# Patient Record
Sex: Female | Born: 1945
Health system: Southern US, Community
[De-identification: ages and names within clinical notes are randomized; demographics above are authoritative.]

## PROBLEM LIST (undated history)

## (undated) DIAGNOSIS — K449 Diaphragmatic hernia without obstruction or gangrene: Secondary | ICD-10-CM

## (undated) DIAGNOSIS — M7710 Lateral epicondylitis, unspecified elbow: Secondary | ICD-10-CM

## (undated) DIAGNOSIS — D1803 Hemangioma of intra-abdominal structures: Secondary | ICD-10-CM

## (undated) DIAGNOSIS — Z5189 Encounter for other specified aftercare: Secondary | ICD-10-CM

## (undated) DIAGNOSIS — E785 Hyperlipidemia, unspecified: Secondary | ICD-10-CM

## (undated) DIAGNOSIS — T7840XA Allergy, unspecified, initial encounter: Secondary | ICD-10-CM

## (undated) DIAGNOSIS — S0992XA Unspecified injury of nose, initial encounter: Secondary | ICD-10-CM

## (undated) DIAGNOSIS — I209 Angina pectoris, unspecified: Secondary | ICD-10-CM

## (undated) DIAGNOSIS — Z86010 Personal history of colonic polyps: Secondary | ICD-10-CM

## (undated) DIAGNOSIS — H269 Unspecified cataract: Secondary | ICD-10-CM

## (undated) DIAGNOSIS — K5792 Diverticulitis of intestine, part unspecified, without perforation or abscess without bleeding: Secondary | ICD-10-CM

## (undated) DIAGNOSIS — K219 Gastro-esophageal reflux disease without esophagitis: Secondary | ICD-10-CM

## (undated) DIAGNOSIS — G4733 Obstructive sleep apnea (adult) (pediatric): Secondary | ICD-10-CM

## (undated) DIAGNOSIS — G4730 Sleep apnea, unspecified: Secondary | ICD-10-CM

## (undated) DIAGNOSIS — B2790 Infectious mononucleosis, unspecified without complication: Secondary | ICD-10-CM

## (undated) DIAGNOSIS — Z87898 Personal history of other specified conditions: Secondary | ICD-10-CM

## (undated) DIAGNOSIS — K589 Irritable bowel syndrome without diarrhea: Secondary | ICD-10-CM

## (undated) DIAGNOSIS — I2510 Atherosclerotic heart disease of native coronary artery without angina pectoris: Secondary | ICD-10-CM

## (undated) DIAGNOSIS — Z9889 Other specified postprocedural states: Secondary | ICD-10-CM

## (undated) DIAGNOSIS — W19XXXA Unspecified fall, initial encounter: Secondary | ICD-10-CM

## (undated) DIAGNOSIS — L719 Rosacea, unspecified: Secondary | ICD-10-CM

## (undated) DIAGNOSIS — J309 Allergic rhinitis, unspecified: Secondary | ICD-10-CM

## (undated) DIAGNOSIS — E061 Subacute thyroiditis: Secondary | ICD-10-CM

## (undated) DIAGNOSIS — K8020 Calculus of gallbladder without cholecystitis without obstruction: Secondary | ICD-10-CM

## (undated) DIAGNOSIS — D649 Anemia, unspecified: Secondary | ICD-10-CM

## (undated) DIAGNOSIS — T4145XA Adverse effect of unspecified anesthetic, initial encounter: Secondary | ICD-10-CM

## (undated) DIAGNOSIS — Z8679 Personal history of other diseases of the circulatory system: Secondary | ICD-10-CM

## (undated) DIAGNOSIS — D72819 Decreased white blood cell count, unspecified: Secondary | ICD-10-CM

## (undated) HISTORY — DX: Subacute thyroiditis: E06.1

## (undated) HISTORY — DX: Anemia, unspecified: D64.9

## (undated) HISTORY — DX: Allergic rhinitis, unspecified: J30.9

## (undated) HISTORY — PX: OTHER SURGICAL HISTORY: SHX169

## (undated) HISTORY — DX: Unspecified cataract: H26.9

## (undated) HISTORY — DX: Rosacea, unspecified: L71.9

## (undated) HISTORY — DX: Irritable bowel syndrome, unspecified: K58.9

## (undated) HISTORY — DX: Personal history of other specified conditions: Z87.898

## (undated) HISTORY — DX: Unspecified injury of nose, initial encounter: S09.92XA

## (undated) HISTORY — PX: TONSILLECTOMY: SHX5217

## (undated) HISTORY — PX: DILATION AND CURETTAGE OF UTERUS: SHX78

## (undated) HISTORY — DX: Diaphragmatic hernia without obstruction or gangrene: K44.9

## (undated) HISTORY — DX: Allergy, unspecified, initial encounter: T78.40XA

## (undated) HISTORY — DX: Encounter for other specified aftercare: Z51.89

## (undated) HISTORY — PX: NASAL SEPTOPLASTY W/ TURBINOPLASTY: SHX2070

## (undated) HISTORY — DX: Sleep apnea, unspecified: G47.30

## (undated) HISTORY — PX: REPLACEMENT TOTAL KNEE: SUR1224

## (undated) HISTORY — PX: DENTAL SURGERY: SHX609

## (undated) HISTORY — DX: Other specified postprocedural states: Z98.890

## (undated) HISTORY — DX: Personal history of colonic polyps: Z86.010

## (undated) HISTORY — DX: Diverticulitis of intestine, part unspecified, without perforation or abscess without bleeding: K57.92

## (undated) HISTORY — PX: KNEE ARTHROSCOPY: SUR90

## (undated) HISTORY — DX: Hemangioma of intra-abdominal structures: D18.03

## (undated) HISTORY — DX: Infectious mononucleosis, unspecified without complication: B27.90

## (undated) HISTORY — DX: Personal history of other diseases of the circulatory system: Z86.79

## (undated) HISTORY — PX: ABDOMINAL HYSTERECTOMY: SHX81

## (undated) HISTORY — DX: Gastro-esophageal reflux disease without esophagitis: K21.9

## (undated) HISTORY — DX: Decreased white blood cell count, unspecified: D72.819

## (undated) HISTORY — DX: Hyperlipidemia, unspecified: E78.5

## (undated) HISTORY — DX: Calculus of gallbladder without cholecystitis without obstruction: K80.20

## (undated) HISTORY — DX: Obstructive sleep apnea (adult) (pediatric): G47.33

## (undated) HISTORY — DX: Lateral epicondylitis, unspecified elbow: M77.10

---
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
## 1991-12-06 HISTORY — PX: ABDOMINAL HYSTERECTOMY: SHX81

## 1998-09-12 ENCOUNTER — Ambulatory Visit: Admission: RE | Admit: 1998-09-12 | Discharge: 1998-09-12 | Payer: Self-pay | Admitting: Internal Medicine

## 1999-12-08 ENCOUNTER — Ambulatory Visit: Admission: RE | Admit: 1999-12-08 | Discharge: 1999-12-08 | Payer: Self-pay | Admitting: Otolaryngology

## 2000-03-05 DIAGNOSIS — K449 Diaphragmatic hernia without obstruction or gangrene: Secondary | ICD-10-CM

## 2000-03-05 HISTORY — DX: Diaphragmatic hernia without obstruction or gangrene: K44.9

## 2000-03-22 ENCOUNTER — Encounter (INDEPENDENT_AMBULATORY_CARE_PROVIDER_SITE_OTHER): Payer: Self-pay | Admitting: Specialist

## 2000-03-22 ENCOUNTER — Ambulatory Visit (HOSPITAL_COMMUNITY): Admission: RE | Admit: 2000-03-22 | Discharge: 2000-03-22 | Payer: Self-pay | Admitting: Gastroenterology

## 2000-05-05 HISTORY — PX: NASAL SEPTOPLASTY W/ TURBINOPLASTY: SHX2070

## 2001-02-16 ENCOUNTER — Encounter: Payer: Self-pay | Admitting: Vascular Surgery

## 2001-02-16 ENCOUNTER — Ambulatory Visit (HOSPITAL_COMMUNITY): Admission: RE | Admit: 2001-02-16 | Discharge: 2001-02-17 | Payer: Self-pay | Admitting: Vascular Surgery

## 2001-04-23 ENCOUNTER — Other Ambulatory Visit: Admission: RE | Admit: 2001-04-23 | Discharge: 2001-04-23 | Payer: Self-pay | Admitting: Obstetrics and Gynecology

## 2001-12-05 DIAGNOSIS — Z87898 Personal history of other specified conditions: Secondary | ICD-10-CM

## 2001-12-05 HISTORY — DX: Personal history of other specified conditions: Z87.898

## 2002-04-05 ENCOUNTER — Encounter: Payer: Self-pay | Admitting: Emergency Medicine

## 2002-04-05 ENCOUNTER — Observation Stay (HOSPITAL_COMMUNITY): Admission: EM | Admit: 2002-04-05 | Discharge: 2002-04-06 | Payer: Self-pay | Admitting: Emergency Medicine

## 2002-10-28 ENCOUNTER — Ambulatory Visit (HOSPITAL_COMMUNITY): Admission: RE | Admit: 2002-10-28 | Discharge: 2002-10-28 | Payer: Self-pay | Admitting: *Deleted

## 2003-09-08 ENCOUNTER — Encounter: Payer: Self-pay | Admitting: Otolaryngology

## 2003-09-08 ENCOUNTER — Ambulatory Visit (HOSPITAL_COMMUNITY): Admission: RE | Admit: 2003-09-08 | Discharge: 2003-09-08 | Payer: Self-pay | Admitting: Otolaryngology

## 2004-02-28 ENCOUNTER — Emergency Department (HOSPITAL_COMMUNITY): Admission: AD | Admit: 2004-02-28 | Discharge: 2004-02-28 | Payer: Self-pay | Admitting: Family Medicine

## 2004-10-18 ENCOUNTER — Ambulatory Visit: Payer: Self-pay | Admitting: Internal Medicine

## 2004-12-30 ENCOUNTER — Ambulatory Visit: Payer: Self-pay | Admitting: Internal Medicine

## 2005-03-08 ENCOUNTER — Ambulatory Visit: Payer: Self-pay | Admitting: Internal Medicine

## 2005-05-25 ENCOUNTER — Ambulatory Visit (HOSPITAL_COMMUNITY): Admission: RE | Admit: 2005-05-25 | Discharge: 2005-05-25 | Payer: Self-pay | Admitting: Orthopedic Surgery

## 2005-09-27 ENCOUNTER — Ambulatory Visit: Payer: Self-pay | Admitting: Internal Medicine

## 2005-10-10 ENCOUNTER — Ambulatory Visit: Payer: Self-pay | Admitting: Internal Medicine

## 2005-12-23 ENCOUNTER — Ambulatory Visit: Payer: Self-pay | Admitting: Internal Medicine

## 2005-12-30 ENCOUNTER — Encounter: Admission: RE | Admit: 2005-12-30 | Discharge: 2005-12-30 | Payer: Self-pay | Admitting: Internal Medicine

## 2006-01-17 ENCOUNTER — Ambulatory Visit: Payer: Self-pay | Admitting: Internal Medicine

## 2006-02-17 ENCOUNTER — Encounter: Payer: Self-pay | Admitting: Internal Medicine

## 2006-10-09 ENCOUNTER — Ambulatory Visit: Payer: Self-pay | Admitting: Internal Medicine

## 2006-11-08 ENCOUNTER — Ambulatory Visit: Payer: Self-pay | Admitting: Internal Medicine

## 2006-11-08 LAB — CONVERTED CEMR LAB
BUN: 15 mg/dL (ref 6–23)
Basophils Absolute: 0 10*3/uL (ref 0.0–0.1)
Basophils Relative: 1 % (ref 0.0–1.0)
CO2: 29 meq/L (ref 19–32)
Calcium: 9.1 mg/dL (ref 8.4–10.5)
Chloride: 105 meq/L (ref 96–112)
Creatinine, Ser: 0.8 mg/dL (ref 0.4–1.2)
Eosinophil percent: 3.6 % (ref 0.0–5.0)
Folate: 19.2 ng/mL
GFR calc non Af Amer: 78 mL/min
Glomerular Filtration Rate, Af Am: 94 mL/min/{1.73_m2}
Glucose, Bld: 88 mg/dL (ref 70–99)
HCT: 37.5 % (ref 36.0–46.0)
Hemoglobin: 13 g/dL (ref 12.0–15.0)
Lymphocytes Relative: 40.9 % (ref 12.0–46.0)
MCHC: 34.5 g/dL (ref 30.0–36.0)
MCV: 95.4 fL (ref 78.0–100.0)
Monocytes Absolute: 0.3 10*3/uL (ref 0.2–0.7)
Monocytes Relative: 9 % (ref 3.0–11.0)
Neutro Abs: 1.3 10*3/uL — ABNORMAL LOW (ref 1.4–7.7)
Neutrophils Relative %: 45.5 % (ref 43.0–77.0)
Platelets: 233 10*3/uL (ref 150–400)
Potassium: 4.6 meq/L (ref 3.5–5.1)
RBC: 3.93 M/uL (ref 3.87–5.11)
RDW: 13.2 % (ref 11.5–14.6)
Sodium: 140 meq/L (ref 135–145)
TSH: 2.04 microintl units/mL (ref 0.35–5.50)
Vitamin B-12: 523 pg/mL (ref 211–911)
WBC: 2.9 10*3/uL — ABNORMAL LOW (ref 4.5–10.5)

## 2006-11-15 ENCOUNTER — Ambulatory Visit: Payer: Self-pay | Admitting: Internal Medicine

## 2006-12-05 DIAGNOSIS — Z9889 Other specified postprocedural states: Secondary | ICD-10-CM

## 2006-12-05 HISTORY — PX: BACK SURGERY: SHX140

## 2006-12-05 HISTORY — DX: Other specified postprocedural states: Z98.890

## 2006-12-28 ENCOUNTER — Encounter: Payer: Self-pay | Admitting: Internal Medicine

## 2006-12-28 ENCOUNTER — Ambulatory Visit (HOSPITAL_BASED_OUTPATIENT_CLINIC_OR_DEPARTMENT_OTHER): Admission: RE | Admit: 2006-12-28 | Discharge: 2006-12-28 | Payer: Self-pay | Admitting: Otolaryngology

## 2006-12-31 ENCOUNTER — Ambulatory Visit: Payer: Self-pay | Admitting: Internal Medicine

## 2006-12-31 HISTORY — PX: OTHER SURGICAL HISTORY: SHX169

## 2007-01-02 ENCOUNTER — Encounter: Payer: Self-pay | Admitting: Internal Medicine

## 2007-01-11 ENCOUNTER — Ambulatory Visit: Payer: Self-pay | Admitting: Internal Medicine

## 2007-01-11 LAB — CONVERTED CEMR LAB
Basophils Absolute: 0 10*3/uL (ref 0.0–0.1)
Basophils Relative: 0.8 % (ref 0.0–1.0)
Eosinophils Absolute: 0.1 10*3/uL (ref 0.0–0.6)
Eosinophils Relative: 2.5 % (ref 0.0–5.0)
HCT: 37.5 % (ref 36.0–46.0)
Hemoglobin: 13.2 g/dL (ref 12.0–15.0)
Lymphocytes Relative: 42.4 % (ref 12.0–46.0)
MCHC: 35.4 g/dL (ref 30.0–36.0)
MCV: 92.5 fL (ref 78.0–100.0)
Monocytes Absolute: 0.3 10*3/uL (ref 0.2–0.7)
Monocytes Relative: 9 % (ref 3.0–11.0)
Neutro Abs: 1.4 10*3/uL (ref 1.4–7.7)
Neutrophils Relative %: 45.3 % (ref 43.0–77.0)
Platelets: 217 10*3/uL (ref 150–400)
RBC: 4.05 M/uL (ref 3.87–5.11)
RDW: 13 % (ref 11.5–14.6)
WBC: 3.1 10*3/uL — ABNORMAL LOW (ref 4.5–10.5)

## 2007-02-06 ENCOUNTER — Ambulatory Visit: Payer: Self-pay | Admitting: Internal Medicine

## 2007-04-07 HISTORY — PX: CARDIAC CATHETERIZATION: SHX172

## 2007-08-17 DIAGNOSIS — J309 Allergic rhinitis, unspecified: Secondary | ICD-10-CM | POA: Insufficient documentation

## 2007-08-17 DIAGNOSIS — K219 Gastro-esophageal reflux disease without esophagitis: Secondary | ICD-10-CM | POA: Insufficient documentation

## 2007-08-17 DIAGNOSIS — M19042 Primary osteoarthritis, left hand: Secondary | ICD-10-CM | POA: Insufficient documentation

## 2007-08-17 DIAGNOSIS — M19041 Primary osteoarthritis, right hand: Secondary | ICD-10-CM | POA: Insufficient documentation

## 2007-08-17 DIAGNOSIS — E785 Hyperlipidemia, unspecified: Secondary | ICD-10-CM | POA: Insufficient documentation

## 2007-09-04 ENCOUNTER — Ambulatory Visit: Payer: Self-pay | Admitting: Internal Medicine

## 2007-09-04 DIAGNOSIS — M7710 Lateral epicondylitis, unspecified elbow: Secondary | ICD-10-CM | POA: Insufficient documentation

## 2007-09-05 HISTORY — PX: OTHER SURGICAL HISTORY: SHX169

## 2007-09-21 ENCOUNTER — Telehealth (INDEPENDENT_AMBULATORY_CARE_PROVIDER_SITE_OTHER): Payer: Self-pay | Admitting: *Deleted

## 2007-09-24 ENCOUNTER — Ambulatory Visit: Payer: Self-pay | Admitting: Internal Medicine

## 2007-10-05 ENCOUNTER — Telehealth: Payer: Self-pay | Admitting: Internal Medicine

## 2007-10-08 ENCOUNTER — Telehealth: Payer: Self-pay | Admitting: Internal Medicine

## 2007-10-08 ENCOUNTER — Encounter: Admission: RE | Admit: 2007-10-08 | Discharge: 2007-10-08 | Payer: Self-pay | Admitting: Internal Medicine

## 2007-10-15 ENCOUNTER — Encounter: Payer: Self-pay | Admitting: Internal Medicine

## 2007-10-17 ENCOUNTER — Telehealth: Payer: Self-pay | Admitting: Internal Medicine

## 2007-11-09 HISTORY — PX: TRANSTHORACIC ECHOCARDIOGRAM: SHX275

## 2007-11-09 HISTORY — PX: CARDIOVASCULAR STRESS TEST: SHX262

## 2007-11-16 ENCOUNTER — Encounter: Admission: RE | Admit: 2007-11-16 | Discharge: 2007-11-16 | Payer: Self-pay | Admitting: Cardiovascular Disease

## 2007-11-26 ENCOUNTER — Encounter: Payer: Self-pay | Admitting: Internal Medicine

## 2007-12-03 ENCOUNTER — Ambulatory Visit (HOSPITAL_COMMUNITY): Admission: RE | Admit: 2007-12-03 | Discharge: 2007-12-03 | Payer: Self-pay | Admitting: Cardiovascular Disease

## 2007-12-03 HISTORY — PX: CARDIAC CATHETERIZATION: SHX172

## 2007-12-20 ENCOUNTER — Telehealth: Payer: Self-pay | Admitting: Internal Medicine

## 2008-01-08 ENCOUNTER — Ambulatory Visit: Payer: Self-pay | Admitting: Internal Medicine

## 2008-01-08 ENCOUNTER — Encounter: Admission: RE | Admit: 2008-01-08 | Discharge: 2008-01-08 | Payer: Self-pay | Admitting: Internal Medicine

## 2008-01-08 DIAGNOSIS — R109 Unspecified abdominal pain: Secondary | ICD-10-CM | POA: Insufficient documentation

## 2008-01-08 DIAGNOSIS — R1032 Left lower quadrant pain: Secondary | ICD-10-CM | POA: Insufficient documentation

## 2008-01-08 DIAGNOSIS — Z8679 Personal history of other diseases of the circulatory system: Secondary | ICD-10-CM | POA: Insufficient documentation

## 2008-01-08 DIAGNOSIS — D72819 Decreased white blood cell count, unspecified: Secondary | ICD-10-CM | POA: Insufficient documentation

## 2008-01-08 LAB — CONVERTED CEMR LAB
BUN: 10 mg/dL (ref 6–23)
Basophils Absolute: 0.1 10*3/uL (ref 0.0–0.1)
Basophils Relative: 0.6 % (ref 0.0–1.0)
Blood in Urine, dipstick: NEGATIVE
CO2: 29 meq/L (ref 19–32)
Calcium: 9.3 mg/dL (ref 8.4–10.5)
Chloride: 105 meq/L (ref 96–112)
Creatinine, Ser: 0.7 mg/dL (ref 0.4–1.2)
Eosinophils Absolute: 0 10*3/uL (ref 0.0–0.6)
Eosinophils Relative: 0.5 % (ref 0.0–5.0)
GFR calc Af Amer: 109 mL/min
GFR calc non Af Amer: 90 mL/min
Glucose, Bld: 115 mg/dL — ABNORMAL HIGH (ref 70–99)
Glucose, Urine, Semiquant: NEGATIVE
HCT: 34.1 % — ABNORMAL LOW (ref 36.0–46.0)
Hemoglobin: 11.3 g/dL — ABNORMAL LOW (ref 12.0–15.0)
Lymphocytes Relative: 9.5 % — ABNORMAL LOW (ref 12.0–46.0)
MCHC: 33.1 g/dL (ref 30.0–36.0)
MCV: 88.4 fL (ref 78.0–100.0)
Monocytes Absolute: 0.7 10*3/uL (ref 0.2–0.7)
Monocytes Relative: 8.4 % (ref 3.0–11.0)
Neutro Abs: 7 10*3/uL (ref 1.4–7.7)
Neutrophils Relative %: 81 % — ABNORMAL HIGH (ref 43.0–77.0)
Nitrite: NEGATIVE
Platelets: 312 10*3/uL (ref 150–400)
Potassium: 4.4 meq/L (ref 3.5–5.1)
RBC: 3.86 M/uL — ABNORMAL LOW (ref 3.87–5.11)
RDW: 13.9 % (ref 11.5–14.6)
Sodium: 140 meq/L (ref 135–145)
Specific Gravity, Urine: 1.015
Urobilinogen, UA: 0.2
WBC Urine, dipstick: NEGATIVE
WBC: 8.6 10*3/uL (ref 4.5–10.5)
pH: 8.5

## 2008-01-09 ENCOUNTER — Telehealth: Payer: Self-pay | Admitting: Internal Medicine

## 2008-01-10 ENCOUNTER — Telehealth: Payer: Self-pay | Admitting: Internal Medicine

## 2008-01-14 ENCOUNTER — Encounter: Payer: Self-pay | Admitting: Internal Medicine

## 2008-02-06 ENCOUNTER — Inpatient Hospital Stay (HOSPITAL_COMMUNITY): Admission: RE | Admit: 2008-02-06 | Discharge: 2008-02-08 | Payer: Self-pay | Admitting: Neurosurgery

## 2008-02-06 HISTORY — PX: BACK SURGERY: SHX140

## 2008-02-12 ENCOUNTER — Encounter: Payer: Self-pay | Admitting: Internal Medicine

## 2008-02-13 ENCOUNTER — Telehealth: Payer: Self-pay | Admitting: Internal Medicine

## 2008-02-15 ENCOUNTER — Telehealth: Payer: Self-pay | Admitting: Internal Medicine

## 2008-02-26 ENCOUNTER — Encounter: Payer: Self-pay | Admitting: Internal Medicine

## 2008-03-12 ENCOUNTER — Encounter: Payer: Self-pay | Admitting: Internal Medicine

## 2008-03-20 ENCOUNTER — Other Ambulatory Visit: Admission: RE | Admit: 2008-03-20 | Discharge: 2008-03-20 | Payer: Self-pay | Admitting: Obstetrics & Gynecology

## 2008-04-01 ENCOUNTER — Encounter: Admission: RE | Admit: 2008-04-01 | Discharge: 2008-04-01 | Payer: Self-pay | Admitting: Gastroenterology

## 2008-04-09 ENCOUNTER — Encounter: Payer: Self-pay | Admitting: Internal Medicine

## 2008-04-10 ENCOUNTER — Telehealth: Payer: Self-pay | Admitting: Internal Medicine

## 2008-04-23 ENCOUNTER — Encounter: Payer: Self-pay | Admitting: Internal Medicine

## 2008-04-24 ENCOUNTER — Encounter: Payer: Self-pay | Admitting: Internal Medicine

## 2008-04-25 ENCOUNTER — Encounter: Payer: Self-pay | Admitting: Internal Medicine

## 2008-05-01 ENCOUNTER — Ambulatory Visit: Payer: Self-pay | Admitting: Internal Medicine

## 2008-05-01 DIAGNOSIS — M79609 Pain in unspecified limb: Secondary | ICD-10-CM | POA: Insufficient documentation

## 2008-05-26 ENCOUNTER — Telehealth: Payer: Self-pay | Admitting: Internal Medicine

## 2008-06-05 ENCOUNTER — Encounter: Payer: Self-pay | Admitting: Internal Medicine

## 2008-06-05 DIAGNOSIS — Z860101 Personal history of adenomatous and serrated colon polyps: Secondary | ICD-10-CM

## 2008-06-05 DIAGNOSIS — Z86010 Personal history of colonic polyps: Secondary | ICD-10-CM

## 2008-06-05 HISTORY — DX: Personal history of adenomatous and serrated colon polyps: Z86.0101

## 2008-06-05 HISTORY — DX: Personal history of colonic polyps: Z86.010

## 2008-06-09 ENCOUNTER — Encounter: Payer: Self-pay | Admitting: Internal Medicine

## 2008-06-09 ENCOUNTER — Encounter: Admission: RE | Admit: 2008-06-09 | Discharge: 2008-06-09 | Payer: Self-pay | Admitting: Gastroenterology

## 2008-06-09 DIAGNOSIS — D1803 Hemangioma of intra-abdominal structures: Secondary | ICD-10-CM

## 2008-06-09 DIAGNOSIS — K8020 Calculus of gallbladder without cholecystitis without obstruction: Secondary | ICD-10-CM

## 2008-06-09 HISTORY — DX: Hemangioma of intra-abdominal structures: D18.03

## 2008-06-09 HISTORY — DX: Calculus of gallbladder without cholecystitis without obstruction: K80.20

## 2008-06-16 ENCOUNTER — Encounter: Payer: Self-pay | Admitting: Internal Medicine

## 2008-06-20 ENCOUNTER — Encounter: Payer: Self-pay | Admitting: Internal Medicine

## 2008-08-15 ENCOUNTER — Telehealth: Payer: Self-pay | Admitting: Internal Medicine

## 2008-08-18 ENCOUNTER — Encounter: Payer: Self-pay | Admitting: *Deleted

## 2008-11-11 ENCOUNTER — Ambulatory Visit: Payer: Self-pay | Admitting: Internal Medicine

## 2008-11-19 ENCOUNTER — Ambulatory Visit: Payer: Self-pay | Admitting: Internal Medicine

## 2008-11-19 LAB — CONVERTED CEMR LAB
Bilirubin Urine: NEGATIVE
Blood in Urine, dipstick: NEGATIVE
Glucose, Urine, Semiquant: NEGATIVE
Ketones, urine, test strip: NEGATIVE
Nitrite: NEGATIVE
Specific Gravity, Urine: 1.02
Urobilinogen, UA: 0.2
Vit D, 1,25-Dihydroxy: 37 (ref 30–89)
WBC Urine, dipstick: NEGATIVE
pH: 7.5

## 2008-11-24 ENCOUNTER — Encounter: Admission: RE | Admit: 2008-11-24 | Discharge: 2008-11-24 | Payer: Self-pay | Admitting: Internal Medicine

## 2008-11-26 ENCOUNTER — Ambulatory Visit: Payer: Self-pay | Admitting: Internal Medicine

## 2008-11-26 LAB — CONVERTED CEMR LAB
ALT: 26 units/L (ref 0–35)
AST: 36 units/L (ref 0–37)
Albumin: 3.9 g/dL (ref 3.5–5.2)
Alkaline Phosphatase: 86 units/L (ref 39–117)
BUN: 21 mg/dL (ref 6–23)
Basophils Absolute: 0 10*3/uL (ref 0.0–0.1)
Basophils Relative: 0.8 % (ref 0.0–3.0)
Bilirubin, Direct: 0.1 mg/dL (ref 0.0–0.3)
CO2: 30 meq/L (ref 19–32)
CRP, High Sensitivity: <1 — ABNORMAL LOW (ref 0.00–5.00)
Calcium: 8.8 mg/dL (ref 8.4–10.5)
Chloride: 105 meq/L (ref 96–112)
Cholesterol: 166 mg/dL (ref 0–200)
Creatinine, Ser: 0.8 mg/dL (ref 0.4–1.2)
Eosinophils Absolute: 0.1 10*3/uL (ref 0.0–0.7)
Eosinophils Relative: 2.6 % (ref 0.0–5.0)
GFR calc Af Amer: 93 mL/min
GFR calc non Af Amer: 77 mL/min
Glucose, Bld: 114 mg/dL — ABNORMAL HIGH (ref 70–99)
HCT: 31.1 % — ABNORMAL LOW (ref 36.0–46.0)
HDL: 85.3 mg/dL (ref >39.0)
Hemoglobin: 10.4 g/dL — ABNORMAL LOW (ref 12.0–15.0)
LDL Cholesterol: 69 mg/dL (ref 0–99)
Lymphocytes Relative: 24 % (ref 12.0–46.0)
MCHC: 33.6 g/dL (ref 30.0–36.0)
MCV: 85.8 fL (ref 78.0–100.0)
Monocytes Absolute: 0.4 10*3/uL (ref 0.1–1.0)
Monocytes Relative: 10.3 % (ref 3.0–12.0)
Neutro Abs: 2.2 10*3/uL (ref 1.4–7.7)
Neutrophils Relative %: 62.3 % (ref 43.0–77.0)
Platelets: 216 10*3/uL (ref 150–400)
Potassium: 3.9 meq/L (ref 3.5–5.1)
RBC: 3.62 M/uL — ABNORMAL LOW (ref 3.87–5.11)
RDW: 15.6 % — ABNORMAL HIGH (ref 11.5–14.6)
Sodium: 141 meq/L (ref 135–145)
TSH: 1.18 microintl units/mL (ref 0.35–5.50)
Total Bilirubin: 0.6 mg/dL (ref 0.3–1.2)
Total CHOL/HDL Ratio: 1.9
Total Protein: 6.3 g/dL (ref 6.0–8.3)
Triglycerides: 59 mg/dL (ref 0–149)
VLDL: 12 mg/dL (ref 0–40)
WBC: 3.6 10*3/uL — ABNORMAL LOW (ref 4.5–10.5)

## 2008-12-02 ENCOUNTER — Encounter: Payer: Self-pay | Admitting: Internal Medicine

## 2008-12-15 ENCOUNTER — Ambulatory Visit: Payer: Self-pay | Admitting: Internal Medicine

## 2008-12-15 DIAGNOSIS — D649 Anemia, unspecified: Secondary | ICD-10-CM | POA: Insufficient documentation

## 2009-01-13 ENCOUNTER — Encounter: Payer: Self-pay | Admitting: Internal Medicine

## 2009-01-15 ENCOUNTER — Encounter: Payer: Self-pay | Admitting: Internal Medicine

## 2009-03-16 ENCOUNTER — Encounter: Payer: Self-pay | Admitting: Internal Medicine

## 2009-03-26 ENCOUNTER — Ambulatory Visit: Payer: Self-pay | Admitting: Internal Medicine

## 2009-03-30 LAB — CONVERTED CEMR LAB
Basophils Absolute: 0 10*3/uL (ref 0.0–0.1)
Basophils Relative: 1.2 % (ref 0.0–3.0)
Eosinophils Absolute: 0.1 10*3/uL (ref 0.0–0.7)
Eosinophils Relative: 3.1 % (ref 0.0–5.0)
Ferritin: 18.2 ng/mL (ref 10.0–291.0)
HCT: 38.6 % (ref 36.0–46.0)
Hemoglobin: 13.2 g/dL (ref 12.0–15.0)
Lymphocytes Relative: 36.6 % (ref 12.0–46.0)
Lymphs Abs: 1.2 10*3/uL (ref 0.7–4.0)
MCHC: 34.3 g/dL (ref 30.0–36.0)
MCV: 94.8 fL (ref 78.0–100.0)
Monocytes Absolute: 0.3 10*3/uL (ref 0.1–1.0)
Monocytes Relative: 8.6 % (ref 3.0–12.0)
Neutro Abs: 1.7 10*3/uL (ref 1.4–7.7)
Neutrophils Relative %: 50.5 % (ref 43.0–77.0)
Platelets: 209 10*3/uL (ref 150.0–400.0)
RBC: 4.07 M/uL (ref 3.87–5.11)
RDW: 15.1 % — ABNORMAL HIGH (ref 11.5–14.6)
WBC: 3.3 10*3/uL — ABNORMAL LOW (ref 4.5–10.5)

## 2009-06-10 ENCOUNTER — Encounter: Payer: Self-pay | Admitting: Internal Medicine

## 2009-06-19 ENCOUNTER — Encounter: Admission: RE | Admit: 2009-06-19 | Discharge: 2009-06-19 | Payer: Self-pay | Admitting: Neurosurgery

## 2009-06-24 ENCOUNTER — Encounter: Payer: Self-pay | Admitting: Internal Medicine

## 2009-09-01 ENCOUNTER — Encounter: Payer: Self-pay | Admitting: Internal Medicine

## 2009-09-09 ENCOUNTER — Ambulatory Visit: Payer: Self-pay | Admitting: Internal Medicine

## 2009-09-09 DIAGNOSIS — G4733 Obstructive sleep apnea (adult) (pediatric): Secondary | ICD-10-CM | POA: Insufficient documentation

## 2009-10-12 ENCOUNTER — Telehealth: Payer: Self-pay | Admitting: Internal Medicine

## 2009-10-15 ENCOUNTER — Ambulatory Visit: Payer: Self-pay | Admitting: Internal Medicine

## 2009-10-15 LAB — CONVERTED CEMR LAB
ALT: 30 units/L (ref 0–35)
AST: 36 units/L (ref 0–37)
Albumin: 4.3 g/dL (ref 3.5–5.2)
Alkaline Phosphatase: 82 units/L (ref 39–117)
BUN: 19 mg/dL (ref 6–23)
Basophils Absolute: 0 10*3/uL (ref 0.0–0.1)
Basophils Relative: 1.3 % (ref 0.0–3.0)
Bilirubin, Direct: 0.1 mg/dL (ref 0.0–0.3)
CO2: 32 meq/L (ref 19–32)
Calcium: 9.4 mg/dL (ref 8.4–10.5)
Chloride: 105 meq/L (ref 96–112)
Cholesterol: 143 mg/dL (ref 0–200)
Creatinine, Ser: 0.8 mg/dL (ref 0.4–1.2)
Eosinophils Absolute: 0.1 10*3/uL (ref 0.0–0.7)
Eosinophils Relative: 5.1 % — ABNORMAL HIGH (ref 0.0–5.0)
Ferritin: 31.9 ng/mL (ref 10.0–291.0)
GFR calc non Af Amer: 76.58 mL/min (ref >60)
Glucose, Bld: 116 mg/dL — ABNORMAL HIGH (ref 70–99)
HCT: 37.9 % (ref 36.0–46.0)
HDL: 63.4 mg/dL (ref >39.00)
Hemoglobin: 13.2 g/dL (ref 12.0–15.0)
Iron: 76 ug/dL (ref 42–145)
LDL Cholesterol: 69 mg/dL (ref 0–99)
Lymphocytes Relative: 34.5 % (ref 12.0–46.0)
Lymphs Abs: 1 10*3/uL (ref 0.7–4.0)
MCHC: 34.8 g/dL (ref 30.0–36.0)
MCV: 99.3 fL (ref 78.0–100.0)
Monocytes Absolute: 0.2 10*3/uL (ref 0.1–1.0)
Monocytes Relative: 7.4 % (ref 3.0–12.0)
Neutro Abs: 1.6 10*3/uL (ref 1.4–7.7)
Neutrophils Relative %: 51.7 % (ref 43.0–77.0)
Platelets: 199 10*3/uL (ref 150.0–400.0)
Potassium: 4.5 meq/L (ref 3.5–5.1)
RBC: 3.82 M/uL — ABNORMAL LOW (ref 3.87–5.11)
RDW: 13 % (ref 11.5–14.6)
Saturation Ratios: 21 % (ref 20.0–50.0)
Sodium: 145 meq/L (ref 135–145)
TSH: 1.02 microintl units/mL (ref 0.35–5.50)
Total Bilirubin: 0.9 mg/dL (ref 0.3–1.2)
Total CHOL/HDL Ratio: 2
Total Protein: 6.7 g/dL (ref 6.0–8.3)
Transferrin: 258.5 mg/dL (ref 212.0–360.0)
Triglycerides: 55 mg/dL (ref 0.0–149.0)
VLDL: 11 mg/dL (ref 0.0–40.0)
WBC: 2.9 10*3/uL — ABNORMAL LOW (ref 4.5–10.5)

## 2009-10-21 ENCOUNTER — Encounter: Admission: RE | Admit: 2009-10-21 | Discharge: 2009-10-21 | Payer: Self-pay | Admitting: Internal Medicine

## 2009-10-26 ENCOUNTER — Ambulatory Visit: Payer: Self-pay | Admitting: Internal Medicine

## 2009-11-16 ENCOUNTER — Encounter (INDEPENDENT_AMBULATORY_CARE_PROVIDER_SITE_OTHER): Payer: Self-pay | Admitting: *Deleted

## 2009-12-05 LAB — HM COLONOSCOPY

## 2009-12-25 ENCOUNTER — Telehealth: Payer: Self-pay | Admitting: Internal Medicine

## 2010-01-19 ENCOUNTER — Encounter: Payer: Self-pay | Admitting: Internal Medicine

## 2010-01-19 LAB — HM MAMMOGRAPHY: HM Mammogram: NORMAL

## 2010-01-25 ENCOUNTER — Encounter: Payer: Self-pay | Admitting: Internal Medicine

## 2010-02-03 ENCOUNTER — Encounter: Payer: Self-pay | Admitting: Internal Medicine

## 2010-02-07 ENCOUNTER — Encounter: Payer: Self-pay | Admitting: Internal Medicine

## 2010-03-23 ENCOUNTER — Ambulatory Visit: Payer: Self-pay | Admitting: Internal Medicine

## 2010-04-06 ENCOUNTER — Encounter: Payer: Self-pay | Admitting: Internal Medicine

## 2010-04-09 ENCOUNTER — Encounter: Payer: Self-pay | Admitting: Internal Medicine

## 2010-04-15 ENCOUNTER — Ambulatory Visit: Payer: Self-pay | Admitting: Internal Medicine

## 2010-04-15 LAB — CONVERTED CEMR LAB
Basophils Absolute: 0 10*3/uL (ref 0.0–0.1)
Basophils Relative: 0.2 % (ref 0.0–3.0)
Eosinophils Absolute: 0 10*3/uL (ref 0.0–0.7)
Eosinophils Relative: 0.1 % (ref 0.0–5.0)
HCT: 37 % (ref 36.0–46.0)
Hemoglobin: 13.1 g/dL (ref 12.0–15.0)
Hgb A1c MFr Bld: 5.4 % (ref 4.6–6.5)
Lymphocytes Relative: 13.7 % (ref 12.0–46.0)
Lymphs Abs: 0.9 10*3/uL (ref 0.7–4.0)
MCHC: 35.4 g/dL (ref 30.0–36.0)
MCV: 96.6 fL (ref 78.0–100.0)
Monocytes Absolute: 0.4 10*3/uL (ref 0.1–1.0)
Monocytes Relative: 6.1 % (ref 3.0–12.0)
Neutro Abs: 5 10*3/uL (ref 1.4–7.7)
Neutrophils Relative %: 79.9 % — ABNORMAL HIGH (ref 43.0–77.0)
Platelets: 241 10*3/uL (ref 150.0–400.0)
RBC: 3.84 M/uL — ABNORMAL LOW (ref 3.87–5.11)
RDW: 14 % (ref 11.5–14.6)
WBC: 6.3 10*3/uL (ref 4.5–10.5)

## 2010-04-16 ENCOUNTER — Encounter: Payer: Self-pay | Admitting: Internal Medicine

## 2010-04-26 ENCOUNTER — Ambulatory Visit: Payer: Self-pay | Admitting: Internal Medicine

## 2010-04-26 DIAGNOSIS — L719 Rosacea, unspecified: Secondary | ICD-10-CM | POA: Insufficient documentation

## 2010-05-05 ENCOUNTER — Telehealth: Payer: Self-pay | Admitting: *Deleted

## 2010-05-25 ENCOUNTER — Encounter: Payer: Self-pay | Admitting: Internal Medicine

## 2010-05-31 ENCOUNTER — Telehealth: Payer: Self-pay | Admitting: Internal Medicine

## 2010-07-21 ENCOUNTER — Encounter: Payer: Self-pay | Admitting: Internal Medicine

## 2010-08-25 ENCOUNTER — Ambulatory Visit (HOSPITAL_BASED_OUTPATIENT_CLINIC_OR_DEPARTMENT_OTHER): Admission: RE | Admit: 2010-08-25 | Discharge: 2010-08-25 | Payer: Self-pay | Admitting: Orthopedic Surgery

## 2010-09-13 ENCOUNTER — Encounter: Payer: Self-pay | Admitting: Internal Medicine

## 2010-10-05 ENCOUNTER — Ambulatory Visit: Payer: Self-pay | Admitting: Internal Medicine

## 2010-10-05 LAB — CONVERTED CEMR LAB
ALT: 30 units/L (ref 0–35)
AST: 33 units/L (ref 0–37)
Albumin: 4 g/dL (ref 3.5–5.2)
Alkaline Phosphatase: 74 units/L (ref 39–117)
BUN: 25 mg/dL — ABNORMAL HIGH (ref 6–23)
Basophils Absolute: 0 10*3/uL (ref 0.0–0.1)
Basophils Relative: 0.9 % (ref 0.0–3.0)
Bilirubin Urine: NEGATIVE
Bilirubin, Direct: 0.1 mg/dL (ref 0.0–0.3)
Blood in Urine, dipstick: NEGATIVE
CO2: 31 meq/L (ref 19–32)
Calcium: 9.2 mg/dL (ref 8.4–10.5)
Chloride: 104 meq/L (ref 96–112)
Cholesterol: 161 mg/dL (ref 0–200)
Creatinine, Ser: 0.7 mg/dL (ref 0.4–1.2)
Eosinophils Absolute: 0.1 10*3/uL (ref 0.0–0.7)
Eosinophils Relative: 3 % (ref 0.0–5.0)
GFR calc non Af Amer: 89.07 mL/min (ref >60)
Glucose, Bld: 90 mg/dL (ref 70–99)
Glucose, Urine, Semiquant: NEGATIVE
HCT: 36.8 % (ref 36.0–46.0)
HDL: 76.4 mg/dL (ref >39.00)
Hemoglobin: 12.6 g/dL (ref 12.0–15.0)
Ketones, urine, test strip: NEGATIVE
LDL Cholesterol: 74 mg/dL (ref 0–99)
Lymphocytes Relative: 44.6 % (ref 12.0–46.0)
Lymphs Abs: 1.4 10*3/uL (ref 0.7–4.0)
MCHC: 34.2 g/dL (ref 30.0–36.0)
MCV: 97.5 fL (ref 78.0–100.0)
Monocytes Absolute: 0.3 10*3/uL (ref 0.1–1.0)
Monocytes Relative: 9.4 % (ref 3.0–12.0)
Neutro Abs: 1.3 10*3/uL — ABNORMAL LOW (ref 1.4–7.7)
Neutrophils Relative %: 42.1 % — ABNORMAL LOW (ref 43.0–77.0)
Nitrite: NEGATIVE
Platelets: 211 10*3/uL (ref 150.0–400.0)
Potassium: 4.8 meq/L (ref 3.5–5.1)
Protein, U semiquant: NEGATIVE
RBC: 3.78 M/uL — ABNORMAL LOW (ref 3.87–5.11)
RDW: 14.1 % (ref 11.5–14.6)
Sodium: 141 meq/L (ref 135–145)
Specific Gravity, Urine: 1.02
TSH: 1.24 microintl units/mL (ref 0.35–5.50)
Total Bilirubin: 0.6 mg/dL (ref 0.3–1.2)
Total CHOL/HDL Ratio: 2
Total Protein: 6.3 g/dL (ref 6.0–8.3)
Triglycerides: 55 mg/dL (ref 0.0–149.0)
Urobilinogen, UA: 0.2
VLDL: 11 mg/dL (ref 0.0–40.0)
WBC Urine, dipstick: NEGATIVE
WBC: 3.1 10*3/uL — ABNORMAL LOW (ref 4.5–10.5)
pH: 7.5

## 2010-10-12 ENCOUNTER — Ambulatory Visit: Payer: Self-pay | Admitting: Internal Medicine

## 2010-12-05 DIAGNOSIS — T4145XA Adverse effect of unspecified anesthetic, initial encounter: Secondary | ICD-10-CM

## 2010-12-05 DIAGNOSIS — T8859XA Other complications of anesthesia, initial encounter: Secondary | ICD-10-CM

## 2010-12-05 HISTORY — PX: REPLACEMENT TOTAL KNEE: SUR1224

## 2010-12-05 HISTORY — DX: Other complications of anesthesia, initial encounter: T88.59XA

## 2010-12-05 HISTORY — DX: Adverse effect of unspecified anesthetic, initial encounter: T41.45XA

## 2010-12-31 ENCOUNTER — Telehealth: Payer: Self-pay | Admitting: *Deleted

## 2011-01-02 LAB — CONVERTED CEMR LAB
Albumin ELP: 63.2 % (ref 55.8–66.1)
Alpha-1-Globulin: 4.1 % (ref 2.9–4.9)
Alpha-2-Globulin: 10.3 % (ref 7.1–11.8)
Basophils Absolute: 0 10*3/uL (ref 0.0–0.1)
Basophils Relative: 1 % (ref 0.0–3.0)
Beta Globulin: 7.3 % — ABNORMAL HIGH (ref 4.7–7.2)
Eosinophils Absolute: 0.1 10*3/uL (ref 0.0–0.7)
Eosinophils Relative: 3 % (ref 0.0–5.0)
Ferritin: 7.6 ng/mL — ABNORMAL LOW (ref 10.0–291.0)
Gamma Globulin: 10.5 % — ABNORMAL LOW (ref 11.1–18.8)
HCT: 30.8 % — ABNORMAL LOW (ref 36.0–46.0)
Hemoglobin: 10.3 g/dL — ABNORMAL LOW (ref 12.0–15.0)
Hgb A1c MFr Bld: 6 % (ref 4.6–6.0)
Lymphocytes Relative: 37.3 % (ref 12.0–46.0)
MCHC: 33.3 g/dL (ref 30.0–36.0)
MCV: 86.1 fL (ref 78.0–100.0)
Monocytes Absolute: 0.3 10*3/uL (ref 0.1–1.0)
Monocytes Relative: 10 % (ref 3.0–12.0)
Neutro Abs: 1.7 10*3/uL (ref 1.4–7.7)
Neutrophils Relative %: 48.7 % (ref 43.0–77.0)
Platelets: 251 10*3/uL (ref 150–400)
RBC: 3.58 M/uL — ABNORMAL LOW (ref 3.87–5.11)
RDW: 15.5 % — ABNORMAL HIGH (ref 11.5–14.6)
Total Protein, Serum Electrophoresis: 6.6 g/dL (ref 6.0–8.3)
Vitamin B-12: 1228 pg/mL — ABNORMAL HIGH (ref 211–911)
WBC: 3.3 10*3/uL — ABNORMAL LOW (ref 4.5–10.5)

## 2011-01-04 NOTE — Consult Note (Signed)
Summary: Dr Kinnie Scales colonoscopy /polypectomy   Dr Kinnie Scales note   Imported By: Kassie Mends 06/18/2008 10:25:51  _____________________________________________________________________  External Attachment:    Type:   Image     Comment:   Dr Kinnie Scales note

## 2011-01-04 NOTE — Progress Notes (Signed)
Summary: Doing much better  Phone Note Call from Patient Call back at Home Phone [phone]   Caller: Patient Summary of Call: Pt called to say Thank You and that she is feeling much better. Initial call taken by: Romualdo Bolk, CMA,  January 10, 2008 11:12 AM

## 2011-01-04 NOTE — Progress Notes (Signed)
Summary: Pt needs Valium for MRI  ---- Converted from flag ---- ---- 10/08/2007 9:32 AM, Benancio Deeds [place] wrote: Patient scheduled for MRI tonight. [facility] [phone] ------------------------------  Phone Note Call from Patient Call back at Home Phone [phone]   Caller: patient and pharmacy Summary of Call: Pt wants valium called in to take before MRI tonight. Pharmacy faxed over a request for hydrocodone 7.5/500. Initial call taken by: Romualdo Bolk, CMA,  October 08, 2007 2:37 PM  Follow-up for Phone Call        Per md ok to refill Hydrococne-apap 7.5/500 x 2 and do ativan 1 mg #6 take 1 tablet pre-procedure. Pt aware that we are going to call these in. Follow-up by: Romualdo Bolk, CMA,  October 08, 2007 2:38 PM    New/Updated Medications: ATIVAN 1 MG  TABS (LORAZEPAM) take 1 tablet pre procedure   Prescriptions: ATIVAN 1 MG  TABS (LORAZEPAM) take 1 tablet pre procedure  #6 x 0   Entered by:   Romualdo Bolk, CMA   Authorized by:   Madelin Headings MD   Signed by:   Romualdo Bolk, CMA on 10/08/2007   Method used:   Telephoned to ...       [facility] [address] [phone]*       [address]       [place], [place]  [address]       Ph: [phone] or [phone]       Fax: [phone]   RxID:   [id number] LORTAB 7.5 7.5-500 MG  TABS (HYDROCODONE-ACETAMINOPHEN) 1 by mouth qid as needed  #20 x 1   Entered by:   Romualdo Bolk, CMA   Authorized by:   Madelin Headings MD   Signed by:   Romualdo Bolk, CMA on 10/08/2007   Method used:   Telephoned to ...       [facility] [address] [phone]*       [address]       [place], [place]  [address]       Ph: [phone] or [phone]       Fax: [phone]   RxID:   [id number]

## 2011-01-04 NOTE — Assessment & Plan Note (Signed)
Summary: severe abdominal pain/ssc   Vital Signs:  Patient Profile:   65 Years Old Female Temp:     98.0 degrees F oral Pulse rate:   66 / minute BP sitting:   130 / 70  (left arm) Cuff size:   regular  Vitals Entered By: Romualdo Bolk, CMA (January 08, 2008 3:20 PM)                 Chief Complaint:  LLQ pain.  History of Present Illness: Terri Rodriguez is herefor an acute visit with husbannd  for LLQ pain that started 26 hours ago. Pt felt ok after lunch 01/07/08 but then it started. Pt hasn't had much to eat since then. Pt hasn't taken pain meds in 12 hours. Pt had last BM yesterday afternoon. Pain scale 6 unless she takes a deep breathe 9 or 10. Pain is very sharp at times. Pt had hard chills last night. Pt isn't having a fever,nausea, vomiting or diarrhea. Pt has no appetite. Pt took a tramdol 7am.  Does not think this pain is related to her back predicament.   She has been under NS care and to have surgery for her LS spine disease and radiculopathy and has been on narcotics but not today.   Always has some constipation but went yesterday and no change in pain.  today felt like if she could pass gas it would help but it didn't.   No nause vomiting uti.sx .    No hx BO or diverticultits but is up to date on  colonoscopy and had adenomatous polyp then.    Hx GERD no change    Current Allergies (reviewed today): No known allergies   Past Medical History:    Allergic rhinitis    GERD    Hyperlipidemia    Hypertension    Osteoarthritis, mild    degenerative joint disease    Raynauds        had Cardiac cath in Dec for abn ekg? and normal arteries Dr Tresa Endo  Past Surgical History:    Reviewed history from 08/17/2007 and no changes required:       G4P1  preeclampsia  1982       C-section, D & C  1985, 1989       nasal septoplasty       poly   nonadenomatous       Tonsillectomy  1952       Hysterectomy  1993       Oophorectomy, salpingectomy bilat  1993   Family  History:    cv disease, cancer , dm , breast cancer.   Social History:    Reviewed history from 09/04/2007 and no changes required:       Occupation:physician       Married       Never Smoked    Review of Systems  The patient denies fever, chest pain, syncope, melena, hematochezia, severe indigestion/heartburn, hematuria, and incontinence.         see hpi no vision change   hurts to take deep breath but no cough  or inc gerd.  Rest of ros [place] .   Physical Exam  General:     well-developed, well-nourished, and well-hydrated.in mild to moderate distress   Head:     Normocephalic and atraumatic without obvious abnormalities. No apparent alopecia or balding. Eyes:     no  icteric Ears:     no external deformities.   Nose:  no external deformity and no nasal discharge.   Neck:     No deformities, masses, or tenderness noted.no thyromegaly.   Lungs:     Normal respiratory effort, chest expands symmetrically. Lungs are clear to auscultation, no crackles or wheezes. Heart:     Normal rate and regular rhythm. S1 and S2 normal without gallop,, click, rub or other extra sounds.no JVD and no lifts.   Abdomen:     no masses, no hepatomegaly, no splenomegaly, and bowel sounds hyperactive.  tender llq without mass no G or rebound  pain is localized  no hernia seen.   Msk:     no joint swelling, no joint warmth, and no redness over joints.   Pulses:     ue le iontact  Extremities:     no edema no clubbing or cyanosis Neurologic:     grossly intactalert & oriented X3.  non focal Skin:     turgor normal, color normal, no suspicious lesions, and no ecchymoses.   Cervical Nodes:     no anterior cervical adenopathy and no posterior cervical adenopathy.   Inguinal Nodes:     no R inguinal adenopathy and no L inguinal adenopathy.   Psych:     normally interactive and good eye contact.      Impression & Recommendations:  Problem # 1:  ABDOMINAL PAIN, LEFT LOWER QUADRANT  (ICD-789.04) Zoella's cell [phone] Steve's cell [phone] Prob diverticulitis or other causes of acute abdomen  r/o abscess atypical appendicitis  acute onset and persistent   pt has had abdominal pelvic surgeries but no hx bo or diverticulitis.  I suppose could be related to meds nad constipation bul seems less likely.  Ct scan of abd pelvis today.  call report.  to me  Problem # 2:  BACK PAIN, LUMBAR, WITH RADICULOPATHY 10/08 LFT (ICD-724.4) surgery pending  Her updated medication list for this problem includes:    Aspirin 81 Mg Tbec (Aspirin) ..... Once daily    Celebrex 200 Mg Caps (Celecoxib) .Marland Kitchen... 1 by mouth two times a day    Tramadol Hcl 50 Mg Tabs (Tramadol hcl) .Marland Kitchen... 1 by mouth q 4-6 hours a  day as needed  for pain    Tylenol Extra Strength 500 Mg Tabs (Acetaminophen)    Lortab 7.5 7.5-500 Mg Tabs (Hydrocodone-acetaminophen) .Marland Kitchen... 1 by mouth qid as needed   Problem # 3:  HYPERTENSION (ICD-401.9) Assessment: Comment Only  Her updated medication list for this problem includes:    Altace 10 Mg Tabs (Ramipril) .Marland Kitchen... Take 1 tablet by mouth once a day    Caduet 5-20 Mg Tabs (Amlodipine-atorvastatin) .Marland Kitchen... Take 1 tablet by mouth once a day    Coreg 3.125 Mg Tabs (Carvedilol) .Marland Kitchen... 1 by mouth two times a day   Problem # 4:  GERD (ICD-530.81) Assessment: Comment Only  Her updated medication list for this problem includes:    Nexium 40 Mg Cpdr (Esomeprazole magnesium)    Zegerid 20-1100 Mg Caps (Omeprazole-sodium bicarbonate) .Marland Kitchen... Take 1 capsule by mouth two times a day    Gaviscon Acid Brkthrgh Formula 500 Mg Chew (Calcium carbonate antacid) .Marland Kitchen... As needed   Problem # 5:  LEUKOPENIA, CHRONIC (ICD-288.50) Assessment: Comment Only  Problem # 6:  RAYNAUD'S SYNDROME, HX OF (ICD-V12.59) Assessment: Comment Only  Complete Medication List: 1)  Cymbalta 60 Mg Cpep (Duloxetine hcl) 2)  Nexium 40 Mg Cpdr (Esomeprazole magnesium) 3)  Valtrex 1 Gm Tabs (Valacyclovir hcl) 4)  Altace  10 Mg Tabs (Ramipril) .Marland KitchenMarland KitchenMarland Kitchen  Take 1 tablet by mouth once a day 5)  Caduet 5-20 Mg Tabs (Amlodipine-atorvastatin) .... Take 1 tablet by mouth once a day 6)  Aspirin 81 Mg Tbec (Aspirin) .... Once daily 7)  Zegerid 20-1100 Mg Caps (Omeprazole-sodium bicarbonate) .... Take 1 capsule by mouth two times a day 8)  Gaviscon Acid Brkthrgh Formula 500 Mg Chew (Calcium carbonate antacid) .... As needed 9)  Vivelle-dot 0.0375 Mg/24hr Pttw (Estradiol) .... Change every 4 days 10)  Multivitamins Tabs (Multiple vitamin) 11)  Caltrate 600+d 600-400 Mg-unit Tabs (Calcium carbonate-vitamin d) 12)  Folic Acid 400 Mcg Tabs (Folic acid) 13)  Rhinocort Aqua 32 Mcg/act Susp (Budesonide (nasal)) .... As needed 14)  Claritin 10 Mg Tabs (Loratadine) .... As needed 15)  Metrogel-vaginal 0.75 % Gel (Metronidazole) .... Two times a day 16)  Finacea 15 % Gel (Azelaic acid) .... Two times a day 17)  Nitroquick 0.4 Mg Subl (Nitroglycerin) .... As needed 18)  Celebrex 200 Mg Caps (Celecoxib) .Marland Kitchen.. 1 by mouth two times a day 19)  Tramadol Hcl 50 Mg Tabs (Tramadol hcl) .Marland Kitchen.. 1 by mouth q 4-6 hours a  day as needed  for pain 20)  Tylenol Extra Strength 500 Mg Tabs (Acetaminophen) 21)  Lortab 7.5 7.5-500 Mg Tabs (Hydrocodone-acetaminophen) .Marland Kitchen.. 1 by mouth qid as needed 22)  Ativan 1 Mg Tabs (Lorazepam) .... Take 1 tablet pre procedure 23)  Coreg 3.125 Mg Tabs (Carvedilol) .Marland Kitchen.. 1 by mouth two times a day 24)  Vivelle-dot 0.05 Mg/24hr Pttw (Estradiol) .... Alt with .0375mg  25)  Metronidazole 500 Mg Tabs (Metronidazole) .Marland Kitchen.. 1 by mouth three times a day 26)  Cipro 500 Mg Tabs (Ciprofloxacin hcl) .Marland Kitchen.. 1 by mouth two times a day  Other Orders: UA Dipstick w/o Micro ([address]) Venipuncture ([address]) TLB-BMP (Basic Metabolic Panel-BMET) (80048-METABOL) TLB-CBC Platelet - w/Differential (85025-CBCD)   Patient Instructions: 1)  ct abd pelvis today   stat with stat labs.   2)  Then plan follow up  Discuss with pt  ct shows inflammatory  process left colon prob diverticulitis but no tics seen, no abscess or bo, Gallstones and 2x3 cm lesionon dome of liver that needs MRi to further delineate.    Will call in antibiotics and call on Feb 6 about progress . will need to follow up with Gi eventually.  Pt aware . ..................................................................Marland KitchenMadelin Headings MD  January 08, 2008 7:14 PM    Prescriptions: CIPRO 500 MG  TABS (CIPROFLOXACIN HCL) 1 by mouth two times a day  #20 x 1   Entered and Authorized by:   Madelin Headings MD   Signed by:   Madelin Headings MD on 01/08/2008   Method used:   Electronically sent to ...       [facility] [address] [phone]*       [address]       [place], [place]  [address]       Ph: [phone] or [phone]       Fax: [phone]   RxID:   [id number] METRONIDAZOLE 500 MG  TABS (METRONIDAZOLE) 1 by mouth three times a day  #30 x 1   Entered and Authorized by:   Madelin Headings MD   Signed by:   Madelin Headings MD on 01/08/2008   Method used:   Electronically sent to ...       [facility] [address] [phone]*  [address]       [place], [place]  [address]       Ph: [phone] or [phone]       Fax: [phone]   RxID:   [id number]  ] Laboratory Results   Urine Tests    Routine Urinalysis   Color: yellow Appearance: Clear Glucose: negative   (Normal Range: Negative) Bilirubin: 1+   (Normal Range: Negative) Ketone: 2+   (Normal Range: Negative) Spec. Gravity: 1.015   (Normal Range: 1.003-1.035) Blood: negative   (Normal Range: Negative) pH: 8.5   (Normal Range: 5.0-8.0) Protein: 1+   (Normal Range: Negative) Urobilinogen: 0.2   (Normal Range: 0-1) Nitrite: negative   (Normal Range: Negative) Leukocyte Esterace: negative   (Normal Range: Negative)    Comments: ...................................................................Milica Zimonjic  January 08, 2008 5:33 PM

## 2011-01-04 NOTE — Letter (Signed)
Summary: [facility]  [facility]   Imported By: Maryln Gottron 09/28/2010 10:06:49  _____________________________________________________________________  External Attachment:    Type:   Image     Comment:   External Document

## 2011-01-04 NOTE — Consult Note (Signed)
Summary: Dr Kinnie Scales note  Dr Kinnie Scales note   Imported By: Kassie Mends 05/07/2008 16:07:46  _____________________________________________________________________  External Attachment:    Type:   Image     Comment:   Dr Kinnie Scales note

## 2011-01-04 NOTE — Progress Notes (Signed)
Summary: rx for zegrid otc  Phone Note Call from Patient Call back at Home Phone [phone]   Caller: Patient Summary of Call: Pt has been taking zegreid otc 2 once daily. Pt needs a rx to have on file so she can use her flex spending. Can we do this? Initial call taken by: Romualdo Bolk, CMA Duncan Dull),  December 25, 2009 2:07 PM  Follow-up for Phone Call        Pt aware and wants rx mailed to her. She would like a 90 days written and taken to the pharmacy. Rx mailed to pt. Follow-up by: Romualdo Bolk, CMA (AAMA),  December 25, 2009 3:45 PM    New/Updated Medications: ZEGERID OTC 20-1100 MG CAPS (OMEPRAZOLE-SODIUM BICARBONATE) 2 by mouth once daily Prescriptions: ZEGERID OTC 20-1100 MG CAPS (OMEPRAZOLE-SODIUM BICARBONATE) 2 by mouth once daily  #180 x 3   Entered by:   Romualdo Bolk, CMA (AAMA)   Authorized by:   Madelin Headings MD   Signed by:   Romualdo Bolk, CMA (AAMA) on 12/25/2009   Method used:   Print then Mail to Patient   RxID:   [id number]

## 2011-01-04 NOTE — Consult Note (Addendum)
Summary: Dr Newell Coral note  Dr Newell Coral note   Imported By: Kassie Mends 03/11/2008 08:41:47  _____________________________________________________________________  External Attachments:     1. Type:   Image          Comment:   External Document    2. Type:   Image          Comment:   Dr Newell Coral note

## 2011-01-04 NOTE — Assessment & Plan Note (Signed)
Summary: SLEEP PROBLEM/ MBW   Primary Provider/Referring Provider:  Dr Fabian Sharp  CC:  Sleep consult-Dr.Panosh.  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 She had a hx of loud snoring and witnessed apnea. A fist NPSG in 2001 showed RDI 11/hr. She was retested in 2008 with severe OSA, AHI 87/hr. CPAP was titrated to 12 with good control, but she was unable to tolerate CPAP. She did adjust well to long term Autotitration PAP, subsequently altered to a peak pressure of 12. She had used that very successfully every night until 1 month ago, when the machine failed and would no longer regulate pressure. She was not happy with  Am Home Patient. She asks help getting replacement machine and changing DME companies. Nasal pillows mask. Bedtime 12MN-1AM, sleep latencyu 2-45 minutes, waking once before up 830- 930 AM.  Had gained 40lbs while inactive with back pain and has lost 20 lbs since lumbar disc suirgery.  Current Medications (verified): 1)  Cymbalta 60 Mg Cpep (Duloxetine Hcl) .Marland Kitchen.. 1 By Mouth Once Daily 2)  Valtrex 1 Gm Tabs (Valacyclovir Hcl) .... 2 Two Times A Day 3)  Altace 10 Mg  Tabs (Ramipril) .... Take 1 Tablet By Mouth Once A Day 4)  Caduet 5-20 Mg  Tabs (Amlodipine-Atorvastatin) .... Take 1 Tablet By Mouth Once A Day 5)  Aspirin 81 Mg  Tbec (Aspirin) .... Once Daily 6)  Zegerid Otc 20-1100 Mg Caps (Omeprazole-Sodium Bicarbonate) .... 2 By Mouth Once Daily 7)  Gaviscon Acid Brkthrgh Formula 500 Mg  Chew (Calcium Carbonate Antacid) .... As Needed 8)  Vivelle-Dot 0.0375 Mg/24hr  Pttw (Estradiol) .... Change Every 4 Days 9)  Multivitamins   Tabs (Multiple Vitamin) 10)  Caltrate 600+d 600-400 Mg-Unit  Tabs (Calcium Carbonate-Vitamin D) 11)  Rhinocort Aqua 32 Mcg/act  Susp (Budesonide (Nasal)) .... 2 Sprays in Each Nostril Daily 12)  Claritin 10 Mg  Tabs (Loratadine) .... As  Needed 13)  Metrogel 1 % Gel (Metronidazole) 14)  Finacea 15 %  Gel (Azelaic Acid) .... Two Times A Day 15)  Nitroquick 0.4 Mg  Subl (Nitroglycerin) .... As Needed 16)  Celebrex 200 Mg  Caps (Celecoxib) .Marland Kitchen.. 1 By Mouth Two Times A Day 17)  Tramadol Hcl 50 Mg  Tabs (Tramadol Hcl) .Marland Kitchen.. 1 By Mouth Q 4-6 Hours A  Day As Needed  For Pain 18)  Tylenol Extra Strength 500 Mg  Tabs (Acetaminophen) 19)  Ativan 1 Mg  Tabs (Lorazepam) .... Take 1 Tablet Pre Procedure 20)  Coreg 3.125 Mg  Tabs (Carvedilol) .Marland Kitchen.. 1 By Mouth Two Times A Day 21)  Vivelle-Dot 0.05 Mg/24hr  Pttw (Estradiol) .... Alt With .0375mg  22)  Iron 325 (65 Fe) Mg Tabs (Ferrous Sulfate) 23)  Lipitor 20 Mg Tabs (Atorvastatin Calcium) .... Take 1 By Mouth Once Daily 24)  Norvasc 5 Mg Tabs (Amlodipine Besylate) .... Take 1 By Mouth Once Daily 25)  Vitamin D3 2000 Unit Caps (Cholecalciferol) .... Take 1 By Mouth Once Daily  Allergies (verified): No Known Drug Allergies  Past History:  Past Surgical History: Last updated: 05/01/2008 G4P1  preeclampsia  1982 C-section, D & C  1985, 1989 nasal septoplasty poly   nonadenomatous Tonsillectomy  1952 Hysterectomy  1993 Oophorectomy, salpingectomy bilat  1993 lumbar surgery fixation    with disc replacement   Dr Newell Coral   Family History: Last updated: 03/23/2010 cv disease, cancer , dm , breast cancer.  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 and normal arteries Dr Estrella Deeds  Family History: cv disease, cancer , dm , breast cancer.   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  The patient denies shortness of breath with activity, shortness of breath at rest, productive cough, non-productive cough, coughing up blood, chest pain, irregular heartbeats, indigestion, loss of appetite, abdominal pain, difficulty swallowing, sore throat, tooth/dental problems, headaches, nasal congestion/difficulty breathing through nose, sneezing, itching, ear ache, anxiety, depression, hand/feet swelling, joint stiffness or pain, rash, change in color of mucus, and fever.    Vital Signs:  Patient profile:   65 year old female Menstrual status:  hysterectomy Height:      67 inches Weight:      226.25 pounds BMI:     35.56 O2 Sat:      95 % on Room air Pulse rate:   100 / minute BP sitting:   124 / 76  (right arm) Cuff size:   large  Vitals Entered By: Reynaldo Minium CMA (March 23, 2010 4:00 PM)  O2 Flow:  Room air  Physical Exam  Additional Exam:  General: A/Ox3; pleasant and cooperative, NAD, overweight SKIN: no rash, lesions NODES: no lymphadenopathy HEENT: Wallace/AT, EOM- WNL, Conjuctivae- clear, PERRLA, TM-WNL, Nose- clear, Throat- dry oral mucosa, Mallampati  III NECK: Supple w/ fair ROM, JVD- none, normal carotid impulses w/o bruits Thyroid- normal to palpation CHEST: Clear to P&A HEART: RRR, no m/g/r heard ABDOMEN: Soft and nl;  ZHY:QMVH, nl pulses, no edema  NEURO: Grossly intact to  observation      Impression & Recommendations:  Problem # 1:  SLEEP APNEA, OBSTRUCTIVE (ICD-327.23)  Severe OSA. She had done very well with autotitration machine, reset to max at 12 cwp.  We discussed options. She needs a new machine and will also help her set up with a new DME company.  Medications Added to Medication List This Visit: 1)  Lipitor 20 Mg Tabs (Atorvastatin calcium) .... Take 1 by mouth once daily 2)  Norvasc 5 Mg Tabs (Amlodipine besylate) .... Take 1 by mouth once daily 3)  Vitamin D3 2000 Unit Caps (Cholecalciferol) .... Take 1 by mouth once daily  Other Orders: Est. Patient Level III ([address]) DME Referral (DME)  Patient Instructions: 1)  Please schedule a follow-up appointment in 1 year. 2)  See [facility] to get CPAP restarted

## 2011-01-04 NOTE — Progress Notes (Signed)
Summary: Wants MRI  Phone Note Call from Patient Call back at Home Phone [phone]   Caller: Patient Summary of Call: Pt still having Disc Pain, took prednisone and nacrotics. She done better on the prednisone. Pt she still has hip pain and numbness. She wants MRI. Left leg weaker than right. She is back on ultram, celebrex and tylenol. Initial call taken by: Romualdo Bolk, CMA,  October 05, 2007 11:33 AM  Follow-up for Phone Call        per md ok to schedule mri of ls spine dx Back pain w/ radation sx left leg. Pt aware that we are going to go ahead w/ mri. Follow-up by: Romualdo Bolk, CMA,  October 05, 2007 4:36 PM

## 2011-01-04 NOTE — Progress Notes (Signed)
Summary: foot not any better  Phone Note Call from Patient Call back at Home Phone [phone]   Caller: Patient Summary of Call: Pt called saying that her foot isn't any better and wants to know if she should go to Dr. Lequita Halt? Initial call taken by: Romualdo Bolk, CMA,  May 26, 2008 10:26 AM  Follow-up for Phone Call        I told her that would be fine and that I would fax over a copy of the x-ray report. X-ray report faxed electronically. Follow-up by: Romualdo Bolk, CMA,  May 26, 2008 10:26 AM

## 2011-01-04 NOTE — Assessment & Plan Note (Signed)
Summary: FUP ON MEDS-FLU SHOT//CCM   Vital Signs:  Patient profile:   65 year old female Menstrual status:  hysterectomy Height:      67 inches Weight:      240 pounds BMI:     37.73 Pulse rate:   80 / minute BP sitting:   110 / 62  (left arm) Cuff size:   large  Vitals Entered By: Romualdo Bolk, CMA (AAMA) (September 09, 2009 11:00 AM) CC: Follow-up visit on meds, Hypertension Management LMP - Character: hyst     Menstrual Status hysterectomy   History of Present Illness: Terri Rodriguez comesin today for   for follow up of multiple medical problems .  Since last visit  here  there have been no major changes in health status  .  Did see Dr Newell Coral after an episode of back pain .    Rx with PT   .  Car ride hurts to left leg.  Shoulder pain  and Oa change .  HT:  very good  doesnt really have ht and on meds for raynauds LIPID:  on meds  Yeast  intertrigo .   tried   topical otc.  Lotrimin.  Allergy  nares   rhinocort   2 others  had se.  with flonase,  nasacort  .   uses every day  .  2 days off  gets sneezing and coughing. and sinusitis.   Osa : controlled.  Liver  dec 2009   .imaging needs follow up . ? if atypical hemagioma   CV  sees Dr Bishop Limbo  no change  Hard to lose weight with medial problems  OA hands problematic and knees at times.    Hypertension History:      She denies headache, chest pain, palpitations, dyspnea with exertion, orthopnea, PND, peripheral edema, visual symptoms, neurologic problems, syncope, and side effects from treatment.  She notes no problems with any antihypertensive medication side effects.        Positive major cardiovascular risk factors include female age 98 years old or older, hyperlipidemia, and hypertension.  Negative major cardiovascular risk factors include non-tobacco-user status.      Preventive Screening-Counseling & Management  Alcohol-Tobacco     Alcohol drinks/day: 2     Alcohol type: wine     Smoking Status: never   Caffeine-Diet-Exercise     Caffeine use/day: 2     Does Patient Exercise: yes  Current Medications (verified): 1)  Cymbalta 60 Mg Cpep (Duloxetine Hcl) .Marland Kitchen.. 1 By Mouth Once Daily 2)  Valtrex 1 Gm Tabs (Valacyclovir Hcl) 3)  Altace 10 Mg  Tabs (Ramipril) .... Take 1 Tablet By Mouth Once A Day 4)  Caduet 5-20 Mg  Tabs (Amlodipine-Atorvastatin) .... Take 1 Tablet By Mouth Once A Day 5)  Aspirin 81 Mg  Tbec (Aspirin) .... Once Daily 6)  Zegerid 40-1100 Mg Caps (Omeprazole-Sodium Bicarbonate) 7)  Gaviscon Acid Brkthrgh Formula 500 Mg  Chew (Calcium Carbonate Antacid) .... As Needed 8)  Vivelle-Dot 0.0375 Mg/24hr  Pttw (Estradiol) .... Change Every 4 Days 9)  Multivitamins   Tabs (Multiple Vitamin) 10)  Caltrate 600+d 600-400 Mg-Unit  Tabs (Calcium Carbonate-Vitamin D) 11)  Rhinocort Aqua 32 Mcg/act  Susp (Budesonide (Nasal)) .... 2 Sprays in Each Nostril Daily 12)  Claritin 10 Mg  Tabs (Loratadine) .... As Needed 13)  Metrogel 1 % Gel (Metronidazole) 14)  Finacea 15 %  Gel (Azelaic Acid) .... Two Times A Day 15)  Nitroquick 0.4  Mg  Subl (Nitroglycerin) .... As Needed 16)  Celebrex 200 Mg  Caps (Celecoxib) .Marland Kitchen.. 1 By Mouth Two Times A Day 17)  Tramadol Hcl 50 Mg  Tabs (Tramadol Hcl) .Marland Kitchen.. 1 By Mouth Q 4-6 Hours A  Day As Needed  For Pain 18)  Tylenol Extra Strength 500 Mg  Tabs (Acetaminophen) 19)  Ativan 1 Mg  Tabs (Lorazepam) .... Take 1 Tablet Pre Procedure 20)  Coreg 3.125 Mg  Tabs (Carvedilol) .Marland Kitchen.. 1 By Mouth Two Times A Day 21)  Vivelle-Dot 0.05 Mg/24hr  Pttw (Estradiol) .... Alt With .0375mg   Allergies (verified): No Known Drug Allergies  Past History:  Past medical, surgical, family and social histories (including risk factors) reviewed, and no changes noted (except as noted below).  Past Medical History: Reviewed history from 12/15/2008 and no changes required. Allergic rhinitis GERD Hyperlipidemia Hypertension Osteoarthritis, mild degenerative joint disease Raynauds   Cholelithiaisis  had Cardiac cath in Dec for abn ekg? and normal arteries Dr Tresa Endo   CONSULTANTS  Lorelee Cover  Past Surgical History: Reviewed history from 05/01/2008 and no changes required. G4P1  preeclampsia  1982 C-section, D & C  1985, 1989 nasal septoplasty poly   nonadenomatous Tonsillectomy  1952 Hysterectomy  1993 Oophorectomy, salpingectomy bilat  1993 lumbar surgery fixation    with disc replacement   Dr Newell Coral   Past History:  Care Management: Gynecology: Hyacinth Meeker Dermatology: Emily Filbert Cardiology: Tresa Endo Gastroenterology: Kinnie Scales Orthopedics: Despina Hick Neurosurgery: Jule Ser  Family History: Reviewed history from 12/15/2008 and no changes required. cv disease, cancer , dm , breast cancer.    Social History: Reviewed history from 12/15/2008 and no changes required. Occupation:physician Married  Never Smoked Alcohol use-yes social hh of 2  no  petsCaffeine use/day:  2 Does Patient Exercise:  yes  Review of Systems  The patient denies anorexia, fever, weight loss, vision loss, decreased hearing, chest pain, syncope, dyspnea on exertion, peripheral edema, prolonged cough, hemoptysis, melena, hematochezia, severe indigestion/heartburn, hematuria, muscle weakness, difficulty walking, depression, unusual weight change, abnormal bleeding, enlarged lymph nodes, angioedema, and breast masses.    Physical Exam  General:  Well-developed,well-nourished,in no acute distress; alert,appropriate and cooperative throughout examination Head:  normocephalic and atraumatic.   Eyes:  vision grossly intact.   Ears:  R ear normal and L ear normal.   Neck:  No deformities, masses, or tenderness noted. Lungs:  Normal respiratory effort, chest expands symmetrically. Lungs are clear to auscultation, no crackles or wheezes.no dullness.   Heart:  Normal rate and regular rhythm. S1 and S2 normal without gallop, murmur, click, rub or other extra sounds.no lifts.   Abdomen:  Bowel  sounds positive,abdomen soft and non-tender without masses, organomegaly or   noted. Msk:  OA changes hands no joint warmth and no redness over joints.   Pulses:  pulses intact without delay   Extremities:  trace left pedal edema and trace right pedal edema.  no arophy  Neurologic:  alert & oriented X3, strength normal in all extremities, and gait normal.   Skin:  turgor normal, color normal, no petechiae, and no ulcerations.   Cervical Nodes:  No lymphadenopathy noted Psych:  Oriented X3, good eye contact, not anxious appearing, and not depressed appearing.     Impression & Recommendations:  Problem # 1:  ALLERGIC RHINITIS (ICD-477.9) stabae on currrent regimen wiht se of other steroid ns  will re rx . Dr Christella Hartigan no longer in the area practicing Her updated medication list for this problem includes:    Rhinocort  Aqua 32 Mcg/act Susp (Budesonide (nasal)) .Marland Kitchen... 2 sprays in each nostril daily    Claritin 10 Mg Tabs (Loratadine) .Marland Kitchen... As needed  Problem # 2:  ? of LIVER HEMANGIOMA (ICD-228.04) Unclear lesion  needs follow up no signs . Orders: Radiology Referral (Radiology)  Problem # 3:  UNSPECIFIED ANEMIA (ICD-285.9) Assessment: Improved  The following medications were removed from the medication list:    Folic Acid 400 Mcg Tabs (Folic acid)  Problem # 4:  OSTEOARTHRITIS (ICD-715.90)  The following medications were removed from the medication list:    Lortab 7.5 7.5-500 Mg Tabs (Hydrocodone-acetaminophen) .Marland Kitchen... 1 by mouth qid as needed Her updated medication list for this problem includes:    Aspirin 81 Mg Tbec (Aspirin) ..... Once daily    Celebrex 200 Mg Caps (Celecoxib) .Marland Kitchen... 1 by mouth two times a day    Tramadol Hcl 50 Mg Tabs (Tramadol hcl) .Marland Kitchen... 1 by mouth q 4-6 hours a  day as needed  for pain    Tylenol Extra Strength 500 Mg Tabs (Acetaminophen)  Problem # 5:  GERD (ICD-530.81)  The following medications were removed from the medication list:    Nexium 40 Mg Cpdr  (Esomeprazole magnesium) Her updated medication list for this problem includes:    Zegerid 40-1100 Mg Caps (Omeprazole-sodium bicarbonate)    Gaviscon Acid Brkthrgh Formula 500 Mg Chew (Calcium carbonate antacid) .Marland Kitchen... As needed  Problem # 6:  SLEEP APNEA, OBSTRUCTIVE (ICD-327.23) under rx   cpap  Problem # 7:  mood  stable   Problem # 8:  HYPERLIPIDEMIA (ICD-272.4)  Her updated medication list for this problem includes:    Caduet 5-20 Mg Tabs (Amlodipine-atorvastatin) .Marland Kitchen... Take 1 tablet by mouth once a day  Complete Medication List: 1)  Cymbalta 60 Mg Cpep (Duloxetine hcl) .Marland Kitchen.. 1 by mouth once daily 2)  Valtrex 1 Gm Tabs (Valacyclovir hcl) 3)  Altace 10 Mg Tabs (Ramipril) .... Take 1 tablet by mouth once a day 4)  Caduet 5-20 Mg Tabs (Amlodipine-atorvastatin) .... Take 1 tablet by mouth once a day 5)  Aspirin 81 Mg Tbec (Aspirin) .... Once daily 6)  Zegerid 40-1100 Mg Caps (Omeprazole-sodium bicarbonate) 7)  Gaviscon Acid Brkthrgh Formula 500 Mg Chew (Calcium carbonate antacid) .... As needed 8)  Vivelle-dot 0.0375 Mg/24hr Pttw (Estradiol) .... Change every 4 days 9)  Multivitamins Tabs (Multiple vitamin) 10)  Caltrate 600+d 600-400 Mg-unit Tabs (Calcium carbonate-vitamin d) 11)  Rhinocort Aqua 32 Mcg/act Susp (Budesonide (nasal)) .... 2 sprays in each nostril daily 12)  Claritin 10 Mg Tabs (Loratadine) .... As needed 13)  Metrogel 1 % Gel (Metronidazole) 14)  Finacea 15 % Gel (Azelaic acid) .... Two times a day 15)  Nitroquick 0.4 Mg Subl (Nitroglycerin) .... As needed 16)  Celebrex 200 Mg Caps (Celecoxib) .Marland Kitchen.. 1 by mouth two times a day 17)  Tramadol Hcl 50 Mg Tabs (Tramadol hcl) .Marland Kitchen.. 1 by mouth q 4-6 hours a  day as needed  for pain 18)  Tylenol Extra Strength 500 Mg Tabs (Acetaminophen) 19)  Ativan 1 Mg Tabs (Lorazepam) .... Take 1 tablet pre procedure 20)  Coreg 3.125 Mg Tabs (Carvedilol) .Marland Kitchen.. 1 by mouth two times a day 21)  Vivelle-dot 0.05 Mg/24hr Pttw (Estradiol) ....  Alt with .0375mg   Other Orders: Admin 1st Vaccine ([address]) Flu Vaccine 55yrs + ([address])  Hypertension Assessment/Plan:      The patient's hypertensive risk group is category B: At least one risk factor (excluding diabetes) with no target organ damage.  Her calculated 10 year risk of coronary heart disease is 3 %.  Today's blood pressure is 110/62.  Her blood pressure goal is < 140/90.  Patient Instructions: 1)  Will set up   repeat MRI for you liver lesion.  November with labs  before  2)  BMP and lfts and lipids , TSH,  CBCdiff   iron panel , and ferritin.  3)  then plan follow up  Prescriptions: RHINOCORT AQUA 32 MCG/ACT  SUSP (BUDESONIDE (NASAL)) 2 sprays in each nostril daily  #1 x 12   Entered and Authorized by:   Madelin Headings MD   Signed by:   Madelin Headings MD on 09/09/2009   Method used:   Electronically to        [id number] (retail)       [address] [place], [place]  [address]       Ph: [phone]       Fax: [phone]   RxID:   [id number]          Flu Vaccine Consent Questions     Do you have a history of severe allergic reactions to this vaccine? no    Any prior history of allergic reactions to egg and/or gelatin? no    Do you have a sensitivity to the preservative Thimersol? no    Do you have a past history of Guillan-Barre Syndrome? no    Do you currently have an acute febrile illness? no    Have you ever had a severe reaction to latex? no    Vaccine information given and explained to patient? yes    Are you currently pregnant? no    Lot Number:[id number]   Exp Date:06/03/2010   Site Given  Left Deltoid IM Romualdo Bolk, CMA (AAMA)  September 09, 2009 11:10 AM

## 2011-01-04 NOTE — Letter (Signed)
Summary: sleep center  sleep center   Imported By: Kassie Mends 09/10/2007 10:50:17  _____________________________________________________________________  External Attachment:    Type:   Image     Comment:   sleep center

## 2011-01-04 NOTE — Medication Information (Signed)
Summary: [facility] Drug Request-Rhinocort Aqua  [facility] Drug Request-Rhinocort Aqua   Imported By: Maryln Gottron 02/11/2010 13:33:14  _____________________________________________________________________  External Attachment:    Type:   Image     Comment:   External Document

## 2011-01-04 NOTE — Progress Notes (Signed)
Summary: HIP PAIN   Phone Note Call from Patient Call back at Home Phone [phone]   Caller: Patient Call For: [facility] Summary of Call: SHE HAS BAD PAIN IN HER HIP DR [facility] HAS NOT OPENINGS IN HER SCHEDULE SAID TO SEND THESE TO TRIAGE  Initial call taken by: Roselle Locus,  September 21, 2007 12:01 PM  Follow-up for Phone Call        cell [phone]  Scheduled OV for Monday 10/20 @ 1:30 pm.  Pt has been using Celebrex which has been helping some.  Recommended trying ice and/or moist heat 15 min two times a day. Follow-up by: Sid Falcon LPN,  September 21, 2007 3:20 PM

## 2011-01-04 NOTE — Assessment & Plan Note (Signed)
Summary: 6 MONTHS ROV/SSC   Vital Signs:  Patient profile:   65 year old female Menstrual status:  hysterectomy Height:      67 inches Weight:      215 pounds BMI:     33.80 Temp:     89.4 degrees F oral BP sitting:   102 / 70  (right arm)  Vitals Entered By: Kathrynn Speed CMA (Apr 26, 2010 10:23 AM) CC: ROV / lab   History of Present Illness: Terri Rodriguez comesin comes in today  for follow up  of multiple medical problems  Losing weight    with weight  watchers . KNees  meniscal tear. Stable   Back   much better now . Meds :  vasodilator helps  vasospasm. Doesnt have HT ' no more cp . Joints : Once a day  celebrex   tramadol only ocassional Sleep apnesa still uncre rx  Liver  spot on MRI prob hemangioma  following GERD   stable to less   Preventive Screening-Counseling & Management  Alcohol-Tobacco     Alcohol drinks/day: 2     Alcohol type: wine     Smoking Status: never  Caffeine-Diet-Exercise     Caffeine use/day: 2     Does Patient Exercise: yes  Current Medications (verified): 1)  Cymbalta 60 Mg Cpep (Duloxetine Hcl) .Marland Kitchen.. 1 By Mouth Once Daily 2)  Valtrex 1 Gm Tabs (Valacyclovir Hcl) .... As Needed 3)  Altace 10 Mg  Tabs (Ramipril) .... Take 1 Tablet By Mouth Once A Day 4)  Aspirin 81 Mg  Tbec (Aspirin) .... Once Daily 5)  Zegerid Otc 20-1100 Mg Caps (Omeprazole-Sodium Bicarbonate) .... 2 By Mouth Once Daily 6)  Gaviscon Acid Brkthrgh Formula 500 Mg  Chew (Calcium Carbonate Antacid) .... As Needed 7)  Vivelle-Dot 0.0375 Mg/24hr  Pttw (Estradiol) .... Change Every 4 Days 8)  Multivitamins   Tabs (Multiple Vitamin) 9)  Caltrate 600+d 600-400 Mg-Unit  Tabs (Calcium Carbonate-Vitamin D) .... Two Times A Day 10)  Rhinocort Aqua 32 Mcg/act  Susp (Budesonide (Nasal)) .... As Needed 11)  Claritin 10 Mg  Tabs (Loratadine) .... As Needed 12)  Metrogel 1 % Gel (Metronidazole) .... To Face Once Daily 13)  Finacea 15 %  Gel (Azelaic Acid) .... To Face 14)  Nitroquick 0.4  Mg  Subl (Nitroglycerin) .... As Needed 15)  Celebrex 200 Mg  Caps (Celecoxib) .... Once Daily 16)  Tramadol Hcl 50 Mg  Tabs (Tramadol Hcl) .Marland Kitchen.. 1 By Mouth Q 4-6 Hours A  Day As Needed  For Pain 17)  Tylenol Extra Strength 500 Mg  Tabs (Acetaminophen) .... As Needed 18)  Ativan 1 Mg  Tabs (Lorazepam) .... Take 1 Tablet Pre Procedure 19)  Coreg 3.125 Mg  Tabs (Carvedilol) .Marland Kitchen.. 1 By Mouth Two Times A Day 20)  Vivelle-Dot 0.05 Mg/24hr  Pttw (Estradiol) .... Alt With .0375mg  21)  Iron 325 (65 Fe) Mg Tabs (Ferrous Sulfate) 22)  Lipitor 20 Mg Tabs (Atorvastatin Calcium) .... Take 1 By Mouth Once Daily 23)  Norvasc 5 Mg Tabs (Amlodipine Besylate) .... Take 1 By Mouth Once Daily 24)  Vitamin D3 2000 Unit Caps (Cholecalciferol) .... Take 1 By Mouth Once Daily  Allergies (verified): No Known Drug Allergies  Past History:  Past medical, surgical, family and social histories (including risk factors) reviewed, and no changes noted (except as noted below).  Past Medical History: Reviewed history from 03/23/2010 and no changes required. Allergic rhinitis GERD Hyperlipidemia Hypertension  may not have  ht is on meds for raynauds and CA spasm   hx of preeclampsia  Osteoarthritis, mild degenerative joint disease Raynauds  Cholelithiaisis Abnormal  MRI liver lesion probably hemangioma Obstructive Sleep Apnea- 12/28/06- NPSG AHI 87/hr  had Cardiac cath in Dec for abn ekg? and normal arteries Dr Tresa Endo   CONSULTANTS  Lorelee Cover  Past Surgical History: Reviewed history from 05/01/2008 and no changes required. G4P1  preeclampsia  1982 C-section, D & C  1985, 1989 nasal septoplasty poly   nonadenomatous Tonsillectomy  1952 Hysterectomy  1993 Oophorectomy, salpingectomy bilat  1993 lumbar surgery fixation    with disc replacement   Dr Newell Coral   Family History: Reviewed history from 03/23/2010 and no changes required. cv disease, cancer , dm , breast cancer.   Father- DVT/ PE  OSA-whole  family  Social History: Reviewed history from 03/23/2010 and no changes required. 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    Physical Exam  General:  alert, well-developed, well-nourished, and well-hydrated.   Eyes:  vision grossly intact, pupils equal, and pupils round.   Nose:  no external deformity and no nasal discharge.   Neck:  No deformities, masses, or tenderness noted. Lungs:  Normal respiratory effort, chest expands symmetrically. Lungs are clear to auscultation, no crackles or wheezes. Heart:  Normal rate and regular rhythm. S1 and S2 normal without gallop, murmur, click, rub or other extra sounds.no lifts.   Abdomen:  Bowel sounds positive,abdomen soft and non-tender without masses, organomegaly or  noted. Msk:  no joint warmth and no redness over joints.   Pulses:  pulses intact without delay    nl cap refill  Neurologic:  alert & oriented X3, strength normal in all extremities, and gait normal.   nl cognition Skin:  turgor normal, color normal, no ecchymoses, and no petechiae.   Cervical Nodes:  No lymphadenopathy noted Psych:  Oriented X3, good eye contact, not anxious appearing, and not depressed appearing.     Impression & Recommendations:  Problem # 1:  UNSPECIFIED ANEMIA (ICD-285.9)  iron related  Her updated medication list for this problem includes:    Iron 325 (65 Fe) Mg Tabs (Ferrous sulfate)  Hgb: 13.1 (04/15/2010)   Hct: 37.0 (04/15/2010)   Platelets: 241.0 (04/15/2010) RBC: 3.84 (04/15/2010)   RDW: 14.0 (04/15/2010)   WBC: 6.3 (04/15/2010) MCV: 96.6 (04/15/2010)   MCHC: 35.4  (04/15/2010) Ferritin: 31.9 (10/15/2009) Iron: 76 (10/15/2009)   % Sat: 21.0 (10/15/2009) B12: 1228 (11/26/2008)   Folate: 19.2 (11/08/2006)   TSH: 1.02 (10/15/2009)  Problem # 2:  RAYNAUD'S SYNDROME, HX OF (ICD-V12.59) Assessment: Comment Only  Problem # 3:  OSTEOARTHRITIS (ICD-715.90)  Her updated medication list for this problem includes:    Aspirin 81 Mg Tbec (Aspirin) ..... Once daily    Celebrex 200 Mg Caps (Celecoxib) ..... Once daily    Tramadol Hcl 50 Mg Tabs (Tramadol hcl) .Marland Kitchen... 1 by mouth q 4-6 hours a  day as needed  for pain    Tylenol Extra Strength 500 Mg Tabs (Acetaminophen) .Marland Kitchen... As needed  Problem # 4:  LEUKOPENIA, CHRONIC (ICD-288.50)  Problem # 5:  OTHER SPECIFIED DISORDERS OF LIVER ABN MRI (ICD-573.8) stable   Problem # 6:  SLEEP APNEA, OBSTRUCTIVE (ICD-327.23) Assessment: Comment Only  Problem # 7:  elevated BMI improved  lifestyle intervention   Problem # 8:  ROSACEA (ICD-695.3) Assessment: Comment Only  Complete Medication List: 1)  Cymbalta 60 Mg Cpep (Duloxetine hcl) .Marland Kitchen.. 1 by mouth once daily 2)  Valtrex 1 Gm Tabs (Valacyclovir hcl) .... As needed 3)  Altace 10 Mg Tabs (Ramipril) .... Take 1 tablet by mouth once a day 4)  Aspirin 81 Mg Tbec (Aspirin) .... Once daily 5)  Zegerid Otc 20-1100 Mg Caps (Omeprazole-sodium bicarbonate) .... 2 by mouth once daily 6)  Gaviscon Acid Brkthrgh Formula 500 Mg Chew (Calcium carbonate antacid) .... As needed 7)  Vivelle-dot 0.0375 Mg/24hr Pttw (Estradiol) .... Change every 4 days 8)  Multivitamins Tabs (Multiple vitamin) 9)  Caltrate 600+d 600-400 Mg-unit Tabs (Calcium carbonate-vitamin d) .... Two times a day 10)  Rhinocort Aqua 32 Mcg/act Susp (Budesonide (nasal)) .... As needed 11)  Claritin 10 Mg Tabs (Loratadine) .... As needed 12)  Metrogel 1 % Gel (Metronidazole) .... To face once daily 13)  Finacea 15 % Gel (Azelaic acid) .... To face 14)  Nitroquick 0.4 Mg Subl (Nitroglycerin) .... As needed 15)   Celebrex 200 Mg Caps (Celecoxib) .... Once daily 16)  Tramadol Hcl 50 Mg Tabs (Tramadol hcl) .Marland Kitchen.. 1 by mouth q 4-6 hours a  day as needed  for pain 17)  Tylenol Extra Strength 500 Mg Tabs (Acetaminophen) .... As needed 18)  Ativan 1 Mg Tabs (Lorazepam) .... Take 1 tablet pre procedure 19)  Coreg 3.125 Mg Tabs (Carvedilol) .Marland Kitchen.. 1 by mouth two times a day 20)  Vivelle-dot 0.05 Mg/24hr Pttw (Estradiol) .... Alt with .0375mg  21)  Iron 325 (65 Fe) Mg Tabs (Ferrous sulfate) 22)  Lipitor 20 Mg Tabs (Atorvastatin calcium) .... Take 1 by mouth once daily 23)  Norvasc 5 Mg Tabs (Amlodipine besylate) .... Take 1 by mouth once daily 24)  Vitamin D3 2000 Unit Caps (Cholecalciferol) .... Take 1 by mouth once daily  Other Orders: Pneumococcal Vaccine ([address]) Admin 1st Vaccine ([address])  Patient Instructions: 1)  continue healthy weight loss. 2)  due for full set of labs in the fall( 6 months )  ok to take the iron ocass every other day .    3)  Schedule for CPX in 6 months   with labs   . Prescriptions: VALTREX 1 GM TABS (VALACYCLOVIR HCL) as needed  #30 x 6   Entered and Authorized by:   Madelin Headings MD   Signed by:   Madelin Headings MD on 04/26/2010   Method used:   Electronically to        [id number] (retail)       [address] [place], [place]  [address]       Ph: [phone]       Fax: [phone]   RxID:   [id number]    Immunizations Administered:  Pneumonia Vaccine:    Vaccine Type: Pneumovax    Site: left deltoid    Mfr: Merck    Dose: 0.5 ml    Route: IM    Given by: Romualdo Bolk, CMA (AAMA)    Exp. Date: 07/10/2011    Lot #: [id number]

## 2011-01-04 NOTE — Medication Information (Signed)
Summary: Request for Prior Authorization-Rhinocort Aqua  Request for Prior Authorization-Rhinocort Aqua   Imported By: Maryln Gottron 02/08/2010 14:26:10  _____________________________________________________________________  External Attachment:    Type:   Image     Comment:   External Document

## 2011-01-04 NOTE — Consult Note (Signed)
Summary: Dr Newell Coral note  Dr Newell Coral note   Imported By: Kassie Mends 03/11/2008 08:42:55  _____________________________________________________________________  External Attachment:    Type:   Image     Comment:   Dr Newell Coral note

## 2011-01-04 NOTE — Letter (Signed)
Summary: [facility]  [facility]   Imported By: Maryln Gottron 04/21/2010 14:43:56  _____________________________________________________________________  External Attachment:    Type:   Image     Comment:   External Document

## 2011-01-04 NOTE — Letter (Signed)
Summary: [facility]  [facility]   Imported By: Maryln Gottron 01/21/2009 13:17:42  _____________________________________________________________________  External Attachment:    Type:   Image     Comment:   External Document

## 2011-01-04 NOTE — Assessment & Plan Note (Signed)
Summary: follow up/cjr   Vital Signs:  Patient profile:   65 year old female Menstrual status:  hysterectomy Weight:      233 pounds Pulse rate:   72 / minute BP sitting:   100 / 70  (left arm) Cuff size:   large  Vitals Entered By: Romualdo Bolk, CMA (AAMA) (October 26, 2009 2:22 PM) CC: Follow-up visit on labs, Hypertension Management   History of Present Illness: Terri Rodriguez comes  in for follow up of abnormal MRI and med management . She is not on meds for HT but  " could have had it"  but on meds for her raynauds and other medical conditions.  Since last visit  here  there have been no major changes in health status  .  Abnormal liver MRI: stable and sees to be from a hemangioma   no signs .  Mood ; stable and doing well. OSA under treatment  Hyperlipidemia and  raynauds.    Hyperglycemia  and  weight  : doing weight watcher to help .    Hypertension History:      She complains of headache, but denies chest pain, palpitations, dyspnea with exertion, orthopnea, PND, peripheral edema, visual symptoms, neurologic problems, syncope, and side effects from treatment.  She notes no problems with any antihypertensive medication side effects.  Ha's due to stress.        Positive major cardiovascular risk factors include female age 64 years old or older, hyperlipidemia, and hypertension.  Negative major cardiovascular risk factors include non-tobacco-user status.     Preventive Screening-Counseling & Management  Alcohol-Tobacco     Alcohol drinks/day: 2     Alcohol type: wine     Smoking Status: never  Caffeine-Diet-Exercise     Caffeine use/day: 2     Does Patient Exercise: yes  Current Medications (verified): 1)  Cymbalta 60 Mg Cpep (Duloxetine Hcl) .Marland Kitchen.. 1 By Mouth Once Daily 2)  Valtrex 1 Gm Tabs (Valacyclovir Hcl) .... 2 Two Times A Day 3)  Altace 10 Mg  Tabs (Ramipril) .... Take 1 Tablet By Mouth Once A Day 4)  Caduet 5-20 Mg  Tabs (Amlodipine-Atorvastatin) ....  Take 1 Tablet By Mouth Once A Day 5)  Aspirin 81 Mg  Tbec (Aspirin) .... Once Daily 6)  Zegerid 40-1100 Mg Caps (Omeprazole-Sodium Bicarbonate) 7)  Gaviscon Acid Brkthrgh Formula 500 Mg  Chew (Calcium Carbonate Antacid) .... As Needed 8)  Vivelle-Dot 0.0375 Mg/24hr  Pttw (Estradiol) .... Change Every 4 Days 9)  Multivitamins   Tabs (Multiple Vitamin) 10)  Caltrate 600+d 600-400 Mg-Unit  Tabs (Calcium Carbonate-Vitamin D) 11)  Rhinocort Aqua 32 Mcg/act  Susp (Budesonide (Nasal)) .... 2 Sprays in Each Nostril Daily 12)  Claritin 10 Mg  Tabs (Loratadine) .... As Needed 13)  Metrogel 1 % Gel (Metronidazole) 14)  Finacea 15 %  Gel (Azelaic Acid) .... Two Times A Day 15)  Nitroquick 0.4 Mg  Subl (Nitroglycerin) .... As Needed 16)  Celebrex 200 Mg  Caps (Celecoxib) .Marland Kitchen.. 1 By Mouth Two Times A Day 17)  Tramadol Hcl 50 Mg  Tabs (Tramadol Hcl) .Marland Kitchen.. 1 By Mouth Q 4-6 Hours A  Day As Needed  For Pain 18)  Tylenol Extra Strength 500 Mg  Tabs (Acetaminophen) 19)  Ativan 1 Mg  Tabs (Lorazepam) .... Take 1 Tablet Pre Procedure 20)  Coreg 3.125 Mg  Tabs (Carvedilol) .Marland Kitchen.. 1 By Mouth Two Times A Day 21)  Vivelle-Dot 0.05 Mg/24hr  Pttw (Estradiol) .... Alt  With .0375mg  22)  Iron 325 (65 Fe) Mg Tabs (Ferrous Sulfate)  Allergies (verified): No Known Drug Allergies  Past History:  Past medical, surgical, family and social histories (including risk factors) reviewed, and no changes noted (except as noted below).  Past Medical History: Allergic rhinitis GERD Hyperlipidemia Hypertension  may not have ht is on meds for raynauds and CA spasm   hx of preeclampsia  Osteoarthritis, mild degenerative joint disease Raynauds  Cholelithiaisis Abnormal  MRI liver lesion probably hemangioma  had Cardiac cath in Dec for abn ekg? and normal arteries Dr Tresa Endo   CONSULTANTS  Lorelee Cover  Past Surgical History: Reviewed history from 05/01/2008 and no changes required. G4P1  preeclampsia  1982 C-section, D & C   1985, 1989 nasal septoplasty poly   nonadenomatous Tonsillectomy  1952 Hysterectomy  1993 Oophorectomy, salpingectomy bilat  1993 lumbar surgery fixation    with disc replacement   Dr Newell Coral   Family History: Reviewed history from 12/15/2008 and no changes required. cv disease, cancer , dm , breast cancer.    Social History: Reviewed history from 09/09/2009 and no changes required. Occupation:physician Married  Never Smoked Alcohol use-yes social hh of 2  no  pets  Review of Systems  The patient denies anorexia, fever, vision loss, decreased hearing, chest pain, syncope, dyspnea on exertion, peripheral edema, prolonged cough, abdominal pain, melena, transient blindness, difficulty walking, unusual weight change, abnormal bleeding, enlarged lymph nodes, and angioedema.    Physical Exam  General:  Well-developed,well-nourished,in no acute distress; alert,appropriate and cooperative throughout examination Head:  normocephalic and atraumatic.   Neck:  No deformities, masses, or tenderness noted. Lungs:  Normal respiratory effort, chest expands symmetrically. Lungs are clear to auscultation, no crackles or wheezes. Heart:  Normal rate and regular rhythm. S1 and S2 normal without gallop, murmur, click, rub or other extra sounds.no lifts.   Abdomen:  Bowel sounds positive,abdomen soft and non-tender without masses, organomegaly or  noted. Msk:  no joint swelling and no joint warmth.   no atrophy  Pulses:  pulses intact without delay    Extremities:  trace left pedal edema and trace right pedal edema.  no arophy  Neurologic:  alert & oriented X3, strength normal in all extremities, and gait normal.   Skin:  turgor normal, color normal, no ecchymoses, no petechiae, and no purpura.   Cervical Nodes:  No lymphadenopathy noted Psych:  Oriented X3, good eye contact, not anxious appearing, and not depressed appearing.     Impression & Recommendations:  Problem # 1:  OTHER SPECIFIED  DISORDERS OF LIVER ABN MRI (ICD-573.8) Assessment Unchanged prob  hemangioma  wil not image further for now.   Problem # 2:  LEUKOPENIA, CHRONIC (ICD-288.50) 2.9 to follow   Problem # 3:  SLEEP APNEA, OBSTRUCTIVE (ICD-327.23) Assessment: Unchanged  Problem # 4:  RAYNAUD'S SYNDROME, HX OF (ICD-V12.59) Assessment: Unchanged  Problem # 5:  OSTEOARTHRITIS (ICD-715.90)  Her updated medication list for this problem includes:    Aspirin 81 Mg Tbec (Aspirin) ..... Once daily    Celebrex 200 Mg Caps (Celecoxib) .Marland Kitchen... 1 by mouth two times a day    Tramadol Hcl 50 Mg Tabs (Tramadol hcl) .Marland Kitchen... 1 by mouth q 4-6 hours a  day as needed  for pain    Tylenol Extra Strength 500 Mg Tabs (Acetaminophen)  Problem # 6:  HYPERLIPIDEMIA (ICD-272.4)  Her updated medication list for this problem includes:    Caduet 5-20 Mg Tabs (Amlodipine-atorvastatin) .Marland Kitchen... Take 1 tablet  by mouth once a day  Problem # 7:  elevated  BMI   agree with    weight watchers  Problem # 8:  UNSPECIFIED ANEMIA (ICD-285.9) Assessment: Improved  Her updated medication list for this problem includes:    Iron 325 (65 Fe) Mg Tabs (Ferrous sulfate)  Hgb: 13.2 (10/15/2009)   Hct: 37.9 (10/15/2009)   Platelets: 199.0 (10/15/2009) RBC: 3.82 (10/15/2009)   RDW: 13.0 (10/15/2009)   WBC: 2.9 (10/15/2009) MCV: 99.3 (10/15/2009)   MCHC: 34.8 (10/15/2009) Ferritin: 31.9 (10/15/2009) Iron: 76 (10/15/2009)   % Sat: 21.0 (10/15/2009) B12: 1228 (11/26/2008)   Folate: 19.2 (11/08/2006)   TSH: 1.02 (10/15/2009)  Complete Medication List: 1)  Cymbalta 60 Mg Cpep (Duloxetine hcl) .Marland Kitchen.. 1 by mouth once daily 2)  Valtrex 1 Gm Tabs (Valacyclovir hcl) .... 2 two times a day 3)  Altace 10 Mg Tabs (Ramipril) .... Take 1 tablet by mouth once a day 4)  Caduet 5-20 Mg Tabs (Amlodipine-atorvastatin) .... Take 1 tablet by mouth once a day 5)  Aspirin 81 Mg Tbec (Aspirin) .... Once daily 6)  Zegerid 40-1100 Mg Caps (Omeprazole-sodium bicarbonate) 7)   Gaviscon Acid Brkthrgh Formula 500 Mg Chew (Calcium carbonate antacid) .... As needed 8)  Vivelle-dot 0.0375 Mg/24hr Pttw (Estradiol) .... Change every 4 days 9)  Multivitamins Tabs (Multiple vitamin) 10)  Caltrate 600+d 600-400 Mg-unit Tabs (Calcium carbonate-vitamin d) 11)  Rhinocort Aqua 32 Mcg/act Susp (Budesonide (nasal)) .... 2 sprays in each nostril daily 12)  Claritin 10 Mg Tabs (Loratadine) .... As needed 13)  Metrogel 1 % Gel (Metronidazole) 14)  Finacea 15 % Gel (Azelaic acid) .... Two times a day 15)  Nitroquick 0.4 Mg Subl (Nitroglycerin) .... As needed 16)  Celebrex 200 Mg Caps (Celecoxib) .Marland Kitchen.. 1 by mouth two times a day 17)  Tramadol Hcl 50 Mg Tabs (Tramadol hcl) .Marland Kitchen.. 1 by mouth q 4-6 hours a  day as needed  for pain 18)  Tylenol Extra Strength 500 Mg Tabs (Acetaminophen) 19)  Ativan 1 Mg Tabs (Lorazepam) .... Take 1 tablet pre procedure 20)  Coreg 3.125 Mg Tabs (Carvedilol) .Marland Kitchen.. 1 by mouth two times a day 21)  Vivelle-dot 0.05 Mg/24hr Pttw (Estradiol) .... Alt with .0375mg  22)  Iron 325 (65 Fe) Mg Tabs (Ferrous sulfate)  Hypertension Assessment/Plan:      The patient's hypertensive risk group is category B: At least one risk factor (excluding diabetes) with no target organ damage.  Her calculated 10 year risk of coronary heart disease is 3 %.  Today's blood pressure is 100/70.  Her blood pressure goal is < 140/90.  Patient Instructions: 1)  continue iron for another 3 months . 2)  Continue weight watchers. 3)  Repeat CBCdiff.    288  and do hg a1c at that time   dx hyperglycemia

## 2011-01-04 NOTE — Letter (Signed)
Summary: [facility]  [facility]   Imported By: Maryln Gottron 09/15/2009 13:27:32  _____________________________________________________________________  External Attachment:    Type:   Image     Comment:   External Document

## 2011-01-04 NOTE — Progress Notes (Signed)
Summary: refill   Phone Note From Pharmacy   [facility]  [address] [phone]* Reason for Call: Needs renewal Details for Reason: Hydrocodone Initial call taken by: Romualdo Bolk, CMA,  December 20, 2007 1:58 PM  Follow-up for Phone Call        Per md- ok #30 with 1 additional refill. Sent back via fax. Follow-up by: Romualdo Bolk, CMA,  December 20, 2007 1:58 PM      Prescriptions: LORTAB 7.5 7.5-500 MG  TABS (HYDROCODONE-ACETAMINOPHEN) 1 by mouth qid as needed  #30 x 1   Entered by:   Romualdo Bolk, CMA   Authorized by:   Madelin Headings MD   Signed by:   Romualdo Bolk, CMA on 12/20/2007   Method used:   Telephoned to ...       [facility] [phone]*       [address]       [place], [place]  [address]       Ph: [phone] or [phone]       Fax: [phone]   RxID:   [id number]

## 2011-01-04 NOTE — Assessment & Plan Note (Signed)
Summary: cpx/njr   Vital Signs:  Patient profile:   65 year old female Menstrual status:  hysterectomy Height:      66.5 inches Weight:      216 pounds BMI:     34.47 Pulse rate:   78 / minute BP sitting:   110 / 60  (left arm) Cuff size:   large  Vitals Entered By: Romualdo Bolk, CMA (AAMA) (October 12, 2010 10:11 AM) CC: CPX   History of Present Illness: Terri Rodriguez comes in today   for preventive visit . She has multiple medical conditions that require meds but is doing well.  Since last visit  here  there have been no major changes in health status .  Back:  Pain down leg   left paresthesia and saw Dr Gae Dry and considering MRI  and surgery.  Rx celebrex   two times a day for a short time  and getting better.  Sept  had Knee meniscal tear    and knee surgery . Tinnitus  :some worse.    both left worse.  Vision : no change has specialist.   incipient cataracts.  No cv signs and no raynauds  flare recently .   Anemia :   iron  defiency      on i iron  every other day . OSA : no change Mood and  pain stable. HRT: want to remain onthe patch . no se .   helps  maintain her singing voice amontg other  things.  ON low dose   patch.  GERD: no change On altace and coreg   denies HT  but non obstructive   cd .  vasospastic  and followed by Dr Tresa Endo.   Preventive Care Screening  Mammogram:    Date:  01/19/2010    Results:  normal   Last Tetanus Booster:    Date:  12/05/2001    Results:  Historical    Preventive Screening-Counseling & Management  Alcohol-Tobacco     Alcohol drinks/day: 2     Alcohol type: wine     Smoking Status: never  Caffeine-Diet-Exercise     Caffeine use/day: 2     Does Patient Exercise: yes  Hep-HIV-STD-Contraception     Dental Visit-last 6 months yes     Sun Exposure-Excessive: no  Safety-Violence-Falls     Seat Belt Use: yes     Smoke Detectors: yes  Current Medications (verified): 1)  Cymbalta 60 Mg Cpep (Duloxetine Hcl) .Marland Kitchen..  1 By Mouth Once Daily 2)  Valtrex 1 Gm Tabs (Valacyclovir Hcl) .... As Needed 3)  Altace 10 Mg  Tabs (Ramipril) .... Take 1 Tablet By Mouth Once A Day 4)  Aspirin 81 Mg  Tbec (Aspirin) .... Once Daily 5)  Zegerid Otc 20-1100 Mg Caps (Omeprazole-Sodium Bicarbonate) .... 2 By Mouth Once Daily 6)  Gaviscon Acid Brkthrgh Formula 500 Mg  Chew (Calcium Carbonate Antacid) .... As Needed 7)  Vivelle-Dot 0.0375 Mg/24hr  Pttw (Estradiol) .... Change Every 4 Days 8)  Multivitamins   Tabs (Multiple Vitamin) 9)  Caltrate 600+d 600-400 Mg-Unit  Tabs (Calcium Carbonate-Vitamin D) .... One Time A Day 10)  Rhinocort Aqua 32 Mcg/act  Susp (Budesonide (Nasal)) .... As Needed 11)  Claritin 10 Mg  Tabs (Loratadine) .... As Needed 12)  Metrogel 1 % Gel (Metronidazole) .... To Face Once Daily 13)  Finacea 15 %  Gel (Azelaic Acid) .... To Face 14)  Nitroquick 0.4 Mg  Subl (Nitroglycerin) .... As Needed  15)  Celebrex 200 Mg  Caps (Celecoxib) .... Once Daily 16)  Tramadol Hcl 50 Mg  Tabs (Tramadol Hcl) .Marland Kitchen.. 1 By Mouth Q 4-6 Hours A  Day As Needed  For Pain 17)  Tylenol Extra Strength 500 Mg  Tabs (Acetaminophen) .... As Needed 18)  Ativan 1 Mg  Tabs (Lorazepam) .... Take 1 Tablet Pre Procedure 19)  Coreg 3.125 Mg  Tabs (Carvedilol) .Marland Kitchen.. 1 By Mouth Two Times A Day 20)  Vivelle-Dot 0.05 Mg/24hr  Pttw (Estradiol) .... Alt With .0375mg  21)  Iron 325 (65 Fe) Mg Tabs (Ferrous Sulfate) 22)  Lipitor 20 Mg Tabs (Atorvastatin Calcium) .... Take 1 By Mouth Once Daily 23)  Norvasc 5 Mg Tabs (Amlodipine Besylate) .... Take 1 By Mouth Once Daily 24)  Vitamin D3 2000 Unit Caps (Cholecalciferol) .... Take 1 By Mouth Once Daily 25)  Cpap 11 Advanced  Allergies (verified): No Known Drug Allergies  Past History:  Past medical, surgical, family and social histories (including risk factors) reviewed, and no changes noted (except as noted below).  Past Medical History: Allergic rhinitis GERD Hyperlipidemia raynauds and CA  spasm   hx of preeclampsia  Osteoarthritis, mild degenerative joint disease back and hands and knees  Raynauds  Cholelithiaisis Abnormal  MRI liver lesion probably hemangioma Obstructive Sleep Apnea- 12/28/06- NPSG AHI 87/hr had Cardiac cath in Dec for abn ekg? and normal arteries Dr Tresa Endo   [employer]SULTANTS  Lorelee Cover  Past Surgical History: G4P1  preeclampsia  1982 C-section, D & C  1985, 1989 nasal septoplasty poly   nonadenomatous Tonsillectomy  1952 Hysterectomy  1993 Oophorectomy, salpingectomy bilat  1993 lumbar surgery fixation    with disc replacement   Dr Newell Coral  10/08 left  Past History:  Care Management: Gynecology: Hyacinth Meeker Dermatology: Emily Filbert Cardiology: Tresa Endo Gastroenterology: Kinnie Scales Orthopedics: [facility] Neurosurgery: Jule Ser  Family History: Reviewed history from 03/23/2010 and no changes required. cv disease, cancer , dm , breast cancer.   Father- DVT/ PE  OSA-whole family  Social History: Reviewed history from 03/23/2010 and no changes required. 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         no serious skin disease.  roascea.     Physical Exam  General:  Well-developed,well-nourished,in no acute distress; alert,appropriate and cooperative throughout examination Head:  normocephalic and atraumatic.   Eyes:  vision grossly intact, pupils equal, and pupils round.    Ears:  R ear normal and L ear normal.  no external deformities.   Nose:  no external deformity and no nasal discharge.   Mouth:  pharynx pink and moist.   Neck:  No deformities, masses, or tenderness noted. Breasts:  No mass, nodules, thickening, tenderness, bulging, retraction, inflamation, nipple discharge or skin changes noted.   Lungs:  Normal respiratory effort, chest expands symmetrically. Lungs are clear to auscultation, no crackles or wheezes. Heart:  Normal rate and regular rhythm. S1 and S2 normal without gallop, murmur, click, rub or other extra sounds.no lifts.   Abdomen:  Bowel sounds positive,abdomen soft  and non-tender without masses, organomegaly or hernias noted. Msk:  no joint swelling and no joint warmth.   OA chnage Pulses:  pulses intact without delay    nl cap refill  Extremities:  no clubbing cyanosis   trc pretibail edema  Neurologic:  alert & oriented X3, strength normal in all extremities, and gait normal.   non focal  Skin:  turgor normal, color normal, and no ecchymoses.   angiomas no suspecious lesions Cervical Nodes:  No lymphadenopathy noted Axillary Nodes:  No palpable lymphadenopathy Inguinal Nodes:  No significant adenopathy Psych:  Normal eye contact, appropriate affect. Cognition appears normal.    labs reviewed  Impression & Recommendations:  Problem # 1:  HEALTH MAINTENANCE EXAM, ADULT (ICD-V70.0) counseled .   utd on immuniz and HC parameters   Problem # 2:  UNSPECIFIED ANEMIA (ICD-285.9) better  Her updated medication list for this problem includes:    Iron 325 (65 Fe) Mg Tabs (Ferrous sulfate)  Hgb: 12.6 (10/05/2010)   Hct: 36.8 (10/05/2010)   Platelets: 211.0 (10/05/2010) RBC: 3.78 (10/05/2010)   RDW: 14.1 (10/05/2010)   WBC: 3.1 (10/05/2010) MCV: 97.5 (10/05/2010)   MCHC: 34.2 (10/05/2010) Ferritin: 31.9 (10/15/2009) Iron: 76 (10/15/2009)   % Sat: 21.0 (10/15/2009) B12: 1228 (11/26/2008)   Folate: 19.2 (11/08/2006)   TSH: 1.24  (10/05/2010)  Problem # 3:  RAYNAUD'S SYNDROME, HX OF (ICD-V12.59) on norvasc for this   no progression  Problem # 4:  LEUKOPENIA, CHRONIC (ICD-288.50) ongogin without symptoms  of infections   Problem # 5:  OSTEOARTHRITIS (ICD-715.90) meds as needed.  Her updated medication list for this problem includes:    Aspirin 81 Mg Tbec (Aspirin) ..... Once daily    Celebrex 200 Mg Caps (Celecoxib) .Marland Kitchen..Marland Kitchen Two times a day for one month then 1 by mouth once daily    Tramadol Hcl 50 Mg Tabs (Tramadol hcl) .Marland Kitchen... 1 by mouth q 4-6 hours a  day as needed  for pain    Tylenol Extra Strength 500 Mg Tabs (Acetaminophen) .Marland Kitchen... As needed  Problem # 6:  GERD (ICD-530.81)  Her updated medication list for this problem includes:    Zegerid Otc 20-1100 Mg Caps (Omeprazole-sodium bicarbonate) .Marland Kitchen... 2 by mouth once daily    Gaviscon Acid Brkthrgh Formula 500 Mg Chew (Calcium carbonate antacid) .Marland Kitchen... As needed  Problem # 7:  SLEEP APNEA, OBSTRUCTIVE (ICD-327.23)  Problem # 8:  OTHER SPECIFIED DISORDERS OF LIVER ABN MRI (ICD-573.8) no change and cw hemangioma  on last  imaging   Complete Medication List: 1)  Cymbalta 60 Mg Cpep (Duloxetine hcl) .Marland Kitchen.. 1 by mouth once daily 2)  Valtrex 1 Gm Tabs (Valacyclovir hcl) .... As needed 3)  Altace 10 Mg Tabs (Ramipril) .... Take 1 tablet by mouth once a day 4)  Aspirin 81 Mg Tbec (Aspirin) .... Once daily 5)  Zegerid Otc 20-1100 Mg Caps (Omeprazole-sodium bicarbonate) .... 2 by mouth once daily 6)  Gaviscon Acid Brkthrgh Formula 500 Mg Chew (Calcium carbonate antacid) .... As needed 7)  Vivelle-dot 0.0375 Mg/24hr Pttw (Estradiol) .... Change every 4 days 8)  Multivitamins Tabs (Multiple vitamin) 9)  Caltrate 600+d 600-400 Mg-unit Tabs (Calcium carbonate-vitamin d) .... One time a day 10)  Rhinocort Aqua 32 Mcg/act Susp (Budesonide (nasal)) .... As needed 11)  Claritin 10 Mg Tabs (Loratadine) .... As needed 12)  Metrogel 1 % Gel (Metronidazole) .... To face once  daily 13)  Finacea 15 % Gel (Azelaic acid) .... To face 14)  Nitroquick 0.4 Mg  Subl (Nitroglycerin) .... As needed 15)  Celebrex 200 Mg Caps (Celecoxib) .... Two times a day for one month then 1 by mouth once daily 16)  Tramadol Hcl 50 Mg Tabs (Tramadol hcl) .Marland Kitchen.. 1 by mouth q 4-6 hours a  day as needed  for pain 17)  Tylenol Extra Strength 500 Mg Tabs (Acetaminophen) .... As needed 18)  Ativan 1 Mg Tabs (Lorazepam) .... Take 1 tablet pre procedure 19)  Coreg 3.125 Mg Tabs (Carvedilol) .Marland Kitchen.. 1 by mouth two times a day 20)  Vivelle-dot 0.05 Mg/24hr Pttw (Estradiol) .... Alt with .0375mg  21)  Iron 325 (65 Fe) Mg Tabs (Ferrous sulfate) 22)  Lipitor 20 Mg Tabs (Atorvastatin calcium) .... Take 1 by mouth once daily 23)  Norvasc 5 Mg Tabs (Amlodipine besylate) .... Take 1 by mouth once daily 24)  Vitamin D3 2000 Unit Caps (Cholecalciferol) .... Take 1 by mouth once daily 25)  Cpap 11 Advanced   Other Orders: Admin 1st Vaccine (16109) Flu Vaccine 34yrs + (60454)  Patient Instructions: 1)  call when due for colonscopy . 2)  can stop the iron for now.  3)  and recheck  ibc ferritin and cbc diff in  4-6 months .  dx  leukopenia .  Prescriptions: CELEBREX 200 MG  CAPS (CELECOXIB) two times a day for one month then 1 by mouth once daily  #120 x 1   Entered and Authorized by:   Madelin Headings MD   Signed by:   Madelin Headings MD on 10/12/2010   Method used:   Faxed to ...       [facility] (mail-order)             , [place]         Ph: [phone]       Fax: [phone]   RxID:   [id number]    Orders Added: 1)  Admin 1st Vaccine [90471] 2)  Flu Vaccine 68yrs + [44010] 3)  Est. Patient 65& > [27253] 4)  Est. Patient Level II [66440]             Flu Vaccine Consent Questions     Do you have a history of severe allergic reactions to this vaccine? no    Any prior history of allergic reactions to egg and/or gelatin? no    Do you have a sensitivity to the preservative Thimersol? no     Do you have a past history of Guillan-Barre Syndrome? no    Do you currently have an acute febrile illness? no    Have you ever had a severe reaction to latex? no    Vaccine information given and explained to patient? yes    Are you currently pregnant? no    Lot Number:[id number]   Exp Date:06/04/2011   Site Given  Left Deltoid IM  .lbflu Romualdo Bolk, CMA (AAMA)  October 12, 2010 10:17 AM

## 2011-01-04 NOTE — Progress Notes (Signed)
Summary: note for flight  Phone Note Call from Patient Call back at Home Phone [phone]   Caller: Patient Summary of Call: Pt needs a note on letterhead saying that need has a medical need for a CPAP machine. Pt is going to be flying to [place] next week. Initial call taken by: Romualdo Bolk, CMA,  August 15, 2008 4:55 PM  Follow-up for Phone Call        Pt's husband aware that note is ready to pick up. Follow-up by: Romualdo Bolk, CMA,  August 18, 2008 4:50 PM

## 2011-01-04 NOTE — Progress Notes (Signed)
Summary: CPAP auto download- change to 11 cwp  Phone Note Other Incoming   Summary of Call: CPAP titration download : 11 cwp AHI 3.8. good compliance and control. Initial call taken by: Waymon Budge MD,  May 31, 2010 8:08 PM    New/Updated Medications: * CPAP 11 ADVANCED

## 2011-01-04 NOTE — Consult Note (Signed)
Summary: [facility]   Imported By: Maryln Gottron 06/25/2008 13:51:18  _____________________________________________________________________  External Attachment:    Type:   Image     Comment:   External Document

## 2011-01-04 NOTE — Progress Notes (Signed)
Summary: [facility]ills to [facility]  Phone Note From Pharmacy   Caller: [facility] Reason for Call: Needs renewal Details for Reason: Celebrex, cymbalta and valacyclovir Summary of Call: I spoke to pt and she states that it is okay to send [facility]. Initial call taken by: Romualdo Bolk, CMA (AAMA),  May 05, 2010 5:28 PM  Follow-up for Phone Call        please do this enough for 6 months  Follow-up by: Madelin Headings MD,  May 05, 2010 9:59 PM  Additional Follow-up for Phone Call Additional follow up Details #1::        Rx sent to pharmacy. Additional Follow-up by: Romualdo Bolk, CMA (AAMA),  May 06, 2010 9:46 AM    Prescriptions: CELEBREX 200 MG  CAPS (CELECOXIB) once daily  #90 x 1   Entered by:   Romualdo Bolk, CMA (AAMA)   Authorized by:   Madelin Headings MD   Signed by:   Romualdo Bolk, CMA (AAMA) on 05/06/2010   Method used:   Electronically to        [facility] (mail-order)             ,          Ph: [phone]       Fax: [phone]   RxID:   [id number] VALTREX 1 GM TABS (VALACYCLOVIR HCL) as needed  #90 x 1   Entered by:   Romualdo Bolk, CMA (AAMA)   Authorized by:   Madelin Headings MD   Signed by:   Romualdo Bolk, CMA (AAMA) on 05/06/2010   Method used:   Electronically to        [facility]* (mail-order)             ,          Ph: [phone]       Fax: [phone]   RxID:   [id number] CYMBALTA 60 MG CPEP (DULOXETINE HCL) 1 by mouth once daily  #90 x 1   Entered by:   Romualdo Bolk, CMA (AAMA)   Authorized by:   Madelin Headings MD   Signed by:   Romualdo Bolk, CMA (AAMA) on 05/06/2010   Method used:   Electronically to        [facility]* (mail-order)             ,          Ph: [phone]       Fax: [phone]   RxID:   [id number]

## 2011-01-04 NOTE — Assessment & Plan Note (Signed)
Summary: TALK ABOUT MRI/MHF   Vital Signs:  Patient Profile:   65 Years Old Female Weight:      237 pounds Pulse rate:   88 / minute BP sitting:   128 / 79                 History of Present Illness: Terri Rodriguez  comes in today for follow up multiple med problems .Marland Kitchen  Has lesion on liver on mri > atypical hemangioma No change July 2009. Pt brings report for review.    due for last repeat 4-6 months  . no signs but end of year better financially.   [facility] spine   :  s/p surgery   improving  and iIn PT for back.   decreased lbp since surgery.    still takes celebrex q d as needed   no noted side effect   GI : had colonoscopy and hd small polyp this time adenomatous?    on 3-5 year recall.   Thinking needs to change GI  to [facility] . Recurrent HL   asks about cost of valtrex   CV : had CC last year  and ok.  No new signs and on meds for BP and lipids  Mood: stable  on meds.   WEight: aware of increase and need to decrease .      Current Allergies: No known allergies   Past Medical History:    Allergic rhinitis    GERD    Hyperlipidemia    Hypertension    Osteoarthritis, mild    degenerative joint disease    Raynauds    Cholelithiaisis        had Cardiac cath in Dec for abn ekg? and normal arteries Dr Tresa Endo      CONSULTANTS     Lorelee Cover          Physical Exam  General:     Well-developed,well-nourished,in no acute distress; alert,appropriate and cooperative throughout examination Head:     normocephalic and atraumatic.   Neck:     No deformities, masses, or tenderness noted. Lungs:     Normal respiratory effort, chest expands symmetrically. Lungs are clear to auscultation, no crackles or wheezes.no dullness.   Heart:     Normal rate and regular rhythm. S1 and S2 normal without gallop, murmur, click, rub or other extra sounds. Abdomen:     Bowel sounds positive,abdomen soft and non-tender without masses, organomegaly or hernias noted. Msk:  well healed scar on back Extremities:     trace left pedal edema and trace right pedal edema.  pulses le eintact   Neurologic:     grossly non focal Skin:     turgor normal, color normal, and no ecchymoses.   Cervical Nodes:     No lymphadenopathy noted Psych:     Oriented X3, normally interactive, and good eye contact.      Impression & Recommendations:  Problem # 1:  ? of LIVER HEMANGIOMA (ICD-228.04) see last MRI report.  Orders: Radiology Referral (Radiology)   Problem # 2:  OTHER SPECIFIED DISORDERS OF LIVER ABN MRI (ICD-573.8) Assessment: Comment Only  Problem # 3:  OSTEOARTHRITIS (ICD-715.90) needs follow up labs Her updated medication list for this problem includes:    Aspirin 81 Mg Tbec (Aspirin) ..... Once daily    Celebrex 200 Mg Caps (Celecoxib) .Marland Kitchen... 1 by mouth two times a day    Tramadol Hcl 50 Mg Tabs (Tramadol hcl) .Marland KitchenMarland KitchenMarland KitchenMarland Kitchen 1  by mouth q 4-6 hours a  day as needed  for pain    Tylenol Extra Strength 500 Mg Tabs (Acetaminophen)    Lortab 7.5 7.5-500 Mg Tabs (Hydrocodone-acetaminophen) .Marland Kitchen... 1 by mouth qid as needed   Problem # 4:  HYPERTENSION (ICD-401.9)  Her updated medication list for this problem includes:    Altace 10 Mg Tabs (Ramipril) .Marland Kitchen... Take 1 tablet by mouth once a day    Caduet 5-20 Mg Tabs (Amlodipine-atorvastatin) .Marland Kitchen... Take 1 tablet by mouth once a day    Coreg 3.125 Mg Tabs (Carvedilol) .Marland Kitchen... 1 by mouth two times a day   Problem # 5:  HYPERLIPIDEMIA (ICD-272.4)  Her updated medication list for this problem includes:    Caduet 5-20 Mg Tabs (Amlodipine-atorvastatin) .Marland Kitchen... Take 1 tablet by mouth once a day   Problem # 6:  RAYNAUD'S SYNDROME, HX OF (ICD-V12.59) Assessment: Comment Only  Problem # 7:  LEUKOPENIA, CHRONIC (ICD-288.50) Assessment: Comment Only  Complete Medication List: 1)  Cymbalta 60 Mg Cpep (Duloxetine hcl) .Marland Kitchen.. 1 by mouth once daily 2)  Nexium 40 Mg Cpdr (Esomeprazole magnesium) 3)  Valtrex 1 Gm Tabs (Valacyclovir  hcl) 4)  Altace 10 Mg Tabs (Ramipril) .... Take 1 tablet by mouth once a day 5)  Caduet 5-20 Mg Tabs (Amlodipine-atorvastatin) .... Take 1 tablet by mouth once a day 6)  Aspirin 81 Mg Tbec (Aspirin) .... Once daily 7)  Zegerid 20-1100 Mg Caps (Omeprazole-sodium bicarbonate) .... Take 1 capsule by mouth once daily 8)  Gaviscon Acid Brkthrgh Formula 500 Mg Chew (Calcium carbonate antacid) .... As needed 9)  Vivelle-dot 0.0375 Mg/24hr Pttw (Estradiol) .... Change every 4 days 10)  Multivitamins Tabs (Multiple vitamin) 11)  Caltrate 600+d 600-400 Mg-unit Tabs (Calcium carbonate-vitamin d) 12)  Folic Acid 400 Mcg Tabs (Folic acid) 13)  Rhinocort Aqua 32 Mcg/act Susp (Budesonide (nasal)) .... As needed 14)  Claritin 10 Mg Tabs (Loratadine) .... As needed 15)  Metrogel-vaginal 0.75 % Gel (Metronidazole) .... Two times a day 16)  Finacea 15 % Gel (Azelaic acid) .... Two times a day 17)  Nitroquick 0.4 Mg Subl (Nitroglycerin) .... As needed 18)  Celebrex 200 Mg Caps (Celecoxib) .Marland Kitchen.. 1 by mouth two times a day 19)  Tramadol Hcl 50 Mg Tabs (Tramadol hcl) .Marland Kitchen.. 1 by mouth q 4-6 hours a  day as needed  for pain 20)  Tylenol Extra Strength 500 Mg Tabs (Acetaminophen) 21)  Lortab 7.5 7.5-500 Mg Tabs (Hydrocodone-acetaminophen) .Marland Kitchen.. 1 by mouth qid as needed 22)  Ativan 1 Mg Tabs (Lorazepam) .... Take 1 tablet pre procedure 23)  Coreg 3.125 Mg Tabs (Carvedilol) .Marland Kitchen.. 1 by mouth two times a day 24)  Vivelle-dot 0.05 Mg/24hr Pttw (Estradiol) .... Alt with .0375mg    Patient Instructions: 1)  plan yearly labs 2)  LIpids lfts, tsh, BMp,  CBC diff .  HSCRP  ..  vitamin d level  3)  272.4   401.9    288.9 4)  Will call for mri appt.  then follow up as needed.  will plan follow up depending on results. ]

## 2011-01-04 NOTE — Consult Note (Signed)
Summary: [facility]  [facility]   Imported By: Esmeralda Links D'jimraou 05/15/2008 15:20:42  _____________________________________________________________________  External Attachment:    Type:   Image     Comment:   External Document

## 2011-01-04 NOTE — Letter (Signed)
Summary: Dr Newell Coral note  Dr Newell Coral note   Imported By: Kassie Mends 12/18/2007 09:18:04  _____________________________________________________________________  External Attachment:    Type:   Image     Comment:   Dr Newell Coral note

## 2011-01-04 NOTE — Consult Note (Signed)
Summary: Dr Newell Coral note  Dr Newell Coral note   Imported By: Kassie Mends 07/03/2008 16:09:28  _____________________________________________________________________  External Attachment:    Type:   Image     Comment:   Dr Newell Coral note

## 2011-01-04 NOTE — Medication Information (Signed)
Summary: Coverage Approval for Rhinocort  Coverage Approval for Rhinocort   Imported By: Maryln Gottron 02/12/2010 10:27:13  _____________________________________________________________________  External Attachment:    Type:   Image     Comment:   External Document

## 2011-01-04 NOTE — Letter (Signed)
Summary: [facility]  [facility]   Imported By: Maryln Gottron 04/01/2009 13:59:33  _____________________________________________________________________  External Attachment:    Type:   Image     Comment:   External Document

## 2011-01-04 NOTE — Progress Notes (Signed)
Summary: FYI- Status on Post Op  Phone Note Call from Patient Call back at Home Phone [phone]   Caller: Patient Summary of Call: Pt called saying she survived 5 1/2 hour back surgery. She is doing fine and was in the hospital for 2 days. She is 1 week post op and has 7 weeks to go. Initial call taken by: Romualdo Bolk, CMA,  February 13, 2008 12:53 PM

## 2011-01-04 NOTE — Assessment & Plan Note (Signed)
Summary: painful elbow/njr   Vital Signs:  Patient Profile:   65 Years Old Female Weight:      214 pounds Pulse rate:   66 / minute BP sitting:   110 / 48  (left arm) Cuff size:   large  Vitals Entered By: Romualdo Bolk, CMA (September 04, 2007 2:53 PM)                 Chief Complaint:  Rt elbow pain and ?tennis elbow x 2 weeks.  History of Present Illness: Terri Rodriguez  comes in today because of  2 week persistent right lateral elbow tenderness that is now affecting her ability to griop and lift objects.  No specific injury but she is right handed and this feels like overuse to her.  She takes celebrex as needed her OA. Not resolving .  No systemic symptom . No new neuro symptom . Needs other options for pain relief.  Mood stable on Cymbalta but tends to bruise easy on asa plus med.    Current Allergies (reviewed today): No known allergies   Past Medical History:    Reviewed history from 08/17/2007 and no changes required:       Allergic rhinitis       GERD       Hyperlipidemia       Hypertension       Osteoarthritis, mild       degenerative joint disease       Raynauds   Social History:    Occupation:physician    Married    Never Smoked   Risk Factors:  Tobacco use:  never   Review of Systems       [place] as per hpi   Physical Exam  General:     alert, well-developed, well-nourished, and well-hydrated.   Msk:     tender at  right lateral epicondylenormal ROM, no joint swelling, no joint warmth, and no crepitation.  More pain with active rom with rotational movement   Pulses:     normal rue Extremities:     no swelling or weakness Neurologic:     grossly normal  Skin:     small ecchymosis on l forearm     Impression & Recommendations:  Problem # 1:  EPICONDYLITIS, LATERAL (ICD-726.32) reviewed options  will use her celebrex ice and relative rest, try stabilization strap.  follow up if not improving and can add ultram if needed although  not an antiinflammatory.  Advised against injection at present.   To disc with cardiologist about decreasing dose of ASA because of bleeeding rsik with serotonin meds  Complete Medication List: 1)  Cymbalta 60 Mg Cpep (Duloxetine hcl) 2)  Nexium 40 Mg Cpdr (Esomeprazole magnesium) 3)  Valtrex 1 Gm Tabs (Valacyclovir hcl) 4)  Altace 10 Mg Tabs (Ramipril) .... Take 1 tablet by mouth once a day 5)  Caduet 5-20 Mg Tabs (Amlodipine-atorvastatin) .... Take 1 tablet by mouth once a day 6)  Aspirin 81 Mg Tbec (Aspirin) .... Once daily 7)  Zegerid 20-1100 Mg Caps (Omeprazole-sodium bicarbonate) .... Take 1 capsule by mouth two times a day 8)  Gaviscon Acid Brkthrgh Formula 500 Mg Chew (Calcium carbonate antacid) .... As needed 9)  Vivelle-dot 0.0375 Mg/24hr Pttw (Estradiol) .... Change every 4 days 10)  Multivitamins Tabs (Multiple vitamin) 11)  Caltrate 600+d 600-400 Mg-unit Tabs (Calcium carbonate-vitamin d) 12)  Folic Acid 400 Mcg Tabs (Folic acid) 13)  Rhinocort Aqua 32 Mcg/act Susp (Budesonide (nasal)) .... As  needed 14)  Claritin 10 Mg Tabs (Loratadine) .... As needed 15)  Metrogel-vaginal 0.75 % Gel (Metronidazole) .... Two times a day 16)  Finacea 15 % Gel (Azelaic acid) .... Two times a day 17)  Nitroquick 0.4 Mg Subl (Nitroglycerin) .... As needed 18)  Celebrex 200 Mg Caps (Celecoxib) .Marland Kitchen.. 1 by mouth two times a day 19)  Tramadol Hcl 50 Mg Tabs (Tramadol hcl) .Marland Kitchen.. 1 by mouth q 4-6 hours a  day as needed  for pain     Prescriptions: TRAMADOL HCL 50 MG  TABS (TRAMADOL HCL) 1 by mouth q 4-6 hours a  day as needed  for pain  #60 x 0   Entered and Authorized by:   Madelin Headings MD   Signed by:   Madelin Headings MD on 09/04/2007   Method used:   Print then Give to Patient   RxID:   [id number]  ]

## 2011-01-04 NOTE — Letter (Signed)
Summary: [facility]  [facility]   Imported By: Maryln Gottron 06/25/2009 10:58:49  _____________________________________________________________________  External Attachment:    Type:   Image     Comment:   External Document

## 2011-01-04 NOTE — Letter (Signed)
Summary: [facility]  [facility]   Imported By: Maryln Gottron 07/02/2009 09:09:23  _____________________________________________________________________  External Attachment:    Type:   Image     Comment:   External Document

## 2011-01-06 NOTE — Progress Notes (Signed)
Summary: Pt req med for rash on back of lft hand-triage please  Phone Note Call from Patient Call back at Home Phone [phone]   Caller: Patient Summary of Call: Pt called and said that she has a rash on back on lft hand, that looks like ring worm. Pt says she has been treating with otc med and rash almost went away, but then med stopped working. Rash has returned and itches and red. Pt is req a med called in to [facility] on [address]. Initial call taken by: Lucy Antigua,  December 31, 2010 11:22 AM  Follow-up for Phone Call        Micheal Likens has she tried and is she asking for a tnea medication or steroid  Could it be granuloma annulare?  Follow-up by: Madelin Headings MD,  December 31, 2010 4:47 PM  Additional Follow-up for Phone Call Additional follow up Details #1::        Spoke to pt she has tried 3 different medications: athlete's foot stuff, almost went away with lotrium ultra. Pt doesn't think it is granuloma annulare. Can do lamistil, monistat or nyzoral Additional Follow-up by: Romualdo Bolk, CMA Duncan Dull),  December 31, 2010 4:56 PM    Additional Follow-up for Phone Call Additional follow up Details #2::    Nizoral cream( ketoconazole)  15 gram [facility].  refill x 1  to apply 1-2 x per day.  if not getting better than can check. Follow-up by: Madelin Headings MD,  December 31, 2010 5:00 PM  Additional Follow-up for Phone Call Additional follow up Details #3:: Details for Additional Follow-up Action Taken: Rx sent to pharmacy Additional Follow-up by: Romualdo Bolk, CMA (AAMA),  December 31, 2010 5:04 PM  New/Updated Medications: KETOCONAZOLE 2 % CREA (KETOCONAZOLE) apply 1-2 times a day Prescriptions: KETOCONAZOLE 2 % CREA (KETOCONAZOLE) apply 1-2 times a day  #15 x 1   Entered by:   Romualdo Bolk, CMA (AAMA)   Authorized by:   Madelin Headings MD   Signed by:   Romualdo Bolk, CMA (AAMA) on 12/31/2010   Method used:   Electronically to        [id number]* (retail)       [address] [place], [place]  [address]       Ph: [phone]       Fax: [phone]   RxID:   [id number]

## 2011-01-07 NOTE — Consult Note (Signed)
Summary: Dr Newell Coral note  Dr Newell Coral note   Imported By: Kassie Mends 01/22/2008 09:16:16  _____________________________________________________________________  External Attachment:    Type:   Image     Comment:   Dr Newell Coral note

## 2011-01-12 ENCOUNTER — Encounter: Payer: Self-pay | Admitting: Internal Medicine

## 2011-01-13 ENCOUNTER — Encounter: Payer: Self-pay | Admitting: Internal Medicine

## 2011-01-13 ENCOUNTER — Ambulatory Visit (INDEPENDENT_AMBULATORY_CARE_PROVIDER_SITE_OTHER): Payer: Medicare Other | Admitting: Internal Medicine

## 2011-01-13 VITALS — BP 100/60 | HR 78 | Wt 221.0 lb

## 2011-01-13 DIAGNOSIS — B354 Tinea corporis: Secondary | ICD-10-CM

## 2011-01-13 NOTE — Progress Notes (Signed)
  Subjective:    Patient ID: Terri Rodriguez, female    DOB: [birth year], 65 y.o.   MRN: [id number] Onset weeks ago see phone  note and multiple  topical antifungus meds ? If exposed to  Sis in laws cat  . No new meds  Very itchy . At one point had gone away with one of the creams but then came back despite using it. It has not spread and she has not used a steroid cream on this. No history of  exposures she does have a dermatologist who hasn't seen this at this pointHPI    Review of Systems    neg for fever bleeding new meds   No change  Objective:   Physical Exam      Well appeaing in Nad   Annular 1.5 cm  lesion with scaling and some central clearing on dorsum on left hand  No blisters  Head/scalp: brown to pink bumps no scaling or hair loss      Surface culture taken of rash . Extr  Some arthritis changes  No redness or warmth.  No atrophy. Assessment & Plan:  Annular   Rash   Partly resond to  One med  ? Dx  Ad topical steroid in case eczematous but could be resistant  dermatophyte   .

## 2011-01-13 NOTE — Patient Instructions (Addendum)
Add topical steroid  And lotrimin ultra      To the rash   Will let you know about results . If no better in 2 weeks call.

## 2011-01-17 ENCOUNTER — Other Ambulatory Visit: Payer: Self-pay | Admitting: Internal Medicine

## 2011-02-11 LAB — FUNGUS CULTURE W SMEAR: Smear Result: NONE SEEN

## 2011-02-15 ENCOUNTER — Encounter: Payer: Self-pay | Admitting: *Deleted

## 2011-02-17 LAB — POCT I-STAT 4, (NA,K, GLUC, HGB,HCT)
Glucose, Bld: 102 mg/dL — ABNORMAL HIGH (ref 70–99)
HCT: 36 % (ref 36.0–46.0)
Hemoglobin: 12.2 g/dL (ref 12.0–15.0)
Potassium: 3.9 mEq/L (ref 3.5–5.1)
Sodium: 139 mEq/L (ref 135–145)

## 2011-03-17 ENCOUNTER — Ambulatory Visit (INDEPENDENT_AMBULATORY_CARE_PROVIDER_SITE_OTHER): Payer: Medicare Other | Admitting: Internal Medicine

## 2011-03-17 ENCOUNTER — Encounter: Payer: Self-pay | Admitting: Internal Medicine

## 2011-03-17 DIAGNOSIS — J40 Bronchitis, not specified as acute or chronic: Secondary | ICD-10-CM

## 2011-03-17 MED ORDER — DOXYCYCLINE HYCLATE 100 MG PO TABS: 100.0000 mg | ORAL_TABLET | Freq: Two times a day (BID) | ORAL | Status: AC

## 2011-03-17 MED ORDER — BENZONATATE 100 MG PO CAPS: 100.0000 mg | ORAL_CAPSULE | Freq: Three times a day (TID) | ORAL | Status: AC | PRN

## 2011-03-20 ENCOUNTER — Encounter: Payer: Self-pay | Admitting: Internal Medicine

## 2011-03-20 DIAGNOSIS — J40 Bronchitis, not specified as acute or chronic: Secondary | ICD-10-CM | POA: Insufficient documentation

## 2011-03-20 NOTE — Progress Notes (Signed)
  Subjective:    Patient ID: Terri Rodriguez, female    DOB: 1946-03-18, 65 y.o.   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 Notes 8 day h/o of cough productive for green/yellow sputum without hemoptysis. Has associated laryngitis and nasal congestion without f/c, dyspnea or wheezing. +sick exposure. No alleviating or exacerbating factors.  No other complaints.  Reviewed pmh, medications and allergies.    Review of Systems see hpi     Objective:   Physical Exam  Vitals reviewed. Constitutional: She appears well-developed and well-nourished. No distress.  HENT:  Head: Normocephalic and atraumatic.  Right Ear: Tympanic membrane, external ear and ear canal normal.  Left Ear: Tympanic membrane, external ear and ear canal normal.  Nose: Nose normal.  Mouth/Throat: Oropharynx is clear and moist. No oropharyngeal exudate.  Eyes: Conjunctivae are normal. No scleral icterus.  Neck: Neck supple.  Cardiovascular: Normal rate, regular rhythm and normal heart sounds.   Pulmonary/Chest: Effort normal and breath sounds normal. No respiratory distress. She has no wheezes. She has no rales.  Lymphadenopathy:    She has no cervical adenopathy.  Neurological: She is alert.  Skin: Skin is warm and dry. She is not diaphoretic.          Assessment & Plan:

## 2011-03-20 NOTE — Assessment & Plan Note (Signed)
Begin doxycycline x10days. Tessalon perles prn cough. Followup if no improvement or worsening.

## 2011-03-21 ENCOUNTER — Encounter: Payer: Self-pay | Admitting: Internal Medicine

## 2011-03-21 ENCOUNTER — Other Ambulatory Visit: Payer: Self-pay | Admitting: Internal Medicine

## 2011-03-22 ENCOUNTER — Ambulatory Visit (INDEPENDENT_AMBULATORY_CARE_PROVIDER_SITE_OTHER): Payer: Medicare Other | Admitting: Internal Medicine

## 2011-03-22 ENCOUNTER — Encounter: Payer: Self-pay | Admitting: Internal Medicine

## 2011-03-22 VITALS — BP 108/72 | HR 73 | Ht 68.75 in | Wt 225.0 lb

## 2011-03-22 DIAGNOSIS — G4733 Obstructive sleep apnea (adult) (pediatric): Secondary | ICD-10-CM

## 2011-03-22 NOTE — Patient Instructions (Addendum)
You are doing great - please let me know if you have problems.  We will continue 12 cwp.

## 2011-03-22 NOTE — Assessment & Plan Note (Addendum)
She is doing very well and remains fully compliant with CPAP which is effective. We will continue with AutoPAP max of range 12 cwp. Weight loss would help a lot as advised.

## 2011-03-22 NOTE — Telephone Encounter (Signed)
Per Dr. Panosh- ok x 2  

## 2011-03-22 NOTE — Progress Notes (Signed)
  Subjective:    Patient ID: Terri Rodriguez, female    DOB: 1946-10-17, 65 y.o.   MRN: [id number]  HPI 89 yoF followed for obstructive sleep apnea on CPAP 11 cwp, complicated by allergic rhinitis and GERD. Last here March 23, 2010. She recently got over a sustained tracheobronchits and remains hoarse. After last here we were able to help her change DME to [facility] and get her a new machine. She also wears a bite guard. She is able to wear her mask all night every night and life is clearly better with it.    Review of Systems See HPI Constitutional:   No weight loss, night sweats,  Fevers, chills, fatigue, lassitude. HEENT:   No headaches,  Difficulty swallowing,  Tooth/dental problems,  Sore throat,     Hoarse per HPI               No sneezing, itching, ear ache, nasal congestion, post nasal drip,   CV:  No chest pain,  Orthopnea, PND, swelling in lower extremities, anasarca, dizziness, palpitations  GI  No heartburn, indigestion, abdominal pain, nausea, vomiting, diarrhea, change in bowel habits, loss of appetite  Resp: No shortness of breath with exertion or at rest.  No excess mucus, no productive cough,  No non-productive cough,  No coughing up of blood.  No change in color of mucus.  No wheezing.  No chest wall deformity  Skin: no rash or lesions.  GU: no dysuria, change in color of urine, no urgency or frequency.  No flank pain.  MS:  No joint pain or swelling.  No decreased range of motion.  No back pain.  Psych:  No change in mood or affect. No depression or anxiety.  No memory loss.       Objective:   Physical Exam General- Alert, Oriented, Affect-appropriate, Distress- none acute  Skin- rash-none, lesions- none, excoriation- none  Lymphadenopathy- none  Head- atraumatic  Eyes- Gross vision intact, PERRLA, conjunctivae clear secretions  Ears- Hearing Normal  canals, Tm L , R ,  Nose- Clear,  No- Septal dev, mucus, polyps, erosion, perforation   Throat- Mallampati  III , full tongue, mucosa clear , drainage- none, tonsils- atrophic  Neck- flexible , trachea midline, no stridor , thyroid nl, carotid no bruit  Chest - symmetrical excursion , unlabored     Heart/CV- RRR , no murmur , no gallop  , no rub, nl s1 s2                     - JVD- none , edema- none, stasis changes- none, varices- none     Lung- clear to P&A, wheeze- none, cough- none , dullness-none, rub- none     Chest wall-  Abd- tender-no, distended-no, bowel sounds-present, HSM- no  Br/ Gen/ Rectal- Not done, not indicated  Extrem- cyanosis- none, clubbing, none, atrophy- none, strength- nl  Neuro- grossly intact to observation         Assessment & Plan:

## 2011-03-27 ENCOUNTER — Encounter: Payer: Self-pay | Admitting: Internal Medicine

## 2011-04-11 ENCOUNTER — Other Ambulatory Visit (INDEPENDENT_AMBULATORY_CARE_PROVIDER_SITE_OTHER): Payer: Medicare Other | Admitting: Internal Medicine

## 2011-04-11 DIAGNOSIS — Z862 Personal history of diseases of the blood and blood-forming organs and certain disorders involving the immune mechanism: Secondary | ICD-10-CM

## 2011-04-11 DIAGNOSIS — D72819 Decreased white blood cell count, unspecified: Secondary | ICD-10-CM

## 2011-04-11 LAB — CBC WITH DIFFERENTIAL/PLATELET
Basophils Absolute: 0 K/uL (ref 0.0–0.1)
Basophils Relative: 0.6 % (ref 0.0–3.0)
Eosinophils Absolute: 0.2 K/uL (ref 0.0–0.7)
Eosinophils Relative: 5.1 % — ABNORMAL HIGH (ref 0.0–5.0)
HCT: 37.6 % (ref 36.0–46.0)
Hemoglobin: 13.1 g/dL (ref 12.0–15.0)
Lymphocytes Relative: 38.5 % (ref 12.0–46.0)
Lymphs Abs: 1.2 K/uL (ref 0.7–4.0)
MCHC: 34.8 g/dL (ref 30.0–36.0)
MCV: 98.5 fl (ref 78.0–100.0)
Monocytes Absolute: 0.3 K/uL (ref 0.1–1.0)
Monocytes Relative: 8.9 % (ref 3.0–12.0)
Neutro Abs: 1.5 K/uL (ref 1.4–7.7)
Neutrophils Relative %: 46.9 % (ref 43.0–77.0)
Platelets: 218 K/uL (ref 150.0–400.0)
RBC: 3.82 Mil/uL — ABNORMAL LOW (ref 3.87–5.11)
RDW: 14.3 % (ref 11.5–14.6)
WBC: 3.2 K/uL — ABNORMAL LOW (ref 4.5–10.5)

## 2011-04-11 LAB — IBC PANEL
Iron: 91 ug/dL (ref 42–145)
Saturation Ratios: 21.7 % (ref 20.0–50.0)
Transferrin: 299.1 mg/dL (ref 212.0–360.0)

## 2011-04-11 LAB — FERRITIN: Ferritin: 23.4 ng/mL (ref 10.0–291.0)

## 2011-04-13 ENCOUNTER — Other Ambulatory Visit: Payer: Self-pay

## 2011-04-19 NOTE — Op Note (Signed)
NAME:  Terri Rodriguez, Terri Rodriguez NO.:  [id number]   MEDICAL RECORD NO.:  [id number]          PATIENT TYPE:  INP   LOCATION:  3001                         FACILITY:  [facility]   PHYSICIAN:  Hewitt Shorts, M.D.DATE OF BIRTH:  [birth year]   DATE OF PROCEDURE:  02/06/2008  DATE OF DISCHARGE:                               OPERATIVE REPORT   PREOPERATIVE DIAGNOSES:  1. Static degenerative grade 1 spondylolisthesis at L4-L5.  2. Left L4 lumbar disk herniation.  3. Multifactorial multilevel lumbar stenosis at L3-L4 and L4-L5.  4. Lumbar spondylolysis.  5. Lumbar degenerative disk disease.  6. Lumbar radiculopathy.   POSTOPERATIVE DIAGNOSES:  1. Static degenerative grade 1 spondylolisthesis at L4-L5.  2. Left L4 lumbar disk herniation.  3. Multifactorial multilevel lumbar stenosis at L3-L4 and L4-L5.  4. Lumbar spondylolysis.  5. Lumbar degenerative disk disease.  6. Lumbar radiculopathy.   PROCEDURES:  L3 to L5 decompressive lumbar laminectomy, bilateral L3-L4  and L4-L5 lumbar facetectomy and foraminotomy with decompression of the  exiting L3, L4, and L5 nerve roots bilaterally; left L4 microdiskectomy  with microdissection; and bilateral L3-L4 and L4-L5 posterior lumbar  interbody fusion with AVS PEEK interbody implants and mosaic synthetic  bone graft with bone marrow aspirate and bilateral L3 to L5  posterolateral arthrodesis with radius posterior instrumentation, mosaic  with bone marrow aspirate, and locally harvested, morcellized autograft.   SURGEON:  Hewitt Shorts, M.D.   ASSISTANT:  Clydene Fake, M.D.   ANESTHESIA:  General endotracheal.   INDICATION:  The patient is a 65 year old woman who presented with low  back and radicular pain.  She was found to have extensive degenerative  changes of the lumbar spine.  There was a left L4 disk herniation behind  body of L4 that was suspected to be the source of her acute syndrome,  but she had significant  degenerative changes at the L3-L4 and L4-L5  levels and a decision was made to proceed with decompression and  arthrodesis.   DESCRIPTION OF PROCEDURE:  The patient was brought to the operating room  and placed under general endotracheal anesthesia.  The patient was  turned to prone position.  Lumbar region was prepped with Betadine soap  and solution and draped in a sterile fashion.  The midline was  infiltrated with local anesthetic with epinephrine, and then a midline  incision was made, carried down to the subcutaneous tissue.  Bipolar  cautery and electrocautery were used to maintain hemostasis.  Dissection  was carried down to the lumbar fascia which was incised bilaterally and  the paraspinal muscles were dissected from the spinous process and  lamina in a subperiosteal fashion.  Self-retaining retractor was placed  and a number of x-rays were taken to localize the L3-L4 and L4-L5  interlaminar space.  Once these were confirmed, dissection was carried  out laterally over the hypertrophic facet complexes exposing the L3, L4,  and L5 transverse processes bilaterally.  Later in the case, these  transverse processes were decorticated to promote arthrodesis.   With magnification, we proceeded with the decompression performing an  inferior L3, a complete L4, and a superior L5 lumbar laminectomy using  double-action rongeurs, the X-Max drill, and Kerrison punches.  Portions  of bone of good quality were cleaned to soft tissue, morcellized to  later be used as autograft material for the posterolateral arthrodesis.   We then proceeded with lateral decompression of the exiting nerve roots  performing bilateral facetectomies and foraminotomies at L3-L4 and L4-  L5, and we were able to decompress the exiting L3, L4, and L5 nerve  roots bilaterally.   We then carefully retracted the thecal sac and left L4 nerve root  exposing the disk herniation behind the body of L4 directly ventral to   the nerve root sleeve, and we were able to mobilize these fragments from  the surrounding epidural tissues.   We then proceeded with posterior lumbar interbody fusion bilaterally at  L3-L4 and L4-L5.  There was broad-based spondylotic disk protrusion at  each level.  The annulus was incised.  We entered into the disk space.  The disk material was degenerated, and the degenerated disk material was  removed.  We then removed posterior spondylitic overgrowth from the  posterior aspects of the L3 and L4 and L5 vertebrae, and then we used  various sizers to begin to open the disk space at each level.  We then  began to continue the diskectomy at each level using a variety of Micro  curettes and pituitary rongeurs.  We then used paddle curettes to remove  the cartilaginous endplate surfaces down to a good bony surface and then  measured the heights of the intervertebral disk spaces at each level  deciding upon using 7-mm implants bilaterally at each level.  These were  with 4 degrees of lordosis and 8 mm of width.   We then probed the left L5 pedicle.  Bone marrow aspirate was obtained  and injected over a 15 mL strip of mosaic synthetic bone graft material  and then each of the PEEK interbody implants was packed with the mosaic  with bone marrow aspirate.  Then, the implants were first placed on the  right side at each level carefully retracting the thecal sac and nerve  root, they were countersunk.  We then went to the left side, packed  additional mosaic with bone marrow aspirate in the midline and then  placed a second implant on the left side carefully retracting the thecal  sac and nerve root again.  Once both implants were in place, we  proceeded with posterolateral arthrodesis.   The C-arm fluoroscope was draped and brought to the field to provide  guidance in placing the posterior instrumentation.  Pedicle entry sites  were identified bilaterally at L3, L4, and L5.  Each of the  pedicles was  probed, examined with a ball probe.  Good bony surfaces were noted.  We  then tapped each of the pedicles with a 5.25-mm tap, again examined it  with a ball probe, good threading was noted, no cutouts were found and  then we placed 5.75 x 45 mm screws bilaterally at L3, L4, and L5.  Lateral view showed the screws in good position as did AP view.  We then  selected 60-mm prelordosed rods that were placed within the screw heads  and secured with locking caps which were subsequently tightened against  counter-torque.  We then exposed the transverse process of L3, L4, and  L5 that had previously been decorticated, packing mosaic with bone  marrow aspirate over the transverse  process and intertransverse space  and then packing the morcellized locally harvested autograft over the  mosaic with bone marrow aspirate.  All this was gently packed with  finger packing, and then we inspected the spinal canal to ensure that  none of the bone graftmaterial had fallen within the canal.  I  reexamined the thecal sac and nerve root, they were well decompressed.  We then placed a crosslink between the L4 and L5 screws.  We used a  medium size crosslink.  It was secured to the rods and then tightened in  the center.  The wound was then closed.  It had been irrigated numerous  times in the procedure with saline solution and subsequently with  bacitracin solution, and we approximated the paraspinal muscles with  interrupted undyed #1 Vicryl suture.  The deep fascia was closed with  interrupted undyed #1 Vicryl suture.  The Scarpa fascia was closed with  interrupted undyed #1 Vicryl sutures.  The subcutaneous subcuticular was  closed with interrupted inverted #2-0 undyed Vicryl sutures.  The skin  was reapproximated with surgical staples.  The wound was dressed with  Adaptic and sterile gauze.  Procedure was tolerated well.  The estimated  blood loss was 450 mL .  We were able to return 145 mL of  Cell Saver  blood to the patient.  Sponge and needle counts were correct.  Following  the surgery, the patient was turned back to the supine position and  reversed from the anesthetic, extubated, and transferred to the recovery  room for further care where she was noted to be moving all 4 extremities  to command.      Hewitt Shorts, M.D.  Electronically Signed     RWN/MEDQ  D:  02/06/2008  T:  02/07/2008  Job:  [id number]

## 2011-04-19 NOTE — H&P (Signed)
NAME:  Terri Rodriguez, QUE NO.:  [id number]   MEDICAL RECORD NO.:  [id number]          PATIENT TYPE:  INP   LOCATION:  3001                         FACILITY:  [facility]   PHYSICIAN:  Hewitt Shorts, M.D.DATE OF BIRTH:  Feb 09, 1946   DATE OF ADMISSION:  02/06/2008  DATE OF DISCHARGE:                              HISTORY & PHYSICAL   HISTORY OF PRESENT ILLNESS:  The patient is a 65 year old right-handed  white female whom I have evaluated for both her neck and her lower back.  She has been having increasing difficulties with the low  back since  September 2008 with pain in the low back extending down through the left  lower extremity, into the left buttock, thigh, and leg.  The patient was  treated with Prednisone Dosepak and subsequently with variety of  medications including hydrocodone, Celebrex, and tramadol.   The patient was evaluated with x-rays and MRI scan.  X-rays revealed  multilevel degenerative disease with spondylosis, particularly at the L4-  5 level with disk space collapse on the left side contributing to a  degenerative scoliosis.  Lateral view shows a grade 1 degenerative  spondylolisthesis at L4-5, it appears static through flexion and  extension.   MRI reveals significant degenerative changes at the L3-4 to L4-5 levels  with degenerative disk disease, circumferential disk bulging, and  multifactorial multilevel lumbar stenoses.  There is facet arthropathy  at L4-5, worse at L3-4 and again at L4-5 is noted a grade 1 degenerative  spondylolisthesis.  Also noted though is a herniated disk fragment on  the left side behind the body of L4 which is felt to be the cause of her  acute radiculopathy that developed in September.   The patient is admitted now for decompression and stabilization at the  L3-4 and L4-5 levels.   PAST MEDICAL HISTORY:  Notable for:  1. History of gastroesophageal reflux disease.  2. Hiatal hernia.  3. Obstructive sleep  apnea.  She does not describe any history of hypertension, myocardial  infarction, cancer, stroke, diabetes, or lung disease.   PAST SURGICAL HISTORY:  Previous surgeries include:  1. Tonsillectomy and adenoidectomy in 1952.  2. Cesarean section in 1982.  3. TAH-BSO in 1993.  4. Septoplasty in 2001.  5. Right knee arthroscopy in 2006.  6. Cardiac catheterization by Dr. Nicki Guadalajara in May 2003 and again      in January 2009.   ALLERGIES:  She has no allergies to medications but codeine can cause  nausea and vomiting, and hydrocodone can cause some nausea.   CURRENT MEDICATIONS:  Altace, Caduet, aspirin, nitroglycerin p.r.n.,  Zegerid, Gaviscon p.r.n., Vivelle-Dot, Centrum Silver,  Caltrate with  vitamin D,  folic acid, Rhinocort nasal spray, Claritin, MetroGel,  Cymbalta, Celebrex, Tramadol, hydrocodone, and Tylenol.   FAMILY HISTORY:  Parents passed on, mother at age 19, father at age 54.  Her mother had diabetes, rheumatoid arthritis, myocardial infarction,  congestive heart failure, ITP, and cauda equina syndrome.  Father had  atherosclerotic cardiovascular disease and multi-infarct dementia.  There is also family history of hypercholesterolemia and obesity.  SOCIAL HISTORY:  The patient is a retired Development worker, community.  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  She does not smoke.  She  drinks alcoholic beverages socially.  She typically has a glass or two  of wine each day.  She denies any history of substance abuse.  She is  married.   REVIEW OF SYSTEMS:  Notable for those described in history of present  illness and past medical history, but review of systems is otherwise  unremarkable.   PHYSICAL EXAMINATION:  The patient is a well-developed, well-nourished  white female in no acute distress.  Her temperature is 98.3, pulse 67, blood pressure 122/63, respiratory  rate 18, height 5 feet 7, weight 99 kilograms.  LUNGS:  Clear to auscultation.  She has  symmetrical respiratory  excursion.  HEART:  Regular rate and rhythm.  Normal S1, S2.  There is no murmur.  EXTREMITIES:  Examination shows no clubbing, cyanosis, or edema.  MUSCULOSKELETAL:  Examination shows she is able to flex 90 degrees.  She  has some pain with extension.  Extension is limited  to about 10  degrees.  Mild tenderness to palpation in left paralumbar region,  minimal in the lumbar spinous process or right paralumbar region.  Straight leg raising is negative bilaterally.  NEUROLOGIC:  Examination shows 5/5 strength in lower  extremitiesincluding the iliopsoas, quadriceps, dorsiflexor, extensor  hallucis longus, and plantar flexor.  Sensation is diminished to  pinprick in the left foot and intact pinprick in the right foot.  Reflexes are minimal in the biceps, brachialis, triceps,  absent in the  quadriceps, gastrocnemius symmetrical.  Bilateral toes are downgoing  bilaterally.  She has normal gait and stance.   IMPRESSION:  The patient with left lumbar radiculopathy and low back  pain.  She has grade 1 static degenerative spondylolisthesis at L4-5,  significant degenerative disk disease and spondylolisthesis at L3-4 and  L4-5 with circumferential disk bulging, facet arthropathy, and  hypertrophy with resultant multilevel multifactorial lumbar stenosis at  L3-4 and L4-5 and also a left L4 disk herniation that is extended  rostrally behind the body of L4.   PLAN:  The patient  will be admitted for decompression and  stabilization, specifically bilateral L3-4 and L4-5 lumbar  decompression, a left L4 diskectomy and bilateral L3-4 and L4-5  interbody fusion with interbody implants and bone graft, and bilateral  L3 to L5 posterolateral arthrodesis with posterior instrumentation and  bone graft.   We discussed the nature of her condition, alternatives for treatment,  the nature of the surgery, plans for postoperative immobilization in a  lumbar brace, and risks including  risks of infection, bleeding, possible  need for transfusion, risk of nerve dysfunction, pain, weakness,  numbness or paresthesias, the risk of dural tear, CSF leakage, possible  need for further surgery, risk of failure of arthrodesis with possible  need for further surgery, and anesthetic risks of myocardial infarction,  stroke, pneumonia and death.  We explained that we would use a Cell  Saver during surgery.  Understanding all of this, she wished to go ahead  with surgery and is admitted for such.      Hewitt Shorts, M.D.  Electronically Signed    RWN/MEDQ  D:  02/06/2008  T:  02/07/2008  Job:  [id number]

## 2011-04-19 NOTE — Cardiovascular Report (Signed)
NAME:  TREASURE, [facility] NO.:  [id number]   MEDICAL RECORD NO.:  [id number]          PATIENT TYPE:  OIB   LOCATION:  2899                         FACILITY:  [facility]   PHYSICIAN:  Nicki Guadalajara, M.D.     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  In May 2003 she developed an episode of squeezing  substernal chest pain and cardiac catheterization revealed tubular  narrowing of the proximal LAD of 30-50%.  She has a history of  hypertension, hyperlipidemia and has been on aggressive medical therapy  in an attempt to optimize endothelial function.  She has subsequently  developed severe degenerative disk disease involving L3-L4 and L4-L5  disks with broken portions of the disk leading to significant  paresthesias to her left leg.  She has a history of obstructive sleep  apnea and utilizing CPAP at 11 cm water pressure.  She underwent  preoperative clearance for her back surgery with a Persantine Myoview  scan on November 09, 2007.  This scan was different from her prior study  and suggested a medium-sized mild-to-moderate region of possible mild  ischemia, which was not present on her 2006 scan.  Consequently,  definitive diagnostic cardiac catheterization was recommended  preoperatively prior to her planned back surgery.   PROCEDURE:  After premedication with Versed 2 mg intravenously, the  patient was prepped and draped in the usual fashion.  Her right femoral  artery was punctured anteriorly and a 5-French sheath was inserted  without difficulty.  Diagnostic cardiac catheterization was done  utilizing 5-French Judkins for left and right coronary catheters.  A 5-  French pigtail catheter was also used for biplane  __________ as well as  distal aortography.  Hemostasis was obtained by direct manual pressure.  The patient tolerated the procedure well.   HEMODYNAMIC DATA:  Central aortic pressure was 108/59.  Left ventricular  pressure was 108/4.   ANGIOGRAPHIC DATA:  Left main coronary artery was a short normal vessel,  which bifurcated into an LAD and left circumflex system.   The LAD gave rise to a proximal septal perforating artery and proximal  diagonal vessel.  The previously noted proximal portion of the LAD,  which had tubular narrowing from the catheterization of 2003 now  appeared angiographically normal.  The LAD was a moderate-sized vessel.  There was a normal LAD beyond the diagonal vessel and the distal LAD was  small caliber and wrapped around the LV apex.   The circumflex vessel gave rise to one major marginal vessel but was  angiographically normal.   The right coronary artery was a dominant angiographically normal vessel  that gave rise to a PDA, PLA and several inferior LV branches.   Biplane __________ revealed normal LV contractility.  There was no  evidence for any wall motion abnormalities.  There was suggestion of  mild angiographic mitral  valve prolapse.  There was not any evidence for  mitral regurgitation.   Distal aortography revealed a normal aortoiliac system.  Specifically,  there was no evidence for renal artery stenosis or aneurysmal  dilatation.   IMPRESSION:  1. Normal LV function.  2. Mild angiographic mitral valve prolapse.  3. Normal coronary arteries.   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  Cardiac catheterization in 2003  did demonstrate a tubular narrowing most likely representing coronary  vasospasm.  Her present catheterization does not show any fixed  obstructive coronary artery disease and  essentially reveals normal  coronary arteries.  This does raise the possibility of some transient  spasm in the past contributing to some of her symptomatology.  Based on  the above findings, she will continue with medical therapy, and she will  be given operative clearance for a planned lumbar back surgery to done  by Dr. Jule Ser.           ______________________________  Nicki Guadalajara, M.D.     TK/MEDQ  D:  12/03/2007  T:  12/03/2007  Job:  [id number]   cc:   Isabella Bowens, PA  Hewitt Shorts, M.D.

## 2011-04-21 ENCOUNTER — Encounter: Payer: Self-pay | Admitting: *Deleted

## 2011-04-22 NOTE — Op Note (Signed)
NAME:  Terri Rodriguez, Terri Rodriguez                 ACCOUNT NO.:  [id number]   MEDICAL RECORD NO.:  [id number]          PATIENT TYPE:  AMB   LOCATION:  DAY                          FACILITY:  [facility]   PHYSICIAN:  Ollen Gross, M.D.    DATE OF BIRTH:  1946/03/18   DATE OF PROCEDURE:  05/25/2005  DATE OF DISCHARGE:                                 OPERATIVE REPORT   PREOPERATIVE DIAGNOSIS:  Right knee lateral meniscal tear and chondral  defect.   POSTOPERATIVE DIAGNOSIS:  Right knee lateral meniscal tear and chondral  defect.   PROCEDURE:  Right knee arthroscopy with meniscal debridement and  chondroplasty.   SURGEON:  Ollen Gross, M.D.   ANESTHESIA:  General.   ESTIMATED BLOOD LOSS:  Minimal.   DRAINS:  None.   COMPLICATIONS:  None, stable to recovery.   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  She has significant lateral-sided pain and  mechanical symptoms.  MRI shows a meniscal tear.  She presents now for  arthroscopy and debridement.   PROCEDURE IN DETAIL:  After successful administration of general anesthetic  a tourniquet is placed high on her right thigh and right lower extremity  prepped and draped in the usual sterile fashion.  Standard superomedial and  inferolateral incisions are made, in-flow cannula passed superomedial and  camera passed inferolateral.  Arthroscopic visualization proceeds.  Undersurface of the patella shows some grade 1 chondromalacia, but no focal  chondral defects.  Similarly in the trochlea, there is mild chondromalacia,  but no defects.  The medial and lateral gutters are visualized; there is no  significant synovitis or loose bodies.  Flexion in valgus force is applied  to the knee and the medial compartment is entered.  A spinal needle is used  to localize the inferomedial portal, a small incision made and dilator  placed.  The medial side shows very low-grade chondromalacia, but no  evidence of any meniscal tear and no focal chondral defects.  The  intercondylar notch is visualized; the ACL appears normal.  There is a lot  of synovitis anterior to the ligaments.  The lateral compartment is entered  and she has a significant tear throughout the body and posterior horn of the  lateral meniscus.  She also has a grade 3 chondromalacia of the lateral  tibial plateau and an area of about 1 x 2 cm of grade 4 chondromalacia of  the lateral femoral condyle with unstable cartilage at the edges.  The  meniscus is debrided back to a stable base with baskets and 4.2-mm shaver.  We performed a chondroplasty on the surface of the lateral femoral condyle,  removing the unstable cartilage from the edges of the defect and then  debriding the center of the defect back to a bony base.  It is about 1.5 x 2  cm altogether.  The rest of the lateral compartment  looked fine.  The  Arthrocare TurboVac is then placed to seal  off the edges of the meniscus and  the rough edge of the cartilage.  We also debrided the synovium in the  suprapatellar area as well as anterior, adjacent to the fat pad.  The inflow  is decreased and minor bleeding points are identified and ablated with the  ArthroCare.  The arthroscopic equipment is then removed from the inferior  portals, which are closed with  interrupted 4-0 nylon.  Twenty milliliters of 0.25% Marcaine with  epinephrine are injected through an inflow cannula and then that is removed  and that portal closed with nylon.  A bulky sterile dressing is applied and  she is awakened and transported to Recovery in stable condition.       FA/MEDQ  D:  05/25/2005  T:  05/26/2005  Job:  [id number]

## 2011-04-22 NOTE — Procedures (Signed)
NAME:  Terri Rodriguez, Terri Rodriguez NO.:  [id number]   MEDICAL RECORD NO.:  [id number]          PATIENT TYPE:  OUT   LOCATION:  SLEEP CENTER                 FACILITY:  [facility]   PHYSICIAN:  Clinton D. Maple Hudson, MD, FCCP, FACPDATE OF BIRTH:  07/15/45   DATE OF STUDY:                            NOCTURNAL POLYSOMNOGRAM   INDICATION FOR STUDY:  Hypersomnia with sleep apnea.   EPWORTH SLEEPINESS SCORE:  5/24. BMI 19.9, weight 127 pounds.   MEDICATIONS:  Home medications are listed and reviewed. There is a past  history of diagnosed sleep apnea with poor toleration of CPAP.   SLEEP ARCHITECTURE:  Short total sleep time 265 minutes with sleep  efficiency 62%. Stage I was 2%, stage 2 95%, stages 3 and 4 3%. REM  absent. Sleep latency 40 minutes. Awake after sleep onset 87 minutes,  arousal index 9.5. Gaviscon was taken at 10 p.m. and again at 2 a.m.   RESPIRATORY DATA:  Split study protocol. Apnea/hypopnea index (AHI, RDI)  87 per hour indicating severe obstructive sleep apnea/hypopnea syndrome  before CPAP.  There were 186 obstructive apnea's and 20 hypopnea's before CPAP. Events  were not positional although somewhat more frequent while supine as  expected. She slept almost exclusively supine. CPAP was titrated to 12  CWP, AHI 0 per hour. A comfort lite Respironics mask was used with small  nasal pillows and heated humidifier. The patient reports claustrophobia  but apparently tolerated this mask.   OXYGEN DATA:  Moderate to loud snoring with oxygen desaturation to a  nadir of 82% before CPAP. After CPAP control saturation held 95% on room  air.   CARDIAC DATA:  Sinus rhythm with occasional PVC.   MOVEMENT-PARASOMNIA:  Occasional limb jerk, insignificant. The patient  slept on a wedge.   IMPRESSIONS-RECOMMENDATIONS:  1. Severe obstructive sleep apnea/hypopnea syndrome, AHI 87 per hour,      sleeping mostly supine elevated by a wedge. Most events were      recorded while in  this position. Moderate to loud snoring with      oxygen desaturation to a nadir of 82%.  2. Successful CPAP titration to 12 CWP, AHI 0 per hour. She accepted a      comfort lite Respironics mask with small nasal pillows and heated      humidifier but indicated discomfort with previous attempted CPAP      mask      designs because of claustrophobia.  3. Note that she took Gaviscon at 10 p.m. and 1:59 a.m.      Clinton D. Maple Hudson, MD, [facility], FACP  Diplomate, Biomedical engineer of Sleep Medicine  Electronically Signed     CDY/MEDQ  D:  12/31/2006 10:55:12  T:  12/31/2006 19:19:25  Job:  [id number]

## 2011-04-22 NOTE — Assessment & Plan Note (Signed)
[facility]                                 ON-CALL NOTE   Terri Rodriguez, Terri Rodriguez                          MRN:          [id number]  DATE:01/09/2008                            DOB:          10/19/1946    [name] from [facility] called from [phone] at 6:54 p.m. on  January 08, 2008, stating she was calling about this patient's CT scan,  but Dr. Fabian Sharp had already called her back by the time I called her.     Lelon Perla, DO  Electronically Signed    Shawnie Dapper  DD: 01/08/2008  DT: 01/09/2008  Job #: [id number]   cc:   Neta Mends. Fabian Sharp, MD

## 2011-08-05 ENCOUNTER — Telehealth: Payer: Self-pay | Admitting: *Deleted

## 2011-08-05 MED ORDER — CELECOXIB 200 MG PO CAPS: 200.0000 mg | ORAL_CAPSULE | Freq: Every day | ORAL | Status: DC

## 2011-08-05 NOTE — Telephone Encounter (Signed)
Pt is in [facility] and forgot her Celebrex. Pt needs this sent to the pharmacy. Left message on machine that rx was sent to pharmacy.

## 2011-08-10 ENCOUNTER — Telehealth: Payer: Self-pay | Admitting: *Deleted

## 2011-08-10 NOTE — Telephone Encounter (Signed)
Spoke to pt and nothing has change as far as health wise.  She is going to try to get her own blood for the procedure since she tends to run on the low side with her hemoglobin but she will talk to Dr. Lequita Halt about this.  Surgery is scheduled for 10/19/11 for a Bilateral TKA-Medical and lateral w/wo patella resurfacing

## 2011-08-10 NOTE — Telephone Encounter (Signed)
We can fill out form  Without  Visit .

## 2011-08-29 LAB — BASIC METABOLIC PANEL
BUN: 12
CO2: 30
Calcium: 9.3
Chloride: 104
Creatinine, Ser: 0.72
GFR calc Af Amer: >60
GFR calc non Af Amer: >60
Glucose, Bld: 97
Potassium: 4.4
Sodium: 143

## 2011-08-29 LAB — CROSSMATCH
ABO/RH(D): O NEG
Antibody Screen: NEGATIVE

## 2011-08-29 LAB — CBC
HCT: 33.3 — ABNORMAL LOW
Hemoglobin: 11.3 — ABNORMAL LOW
MCHC: 34
MCV: 87.6
Platelets: 253
RBC: 3.8 — ABNORMAL LOW
RDW: 14.4
WBC: 3.4 — ABNORMAL LOW

## 2011-08-29 LAB — TYPE AND SCREEN
ABO/RH(D): O NEG
Antibody Screen: NEGATIVE

## 2011-08-29 LAB — ABO/RH: ABO/RH(D): O NEG

## 2011-09-05 ENCOUNTER — Ambulatory Visit (INDEPENDENT_AMBULATORY_CARE_PROVIDER_SITE_OTHER): Payer: Medicare Other

## 2011-09-05 DIAGNOSIS — Z23 Encounter for immunization: Secondary | ICD-10-CM

## 2011-09-19 ENCOUNTER — Other Ambulatory Visit: Payer: Self-pay | Admitting: Orthopedic Surgery

## 2011-09-19 ENCOUNTER — Other Ambulatory Visit (HOSPITAL_COMMUNITY): Payer: Self-pay | Admitting: Orthopedic Surgery

## 2011-09-19 ENCOUNTER — Encounter (HOSPITAL_COMMUNITY): Payer: Medicare Other

## 2011-09-19 ENCOUNTER — Ambulatory Visit (HOSPITAL_COMMUNITY)
Admission: RE | Admit: 2011-09-19 | Discharge: 2011-09-19 | Disposition: A | Discharge status: Home / Self Care | Payer: Medicare Other | Source: Ambulatory Visit | Attending: Orthopedic Surgery | Admitting: Orthopedic Surgery

## 2011-09-19 DIAGNOSIS — Z01811 Encounter for preprocedural respiratory examination: Secondary | ICD-10-CM | POA: Insufficient documentation

## 2011-09-19 DIAGNOSIS — Z01812 Encounter for preprocedural laboratory examination: Secondary | ICD-10-CM | POA: Insufficient documentation

## 2011-09-19 DIAGNOSIS — Z01818 Encounter for other preprocedural examination: Secondary | ICD-10-CM

## 2011-09-19 DIAGNOSIS — M1710 Unilateral primary osteoarthritis, unspecified knee: Secondary | ICD-10-CM | POA: Insufficient documentation

## 2011-09-19 LAB — APTT: aPTT: 31 seconds (ref 24–37)

## 2011-09-19 LAB — CBC
HCT: 38.1 % (ref 36.0–46.0)
Hemoglobin: 12.9 g/dL (ref 12.0–15.0)
MCH: 32.3 pg (ref 26.0–34.0)
MCHC: 33.9 g/dL (ref 30.0–36.0)
MCV: 95.3 fL (ref 78.0–100.0)
Platelets: 216 10*3/uL (ref 150–400)
RBC: 4 MIL/uL (ref 3.87–5.11)
RDW: 13.9 % (ref 11.5–15.5)
WBC: 3.6 10*3/uL — ABNORMAL LOW (ref 4.0–10.5)

## 2011-09-19 LAB — URINALYSIS, ROUTINE W REFLEX MICROSCOPIC
Bilirubin Urine: NEGATIVE
Glucose, UA: NEGATIVE mg/dL
Hgb urine dipstick: NEGATIVE
Ketones, ur: NEGATIVE mg/dL
Leukocytes, UA: NEGATIVE
Nitrite: NEGATIVE
Protein, ur: NEGATIVE mg/dL
Specific Gravity, Urine: 1.008 (ref 1.005–1.030)
Urobilinogen, UA: 0.2 mg/dL (ref 0.0–1.0)
pH: 7 (ref 5.0–8.0)

## 2011-09-19 LAB — ABO/RH: ABO/RH(D): O NEG

## 2011-09-19 LAB — TYPE AND SCREEN
ABO/RH(D): O NEG
Antibody Screen: NEGATIVE

## 2011-09-19 LAB — COMPREHENSIVE METABOLIC PANEL
ALT: 33 U/L (ref 0–35)
AST: 40 U/L — ABNORMAL HIGH (ref 0–37)
Albumin: 4.1 g/dL (ref 3.5–5.2)
Alkaline Phosphatase: 97 U/L (ref 39–117)
BUN: 20 mg/dL (ref 6–23)
CO2: 29 mEq/L (ref 19–32)
Calcium: 9.6 mg/dL (ref 8.4–10.5)
Chloride: 103 mEq/L (ref 96–112)
Creatinine, Ser: 0.63 mg/dL (ref 0.50–1.10)
GFR calc Af Amer: >90 mL/min (ref >90)
GFR calc non Af Amer: >90 mL/min (ref >90)
Glucose, Bld: 99 mg/dL (ref 70–99)
Potassium: 4.5 mEq/L (ref 3.5–5.1)
Sodium: 139 mEq/L (ref 135–145)
Total Bilirubin: 0.4 mg/dL (ref 0.3–1.2)
Total Protein: 6.9 g/dL (ref 6.0–8.3)

## 2011-09-19 LAB — PROTIME-INR
INR: 1 (ref 0.00–1.49)
Prothrombin Time: 13.4 seconds (ref 11.6–15.2)

## 2011-09-19 LAB — SURGICAL PCR SCREEN
MRSA, PCR: NEGATIVE
Staphylococcus aureus: POSITIVE — AB

## 2011-09-21 ENCOUNTER — Inpatient Hospital Stay (HOSPITAL_COMMUNITY)
Admission: RE | Admit: 2011-09-21 | Discharge: 2011-09-27 | DRG: 462 | Disposition: A | Discharge status: Rehabilitation Facility | Payer: Medicare Other | Source: Ambulatory Visit | Attending: Orthopedic Surgery | Admitting: Orthopedic Surgery

## 2011-09-21 DIAGNOSIS — K219 Gastro-esophageal reflux disease without esophagitis: Secondary | ICD-10-CM | POA: Diagnosis present

## 2011-09-21 DIAGNOSIS — R000 Tachycardia, unspecified: Secondary | ICD-10-CM | POA: Diagnosis not present

## 2011-09-21 DIAGNOSIS — F29 Unspecified psychosis not due to a substance or known physiological condition: Secondary | ICD-10-CM | POA: Diagnosis not present

## 2011-09-21 DIAGNOSIS — Z79899 Other long term (current) drug therapy: Secondary | ICD-10-CM

## 2011-09-21 DIAGNOSIS — D62 Acute posthemorrhagic anemia: Secondary | ICD-10-CM | POA: Diagnosis not present

## 2011-09-21 DIAGNOSIS — T4275XA Adverse effect of unspecified antiepileptic and sedative-hypnotic drugs, initial encounter: Secondary | ICD-10-CM | POA: Diagnosis not present

## 2011-09-21 DIAGNOSIS — H918X9 Other specified hearing loss, unspecified ear: Secondary | ICD-10-CM | POA: Diagnosis present

## 2011-09-21 DIAGNOSIS — K589 Irritable bowel syndrome without diarrhea: Secondary | ICD-10-CM | POA: Diagnosis present

## 2011-09-21 DIAGNOSIS — M1710 Unilateral primary osteoarthritis, unspecified knee: Principal | ICD-10-CM | POA: Diagnosis present

## 2011-09-21 DIAGNOSIS — L719 Rosacea, unspecified: Secondary | ICD-10-CM | POA: Diagnosis present

## 2011-09-21 DIAGNOSIS — Z981 Arthrodesis status: Secondary | ICD-10-CM

## 2011-09-21 DIAGNOSIS — N390 Urinary tract infection, site not specified: Secondary | ICD-10-CM | POA: Diagnosis not present

## 2011-09-21 DIAGNOSIS — G609 Hereditary and idiopathic neuropathy, unspecified: Secondary | ICD-10-CM | POA: Diagnosis present

## 2011-09-21 DIAGNOSIS — Z79890 Hormone replacement therapy (postmenopausal): Secondary | ICD-10-CM

## 2011-09-21 DIAGNOSIS — Z7982 Long term (current) use of aspirin: Secondary | ICD-10-CM

## 2011-09-21 DIAGNOSIS — Z01812 Encounter for preprocedural laboratory examination: Secondary | ICD-10-CM

## 2011-09-21 DIAGNOSIS — G4730 Sleep apnea, unspecified: Secondary | ICD-10-CM | POA: Diagnosis present

## 2011-09-21 DIAGNOSIS — M47812 Spondylosis without myelopathy or radiculopathy, cervical region: Secondary | ICD-10-CM | POA: Diagnosis present

## 2011-09-21 DIAGNOSIS — J309 Allergic rhinitis, unspecified: Secondary | ICD-10-CM | POA: Diagnosis present

## 2011-09-21 DIAGNOSIS — I7300 Raynaud's syndrome without gangrene: Secondary | ICD-10-CM | POA: Diagnosis present

## 2011-09-21 DIAGNOSIS — H9319 Tinnitus, unspecified ear: Secondary | ICD-10-CM | POA: Diagnosis present

## 2011-09-21 DIAGNOSIS — Z860100 Personal history of colon polyps, unspecified: Secondary | ICD-10-CM

## 2011-09-21 HISTORY — PX: JOINT REPLACEMENT: SHX530

## 2011-09-22 LAB — CBC
HCT: 28.9 % — ABNORMAL LOW (ref 36.0–46.0)
Hemoglobin: 9.9 g/dL — ABNORMAL LOW (ref 12.0–15.0)
MCH: 33 pg (ref 26.0–34.0)
MCHC: 34.3 g/dL (ref 30.0–36.0)
MCV: 96.3 fL (ref 78.0–100.0)
Platelets: 203 10*3/uL (ref 150–400)
RBC: 3 MIL/uL — ABNORMAL LOW (ref 3.87–5.11)
RDW: 14.4 % (ref 11.5–15.5)
WBC: 9.1 10*3/uL (ref 4.0–10.5)

## 2011-09-22 LAB — BASIC METABOLIC PANEL
BUN: 21 mg/dL (ref 6–23)
CO2: 26 mEq/L (ref 19–32)
Calcium: 8.2 mg/dL — ABNORMAL LOW (ref 8.4–10.5)
Chloride: 103 mEq/L (ref 96–112)
Creatinine, Ser: 0.74 mg/dL (ref 0.50–1.10)
GFR calc Af Amer: >90 mL/min (ref >90)
GFR calc non Af Amer: 87 mL/min — ABNORMAL LOW (ref >90)
Glucose, Bld: 197 mg/dL — ABNORMAL HIGH (ref 70–99)
Potassium: 4.2 mEq/L (ref 3.5–5.1)
Sodium: 137 mEq/L (ref 135–145)

## 2011-09-23 LAB — BASIC METABOLIC PANEL
BUN: 18 mg/dL (ref 6–23)
CO2: 30 mEq/L (ref 19–32)
Calcium: 8.1 mg/dL — ABNORMAL LOW (ref 8.4–10.5)
Chloride: 101 mEq/L (ref 96–112)
Creatinine, Ser: 0.65 mg/dL (ref 0.50–1.10)
GFR calc Af Amer: >90 mL/min (ref >90)
GFR calc non Af Amer: >90 mL/min (ref >90)
Glucose, Bld: 149 mg/dL — ABNORMAL HIGH (ref 70–99)
Potassium: 3.9 mEq/L (ref 3.5–5.1)
Sodium: 136 mEq/L (ref 135–145)

## 2011-09-23 LAB — CBC
HCT: 22.6 % — ABNORMAL LOW (ref 36.0–46.0)
Hemoglobin: 7.7 g/dL — ABNORMAL LOW (ref 12.0–15.0)
MCH: 33.2 pg (ref 26.0–34.0)
MCHC: 34.5 g/dL (ref 30.0–36.0)
MCV: 96.2 fL (ref 78.0–100.0)
Platelets: 158 10*3/uL (ref 150–400)
RBC: 2.35 MIL/uL — ABNORMAL LOW (ref 3.87–5.11)
RDW: 14.5 % (ref 11.5–15.5)
WBC: 6 10*3/uL (ref 4.0–10.5)

## 2011-09-24 LAB — BASIC METABOLIC PANEL
BUN: 12 mg/dL (ref 6–23)
CO2: 32 mEq/L (ref 19–32)
Calcium: 8.5 mg/dL (ref 8.4–10.5)
Chloride: 98 mEq/L (ref 96–112)
Creatinine, Ser: 0.58 mg/dL (ref 0.50–1.10)
GFR calc Af Amer: >90 mL/min (ref >90)
GFR calc non Af Amer: >90 mL/min (ref >90)
Glucose, Bld: 130 mg/dL — ABNORMAL HIGH (ref 70–99)
Potassium: 3.6 mEq/L (ref 3.5–5.1)
Sodium: 135 mEq/L (ref 135–145)

## 2011-09-24 LAB — CROSSMATCH
ABO/RH(D): O NEG
Antibody Screen: NEGATIVE
Unit division: 0
Unit division: 0

## 2011-09-24 LAB — CBC
HCT: 26.5 % — ABNORMAL LOW (ref 36.0–46.0)
Hemoglobin: 9 g/dL — ABNORMAL LOW (ref 12.0–15.0)
MCH: 31.8 pg (ref 26.0–34.0)
MCHC: 34 g/dL (ref 30.0–36.0)
MCV: 93.6 fL (ref 78.0–100.0)
Platelets: 155 10*3/uL (ref 150–400)
RBC: 2.83 MIL/uL — ABNORMAL LOW (ref 3.87–5.11)
RDW: 16.2 % — ABNORMAL HIGH (ref 11.5–15.5)
WBC: 5.1 10*3/uL (ref 4.0–10.5)

## 2011-09-25 LAB — CBC
HCT: 24.4 % — ABNORMAL LOW (ref 36.0–46.0)
Hemoglobin: 8.4 g/dL — ABNORMAL LOW (ref 12.0–15.0)
MCH: 31.9 pg (ref 26.0–34.0)
MCHC: 34.4 g/dL (ref 30.0–36.0)
MCV: 92.8 fL (ref 78.0–100.0)
Platelets: 168 10*3/uL (ref 150–400)
RBC: 2.63 MIL/uL — ABNORMAL LOW (ref 3.87–5.11)
RDW: 15.3 % (ref 11.5–15.5)
WBC: 5.3 10*3/uL (ref 4.0–10.5)

## 2011-09-25 LAB — URINALYSIS, ROUTINE W REFLEX MICROSCOPIC
Bilirubin Urine: NEGATIVE
Glucose, UA: NEGATIVE mg/dL
Ketones, ur: NEGATIVE mg/dL
Nitrite: POSITIVE — AB
Protein, ur: 30 mg/dL — AB
Specific Gravity, Urine: 1.021 (ref 1.005–1.030)
Urobilinogen, UA: 1 mg/dL (ref 0.0–1.0)
pH: 6.5 (ref 5.0–8.0)

## 2011-09-25 LAB — URINE MICROSCOPIC-ADD ON

## 2011-09-26 DIAGNOSIS — Z96659 Presence of unspecified artificial knee joint: Secondary | ICD-10-CM

## 2011-09-26 DIAGNOSIS — M1710 Unilateral primary osteoarthritis, unspecified knee: Secondary | ICD-10-CM

## 2011-09-26 DIAGNOSIS — C33 Malignant neoplasm of trachea: Secondary | ICD-10-CM

## 2011-09-26 LAB — CBC
HCT: 23.5 % — ABNORMAL LOW (ref 36.0–46.0)
Hemoglobin: 8 g/dL — ABNORMAL LOW (ref 12.0–15.0)
MCH: 31.7 pg (ref 26.0–34.0)
MCHC: 34 g/dL (ref 30.0–36.0)
MCV: 93.3 fL (ref 78.0–100.0)
Platelets: 172 10*3/uL (ref 150–400)
RBC: 2.52 MIL/uL — ABNORMAL LOW (ref 3.87–5.11)
RDW: 14.8 % (ref 11.5–15.5)
WBC: 4.3 10*3/uL (ref 4.0–10.5)

## 2011-09-27 ENCOUNTER — Inpatient Hospital Stay (HOSPITAL_COMMUNITY)
Admission: AD | Admit: 2011-09-27 | Discharge: 2011-10-04 | DRG: 945 | Disposition: A | Discharge status: Home / Self Care | Payer: Medicare Other | Source: Ambulatory Visit | Attending: Physical Medicine & Rehabilitation | Admitting: Physical Medicine & Rehabilitation

## 2011-09-27 DIAGNOSIS — L2989 Other pruritus: Secondary | ICD-10-CM

## 2011-09-27 DIAGNOSIS — I10 Essential (primary) hypertension: Secondary | ICD-10-CM

## 2011-09-27 DIAGNOSIS — I7300 Raynaud's syndrome without gangrene: Secondary | ICD-10-CM

## 2011-09-27 DIAGNOSIS — G4733 Obstructive sleep apnea (adult) (pediatric): Secondary | ICD-10-CM

## 2011-09-27 DIAGNOSIS — E785 Hyperlipidemia, unspecified: Secondary | ICD-10-CM

## 2011-09-27 DIAGNOSIS — H9190 Unspecified hearing loss, unspecified ear: Secondary | ICD-10-CM

## 2011-09-27 DIAGNOSIS — Z833 Family history of diabetes mellitus: Secondary | ICD-10-CM

## 2011-09-27 DIAGNOSIS — Z860100 Personal history of colon polyps, unspecified: Secondary | ICD-10-CM

## 2011-09-27 DIAGNOSIS — D709 Neutropenia, unspecified: Secondary | ICD-10-CM

## 2011-09-27 DIAGNOSIS — L719 Rosacea, unspecified: Secondary | ICD-10-CM

## 2011-09-27 DIAGNOSIS — B009 Herpesviral infection, unspecified: Secondary | ICD-10-CM

## 2011-09-27 DIAGNOSIS — Z96659 Presence of unspecified artificial knee joint: Secondary | ICD-10-CM

## 2011-09-27 DIAGNOSIS — L298 Other pruritus: Secondary | ICD-10-CM

## 2011-09-27 DIAGNOSIS — Z5189 Encounter for other specified aftercare: Principal | ICD-10-CM

## 2011-09-27 DIAGNOSIS — R21 Rash and other nonspecific skin eruption: Secondary | ICD-10-CM

## 2011-09-27 DIAGNOSIS — K589 Irritable bowel syndrome without diarrhea: Secondary | ICD-10-CM

## 2011-09-27 DIAGNOSIS — N390 Urinary tract infection, site not specified: Secondary | ICD-10-CM

## 2011-09-27 DIAGNOSIS — Z79899 Other long term (current) drug therapy: Secondary | ICD-10-CM

## 2011-09-27 DIAGNOSIS — M1710 Unilateral primary osteoarthritis, unspecified knee: Secondary | ICD-10-CM

## 2011-09-27 DIAGNOSIS — K5900 Constipation, unspecified: Secondary | ICD-10-CM

## 2011-09-27 DIAGNOSIS — E669 Obesity, unspecified: Secondary | ICD-10-CM

## 2011-09-27 DIAGNOSIS — H9319 Tinnitus, unspecified ear: Secondary | ICD-10-CM

## 2011-09-27 DIAGNOSIS — D62 Acute posthemorrhagic anemia: Secondary | ICD-10-CM

## 2011-09-28 DIAGNOSIS — Z96659 Presence of unspecified artificial knee joint: Secondary | ICD-10-CM

## 2011-09-28 DIAGNOSIS — M1710 Unilateral primary osteoarthritis, unspecified knee: Secondary | ICD-10-CM

## 2011-09-28 LAB — DIFFERENTIAL
Basophils Absolute: 0 10*3/uL (ref 0.0–0.1)
Basophils Relative: 0 % (ref 0–1)
Eosinophils Absolute: 0.2 10*3/uL (ref 0.0–0.7)
Eosinophils Relative: 4 % (ref 0–5)
Lymphocytes Relative: 18 % (ref 12–46)
Lymphs Abs: 1.1 10*3/uL (ref 0.7–4.0)
Monocytes Absolute: 0.8 10*3/uL (ref 0.1–1.0)
Monocytes Relative: 14 % — ABNORMAL HIGH (ref 3–12)
Neutro Abs: 3.9 10*3/uL (ref 1.7–7.7)
Neutrophils Relative %: 64 % (ref 43–77)

## 2011-09-28 LAB — CBC
HCT: 26 % — ABNORMAL LOW (ref 36.0–46.0)
Hemoglobin: 8.7 g/dL — ABNORMAL LOW (ref 12.0–15.0)
MCH: 31.5 pg (ref 26.0–34.0)
MCHC: 33.5 g/dL (ref 30.0–36.0)
MCV: 94.2 fL (ref 78.0–100.0)
Platelets: 239 10*3/uL (ref 150–400)
RBC: 2.76 MIL/uL — ABNORMAL LOW (ref 3.87–5.11)
RDW: 14.8 % (ref 11.5–15.5)
WBC: 6.1 10*3/uL (ref 4.0–10.5)

## 2011-09-28 LAB — COMPREHENSIVE METABOLIC PANEL
ALT: 29 U/L (ref 0–35)
AST: 36 U/L (ref 0–37)
Albumin: 2.8 g/dL — ABNORMAL LOW (ref 3.5–5.2)
Alkaline Phosphatase: 86 U/L (ref 39–117)
BUN: 11 mg/dL (ref 6–23)
CO2: 30 mEq/L (ref 19–32)
Calcium: 8.9 mg/dL (ref 8.4–10.5)
Chloride: 101 mEq/L (ref 96–112)
Creatinine, Ser: 0.58 mg/dL (ref 0.50–1.10)
GFR calc Af Amer: >90 mL/min (ref >90)
GFR calc non Af Amer: >90 mL/min (ref >90)
Glucose, Bld: 120 mg/dL — ABNORMAL HIGH (ref 70–99)
Potassium: 3.9 mEq/L (ref 3.5–5.1)
Sodium: 137 mEq/L (ref 135–145)
Total Bilirubin: 0.7 mg/dL (ref 0.3–1.2)
Total Protein: 5.8 g/dL — ABNORMAL LOW (ref 6.0–8.3)

## 2011-09-28 NOTE — H&P (Addendum)
NAME:  Terri Rodriguez, Terri Rodriguez NO.:  [id number]  MEDICAL RECORD NO.:  [id number]  LOCATION:                               FACILITY:  [facility]  PHYSICIAN:  Ollen Gross, M.D.    DATE OF BIRTH:  May 26, 1946  DATE OF ADMISSION:  09/21/2011 DATE OF DISCHARGE:                             HISTORY & PHYSICAL   CHIEF COMPLAINT:  Bilateral knee pain.  HISTORY OF ILLNESS:  Patient is a 65 year old female who has been seen by Dr. Lequita Halt for ongoing bilateral knee pain.  Her knee has been progressively worse for quite some time now.  She has been seen and treated by Dr. Lequita Halt.  X-rays at this point show advanced end-stage arthritis, bone-on-bone in both knees.  The right knee appears to be a little more pronounced than the left, but both knees have bone-on-bone changes.  She has had a point now where she would like to have the surgery done.  She has requested and would like to have both knees done at the same time.  Pros and cons of doing one versus both have been discussed with her.  Risks and benefits have been discussed.  She elected to proceed with bilateral knee replacement arthroplasties.  She has been seen preoperatively by Dr. Fabian Sharp and felt to be stable for surgery, although it is noted that she does have sleep apnea, and she has also been seen preoperatively by Dr. Nicholaus Bloom and felt to be stable for upcoming surgery.  There are no contraindications for the upcoming procedure such as  ongoing infection or progressive neurological disease.  ALLERGIES:  No known drug allergies.  INTOLERANCES:  CODEINE causes nausea and vomiting.  CURRENT MEDICATIONS: 1. Altace 10 mg daily. 2. Norvasc 5 mg daily. 3. Lipitor 20 mg daily. 4. Coreg 3.125 mg twice a day. 5. Aspirin 81 mg daily. 6. Nitro-Tab 0.4 mg sublingual p.r.n. 7. Zegerid 40/1100 mg, 1 cap daily. 8. Gaviscon tabs 1 to 2 p.r.n. 9. Citrucel papulosis 2-4 tablets every day. 10.Vivelle-Dot, estradiol transdermal  system, changed every 4 days. 11.Centrum Silver vitamin daily. 12.Caltrate plus D daily. 13.Vitamin D3 2000 international units daily. 14.Rhinocort nasal spray 1 to 2 sprays q.h.s. p.r.n. 15.Claritin 10 mg daily p.r.n. 16.MetroGel topical gel 1% to face b.i.d. 17.Finacea topical gel 15% to face b.i.d. 18.Cymbalta 60 mg for nerve pain daily. 19.Celebrex 200 mg daily p.r.n. pain. 20.Tramadol 50 mg 1 to 2 tabs every 4 to 6 hours as needed for pain. 21.Tylenol 500 mg 2 tabs every 6 hours needed for pain.  PAST MEDICAL HISTORY: 1. Impaired hearing. 2. Tinnitus with high-frequency hearing loss. 3. Sleep apnea, uses CPAP. 4. Gastroesophageal reflux disease. 5. Raynaud's phenomenon. 6. Irritable bowel syndrome. 7. Acne rosacea. 8. Allergic rhinitis. 9. History of pigmented purpuric dermatosis. 10.History of subacute thyroiditis in February 2001, resolved. 11.History of adenomatous colonic polyps. 12.Cervical arthritis. 13.Stress fracture 3rd metatarsal left foot, May 2009.  PAST SURGICAL HISTORY: 1. Tonsils and adenoids in 1952. 2. Cesarean section in 1982. 3. D and E in 1988 and again in 1990. 4. Transabdominal hysterectomy with BSO in 1993. 5. Nasal septoplasty and turbinate reduction in June 2001. 6. Dental implants  in December 2003. 7. Right knee arthroscopy in June 2006. 8. L3-4, L4-5 laminectomy, diskectomy, and posterior lateral interbody     fusion, March 2009. 9. Left knee arthroscopy in September 2011.  SOCIAL HISTORY:  Married.  Retired Field seismologist. Nonsmoker.  Two to three glasses of wine with dinner daily.  She has 1 adult daughter.  FAMILY HISTORY:  Father deceased at age 83 with multiple DVTs and PEs. Mother deceased at age 41 with rheumatoid arthritis, diabetes, MI, congestive heart failure, gout, ITP, gastrointestinal bleeds, degenerative joint disease, and cauda equina syndrome.  She has 2 brothers, one 26 years old with obstructive sleep  apnea, another 66 year old with obstructive sleep apnea.  REVIEW OF SYSTEMS:  GENERAL:  No fevers, chills, or night sweats. NEURO:  She does have the ringing in her ears, tinnitus.  She has some high-frequency hearing loss.  No seizures, syncope, or paralysis. RESPIRATORY:  No shortness of breath, productive cough, or hemoptysis. CARDIOVASCULAR:  No chest pain or orthopnea.  GI:  No nausea, vomiting, diarrhea, or constipation.  GU:  No dysuria, hematuria, or discharge. MUSCULOSKELETAL:  Knee pain.  PHYSICAL EXAMINATION:  VITAL SIGNS:  Pulse around 76, respirations 12, blood pressure 128/68. GENERAL:  65 year old white female, well-nourished, well-developed, in no acute distress.  She is alert, oriented, cooperative, pleasant, excellent historian. HEENT:  Normocephalic, atraumatic.  Pupils are round and reactive.  EOMs intact.  NECK:  Supple.  No carotid bruits are appreciated. CHEST:  Clear. HEART:  Regular rate and rhythm with a very faint early systolic ejection murmur.  ABDOMEN:  Soft and round.  Bowel sounds present. Rectal/breasts/genitalia:  Not done, not part of present illness. EXTREMITIES:  Right knee, range of motion 5 to 120, marked crepitus, slight valgus.  No instability.  Left knee, no effusion, slight valgus, range of motion 10 to 120.  Moderate crepitus.  IMPRESSION:  Osteoarthritis, bilateral knees.  PLAN:  Patient will be admitted to [facility] to undergo bilateral total knee replacement arthroplasties.  Surgery will be performed by Ollen Gross.  She does want to look into a skilled rehab following her hospital stay.     Alexzandrew L. Julien Girt, P.A.C.   ______________________________ Ollen Gross, M.D.    ALP/MEDQ  D:  09/20/2011  T:  09/21/2011  Job:  [id number]  cc:   Nicki Guadalajara, M.D. Fax: [phone]  Neta Mends. Fabian Sharp, MD [address] [place], [place] [address]  Electronically Signed by Patrica Duel P.A.C. on 09/24/2011  09:20:46 AM Electronically Signed by Ollen Gross M.D. on 09/28/2011 11:30:25 AM Electronically Signed by Ollen Gross M.D. on 09/28/2011 11:33:43 AM

## 2011-09-28 NOTE — Op Note (Addendum)
NAME:  DONNIE, GEDEON NO.:  [id number]  MEDICAL RECORD NO.:  [id number]  LOCATION:  1232                         FACILITY:  [facility]  PHYSICIAN:  Ollen Gross, M.D.    DATE OF BIRTH:  04-14-1946  DATE OF PROCEDURE:  09/21/2011 DATE OF DISCHARGE:                              OPERATIVE REPORT   PREOPERATIVE DIAGNOSIS:  Osteoarthritis, bilateral knees.  POSTOPERATIVE DIAGNOSIS:  Osteoarthritis, bilateral knees.  PROCEDURE:  Bilateral total knee arthroplasty.  SURGEON:  Ollen Gross, M.D.  ASSISTANT:  Alexzandrew L. Perkins, P.A.C.  ANESTHESIA:  General.  ESTIMATED BLOOD LOSS:  Minimal.  DRAINS:  Autovac x1, each side.  TOURNIQUET TIME:  Right 35 minutes at 300 mmHg.  Left 37 minutes at 300 mmHg.  COMPLICATIONS:  None.  CONDITION:  Stable to Recovery.  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  She has had extensive nonoperative management including prior arthroscopies as well as injections of cortisone and viscosupplements without much benefit. She has had significant functional limitations and developed instability in the knees.  She presents now for a bilateral total knee arthroplasty.  PROCEDURE IN DETAIL:  After successful administration of general anesthetic, a tourniquet was placed high on both thighs and both lower extremities were prepped and draped in usual sterile fashion.  The right one was more symptomatic, than first.  The right lower extremity was wrapped in Esmarch, knee flexed, tourniquet inflated to 300 mmHg. Midline incision was made with a 10 blade through subcutaneous tissue to the level of the extensor mechanism.  Fresh blade was used make a medial parapatellar arthrotomy.  Soft tissue on the proximal and medial tibia was subperiosteally elevated to the joint line with the knife and  into the semimembranosus bursa with a Cobb elevator.  Soft tissue laterally was elevated with attention being paid to avoiding the patellar tendon on tibial tubercle.  The patella was everted, knee flexed to 90 degrees, and ACL and PCL removed.  Drill was used to create a starting hole in the distal femur and the canal was thoroughly irrigated.  The 5-degree right valgus alignment guide was placed and distal femoral cutting block pinned to remove a 11 mm off the distal femur.  Distal femoral resection was made with an oscillating saw.  The tibia was subluxed forward and the menisci removed.  Extramedullary tibial alignment guide was placed __________ proximally at the medial aspect of the tibial tubercle and distally along the second metatarsal axis and tibial crest.  The block was pinned to remove 2 mm off the more deficient lateral side.  Tibial resection was made with an oscillating saw.  Size 3 was the most appropriate tibial component and the proximal tibia was prepared to modular drill and keel punch for the size 3.  Femoral sizing guide was placed, size 3 was most appropriate on the femur.  Rotation was marked off the epicondylar axis and confirmed by creating rectangular flexion gap at 90 degrees.  The block was pinned in that rotation and the anterior, posterior, and chamfer cuts were made. Intercondylar block  was placed and that cut was made.  Trial size 3 posterior stabilized femur was placed.  A 10 mm posterior stabilized rotating platform insert trial was placed with the 10 full extensions achieved with excellent varus-valgus and anterior-posterior balance throughout full range of motion.  The patella was everted and thickness measured to be 22 mm.  Freehand resection was taken to 12 mm, 35 template was placed, lug holes were drilled, trial patella was placed and it tracks normally.  Osteophytes removed off the posterior femur with the trial in place.  All trials were  removed and the cut bone surfaces prepared with pulsatile lavage.  Cement was mixed and once ready for implantation, the size 3 mobile bearing tibial tray, size 3 posterior stabilized femur, and 35 patella were cemented into place and the patella was held with a clamp.  A trial 10 mm insert was placed, knee held in full extension, all extruded cement removed.  Cement fully hardened, then the permanent 10 mm posterior stabilized rotating platform insert was placed into the tibial tray.  The wound was copiously irrigated with saline solution and the arthrotomy closed over the Autovac drain with interrupted #1 PDS.  Flexion against gravity was 135 degrees and patella tracks normally.  Tourniquet was released for initial tourniquet time of 35 minutes.  Subcu was closed with interrupted 2-0 Vicryl and subcuticular running 4-0 Monocryl.  Moist sponge was placed on the incision and the Esmarch loosely wrapped around the knee.  We then addressed the left knee.  Left lower extremity was wrapped in Esmarch, knee flexed, tourniquet inflated to 300 mmHg.  Midline incision was made with a 10 blade through subcutaneous tissue to the extensor mechanism.  Fresh blade was used to make a medial parapatellar arthrotomy.  Soft tissue releases were performed and the patella everted.  Knee was flexed to 90 degrees, and ACL and PCL were removed. Drill was used to create a starting hole in the distal femur and the canal was thoroughly irrigated.  The 5-degree left valgus alignment guide was placed.  The distal femoral cutting block pinned to remove a 11 mm off the distal femur.  Resection was made with an oscillating saw.  The tibia subluxed forward and menisci were removed.  Extramedullary tibial alignment guide was placed referencing proximally to the medial aspect of tibial tubercle and distally along the second metatarsal axis and tibial crest.  The block was pinned to remove 2 mm off the more deficient  medial side.  Tibial resection was made with an oscillating saw.  Size 3 was the most appropriate tibial component and the proximal tibia was prepared to modular drill and keel punch for the size 3.  Femoral sizing guide was placed, size 3 was most appropriate on the femur.  Rotation was marked at the epicondylar axis and confirmed by creating rectangular flexion gap at 90 degrees.  The block was pinned in that rotation and the anterior, posterior, and chamfer cuts were made. Intercondylar block was placed and that cut was made.  Trial size 3 posterior stabilized femur was placed.  A 10 mm posterior stabilized rotating platform insert trial was placed.  With the 10 full extensions achieved with excellent varus-valgus and anterior-posterior balance throughout full range of motion.  The patella was everted, thickness measured to be 22 mm.  Freehand resection was taken to 12 mm, 35 template was placed, lug holes were drilled, trial patella was placed and it tracks normally.  Osteophytes removed off the posterior femur  with the trial in place.  All trials were removed and the cut bone surfaces were prepared with pulsatile lavage.  Cement was mixed and once ready for implantation, __________ size 3, mobile bearing tibial tray, size 3 posterior stabilized femur, and 35 patella were cemented into place and patella was held with a clamp.  Trial 10 mm inserts placed, knee held in full extension, all extruded cement removed.  Cement fully hardened, then the permanent 10 mm posterior stabilized rotating platform insert was placed in the tibial tray.  The wound was copiously irrigated with saline solution the arthrotomy closed over an Autovac drain with interrupted #1 PDS.  Flexion against gravity was about 140 degrees.  Tourniquet was then released with total time of 37 minutes. Subcu was closed with interrupted 2-0 Vicryl and subcuticular running 4- 0 Monocryl.  Incisions were cleaned and dried,  and Steri-Strips and bulky sterile dressing were applied.  On the right, we then placed a catheter for the Marcaine pain pump and the pump was initiated.  We then placed a bulky sterile dressing.  She was then placed into knee immobilizers, awakened, and transported to recovery in stable condition.  Please note that the findings were on the right knee, bone-on-bone changes in the lateral compartment and patellofemoral compartments.  On the left knee, bone-on-bone changes in the medial and patellofemoral compartments.  Also note that, it was a medical necessity for a surgical assistant to participate in this operation in order to do in a safe and expeditious manner.  Surgical assistant was necessary for retraction of ligaments in vital neurovascular structures as well as for proper placement of the leg in order to allow for anatomic positioning of the prosthesis.     Ollen Gross, M.D.     FA/MEDQ  D:  09/21/2011  T:  09/21/2011  Job:  [id number]  Electronically Signed by Ollen Gross M.D. on 09/28/2011 11:30:30 AM Electronically Signed by Ollen Gross M.D. on 09/28/2011 11:33:37 AM

## 2011-09-28 NOTE — Discharge Summary (Addendum)
NAME:  Terri Rodriguez, DEMEYER NO.:  [id number]  MEDICAL RECORD NO.:  [id number]  LOCATION:  1620                         FACILITY:  [facility]  PHYSICIAN:  Ollen Gross, M.D.    DATE OF BIRTH:  1946-02-13  DATE OF ADMISSION:  09/21/2011 DATE OF DISCHARGE:  09/26/2011                        DISCHARGE SUMMARY - REFERRING   ADMITTING DIAGNOSES: 1. Osteoarthritis, bilateral knees. 2. Impaired hearing. 3. Tinnitus with high-frequency hearing loss. 4. Sleep apnea, uses continuous positive airway pressure. 5. Gastroesophageal reflux disease. 6. Raynaud's phenomenon. 7. Irritable bowel syndrome. 8. Acne rosacea. 9. Allergic rhinitis. 10.History of pigmented purpuric dermatosis. 11.History of subacute thyroiditis in February 2001, resolved. 12.History of adenomatous colonic polyps. 13.Cervical arthritis. 14.Stress fracture, third metatarsal, left foot in May 2009.  DISCHARGE DIAGNOSES: 1. Osteoarthritis, bilateral knees, status post bilateral total knee     replacement arthroplasty. 2. Postoperative acute blood loss anemia. 3. Status post transfusion without sequelae. 4. Postoperative urinary tract infection. 5. Postop oversedation, resolved. 6. Impaired hearing. 7. Tinnitus with high-frequency hearing loss. 8. Sleep apnea, uses continuous positive airway pressure. 9. Gastroesophageal reflux disease. 10.Raynaud's phenomenon. 11.Irritable bowel syndrome. 12.Acne rosacea. 13.Allergic rhinitis. 14.History of pigmented purpuric dermatosis. 15.History of subacute thyroiditis in February 2001, resolved. 16.History of adenomatous colonic polyps. 17.Cervical arthritis. 18.Stress fracture, third metatarsal, left foot in May 2009.  PROCEDURE:  September 21, 2011, bilateral total knee arthroplasty; surgeon, Dr. Lequita Halt; assistant, Avel Peace PAC, minimal blood loss; tourniquet times, right knee 35 minutes, left knee 37 minutes.  CONSULTS:  None.  BRIEF HISTORY:  The  patient is a 65 year old female, well-known to Dr. Ollen Gross with end-stage arthritis of both knees, right is more advanced radiographically and symptomatically more so than the left. She has had extensive nonoperative management including prior arthroscopies as well as injections of cortisone and viscous supplementation without benefit.  Significant functional limitations and now presents for bilateral total knee replacement arthroplasties.  LABORATORY DATA:  CBC on admission:  Hemoglobin 12.9, hematocrit 38.1, white cell count 3.6, platelets 216.  PT/INR preop 13.4 and 1 with a PTT of 31.  Chem-panel on admission:  Minimally elevated AST of 40. Remaining chem-panel within normal limits.  Preop UA was negative. Blood group type O negative.  Nasal swabs were positive for Staph aureus, but negative for MRSA.  Serial CBCs were followed.  Hemoglobin dropped down to 9.9 on day 1, then down to 7.7 at which point she was given 2 units of blood, back up to 9, drifted back down.  Last H and H was 8.0 and 23.5. Serial BMETs were followed for 3 days.  Electrolytes remained within normal limits.  Glucose did go up from 99 to 197, back down to 130.  Had a followup UA on October 21, which showed small blood, positive nitrite, large leukocyte esterase, many epithelials, numerous white cells, and many bacteria.  X-rays:  Two-view, chest taken on September 19, 2011 preop; no evidence of acute cardiopulmonary disease.  HOSPITAL COURSE:  The patient admitted to [facility], taken to OR, underwent above-stated procedure without complication.  The patient tolerated the procedure well, later transferred to recovery room at which point she was very slow to  wake up following the anesthesia.  She had to be placed on her CPAP and had some oversedation from her medications.  She was later transferred to the step-down unit just for monitoring and she was supplemented by CPAP and oxygen.  She  actually did pretty well and the next morning on day 1, she looked great, she was doing well.  Despite not having the epidural placed, oversedation had resolved, she was back on her home medications.  She was already able to do straight leg raises on day 1 from both.  Her postop sedation had resolved.  She looked good on morning of day 1, so she did have the epidural.  We began the Xarelto for DVT prophylaxis.  We had PCA for IV pain medication, also supplemented by p.o. medications, encouraged her to start with the p.o. pills.  She was transferred up to the floor.  She wanted to look into a skilled rehab, so we got social worker involved right away.  She actually got up and walked about 5 feet on day 1 and slowly progressed.  By day 2, she was doing well, she was able to get a little bit of sleep, she was still using the PCA, so we left it for another day.  We changed both dressings and both incisions looked good. She did have a pain pump placed in the right leg and that was removed on day 2.  The right leg actually had a little bit better pain control versus the left leg, which we did not have a pain pump in and the hemoglobin drifted down to 7.7 at which point we recommended blood.  Her postop sedation had resolved.  She had a little bit of tachycardia on first day or two, which was combination, I think from pain issues and also from her acute blood loss, so we followed that.  She did get 2 units of blood, her pulse was about 115-120.  On day 2, that improved and it was back down to 103.  By day 3 after blood, she was back to a hemoglobin of 9.  By day 3, we discontinued the PCA later that day.  She wanted to look into rehab.  We are going to keep her through the weekend on Saturday and Sunday.  She continued to receive therapy and we had increased her oxycodone initially for the severe pain, but we decreased it over the weekends on Sunday, and she was still doing well with the pain  control.  She was up walking about 50-60 feet by then.  She was seen back in rounds on Monday, postop day 5, doing well.  We are waiting on final bed offers and decided the patient to be transferred over at that time.  DISCHARGE PLAN:  The patient be transferred over to skilled nursing facility of choice.  DISCHARGE DIAGNOSES:  Please see above.  DISCHARGE MEDICATIONS:  Current medications at time of transfer include; 1. Xarelto 10 mg p.o. daily for 3 additional weeks and discontinue the     Xarelto. 2. Colace 100 mg p.o. b.i.d. 3. Altace 10 mg p.o. daily. 4. Cymbalta 60 mg p.o. q.h.s. 5. Claritin 10 mg p.o. daily. 6. Coreg 3.125 mg p.o. b.i.d. 7. Norvasc 5 mg p.o. every evening. 8. Lipitor 20 mg p.o. at bedtime. 9. Metronidazole gel topical daily to face. 10.Nu-Iron 150 mg p.o. b.i.d. for 3 weeks and discontinue the Nu-Iron. 11.Finacea gel, apply to face daily; patient may use her home supply. 12.Vaniqa cream,  apply to face daily; patient may use her home supply. 13.Cipro 500 mg p.o. b.i.d. for 3 days, then discontinue the Cipro. 14.Zegerid 1100 mg/40 every morning; do not substitute, please use     home supply if needed. 15.Tylenol 325 one or two every 4-6 hours as needed for mild pain,     temperature, or headache. 16.Laxative of choice. 17.Enema of choice. 18.Robaxin 500 mg p.o. q.6-8 hours p.r.n. spasm. 19.Ultram 50 mg one or two tabs every 6 hours as needed for mild to     moderate pain. 20.OxyIR 5 mg one to two tabs every 4-6 hours as needed for moderate     to severe pain. 21.Sublingual nitro 0.4 mg every 5 minutes p.r.n. chest pain.  DIET:  Heart-healthy diet.  ACTIVITY:  She is weightbearing as tolerated, total knee protocol, and she has bilateral total knees.  Please allow her to use CPM 2-hour sessions, do not exceed 2 hours on each leg, alternate legs, do not use at the same time.  Continue with her total knee protocol with gait training, ambulation, ADLs,  range of motion, strengthening exercises per PT and OT.  FOLLOW UP:  She needs to follow up with Dr. Lequita Halt in the office next week, either on October 30 or November 1.  Please contact the office at [phone] to help arrange appointment for followup care of this patient.  DISPOSITION:  Awaiting final bed offers.  CONDITION UPON DISCHARGE:  Improving at time of dictation.     Alexzandrew L. Julien Girt, P.A.C.   ______________________________ Ollen Gross, M.D.    ALP/MEDQ  D:  09/26/2011  T:  09/26/2011  Job:  [id number]  cc:   Neta Mends. Fabian Sharp, MD [address] [place], [place] [address]  Skilled Nursing Facility  Nicki Guadalajara, M.D. Fax: [phone]  Electronically Signed by Patrica Duel P.A.C. on 09/26/2011 04:24:41 PM Electronically Signed by Ollen Gross M.D. on 09/28/2011 11:30:33 AM Electronically Signed by Ollen Gross M.D. on 09/28/2011 11:34:06 AM

## 2011-09-29 LAB — URINE CULTURE: Culture  Setup Time: 201210240857

## 2011-09-30 DIAGNOSIS — M1710 Unilateral primary osteoarthritis, unspecified knee: Secondary | ICD-10-CM

## 2011-09-30 DIAGNOSIS — Z96659 Presence of unspecified artificial knee joint: Secondary | ICD-10-CM

## 2011-09-30 NOTE — Discharge Summary (Signed)
  NAME:  KIMONI, PICKERILL NO.:  [id number]  MEDICAL RECORD NO.:  [id number]  LOCATION:  1620                         FACILITY:  [facility]  PHYSICIAN:  Ollen Gross, M.D.    DATE OF BIRTH:  1946-04-10  DATE OF ADMISSION:  09/21/2011 DATE OF DISCHARGE:  09/27/2011                        DISCHARGE SUMMARY - REFERRING   ADDENDUM:  ADMITTING AND DISCHARGE AND PROCEDURE:  Please see previously dictated summary.  BRIEF HISTORY AND LABS:  Please see previously dictated summary.  HOSPITAL COURSE:  Patient was originally set up to go to a skilled facility versus rehab on September 26, 2011, however, we are waiting on insurance approval and it did not come through. Therefore, she remained in the hospital another day due to insurance reasons.  We are anticipating approval today on September 27, 2011 and once it is approved, she will possibly go to rehab, possibly skilled nursing facility, waiting on insurance approval and final bed offer.  DISCHARGE MEDICATIONS AND PLAN:  No change, please see previously dictated summary.  CONDITION UPON DISCHARGE:  Improving.     Alexzandrew L. Julien Girt, P.A.C.   ______________________________ Ollen Gross, M.D.    ALP/MEDQ  D:  09/27/2011  T:  09/27/2011  Job:  [id number]  Electronically Signed by Patrica Duel P.A.C. on 09/29/2011 10:50:50 AM Electronically Signed by Ollen Gross M.D. on 09/30/2011 02:54:45 PM

## 2011-10-01 DIAGNOSIS — M1710 Unilateral primary osteoarthritis, unspecified knee: Secondary | ICD-10-CM

## 2011-10-01 DIAGNOSIS — Z96659 Presence of unspecified artificial knee joint: Secondary | ICD-10-CM

## 2011-10-03 DIAGNOSIS — M1710 Unilateral primary osteoarthritis, unspecified knee: Secondary | ICD-10-CM

## 2011-10-03 DIAGNOSIS — Z96659 Presence of unspecified artificial knee joint: Secondary | ICD-10-CM

## 2011-10-04 DIAGNOSIS — M1710 Unilateral primary osteoarthritis, unspecified knee: Secondary | ICD-10-CM

## 2011-10-04 DIAGNOSIS — Z96659 Presence of unspecified artificial knee joint: Secondary | ICD-10-CM

## 2011-10-04 NOTE — H&P (Signed)
NAME:  Terri Rodriguez, Terri Rodriguez NO.:  [id number]  MEDICAL RECORD NO.:  [id number]  LOCATION:  4038                         FACILITY:  [facility]  PHYSICIAN:  Erick Colace, M.D.DATE OF BIRTH:  1946/02/17  DATE OF ADMISSION:  09/27/2011 DATE OF DISCHARGE:                             HISTORY & PHYSICAL   REASON FOR ADMISSION:  Bilateral total knee replacement, postoperative day #6.  A 65 year old female with prior history of end-stage OA as well as Raynaud phenomenon.  She had bilateral knee DJD, which failed conservative caring including Viscosupplementation, corticosteroid injections, and therapy.  She then elected to undergo bilateral TKR on September 21, 2011.  Postoperatively, she was placed on a Xarelto for DVT prophylaxis, did receive 2 units of packed red blood cells per ABLA with a hemoglobin dropped to 7.7.  Also has had issues with sedation and confusion secondary to her PCA as well as a UTI.  She was treated with Cipro for 3 days for her UTI and her PCA was discontinued.  REVIEW OF SYSTEMS:  Mild constipation, occasional pain under musculoskeletal, otherwise negative.  She also has rash on her back and itching.  PAST MEDICAL HISTORY: 1. Hypertension. 2. Tinnitus with hearing loss. 3. IBS. 4. Raynaud. 5. Acne rosacea. 6. Obstructive sleep apnea. 7. History of subacute thyroiditis.  PAST SURGICAL HISTORY:  TAH-BSO, left third metatarsal fracture, nasal septoplasty, a colonic polyp removal, decompressive laminectomy in 2009.  FAMILY HISTORY:  Diabetes.  SOCIAL HISTORY:  Married.  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  Tobacco negative.  EtOH; 2 to 3 glasses of wine per day.  FUNCTIONAL HISTORY:  Independent in driving prior to admission.  HOME MEDICATIONS: 1. Vivelle 0.375 mg daily. 2. Claritin 10 mg daily. 3. Altace 10 mg p.o. daily. 4. Gaviscon one p.o. daily p.r.n. 5. MetroGel one application daily. 6. Cymbalta 60 mg p.o.  daily. 7. Coreg 3.125 mg p.o. b.i.d. 8. Norvasc 5 mg p.o. daily. 9. Lipitor 20 mg p.o. at bedtime. 10.Nitroglycerin p.r.n. 11.Ultram 50 mg q.4 h. p.r.n. 12.Aspirin 81 mg p.o. daily. 13.Zegerid 110/40 mg a day.  Last hemoglobin 8.0 on September 26, 2011, white count 4.3, platelets 172,000.  PHYSICAL EXAMINATION:  VITAL SIGNS:  Blood pressure 115/68, pulse 90, respiratory rate 16, temperature 98.9. GENERAL:  Obese female in no acute distress. HEENT:  Eyes; anicteric and noninjected.  External ENT negative. NECK:  Supple without adenopathy. SKIN:  Shows a papular rash of her back area, none over the extremities or abdomen. LUNGS:  Respiratory effort is good.  Lungs are clear. HEART:  Regular rate and rhythm.  No rubs, murmurs or extra sounds. ABDOMEN:  Positive bowel sounds.  Soft, nontender to palpation. NEUROLOGIC:  Motor strength is 5/5 bilateral upper extremities.  Lower extremity is not tested.  Right lower extremity is in the CPM machine. Left lower extremity roughly has 3- at the hip flexor, but has 4+ in the ankle dorsiflexor and plantar flexor.  Sensory exam is normal.  Cranial nerves II through XII are intact.  Mood, memory, affect, orientation and judgment are intact.  POST ADMISSION PHYSICIAN EVALUATION: 1. Functional deficits secondary to end-stage DJD, bilateral knees,  status post TKR, postoperative day #6. 2. The patient was admitted to receive collaborative interdisciplinary     care between the physiatrist, rehab nursing staff, and therapy     team. 3. The patient's level of medical complexity and substantial therapy     needs in context of that medical necessity cannot be provide at a     lesser intensity of care. 4. The patient has experienced substantial functional loss from her     baseline.  Upon functional assessment at the time of preadmission     screening, the patient was at a min-assist bed mobility, +2 total     for ambulation short distances with  walker, min-to-mod-assist with     transfers, total-assist lower body dressing.  Upon functional     assessment today, the patient has min-assist bed mobility, mod-to-     min-assist transfers, min-assist 40 feet with the walker, +2 total     lower body dressing, set up upper body dressing.  Judging by the     patient's diagnosis, physical exam, and functional history, the     patient has the potential for functional progress, which will     result in measurable gains while in the inpatient rehab.  These     gains will be of substantial and practical use upon discharge to     home in facilitating mobility, self-care, and independence.     Interim change in medical status since preadmission screening are     detailed in history of present illness. 5. Physiatrist will provide 24-hour management of medical needs as     well as oversight of the therapy plan/treatment and provide     guidance appropriate regarding interactions of the two. 6. A 24-hour rehab nursing will assess in the management of skin,     bowel, bladder, and help to integrate therapy, concepts,     techniques, and education. 7. PT will assess and treat for pre-gait training, gait training,     endurance, safety.  Equipment goals are for modified independent     level with all mobility. 8. OT will assess and treat for ADLs, equipment, safety, endurance,     goals are for modified independent level with upper body and lower     body ADLs. 9. Case management and social work will assess and treat for     psychosocial issues and discharge planning. 10.Team conference will be held weekly to assess the patient's     progress and goals and to determine barriers to discharge. 11.The patient has demonstrated sufficient medical stability and     exercise capacity to tolerate at least 3 of hours therapy per day     at least 5 days per week. 12.Estimated length of stay is 7 days.  Prognosis for further     functional improvement is  good.  MEDICAL PROBLEM LIST AND PLAN: 1. Deep venous thrombosis prophylaxis.  Xarelto daily. 2. Acute blood loss anemia.  Continue iron b.i.d.  Monitor hemoglobin     and hematocrit, recheck CBC in the morning. 3. Neutropenia.  Monitor counts, monitor for fever. 4. Urinary tract infection.  Cipro for 3 days.  Recheck UA C and S 48     hours after antibiotics completed.  Continue Coreg, Altace and     Norvasc. 5. Dyslipidemia.  Continue Zocor. 6. Pain, use p.r.n. oxycodone, Robaxin for spasm. 7. Rash over back likely contact, treat with 1% hydrocortisone cream.     Should improve with increased  mobilization, decrease time in bed.  Motivation and mood appeared to be adequate for full participation in inpatient rehabilitation program.  Discussed Rehab Medicine Service with the patient and husband, answered questions.     Erick Colace, M.D.     AEK/MEDQ  D:  09/27/2011  T:  09/28/2011  Job:  [id number]  cc:   Neta Mends. Fabian Sharp, MD Nicki Guadalajara, M.D. Ollen Gross, M.D.  Electronically Signed by Claudette Laws M.D. on 10/04/2011 12:34:07 PM

## 2011-10-19 ENCOUNTER — Telehealth: Payer: Self-pay | Admitting: *Deleted

## 2011-10-19 MED ORDER — CELECOXIB 200 MG PO CAPS: 200.0000 mg | ORAL_CAPSULE | Freq: Every day | ORAL | Status: DC

## 2011-10-19 NOTE — Telephone Encounter (Signed)
Refill on celebrex

## 2011-11-03 ENCOUNTER — Telehealth: Payer: Self-pay | Admitting: *Deleted

## 2012-01-04 ENCOUNTER — Telehealth: Payer: Self-pay | Admitting: Internal Medicine

## 2012-01-04 NOTE — Telephone Encounter (Signed)
Pt has changed from [facility] to [facility] and is needing new scripts for celecoxib (CELEBREX) 200 MG capsule,  CYMBALTA 60 MG capsule,  traMADol (ULTRAM) 50 MG tablet , 90 day supply to [facility] order pharmacy

## 2012-01-04 NOTE — Telephone Encounter (Signed)
Call pt and see if we are rx'ing the tramadol and how often is she taking it.

## 2012-01-05 MED ORDER — DULOXETINE HCL 60 MG PO CPEP: 60.0000 mg | ORAL_CAPSULE | Freq: Every day | ORAL | Status: DC

## 2012-01-05 MED ORDER — CELECOXIB 200 MG PO CAPS: 200.0000 mg | ORAL_CAPSULE | Freq: Every day | ORAL | Status: DC

## 2012-01-05 NOTE — Telephone Encounter (Signed)
Addended by: Romualdo Bolk on: 01/05/2012 11:42 AM   Modules accepted: Orders

## 2012-01-05 NOTE — Telephone Encounter (Signed)
rx sent to pharmacy

## 2012-01-05 NOTE — Telephone Encounter (Signed)
Spoke to pt and she states that not to worry about the tramadol. She is going to get ortho to do it and she doesn't need it now anyway. She will call back to make a follow up appt. She does need the celebrex and cymbalta.

## 2012-01-05 NOTE — Telephone Encounter (Signed)
Ok to refill the celebrex  and cymbalta for 90 days

## 2012-02-24 NOTE — Telephone Encounter (Signed)
Opened in error

## 2012-03-20 ENCOUNTER — Encounter: Payer: Self-pay | Admitting: Internal Medicine

## 2012-03-20 ENCOUNTER — Ambulatory Visit (INDEPENDENT_AMBULATORY_CARE_PROVIDER_SITE_OTHER): Payer: Medicare Other | Admitting: Internal Medicine

## 2012-03-20 VITALS — BP 112/72 | HR 86 | Ht 66.5 in | Wt 226.8 lb

## 2012-03-20 DIAGNOSIS — G4733 Obstructive sleep apnea (adult) (pediatric): Secondary | ICD-10-CM

## 2012-03-20 NOTE — Progress Notes (Signed)
    Patient ID: Terri Rodriguez, female    DOB: 04/26/1946, 66 y.o.   MRN: [id number]  HPI 51 yoF followed for obstructive sleep apnea on CPAP 11 cwp, complicated by allergic rhinitis and GERD. Last here March 23, 2010. She recently got over a sustained tracheobronchits and remains hoarse. After last here we were able to help her change DME to [facility] and get her a new machine. She also wears a bite guard. She is able to wear her mask all night every night and life is clearly better with it.   03/20/12- 65 yoF( MD- Internist) followed for obstructive sleep apnea on CPAP 11 cwp, complicated by allergic rhinitis and GERD. Continues CPAP all night every night auto set to a maximum pressure of 12/[facility]. She likes this and says she is very happy. Using nasal pillows and a bite guard. Sleeps well and wakes fully rest.  ROS-see HPI Constitutional:   No-   weight loss, night sweats, fevers, chills, fatigue, lassitude. HEENT:   No-  headaches, difficulty swallowing, tooth/dental problems, sore throat,     No- some sneezing, itching, ear ache, nasal congestion, post nasal drip,  CV:  No-   chest pain, orthopnea, PND, swelling in lower extremities, anasarca, dizziness, palpitations Resp: No-   shortness of breath with exertion or at rest.              No-   productive cough,  No non-productive cough,  No- coughing up of blood.              No-   change in color of mucus.  No- wheezing.   Skin: No-   rash or lesions. GI:  No-   heartburn, indigestion, abdominal pain, nausea, vomiting,  GU:n. MS:  No-   joint pain or swelling.   Neuro-     nothing unusual Psych:  No- change in mood or affect. No depression or anxiety.  No memory loss.    OBJ- Physical Exam General- Alert, Oriented, Affect-appropriate, Distress- none acute Skin- rash-none, lesions- none, excoriation- none Lymphadenopathy- none Head- atraumatic            Eyes- Gross vision intact, PERRLA, conjunctivae and secretions clear   Ears- Hearing, canals-normal            Nose- Clear, no-Septal dev, mucus, polyps, erosion, perforation             Throat- Mallampati II-III , mucosa clear , drainage- none, tonsils- atrophic Neck- flexible , trachea midline, no stridor , thyroid nl, carotid no bruit Chest - symmetrical excursion , unlabored           Heart/CV- RRR , no murmur , no gallop  , no rub, nl s1 s2                           - JVD- none , edema- none, stasis changes- none, varices- none           Lung- clear to P&A, wheeze- none, cough- none , dullness-none, rub- none           Chest wall-  Abd-  Br/ Gen/ Rectal- Not done, not indicated Extrem- cyanosis- none, clubbing, none, atrophy- none, strength- nl Neuro- grossly intact to observation

## 2012-03-20 NOTE — Patient Instructions (Signed)
Please call as needed  We will continue CPAP at [facility] with a max of 12.   Consider non-sedating Allegra/ fexofenadine as a slightly stronger alternative to Claritin

## 2012-03-21 ENCOUNTER — Other Ambulatory Visit: Payer: Self-pay | Admitting: Internal Medicine

## 2012-03-21 NOTE — Telephone Encounter (Signed)
Call Dr Ladona Ridgel   And tell her we will refill for 6 months worth but she is due for an office  visit  as her last check was feb of 2012 .    Congratulations on new granddaughter!Marland Kitchen

## 2012-03-21 NOTE — Telephone Encounter (Signed)
Pt needs cymbalta 60 mg once a day #90 with 3 refills also celebrex 200mg  #90 with 3 refills  Call [facility] to [facility] [phone]

## 2012-03-22 MED ORDER — CELECOXIB 200 MG PO CAPS: 200.0000 mg | ORAL_CAPSULE | Freq: Every day | ORAL | Status: DC

## 2012-03-22 MED ORDER — DULOXETINE HCL 60 MG PO CPEP: 60.0000 mg | ORAL_CAPSULE | Freq: Every day | ORAL | Status: DC

## 2012-03-25 ENCOUNTER — Encounter: Payer: Self-pay | Admitting: Internal Medicine

## 2012-03-25 NOTE — Assessment & Plan Note (Signed)
Good compliance and control. We will continue with an [employer] machine

## 2012-06-12 ENCOUNTER — Telehealth: Payer: Self-pay | Admitting: Internal Medicine

## 2012-06-12 NOTE — Telephone Encounter (Signed)
Caller: Terri Rodriguez/Patient; PCP: Madelin Headings.; CB#: [phone];  Call regarding Nose Injury; Larey Seat 06/09/12 and injured nose.  Pain where bone and cartilige join;  rated at 3 if nothing touches nose, increases to 6-7 with pressure.  No bleeding or broken skin.  Home care for the interim, parameters for callback.  Appt. scheduled for 1045 on 06/14/12; caller unable to schedule earlier due to conflict with appointment and jury duty.

## 2012-06-12 NOTE — Telephone Encounter (Signed)
Pt has appt with you.  Do you want x-ray before she comes?

## 2012-06-13 ENCOUNTER — Ambulatory Visit (INDEPENDENT_AMBULATORY_CARE_PROVIDER_SITE_OTHER)
Admission: RE | Admit: 2012-06-13 | Discharge: 2012-06-13 | Disposition: A | Discharge status: Home / Self Care | Payer: Medicare Other | Source: Ambulatory Visit | Attending: Internal Medicine | Admitting: Internal Medicine

## 2012-06-13 ENCOUNTER — Other Ambulatory Visit: Payer: Self-pay | Admitting: Family Medicine

## 2012-06-13 DIAGNOSIS — S0992XA Unspecified injury of nose, initial encounter: Secondary | ICD-10-CM

## 2012-06-13 DIAGNOSIS — S199XXA Unspecified injury of neck, initial encounter: Secondary | ICD-10-CM

## 2012-06-13 DIAGNOSIS — S0993XA Unspecified injury of face, initial encounter: Secondary | ICD-10-CM

## 2012-06-13 NOTE — Telephone Encounter (Signed)
Pt notified and will go to [facility].  Order has been placed in system.

## 2012-06-13 NOTE — Telephone Encounter (Signed)
If ok with patient have her get x ray of nasal bones before appt tomorrow. Please put in order Dx nose injury r/o fracture.

## 2012-06-14 ENCOUNTER — Encounter: Payer: Self-pay | Admitting: Internal Medicine

## 2012-06-14 ENCOUNTER — Ambulatory Visit (INDEPENDENT_AMBULATORY_CARE_PROVIDER_SITE_OTHER): Payer: Medicare Other | Admitting: Internal Medicine

## 2012-06-14 VITALS — BP 130/70 | HR 91 | Temp 98.4°F | Wt 233.0 lb

## 2012-06-14 DIAGNOSIS — S0993XA Unspecified injury of face, initial encounter: Secondary | ICD-10-CM

## 2012-06-14 DIAGNOSIS — S0992XA Unspecified injury of nose, initial encounter: Secondary | ICD-10-CM | POA: Insufficient documentation

## 2012-06-14 DIAGNOSIS — D126 Benign neoplasm of colon, unspecified: Secondary | ICD-10-CM

## 2012-06-14 DIAGNOSIS — G4733 Obstructive sleep apnea (adult) (pediatric): Secondary | ICD-10-CM

## 2012-06-14 DIAGNOSIS — Z9181 History of falling: Secondary | ICD-10-CM

## 2012-06-14 DIAGNOSIS — S199XXA Unspecified injury of neck, initial encounter: Secondary | ICD-10-CM

## 2012-06-14 NOTE — Progress Notes (Signed)
Subjective:    Patient ID: Terri Rodriguez, female    DOB: Sep 22, 1946, 66 y.o.   MRN: [id number]  HPI Patient comes in today for SDA for  new problem evaluation. See CAN note. Was in garden patio ; tripped over on even stone adn fell on knees and face in ground . No loc had nose bleed on left . No ha change in vision  . Had bruising and pain on nasal bridge . First day dec patency but better now. Uses cpap apparatus and able to get flow through the nose.   Had glasses on and flexed but didn't break.  Due for colon soon ? Has adenomatous polyps  . Asks about  Referral into [place] system. Has seen Dr Kinnie Scales in the past . Hx of nasal surgery  Dr Gerilyn Pilgrim in the past.  Review of Systems No constitutional  Symptoms  Bleeding increase headache change in vision hearing gi gu issues  Past history family history social history reviewed in the electronic medical record. Outpatient Encounter Prescriptions as of 06/14/2012  Medication Sig Dispense Refill  . acetaminophen (TYLENOL) 500 MG tablet Take 500 mg by mouth every 6 (six) hours as needed.        Marland Kitchen aluminum hydroxide-magnesium carbonate (GAVISCON) 31.7-137 MG/5ML suspension Take by mouth every 6 (six) hours as needed.        Marland Kitchen amLODipine (NORVASC) 5 MG tablet Take 5 mg by mouth daily.        Marland Kitchen aspirin 81 MG tablet Take 81 mg by mouth daily.        Marland Kitchen atorvastatin (LIPITOR) 20 MG tablet Take 20 mg by mouth daily.        . Azelaic Acid (FINACEA) 15 % cream Apply topically 2 (two) times daily. After skin is thoroughly washed and patted dry, gently but thoroughly massage a thin film of azelaic acid cream into the affected area twice daily, in the morning and evening.       . Calcium Carbonate-Vit D-Min (CALTRATE 600+D PLUS) 600-400 MG-UNIT per tablet Chew 1 tablet by mouth daily.        . carvedilol (COREG) 3.125 MG tablet Take 3.125 mg by mouth 2 (two) times daily with meals.        . celecoxib (CELEBREX) 200 MG capsule Take 1 capsule (200 mg total) by  mouth daily.  90 capsule  1  . Cholecalciferol (VITAMIN D) 2000 UNITS CAPS Take by mouth.        . DULoxetine (CYMBALTA) 60 MG capsule Take 1 capsule (60 mg total) by mouth daily.  90 capsule  1  . estradiol (VIVELLE-DOT) 0.0375 MG/24HR Place 1 patch onto the skin twice a week. Change every 4 days alt with 0.05mg /24 hr       . estradiol (VIVELLE-DOT) 0.05 MG/24HR Place 1 patch onto the skin once a week. Alt with 0.375mg        . loratadine (CLARITIN) 10 MG tablet Take 10 mg by mouth daily.        Marland Kitchen LORazepam (ATIVAN) 1 MG tablet Take 1 mg by mouth. Pre procedure       . MULTIPLE VITAMIN PO Take by mouth.        . nitroGLYCERIN (NITROSTAT) 0.4 MG SL tablet Place 0.4 mg under the tongue every 5 (five) minutes as needed.        . NON FORMULARY C-Pap 11 Advance       . omeprazole-sodium bicarbonate (ZEGERID) 40-1100 MG per capsule Take 1 capsule  by mouth every morning before breakfast.        . ramipril (ALTACE) 10 MG tablet Take 10 mg by mouth daily.        . traMADol (ULTRAM) 50 MG tablet Take 50 mg by mouth every 6 (six) hours as needed.        . valACYclovir (VALTREX) 1000 MG tablet Take 1,000 mg by mouth 2 (two) times daily.              Objective:   Physical Exam BP 130/70  Pulse 91  Temp 98.4 F (36.9 C) (Oral)  Wt 233 lb (105.688 kg)  SpO2 97% HEENT : [place]  Eyes clear and eoms nl  Sinus nontender  Bruise fading bridge of nose straight and no crepitus   nares patent no deformity and no blood  Neck: Supple without adenopathy or masses or bruits Neuro grossly non focal  Skin few bruises on arms.  X ray neg for fracture    Assessment & Plan:  Hx of trip fall  Pt showed  pict of area in patio   No concern about other cause of trip  Nasal injury no fr on x ray  Although can have one but no displacement and no obstruction . Options discussed continue and if having difficulties with patency nares then call for consult . OSA Hx of adenomatous polyps  Call if wishes Gi consult within  system.

## 2012-06-14 NOTE — Patient Instructions (Signed)
Monitor  And call inf  persistent or progressive concerns.  Call if wants to arrange  [facility] gi referral

## 2012-09-19 ENCOUNTER — Ambulatory Visit (INDEPENDENT_AMBULATORY_CARE_PROVIDER_SITE_OTHER): Payer: Medicare Other | Admitting: Internal Medicine

## 2012-09-19 ENCOUNTER — Encounter: Payer: Self-pay | Admitting: Internal Medicine

## 2012-09-19 VITALS — BP 110/62 | Temp 98.5°F | Wt 234.0 lb

## 2012-09-19 DIAGNOSIS — Z5181 Encounter for therapeutic drug level monitoring: Secondary | ICD-10-CM

## 2012-09-19 DIAGNOSIS — Z23 Encounter for immunization: Secondary | ICD-10-CM

## 2012-09-19 DIAGNOSIS — Z8679 Personal history of other diseases of the circulatory system: Secondary | ICD-10-CM

## 2012-09-19 DIAGNOSIS — Z299 Encounter for prophylactic measures, unspecified: Secondary | ICD-10-CM

## 2012-09-19 DIAGNOSIS — Z79890 Hormone replacement therapy (postmenopausal): Secondary | ICD-10-CM

## 2012-09-19 DIAGNOSIS — Z79899 Other long term (current) drug therapy: Secondary | ICD-10-CM

## 2012-09-19 DIAGNOSIS — M1990 Unspecified osteoarthritis, unspecified site: Secondary | ICD-10-CM

## 2012-09-19 DIAGNOSIS — Z860100 Personal history of colon polyps, unspecified: Secondary | ICD-10-CM

## 2012-09-19 DIAGNOSIS — J309 Allergic rhinitis, unspecified: Secondary | ICD-10-CM

## 2012-09-19 DIAGNOSIS — Z860101 Personal history of adenomatous and serrated colon polyps: Secondary | ICD-10-CM

## 2012-09-19 DIAGNOSIS — K219 Gastro-esophageal reflux disease without esophagitis: Secondary | ICD-10-CM

## 2012-09-19 DIAGNOSIS — E785 Hyperlipidemia, unspecified: Secondary | ICD-10-CM

## 2012-09-19 DIAGNOSIS — Z9889 Other specified postprocedural states: Secondary | ICD-10-CM

## 2012-09-19 DIAGNOSIS — G4733 Obstructive sleep apnea (adult) (pediatric): Secondary | ICD-10-CM

## 2012-09-19 LAB — LIPID PANEL
Cholesterol: 160 mg/dL (ref 0–200)
HDL: 80.3 mg/dL (ref >39.00)
LDL Cholesterol: 62 mg/dL (ref 0–99)
Total CHOL/HDL Ratio: 2
Triglycerides: 89 mg/dL (ref 0.0–149.0)
VLDL: 17.8 mg/dL (ref 0.0–40.0)

## 2012-09-19 LAB — CBC WITH DIFFERENTIAL/PLATELET
Basophils Absolute: 0 10*3/uL (ref 0.0–0.1)
Basophils Relative: 0.7 % (ref 0.0–3.0)
Eosinophils Absolute: 0.1 10*3/uL (ref 0.0–0.7)
Eosinophils Relative: 3.2 % (ref 0.0–5.0)
HCT: 37.9 % (ref 36.0–46.0)
Hemoglobin: 12.7 g/dL (ref 12.0–15.0)
Lymphocytes Relative: 34.1 % (ref 12.0–46.0)
Lymphs Abs: 1.2 10*3/uL (ref 0.7–4.0)
MCHC: 33.5 g/dL (ref 30.0–36.0)
MCV: 96.6 fl (ref 78.0–100.0)
Monocytes Absolute: 0.3 10*3/uL (ref 0.1–1.0)
Monocytes Relative: 8.5 % (ref 3.0–12.0)
Neutro Abs: 1.9 10*3/uL (ref 1.4–7.7)
Neutrophils Relative %: 53.5 % (ref 43.0–77.0)
Platelets: 214 10*3/uL (ref 150.0–400.0)
RBC: 3.92 Mil/uL (ref 3.87–5.11)
RDW: 14.3 % (ref 11.5–14.6)
WBC: 3.6 10*3/uL — ABNORMAL LOW (ref 4.5–10.5)

## 2012-09-19 LAB — BASIC METABOLIC PANEL
BUN: 24 mg/dL — ABNORMAL HIGH (ref 6–23)
CO2: 27 mEq/L (ref 19–32)
Calcium: 9.1 mg/dL (ref 8.4–10.5)
Chloride: 105 mEq/L (ref 96–112)
Creatinine, Ser: 0.8 mg/dL (ref 0.4–1.2)
GFR: 81.77 mL/min (ref >60.00)
Glucose, Bld: 99 mg/dL (ref 70–99)
Potassium: 4.5 mEq/L (ref 3.5–5.1)
Sodium: 139 mEq/L (ref 135–145)

## 2012-09-19 LAB — HEPATIC FUNCTION PANEL
ALT: 31 U/L (ref 0–35)
AST: 42 U/L — ABNORMAL HIGH (ref 0–37)
Albumin: 4 g/dL (ref 3.5–5.2)
Alkaline Phosphatase: 80 U/L (ref 39–117)
Bilirubin, Direct: 0.1 mg/dL (ref 0.0–0.3)
Total Bilirubin: 0.4 mg/dL (ref 0.3–1.2)
Total Protein: 6.9 g/dL (ref 6.0–8.3)

## 2012-09-19 LAB — HEMOGLOBIN A1C: Hgb A1c MFr Bld: 5.4 % (ref 4.6–6.5)

## 2012-09-19 LAB — TSH: TSH: 1.71 u[IU]/mL (ref 0.35–5.50)

## 2012-09-19 MED ORDER — ZOSTER VACCINE LIVE 19400 UNT/0.65ML ~~LOC~~ SOLR: 0.6500 mL | Freq: Once | SUBCUTANEOUS | Status: DC

## 2012-09-19 NOTE — Patient Instructions (Signed)
Say on cymbalta for pain management. Look  into water exercise other for pain management.  Will get gi referral. Plan wellness visit in 6 months Flu and zostavax.

## 2012-09-20 ENCOUNTER — Encounter: Payer: Self-pay | Admitting: Internal Medicine

## 2012-09-20 DIAGNOSIS — Z86010 Personal history of colonic polyps: Secondary | ICD-10-CM | POA: Insufficient documentation

## 2012-09-20 DIAGNOSIS — Z860101 Personal history of adenomatous and serrated colon polyps: Secondary | ICD-10-CM | POA: Insufficient documentation

## 2012-09-20 DIAGNOSIS — Z79890 Hormone replacement therapy (postmenopausal): Secondary | ICD-10-CM | POA: Insufficient documentation

## 2012-09-20 NOTE — Progress Notes (Signed)
Subjective:    Patient ID: Terri Rodriguez, female    DOB: 30-Apr-1946, 66 y.o.   MRN: [id number]  HPI Patient comes in today for follow up of  multiple medical problems.  No new medical dx .  back surgery still some pain has to sleep upright  Area pain in si areas. She has chornic pain  And manages on celebrex qd only ocass bid .  cymbalta has helped a great deal .   Husband thinks she may be somewhat depressed but pt reports  Increase and  Change in responsibilities are and issue  She care takes infant grand daughter and her daughter and son in low are temporarily living in her [facility].   No hopelessness  . More frustration.  Still see s Dr Tresa Endo cardiologist for cv risk management( had neg cath in past) . MS: knees stil grind after replacement but better than pre surgery . Back problematic  DJD oa  Sx  Cause pain and discomfort  Taking celebrex usually once a day only rarely bid . Needs ongoing refill . GYNE HRT per Dr Hyacinth Meeker low dose at present 1/2 of a .05 patch Allergic sx  Now on allegra 180  OSA  Sleeps mostly up in chair for machine and trying to get comfortable with her joints  Review of Systems Neg fever new cp sob sleep s up with osa  And pain vision change  Knees crunch  And make noise  Not as problematic as back.  No gi changes  Needs a gi doc in [employer]. Hx of adenomatous polyps. As per hpi  Past history family history social history reviewed in the electronic medical record.  Outpatient Encounter Prescriptions as of 09/19/2012  Medication Sig Dispense Refill  . acetaminophen (TYLENOL) 500 MG tablet Take 500 mg by mouth every 6 (six) hours as needed.        Marland Kitchen aluminum hydroxide-magnesium carbonate (GAVISCON) 31.7-137 MG/5ML suspension Take by mouth every 6 (six) hours as needed.        Marland Kitchen amLODipine (NORVASC) 5 MG tablet Take 5 mg by mouth daily.        Marland Kitchen aspirin 81 MG tablet Take 81 mg by mouth daily.        Marland Kitchen atorvastatin (LIPITOR) 20 MG tablet Take 20 mg by mouth daily.         . Azelaic Acid (FINACEA) 15 % cream Apply topically 2 (two) times daily. After skin is thoroughly washed and patted dry, gently but thoroughly massage a thin film of azelaic acid cream into the affected area twice daily, in the morning and evening.       . carvedilol (COREG) 3.125 MG tablet Take 3.125 mg by mouth 2 (two) times daily with meals.        . celecoxib (CELEBREX) 200 MG capsule Take 1 capsule (200 mg total) by mouth daily.  90 capsule  1  . Cholecalciferol (VITAMIN D) 2000 UNITS CAPS Take by mouth.        . DULoxetine (CYMBALTA) 60 MG capsule Take 1 capsule (60 mg total) by mouth daily.  90 capsule  1  . estradiol (CLIMARA - DOSED IN MG/24 HR) 0.025 mg/24hr Place 1 patch onto the skin once a week.      . estradiol (VIVELLE-DOT) 0.05 MG/24HR Place 1 patch onto the skin once a week. Alt with 0.375mg        . loratadine (CLARITIN) 10 MG tablet Take 10 mg by mouth daily.        Marland Kitchen  LORazepam (ATIVAN) 1 MG tablet Take 1 mg by mouth. Pre procedure       . metroNIDAZOLE (METROGEL) 0.75 % gel       . MULTIPLE VITAMIN PO Take by mouth.        . nitroGLYCERIN (NITROSTAT) 0.4 MG SL tablet Place 0.4 mg under the tongue every 5 (five) minutes as needed.        . NON FORMULARY C-Pap 11 Advance       . omeprazole-sodium bicarbonate (ZEGERID) 40-1100 MG per capsule Take 1 capsule by mouth every morning before breakfast.        . ramipril (ALTACE) 10 MG tablet Take 10 mg by mouth daily.        . traMADol (ULTRAM) 50 MG tablet Take 50 mg by mouth every 6 (six) hours as needed.        . valACYclovir (VALTREX) 1000 MG tablet Take 1,000 mg by mouth 2 (two) times daily.        Marland Kitchen DISCONTD: estradiol (VIVELLE-DOT) 0.0375 MG/24HR Place 1 patch onto the skin twice a week. Change every 4 days alt with 0.05mg /24 hr       . DISCONTD: Calcium Carbonate-Vit D-Min (CALTRATE 600+D PLUS) 600-400 MG-UNIT per tablet Chew 1 tablet by mouth daily.         Facility-Administered Encounter Medications as of 09/19/2012    Medication Dose Route Frequency Provider Last Rate Last Dose  . zoster vaccine live (PF) (ZOSTAVAX) injection 19,400 Units  0.65 mL Subcutaneous Once Madelin Headings, MD           Objective:   Physical Exam BP 110/62  Temp 98.5 F (36.9 C) (Oral)  Wt 234 lb (106.142 kg) WDWN in nad Oriented x 3 and no noted deficits in memory, attention, and speech. HEENT: Normocephalic ;atraumatic , Eyes;  PERRL, EOMs  Full, lids and conjunctiva clear,,Ears: no deformities, canals nl, TM landmarks normal, Nose: no deformity or discharge  Mouth : OP clear without lesion or edema . Chest:  Clear to A without wheezes rales or rhonchi CV:  S1-S2 no gallops or murmurs peripheral perfusion is normal Abdomen:  Sof,t normal bowel sounds without hepatosplenomegaly, no guarding rebound or masses no CVA tenderness No clubbing cyanosis trc  Edema pulses intact  Skin few chronic changes legs  No acute finding  Knees sig crepitus with squatting gait stable  Tender LS si areas      Assessment & Plan:   DJD  cymbalta helping along with secondary sx . Consider 90 mg at some point but may not help that much more  Consider relook rheum if progressing. She does have raynauds and  Low wbc  Chronic. Consider other causes periodically.  Med monitoring celebrex etc labs today GI hx of polyps adenomatous  Uncertain when next surveillance needed .  Other  gerd  Would like her to see Dr Louis Matte   On meds no change CV risk  Neg cad   On last cath No sx  ? If has had spasm  No dx otherwise  Monitoring per Cards . On lipid meds.  OSA  On cpap Allergic rhin HCM zostavax flu today

## 2012-09-20 NOTE — Assessment & Plan Note (Signed)
Monitor labs with celebrex

## 2012-09-22 ENCOUNTER — Encounter: Payer: Self-pay | Admitting: Internal Medicine

## 2012-09-22 DIAGNOSIS — Z5181 Encounter for therapeutic drug level monitoring: Secondary | ICD-10-CM | POA: Insufficient documentation

## 2012-09-22 DIAGNOSIS — Z9889 Other specified postprocedural states: Secondary | ICD-10-CM | POA: Insufficient documentation

## 2012-09-26 ENCOUNTER — Encounter: Payer: Self-pay | Admitting: Internal Medicine

## 2012-10-15 ENCOUNTER — Telehealth: Payer: Self-pay | Admitting: Internal Medicine

## 2012-10-15 NOTE — Telephone Encounter (Signed)
Pt needs new script for Valtrex. Thanks. [facility] [place]

## 2012-10-15 NOTE — Telephone Encounter (Signed)
Can't tell that you have ever filled this medication for her.  Please advise.  Thanks!!!

## 2012-10-15 NOTE — Telephone Encounter (Signed)
Ok  Its in the old EHR   Valtrex 1000 mg take 2 po bid prn  Disp 30 refill x 3

## 2012-10-17 MED ORDER — VALACYCLOVIR HCL 1 G PO TABS: 1000.0000 mg | ORAL_TABLET | Freq: Two times a day (BID) | ORAL | Status: DC

## 2012-10-17 NOTE — Telephone Encounter (Signed)
[facility].

## 2012-10-18 ENCOUNTER — Encounter: Payer: Self-pay | Admitting: Internal Medicine

## 2012-11-09 ENCOUNTER — Encounter: Payer: Self-pay | Admitting: *Deleted

## 2012-11-16 ENCOUNTER — Other Ambulatory Visit: Payer: Self-pay | Admitting: Family Medicine

## 2012-11-16 MED ORDER — VALACYCLOVIR HCL 1 G PO TABS: 1000.0000 mg | ORAL_TABLET | Freq: Two times a day (BID) | ORAL | Status: DC

## 2012-11-21 ENCOUNTER — Encounter: Payer: Self-pay | Admitting: Internal Medicine

## 2012-12-11 ENCOUNTER — Encounter: Payer: Self-pay | Admitting: Internal Medicine

## 2012-12-11 ENCOUNTER — Ambulatory Visit (INDEPENDENT_AMBULATORY_CARE_PROVIDER_SITE_OTHER): Payer: Medicare Other | Admitting: Internal Medicine

## 2012-12-11 VITALS — BP 126/68 | HR 72 | Ht 66.5 in | Wt 238.4 lb

## 2012-12-11 DIAGNOSIS — K625 Hemorrhage of anus and rectum: Secondary | ICD-10-CM

## 2012-12-11 DIAGNOSIS — K648 Other hemorrhoids: Secondary | ICD-10-CM

## 2012-12-11 DIAGNOSIS — R932 Abnormal findings on diagnostic imaging of liver and biliary tract: Secondary | ICD-10-CM

## 2012-12-11 DIAGNOSIS — K219 Gastro-esophageal reflux disease without esophagitis: Secondary | ICD-10-CM

## 2012-12-11 MED ORDER — HYDROCORTISONE ACE-PRAMOXINE 2.5-1 % RE CREA: TOPICAL_CREAM | RECTAL | Status: DC

## 2012-12-11 NOTE — Patient Instructions (Addendum)
We have sent the following medications to your pharmacy for you to pick up at your convenience: Analpram  You will be due for a recall colonoscopy in 06/2013. We will send you a reminder in the mail when it gets closer to that time.  CC: Dr Berniece Andreas

## 2012-12-11 NOTE — Progress Notes (Signed)
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 She has gastroesophageal reflux which has been under good control with OTC Zegrid 20 mg and omeprazole 20 mg daily. She also had symptomatic hemorrhoids which were previously evaluated by Dr.Medoff. Her last colonoscopy in July 2009 showed internal hemorrhoids as well as an adenomatous polyp. She has a positive family history of colon cancer in 2 of her mother's sisters. She has chronic constipation. She has had limited exercise because of bilateral total knee replacements and arthritis in her neck. She has been on Celebrex 200 mg daily. She has always been overweight. She is also on psychotropic medications. She was diagnosed with irritable bowel syndrome at age 65 att [facility] when she underwent series of tests including an endoscopy, barium studies and a rigid sigmoidoscopy. As a child. she was given castor oil by her mother for chronic constipation.   Past Medical History  Diagnosis Date  . HYPERLIPIDEMIA   . UNSPECIFIED ANEMIA   . LEUKOPENIA, CHRONIC   . SLEEP APNEA, OBSTRUCTIVE   . ALLERGIC RHINITIS   . GERD   . Rosacea   . EPICONDYLITIS, LATERAL   . RAYNAUD'S SYNDROME, HX OF   . Hx of adenomatous polyp of colon 06/05/08  . Hx of cardiac catheterization 2008     clean coronarys DrKelly   . Hx of chest pain     neg cath remot hx of narrowwing lad in 2003 nl 2008  . Nasal injury 06/14/2012  . Diverticulitis   . Hepatic hemangioma 06/09/08  . Mononucleosis 1963  . Subacute thyroiditis   . IBS (irritable bowel syndrome)   . Hiatal hernia 03/2000  . Cholelithiasis 06/09/08   Past Surgical History  Procedure Date  . Abdominal hysterectomy   . Tonsillectomy   . Cesarean section 1985, 1989  . Dilation and curettage of uterus   . Nasal septoplasty w/ turbinoplasty   . Lumbar surgery fixation with disc replacement 10/08    Left  . Knee  arthroscopy     bilateral  . Replacement total knee     bilateral    reports that she has never smoked. She has never used smokeless tobacco. She reports that she drinks about 9 ounces of alcohol per week. She reports that she does not use illicit drugs. family history includes Atrial fibrillation in her father; Colon cancer in her maternal aunt; Deep vein thrombosis in her father; Dementia in her father; Diabetes in her mother; Heart failure in her mother; Inflammatory bowel disease in an unspecified family member; Leukemia in an unspecified family member; Obesity in her brothers; Osteoarthritis in her father; Pancreatic cancer in an unspecified family member; Pulmonary embolism in her father; Rheum arthritis in her mother; Sleep apnea in her brothers, father, and mother; Thrombocytopenia in her mother; Ulcers in her mother; and Uterine cancer in an unspecified family member. No Known Allergies      Review of Systems:Denies dysphagia odynophagia chest pain or abdominal pain  The remainder of the 10 point ROS is negative except as outlined in H&P   Physical Exam: General appearance  Well developed, in no distress.Overweight  Eyes- non icteric. HEENT nontraumatic, normocephalic. Mouth no lesions, tongue papillated, no cheilosis. Neck supple without adenopathy, thyroid not enlarged, no carotid bruits, no JVD. Lungs Clear to auscultation bilaterally. Cor normal S1, normal S2, regular rhythm, no murmur,  quiet precordium. Abdomen: Obese, soft nontender with normoactive  bowel sounds. Liver edge at costal margin. Rectal:And anoscopic exam reveals normal perianal area with small skin tag externally. Normal rectal sphincter tone. Small first degree internal hemorrhoids without prolapse. Rectal ampulla with Hemoccult negative stool.  Extremities no pedal edema. Status post bilateral knee replacements.  Skin no lesions. Neurological alert and oriented x 3. Psychological normal mood and  affect.  Assessment and Plan:  Problem #1 Gastroesophageal reflux disease under good control with proton pump inhibitors which she uses over-the-counter. She will continue antireflux measures and will join weight watcher's for weight loss.  Problem #2 History of hemangioma of the liver. Her last MRI was in 2009. She will be due for a repeat MRI this year. She would like to have it at the same time as her MRI of the lower spine which will be ordered by Dr.Nudelman in the near future.  Problem #3 Family history of colorectal cancer in 2 maternal aunts. She has a personal history of an adenomatous polyp. She will be due for a repeat colonoscopy in July 2014. She will receive a reminder letter in the mail from our office.  Problem #4 Symptomatic internal/external hemorrhoids. I will prescribe Analpram cream 2.5% to use on an as necessary basis.     12/11/2012 Lina Sar

## 2013-01-02 ENCOUNTER — Other Ambulatory Visit: Payer: Self-pay | Admitting: Family Medicine

## 2013-01-02 MED ORDER — VALACYCLOVIR HCL 1 G PO TABS: 1000.0000 mg | ORAL_TABLET | Freq: Two times a day (BID) | ORAL | Status: DC

## 2013-02-04 ENCOUNTER — Other Ambulatory Visit: Payer: Self-pay | Admitting: Neurosurgery

## 2013-02-04 DIAGNOSIS — M545 Low back pain: Secondary | ICD-10-CM

## 2013-02-04 DIAGNOSIS — M5137 Other intervertebral disc degeneration, lumbosacral region: Secondary | ICD-10-CM

## 2013-02-04 DIAGNOSIS — M47817 Spondylosis without myelopathy or radiculopathy, lumbosacral region: Secondary | ICD-10-CM

## 2013-02-12 ENCOUNTER — Ambulatory Visit
Admission: RE | Admit: 2013-02-12 | Discharge: 2013-02-12 | Disposition: A | Discharge status: Home / Self Care | Payer: Medicare Other | Source: Ambulatory Visit | Attending: Neurosurgery | Admitting: Neurosurgery

## 2013-02-12 DIAGNOSIS — M47817 Spondylosis without myelopathy or radiculopathy, lumbosacral region: Secondary | ICD-10-CM

## 2013-02-12 DIAGNOSIS — M545 Low back pain: Secondary | ICD-10-CM

## 2013-02-12 DIAGNOSIS — M5137 Other intervertebral disc degeneration, lumbosacral region: Secondary | ICD-10-CM

## 2013-02-12 MED ORDER — GADOBENATE DIMEGLUMINE 529 MG/ML IV SOLN
20.0000 mL | Freq: Once | INTRAVENOUS | Status: AC | PRN
  Administered 2013-02-12: 20 mL via INTRAVENOUS

## 2013-05-15 ENCOUNTER — Encounter: Payer: Self-pay | Admitting: Internal Medicine

## 2013-05-15 ENCOUNTER — Ambulatory Visit (INDEPENDENT_AMBULATORY_CARE_PROVIDER_SITE_OTHER): Payer: Medicare Other | Admitting: Internal Medicine

## 2013-05-15 VITALS — BP 106/68 | HR 81 | Temp 98.6°F | Wt 238.0 lb

## 2013-05-15 DIAGNOSIS — M1990 Unspecified osteoarthritis, unspecified site: Secondary | ICD-10-CM

## 2013-05-15 DIAGNOSIS — M25559 Pain in unspecified hip: Secondary | ICD-10-CM

## 2013-05-15 DIAGNOSIS — M25552 Pain in left hip: Secondary | ICD-10-CM

## 2013-05-15 NOTE — Progress Notes (Signed)
Chief Complaint  Patient presents with  . Left hip pain    Started 6 weeks ago.  Has taken Tylenol and Tramadol.  Not much relief with either.  Pain varies throughout the day.  At times it is 10 on the pain scale of 1-10.    HPI: Onset about 6 weeks ago  Left  Hip    Some using    No specific injury. Some worse in   Walking and pain is  Better in am and then goes bad.   Hard to walk.  Better with rest   Taking her celebrex has been pretty inactive with this   And not getting better . Catches at time  Worse with active than passive rom sometimes radiates to  Left knee area.   Has to lift granddaughter differently .   ROS: See pertinent positives and negatives per HPI. No fever falls   Has hs of DJD no other change    Past Medical History  Diagnosis Date  . HYPERLIPIDEMIA   . UNSPECIFIED ANEMIA   . LEUKOPENIA, CHRONIC   . SLEEP APNEA, OBSTRUCTIVE   . ALLERGIC RHINITIS   . GERD   . Rosacea   . EPICONDYLITIS, LATERAL   . RAYNAUD'S SYNDROME, HX OF   . Hx of adenomatous polyp of colon 06/05/08  . Hx of cardiac catheterization 2008     clean coronarys DrKelly   . Hx of chest pain     neg cath remot hx of narrowwing lad in 2003 nl 2008  . Nasal injury 06/14/2012  . Diverticulitis   . Hepatic hemangioma 06/09/08  . Mononucleosis 1963  . Subacute thyroiditis   . IBS (irritable bowel syndrome)   . Hiatal hernia 03/2000  . Cholelithiasis 06/09/08    Family History  Problem Relation Age of Onset  . Rheum arthritis Mother   . Diabetes Mother   . Heart failure Mother   . Thrombocytopenia Mother   . Sleep apnea Mother   . Deep vein thrombosis Father   . Pulmonary embolism Father   . Osteoarthritis Father   . Atrial fibrillation Father   . Dementia Father   . Sleep apnea Father   . Sleep apnea Brother   . Obesity Brother   . Sleep apnea Brother   . Obesity Brother   . Ulcers Mother     PUD  . Colon cancer Maternal Aunt     x 2  . Pancreatic cancer    . Uterine cancer    . Leukemia     . Inflammatory bowel disease      aunt    History   Social History  . Marital Status: Married    Spouse Name: N/A    Number of Children: 1  . Years of Education: N/A   Occupational History  . Physician    Social History Main Topics  . Smoking status: Never Smoker   . Smokeless tobacco: Never Used  . Alcohol Use: 9.0 oz/week    15 Glasses of wine per week  . Drug Use: No  . Sexually Active: None   Other Topics Concern  . 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 acetaminophen (TYLENOL) 500 MG tablet Take 500 mg by mouth every 6 (six) hours as needed.        Marland Kitchen aluminum hydroxide-magnesium carbonate (GAVISCON) 31.7-137 MG/5ML suspension Take by mouth every 6 (six) hours as needed.        Marland Kitchen amLODipine (NORVASC) 5 MG tablet Take 5 mg by mouth daily.        Marland Kitchen aspirin 81 MG tablet Take 81 mg by mouth daily.        Marland Kitchen atorvastatin (LIPITOR) 20 MG tablet Take 20 mg by mouth daily.        . Azelaic Acid (FINACEA) 15 % cream Apply topically 2 (two) times daily. After skin is thoroughly washed and patted dry, gently but thoroughly massage a thin film of azelaic acid cream into the affected area twice daily, in the morning and evening.       . carvedilol (COREG) 3.125 MG tablet Take 3.125 mg by mouth 2 (two) times daily with meals.        . celecoxib (CELEBREX) 200 MG capsule Take 1 capsule (200 mg total) by mouth daily.  90 capsule  1  . Cholecalciferol (VITAMIN D) 2000 UNITS CAPS Take by mouth.        . DULoxetine (CYMBALTA) 60 MG capsule Take 1 capsule (60 mg total) by mouth daily.  90 capsule  1  . estradiol (VIVELLE-DOT) 0.05 MG/24HR Place 1 patch (0.05 mg total) onto the skin once a week. Can use half .025 or  as per GYNE  8 patch    . fexofenadine (ALLEGRA) 180 MG tablet Take 180 mg by mouth daily.      . hydrocortisone-pramoxine (ANALPRAM-HC) 2.5-1 % rectal cream Apply to rectum 2-3 times daily as needed.  30 g  1  . LORazepam (ATIVAN) 1 MG tablet Take 1 mg by mouth. Pre procedure       . metroNIDAZOLE (METROGEL) 0.75 % gel       . MULTIPLE VITAMIN PO Take by mouth.        . nitroGLYCERIN (NITROSTAT) 0.4 MG SL tablet Place 0.4 mg under the tongue every 5 (five) minutes as needed.        . NON FORMULARY C-Pap 11 Advance       . omeprazole-sodium bicarbonate (ZEGERID) 40-1100 MG per capsule Take 1 capsule by mouth every morning before breakfast.        . ramipril (ALTACE) 10 MG tablet Take 10 mg by mouth daily.        . traMADol (ULTRAM) 50 MG tablet Take 50 mg by mouth every 6 (six) hours as needed.        . valACYclovir (VALTREX) 1000 MG tablet Take 1 tablet (1,000 mg total) by mouth 2 (two) times daily.  90 tablet  0   No facility-administered encounter medications on file as of 05/15/2013.    EXAM:  BP 106/68  Pulse 81  Temp(Src) 98.6 F (37 C) (Oral)  Wt 238 lb (107.956 kg)  BMI 37.84 kg/m2  SpO2 98%  Body mass index is 37.84 kg/(m^2).  GENERAL: vitals reviewed and listed above, alert, oriented, appears well hydrated and in no acute distress walks with a limp and using a cane  Ambulatory  MS: moves all extremities    Left hip passive  failry good rom pain with extension and when weight bears  Noted  Left lateral  Groin ileal area   No obv weakness .   PSYCH: pleasant and cooperative,  no obvious depression or anxiety  ASSESSMENT AND PLAN:  Discussed the following assessment and plan:  Left hip pain new onset. - Plan: Ambulatory referral to Orthopedic Surgery  OSTEOARTHRITIS Underlying djd  No fall or acute injury but some increase ? Hip overuse?   Needs imaging and further evaluation   Hard to ambulate at this time  And has tried rest and celebrex . over 6 weeks.  Can increase  celebrex to bid samples given in the short run   .    Underlying conditions  Are no acute changes at this time.  -Patient advised to return or notify health care team  if symptoms worsen or persist or new concerns arise.  Patient Instructions  Will do referral to ortho about the left hip.     Pain  In the meantime  Increase celebrex to twice a day.      Neta Mends. Kalel Harty M.D.

## 2013-05-15 NOTE — Patient Instructions (Signed)
Will do referral to ortho about the left hip.     Pain  In the meantime  Increase celebrex to twice a day.

## 2013-05-29 ENCOUNTER — Other Ambulatory Visit: Payer: Self-pay

## 2013-05-29 MED ORDER — AMLODIPINE BESYLATE 5 MG PO TABS: 5.0000 mg | ORAL_TABLET | Freq: Every day | ORAL | Status: DC

## 2013-05-29 MED ORDER — CARVEDILOL 3.125 MG PO TABS: 3.1250 mg | ORAL_TABLET | Freq: Two times a day (BID) | ORAL | Status: DC

## 2013-05-29 MED ORDER — RAMIPRIL 10 MG PO TABS: 10.0000 mg | ORAL_TABLET | Freq: Every day | ORAL | Status: DC

## 2013-05-29 MED ORDER — ATORVASTATIN CALCIUM 20 MG PO TABS: 20.0000 mg | ORAL_TABLET | Freq: Every day | ORAL | Status: DC

## 2013-05-29 NOTE — Telephone Encounter (Signed)
Rx was sent to pharmacy electronically. 

## 2013-05-31 ENCOUNTER — Telehealth: Payer: Self-pay | Admitting: Obstetrics & Gynecology

## 2013-05-31 NOTE — Telephone Encounter (Signed)
Patient needs prior authorization for vivelle [employer] [id number] option #2 [id number] prescription number Please call patient when and let know when this has been completed.

## 2013-06-03 NOTE — Telephone Encounter (Signed)
Started PA process for Vivelle dot. Rep to fax paperwork to [place] for [place] to return completed within 72 hrs.

## 2013-06-04 ENCOUNTER — Telehealth: Payer: Self-pay | Admitting: Orthopedic Surgery

## 2013-06-04 NOTE — Telephone Encounter (Signed)
Spoke with BCBS representative about pt's prior authorization for Vivelle dot. Answered questions about diagnosis and when pt started on med. Representative approved PA through 06-03-14 and advised we do not need to fax form.

## 2013-06-18 ENCOUNTER — Encounter: Payer: Self-pay | Admitting: Internal Medicine

## 2013-07-22 ENCOUNTER — Telehealth: Payer: Self-pay | Admitting: Family Medicine

## 2013-07-22 MED ORDER — CELECOXIB 200 MG PO CAPS: 200.0000 mg | ORAL_CAPSULE | Freq: Every day | ORAL | Status: DC

## 2013-07-22 MED ORDER — DULOXETINE HCL 60 MG PO CPEP: 60.0000 mg | ORAL_CAPSULE | Freq: Every day | ORAL | Status: DC

## 2013-07-22 NOTE — Telephone Encounter (Signed)
Ok to refill cymbalta for 1 year  celebrex for 6 months

## 2013-07-22 NOTE — Telephone Encounter (Signed)
Sent by e-scribe. 

## 2013-07-22 NOTE — Telephone Encounter (Signed)
Cymbalta last filled on 03/22/12 #90 with 1 additional refill. Celebrex last filled on 03/22/12 #90 with 1 additional refill Last seen on 05/15/13 for hip pain No future appt scheduled. [facility]. Please advise. Thanks!

## 2013-08-14 ENCOUNTER — Telehealth: Payer: Self-pay | Admitting: *Deleted

## 2013-08-14 ENCOUNTER — Encounter: Payer: Self-pay | Admitting: *Deleted

## 2013-08-14 NOTE — Telephone Encounter (Signed)
Faxed surgical clearance to have left hip surgery.

## 2013-08-16 ENCOUNTER — Telehealth: Payer: Self-pay | Admitting: Family Medicine

## 2013-08-16 NOTE — Telephone Encounter (Signed)
Received a fax from [facility].  This patient is scheduled to have hip surgery on 10/21/13.  She needs clearance from [facility].  Please make 30 minute appt.  Thanks! Please send this back to me with the date she is coming.

## 2013-08-19 NOTE — Telephone Encounter (Signed)
Scheduled for 09/09/13 @ 1:45pm.

## 2013-08-19 NOTE — Telephone Encounter (Signed)
Noted  

## 2013-09-06 ENCOUNTER — Ambulatory Visit (INDEPENDENT_AMBULATORY_CARE_PROVIDER_SITE_OTHER): Payer: Medicare Other

## 2013-09-06 DIAGNOSIS — Z23 Encounter for immunization: Secondary | ICD-10-CM

## 2013-09-09 ENCOUNTER — Ambulatory Visit: Payer: Medicare Other | Admitting: Internal Medicine

## 2013-09-16 ENCOUNTER — Ambulatory Visit (INDEPENDENT_AMBULATORY_CARE_PROVIDER_SITE_OTHER): Payer: Medicare Other | Admitting: Internal Medicine

## 2013-09-16 ENCOUNTER — Encounter: Payer: Self-pay | Admitting: Internal Medicine

## 2013-09-16 ENCOUNTER — Ambulatory Visit: Payer: Medicare Other | Admitting: Internal Medicine

## 2013-09-16 VITALS — BP 104/56 | HR 79 | Temp 98.5°F | Wt 242.0 lb

## 2013-09-16 DIAGNOSIS — M1990 Unspecified osteoarthritis, unspecified site: Secondary | ICD-10-CM

## 2013-09-16 DIAGNOSIS — J209 Acute bronchitis, unspecified: Secondary | ICD-10-CM | POA: Insufficient documentation

## 2013-09-16 DIAGNOSIS — G4733 Obstructive sleep apnea (adult) (pediatric): Secondary | ICD-10-CM

## 2013-09-16 MED ORDER — AZITHROMYCIN 250 MG PO TABS: 250.0000 mg | ORAL_TABLET | ORAL | Status: DC

## 2013-09-16 NOTE — Progress Notes (Signed)
Chief Complaint  Patient presents with  . Cough    Started last Monday morning.  Colored drainage.  . Nasal Congestion  . Hoarse  . Shortness of Breath    HPI: Patient comes in today for SDA for  new problem evaluation. Here with husband ambulates with cane 1 weeks   of  cough upper respiratory symptoms Copious phlegm   For the past  Week.   No sig sinus sx but has pnd.  No fever noted but did have body aches and chills.    Continued coughing and not as bad. Is on CPAP. No hemoptysis although early on there were streaks of blood she felt may be from the nasal drainage.  Used to get this about 3 x per year and was less. After going on allergy meds however this is a bad episode she is now taking care of her grandchild who goes to daycare and does bring home respiratory infections no face pain fever but feels very bad and is worse She is scheduled for left hip replacement surgery in November. Is quite frustrated that her mobility has taken a turn for the worse with her osteoarthritis symptoms. Is on the schedule for preoperative evaluation next week her cardiologist is cleared her for surgery.  ROS: See pertinent positives and negatives per HPI.  Past Medical History  Diagnosis Date  . HYPERLIPIDEMIA   . UNSPECIFIED ANEMIA   . LEUKOPENIA, CHRONIC   . SLEEP APNEA, OBSTRUCTIVE   . ALLERGIC RHINITIS   . GERD   . Rosacea   . EPICONDYLITIS, LATERAL   . RAYNAUD'S SYNDROME, HX OF   . Hx of adenomatous polyp of colon 06/05/08  . Hx of cardiac catheterization 2008     clean coronarys DrKelly   . Hx of chest pain     neg cath remot hx of narrowwing lad in 2003 nl 2008  . Nasal injury 06/14/2012  . Diverticulitis   . Hepatic hemangioma 06/09/08  . Mononucleosis 1963  . Subacute thyroiditis   . IBS (irritable bowel syndrome)   . Hiatal hernia 03/2000  . Cholelithiasis 06/09/08    Family History  Problem Relation Age of Onset  . Rheum arthritis Mother   . Diabetes Mother   . Heart failure  Mother   . Thrombocytopenia Mother   . Sleep apnea Mother   . Deep vein thrombosis Father   . Pulmonary embolism Father   . Osteoarthritis Father   . Atrial fibrillation Father   . Dementia Father   . Sleep apnea Father   . Sleep apnea Brother   . Obesity Brother   . Sleep apnea Brother   . Obesity Brother   . Ulcers Mother     PUD  . Colon cancer Maternal Aunt     x 2  . Pancreatic cancer    . Uterine cancer    . Leukemia    . Inflammatory bowel disease      aunt    History   Social History  . Marital Status: Married    Spouse Name: N/A    Number of Children: 1  . Years of Education: N/A   Occupational History  . Physician    Social History Main Topics  . Smoking status: Never Smoker   . Smokeless tobacco: Never Used  . Alcohol Use: 9.0 oz/week    15 Glasses of wine per week  . Drug Use: No  . Sexual Activity: None   Other Topics Concern  . 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 acetaminophen (TYLENOL) 500 MG tablet Take 500 mg by mouth every 6 (six) hours as needed.        Marland Kitchen aluminum hydroxide-magnesium carbonate (GAVISCON) 31.7-137 MG/5ML suspension Take by mouth every 6 (six) hours as needed.        Marland Kitchen amLODipine (NORVASC) 5 MG tablet Take 1 tablet (5 mg total) by mouth daily.  90 tablet  2  . aspirin 81 MG tablet Take 81 mg by mouth daily.        Marland Kitchen atorvastatin (LIPITOR) 20 MG tablet Take 1 tablet (20 mg total) by mouth daily.  90 tablet  2  . Azelaic Acid (FINACEA) 15 % cream Apply topically 2 (two) times daily. After skin is thoroughly washed and patted dry, gently but thoroughly massage a thin film of azelaic acid cream into the affected area twice daily, in the morning and  evening.       . carvedilol (COREG) 3.125 MG tablet Take 1 tablet (3.125 mg total) by mouth 2 (two) times daily with a meal.  180 tablet  2  . celecoxib (CELEBREX) 200 MG capsule Take 1 capsule (200 mg total) by mouth daily.  90 capsule  1  . Cholecalciferol (VITAMIN D) 2000 UNITS CAPS Take by mouth.        . DULoxetine (CYMBALTA) 60 MG capsule Take 1 capsule (60 mg total) by mouth daily.  90 capsule  3  . estradiol (VIVELLE-DOT) 0.05 MG/24HR Place 1 patch (0.05 mg total) onto the skin once a week. Can use half .025 or as per GYNE  8 patch    . fexofenadine (ALLEGRA) 180 MG tablet Take 180 mg by mouth daily.      Marland Kitchen HYDROcodone-acetaminophen (NORCO/VICODIN) 5-325 MG per tablet       . hydrocortisone-pramoxine (ANALPRAM-HC) 2.5-1 % rectal cream Apply to rectum 2-3 times daily as needed.  30 g  1  . LORazepam (ATIVAN) 1 MG tablet Take 1 mg by mouth. Pre procedure       . metroNIDAZOLE (METROGEL) 0.75 % gel       . MULTIPLE VITAMIN PO Take by mouth.        . nitroGLYCERIN (NITROSTAT) 0.4 MG SL tablet Place 0.4 mg under the tongue every 5 (five) minutes as needed.        . NON FORMULARY C-Pap 11 Advance       . omeprazole-sodium bicarbonate (ZEGERID) 40-1100 MG per capsule Take 1 capsule by mouth every morning before breakfast.        . ramipril (ALTACE) 10 MG tablet Take 1 tablet (10 mg total) by mouth daily.  90 tablet  2  . valACYclovir (VALTREX) 1000 MG tablet Take 1 tablet (1,000 mg total) by mouth 2 (two) times daily.  90 tablet  0  . azithromycin (ZITHROMAX Z-PAK) 250 MG tablet Take 1 tablet (250 mg total) by mouth as directed. Take 2 po first day, then 1 po qd  6 each  0  . traMADol (ULTRAM) 50 MG tablet Take 50 mg by mouth every 6 (six) hours as needed.         No facility-administered encounter  medications on file as of 09/16/2013.    EXAM:  BP 104/56  Pulse 79  Temp(Src) 98.5 F (36.9 C) (Oral)  Wt 242 lb (109.77 kg)  BMI 38.48 kg/m2  SpO2 95%  Body mass index is 38.48  kg/(m^2).  GENERAL: vitals reviewed and listed above, alert, oriented, appears well hydrated and in no acute distress she is quite hoarse with deep bronchial cough. HEENT: atraumatic, conjunctiva  clear, no obvious abnormalities on inspection of external nose and ears TMs are clear nares mild congestion face nontender OP : no lesion edema or exudate  NECK: no obvious masses on inspection palpation no JVD no significant adenopathy noted LUNGS: to auscultation bilaterally, no wheezes, rales or rhonchi,  upper airway tubular sounds noted. No respiratory distress CV: HRRR, no clubbing cyanosis or  peripheral edema nl cap refill  MS: moves all extremities  osteoarthritis changes left hand walks with cane favors left hip PSYCH: pleasant and cooperative,  ASSESSMENT AND PLAN:  Discussed the following assessment and plan:  Acute bronchitis - Significant severity reasonable to do empiric antibiotic based on worsening and context.  OSTEOARTHRITIS - end stage left hip   SLEEP APNEA, OBSTRUCTIVE  -Patient advised to return or notify health care team  if symptoms worsen or persist or new concerns arise.  Patient Instructions  This may be a   Bacterial bronchitis   Take antibiotic.  Contact us if not getting better in the next 5 days or so.      Neta Mends. Panosh M.D.

## 2013-09-16 NOTE — Patient Instructions (Signed)
This may be a   Bacterial bronchitis   Take antibiotic.  Contact us if not getting better in the next 5 days or so.

## 2013-09-24 ENCOUNTER — Ambulatory Visit (INDEPENDENT_AMBULATORY_CARE_PROVIDER_SITE_OTHER): Payer: Medicare Other | Admitting: Internal Medicine

## 2013-09-24 ENCOUNTER — Encounter: Payer: Self-pay | Admitting: Internal Medicine

## 2013-09-24 VITALS — BP 118/70 | HR 78 | Temp 98.1°F | Wt 242.0 lb

## 2013-09-24 DIAGNOSIS — G4733 Obstructive sleep apnea (adult) (pediatric): Secondary | ICD-10-CM

## 2013-09-24 DIAGNOSIS — M169 Osteoarthritis of hip, unspecified: Secondary | ICD-10-CM

## 2013-09-24 DIAGNOSIS — M1612 Unilateral primary osteoarthritis, left hip: Secondary | ICD-10-CM

## 2013-09-24 DIAGNOSIS — Z01818 Encounter for other preprocedural examination: Secondary | ICD-10-CM

## 2013-09-24 DIAGNOSIS — Z8679 Personal history of other diseases of the circulatory system: Secondary | ICD-10-CM

## 2013-09-24 NOTE — Patient Instructions (Signed)
Will sent note to  [facility] about proceeding with surgery.  Can get tetanus  Booster in the future  At any time.

## 2013-09-27 DIAGNOSIS — M1612 Unilateral primary osteoarthritis, left hip: Secondary | ICD-10-CM | POA: Insufficient documentation

## 2013-09-27 DIAGNOSIS — Z01818 Encounter for other preprocedural examination: Secondary | ICD-10-CM | POA: Insufficient documentation

## 2013-09-27 NOTE — Progress Notes (Signed)
Chief Complaint  Patient presents with  . Follow-up  . 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  Since her last visit her respiratory infection is much improved no current chest pain shortness of breath. She's completed the azithromycin treatment. She has a clearance to go forward with surgery from her cardiologist. No recent history of stroke seizures chest pain heart failure asthmatic symptoms. She does have sleep apnea under treatment. She doesn't have hypertension but is on medications for raynauds  syndrome. ROS: See pertinent positives and negatives per HPI.  Past Medical History  Diagnosis Date  . HYPERLIPIDEMIA   . UNSPECIFIED ANEMIA   . LEUKOPENIA, CHRONIC   . SLEEP APNEA, OBSTRUCTIVE   . ALLERGIC RHINITIS   . GERD   . Rosacea   . EPICONDYLITIS, LATERAL   . RAYNAUD'S SYNDROME, HX OF   . Hx of adenomatous polyp of colon 06/05/08  . Hx of cardiac catheterization 2008     clean coronarys DrKelly   . Hx of chest pain     neg cath remot hx of narrowwing lad in 2003 nl 2008  . Nasal injury 06/14/2012  . Diverticulitis   . Hepatic hemangioma 06/09/08  . Mononucleosis 1963  . Subacute thyroiditis   . IBS (irritable bowel syndrome)   . Hiatal hernia 03/2000  . Cholelithiasis 06/09/08    Family History  Problem Relation Age of Onset  . Rheum arthritis Mother   . Diabetes Mother   . Heart failure Mother   . Thrombocytopenia Mother   . Sleep apnea Mother   . Deep vein thrombosis Father   . Pulmonary embolism Father   . Osteoarthritis Father   . Atrial fibrillation Father   . Dementia Father   . Sleep apnea Father   . Sleep apnea Brother   . Obesity Brother   . Sleep apnea Brother   . Obesity Brother   . Ulcers Mother     PUD  . Colon cancer Maternal Aunt     x 2  . Pancreatic cancer    . Uterine cancer    . Leukemia    . Inflammatory  bowel disease      aunt  . Congenital adrenal hyperplasia Grandchild     History   Social History  . Marital Status: Married    Spouse Name: N/A    Number of Children: 1  . Years of Education: N/A   Occupational History  . Physician    Social History Main Topics  . Smoking status: Never Smoker   . Smokeless tobacco: Never Used  . Alcohol Use: 9.0 oz/week    15 Glasses of wine per week  . Drug Use: No  . Sexual Activity: None   Other Topics Concern  . 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 acetaminophen (TYLENOL) 500 MG tablet Take 500 mg by mouth every 6 (six) hours as needed.        Marland Kitchen aluminum hydroxide-magnesium carbonate (GAVISCON) 31.7-137 MG/5ML suspension Take by mouth every 6 (six) hours as needed.        Marland Kitchen  amLODipine (NORVASC) 5 MG tablet Take 1 tablet (5 mg total) by mouth daily.  90 tablet  2  . aspirin 81 MG tablet Take 81 mg by mouth daily.        Marland Kitchen atorvastatin (LIPITOR) 20 MG tablet Take 1 tablet (20 mg total) by mouth daily.  90 tablet  2  . Azelaic Acid (FINACEA) 15 % cream Apply topically 2 (two) times daily. After skin is thoroughly washed and patted dry, gently but thoroughly massage a thin film of azelaic acid cream into the affected area twice daily, in the morning and evening.       . carvedilol (COREG) 3.125 MG tablet Take 1 tablet (3.125 mg total) by mouth 2 (two) times daily with a meal.  180 tablet  2  . celecoxib (CELEBREX) 200 MG capsule Take 1 capsule (200 mg total) by mouth daily.  90 capsule  1  . Cholecalciferol (VITAMIN D) 2000 UNITS CAPS Take by mouth.        . DULoxetine (CYMBALTA) 60 MG capsule Take 1 capsule (60 mg total) by mouth daily.  90 capsule  3  . estradiol  (VIVELLE-DOT) 0.05 MG/24HR Place 1 patch (0.05 mg total) onto the skin once a week. Can use half .025 or as per GYNE  8 patch    . fexofenadine (ALLEGRA) 180 MG tablet Take 180 mg by mouth daily.      Marland Kitchen HYDROcodone-acetaminophen (NORCO/VICODIN) 5-325 MG per tablet       . LORazepam (ATIVAN) 1 MG tablet Take 1 mg by mouth. Pre procedure       . metroNIDAZOLE (METROGEL) 0.75 % gel       . MULTIPLE VITAMIN PO Take by mouth.        . nitroGLYCERIN (NITROSTAT) 0.4 MG SL tablet Place 0.4 mg under the tongue every 5 (five) minutes as needed.        . NON FORMULARY C-Pap 11 Advance       . omeprazole-sodium bicarbonate (ZEGERID) 40-1100 MG per capsule Take 1 capsule by mouth every morning before breakfast.        . ramipril (ALTACE) 10 MG tablet Take 1 tablet (10 mg total) by mouth daily.  90 tablet  2  . traMADol (ULTRAM) 50 MG tablet Take 50 mg by mouth every 6 (six) hours as needed.        . valACYclovir (VALTREX) 1000 MG tablet Take 1 tablet (1,000 mg total) by mouth 2 (two) times daily.  90 tablet  0  . [DISCONTINUED] azithromycin (ZITHROMAX Z-PAK) 250 MG tablet Take 1 tablet (250 mg total) by mouth as directed. Take 2 po first day, then 1 po qd  6 each  0  . [DISCONTINUED] hydrocortisone-pramoxine (ANALPRAM-HC) 2.5-1 % rectal cream Apply to rectum 2-3 times daily as needed.  30 g  1   No facility-administered encounter medications on file as of 09/24/2013.    EXAM:  BP 118/70  Pulse 78  Temp(Src) 98.1 F (36.7 C) (Oral)  Wt 242 lb (109.77 kg)  BMI 38.48 kg/m2  SpO2 98%  Body mass index is 38.48 kg/(m^2).  GENERAL: vitals reviewed and listed above, alert, oriented, appears well hydrated and in no acute distress les hoarse walks with a limp and a cane. HEENT: atraumatic, conjunctiva  clear, no obvious abnormalities on inspection of external nose and ears OP : no lesion edema or exudate  NECK: no obvious masses on inspection palpation  noadenopathy LUNGS: clear to auscultation  bilaterally, no wheezes,  rales or rhonchi, good air movement CV: HRRR, no clubbing cyanosis or  peripheral edema nl cap refill  Abdomen soft without organomegaly guarding or rebound MS: moves all extremities no focal weakness Intal gait favoring left hip PSYCH: pleasant and cooperative, no obvious depression or anxiety Skin no acute bleeding or bruising. ASSESSMENT AND PLAN:  Discussed the following assessment and plan:  Osteoarthritis of left hip  Pre-op evaluation  Obstructive sleep apnea (adult) (pediatric)  RAYNAUD'S SYNDROME, HX OF No high-risk history or medical contraindication to surgery  . We'll proceed with surgery and send a note to Dr. Devoria Albe  office. -Patient advised to return or notify health care team  if symptoms worsen or persist or new concerns arise.  Patient Instructions  Will sent note to  [facility] about proceeding with surgery.  Can get tetanus  Booster in the future  At any time.    Neta Mends. Nitesh Pitstick M.D.

## 2013-10-02 NOTE — Progress Notes (Signed)
Need orders in EPIC.  Surgery scheduled for 10/21/13.  Thank You.  

## 2013-10-03 ENCOUNTER — Other Ambulatory Visit: Payer: Self-pay | Admitting: Orthopedic Surgery

## 2013-10-10 ENCOUNTER — Encounter (HOSPITAL_COMMUNITY): Payer: Self-pay | Admitting: Pharmacy Technician

## 2013-10-15 ENCOUNTER — Encounter (HOSPITAL_COMMUNITY): Payer: Self-pay

## 2013-10-15 ENCOUNTER — Other Ambulatory Visit (HOSPITAL_COMMUNITY): Payer: Self-pay | Admitting: *Deleted

## 2013-10-15 ENCOUNTER — Encounter (HOSPITAL_COMMUNITY)
Admission: RE | Admit: 2013-10-15 | Discharge: 2013-10-15 | Disposition: A | Discharge status: Home / Self Care | Payer: Medicare Other | Source: Ambulatory Visit | Attending: Orthopedic Surgery | Admitting: Orthopedic Surgery

## 2013-10-15 ENCOUNTER — Ambulatory Visit (HOSPITAL_COMMUNITY)
Admission: RE | Admit: 2013-10-15 | Discharge: 2013-10-15 | Disposition: A | Discharge status: Home / Self Care | Payer: Medicare Other | Source: Ambulatory Visit | Attending: Orthopedic Surgery | Admitting: Orthopedic Surgery

## 2013-10-15 DIAGNOSIS — Z01812 Encounter for preprocedural laboratory examination: Secondary | ICD-10-CM | POA: Insufficient documentation

## 2013-10-15 DIAGNOSIS — M169 Osteoarthritis of hip, unspecified: Secondary | ICD-10-CM | POA: Insufficient documentation

## 2013-10-15 DIAGNOSIS — M1610 Unilateral primary osteoarthritis, unspecified hip: Secondary | ICD-10-CM | POA: Insufficient documentation

## 2013-10-15 HISTORY — DX: Adverse effect of unspecified anesthetic, initial encounter: T41.45XA

## 2013-10-15 HISTORY — DX: Angina pectoris, unspecified: I20.9

## 2013-10-15 LAB — COMPREHENSIVE METABOLIC PANEL
ALT: 31 U/L (ref 0–35)
AST: 41 U/L — ABNORMAL HIGH (ref 0–37)
Albumin: 3.9 g/dL (ref 3.5–5.2)
Alkaline Phosphatase: 100 U/L (ref 39–117)
BUN: 21 mg/dL (ref 6–23)
CO2: 27 mEq/L (ref 19–32)
Calcium: 9.5 mg/dL (ref 8.4–10.5)
Chloride: 102 mEq/L (ref 96–112)
Creatinine, Ser: 0.69 mg/dL (ref 0.50–1.10)
GFR calc Af Amer: >90 mL/min (ref >90)
GFR calc non Af Amer: 87 mL/min — ABNORMAL LOW (ref >90)
Glucose, Bld: 109 mg/dL — ABNORMAL HIGH (ref 70–99)
Potassium: 4.6 mEq/L (ref 3.5–5.1)
Sodium: 136 mEq/L (ref 135–145)
Total Bilirubin: 0.4 mg/dL (ref 0.3–1.2)
Total Protein: 6.8 g/dL (ref 6.0–8.3)

## 2013-10-15 LAB — URINALYSIS, ROUTINE W REFLEX MICROSCOPIC
Bilirubin Urine: NEGATIVE
Glucose, UA: NEGATIVE mg/dL
Hgb urine dipstick: NEGATIVE
Ketones, ur: NEGATIVE mg/dL
Leukocytes, UA: NEGATIVE
Nitrite: NEGATIVE
Protein, ur: NEGATIVE mg/dL
Specific Gravity, Urine: 1.011 (ref 1.005–1.030)
Urobilinogen, UA: 0.2 mg/dL (ref 0.0–1.0)
pH: 7.5 (ref 5.0–8.0)

## 2013-10-15 LAB — CBC
HCT: 37 % (ref 36.0–46.0)
Hemoglobin: 12.7 g/dL (ref 12.0–15.0)
MCH: 32.4 pg (ref 26.0–34.0)
MCHC: 34.3 g/dL (ref 30.0–36.0)
MCV: 94.4 fL (ref 78.0–100.0)
Platelets: 202 10*3/uL (ref 150–400)
RBC: 3.92 MIL/uL (ref 3.87–5.11)
RDW: 13.7 % (ref 11.5–15.5)
WBC: 3.4 10*3/uL — ABNORMAL LOW (ref 4.0–10.5)

## 2013-10-15 LAB — PROTIME-INR
INR: 0.96 (ref 0.00–1.49)
Prothrombin Time: 12.6 seconds (ref 11.6–15.2)

## 2013-10-15 LAB — APTT: aPTT: 29 seconds (ref 24–37)

## 2013-10-15 LAB — SURGICAL PCR SCREEN
MRSA, PCR: NEGATIVE
Staphylococcus aureus: NEGATIVE

## 2013-10-15 NOTE — Patient Instructions (Signed)
20      Your procedure is scheduled on:  Monday 09/20/2013  Report to [facility] at  [phone] AM.  Call this number if you have problems the night before or morning of surgery: [phone]   Remember:             IF YOU USE CPAP,BRING MASK AND TUBING AM OF SURGERY!   Do not eat food or drink liquids AFTER MIDNIGHT!  Take these medicines the morning of surgery with A SIP OF WATER: Zegrid, Carvedilol   Do not bring valuables to the hospital. [facility] IS NOT RESPONSIBLE FOR ANY BELONGINGS OR VALUABLES BROUGHT TO HOSPITAL.  Marland Kitchen  Leave suitcase in the car. After surgery it may be brought to your room.  For patients admitted to the hospital, checkout time is 11:00 AM the day of              Discharge.    DO NOT WEAR JEWELRY , MAKE-UP, LOTIONS,POWDERS,PERFUMES!             WOMEN -DO NOT SHAVE LEGS OR UNDERARMS 12 HRS. BEFORE  SURGERY!               MEN MAY SHAVE AS USUAL!             CONTACTS,DENTURES OR BRIDGEWORK, FALSE EYELASHES MAY NOT BE WORN INTO SURGERY!                                           Patients discharged the day of surgery will not be allowed to drive home.  If going home the same day of surgery, must have someone stay with you first 24 hrs.at home and arrange for someone to drive you home from the              Hospital.              YOUR DRIVER IS: N/A   Special Instructions:             Please read over the following fact sheets that you were given:             1.  PREPARING FOR SURGERY SHEET              2.INCENTIVE SPIROMETRY                                        [place].Keundra Petrucelli,RN,BSN     [phone]                FAILURE TO FOLLOW THESE INSTRUCTIONS MAY RESULT IN  CANCELLATION OF YOUR SURGERY!               Patient Signature:___________________________

## 2013-10-15 NOTE — H&P (Signed)
TOTAL HIP ADMISSION H&P  Patient is admitted for left total hip arthroplasty.  Subjective:  Chief Complaint: left hip pain  HPI: Terri Rodriguez, 67 y.o. female, has a history of pain and functional disability in the left hip(s) due to arthritis and patient has failed non-surgical conservative treatments for greater than 12 weeks to include NSAID's and/or analgesics, corticosteriod injections, use of assistive devices and activity modification.  Onset of symptoms was abrupt starting 6 months ago with rapidlly worsening course since that time.The patient noted no past surgery on the left hip(s).  Patient currently rates pain in the left hip at 9 out of 10 with activity. Patient has night pain, worsening of pain with activity and weight bearing, pain that interfers with activities of daily living, pain with passive range of motion and crepitus. Patient has evidence of periarticular osteophytes and joint space narrowing by imaging studies. This condition presents safety issues increasing the risk of falls.  There is no current active infection.  Patient Active Problem List   Diagnosis Date Noted  . Osteoarthritis of left hip 09/27/2013  . Pre-op evaluation 09/27/2013  . Acute bronchitis 09/16/2013  . Medication monitoring encounter 09/22/2012  . Hx of cardiac catheterization   . Postmenopausal HRT (hormone replacement therapy) 09/20/2012  . Hx of adenomatous polyp of colon   . Adenomatous polyp of colon 06/14/2012  . Hx of fall 06/14/2012  . ROSACEA 04/26/2010  . SLEEP APNEA, OBSTRUCTIVE 09/09/2009  . FOOT PAIN, LEFT 05/01/2008  . LEUKOPENIA, CHRONIC 01/08/2008  . RAYNAUD'S SYNDROME, HX OF 01/08/2008  . EPICONDYLITIS, LATERAL 09/04/2007  . HYPERLIPIDEMIA 08/17/2007  . ALLERGIC RHINITIS 08/17/2007  . GERD 08/17/2007  . OSTEOARTHRITIS 08/17/2007   Past Medical History  Diagnosis Date  . HYPERLIPIDEMIA   . UNSPECIFIED ANEMIA   . LEUKOPENIA, CHRONIC   . SLEEP APNEA, OBSTRUCTIVE   .  ALLERGIC RHINITIS   . GERD   . Rosacea   . EPICONDYLITIS, LATERAL   . RAYNAUD'S SYNDROME, HX OF   . Hx of adenomatous polyp of colon 06/05/08  . Hx of cardiac catheterization 2008     clean coronarys DrKelly   . Hx of chest pain     neg cath remot hx of narrowwing lad in 2003 nl 2008  . Nasal injury 06/14/2012  . Diverticulitis   . Hepatic hemangioma 06/09/08  . Mononucleosis 1963  . Subacute thyroiditis   . IBS (irritable bowel syndrome)   . Hiatal hernia 03/2000  . Cholelithiasis 06/09/08    Past Surgical History  Procedure Laterality Date  . Abdominal hysterectomy    . Tonsillectomy    . Cesarean section  1985, 1989  . Dilation and curettage of uterus    . Nasal septoplasty w/ turbinoplasty    . Lumbar surgery fixation with disc replacement  10/08    Left  . Knee arthroscopy      bilateral  . Replacement total knee      bilateral    Current outpatient prescriptions: acetaminophen (TYLENOL) 500 MG tablet, Take 500 mg by mouth every 6 (six) hours as needed for mild pain or headache. , Disp: , Rfl: ;   aluminum hydroxide-magnesium carbonate (GAVISCON) 31.7-137 MG/5ML suspension, Take 10 mLs by mouth every 6 (six) hours as needed for indigestion. , Disp: , Rfl: ;   amLODipine (NORVASC) 5 MG tablet, Take 5 mg by mouth every evening., Disp: , Rfl:  aspirin 81 MG tablet, Take 81 mg by mouth daily.  , Disp: , Rfl: ;  atorvastatin (LIPITOR) 20 MG tablet, Take 20 mg by mouth every evening., Disp: , Rfl: ;   Azelaic Acid (FINACEA) 15 % cream, Apply topically 2 (two) times daily. After skin is thoroughly washed and patted dry, gently but thoroughly massage a thin film of azelaic acid cream into the affected area twice daily, in the morning and evening. , Disp: , Rfl:  carvedilol (COREG) 3.125 MG tablet, Take 3.125 mg by mouth 2 (two) times daily with a meal., Disp: , Rfl: ;   celecoxib (CELEBREX) 200 MG capsule, Take 1 capsule (200 mg total) by mouth daily., Disp: 90 capsule, Rfl: 1;    Cholecalciferol (VITAMIN D) 2000 UNITS CAPS, Take 1 capsule by mouth daily. , Disp: , Rfl: ;   DULoxetine (CYMBALTA) 60 MG capsule, Take 60 mg by mouth every evening., Disp: , Rfl:  estradiol (VIVELLE-DOT) 0.05 MG/24HR, Place 1 patch onto the skin as directed. Every 4 days, Disp: 8 patch, Rfl: ;   fexofenadine (ALLEGRA) 180 MG tablet, Take 180 mg by mouth daily., Disp: , Rfl: ;   HYDROcodone-acetaminophen (NORCO/VICODIN) 5-325 MG per tablet, Take 2 tablets by mouth at bedtime. , Disp: , Rfl: ;   metroNIDAZOLE (METROGEL) 1 % gel, Apply 1 application topically daily. Apply to face, Disp: , Rfl:  Multiple Vitamin (MULTIVITAMIN WITH MINERALS) TABS tablet, Take 1 tablet by mouth daily., Disp: , Rfl: ;   nitroGLYCERIN (NITROSTAT) 0.4 MG SL tablet, Place 0.4 mg under the tongue every 5 (five) minutes as needed for chest pain. , Disp: , Rfl: ;   omeprazole-sodium bicarbonate (ZEGERID) 40-1100 MG per capsule, Take 1 capsule by mouth every morning before breakfast.  , Disp: , Rfl:  ramipril (ALTACE) 10 MG capsule, Take 10 mg by mouth every evening., Disp: , Rfl: ;   valACYclovir (VALTREX) 1000 MG tablet, Take 1,000 mg by mouth 2 (two) times daily. For outbreaks, Disp: , Rfl:    Allergies  Allergen Reactions  . Crab [Shellfish Allergy] Itching and Nausea And Vomiting    History  Substance Use Topics  . Smoking status: Never Smoker   . Smokeless tobacco: Never Used  . Alcohol Use: 9.0 oz/week    15 Glasses of wine per week    Family History  Problem Relation Age of Onset  . Rheum arthritis Mother   . Diabetes Mother   . Heart failure Mother   . Thrombocytopenia Mother   . Sleep apnea Mother   . Deep vein thrombosis Father   . Pulmonary embolism Father   . Osteoarthritis Father   . Atrial fibrillation Father   . Dementia Father   . Sleep apnea Father   . Sleep apnea Brother   . Obesity Brother   . Sleep apnea Brother   . Obesity Brother   . Ulcers Mother     PUD  . Colon cancer  Maternal Aunt     x 2  . Pancreatic cancer    . Uterine cancer    . Leukemia    . Inflammatory bowel disease      aunt  . Congenital adrenal hyperplasia Grandchild      Review of Systems  Constitutional: Negative.   HENT: Positive for hearing loss and tinnitus. Negative for congestion, ear discharge, ear pain, nosebleeds and sore throat.   Eyes: Negative.   Respiratory: Negative.  Negative for stridor.   Cardiovascular: Negative.   Gastrointestinal: Negative.   Genitourinary: Negative.   Musculoskeletal: Positive for joint pain. Negative for back pain, falls, myalgias  and neck pain.       Left hip pain  Skin: Negative.   Neurological: Negative.  Negative for headaches.  Endo/Heme/Allergies: Negative.   Psychiatric/Behavioral: Negative.     Objective:  Physical Exam  Constitutional: She is oriented to person, place, and time. She appears well-developed and well-nourished. No distress.  HENT:  Head: Normocephalic and atraumatic.  Right Ear: External ear normal.  Left Ear: External ear normal.  Nose: Nose normal.  Mouth/Throat: Oropharynx is clear and moist.  Eyes: Conjunctivae and EOM are normal.  Neck: Normal range of motion.  Cardiovascular: Normal rate, regular rhythm, normal heart sounds and intact distal pulses.   No murmur heard. Respiratory: Effort normal and breath sounds normal. No respiratory distress. She has no wheezes.  GI: Soft. Bowel sounds are normal.  Musculoskeletal:       Right hip: Normal.       Left hip: She exhibits decreased range of motion, decreased strength and crepitus.       Right knee: Normal.       Left knee: Normal.       Right lower leg: She exhibits no tenderness and no swelling.       Left lower leg: She exhibits no tenderness and no swelling.  Her left hip can be flexed 90 with no internal rotation, about 20 external rotation, 20 abduction. Right hip has normal range of motion.  Neurological: She is alert and oriented to person,  place, and time. She has normal strength and normal reflexes. No sensory deficit.  Skin: No rash noted. She is not diaphoretic. No erythema.  Psychiatric: She has a normal mood and affect. Her behavior is normal.    Vitals Weight: 235 lb Height: 66 in Body Surface Area: 2.23 m Body Mass Index: 37.93 kg/m Pulse: 78 (Regular) BP: 131/67 (Sitting, Left Arm, Standard)   Imaging Review Plain radiographs demonstrate severe degenerative joint disease of the left hip(s). The bone quality appears to be good for age and reported activity level.  Assessment/Plan:  End stage arthritis, left hip(s)  The patient history, physical examination, clinical judgement of the provider and imaging studies are consistent with end stage degenerative joint disease of the left hip(s) and total hip arthroplasty is deemed medically necessary. The treatment options including medical management, injection therapy, arthroscopy and arthroplasty were discussed at length. The risks and benefits of total hip arthroplasty were presented and reviewed. The risks due to aseptic loosening, infection, stiffness, dislocation/subluxation,  thromboembolic complications and other imponderables were discussed.  The patient acknowledged the explanation, agreed to proceed with the plan and consent was signed. Patient is being admitted for inpatient treatment for surgery, pain control, PT, OT, prophylactic antibiotics, VTE prophylaxis, progressive ambulation and ADL's and discharge planning.The patient is planning to be discharged home with home health services but has stairs to her bedroom and therefore would be open to SNF if she does not progress well with PT in the hospital.     Dimitri Ped, PA-C

## 2013-10-15 NOTE — Progress Notes (Signed)
EKG from [facility] 01/15/2013 on chart. Office note from [facility] 01/15/2013 on chart. Adult Echocardiography from [facility] 11/09/2007 on chart.  Rest/ [place] [facility] from [facility] 11/09/2007 on chart.  Pre-operative clearance from Dr. Tresa Endo 08/07/2013 on chart.

## 2013-10-21 ENCOUNTER — Encounter (HOSPITAL_COMMUNITY): Payer: Self-pay | Admitting: *Deleted

## 2013-10-21 ENCOUNTER — Inpatient Hospital Stay (HOSPITAL_COMMUNITY): Payer: Medicare Other | Admitting: Certified Registered Nurse Anesthetist

## 2013-10-21 ENCOUNTER — Encounter (HOSPITAL_COMMUNITY): Payer: Medicare Other | Admitting: Certified Registered Nurse Anesthetist

## 2013-10-21 ENCOUNTER — Inpatient Hospital Stay (HOSPITAL_COMMUNITY): Payer: Medicare Other

## 2013-10-21 ENCOUNTER — Encounter (HOSPITAL_COMMUNITY)
Admission: RE | Disposition: A | Discharge status: Home Health | Payer: Self-pay | Source: Ambulatory Visit | Attending: Orthopedic Surgery

## 2013-10-21 ENCOUNTER — Inpatient Hospital Stay (HOSPITAL_COMMUNITY)
Admission: RE | Admit: 2013-10-21 | Discharge: 2013-10-23 | DRG: 470 | Disposition: A | Discharge status: Home Health | Payer: Medicare Other | Source: Ambulatory Visit | Attending: Orthopedic Surgery | Admitting: Orthopedic Surgery

## 2013-10-21 DIAGNOSIS — Z96649 Presence of unspecified artificial hip joint: Secondary | ICD-10-CM

## 2013-10-21 DIAGNOSIS — I7300 Raynaud's syndrome without gangrene: Secondary | ICD-10-CM | POA: Diagnosis present

## 2013-10-21 DIAGNOSIS — L719 Rosacea, unspecified: Secondary | ICD-10-CM | POA: Diagnosis present

## 2013-10-21 DIAGNOSIS — Z79899 Other long term (current) drug therapy: Secondary | ICD-10-CM

## 2013-10-21 DIAGNOSIS — K219 Gastro-esophageal reflux disease without esophagitis: Secondary | ICD-10-CM | POA: Diagnosis present

## 2013-10-21 DIAGNOSIS — Z91013 Allergy to seafood: Secondary | ICD-10-CM

## 2013-10-21 DIAGNOSIS — M1610 Unilateral primary osteoarthritis, unspecified hip: Principal | ICD-10-CM | POA: Diagnosis present

## 2013-10-21 DIAGNOSIS — E785 Hyperlipidemia, unspecified: Secondary | ICD-10-CM | POA: Diagnosis present

## 2013-10-21 DIAGNOSIS — Z7982 Long term (current) use of aspirin: Secondary | ICD-10-CM

## 2013-10-21 DIAGNOSIS — K589 Irritable bowel syndrome without diarrhea: Secondary | ICD-10-CM | POA: Diagnosis present

## 2013-10-21 DIAGNOSIS — I10 Essential (primary) hypertension: Secondary | ICD-10-CM | POA: Diagnosis present

## 2013-10-21 DIAGNOSIS — Z79890 Hormone replacement therapy (postmenopausal): Secondary | ICD-10-CM

## 2013-10-21 DIAGNOSIS — G4733 Obstructive sleep apnea (adult) (pediatric): Secondary | ICD-10-CM | POA: Diagnosis present

## 2013-10-21 DIAGNOSIS — Z6839 Body mass index (BMI) 39.0-39.9, adult: Secondary | ICD-10-CM

## 2013-10-21 DIAGNOSIS — D62 Acute posthemorrhagic anemia: Secondary | ICD-10-CM | POA: Diagnosis not present

## 2013-10-21 DIAGNOSIS — M169 Osteoarthritis of hip, unspecified: Secondary | ICD-10-CM | POA: Diagnosis present

## 2013-10-21 DIAGNOSIS — K449 Diaphragmatic hernia without obstruction or gangrene: Secondary | ICD-10-CM | POA: Diagnosis present

## 2013-10-21 HISTORY — PX: TOTAL HIP ARTHROPLASTY: SHX124

## 2013-10-21 LAB — TYPE AND SCREEN
ABO/RH(D): O NEG
Antibody Screen: NEGATIVE

## 2013-10-21 SURGERY — ARTHROPLASTY, HIP, TOTAL, ANTERIOR APPROACH
Anesthesia: General | Site: Hip | Laterality: Left | Wound class: Clean

## 2013-10-21 MED ORDER — 0.9 % SODIUM CHLORIDE (POUR BTL) OPTIME
TOPICAL | Status: DC | PRN
  Administered 2013-10-21: 1000 mL

## 2013-10-21 MED ORDER — PHENYLEPHRINE HCL 10 MG/ML IJ SOLN
INTRAMUSCULAR | Status: DC | PRN
  Administered 2013-10-21 (×4): 80 ug via INTRAVENOUS

## 2013-10-21 MED ORDER — POLYETHYLENE GLYCOL 3350 17 G PO PACK: 17.0000 g | PACK | Freq: Every day | ORAL | Status: DC | PRN

## 2013-10-21 MED ORDER — SODIUM CHLORIDE 0.9 % IJ SOLN
INTRAMUSCULAR | Status: AC
  Filled 2013-10-21: qty 50

## 2013-10-21 MED ORDER — ONDANSETRON HCL 4 MG/2ML IJ SOLN: 4.0000 mg | Freq: Four times a day (QID) | INTRAMUSCULAR | Status: DC | PRN

## 2013-10-21 MED ORDER — METOCLOPRAMIDE HCL 5 MG/ML IJ SOLN: 5.0000 mg | Freq: Three times a day (TID) | INTRAMUSCULAR | Status: DC | PRN

## 2013-10-21 MED ORDER — MENTHOL 3 MG MT LOZG
1.0000 | LOZENGE | OROMUCOSAL | Status: DC | PRN
  Filled 2013-10-21: qty 9

## 2013-10-21 MED ORDER — SODIUM CHLORIDE 0.9 % IJ SOLN
INTRAMUSCULAR | Status: DC | PRN
  Administered 2013-10-21: 14:00:00

## 2013-10-21 MED ORDER — METRONIDAZOLE 0.75 % EX GEL
1.0000 "application " | Freq: Every day | CUTANEOUS | Status: DC
  Administered 2013-10-22 – 2013-10-23 (×2): 1 via TOPICAL
  Filled 2013-10-21: qty 45

## 2013-10-21 MED ORDER — KETOROLAC TROMETHAMINE 15 MG/ML IJ SOLN
7.5000 mg | Freq: Four times a day (QID) | INTRAMUSCULAR | Status: AC | PRN
  Administered 2013-10-21: 7.5 mg via INTRAVENOUS

## 2013-10-21 MED ORDER — METHOCARBAMOL 100 MG/ML IJ SOLN
500.0000 mg | Freq: Four times a day (QID) | INTRAVENOUS | Status: DC | PRN
  Administered 2013-10-21: 500 mg via INTRAVENOUS
  Filled 2013-10-21 (×2): qty 5

## 2013-10-21 MED ORDER — DEXAMETHASONE SODIUM PHOSPHATE 10 MG/ML IJ SOLN
INTRAMUSCULAR | Status: DC | PRN
  Administered 2013-10-21: 10 mg via INTRAVENOUS

## 2013-10-21 MED ORDER — DEXAMETHASONE 6 MG PO TABS
10.0000 mg | ORAL_TABLET | Freq: Every day | ORAL | Status: AC
  Administered 2013-10-22: 10 mg via ORAL
  Filled 2013-10-21: qty 1

## 2013-10-21 MED ORDER — SODIUM CHLORIDE 0.9 % IV SOLN: INTRAVENOUS | Status: DC

## 2013-10-21 MED ORDER — DEXAMETHASONE SODIUM PHOSPHATE 10 MG/ML IJ SOLN
10.0000 mg | Freq: Every day | INTRAMUSCULAR | Status: AC
  Filled 2013-10-21: qty 1

## 2013-10-21 MED ORDER — BISACODYL 10 MG RE SUPP: 10.0000 mg | Freq: Every day | RECTAL | Status: DC | PRN

## 2013-10-21 MED ORDER — DOCUSATE SODIUM 100 MG PO CAPS
100.0000 mg | ORAL_CAPSULE | Freq: Two times a day (BID) | ORAL | Status: DC
  Administered 2013-10-21 – 2013-10-23 (×3): 100 mg via ORAL

## 2013-10-21 MED ORDER — ONDANSETRON HCL 4 MG PO TABS: 4.0000 mg | ORAL_TABLET | Freq: Four times a day (QID) | ORAL | Status: DC | PRN

## 2013-10-21 MED ORDER — PHENOL 1.4 % MT LIQD
1.0000 | OROMUCOSAL | Status: DC | PRN
  Filled 2013-10-21: qty 177

## 2013-10-21 MED ORDER — ATORVASTATIN CALCIUM 20 MG PO TABS
20.0000 mg | ORAL_TABLET | Freq: Every evening | ORAL | Status: DC
  Administered 2013-10-21 – 2013-10-22 (×2): 20 mg via ORAL
  Filled 2013-10-21 (×3): qty 1

## 2013-10-21 MED ORDER — PANTOPRAZOLE SODIUM 40 MG PO PACK
80.0000 mg | PACK | Freq: Every day | ORAL | Status: DC
  Filled 2013-10-21: qty 40

## 2013-10-21 MED ORDER — GLYCOPYRROLATE 0.2 MG/ML IJ SOLN
INTRAMUSCULAR | Status: DC | PRN
  Administered 2013-10-21: .8 mg via INTRAVENOUS

## 2013-10-21 MED ORDER — MORPHINE SULFATE 2 MG/ML IJ SOLN: 1.0000 mg | INTRAMUSCULAR | Status: DC | PRN

## 2013-10-21 MED ORDER — CARVEDILOL 3.125 MG PO TABS
3.1250 mg | ORAL_TABLET | Freq: Two times a day (BID) | ORAL | Status: DC
  Administered 2013-10-21 – 2013-10-23 (×4): 3.125 mg via ORAL
  Filled 2013-10-21 (×6): qty 1

## 2013-10-21 MED ORDER — CEFAZOLIN SODIUM-DEXTROSE 2-3 GM-% IV SOLR
INTRAVENOUS | Status: AC
  Filled 2013-10-21: qty 50

## 2013-10-21 MED ORDER — METHOCARBAMOL 500 MG PO TABS
500.0000 mg | ORAL_TABLET | Freq: Four times a day (QID) | ORAL | Status: DC | PRN
  Administered 2013-10-22 – 2013-10-23 (×3): 500 mg via ORAL
  Filled 2013-10-21 (×3): qty 1

## 2013-10-21 MED ORDER — ONDANSETRON HCL 4 MG/2ML IJ SOLN
INTRAMUSCULAR | Status: DC | PRN
  Administered 2013-10-21: 4 mg via INTRAVENOUS

## 2013-10-21 MED ORDER — AMLODIPINE BESYLATE 5 MG PO TABS
5.0000 mg | ORAL_TABLET | Freq: Every evening | ORAL | Status: DC
  Administered 2013-10-21 – 2013-10-22 (×2): 5 mg via ORAL
  Filled 2013-10-21 (×3): qty 1

## 2013-10-21 MED ORDER — ACETAMINOPHEN 10 MG/ML IV SOLN
1000.0000 mg | Freq: Once | INTRAVENOUS | Status: DC
  Filled 2013-10-21 (×2): qty 100

## 2013-10-21 MED ORDER — BUPIVACAINE HCL (PF) 0.25 % IJ SOLN
INTRAMUSCULAR | Status: AC
  Filled 2013-10-21: qty 30

## 2013-10-21 MED ORDER — SODIUM CHLORIDE 0.9 % IV SOLN
INTRAVENOUS | Status: DC
  Administered 2013-10-21 – 2013-10-22 (×2): via INTRAVENOUS

## 2013-10-21 MED ORDER — BUPIVACAINE HCL 0.25 % IJ SOLN
INTRAMUSCULAR | Status: DC | PRN
  Administered 2013-10-21: 20 mL

## 2013-10-21 MED ORDER — PANTOPRAZOLE SODIUM 40 MG PO TBEC: 80.0000 mg | DELAYED_RELEASE_TABLET | Freq: Every day | ORAL | Status: DC

## 2013-10-21 MED ORDER — DEXAMETHASONE SODIUM PHOSPHATE 10 MG/ML IJ SOLN: 10.0000 mg | Freq: Once | INTRAMUSCULAR | Status: DC

## 2013-10-21 MED ORDER — ROCURONIUM BROMIDE 100 MG/10ML IV SOLN
INTRAVENOUS | Status: DC | PRN
  Administered 2013-10-21: 50 mg via INTRAVENOUS
  Administered 2013-10-21 (×4): 5 mg via INTRAVENOUS

## 2013-10-21 MED ORDER — OXYCODONE HCL 5 MG PO TABS
5.0000 mg | ORAL_TABLET | ORAL | Status: DC | PRN
  Administered 2013-10-21: 5 mg via ORAL
  Administered 2013-10-22: 14:00:00 10 mg via ORAL
  Administered 2013-10-22 (×2): 5 mg via ORAL
  Administered 2013-10-23 (×4): 10 mg via ORAL
  Filled 2013-10-21 (×2): qty 2
  Filled 2013-10-21 (×2): qty 1
  Filled 2013-10-21 (×2): qty 2
  Filled 2013-10-21: qty 1
  Filled 2013-10-21: qty 2

## 2013-10-21 MED ORDER — FENTANYL CITRATE 0.05 MG/ML IJ SOLN
INTRAMUSCULAR | Status: DC | PRN
  Administered 2013-10-21 (×4): 50 ug via INTRAVENOUS
  Administered 2013-10-21: 100 ug via INTRAVENOUS
  Administered 2013-10-21 (×2): 50 ug via INTRAVENOUS

## 2013-10-21 MED ORDER — BUPIVACAINE LIPOSOME 1.3 % IJ SUSP
20.0000 mL | Freq: Once | INTRAMUSCULAR | Status: DC
  Filled 2013-10-21: qty 20

## 2013-10-21 MED ORDER — RIVAROXABAN 10 MG PO TABS
10.0000 mg | ORAL_TABLET | Freq: Every day | ORAL | Status: DC
  Administered 2013-10-22 – 2013-10-23 (×2): 10 mg via ORAL
  Filled 2013-10-21 (×3): qty 1

## 2013-10-21 MED ORDER — LIDOCAINE HCL (CARDIAC) 20 MG/ML IV SOLN
INTRAVENOUS | Status: DC | PRN
  Administered 2013-10-21: 100 mg via INTRAVENOUS

## 2013-10-21 MED ORDER — DULOXETINE HCL 60 MG PO CPEP
60.0000 mg | ORAL_CAPSULE | Freq: Every evening | ORAL | Status: DC
  Administered 2013-10-21 – 2013-10-22 (×2): 60 mg via ORAL
  Filled 2013-10-21 (×3): qty 1

## 2013-10-21 MED ORDER — KETOROLAC TROMETHAMINE 15 MG/ML IJ SOLN
INTRAMUSCULAR | Status: AC
  Filled 2013-10-21: qty 1

## 2013-10-21 MED ORDER — SUCCINYLCHOLINE CHLORIDE 20 MG/ML IJ SOLN
INTRAMUSCULAR | Status: DC | PRN
  Administered 2013-10-21: 120 mg via INTRAVENOUS

## 2013-10-21 MED ORDER — LORATADINE 10 MG PO TABS
10.0000 mg | ORAL_TABLET | Freq: Every day | ORAL | Status: DC
  Administered 2013-10-21 – 2013-10-22 (×2): 10 mg via ORAL
  Filled 2013-10-21 (×3): qty 1

## 2013-10-21 MED ORDER — PROPOFOL 10 MG/ML IV BOLUS
INTRAVENOUS | Status: DC | PRN
  Administered 2013-10-21: 200 mg via INTRAVENOUS

## 2013-10-21 MED ORDER — RAMIPRIL 10 MG PO CAPS
10.0000 mg | ORAL_CAPSULE | Freq: Every day | ORAL | Status: DC
  Administered 2013-10-21 – 2013-10-23 (×3): 10 mg via ORAL
  Filled 2013-10-21 (×3): qty 1

## 2013-10-21 MED ORDER — HYDROMORPHONE HCL PF 1 MG/ML IJ SOLN
INTRAMUSCULAR | Status: AC
  Filled 2013-10-21: qty 1

## 2013-10-21 MED ORDER — METOCLOPRAMIDE HCL 10 MG PO TABS: 5.0000 mg | ORAL_TABLET | Freq: Three times a day (TID) | ORAL | Status: DC | PRN

## 2013-10-21 MED ORDER — CEFAZOLIN SODIUM-DEXTROSE 2-3 GM-% IV SOLR
2.0000 g | Freq: Four times a day (QID) | INTRAVENOUS | Status: AC
  Administered 2013-10-21 – 2013-10-22 (×2): 2 g via INTRAVENOUS
  Filled 2013-10-21 (×2): qty 50

## 2013-10-21 MED ORDER — CEFAZOLIN SODIUM-DEXTROSE 2-3 GM-% IV SOLR
2.0000 g | INTRAVENOUS | Status: AC
  Administered 2013-10-21: 2 g via INTRAVENOUS

## 2013-10-21 MED ORDER — ACETAMINOPHEN 650 MG RE SUPP: 650.0000 mg | Freq: Four times a day (QID) | RECTAL | Status: DC | PRN

## 2013-10-21 MED ORDER — FLEET ENEMA 7-19 GM/118ML RE ENEM: 1.0000 | ENEMA | Freq: Once | RECTAL | Status: AC | PRN

## 2013-10-21 MED ORDER — NITROGLYCERIN 0.4 MG SL SUBL: 0.4000 mg | SUBLINGUAL_TABLET | SUBLINGUAL | Status: DC | PRN

## 2013-10-21 MED ORDER — LACTATED RINGERS IV SOLN
INTRAVENOUS | Status: DC
Start: 2013-10-21 — End: 2013-10-21
  Administered 2013-10-21 (×3): via INTRAVENOUS
  Administered 2013-10-21: 1000 mL via INTRAVENOUS

## 2013-10-21 MED ORDER — MIDAZOLAM HCL 5 MG/5ML IJ SOLN
INTRAMUSCULAR | Status: DC | PRN
  Administered 2013-10-21: 2 mg via INTRAVENOUS

## 2013-10-21 MED ORDER — HYDROMORPHONE HCL PF 1 MG/ML IJ SOLN
0.2500 mg | INTRAMUSCULAR | Status: DC | PRN
  Administered 2013-10-21 (×4): 0.25 mg via INTRAVENOUS

## 2013-10-21 MED ORDER — DIPHENHYDRAMINE HCL 12.5 MG/5ML PO ELIX: 12.5000 mg | ORAL_SOLUTION | ORAL | Status: DC | PRN

## 2013-10-21 MED ORDER — ACETAMINOPHEN 325 MG PO TABS: 650.0000 mg | ORAL_TABLET | Freq: Four times a day (QID) | ORAL | Status: DC | PRN

## 2013-10-21 MED ORDER — ACETAMINOPHEN 500 MG PO TABS
1000.0000 mg | ORAL_TABLET | Freq: Four times a day (QID) | ORAL | Status: AC
  Administered 2013-10-21 – 2013-10-22 (×4): 1000 mg via ORAL
  Filled 2013-10-21 (×5): qty 2

## 2013-10-21 MED ORDER — LABETALOL HCL 5 MG/ML IV SOLN
INTRAVENOUS | Status: DC | PRN
  Administered 2013-10-21 (×2): 5 mg via INTRAVENOUS

## 2013-10-21 MED ORDER — NEOSTIGMINE METHYLSULFATE 1 MG/ML IJ SOLN
INTRAMUSCULAR | Status: DC | PRN
  Administered 2013-10-21: 5 mg via INTRAVENOUS

## 2013-10-21 SURGICAL SUPPLY — 41 items
BAG SPEC THK2 15X12 ZIP CLS (MISCELLANEOUS)
BAG ZIPLOCK 12X15 (MISCELLANEOUS) ×2 IMPLANT
BLADE SAW SGTL 18X1.27X75 (BLADE) ×2 IMPLANT
CAPT HIP PF COP ×1 IMPLANT
DECANTER SPIKE VIAL GLASS SM (MISCELLANEOUS) ×2 IMPLANT
DRAPE C-ARM 42X120 X-RAY (DRAPES) ×2 IMPLANT
DRAPE STERI IOBAN 125X83 (DRAPES) ×2 IMPLANT
DRAPE U-SHAPE 47X51 STRL (DRAPES) ×6 IMPLANT
DRSG ADAPTIC 3X8 NADH LF (GAUZE/BANDAGES/DRESSINGS) ×2 IMPLANT
DRSG MEPILEX BORDER 4X4 (GAUZE/BANDAGES/DRESSINGS) ×2 IMPLANT
DRSG MEPILEX BORDER 4X8 (GAUZE/BANDAGES/DRESSINGS) ×2 IMPLANT
DURAPREP 26ML APPLICATOR (WOUND CARE) ×2 IMPLANT
ELECT BLADE 6.5 EXT (BLADE) ×2 IMPLANT
ELECT REM PT RETURN 9FT ADLT (ELECTROSURGICAL) ×2
ELECTRODE REM PT RTRN 9FT ADLT (ELECTROSURGICAL) ×1 IMPLANT
EVACUATOR 1/8 PVC DRAIN (DRAIN) ×2 IMPLANT
FACESHIELD LNG OPTICON STERILE (SAFETY) ×8 IMPLANT
GLOVE BIO SURGEON STRL SZ7.5 (GLOVE) ×2 IMPLANT
GLOVE BIO SURGEON STRL SZ8 (GLOVE) ×4 IMPLANT
GLOVE BIOGEL PI IND STRL 8 (GLOVE) ×2 IMPLANT
GLOVE BIOGEL PI INDICATOR 8 (GLOVE) ×2
GLOVE SURG SS PI 6.5 STRL IVOR (GLOVE) ×2 IMPLANT
GOWN PREVENTION PLUS LG XLONG (DISPOSABLE) ×3 IMPLANT
GOWN STRL REIN XL XLG (GOWN DISPOSABLE) ×3 IMPLANT
KIT BASIN OR (CUSTOM PROCEDURE TRAY) ×2 IMPLANT
NDL SAFETY ECLIPSE 18X1.5 (NEEDLE) ×2 IMPLANT
NEEDLE HYPO 18GX1.5 SHARP (NEEDLE) ×4
PACK TOTAL JOINT (CUSTOM PROCEDURE TRAY) ×2 IMPLANT
PADDING CAST COTTON 6X4 STRL (CAST SUPPLIES) ×2 IMPLANT
SPONGE GAUZE 4X4 12PLY (GAUZE/BANDAGES/DRESSINGS) IMPLANT
STRIP CLOSURE SKIN 1/2X4 (GAUZE/BANDAGES/DRESSINGS) ×2 IMPLANT
SUCTION FRAZIER 12FR DISP (SUCTIONS) IMPLANT
SUT ETHIBOND NAB CT1 #1 30IN (SUTURE) ×2 IMPLANT
SUT MNCRL AB 4-0 PS2 18 (SUTURE) ×2 IMPLANT
SUT VIC AB 2-0 CT1 27 (SUTURE) ×4
SUT VIC AB 2-0 CT1 TAPERPNT 27 (SUTURE) ×2 IMPLANT
SUT VLOC 180 0 24IN GS25 (SUTURE) ×2 IMPLANT
SYR 20CC LL (SYRINGE) ×2 IMPLANT
SYR 50ML LL SCALE MARK (SYRINGE) ×2 IMPLANT
TOWEL OR 17X26 10 PK STRL BLUE (TOWEL DISPOSABLE) ×2 IMPLANT
TRAY FOLEY CATH 14FRSI W/METER (CATHETERS) ×2 IMPLANT

## 2013-10-21 NOTE — Anesthesia Preprocedure Evaluation (Addendum)
Anesthesia Evaluation  Patient identified by MRN, date of birth, ID band Patient awake    Reviewed: Allergy & Precautions, H&P , NPO status , Patient's Chart, lab work & pertinent test results, reviewed documented beta blocker date and time   Airway Mallampati: III TM Distance: >3 FB Neck ROM: full    Dental  (+) Caps and Dental Advisory Given All teeth are capped:   Pulmonary sleep apnea ,  breath sounds clear to auscultation  Pulmonary exam normal       Cardiovascular hypertension, Pt. on home beta blockers and Pt. on medications + angina Rhythm:regular Rate:Normal  Neg. Cardiac cath 2008 ( history remote narrowing LAD 2003?)   Neuro/Psych Raynaud's. Back fusion negative neurological ROS  negative psych ROS   GI/Hepatic negative GI ROS, Neg liver ROS, hiatal hernia, GERD-  Medicated and Controlled,  Endo/Other  negative endocrine ROSMorbid obesity  Renal/GU negative Renal ROS  negative genitourinary   Musculoskeletal   Abdominal (+) + obese,   Peds  Hematology negative hematology ROS (+)   Anesthesia Other Findings   Reproductive/Obstetrics negative OB ROS                          Anesthesia Physical Anesthesia Plan  ASA: III  Anesthesia Plan: General   Post-op Pain Management:    Induction: Intravenous  Airway Management Planned: Oral ETT  Additional Equipment:   Intra-op Plan:   Post-operative Plan: Extubation in OR  Informed Consent: I have reviewed the patients History and Physical, chart, labs and discussed the procedure including the risks, benefits and alternatives for the proposed anesthesia with the patient or authorized representative who has indicated his/her understanding and acceptance.   Dental Advisory Given  Plan Discussed with: CRNA and Surgeon  Anesthesia Plan Comments:        Anesthesia Quick Evaluation

## 2013-10-21 NOTE — Transfer of Care (Signed)
Immediate Anesthesia Transfer of Care Note  Patient: Terri Rodriguez  Procedure(s) Performed: Procedure(s) (LRB): LEFT TOTAL HIP ARTHROPLASTY ANTERIOR APPROACH (Left)  Patient Location: PACU  Anesthesia Type: General  Level of Consciousness: sedated, patient cooperative and responds to stimulation  Airway & Oxygen Therapy: Patient Spontanous Breathing and Patient connected to face mask oxgen  Post-op Assessment: Report given to PACU RN and Post -op Vital signs reviewed and stable  Post vital signs: Reviewed and stable  Complications: No apparent anesthesia complications

## 2013-10-21 NOTE — Anesthesia Postprocedure Evaluation (Signed)
  Anesthesia Post-op Note  Patient: Terri Rodriguez  Procedure(s) Performed: Procedure(s) (LRB): LEFT TOTAL HIP ARTHROPLASTY ANTERIOR APPROACH (Left)  Patient Location: PACU  Anesthesia Type: General  Level of Consciousness: awake and alert   Airway and Oxygen Therapy: Patient Spontanous Breathing  Post-op Pain: mild  Post-op Assessment: Post-op Vital signs reviewed, Patient's Cardiovascular Status Stable, Respiratory Function Stable, Patent Airway and No signs of Nausea or vomiting  Last Vitals:  Filed Vitals:   10/21/13 1500  BP:   Pulse:   Temp: 36.8 C  Resp:     Post-op Vital Signs: stable   Complications: No apparent anesthesia complications

## 2013-10-21 NOTE — Progress Notes (Signed)
Cpap setup in room per home settings of 4-20cm h2o, autotitration mode.  Pt is using hospital-provided cpap unit with her nasal pillows from home.  RN at bedside, who will remove [place]/etco2 monitor device and place 2lO2 bleedin through cpap when pt is ready for bed.  Sterile water added to humidity chamber.

## 2013-10-21 NOTE — Interval H&P Note (Signed)
History and Physical Interval Note:  10/21/2013 9:46 AM  Terri Rodriguez  has presented today for surgery, with the diagnosis of Osteoarthritis of the left Hip  The various methods of treatment have been discussed with the patient and family. After consideration of risks, benefits and other options for treatment, the patient has consented to  Procedure(s): LEFT TOTAL HIP ARTHROPLASTY ANTERIOR APPROACH (Left) as a surgical intervention .  The patient's history has been reviewed, patient examined, no change in status, stable for surgery.  I have reviewed the patient's chart and labs.  Questions were answered to the patient's satisfaction.     Loanne Drilling

## 2013-10-21 NOTE — Preoperative (Signed)
Beta Blockers   Reason not to administer Beta Blockers:Not Applicable 

## 2013-10-21 NOTE — Op Note (Signed)
OPERATIVE REPORT  PREOPERATIVE DIAGNOSIS: Osteoarthritis of the Left hip.   POSTOPERATIVE DIAGNOSIS: Osteoarthritis of the Left  hip.   PROCEDURE: Left total hip arthroplasty, anterior approach.   SURGEON: Ollen Gross, MD   ASSISTANT: Avel Peace, PA-C  ANESTHESIA:  General  ESTIMATED BLOOD LOSS:- 1100 ml  DRAINS: Hemovac x1.   COMPLICATIONS: None   CONDITION: PACU - hemodynamically stable.   BRIEF CLINICAL NOTE: Terri Rodriguez is a 67 y.o. female who has advanced end-  stage arthritis of his Left  hip with progressively worsening pain and  dysfunction.The patient has failed nonoperative management and presents for  total hip arthroplasty.   PROCEDURE IN DETAIL: After successful administration of spinal  anesthetic, the traction boots for the [facility] bed were placed on both  feet and the patient was placed onto the [facility] bed, boots placed into the leg  holders. The Left hip was then isolated from the perineum with plastic  drapes and prepped and draped in the usual sterile fashion. ASIS and  greater trochanter were marked and a oblique incision was made, starting  at about 1 cm lateral and 2 cm distal to the ASIS and coursing towards  the anterior cortex of the femur. The skin was cut with a 10 blade  through subcutaneous tissue to the level of the fascia overlying the  tensor fascia lata muscle. The fascia was then incised in line with the  incision at the junction of the anterior third and posterior 2/3rd. The  muscle was teased off the fascia and then the interval between the TFL  and the rectus was developed. The Hohmann retractor was then placed at  the top of the femoral neck over the capsule. The vessels overlying the  capsule were cauterized and the fat on top of the capsule was removed.  A Hohmann retractor was then placed anterior underneath the rectus  femoris to give exposure to the entire anterior capsule. A T-shaped  capsulotomy was performed. The  edges were tagged and the femoral head  was identified.       Osteophytes are removed off the superior acetabulum.  The femoral neck was then cut in situ with an oscillating saw. Traction  was then applied to the left lower extremity utilizing the [facility]  traction. The femoral head was then removed. Retractors were placed  around the acetabulum and then circumferential removal of the labrum was  performed. Osteophytes were also removed. Reaming starts at 45 mm to  medialize and  Increased in 2 mm increments to 49 mm. We reamed in  approximately 40 degrees of abduction, 20 degrees anteversion. A 50 mm  pinnacle acetabular shell was then impacted in anatomic position under  fluoroscopic guidance with excellent purchase. We did not need to place  any additional dome screws. A 32 mm neutral + 4 marathon liner was then  placed into the acetabular shell.       The femoral lift was then placed along the lateral aspect of the femur  just distal to the vastus ridge. The leg was  externally rotated and capsule  was stripped off the inferior aspect of the femoral neck down to the  level of the lesser trochanter, this was done with electrocautery. The femur was lifted after this was performed. The  leg was then placed and extended in adducted position to essentially delivering the femur. We also removed the capsule superiorly and the  piriformis from the piriformis  fossa to gain excellent exposure of the  proximal femur. Rongeur was used to remove some cancellous bone to get  into the lateral portion of the proximal femur for placement of the  initial starter reamer. The starter broaches was placed  the starter broach  and was shown to go down the center of the canal. Broaching  with the  Corail system was then performed starting at size 8, coursing  Up to size 10. A size 10 had excellent torsional and rotational  and axial stability. The trial high offset neck was then placed  with a 32 + 5 trial head.  The hip was then reduced. We confirmed that  the stem was in the canal both on AP and lateral x-rays. It also has excellent sizing. The hip was reduced with outstanding stability through full extension, full external rotation,  and then flexion in adduction internal rotation. AP pelvis was taken  and the leg lengths were measured and found to 5 mm short on the operative side. We trialed with a +9 head and the leg lengths were equal.Hip  was then dislocated again and the femoral head and neck removed. The  femoral broach was removed. Size 10 Corail stem with a high offset  neck was then impacted into the femur following native anteversion. Has  excellent purchase in the canal. Excellent torsional and rotational and  axial stability. It is confirmed to be in the canal on AP and lateral  fluoroscopic views. The 32 + 9 ceramic head was placed and the hip  reduced with outstanding stability. Again AP pelvis was taken and it  confirmed that the leg lengths were equal. The wound was then copiously  irrigated with saline solution and the capsule reattached and repaired  with Ethibond suture.  20 mL of Exparel mixed with 50 mL of saline then additional 20 ml of .25% Bupivicaine injected into the capsule and into the edge of the tensor fascia lata as well as subcutaneous tissue. The fascia overlying the tensor fascia lata was  then closed with a running #1 V-Loc. Subcu was closed with interrupted  2-0 Vicryl and subcuticular running 4-0 Monocryl. Incision was cleaned  and dried. Steri-Strips and a bulky sterile dressing applied. Hemovac  drain was hooked to suction and then he was awakened and transported to  recovery in stable condition.        Please note that a surgical assistant was a medical necessity for this procedure to perform it in a safe and expeditious manner. Assistant was necessary to provide appropriate retraction of vital neurovascular structures and to prevent femoral fracture and allow for  anatomic placement of the prosthesis.  Ollen Gross, M.D.

## 2013-10-22 ENCOUNTER — Encounter (HOSPITAL_COMMUNITY): Payer: Self-pay | Admitting: Orthopedic Surgery

## 2013-10-22 DIAGNOSIS — D62 Acute posthemorrhagic anemia: Secondary | ICD-10-CM | POA: Diagnosis not present

## 2013-10-22 LAB — BASIC METABOLIC PANEL
BUN: 13 mg/dL (ref 6–23)
CO2: 25 mEq/L (ref 19–32)
Calcium: 8.2 mg/dL — ABNORMAL LOW (ref 8.4–10.5)
Chloride: 104 mEq/L (ref 96–112)
Creatinine, Ser: 0.61 mg/dL (ref 0.50–1.10)
GFR calc Af Amer: >90 mL/min (ref >90)
GFR calc non Af Amer: >90 mL/min (ref >90)
Glucose, Bld: 157 mg/dL — ABNORMAL HIGH (ref 70–99)
Potassium: 3.8 mEq/L (ref 3.5–5.1)
Sodium: 137 mEq/L (ref 135–145)

## 2013-10-22 LAB — CBC
HCT: 27.4 % — ABNORMAL LOW (ref 36.0–46.0)
Hemoglobin: 9.5 g/dL — ABNORMAL LOW (ref 12.0–15.0)
MCH: 32.8 pg (ref 26.0–34.0)
MCHC: 34.7 g/dL (ref 30.0–36.0)
MCV: 94.5 fL (ref 78.0–100.0)
Platelets: 179 10*3/uL (ref 150–400)
RBC: 2.9 MIL/uL — ABNORMAL LOW (ref 3.87–5.11)
RDW: 14 % (ref 11.5–15.5)
WBC: 7.3 10*3/uL (ref 4.0–10.5)

## 2013-10-22 MED ORDER — LIP MEDEX EX OINT: TOPICAL_OINTMENT | CUTANEOUS | Status: DC | PRN

## 2013-10-22 MED ORDER — OMEPRAZOLE 20 MG PO CPDR
40.0000 mg | DELAYED_RELEASE_CAPSULE | Freq: Every day | ORAL | Status: DC
  Administered 2013-10-22 – 2013-10-23 (×2): 40 mg via ORAL
  Filled 2013-10-22 (×2): qty 2

## 2013-10-22 MED ORDER — LIP MEDEX EX OINT
TOPICAL_OINTMENT | CUTANEOUS | Status: AC
  Administered 2013-10-22: 11:00:00
  Filled 2013-10-22: qty 7

## 2013-10-22 MED ORDER — POLYSACCHARIDE IRON COMPLEX 150 MG PO CAPS
150.0000 mg | ORAL_CAPSULE | Freq: Every day | ORAL | Status: DC
  Administered 2013-10-22 – 2013-10-23 (×2): 150 mg via ORAL
  Filled 2013-10-22 (×2): qty 1

## 2013-10-22 MED ORDER — NON FORMULARY: 40.0000 mg | Freq: Every day | Status: DC

## 2013-10-22 NOTE — Evaluation (Signed)
Occupational Therapy Evaluation Patient Details Name: Terri Rodriguez MRN: [id number] DOB: 1946-09-20 Today's Date: 10/22/2013 Time: 5621-3086 OT Time Calculation (min): 41 min  OT Assessment / Plan / Recommendation History of present illness Terri Rodriguez   Clinical Impression   Pt was admitted for the above.  She will benefit from skilled OT to increase independence with adls with supervision level goals in acute.      OT Assessment  Patient needs continued OT Services    Follow Up Recommendations  No OT follow up    Barriers to Discharge      Equipment Recommendations  None recommended by OT    Recommendations for Other Services    Frequency  Min 2X/week    Precautions / Restrictions Precautions Precautions: Fall Restrictions Weight Bearing Restrictions: No   Pertinent Vitals/Pain Pt had a lot of pain in Terri hip when abducting hip for hygiene, improved  After performing this task.  Educated on alternative way to perform hygiene and repositioned. Pt was premedicated.      ADL  Grooming: Teeth care;Wash/dry face;Wash/dry hands;Supervision/safety Where Assessed - Grooming: Supported standing Upper Body Bathing: Set up Where Assessed - Upper Body Bathing: Unsupported sitting Lower Body Bathing: Moderate assistance Where Assessed - Lower Body Bathing: Supported sit to stand Upper Body Dressing: Set up Where Assessed - Upper Body Dressing: Unsupported sitting Lower Body Dressing: Maximal assistance Where Assessed - Lower Body Dressing: Supported sit to stand Toilet Transfer: Hydrographic surveyor Method: Sit to Barista: Raised toilet seat with arms (or 3-in-1 over toilet) Toileting - Clothing Manipulation and Hygiene: Minimal assistance Where Assessed - Glass blower/designer Manipulation and Hygiene: Sit to stand from 3-in-1 or toilet Equipment Used: Rolling walker Transfers/Ambulation Related to ADLs: ambulated to bathroom with min guard.  ADL  Comments: Pt has 2 reachers at home:  explained adl uses.  Introduced toilet aid.  Pt has pain when abducting hips to perform hygiene    OT Diagnosis: Generalized weakness  OT Problem List: Decreased strength;Decreased knowledge of use of DME or AE;Pain OT Treatment Interventions: Self-care/ADL training;DME and/or AE instruction;Patient/family education   OT Goals(Current goals can be found in the care plan section) Acute Rehab OT Goals Patient Stated Goal: I want to walk without pain, to cook. OT Goal Formulation: With patient Time For Goal Achievement: 10/29/13 Potential to Achieve Goals: Good ADL Goals Pt Will Transfer to Toilet: with supervision;ambulating;bedside commode Pt Will Perform Tub/Shower Transfer: Shower transfer;with min guard assist;shower seat Additional ADL Goal #1: pt will use reacher for LB adls with supervision  Visit Information  Last OT Received On: 10/22/13 Assistance Needed: +1 History of Present Illness: Terri Rodriguez       Prior Functioning     Home Living Family/patient expects to be discharged to:: Private residence Living Arrangements: Spouse/significant other;Children Available Help at Discharge: Family Type of Home: House Home Access: Stairs to enter Secretary/administrator of Steps: 1 Entrance Stairs-Rails: None Home Layout: Two level Alternate Level Stairs-Number of Steps: 16 Alternate Level Stairs-Rails: Right Home Equipment: Toilet riser;Shower seat (has 2 toilet risers with arm) Prior Function Level of Independence: Independent Communication Communication: No difficulties         Vision/Perception     Cognition  Cognition Arousal/Alertness: Awake/alert Behavior During Therapy: WFL for tasks assessed/performed Overall Cognitive Status: Within Functional Limits for tasks assessed    Extremity/Trunk Assessment Upper Extremity Assessment Upper Extremity Assessment: Overall WFL for tasks assessed     Mobility  Transfers Sit  to  Stand: From bed;From elevated surface;3: Mod assist;With upper extremity assist Stand to Sit: 4: Min assist;With armrests;To chair/3-in-1 Details for Transfer Assistance: cues for LE placement     Exercise    Balance     End of Session OT - End of Session Activity Tolerance: Patient tolerated treatment well Patient left: in chair;with call bell/phone within reach  GO     Terri Rodriguez 10/22/2013, 1:21 PM Terri Rodriguez, OTR/Terri [phone] 10/22/2013

## 2013-10-22 NOTE — Progress Notes (Signed)
Physical Therapy Treatment Patient Details Name: DARNITA WOODRUM MRN: [id number] DOB: March 26, 1946 Today's Date: 10/22/2013 Time: 2440-1027 PT Time Calculation (min): 10 min  PT Assessment / Plan / Recommendation  History of Present Illness [name]   PT Comments   Progressing.  Follow Up Recommendations  Home health PT     Does the patient have the potential to tolerate intense rehabilitation     Barriers to Discharge        Equipment Recommendations  None recommended by PT    Recommendations for Other Services    Frequency 7X/week   Progress towards PT Goals Progress towards PT goals: Progressing toward goals  Plan Current plan remains appropriate    Precautions / Restrictions Precautions Precautions: Fall Restrictions Weight Bearing Restrictions: No        Mobility  Bed Mobility Bed Mobility: Sit to Supine Sit to Supine: 3: Mod assist Details for Bed Mobility Assistance: Trial of use of Step stool to slide back onto bed using R leg without much success. Used leg lifter to assist LLE onto bed with assist. Transfers Sit to Stand: With upper extremity assist;5: Supervision;From chair/3-in-1 Stand to Sit: To bed;4: Min guard Details for Transfer Assistance: cues for LE placement Ambulation/Gait Ambulation/Gait Assistance: 4: Min guard Ambulation Distance (Feet): 20 Feet Assistive device: Rolling walker Ambulation/Gait Assistance Details: cues for sequence. Gait Pattern: Step-through pattern;Antalgic    Exercises Total Joint Exercises Short Arc Quad: AROM;10 reps;Supine Heel Slides: AAROM;Left;10 reps;Supine Hip ABduction/ADduction: AAROM;Left;10 reps;Supine   PT Diagnosis:    PT Problem List:   PT Treatment Interventions:     PT Goals (current goals can now be found in the care plan section) Acute Rehab PT Goals Patient Stated Goal: I want to walk without pain, to cook.  Visit Information  Last PT Received On: 10/22/13 Assistance Needed: +1 History of  Present Illness: [name]    Subjective Data  Patient Stated Goal: I want to walk without pain, to cook.   Cognition  Cognition Arousal/Alertness: Awake/alert Behavior During Therapy: WFL for tasks assessed/performed Overall Cognitive Status: Within Functional Limits for tasks assessed    Balance     End of Session PT - End of Session Activity Tolerance: Patient tolerated treatment well Patient left: with call bell/phone within reach;in bed Nurse Communication: Mobility status   GP     [name] 10/22/2013, 4:31 PM

## 2013-10-22 NOTE — Progress Notes (Signed)
   Subjective: 1 Day Post-Op Procedure(s) (LRB): LEFT TOTAL HIP ARTHROPLASTY ANTERIOR APPROACH (Left) Patient reports pain as mild and moderate.   Patient seen in rounds with Dr. Lequita Halt. Patient is well, but has had some minor complaints of pain in the hip, requiring pain medications We will start therapy today.  Plan is to go Home after hospital stay.  Objective: Vital signs in last 24 hours: Temp:  [97.4 F (36.3 C)-99.2 F (37.3 C)] 99 F (37.2 C) (11/18 0559) Pulse Rate:  [74-107] 96 (11/18 0559) Resp:  [10-18] 17 (11/18 0800) BP: (96-139)/(62-76) 122/68 mmHg (11/18 0559) SpO2:  [92 %-99 %] 93 % (11/18 0559) Weight:  [111.585 kg (246 lb)] 111.585 kg (246 lb) (11/17 1550)  Intake/Output from previous day:  Intake/Output Summary (Last 24 hours) at 10/22/13 0831 Last data filed at 10/22/13 1610  Gross per 24 hour  Intake 4508.75 ml  Output   3750 ml  Net 758.75 ml    Intake/Output this shift: UOP 500 since MN +758   Labs:  Recent Labs  10/22/13 0419  HGB 9.5*    Recent Labs  10/22/13 0419  WBC 7.3  RBC 2.90*  HCT 27.4*  PLT 179    Recent Labs  10/22/13 0419  NA 137  K 3.8  CL 104  CO2 25  BUN 13  CREATININE 0.61  GLUCOSE 157*  CALCIUM 8.2*   No results found for this basename: LABPT, INR,  in the last 72 hours  EXAM General - Patient is Alert, Appropriate and Oriented Extremity - Neurovascular intact Sensation intact distally Dorsiflexion/Plantar flexion intact Dressing - dressing C/D/I Motor Function - intact, moving foot and toes well on exam.  Hemovac pulled without difficulty.  Past Medical History  Diagnosis Date  . HYPERLIPIDEMIA   . UNSPECIFIED ANEMIA   . LEUKOPENIA, CHRONIC   . SLEEP APNEA, OBSTRUCTIVE   . ALLERGIC RHINITIS   . GERD   . Rosacea   . EPICONDYLITIS, LATERAL   . RAYNAUD'S SYNDROME, HX OF   . Hx of adenomatous polyp of colon 06/05/08  . Hx of cardiac catheterization 2008     clean coronarys DrKelly   . Hx of  chest pain     neg cath remot hx of narrowwing lad in 2003 nl 2008  . Nasal injury 06/14/2012  . Diverticulitis   . Hepatic hemangioma 06/09/08  . Mononucleosis 1963  . Subacute thyroiditis   . IBS (irritable bowel syndrome)   . Hiatal hernia 03/2000  . Cholelithiasis 06/09/08  . Complication of anesthesia 2012    went to ICU after bilateral knees   . Anginal pain     Assessment/Plan: 1 Day Post-Op Procedure(s) (LRB): LEFT TOTAL HIP ARTHROPLASTY ANTERIOR APPROACH (Left) Principal Problem:   OA (osteoarthritis) of hip Active Problems:   Postoperative anemia due to acute blood loss  Estimated body mass index is 39.72 kg/(m^2) as calculated from the following:   Height as of this encounter: 5\' 6"  (1.676 m).   Weight as of this encounter: 111.585 kg (246 lb). Advance diet Up with therapy Plan for discharge tomorrow Discharge home with home health  DVT Prophylaxis - Xarelto Weight Bearing As Tolerated left Leg Hemovac Pulled Begin Therapy   PERKINS, ALEXZANDREW 10/22/2013, 8:31 AM

## 2013-10-22 NOTE — Progress Notes (Signed)
Utilization review completed.  

## 2013-10-22 NOTE — Progress Notes (Signed)
CSW met with pt to assist with d/c planning. Pt plans to return home with [facility] services following hospital d/c. RNCM will assist with d/c planning needs.  Cori Razor LCSW [phone]

## 2013-10-22 NOTE — Progress Notes (Signed)
Physical Therapy Treatment Patient Details Name: Terri Rodriguez MRN: [id number] DOB: Dec 08, 1945 Today's Date: 10/22/2013 Time: 3086-5784 PT Time Calculation (min): 23 min  PT Assessment / Plan / Recommendation  History of Present Illness Terri Rodriguez   PT Comments   Progressing well. Plans DC  Tomorrow.  Follow Up Recommendations  Home health PT     Does the patient have the potential to tolerate intense rehabilitation     Barriers to Discharge        Equipment Recommendations  None recommended by PT    Recommendations for Other Services    Frequency 7X/week   Progress towards PT Goals Progress towards PT goals: Progressing toward goals  Plan Current plan remains appropriate    Precautions / Restrictions Precautions Precautions: Fall Restrictions Weight Bearing Restrictions: No   Pertinent Vitals/Pain Intermittent increased pain Terri thigh, depending on how she moves leg.    Mobility  Bed Mobility Bed Mobility: Sit to Supine Sit to Supine: 3: Mod assist Details for Bed Mobility Assistance: Trial of use of Step stool to slide back onto bed using R leg without much success. Used leg lifter to assist LLE onto bed with assist. Transfers Sit to Stand: From elevated surface;With upper extremity assist;From chair/3-in-1;4: Min guard Stand to Sit: To bed;With upper extremity assist;5: Supervision Details for Transfer Assistance: cues for LE placement Ambulation/Gait Ambulation/Gait Assistance: 4: Min assist Ambulation Distance (Feet): 200 Feet Ambulation/Gait Assistance Details: cues for sequence. Gait Pattern: Step-through pattern;Antalgic    Exercises     PT Diagnosis:    PT Problem List:   PT Treatment Interventions:     PT Goals (current goals can now be found in the care plan section) Acute Rehab PT Goals Patient Stated Goal: I want to walk without pain, to cook.  Visit Information  Last PT Received On: 10/22/13 Assistance Needed: +1 History of Present Illness: Terri  Rodriguez    Subjective Data  Patient Stated Goal: I want to walk without pain, to cook.   Cognition  Cognition Arousal/Alertness: Awake/alert Behavior During Therapy: WFL for tasks assessed/performed Overall Cognitive Status: Within Functional Limits for tasks assessed    Balance     End of Session PT - End of Session Activity Tolerance: Patient tolerated treatment well Patient left: with call bell/phone within reach;in bed Nurse Communication: Mobility status;Patient requests pain meds   GP     Terri Rodriguez 10/22/2013, 4:28 PM

## 2013-10-22 NOTE — Progress Notes (Signed)
Pt placed on cpap for rest, autotitration mode 4-20cm h2o per home settings.  2l o2 bledin.  Pt is using hospital-provided cpap with her nasal pillows from home and tolerating well at this time.  Sterile water added to max fill line of humidity chamber.

## 2013-10-22 NOTE — Evaluation (Signed)
Physical Therapy Evaluation Patient Details Name: Terri Rodriguez MRN: [id number] DOB: 04-29-1946 Today's Date: 10/22/2013 Time: 1010-1055 PT Time Calculation (min): 45 min  PT Assessment / Plan / Recommendation History of Present Illness  [name]  Clinical Impression  Pt ambulated ambulation x 100' with RW, Pt plans DC to home. Pt will benefit from PT to address problems.    PT Assessment  Patient needs continued PT services    Follow Up Recommendations  Home health PT    Does the patient have the potential to tolerate intense rehabilitation      Barriers to Discharge        Equipment Recommendations  None recommended by PT    Recommendations for Other Services     Frequency 7X/week    Precautions / Restrictions Precautions Precautions: Fall   Pertinent Vitals/Pain Pt reports pain in L thigh, especially when took steps backward.requested robaxin.      Mobility  Bed Mobility Bed Mobility: Supine to Sit Supine to Sit: HOB elevated;4: Min assist;3: Mod assist Details for Bed Mobility Assistance: cues for body position to optimize moving to the edge to the left. Transfers Transfers: Sit to Stand;Stand to Sit Sit to Stand: From bed;From elevated surface;3: Mod assist;With upper extremity assist Stand to Sit: 4: Min assist;With armrests;To chair/3-in-1 Details for Transfer Assistance: cues for UE position/LLE position Ambulation/Gait Ambulation/Gait Assistance: 4: Min assist Ambulation Distance (Feet): 100 Feet Assistive device: Rolling walker Ambulation/Gait Assistance Details: cues for sequence Gait Pattern: Step-through pattern;Antalgic    Exercises Total Joint Exercises Heel Slides: AAROM;Left;10 reps;Supine   PT Diagnosis: Difficulty walking;Acute pain  PT Problem List: Decreased strength;Decreased range of motion;Decreased activity tolerance;Decreased mobility;Decreased knowledge of use of DME;Decreased safety awareness;Decreased knowledge of  precautions;Pain PT Treatment Interventions: DME instruction;Gait training;Stair training;Functional mobility training;Therapeutic activities;Therapeutic exercise;Patient/family education     PT Goals(Current goals can be found in the care plan section) Acute Rehab PT Goals Patient Stated Goal: I want to walk without pain, to cook. PT Goal Formulation: With patient Time For Goal Achievement: 10/26/13 Potential to Achieve Goals: Good  Visit Information  Last PT Received On: 10/22/13 Assistance Needed: +1 History of Present Illness: [name]       Prior Functioning  Home Living Family/patient expects to be discharged to:: Private residence Living Arrangements: Spouse/significant other;Children Available Help at Discharge: Family Type of Home: House Home Access: Stairs to enter Secretary/administrator of Steps: 1 Entrance Stairs-Rails: None Home Layout: Two level Alternate Level Stairs-Number of Steps: 16 Alternate Level Stairs-Rails: Right Home Equipment: Walker - 2 wheels;Cane - single point;Bedside commode;Toilet riser;Shower seat;Wheelchair - manual Prior Function Level of Independence: Independent Communication Communication: No difficulties    Cognition  Cognition Arousal/Alertness: Awake/alert Behavior During Therapy: WFL for tasks assessed/performed Overall Cognitive Status: Within Functional Limits for tasks assessed    Extremity/Trunk Assessment Upper Extremity Assessment Upper Extremity Assessment: Defer to OT evaluation Lower Extremity Assessment Lower Extremity Assessment: LLE deficits/detail LLE Deficits / Details: able to advance LLE during gait.   Balance    End of Session PT - End of Session Activity Tolerance: Patient tolerated treatment well Patient left: in chair;with call bell/phone within reach Nurse Communication: Mobility status;Patient requests pain meds  GP     [name] 10/22/2013, 11:09 AM Blanchard Kelch PT [phone]

## 2013-10-23 LAB — BASIC METABOLIC PANEL
BUN: 14 mg/dL (ref 6–23)
CO2: 27 mEq/L (ref 19–32)
Calcium: 8.4 mg/dL (ref 8.4–10.5)
Chloride: 104 mEq/L (ref 96–112)
Creatinine, Ser: 0.61 mg/dL (ref 0.50–1.10)
GFR calc Af Amer: >90 mL/min (ref >90)
GFR calc non Af Amer: >90 mL/min (ref >90)
Glucose, Bld: 168 mg/dL — ABNORMAL HIGH (ref 70–99)
Potassium: 3.7 mEq/L (ref 3.5–5.1)
Sodium: 137 mEq/L (ref 135–145)

## 2013-10-23 LAB — CBC
HCT: 24.9 % — ABNORMAL LOW (ref 36.0–46.0)
Hemoglobin: 8.5 g/dL — ABNORMAL LOW (ref 12.0–15.0)
MCH: 32.7 pg (ref 26.0–34.0)
MCHC: 34.1 g/dL (ref 30.0–36.0)
MCV: 95.8 fL (ref 78.0–100.0)
Platelets: 174 10*3/uL (ref 150–400)
RBC: 2.6 MIL/uL — ABNORMAL LOW (ref 3.87–5.11)
RDW: 14.3 % (ref 11.5–15.5)
WBC: 8.6 10*3/uL (ref 4.0–10.5)

## 2013-10-23 MED ORDER — OXYCODONE HCL 5 MG PO TABS: 5.0000 mg | ORAL_TABLET | ORAL | Status: DC | PRN

## 2013-10-23 MED ORDER — METHOCARBAMOL 500 MG PO TABS: 500.0000 mg | ORAL_TABLET | Freq: Four times a day (QID) | ORAL | Status: DC | PRN

## 2013-10-23 MED ORDER — RIVAROXABAN 10 MG PO TABS: 10.0000 mg | ORAL_TABLET | Freq: Every day | ORAL | Status: DC

## 2013-10-23 MED ORDER — POLYSACCHARIDE IRON COMPLEX 150 MG PO CAPS: 150.0000 mg | ORAL_CAPSULE | Freq: Two times a day (BID) | ORAL | Status: DC

## 2013-10-23 NOTE — Progress Notes (Addendum)
Physical Therapy Treatment Patient Details Name: Terri Rodriguez MRN: [id number] DOB: 01-24-1946 Today's Date: 10/23/2013 Time: 0910-0939 PT Time Calculation (min): 29 min  PT Assessment / Plan / Recommendation  History of Present Illness [name]   PT Comments   Pt progressing  Follow Up Recommendations  Home health PT     Does the patient have the potential to tolerate intense rehabilitation     Barriers to Discharge        Equipment Recommendations  None recommended by PT    Recommendations for Other Services    Frequency 7X/week   Progress towards PT Goals Progress towards PT goals: Progressing toward goals  Plan Current plan remains appropriate    Precautions / Restrictions Precautions Precautions: Fall Restrictions Weight Bearing Restrictions: No   Pertinent Vitals/Pain     Mobility  Bed Mobility Bed Mobility: Supine to Sit Supine to Sit: 4: Min assist Details for Bed Mobility Assistance: cues for technique Transfers Transfers: Sit to Stand;Stand to Sit Sit to Stand: 5: Supervision Stand to Sit: 5: Supervision Details for Transfer Assistance: cues for leg placement Ambulation/Gait Ambulation/Gait Assistance: 4: Min guard;5: Supervision Ambulation Distance (Feet): 160 Feet Assistive device: Rolling walker Ambulation/Gait Assistance Details: cues for step through Gait Pattern: Step-through pattern;Antalgic    Exercises Total Joint Exercises Ankle Circles/Pumps: AROM;10 reps  Pt amb up/down stairs x 2 with RW backwards and cane and rail fwds, min assist and cuesf or technique   PT Diagnosis:    PT Problem List:   PT Treatment Interventions:     PT Goals (current goals can now be found in the care plan section) Acute Rehab PT Goals Time For Goal Achievement: 10/26/13 Potential to Achieve Goals: Good  Visit Information  Last PT Received On: 10/23/13 Assistance Needed: +1 History of Present Illness: [name]    Subjective Data      Cognition   Cognition Arousal/Alertness: Awake/alert Behavior During Therapy: WFL for tasks assessed/performed Overall Cognitive Status: Within Functional Limits for tasks assessed    Balance     End of Session PT - End of Session Equipment Utilized During Treatment: Gait belt Activity Tolerance: Patient tolerated treatment well Patient left: with call bell/phone within reach;in chair Nurse Communication: Mobility status   GP     [facility] 10/23/2013, 1:41 PM

## 2013-10-23 NOTE — Discharge Summary (Signed)
Physician Discharge Summary   Patient ID: Terri Rodriguez MRN: [id number] DOB/AGE: Oct 31, 1946 67 y.o.  Admit date: 10/21/2013 Discharge date: 10/23/2013  Primary Diagnosis:  Osteoarthritis of the Left hip.   Admission Diagnoses:  Past Medical History  Diagnosis Date  . HYPERLIPIDEMIA   . UNSPECIFIED ANEMIA   . LEUKOPENIA, CHRONIC   . SLEEP APNEA, OBSTRUCTIVE   . ALLERGIC RHINITIS   . GERD   . Rosacea   . EPICONDYLITIS, LATERAL   . RAYNAUD'S SYNDROME, HX OF   . Hx of adenomatous polyp of colon 06/05/08  . Hx of cardiac catheterization 2008     clean coronarys DrKelly   . Hx of chest pain     neg cath remot hx of narrowwing lad in 2003 nl 2008  . Nasal injury 06/14/2012  . Diverticulitis   . Hepatic hemangioma 06/09/08  . Mononucleosis 1963  . Subacute thyroiditis   . IBS (irritable bowel syndrome)   . Hiatal hernia 03/2000  . Cholelithiasis 06/09/08  . Complication of anesthesia 2012    went to ICU after bilateral knees   . Anginal pain    Discharge Diagnoses:   Principal Problem:   OA (osteoarthritis) of hip Active Problems:   Postoperative anemia due to acute blood loss  Estimated body mass index is 39.72 kg/(m^2) as calculated from the following:   Height as of this encounter: 5\' 6"  (1.676 m).   Weight as of this encounter: 111.585 kg (246 lb).  Procedure(s) (LRB): LEFT TOTAL HIP ARTHROPLASTY ANTERIOR APPROACH (Left)   Consults: None  HPI: Terri Rodriguez is a 67 y.o. female who has advanced end-  stage arthritis of his Left hip with progressively worsening pain and  dysfunction.The patient has failed nonoperative management and presents for  total hip arthroplasty.   Laboratory Data: Admission on 10/21/2013, Discharged on 10/23/2013  Component Date Value Range Status  . WBC 10/22/2013 7.3  4.0 - 10.5 K/uL Final  . RBC 10/22/2013 2.90* 3.87 - 5.11 MIL/uL Final  . Hemoglobin 10/22/2013 9.5* 12.0 - 15.0 g/dL Final  . HCT 78/29/5621 27.4* 36.0 - 46.0 % Final  .  MCV 10/22/2013 94.5  78.0 - 100.0 fL Final  . MCH 10/22/2013 32.8  26.0 - 34.0 pg Final  . MCHC 10/22/2013 34.7  30.0 - 36.0 g/dL Final  . RDW 30/86/5784 14.0  11.5 - 15.5 % Final  . Platelets 10/22/2013 179  150 - 400 K/uL Final  . Sodium 10/22/2013 137  135 - 145 mEq/L Final  . Potassium 10/22/2013 3.8  3.5 - 5.1 mEq/L Final  . Chloride 10/22/2013 104  96 - 112 mEq/L Final  . CO2 10/22/2013 25  19 - 32 mEq/L Final  . Glucose, Bld 10/22/2013 157* 70 - 99 mg/dL Final  . BUN 69/62/9528 13  6 - 23 mg/dL Final  . Creatinine, Ser 10/22/2013 0.61  0.50 - 1.10 mg/dL Final  . Calcium 41/32/4401 8.2* 8.4 - 10.5 mg/dL Final  . GFR calc non Af Amer 10/22/2013 >90  >90 mL/min Final  . GFR calc Af Amer 10/22/2013 >90  >90 mL/min Final   Comment: (NOTE)                          The eGFR has been calculated using the CKD EPI equation.                          This calculation has  not been validated in all clinical situations.                          eGFR's persistently <90 mL/min signify possible Chronic Kidney                          Disease.  . WBC 10/23/2013 8.6  4.0 - 10.5 K/uL Final  . RBC 10/23/2013 2.60* 3.87 - 5.11 MIL/uL Final  . Hemoglobin 10/23/2013 8.5* 12.0 - 15.0 g/dL Final  . HCT 16/09/9603 24.9* 36.0 - 46.0 % Final  . MCV 10/23/2013 95.8  78.0 - 100.0 fL Final  . MCH 10/23/2013 32.7  26.0 - 34.0 pg Final  . MCHC 10/23/2013 34.1  30.0 - 36.0 g/dL Final  . RDW 54/08/8118 14.3  11.5 - 15.5 % Final  . Platelets 10/23/2013 174  150 - 400 K/uL Final  . Sodium 10/23/2013 137  135 - 145 mEq/L Final  . Potassium 10/23/2013 3.7  3.5 - 5.1 mEq/L Final  . Chloride 10/23/2013 104  96 - 112 mEq/L Final  . CO2 10/23/2013 27  19 - 32 mEq/L Final  . Glucose, Bld 10/23/2013 168* 70 - 99 mg/dL Final  . BUN 14/78/2956 14  6 - 23 mg/dL Final  . Creatinine, Ser 10/23/2013 0.61  0.50 - 1.10 mg/dL Final  . Calcium 21/30/8657 8.4  8.4 - 10.5 mg/dL Final  . GFR calc non Af Amer 10/23/2013 >90  >90  mL/min Final  . GFR calc Af Amer 10/23/2013 >90  >90 mL/min Final   Comment: (NOTE)                          The eGFR has been calculated using the CKD EPI equation.                          This calculation has not been validated in all clinical situations.                          eGFR's persistently <90 mL/min signify possible Chronic Kidney                          Disease.  Hospital Outpatient Visit on 10/15/2013  Component Date Value Range Status  . MRSA, PCR 10/15/2013 NEGATIVE  NEGATIVE Final  . Staphylococcus aureus 10/15/2013 NEGATIVE  NEGATIVE Final   Comment:                                 The Xpert SA Assay (FDA                          approved for NASAL specimens                          in patients over 39 years of age),                          is one component of                          a comprehensive surveillance  program.  Test performance has                          been validated by [facility] for patients greater                          than or equal to 73 year old.                          It is not intended                          to diagnose infection nor to                          guide or monitor treatment.  Marland Kitchen aPTT 10/15/2013 29  24 - 37 seconds Final  . WBC 10/15/2013 3.4* 4.0 - 10.5 K/uL Final  . RBC 10/15/2013 3.92  3.87 - 5.11 MIL/uL Final  . Hemoglobin 10/15/2013 12.7  12.0 - 15.0 g/dL Final  . HCT 82/95/6213 37.0  36.0 - 46.0 % Final  . MCV 10/15/2013 94.4  78.0 - 100.0 fL Final  . MCH 10/15/2013 32.4  26.0 - 34.0 pg Final  . MCHC 10/15/2013 34.3  30.0 - 36.0 g/dL Final  . RDW 08/65/7846 13.7  11.5 - 15.5 % Final  . Platelets 10/15/2013 202  150 - 400 K/uL Final  . Sodium 10/15/2013 136  135 - 145 mEq/L Final  . Potassium 10/15/2013 4.6  3.5 - 5.1 mEq/L Final  . Chloride 10/15/2013 102  96 - 112 mEq/L Final  . CO2 10/15/2013 27  19 - 32 mEq/L Final  . Glucose, Bld 10/15/2013 109* 70 - 99  mg/dL Final  . BUN 96/29/5284 21  6 - 23 mg/dL Final  . Creatinine, Ser 10/15/2013 0.69  0.50 - 1.10 mg/dL Final  . Calcium 13/24/4010 9.5  8.4 - 10.5 mg/dL Final  . Total Protein 10/15/2013 6.8  6.0 - 8.3 g/dL Final  . Albumin 27/25/3664 3.9  3.5 - 5.2 g/dL Final  . AST 40/34/7425 41* 0 - 37 U/L Final  . ALT 10/15/2013 31  0 - 35 U/L Final  . Alkaline Phosphatase 10/15/2013 100  39 - 117 U/L Final  . Total Bilirubin 10/15/2013 0.4  0.3 - 1.2 mg/dL Final  . GFR calc non Af Amer 10/15/2013 87* >90 mL/min Final  . GFR calc Af Amer 10/15/2013 >90  >90 mL/min Final   Comment: (NOTE)                          The eGFR has been calculated using the CKD EPI equation.                          This calculation has not been validated in all clinical situations.                          eGFR's persistently <90 mL/min signify possible Chronic Kidney  Disease.  Marland Kitchen Prothrombin Time 10/15/2013 12.6  11.6 - 15.2 seconds Final  . INR 10/15/2013 0.96  0.00 - 1.49 Final  . ABO/RH(D) 10/15/2013 O NEG   Final  . Antibody Screen 10/15/2013 NEG   Final  . Sample Expiration 10/15/2013 10/24/2013   Final  . Color, Urine 10/15/2013 YELLOW  YELLOW Final  . APPearance 10/15/2013 CLEAR  CLEAR Final  . Specific Gravity, Urine 10/15/2013 1.011  1.005 - 1.030 Final  . pH 10/15/2013 7.5  5.0 - 8.0 Final  . Glucose, UA 10/15/2013 NEGATIVE  NEGATIVE mg/dL Final  . Hgb urine dipstick 10/15/2013 NEGATIVE  NEGATIVE Final  . Bilirubin Urine 10/15/2013 NEGATIVE  NEGATIVE Final  . Ketones, ur 10/15/2013 NEGATIVE  NEGATIVE mg/dL Final  . Protein, ur 84/13/2440 NEGATIVE  NEGATIVE mg/dL Final  . Urobilinogen, UA 10/15/2013 0.2  0.0 - 1.0 mg/dL Final  . Nitrite 10/01/2535 NEGATIVE  NEGATIVE Final  . Leukocytes, UA 10/15/2013 NEGATIVE  NEGATIVE Final   MICROSCOPIC NOT DONE ON URINES WITH NEGATIVE PROTEIN, BLOOD, LEUKOCYTES, NITRITE, OR GLUCOSE <1000 mg/dL.     X-Rays:Dg Chest 2 View  10/15/2013    CLINICAL DATA:  preop left hip replacement  EXAM: CHEST  2 VIEW  COMPARISON:  09/19/2011  FINDINGS: The heart size and mediastinal contours are within normal limits. Both lungs are clear. The visualized skeletal structures are unremarkable.  IMPRESSION: No active cardiopulmonary disease.   Electronically Signed   By: Marlan Palau M.D.   On: 10/15/2013 16:28   Dg Hip Complete Left  10/15/2013   CLINICAL DATA:  Preop left hip replacement  EXAM: LEFT HIP - COMPLETE 2+ VIEW  COMPARISON:  None.  FINDINGS: Advanced degenerative change in left hip joint with complete loss of joint space on the weight-bearing surface. There is acetabular spurring. Irregularity of the femoral head is noted.  Negative for fracture.  Pedicle screw and interbody fusion L3-4 and L4-5.  IMPRESSION: Advanced osteoarthritis left hip.  No fracture.   Electronically Signed   By: Marlan Palau M.D.   On: 10/15/2013 16:29   Dg Pelvis Portable  10/21/2013   CLINICAL DATA:  Postop left hip.  EXAM: PORTABLE PELVIS 1-2 VIEWS  COMPARISON:  10/15/2013.  FINDINGS: Interval left total hip arthroplasty. Subcutaneous air and surgical drain are noted. Moderate degenerative changes in the right hip.  IMPRESSION: Interval left hip arthroplasty with expected postoperative findings.   Electronically Signed   By: Leanna Battles M.D.   On: 10/21/2013 15:11   Dg C-arm 1-60 Min-no Report  10/21/2013   CLINICAL DATA: left anterior hip   C-ARM 1-60 MINUTES  Fluoroscopy was utilized by the requesting physician.  No radiographic  interpretation.     EKG: Orders placed during the hospital encounter of 09/21/11  . EKG     Hospital Course: Patient was admitted to [facility] and taken to the OR and underwent the above state procedure without complications.  Patient tolerated the procedure well and was later transferred to the recovery room and then to the orthopaedic floor for postoperative care.  They were given PO and IV analgesics for pain  control following their surgery.  They were given 24 hours of postoperative antibiotics of  Anti-infectives   Start     Dose/Rate Route Frequency Ordered Stop   10/21/13 1830  ceFAZolin (ANCEF) IVPB 2 g/50 mL premix     2 g 100 mL/hr over 30 Minutes Intravenous Every 6 hours 10/21/13 1554 10/22/13 0120   10/21/13 0915  ceFAZolin (ANCEF) IVPB 2 g/50 mL premix     2 g 100 mL/hr over 30 Minutes Intravenous On call to O.R. 10/21/13 0913 10/21/13 1205     and started on DVT prophylaxis in the form of Xarelto.   PT and OT were ordered for total hip protocol.  The patient was allowed to be WBAT with therapy. Discharge planning was consulted to help with postop disposition and equipment needs.  Patient had a decent night on the evening of surgery.  They started to get up OOB with therapy on day one.  Hemovac drain was pulled without difficulty.  Continued to work with therapy into day two.  Dressing was changed on day two and the incision was healing well.  Patient was seen in rounds and was ready to go home.   Discharge home with home health if meets goals  Diet - Cardiac diet  Follow up - in 2 weeks  Activity - WBAT  Disposition - Home  Condition Upon Discharge - Good  D/C Meds - See DC Summary  DVT Prophylaxis - Xarelto       Discharge Orders   Future Orders Complete By Expires   Call MD / Call 911  As directed    Comments:     If you experience chest pain or shortness of breath, CALL 911 and be transported to the hospital emergency room.  If you develope a fever above 101 F, pus (white drainage) or increased drainage or redness at the wound, or calf pain, call your surgeon's office.   Change dressing  As directed    Comments:     You may change your dressing dressing daily with sterile 4 x 4 inch gauze dressing and paper tape.  Do not submerge the incision under water.   Constipation Prevention  As directed    Comments:     Drink plenty of fluids.  Prune juice may be helpful.  You may  use a stool softener, such as Colace (over the counter) 100 mg twice a day.  Use MiraLax (over the counter) for constipation as needed.   Diet - low sodium heart healthy  As directed    Discharge instructions  As directed    Comments:     Pick up stool softner and laxative for home. Do not submerge incision under water. May shower. Continue to use ice for pain and swelling from surgery.  Total Hip Protocol.  Take Xarelto for two and a half more weeks, then discontinue Xarelto. Once the patient has completed the Xarelto, they may resume the 81 mg Aspirin.   Do not sit on low chairs, stoools or toilet seats, as it may be difficult to get up from low surfaces  As directed    Driving restrictions  As directed    Comments:     No driving until released by the physician.   Increase activity slowly as tolerated  As directed    Lifting restrictions  As directed    Comments:     No lifting until released by the physician.   Patient may shower  As directed    Comments:     You may shower without a dressing once there is no drainage.  Do not wash over the wound.  If drainage remains, do not shower until drainage stops.   TED hose  As directed    Comments:     Use stockings (TED hose) for 3 weeks on both leg(s).  You may remove them  at night for sleeping.   Weight bearing as tolerated  As directed    Questions:     Laterality:     Extremity:         Medication List    STOP taking these medications       aspirin 81 MG tablet     estradiol 0.05 MG/24HR patch  Commonly known as:  VIVELLE-DOT     HYDROcodone-acetaminophen 5-325 MG per tablet  Commonly known as:  NORCO/VICODIN     multivitamin with minerals Tabs tablet     Vitamin D 2000 UNITS Caps      TAKE these medications       aluminum hydroxide-magnesium carbonate 31.7-137 MG/5ML suspension  Commonly known as:  GAVISCON  Take 10 mLs by mouth every 6 (six) hours as needed for indigestion.     amLODipine 5 MG tablet    Commonly known as:  NORVASC  Take 5 mg by mouth every evening.     atorvastatin 20 MG tablet  Commonly known as:  LIPITOR  Take 20 mg by mouth every evening.     carvedilol 3.125 MG tablet  Commonly known as:  COREG  Take 3.125 mg by mouth 2 (two) times daily with a meal.     celecoxib 200 MG capsule  Commonly known as:  CELEBREX  Take 1 capsule (200 mg total) by mouth daily.     DULoxetine 60 MG capsule  Commonly known as:  CYMBALTA  Take 60 mg by mouth every evening.     fexofenadine 180 MG tablet  Commonly known as:  ALLEGRA  Take 180 mg by mouth daily.     FINACEA 15 % cream  Generic drug:  Azelaic Acid  Apply topically 2 (two) times daily. After skin is thoroughly washed and patted dry, gently but thoroughly massage a thin film of azelaic acid cream into the affected area twice daily, in the morning and evening.     iron polysaccharides 150 MG capsule  Commonly known as:  NIFEREX  Take 1 capsule (150 mg total) by mouth 2 (two) times daily.     methocarbamol 500 MG tablet  Commonly known as:  ROBAXIN  Take 1 tablet (500 mg total) by mouth every 6 (six) hours as needed for muscle spasms.     metroNIDAZOLE 1 % gel  Commonly known as:  METROGEL  Apply 1 application topically daily. Apply to face     nitroGLYCERIN 0.4 MG SL tablet  Commonly known as:  NITROSTAT  Place 0.4 mg under the tongue every 5 (five) minutes as needed for chest pain.     omeprazole-sodium bicarbonate 40-1100 MG per capsule  Commonly known as:  ZEGERID  Take 1 capsule by mouth daily before breakfast.     oxyCODONE 5 MG immediate release tablet  Commonly known as:  Oxy IR/ROXICODONE  Take 1-2 tablets (5-10 mg total) by mouth every 3 (three) hours as needed for moderate pain or severe pain.     ramipril 10 MG capsule  Commonly known as:  ALTACE  Take 10 mg by mouth daily.     rivaroxaban 10 MG Tabs tablet  Commonly known as:  XARELTO  - Take 1 tablet (10 mg total) by mouth daily with  breakfast. Take Xarelto for two and a half more weeks, then discontinue Xarelto.  - Once the patient has completed the Xarelto, they may resume the 81 mg Aspirin.     TYLENOL 500 MG tablet  Generic drug:  acetaminophen  Take 500 mg by mouth every 6 (six) hours as needed for mild pain or headache.     valACYclovir 1000 MG tablet  Commonly known as:  VALTREX  Take 1,000 mg by mouth 2 (two) times daily as needed. For outbreaks       Follow-up Information   Follow up with Loanne Drilling, MD. Schedule an appointment as soon as possible for a visit on 11/05/2013. (Call [phone] tomorrow to make the appointment)    Specialty:  Orthopedic Surgery   Contact information:   [address] [place] [address] [phone]       Signed: Patrica Duel 11/07/2013, 1:49 PM

## 2013-10-23 NOTE — Progress Notes (Signed)
Physical Therapy Treatment Patient Details Name: Terri Rodriguez MRN: [id number] DOB: 06/26/1946 Today's Date: 10/23/2013 Time: 1355-1435 PT Time Calculation (min): 40 min  PT Assessment / Plan / Recommendation  History of Present Illness [name]   PT Comments   Pt has met goals, plans to DC today.  Follow Up Recommendations  Home health PT     Does the patient have the potential to tolerate intense rehabilitation     Barriers to Discharge        Equipment Recommendations  None recommended by PT    Recommendations for Other Services    Frequency 7X/week   Progress towards PT Goals Progress towards PT goals: Progressing toward goals  Plan Current plan remains appropriate    Precautions / Restrictions Precautions Precautions: Fall   Pertinent Vitals/Pain Intermittent.    Mobility  Bed Mobility Bed Mobility: Supine to Sit Supine to Sit: 4: Min assist Details for Bed Mobility Assistance: cues for technique Transfers Transfers: Sit to Stand;Stand to Sit Sit to Stand: 5: Supervision Stand to Sit: 5: Supervision Details for Transfer Assistance: cues for leg placement Ambulation/Gait Ambulation/Gait Assistance: 5: Supervision Ambulation Distance (Feet): 300 Feet Assistive device: Rolling walker Ambulation/Gait Assistance Details: cues for step through Gait Pattern: Step-through pattern;Antalgic Stairs: Yes Stairs Assistance: 4: Min guard Stairs Assistance Details (indicate cue type and reason): cues for sequence. Stair Management Technique: One rail Right;Forwards;With cane    Exercises Total Joint Exercises Ankle Circles/Pumps: AROM;10 reps Short Arc Quad: AROM;10 reps;Supine Heel Slides: Left;10 reps;Supine;AROM Hip ABduction/ADduction: Left;10 reps;Supine;AROM Long Arc Quad: AROM;Left;10 reps   PT Diagnosis:    PT Problem List:   PT Treatment Interventions:     PT Goals (current goals can now be found in the care plan section) Acute Rehab PT Goals Time For  Goal Achievement: 10/26/13 Potential to Achieve Goals: Good  Visit Information  Last PT Received On: 10/23/13 Assistance Needed: +1 History of Present Illness: [name]    Subjective Data      Cognition  Cognition Arousal/Alertness: Awake/alert Behavior During Therapy: WFL for tasks assessed/performed Overall Cognitive Status: Within Functional Limits for tasks assessed    Balance     End of Session PT - End of Session Equipment Utilized During Treatment: Gait belt Activity Tolerance: Patient tolerated treatment well Patient left: in chair;with call bell/phone within reach Nurse Communication: Mobility status   GP     [name] 10/23/2013, 2:40 PM Blanchard Kelch PT [phone]

## 2013-10-23 NOTE — Progress Notes (Signed)
Occupational Therapy Treatment Patient Details Name: Terri Rodriguez MRN: [id number] DOB: 04-23-1946 Today's Date: 10/23/2013 Time: 7846-9629 OT Time Calculation (min): 25 min  OT Assessment / Plan / Recommendation  History of present illness [name]   OT comments  All goals met.  Pt discharged from OT.    Follow Up Recommendations  No OT follow up    Barriers to Discharge       Equipment Recommendations  None recommended by OT    Recommendations for Other Services    Frequency     Progress towards OT Goals Progress towards OT goals: Goals met/education completed, patient discharged from OT  Plan      Precautions / Restrictions Precautions Precautions: Fall Restrictions Weight Bearing Restrictions: No   Pertinent Vitals/Pain LLE sore with weight bearing.      ADL  Lower Body Dressing: Min guard (pants with reacher) Where Assessed - Lower Body Dressing: Supported sit to Pharmacist, hospital: Radiographer, therapeutic Method: Sit to Barista: Raised toilet seat with arms (or 3-in-1 over toilet) Toileting - Clothing Manipulation and Hygiene: Minimal assistance Where Assessed - Engineer, mining and Hygiene: Sit to stand from 3-in-1 or toilet Tub/Shower Transfer: Min guard Tub/Shower Transfer Method: Science writer: Walk in shower ADL Comments: pt used reacher for underwear.  Stills min A for hygiene (back)    OT Diagnosis:    OT Problem List:   OT Treatment Interventions:     OT Goals(current goals can now be found in the care plan section)    Visit Information  Last OT Received On: 10/23/13 Assistance Needed: +1 History of Present Illness: [name]    Subjective Data      Prior Functioning       Cognition  Cognition Arousal/Alertness: Awake/alert Behavior During Therapy: WFL for tasks assessed/performed Overall Cognitive Status: Within Functional Limits for tasks assessed     Mobility  Transfers Sit to Stand: 5: Supervision;From chair/3-in-1;With armrests Details for Transfer Assistance: cues for leg placement    Exercises      Balance     End of Session OT - End of Session Activity Tolerance: Patient tolerated treatment well Patient left: in chair;with call bell/phone within reach  GO     [facility] 10/23/2013, 10:19 AM [name], OTR/L [phone] 10/23/2013

## 2013-10-23 NOTE — Progress Notes (Signed)
   Subjective: 2 Days Post-Op Procedure(s) (LRB): LEFT TOTAL HIP ARTHROPLASTY ANTERIOR APPROACH (Left) Patient reports pain as mild and moderate.  Some pain on movement. Patient seen in rounds with Dr. Lequita Halt. Patient is well, but has had some minor complaints of pain in the knee, requiring pain medications Patient is ready to go home later today if does well  Objective: Vital signs in last 24 hours: Temp:  [98 F (36.7 C)-99.6 F (37.6 C)] 98 F (36.7 C) (11/19 0445) Pulse Rate:  [94-99] 96 (11/19 0445) Resp:  [15-17] 16 (11/19 0445) BP: (109-145)/(63-74) 145/74 mmHg (11/19 0445) SpO2:  [93 %-99 %] 99 % (11/19 0445)  Intake/Output from previous day:  Intake/Output Summary (Last 24 hours) at 10/23/13 1610 Last data filed at 10/23/13 0600  Gross per 24 hour  Intake 2027.5 ml  Output   1000 ml  Net 1027.5 ml     Labs:  Recent Labs  10/22/13 0419 10/23/13 0409  HGB 9.5* 8.5*    Recent Labs  10/22/13 0419 10/23/13 0409  WBC 7.3 8.6  RBC 2.90* 2.60*  HCT 27.4* 24.9*  PLT 179 174    Recent Labs  10/22/13 0419 10/23/13 0409  NA 137 137  K 3.8 3.7  CL 104 104  CO2 25 27  BUN 13 14  CREATININE 0.61 0.61  GLUCOSE 157* 168*  CALCIUM 8.2* 8.4   No results found for this basename: LABPT, INR,  in the last 72 hours  EXAM: General - Patient is Alert, Appropriate and Oriented Extremity - Neurovascular intact Sensation intact distally Dorsiflexion/Plantar flexion intact No cellulitis present Incision - clean, dry, no drainage, healing Motor Function - intact, moving foot and toes well on exam.   Assessment/Plan: 2 Days Post-Op Procedure(s) (LRB): LEFT TOTAL HIP ARTHROPLASTY ANTERIOR APPROACH (Left) Procedure(s) (LRB): LEFT TOTAL HIP ARTHROPLASTY ANTERIOR APPROACH (Left) Past Medical History  Diagnosis Date  . HYPERLIPIDEMIA   . UNSPECIFIED ANEMIA   . LEUKOPENIA, CHRONIC   . SLEEP APNEA, OBSTRUCTIVE   . ALLERGIC RHINITIS   . GERD   . Rosacea   .  EPICONDYLITIS, LATERAL   . RAYNAUD'S SYNDROME, HX OF   . Hx of adenomatous polyp of colon 06/05/08  . Hx of cardiac catheterization 2008     clean coronarys DrKelly   . Hx of chest pain     neg cath remot hx of narrowwing lad in 2003 nl 2008  . Nasal injury 06/14/2012  . Diverticulitis   . Hepatic hemangioma 06/09/08  . Mononucleosis 1963  . Subacute thyroiditis   . IBS (irritable bowel syndrome)   . Hiatal hernia 03/2000  . Cholelithiasis 06/09/08  . Complication of anesthesia 2012    went to ICU after bilateral knees   . Anginal pain    Principal Problem:   OA (osteoarthritis) of hip Active Problems:   Postoperative anemia due to acute blood loss  Estimated body mass index is 39.72 kg/(m^2) as calculated from the following:   Height as of this encounter: 5\' 6"  (1.676 m).   Weight as of this encounter: 111.585 kg (246 lb). Up with therapy Discharge home with home health if meets goals Diet - Cardiac diet Follow up - in 2 weeks Activity - WBAT Disposition - Home Condition Upon Discharge - Good D/C Meds - See DC Summary DVT Prophylaxis - Xarelto  Terri Rodriguez 10/23/2013, 7:12 AM

## 2013-11-12 ENCOUNTER — Telehealth: Payer: Self-pay | Admitting: *Deleted

## 2013-11-12 MED ORDER — ESTRADIOL 0.05 MG/24HR TD PTTW: 1.0000 | MEDICATED_PATCH | TRANSDERMAL | Status: DC

## 2013-11-12 NOTE — Telephone Encounter (Signed)
S/W patient and she said she did get the AEX letter saying she needed to schedule. She said this has been a crazy year for her, she just had a hip replacement in November but she's good to go now and ready to come in. Scheduled her for AEX 12/02/13 @ 1:00.  Vivelle Dot 0.05 mg #24 patch/0refills sent to [facility] Order to last her until AEX, patient notified.

## 2013-11-12 NOTE — Telephone Encounter (Signed)
Fax From: [facility] for Vivelle Dot 0.05 mg Last Refilled: 09/19/12 #24 Patches with 3 refills  Aex Scheduled: No AEX currently scheduled Last MMG: 05/06/13 Bi-Rads 1  Left Message To Call Back Re: Patient needs AEX

## 2013-12-02 ENCOUNTER — Encounter: Payer: Self-pay | Admitting: Obstetrics & Gynecology

## 2013-12-02 ENCOUNTER — Ambulatory Visit (INDEPENDENT_AMBULATORY_CARE_PROVIDER_SITE_OTHER): Payer: Medicare Other | Admitting: Obstetrics & Gynecology

## 2013-12-02 VITALS — BP 102/60 | HR 64 | Resp 16 | Ht 66.0 in | Wt 240.4 lb

## 2013-12-02 DIAGNOSIS — Z01419 Encounter for gynecological examination (general) (routine) without abnormal findings: Secondary | ICD-10-CM

## 2013-12-02 MED ORDER — ESTRADIOL 0.5 MG PO TABS: 0.5000 mg | ORAL_TABLET | Freq: Every day | ORAL | Status: DC

## 2013-12-02 NOTE — Patient Instructions (Signed)

## 2013-12-02 NOTE — Progress Notes (Signed)
67 y.o. G3P1 MarriedCaucasianF here for annual exam.  Daughter and son-in-law and granddaughter are still living with patient and her spouse.  This is causing lots of stress.    Going to PCP.  She is having a lot of joint pain that has worsened since her hip replacement six weeks ago.  She is going to see Dr. Fabian Sharp.    No LMP recorded. Patient has had a hysterectomy.          Sexually active: yes  The current method of family planning is status post hysterectomy.    Exercising: no  not regularly Smoker:  no  Health Maintenance: Pap:  08/16/10 WNL History of abnormal Pap:  no MMG:  05/06/13 normal Colonoscopy:  2009 repeat in 5 years, planning to do this year BMD:   2007 TDaP:  2005 Screening Labs: PCP, Hb today: PCP, Urine today: PCP   reports that she has never smoked. She has never used smokeless tobacco. She reports that she drinks about 0.6 ounces of alcohol per week. She reports that she does not use illicit drugs.  Past Medical History  Diagnosis Date  . HYPERLIPIDEMIA   . UNSPECIFIED ANEMIA   . LEUKOPENIA, CHRONIC   . SLEEP APNEA, OBSTRUCTIVE   . ALLERGIC RHINITIS   . GERD   . Rosacea   . EPICONDYLITIS, LATERAL   . RAYNAUD'S SYNDROME, HX OF   . Hx of adenomatous polyp of colon 06/05/08  . Hx of cardiac catheterization 2008     clean coronarys DrKelly   . Hx of chest pain     neg cath remot hx of narrowwing lad in 2003 nl 2008  . Nasal injury 06/14/2012  . Diverticulitis   . Hepatic hemangioma 06/09/08  . Mononucleosis 1963  . Subacute thyroiditis   . IBS (irritable bowel syndrome)   . Hiatal hernia 03/2000  . Cholelithiasis 06/09/08  . Complication of anesthesia 2012    went to ICU after bilateral knees   . Anginal pain     Past Surgical History  Procedure Laterality Date  . Abdominal hysterectomy    . Tonsillectomy    . Cesarean section  1985, 1989  . Dilation and curettage of uterus    . Nasal septoplasty w/ turbinoplasty    . Lumbar surgery fixation with disc  replacement  10/08    Left  . Knee arthroscopy      bilateral  . Replacement total knee      bilateral  . Cardiac catheterization  04/05/2002    possible coronary artery spasm  . Cardiac catheterization  2008  . Tonsillectomy  1952  . Dental implants  11/2002,11/2011,11/2012  . Back surgery  02/06/2008  . Joint replacement  09/21/2011    bilateral knee  . Total hip arthroplasty Left 10/21/2013    Procedure: LEFT TOTAL HIP ARTHROPLASTY ANTERIOR APPROACH;  Surgeon: Loanne Drilling, MD;  Location: [facility];  Service: Orthopedics;  Laterality: Left;    Current Outpatient Prescriptions  Medication Sig Dispense Refill  . acetaminophen (TYLENOL) 500 MG tablet Take 500 mg by mouth every 6 (six) hours as needed for mild pain or headache.       . Alum Hydroxide-Mag Carbonate (GAVISCON PO) Take by mouth as needed.      Marland Kitchen amLODipine (NORVASC) 5 MG tablet Take 5 mg by mouth every evening.      Marland Kitchen atorvastatin (LIPITOR) 20 MG tablet Take 20 mg by mouth every evening.      . Azelaic Acid (  FINACEA) 15 % cream Apply topically 2 (two) times daily. After skin is thoroughly washed and patted dry, gently but thoroughly massage a thin film of azelaic acid cream into the affected area twice daily, in the morning and evening.      . carvedilol (COREG) 3.125 MG tablet Take 3.125 mg by mouth 2 (two) times daily with a meal.      . celecoxib (CELEBREX) 200 MG capsule Take 1 capsule (200 mg total) by mouth daily.  90 capsule  1  . DULoxetine (CYMBALTA) 60 MG capsule Take 60 mg by mouth every evening.      Marland Kitchen estradiol (VIVELLE-DOT) 0.05 MG/24HR patch Place 1 patch (0.05 mg total) onto the skin 2 (two) times a week.  24 patch  0  . fexofenadine (ALLEGRA) 180 MG tablet Take 180 mg by mouth daily.      . methocarbamol (ROBAXIN) 500 MG tablet Take 1 tablet (500 mg total) by mouth every 6 (six) hours as needed for muscle spasms.  80 tablet  0  . metroNIDAZOLE (METROGEL) 1 % gel Apply 1 application topically daily. Apply  to face      . omeprazole-sodium bicarbonate (ZEGERID) 40-1100 MG per capsule Take 1 capsule by mouth daily before breakfast.       . oxyCODONE (OXY IR/ROXICODONE) 5 MG immediate release tablet Take 1-2 tablets (5-10 mg total) by mouth every 3 (three) hours as needed for moderate pain or severe pain.  80 tablet  0  . ramipril (ALTACE) 10 MG capsule Take 10 mg by mouth daily.       . valACYclovir (VALTREX) 1000 MG tablet Take 1,000 mg by mouth 2 (two) times daily as needed. For outbreaks      . iron polysaccharides (NIFEREX) 150 MG capsule Take 1 capsule (150 mg total) by mouth 2 (two) times daily.  42 capsule  0  . nitroGLYCERIN (NITROSTAT) 0.4 MG SL tablet Place 0.4 mg under the tongue every 5 (five) minutes as needed for chest pain.        No current facility-administered medications for this visit.    Family History  Problem Relation Age of Onset  . Rheum arthritis Mother   . Diabetes Mother   . Heart failure Mother   . Thrombocytopenia Mother   . Sleep apnea Mother   . Deep vein thrombosis Father   . Pulmonary embolism Father   . Osteoarthritis Father   . Atrial fibrillation Father   . Dementia Father   . Sleep apnea Father   . Sleep apnea Brother   . Obesity Brother   . Sleep apnea Brother   . Obesity Brother   . Ulcers Mother     PUD  . Colon cancer Maternal Aunt     x 2  . Pancreatic cancer    . Uterine cancer    . Leukemia    . Inflammatory bowel disease      aunt  . Congenital adrenal hyperplasia Grandchild   . Atrial fibrillation Brother     ROS:  Pertinent items are noted in HPI.  Otherwise, a comprehensive ROS was negative.  Exam:   BP 102/60  Pulse 64  Resp 16  Ht 5\' 6"  (1.676 m)  Wt 240 lb 6.4 oz (109.045 kg)  BMI 38.82 kg/m2  Weight change: +5lb   Height: 5\' 6"  (167.6 cm)  Ht Readings from Last 3 Encounters:  12/02/13 5\' 6"  (1.676 m)  10/21/13 5\' 6"  (1.676 m)  10/21/13 5\' 6"  (1.676  m)    General appearance: alert, cooperative and appears stated  age Head: Normocephalic, without obvious abnormality, atraumatic Neck: no adenopathy, supple, symmetrical, trachea midline and thyroid normal to inspection and palpation Lungs: clear to auscultation bilaterally Breasts: normal appearance, no masses or tenderness Heart: regular rate and rhythm Abdomen: soft, non-tender; bowel sounds normal; no masses,  no organomegaly Extremities: extremities normal, atraumatic, no cyanosis or edema Skin: Skin color, texture, turgor normal. No rashes or lesions Lymph nodes: Cervical, supraclavicular, and axillary nodes normal. No abnormal inguinal nodes palpated Neurologic: Grossly normal   Pelvic: External genitalia:  no lesions              Urethra:  normal appearing urethra with no masses, tenderness or lesions              Bartholins and Skenes: normal                 Vagina: normal appearing vagina with normal color and discharge, no lesions              Cervix: absent              Pap taken: no Bimanual Exam:  Uterus:  uterus absent              Adnexa: no mass, fullness, tenderness               Rectovaginal: Confirms               Anus:  normal sphincter tone, no lesions  A:  Well Woman with normal exam PMP, on low dose HRT H/O colonic polyps Arthritis, with worsening pain in last six weeks  P:   Mammogram yearly pap smear not indicated due to hx of TAH/BSO 1993 On Vivelle dot 0.05mg  twice weekly.  Patient may just stop and see how she does off of it.  Will try estradiol 0.5mg  daily.  Rx to pharmacy. Labs with Dr. Fabian Sharp return annually or prn  An After Visit Summary was printed and given to the patient.

## 2014-01-06 ENCOUNTER — Ambulatory Visit (INDEPENDENT_AMBULATORY_CARE_PROVIDER_SITE_OTHER): Payer: Medicare Other | Admitting: Cardiovascular Disease

## 2014-01-06 ENCOUNTER — Encounter: Payer: Self-pay | Admitting: Cardiovascular Disease

## 2014-01-06 VITALS — BP 112/70 | HR 76 | Ht 66.5 in | Wt 241.0 lb

## 2014-01-06 DIAGNOSIS — L719 Rosacea, unspecified: Secondary | ICD-10-CM

## 2014-01-06 DIAGNOSIS — E785 Hyperlipidemia, unspecified: Secondary | ICD-10-CM

## 2014-01-06 DIAGNOSIS — Z8679 Personal history of other diseases of the circulatory system: Secondary | ICD-10-CM

## 2014-01-06 DIAGNOSIS — G4733 Obstructive sleep apnea (adult) (pediatric): Secondary | ICD-10-CM

## 2014-01-06 DIAGNOSIS — I2510 Atherosclerotic heart disease of native coronary artery without angina pectoris: Secondary | ICD-10-CM | POA: Insufficient documentation

## 2014-01-06 NOTE — Progress Notes (Signed)
Patient ID: Terri Rodriguez, female   DOB: 1946/11/30, 68 y.o.   MRN: [id number]     HPI: Terri Rodriguez is a 68 y.o. female who presents to the office today for one-year cardiology evaluation.  Terri Rodriguez is a 68 year old nonpracticing position developed squeezing substernal chest pain in May 2003. Cardiac catheterization demonstrated a 30% tubular narrowing of the proximal LAD. She also is a history of probable ring nonstenotic with a remote history of digital ischemia/spasm. She has been aggressively treated with medical therapy both for potential coronary vasospasm as well as lipid-lowering therapy for treatment methods to optimize endothelial function and blood pressure. She does have significant arthritic issues with degenerative disc disease and has undergone surgery involving L3-L4, L4-L5 by Dr. Sherwood Gambler. She also is status post bilateral knee surgery as well as left hip replacement. She has a history of obstructive sleep apnea on CPAP therapy, history of osteoarthritis which has been progressive. There also is a history of rosacea.  Over the past year she denies recurrent episodes of chest tightness or pressure. She denies any ring on slight phenomenon. She does recall a recent episode of recent esophageal spasm.    Past Medical History  Diagnosis Date  . HYPERLIPIDEMIA   . UNSPECIFIED ANEMIA   . LEUKOPENIA, CHRONIC   . SLEEP APNEA, OBSTRUCTIVE   . ALLERGIC RHINITIS   . GERD   . Rosacea   . EPICONDYLITIS, LATERAL   . RAYNAUD'S SYNDROME, HX OF   . Hx of adenomatous polyp of colon 06/05/08  . Hx of cardiac catheterization 2008     clean coronarys DrKelly   . Hx of chest pain     neg cath remot hx of narrowwing lad in 2003 nl 2008  . Nasal injury 06/14/2012  . Diverticulitis   . Hepatic hemangioma 06/09/08  . Mononucleosis 1963  . Subacute thyroiditis   . IBS (irritable bowel syndrome)   . Hiatal hernia 03/2000  . Cholelithiasis 06/09/08  . Complication of anesthesia 2012    went to  ICU after bilateral knees   . Anginal pain     Past Surgical History  Procedure Laterality Date  . Abdominal hysterectomy    . Tonsillectomy    . Cesarean section  1985, 1989  . Dilation and curettage of uterus    . Nasal septoplasty w/ turbinoplasty    . Lumbar surgery fixation with disc replacement  10/08    Left  . Knee arthroscopy      bilateral  . Replacement total knee      bilateral  . Tonsillectomy  1952  . Dental implants  11/2002,11/2011,11/2012  . Back surgery  02/06/2008  . Joint replacement  09/21/2011    bilateral knee  . Total hip arthroplasty Left 10/21/2013    Procedure: LEFT TOTAL HIP ARTHROPLASTY ANTERIOR APPROACH;  Surgeon: [name], MD;  Location: [facility];  Service: Orthopedics;  Laterality: Left;  . Nocturnal polysomnogram  12/31/2006    Severe obstructive sleep apnea. AHI-87/hr  . Cardiac catheterization  04/07/2007    Noncritical coronary artery disease. Contiue medical therapy.  . Transthoracic echocardiogram  11/09/2007    EF [id number], LV systolic function normal. Mild aortic root dilation.  . Cardiovascular stress test  11/09/2007    Moderate ischemia in Mid Anterior, Mid Anteroseptal, Apical Anterior, and Apical Septal regions. EKG negative for ischemia.  . Cardiac catheterization  12/03/2007    Normal LV function. Mild angiographic mitral valve prolapse. Normal coronary arteries.  Allergies  Allergen Reactions  . Codeine     nausea  . Crab [Shellfish Allergy] Itching and Nausea And Vomiting    Current Outpatient Prescriptions  Medication Sig Dispense Refill  . acetaminophen (TYLENOL) 500 MG tablet Take 500 mg by mouth every 6 (six) hours as needed for mild pain or headache.       . Alum Hydroxide-Mag Carbonate (GAVISCON PO) Take by mouth as needed.      Marland Kitchen amLODipine (NORVASC) 5 MG tablet Take 5 mg by mouth every evening.      Marland Kitchen atorvastatin (LIPITOR) 20 MG tablet Take 20 mg by mouth every evening.      . Azelaic Acid (FINACEA) 15 % cream  Apply topically 2 (two) times daily. After skin is thoroughly washed and patted dry, gently but thoroughly massage a thin film of azelaic acid cream into the affected area twice daily, in the morning and evening.      . carvedilol (COREG) 3.125 MG tablet Take 3.125 mg by mouth 2 (two) times daily with a meal.      . celecoxib (CELEBREX) 200 MG capsule Take 1 capsule (200 mg total) by mouth daily.  90 capsule  1  . DULoxetine (CYMBALTA) 60 MG capsule Take 60 mg by mouth every evening.      Marland Kitchen estradiol (ESTRACE) 0.5 MG tablet Take 1 tablet (0.5 mg total) by mouth daily.  30 tablet  11  . fexofenadine (ALLEGRA) 180 MG tablet Take 180 mg by mouth daily.      . iron polysaccharides (NIFEREX) 150 MG capsule Take 1 capsule (150 mg total) by mouth 2 (two) times daily.  42 capsule  0  . methocarbamol (ROBAXIN) 500 MG tablet Take 1 tablet (500 mg total) by mouth every 6 (six) hours as needed for muscle spasms.  80 tablet  0  . metroNIDAZOLE (METROGEL) 1 % gel Apply 1 application topically daily. Apply to face      . nitroGLYCERIN (NITROSTAT) 0.4 MG SL tablet Place 0.4 mg under the tongue every 5 (five) minutes as needed for chest pain.       Marland Kitchen omeprazole-sodium bicarbonate (ZEGERID) 40-1100 MG per capsule Take 1 capsule by mouth daily before breakfast.       . oxyCODONE (OXY IR/ROXICODONE) 5 MG immediate release tablet Take 1-2 tablets (5-10 mg total) by mouth every 3 (three) hours as needed for moderate pain or severe pain.  80 tablet  0  . ramipril (ALTACE) 10 MG capsule Take 10 mg by mouth daily.       . valACYclovir (VALTREX) 1000 MG tablet Take 1,000 mg by mouth 2 (two) times daily as needed. For outbreaks       No current facility-administered medications for this visit.    History   Social History  . Marital Status: Married    Spouse Name: N/A    Number of Children: 1  . Years of Education: N/A   Occupational History  . Physician    Social History Main Topics  . Smoking status: Never  Smoker   . Smokeless tobacco: Never Used  . Alcohol Use: .6 - 1.2 oz/week    1-2 Glasses of wine per week     Comment: glasses wine/day  . Drug Use: No  . Sexual Activity: Yes    Partners: Male    Birth Control/ Protection: Surgical     Comment: TAH/BSO   Other Topics Concern  . 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 Rheum arthritis Mother   . Diabetes Mother   . Heart failure Mother   . Thrombocytopenia Mother   . Sleep apnea Mother   . Deep vein thrombosis Father   . Pulmonary embolism Father   . Osteoarthritis Father   . Atrial fibrillation Father   . Dementia Father   . Sleep apnea Father   . Sleep apnea Brother   . Obesity Brother   . Sleep apnea Brother   . Obesity Brother   . Ulcers Mother     PUD  . Colon cancer Maternal Aunt     x 2  . Pancreatic cancer    . Uterine cancer    . Leukemia    . Inflammatory bowel disease      aunt  . Congenital adrenal hyperplasia Grandchild   . Atrial fibrillation Brother     ROS is negative for fevers, chills or night sweats.  She has a history of rosacea. She denies change in vision or hearing. She is unaware of lymphadenopathy. She denies recent wheezing. There is no PND or orthopnea. She denies presyncope or syncope. There is no palpitations. There is no chest pain or she denies nausea or vomiting. She does have history of GERD. She denies blood in stool or urine. She denies claudication. At times she notes trace edema. She does have musculoskeletal issues and is status post bilateral knee replacement and left hip replacement and also underwent surgery to her L3-4 and L4-5 degenerative disease of her back. She does have significant osteoarthritis. She denies recent episodes of  Raynaud's phenomena. She has obstructive sleep apnea on CPAP therapy. She denies restless legs. She does have issues with bruxism. He is requiring additional dental work due to her teeth grinding. Other comprehensive 14 point system review is negative.  PE BP 112/70  Pulse 76  Ht 5' 6.5" (1.689 m)  Wt 241 lb (109.317 kg)  BMI 38.32 kg/m2  General: Alert, oriented, no distress. Moderate obesity Skin: normal turgor, no rashes HEENT: Normocephalic, atraumatic. Pupils round and reactive; sclera anicteric;no lid lag. Extraocular muscles intact. No xanthelasmas Nose without nasal septal hypertrophy Mouth/Parynx benign; Mallinpatti scale 3 Neck: No JVD, no carotid bruits; normal carotid upstroke Lungs: clear to ausculatation and percussion; no wheezing or rales Chest wall: no tenderness to palpitation Heart: RRR, s1 s2 normal over 6 systolic murmur per no S3 or S4 gallop. No rub. No heaves. Abdomen: soft, nontender; no hepatosplenomehaly, BS+; abdominal aorta nontender and not dilated by palpation. Back: no CVA tenderness Pulses 2+ Extremities: no clubbing cyanosis or edema, Homan's sign negative  Neurologic: grossly nonfocal; cranial nerves grossly normal. Psychologic: normal affect and mood.  ECG (independently read by me): Sinus rhythm at 76 beats per minute period. She noted T wave inversion in V1 V2 which is old.  LABS:  BMET    Component Value Date/Time   NA 137 10/23/2013 0409   K 3.7 10/23/2013 0409   CL 104 10/23/2013 0409   CO2 27 10/23/2013 0409   GLUCOSE 168* 10/23/2013 0409   GLUCOSE 88 11/08/2006 1015   BUN 14 10/23/2013 0409   CREATININE 0.61 10/23/2013 0409   CALCIUM 8.4 10/23/2013 0409  GFRNONAA >90 10/23/2013 0409   GFRAA >90 10/23/2013 0409     Hepatic Function Panel     Component Value Date/Time   PROT 6.8 10/15/2013 1530   ALBUMIN 3.9 10/15/2013 1530   AST 41* 10/15/2013 1530   ALT 31 10/15/2013 1530   ALKPHOS 100 10/15/2013 1530   BILITOT 0.4  10/15/2013 1530   BILIDIR 0.1 09/19/2012 1111     CBC    Component Value Date/Time   WBC 8.6 10/23/2013 0409   RBC 2.60* 10/23/2013 0409   HGB 8.5* 10/23/2013 0409   HCT 24.9* 10/23/2013 0409   PLT 174 10/23/2013 0409   MCV 95.8 10/23/2013 0409   MCH 32.7 10/23/2013 0409   MCHC 34.1 10/23/2013 0409   RDW 14.3 10/23/2013 0409   LYMPHSABS 1.2 09/19/2012 1111   MONOABS 0.3 09/19/2012 1111   EOSABS 0.1 09/19/2012 1111   BASOSABS 0.0 09/19/2012 1111     BNP No results found for this basename: probnp    Lipid Panel     Component Value Date/Time   CHOL 160 09/19/2012 1111   TRIG 89.0 09/19/2012 1111   HDL 80.30 09/19/2012 1111   CHOLHDL 2 09/19/2012 1111   VLDL 17.8 09/19/2012 1111   LDLCALC 62 09/19/2012 1111     RADIOLOGY: No results found.    ASSESSMENT AND PLAN: Ms. Terri Rodriguez has established mild coronary artery disease by initial cardiac catheterization in May 2003. General a repeat cardiac catheterization which was done by me in 2008 prior to her back surgery after nuclear perfusion study raise the possibility of ischemia. Catheterization at that time showed normalization of coronary arteries without significant obstruction. She is doing well and denies any recent episodes of digital ischemia or rhinitis symptomatology. She does have osteoarthritic changes of her hands which have become progressive. Her pressure today is well controlled on amlodipine 5 mg, ramipril 10 mg, in addition to her carvedilol. She's not having palpitations on low-dose carvedilol. She's on lipid-lowering therapy with atorvastatin 20 mg. I am recommending that she undergo a complete set of laboratory to be done in a fasting state. She will be having blood work done by Dr. Regis Bill and I will review when completed. I will see her in one year for cardiology evaluation.     Troy Sine, MD, [facility]  01/06/2014 5:45 PM

## 2014-01-06 NOTE — Patient Instructions (Signed)
No changes were made today in your therapy. Your physician recommends that you schedule a follow-up appointment in: 1 YEAR. 

## 2014-01-10 ENCOUNTER — Ambulatory Visit (INDEPENDENT_AMBULATORY_CARE_PROVIDER_SITE_OTHER): Payer: Medicare Other | Admitting: Internal Medicine

## 2014-01-10 ENCOUNTER — Encounter: Payer: Self-pay | Admitting: Internal Medicine

## 2014-01-10 VITALS — BP 114/70 | HR 74 | Temp 98.3°F | Ht 66.25 in | Wt 241.0 lb

## 2014-01-10 DIAGNOSIS — M169 Osteoarthritis of hip, unspecified: Secondary | ICD-10-CM

## 2014-01-10 DIAGNOSIS — E785 Hyperlipidemia, unspecified: Secondary | ICD-10-CM

## 2014-01-10 DIAGNOSIS — R739 Hyperglycemia, unspecified: Secondary | ICD-10-CM

## 2014-01-10 DIAGNOSIS — M129 Arthropathy, unspecified: Secondary | ICD-10-CM

## 2014-01-10 DIAGNOSIS — M1990 Unspecified osteoarthritis, unspecified site: Secondary | ICD-10-CM | POA: Insufficient documentation

## 2014-01-10 DIAGNOSIS — Z23 Encounter for immunization: Secondary | ICD-10-CM

## 2014-01-10 DIAGNOSIS — Z8679 Personal history of other diseases of the circulatory system: Secondary | ICD-10-CM

## 2014-01-10 DIAGNOSIS — M1610 Unilateral primary osteoarthritis, unspecified hip: Secondary | ICD-10-CM

## 2014-01-10 DIAGNOSIS — M1612 Unilateral primary osteoarthritis, left hip: Secondary | ICD-10-CM

## 2014-01-10 DIAGNOSIS — R7309 Other abnormal glucose: Secondary | ICD-10-CM

## 2014-01-10 DIAGNOSIS — D62 Acute posthemorrhagic anemia: Secondary | ICD-10-CM | POA: Diagnosis not present

## 2014-01-10 DIAGNOSIS — Z5181 Encounter for therapeutic drug level monitoring: Secondary | ICD-10-CM

## 2014-01-10 LAB — CBC WITH DIFFERENTIAL/PLATELET
Basophils Absolute: 0 10*3/uL (ref 0.0–0.1)
Basophils Relative: 0.8 % (ref 0.0–3.0)
Eosinophils Absolute: 0.1 10*3/uL (ref 0.0–0.7)
Eosinophils Relative: 4.8 % (ref 0.0–5.0)
HCT: 34.4 % — ABNORMAL LOW (ref 36.0–46.0)
Hemoglobin: 11.2 g/dL — ABNORMAL LOW (ref 12.0–15.0)
Lymphocytes Relative: 34.6 % (ref 12.0–46.0)
Lymphs Abs: 1 10*3/uL (ref 0.7–4.0)
MCHC: 32.5 g/dL (ref 30.0–36.0)
MCV: 88.4 fl (ref 78.0–100.0)
Monocytes Absolute: 0.2 10*3/uL (ref 0.1–1.0)
Monocytes Relative: 8.5 % (ref 3.0–12.0)
Neutro Abs: 1.5 10*3/uL (ref 1.4–7.7)
Neutrophils Relative %: 51.3 % (ref 43.0–77.0)
Platelets: 241 10*3/uL (ref 150.0–400.0)
RBC: 3.89 Mil/uL (ref 3.87–5.11)
RDW: 15.4 % — ABNORMAL HIGH (ref 11.5–14.6)
WBC: 2.9 10*3/uL — ABNORMAL LOW (ref 4.5–10.5)

## 2014-01-10 LAB — HEMOGLOBIN A1C: Hgb A1c MFr Bld: 5.7 % (ref 4.6–6.5)

## 2014-01-10 LAB — TSH: TSH: 1 u[IU]/mL (ref 0.35–5.50)

## 2014-01-10 LAB — SEDIMENTATION RATE: Sed Rate: 10 mm/hr (ref 0–22)

## 2014-01-10 NOTE — Progress Notes (Signed)
Chief Complaint  Patient presents with  . 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  Sp hip replacement   12 weeks out.  No pt needed at this time. Much better  In the pain area now can walk.   However she is having problems with her right shoulder when she uses the cane. Her hands becoming more arthritic and stiff difficult to play the piano and is a bit frustrating for her.  Her mother had rheumatoid arthritis. She's not had this history she has a history of her nodes but nothing flaring recently on current medications.  Recently saw her cardiologist Dr. Tresa Endo to see her on a yearly basis but send him a copy of labs done  She is on Cymbalta and still has significant pain and sometimes is down about this but nothing to do taking Celebrex once a day. It tends to wear off in the evening. ROS: See pertinent positives and negatives per HPI. No current chest pain shortness of breath falling or bleeding she is under treatment for obstructive sleep apnea her knees continue to creep.  Past Medical History  Diagnosis Date  . HYPERLIPIDEMIA   . UNSPECIFIED ANEMIA   . LEUKOPENIA, CHRONIC   . SLEEP APNEA, OBSTRUCTIVE   . ALLERGIC RHINITIS   . GERD   . Rosacea   . EPICONDYLITIS, LATERAL   . RAYNAUD'S SYNDROME, HX OF   . Hx of adenomatous polyp of colon 06/05/08  . Hx of cardiac catheterization 2008     clean coronarys DrKelly   . Hx of chest pain     neg cath remot hx of narrowwing lad in 2003 nl 2008  . Nasal injury 06/14/2012  . Diverticulitis   . Hepatic hemangioma 06/09/08  . Mononucleosis 1963  . Subacute thyroiditis   . IBS (irritable bowel syndrome)   . Hiatal hernia 03/2000  . Cholelithiasis 06/09/08  . Complication of anesthesia 2012    went to ICU after bilateral knees   . Anginal pain     Family History  Problem Relation Age of Onset  .  Rheum arthritis Mother   . Diabetes Mother   . Heart failure Mother   . Thrombocytopenia Mother   . Sleep apnea Mother   . Deep vein thrombosis Father   . Pulmonary embolism Father   . Osteoarthritis Father   . Atrial fibrillation Father   . Dementia Father   . Sleep apnea Father   . Sleep apnea Brother   . Obesity Brother   . Sleep apnea Brother   . Obesity Brother   . Ulcers Mother     PUD  . Colon cancer Maternal Aunt     x 2  . Pancreatic cancer    . Uterine cancer    . Leukemia    . Inflammatory bowel disease      aunt  . Congenital adrenal hyperplasia Grandchild   . Atrial fibrillation Brother     History   Social History  . Marital Status: Married    Spouse Name: N/A    Number of Children: 1  . Years of Education: N/A   Occupational History  . Physician    Social History Main Topics  . Smoking status: Never Smoker   . Smokeless tobacco: Never Used  . Alcohol Use: .6 - 1.2 oz/week  1-2 Glasses of wine per week     Comment: glasses wine/day  . Drug Use: No  . Sexual Activity: Yes    Partners: Male    Birth Control/ Protection: Surgical     Comment: TAH/BSO   Other Topics Concern  . 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 acetaminophen (TYLENOL) 500 MG tablet Take 500 mg by mouth every 6 (six) hours as needed for mild pain or headache.   . Alum Hydroxide-Mag Carbonate (GAVISCON PO) Take by mouth as needed.  Marland Kitchen amLODipine (NORVASC) 5 MG tablet Take 5 mg by mouth every evening.  Marland Kitchen atorvastatin (LIPITOR) 20 MG tablet Take 20 mg by mouth every evening.  . Azelaic Acid (FINACEA) 15 % cream Apply topically 2 (two) times daily. After skin is thoroughly washed and patted dry, gently but thoroughly  massage a thin film of azelaic acid cream into the affected area twice daily, in the morning and evening.  . carvedilol (COREG) 3.125 MG tablet Take 3.125 mg by mouth 2 (two) times daily with a meal.  . celecoxib (CELEBREX) 200 MG capsule Take 1 capsule (200 mg total) by mouth daily.  . DULoxetine (CYMBALTA) 60 MG capsule Take 60 mg by mouth every evening.  Marland Kitchen estradiol (ESTRACE) 0.5 MG tablet Take 1 tablet (0.5 mg total) by mouth daily.  . fexofenadine (ALLEGRA) 180 MG tablet Take 180 mg by mouth daily.  . metroNIDAZOLE (METROGEL) 1 % gel Apply 1 application topically daily. Apply to face  . nitroGLYCERIN (NITROSTAT) 0.4 MG SL tablet Place 0.4 mg under the tongue every 5 (five) minutes as needed for chest pain.   Marland Kitchen omeprazole-sodium bicarbonate (ZEGERID) 40-1100 MG per capsule Take 1 capsule by mouth daily before breakfast.   . ramipril (ALTACE) 10 MG capsule Take 10 mg by mouth daily.   . valACYclovir (VALTREX) 1000 MG tablet Take 1,000 mg by mouth 2 (two) times daily as needed. For outbreaks  . [DISCONTINUED] iron polysaccharides (NIFEREX) 150 MG capsule Take 1 capsule (150 mg total) by mouth 2 (two) times daily.  . [DISCONTINUED] methocarbamol (ROBAXIN) 500 MG tablet Take 1 tablet (500 mg total) by mouth every 6 (six) hours as needed for muscle spasms.  . [DISCONTINUED] oxyCODONE (OXY IR/ROXICODONE) 5 MG immediate release tablet Take 1-2 tablets (5-10 mg total) by mouth every 3 (three) hours as needed for moderate pain or severe pain.    EXAM:  BP 114/70  Pulse 74  Temp(Src) 98.3 F (36.8 C) (Oral)  Ht 5' 6.25" (1.683 m)  Wt 241 lb (109.317 kg)  BMI 38.59 kg/m2  SpO2 97%  Body mass index is 38.59 kg/(m^2).  GENERAL: vitals reviewed and listed above, alert, oriented, appears well hydrated and in no acute distress HEENT: atraumatic, conjunctiva  clear, no obvious abnormalities on inspection of external nose and ears  NECK: no obvious masses on inspection palpation  LUNGS: clear to  auscultation bilaterally, no wheezes, rales or rhonchi, good air movement CV: HRRR, no clubbing cyanosis or nl cap refill  MS: Hands show significant deformity and her distal IP joints some PIP no redness or crystals obvious. Walks with antalgia favoring her left hip which was the  one replaced. Otherwise no acute findings  PSYCH: pleasant and cooperative, cognition appears intact. Lab Results  Component Value Date   WBC 8.6 10/23/2013   HGB 8.5* 10/23/2013   HCT 24.9* 10/23/2013   PLT 174 10/23/2013   GLUCOSE 168* 10/23/2013   CHOL 160 09/19/2012   TRIG 89.0 09/19/2012   HDL 80.30 09/19/2012   LDLCALC 62 09/19/2012   ALT 31 10/15/2013   AST 41* 10/15/2013   NA 137 10/23/2013   K 3.7 10/23/2013   CL 104 10/23/2013   CREATININE 0.61 10/23/2013   BUN 14 10/23/2013   CO2 27 10/23/2013   TSH 1.71 09/19/2012   INR 0.96 10/15/2013   HGBA1C 5.4 09/19/2012    ASSESSMENT AND PLAN:  Discussed the following assessment and plan:  Arthritis -  probably OA but seems to be fairly aggressive degenerative family history rheumatoid lab studies rheumatologic opinion ; options for intervention - Plan: Hemoglobin A1c, CBC with Differential, TSH, Lipid panel, Basic metabolic panel, Hepatic function panel, Cyclic citrul peptide antibody, IgG, ANA, Sedimentation rate  Osteoarthritis of left hip - s/p replacment unilateral disease - Plan: Hemoglobin A1c, CBC with Differential, TSH, Lipid panel, Basic metabolic panel, Hepatic function panel, Cyclic citrul peptide antibody, IgG, ANA, Sedimentation rate  Other and unspecified hyperlipidemia - Plan: Hemoglobin A1c, CBC with Differential, TSH, Lipid panel, Basic metabolic panel, Hepatic function panel, Cyclic citrul peptide antibody, IgG, ANA, Sedimentation rate  Postoperative anemia due to acute blood loss - Plan: Hemoglobin A1c, CBC with Differential, TSH, Lipid panel, Basic metabolic panel, Hepatic function panel, Cyclic citrul peptide antibody, IgG, ANA,  Sedimentation rate  Medication monitoring encounter - Plan: Hemoglobin A1c, CBC with Differential, TSH, Lipid panel, Basic metabolic panel, Hepatic function panel, Cyclic citrul peptide antibody, IgG, ANA, Sedimentation rate  RAYNAUD'S SYNDROME, HX OF - Plan: Hemoglobin A1c, CBC with Differential, TSH, Lipid panel, Basic metabolic panel, Hepatic function panel, Cyclic citrul peptide antibody, IgG, ANA, Sedimentation rate  Need for vaccination with 13-polyvalent pneumococcal conjugate vaccine - Plan: Pneumococcal conjugate vaccine 13-valent  Need for tetanus booster - Plan: Td vaccine preservative free greater than or equal to 7yo IM, CANCELED: Tetanus vaccine IM, CANCELED: Tetanus vaccine IM, CANCELED: Tetanus vaccine IM, CANCELED: Tetanus vaccine IM  Hyperglycemia - in hosp ;a1c  -Patient advised to return or notify health care team  if symptoms worsen or persist or new concerns arise.  Patient Instructions  Will notify you  of labs when available. Will arrange  Rheum consult   Then plan follow up. After evaluation.   Standley Brooking. Panosh M.D.  Pre visit review using our clinic review tool, if applicable. No additional management support is needed unless otherwise documented below in the visit note.

## 2014-01-10 NOTE — Patient Instructions (Signed)
Will notify you  of labs when available. Will arrange  Rheum consult   Then plan follow up. After evaluation.

## 2014-01-13 LAB — HEPATIC FUNCTION PANEL
ALT: 31 U/L (ref 0–35)
AST: 46 U/L — ABNORMAL HIGH (ref 0–37)
Albumin: 4.3 g/dL (ref 3.5–5.2)
Alkaline Phosphatase: 104 U/L (ref 39–117)
Bilirubin, Direct: 0 mg/dL (ref 0.0–0.3)
Total Bilirubin: 0.5 mg/dL (ref 0.3–1.2)
Total Protein: 7.1 g/dL (ref 6.0–8.3)

## 2014-01-13 LAB — LIPID PANEL
Cholesterol: 180 mg/dL (ref 0–200)
HDL: 83.7 mg/dL (ref >39.00)
LDL Cholesterol: 80 mg/dL (ref 0–99)
Total CHOL/HDL Ratio: 2
Triglycerides: 84 mg/dL (ref 0.0–149.0)
VLDL: 16.8 mg/dL (ref 0.0–40.0)

## 2014-01-13 LAB — BASIC METABOLIC PANEL
BUN: 20 mg/dL (ref 6–23)
CO2: 28 mEq/L (ref 19–32)
Calcium: 9.1 mg/dL (ref 8.4–10.5)
Chloride: 106 mEq/L (ref 96–112)
Creatinine, Ser: 0.7 mg/dL (ref 0.4–1.2)
GFR: 89.67 mL/min (ref >60.00)
Glucose, Bld: 94 mg/dL (ref 70–99)
Potassium: 4.4 mEq/L (ref 3.5–5.1)
Sodium: 141 mEq/L (ref 135–145)

## 2014-01-13 LAB — CYCLIC CITRUL PEPTIDE ANTIBODY, IGG: Cyclic Citrullin Peptide Ab: <2 U/mL (ref 0.0–5.0)

## 2014-01-13 LAB — ANA: Anti Nuclear Antibody(ANA): NEGATIVE

## 2014-01-20 ENCOUNTER — Other Ambulatory Visit: Payer: Self-pay | Admitting: Family Medicine

## 2014-01-20 DIAGNOSIS — D649 Anemia, unspecified: Secondary | ICD-10-CM

## 2014-01-22 ENCOUNTER — Ambulatory Visit (AMBULATORY_SURGERY_CENTER): Payer: Self-pay | Admitting: *Deleted

## 2014-01-22 VITALS — Ht 66.5 in | Wt 243.2 lb

## 2014-01-22 DIAGNOSIS — Z86010 Personal history of colonic polyps: Secondary | ICD-10-CM

## 2014-01-22 MED ORDER — MOVIPREP 100 G PO SOLR: ORAL | Status: DC

## 2014-01-22 NOTE — Progress Notes (Signed)
No allergies to eggs or soy. No problems with anesthesia.  

## 2014-01-28 ENCOUNTER — Encounter: Payer: Self-pay | Admitting: Internal Medicine

## 2014-02-05 ENCOUNTER — Encounter: Payer: Self-pay | Admitting: Internal Medicine

## 2014-02-05 ENCOUNTER — Ambulatory Visit (AMBULATORY_SURGERY_CENTER): Payer: Medicare Other | Admitting: Internal Medicine

## 2014-02-05 VITALS — BP 123/55 | HR 78 | Temp 97.0°F | Resp 80 | Ht 66.5 in | Wt 243.0 lb

## 2014-02-05 DIAGNOSIS — D126 Benign neoplasm of colon, unspecified: Secondary | ICD-10-CM

## 2014-02-05 DIAGNOSIS — Z86010 Personal history of colonic polyps: Secondary | ICD-10-CM

## 2014-02-05 DIAGNOSIS — Z1211 Encounter for screening for malignant neoplasm of colon: Secondary | ICD-10-CM

## 2014-02-05 MED ORDER — SODIUM CHLORIDE 0.9 % IV SOLN: 500.0000 mL | INTRAVENOUS | Status: DC

## 2014-02-05 NOTE — Op Note (Signed)
[place]  [address]. [place], [address]   COLONOSCOPY PROCEDURE REPORT  PATIENT: Terri Rodriguez, Terri Rodriguez  MR#: [id number] BIRTHDATE: December 13, 1945 , 68  yrs. old GENDER: Female ENDOSCOPIST: Lafayette Dragon, MD REFERRED FA:OZHYQ Darnelle Going, M.D. PROCEDURE DATE:  02/05/2014 PROCEDURE:   Colonoscopy, screening First Screening Colonoscopy - Avg.  risk and is 50 yrs.  old or older - No.  Prior Negative Screening - Now for repeat screening. N/A  History of Adenoma - Now for follow-up colonoscopy & has been > or = to 3 yrs.  Yes hx of adenoma.  Has been 3 or more years since last colonoscopy.  Polyps Removed Today? No.  Recommend repeat exam, <10 yrs? Yes.  High risk (family or personal hx). ASA CLASS:   Class II INDICATIONS:family history of colon cancer in 2 maternal aunts. Prior colonoscopies in 1995, 2001, 2007, 2009 and in January 2011. History of adenomatous polyps. MEDICATIONS: MAC sedation, administered by CRNA and Propofol (Diprivan) 320 mg IV  DESCRIPTION OF PROCEDURE:   After the risks benefits and alternatives of the procedure were thoroughly explained, informed consent was obtained.  A digital rectal exam revealed no abnormalities of the rectum.   The LB [id number]  endoscope was introduced through the anus and advanced to the cecum, which was identified by both the appendix and ileocecal valve. No adverse events experienced.   The quality of the prep was good, using MoviPrep  The instrument was then slowly withdrawn as the colon was fully examined.      COLON FINDINGS: A normal appearing cecum, ileocecal valve, and appendiceal orifice were identified.  The ascending, hepatic flexure, transverse, splenic flexure, descending, sigmoid colon and rectum appeared unremarkable.  No polyps or cancers were seen. Small internal hemorrhoids were found.  Retroflexed views revealed no abnormalities. The time to cecum=10 minutes 31 seconds. Withdrawal time=9  minutes 02 seconds.  The scope was withdrawn and the procedure completed. COMPLICATIONS: There were no complications.  ENDOSCOPIC IMPRESSION: 1.   Normal colon 2.   Small internal hemorrhoids  RECOMMENDATIONS: high fiber diet Recall colonoscopy in 5- 7 years   eSigned:  Lafayette Dragon, MD 02/05/2014 11:03 AM   cc:   PATIENT NAME:  Terri Rodriguez, Terri Rodriguez MR#: [id number]

## 2014-02-05 NOTE — Progress Notes (Signed)
Report to pacu rn, vss, bbs=clear 

## 2014-02-05 NOTE — Patient Instructions (Signed)
YOU HAD AN ENDOSCOPIC PROCEDURE TODAY AT THE [facility]: Refer to the procedure report that was given to you for any specific questions about what was found during the examination.  If the procedure report does not answer your questions, please call your gastroenterologist to clarify.  If you requested that your care partner not be given the details of your procedure findings, then the procedure report has been included in a sealed envelope for you to review at your convenience later.  YOU SHOULD EXPECT: Some feelings of bloating in the abdomen. Passage of more gas than usual.  Walking can help get rid of the air that was put into your GI tract during the procedure and reduce the bloating. If you had a lower endoscopy (such as a colonoscopy or flexible sigmoidoscopy) you may notice spotting of blood in your stool or on the toilet paper. If you underwent a bowel prep for your procedure, then you may not have a normal bowel movement for a few days.  DIET: Your first meal following the procedure should be a light meal and then it is ok to progress to your normal diet.  A half-sandwich or bowl of soup is an example of a good first meal.  Heavy or fried foods are harder to digest and may make you feel nauseous or bloated.  Likewise meals heavy in dairy and vegetables can cause extra gas to form and this can also increase the bloating.  Drink plenty of fluids but you should avoid alcoholic beverages for 24 hours.  ACTIVITY: Your care partner should take you home directly after the procedure.  You should plan to take it easy, moving slowly for the rest of the day.  You can resume normal activity the day after the procedure however you should NOT DRIVE or use heavy machinery for 24 hours (because of the sedation medicines used during the test).    SYMPTOMS TO REPORT IMMEDIATELY: A gastroenterologist can be reached at any hour.  During normal business hours, 8:30 AM to 5:00 PM Monday through Friday,  call [phone].  After hours and on weekends, please call the GI answering service at [phone] who will take a message and have the physician on call contact you.   Following lower endoscopy (colonoscopy or flexible sigmoidoscopy):  Excessive amounts of blood in the stool  Significant tenderness or worsening of abdominal pains  Swelling of the abdomen that is new, acute  Fever of 100F or higher    FOLLOW UP: If any biopsies were taken you will be contacted by phone or by letter within the next 1-3 weeks.  Call your gastroenterologist if you have not heard about the biopsies in 3 weeks.  Our staff will call the home number listed on your records the next business day following your procedure to check on you and address any questions or concerns that you may have at that time regarding the information given to you following your procedure. This is a courtesy call and so if there is no answer at the home number and we have not heard from you through the emergency physician on call, we will assume that you have returned to your regular daily activities without incident.  SIGNATURES/CONFIDENTIALITY: You and/or your care partner have signed paperwork which will be entered into your electronic medical record.  These signatures attest to the fact that that the information above on your After Visit Summary has been reviewed and is understood.  Full responsibility of the confidentiality   of this discharge information lies with you and/or your care-partner.   Normal colonoscopy.  Hemorrhoid, and high fiber diet information given.  Recall 5-7 years.

## 2014-02-06 ENCOUNTER — Telehealth: Payer: Self-pay | Admitting: *Deleted

## 2014-02-06 NOTE — Telephone Encounter (Signed)
  Follow up Call-  Call back number 02/05/2014  Post procedure Call Back phone  # [phone]  Permission to leave phone message Yes     Patient questions:  Do you have a fever, pain , or abdominal swelling? no Pain Score  0 *  Have you tolerated food without any problems? yes  Have you been able to return to your normal activities? yes  Do you have any questions about your discharge instructions: Diet   no Medications  no Follow up visit  no  Do you have questions or concerns about your Care? no  Actions: * If pain score is 4 or above: No action needed, pain <4.

## 2014-02-07 ENCOUNTER — Other Ambulatory Visit (INDEPENDENT_AMBULATORY_CARE_PROVIDER_SITE_OTHER): Payer: Medicare Other

## 2014-02-07 DIAGNOSIS — D649 Anemia, unspecified: Secondary | ICD-10-CM

## 2014-02-07 LAB — CBC WITH DIFFERENTIAL/PLATELET
Basophils Absolute: 0 10*3/uL (ref 0.0–0.1)
Basophils Relative: 0.7 % (ref 0.0–3.0)
Eosinophils Absolute: 0.1 10*3/uL (ref 0.0–0.7)
Eosinophils Relative: 3.4 % (ref 0.0–5.0)
HCT: 33.1 % — ABNORMAL LOW (ref 36.0–46.0)
Hemoglobin: 10.9 g/dL — ABNORMAL LOW (ref 12.0–15.0)
Lymphocytes Relative: 41.6 % (ref 12.0–46.0)
Lymphs Abs: 1.3 10*3/uL (ref 0.7–4.0)
MCHC: 32.9 g/dL (ref 30.0–36.0)
MCV: 85.7 fl (ref 78.0–100.0)
Monocytes Absolute: 0.3 10*3/uL (ref 0.1–1.0)
Monocytes Relative: 9.5 % (ref 3.0–12.0)
Neutro Abs: 1.4 10*3/uL (ref 1.4–7.7)
Neutrophils Relative %: 44.8 % (ref 43.0–77.0)
Platelets: 232 10*3/uL (ref 150.0–400.0)
RBC: 3.86 Mil/uL — ABNORMAL LOW (ref 3.87–5.11)
RDW: 15.9 % — ABNORMAL HIGH (ref 11.5–14.6)
WBC: 3.1 10*3/uL — ABNORMAL LOW (ref 4.5–10.5)

## 2014-02-11 ENCOUNTER — Other Ambulatory Visit: Payer: Self-pay

## 2014-02-11 ENCOUNTER — Telehealth: Payer: Self-pay | Admitting: Internal Medicine

## 2014-02-11 MED ORDER — RAMIPRIL 10 MG PO CAPS: 10.0000 mg | ORAL_CAPSULE | Freq: Every day | ORAL | Status: DC

## 2014-02-11 MED ORDER — AMLODIPINE BESYLATE 5 MG PO TABS: 5.0000 mg | ORAL_TABLET | Freq: Every evening | ORAL | Status: DC

## 2014-02-11 MED ORDER — CARVEDILOL 3.125 MG PO TABS: 3.1250 mg | ORAL_TABLET | Freq: Two times a day (BID) | ORAL | Status: DC

## 2014-02-11 MED ORDER — ATORVASTATIN CALCIUM 20 MG PO TABS: 20.0000 mg | ORAL_TABLET | Freq: Every evening | ORAL | Status: DC

## 2014-02-11 MED ORDER — CELECOXIB 200 MG PO CAPS: 200.0000 mg | ORAL_CAPSULE | Freq: Every day | ORAL | Status: DC

## 2014-02-11 NOTE — Telephone Encounter (Signed)
Ok to refill  X 6 months sent in.

## 2014-02-11 NOTE — Telephone Encounter (Signed)
Rx was sent to pharmacy electronically. 

## 2014-02-11 NOTE — Addendum Note (Signed)
Addended by: Diana Eves on: 02/11/2014 11:08 AM   Modules accepted: Orders

## 2014-02-11 NOTE — Telephone Encounter (Signed)
[facility] (MAIL ORDER) ELECTRONIC - [place], [place] requesting re-fill ofcelecoxib (CELEBREX) 200 MG capsule

## 2014-02-14 ENCOUNTER — Other Ambulatory Visit: Payer: Self-pay | Admitting: Family Medicine

## 2014-02-14 DIAGNOSIS — D72819 Decreased white blood cell count, unspecified: Secondary | ICD-10-CM

## 2014-02-14 DIAGNOSIS — D649 Anemia, unspecified: Secondary | ICD-10-CM

## 2014-04-03 ENCOUNTER — Other Ambulatory Visit (INDEPENDENT_AMBULATORY_CARE_PROVIDER_SITE_OTHER): Payer: Medicare Other

## 2014-04-03 DIAGNOSIS — D72819 Decreased white blood cell count, unspecified: Secondary | ICD-10-CM

## 2014-04-03 DIAGNOSIS — D649 Anemia, unspecified: Secondary | ICD-10-CM

## 2014-04-03 HISTORY — PX: WRIST SURGERY: SHX841

## 2014-04-03 LAB — IBC PANEL
Iron: 63 ug/dL (ref 42–145)
Saturation Ratios: 14.5 % — ABNORMAL LOW (ref 20.0–50.0)
Transferrin: 309.4 mg/dL (ref 212.0–360.0)

## 2014-04-03 LAB — CBC WITH DIFFERENTIAL/PLATELET
Basophils Absolute: 0 10*3/uL (ref 0.0–0.1)
Basophils Relative: 0.5 % (ref 0.0–3.0)
Eosinophils Absolute: 0.2 10*3/uL (ref 0.0–0.7)
Eosinophils Relative: 5 % (ref 0.0–5.0)
HCT: 35 % — ABNORMAL LOW (ref 36.0–46.0)
Hemoglobin: 11.6 g/dL — ABNORMAL LOW (ref 12.0–15.0)
Lymphocytes Relative: 39.9 % (ref 12.0–46.0)
Lymphs Abs: 1.3 10*3/uL (ref 0.7–4.0)
MCHC: 33.3 g/dL (ref 30.0–36.0)
MCV: 86.8 fl (ref 78.0–100.0)
Monocytes Absolute: 0.3 10*3/uL (ref 0.1–1.0)
Monocytes Relative: 8.8 % (ref 3.0–12.0)
Neutro Abs: 1.5 10*3/uL (ref 1.4–7.7)
Neutrophils Relative %: 45.8 % (ref 43.0–77.0)
Platelets: 259 10*3/uL (ref 150.0–400.0)
RBC: 4.03 Mil/uL (ref 3.87–5.11)
RDW: 18.1 % — ABNORMAL HIGH (ref 11.5–14.6)
WBC: 3.3 10*3/uL — ABNORMAL LOW (ref 4.5–10.5)

## 2014-04-03 LAB — FERRITIN: Ferritin: 12.8 ng/mL (ref 10.0–291.0)

## 2014-04-04 LAB — CELIAC PANEL 10
Endomysial Screen: NEGATIVE
Gliadin IgA: 4.3 U/mL (ref <20)
Gliadin IgG: 4.6 U/mL (ref <20)
IgA: 207 mg/dL (ref 69–380)
Tissue Transglut Ab: 4.9 U/mL (ref <20)
Tissue Transglutaminase Ab, IgA: 3.6 U/mL (ref <20)

## 2014-04-15 ENCOUNTER — Telehealth: Payer: Self-pay | Admitting: Obstetrics & Gynecology

## 2014-04-15 NOTE — Telephone Encounter (Signed)
[facility] appointment for mmg 05/09/14. Patient states she was to call and have Dr. Sabra Heck send an order for bone density so she can have that at the same time.

## 2014-04-15 NOTE — Telephone Encounter (Signed)
Order form to Dr.Miller's desk for review and signature.

## 2014-04-18 ENCOUNTER — Telehealth: Payer: Self-pay | Admitting: *Deleted

## 2014-04-18 NOTE — Telephone Encounter (Signed)
Faxed cardiac clearance to get right hip surgery.

## 2014-05-09 LAB — HM MAMMOGRAPHY

## 2014-05-12 ENCOUNTER — Encounter: Payer: Self-pay | Admitting: Internal Medicine

## 2014-05-23 ENCOUNTER — Telehealth: Payer: Self-pay | Admitting: Internal Medicine

## 2014-05-23 DIAGNOSIS — G4733 Obstructive sleep apnea (adult) (pediatric): Secondary | ICD-10-CM

## 2014-05-23 NOTE — Telephone Encounter (Signed)
Ok to send [facility] a refill Rx for Cpap mask of choice and supplies Make a ROV appointment for OSA f/u- routine schedule availability

## 2014-05-23 NOTE — Telephone Encounter (Signed)
Called spoke with pt. aptp scheduled. Nothing further needed

## 2014-05-23 NOTE — Telephone Encounter (Signed)
Pt has returned call. °

## 2014-05-23 NOTE — Telephone Encounter (Signed)
Pt last seen 03/20/12  No pending appt Please advise Dr. Annamaria Boots thanks

## 2014-05-23 NOTE — Telephone Encounter (Signed)
lmomtcb x1 for pt Order placed and staff message sent to [facility]

## 2014-05-26 ENCOUNTER — Telehealth: Payer: Self-pay

## 2014-05-26 NOTE — Telephone Encounter (Signed)
Patient notified of BMD results.//kn 

## 2014-06-02 ENCOUNTER — Ambulatory Visit: Payer: Medicare Other | Admitting: Internal Medicine

## 2014-06-02 ENCOUNTER — Encounter: Payer: Self-pay | Admitting: Internal Medicine

## 2014-06-20 ENCOUNTER — Encounter: Payer: Self-pay | Admitting: Internal Medicine

## 2014-06-20 ENCOUNTER — Ambulatory Visit (INDEPENDENT_AMBULATORY_CARE_PROVIDER_SITE_OTHER): Payer: Medicare Other | Admitting: Internal Medicine

## 2014-06-20 VITALS — BP 118/62 | HR 87 | Ht 66.5 in | Wt 246.0 lb

## 2014-06-20 DIAGNOSIS — G4733 Obstructive sleep apnea (adult) (pediatric): Secondary | ICD-10-CM

## 2014-06-20 DIAGNOSIS — M1990 Unspecified osteoarthritis, unspecified site: Secondary | ICD-10-CM

## 2014-06-20 DIAGNOSIS — M129 Arthropathy, unspecified: Secondary | ICD-10-CM

## 2014-06-20 NOTE — Assessment & Plan Note (Signed)
She says this is OA

## 2014-06-20 NOTE — Assessment & Plan Note (Signed)
Good compliance and control. Last download indicated Autoset range 4-20, but we have notation that upper pressure limit was set at 12. She is happy with what she has. Plan - new donwnload for update. Renewal for supplies and etc.

## 2014-06-20 NOTE — Progress Notes (Signed)
[id number]- 78 yoF previously followed for OSA-FOLLOWS FOR:  Wearing CPAP 6-8 hours per night.  Needs order for new supplies [facility] NPSG 12/28/06- Severe OSA, AHI 87/ hr  Good CPAP compliance and control/ [facility] on AutoSet 4-20. Likes it "fine", used all night, every night.  Hx of Printzmetal's angina, but controlled and denies active heart or lung problems.  ROS-see HPI Constitutional:   No-   weight loss, night sweats, fevers, chills, fatigue, lassitude. HEENT:   No-  headaches, difficulty swallowing, tooth/dental problems, sore throat,       No-  sneezing, itching, ear ache, nasal congestion, post nasal drip,  CV:  No-   chest pain, orthopnea, PND, swelling in lower extremities, anasarca,                                  dizziness, palpitations Resp: No-   shortness of breath with exertion or at rest.              No-   productive cough,  No non-productive cough,  No- coughing up of blood.              No-   change in color of mucus.  No- wheezing.   Skin: No-   rash or lesions. GI:  No-   heartburn, indigestion, abdominal pain, nausea, vomiting,  GU:  MS:  +joint pain or swelling.  Neuro-     nothing unusual Psych:  No- change in mood or affect. No depression or anxiety.  No memory loss.  OBJ- Physical Exam General- Alert, Oriented, Affect-appropriate, Distress- none acute, +overweight Skin- rash-none, lesions- none, excoriation- none Lymphadenopathy- none Head- atraumatic            Eyes- Gross vision intact, PERRLA, conjunctivae and secretions clear            Ears- Hearing, canals-normal            Nose- Clear, no-Septal dev, mucus, polyps, erosion, perforation             Throat- Mallampati II-III , mucosa clear , drainage- none, tonsils- atrophic Neck- flexible , trachea midline, no stridor , thyroid nl, carotid no bruit Chest - symmetrical excursion , unlabored           Heart/CV- RRR , no murmur , no gallop  , no rub, nl s1 s2                           - JVD- none , edema- none,  stasis changes- none, varices- none           Lung- clear to P&A, wheeze- none, cough- none , dullness-none, rub- none           Chest wall-  Abd-  Br/ Gen/ Rectal- Not done, not indicated Extrem- cyanosis- none, clubbing, none, atrophy- none, strength- nl Neuro- grossly intact to observation

## 2014-06-20 NOTE — Patient Instructions (Signed)
We can continue CPAP auto/ [facility]  Order-  DME download for pressure/ compliance   Dx OSA                Replacement mask of choice, humidifier, supplies as needed

## 2014-07-01 ENCOUNTER — Other Ambulatory Visit: Payer: Self-pay | Admitting: Orthopedic Surgery

## 2014-07-01 NOTE — Progress Notes (Signed)
Need orders please - PT COMING FOR PREOP THURS 07/10/14 - thank you

## 2014-07-03 ENCOUNTER — Encounter (HOSPITAL_COMMUNITY): Payer: Self-pay | Admitting: Pharmacy Technician

## 2014-07-09 ENCOUNTER — Encounter (HOSPITAL_COMMUNITY): Payer: Self-pay

## 2014-07-10 ENCOUNTER — Encounter (HOSPITAL_COMMUNITY)
Admission: RE | Admit: 2014-07-10 | Discharge: 2014-07-10 | Disposition: A | Discharge status: Home / Self Care | Payer: Medicare Other | Source: Ambulatory Visit | Attending: Orthopedic Surgery | Admitting: Orthopedic Surgery

## 2014-07-10 ENCOUNTER — Ambulatory Visit (HOSPITAL_COMMUNITY)
Admission: RE | Admit: 2014-07-10 | Discharge: 2014-07-10 | Disposition: A | Discharge status: Home / Self Care | Payer: Medicare Other | Source: Ambulatory Visit | Attending: Orthopedic Surgery | Admitting: Orthopedic Surgery

## 2014-07-10 ENCOUNTER — Encounter (HOSPITAL_COMMUNITY): Payer: Self-pay

## 2014-07-10 DIAGNOSIS — Z96649 Presence of unspecified artificial hip joint: Secondary | ICD-10-CM | POA: Insufficient documentation

## 2014-07-10 DIAGNOSIS — M949 Disorder of cartilage, unspecified: Secondary | ICD-10-CM

## 2014-07-10 DIAGNOSIS — M899 Disorder of bone, unspecified: Secondary | ICD-10-CM | POA: Insufficient documentation

## 2014-07-10 DIAGNOSIS — M1610 Unilateral primary osteoarthritis, unspecified hip: Secondary | ICD-10-CM | POA: Diagnosis not present

## 2014-07-10 DIAGNOSIS — M169 Osteoarthritis of hip, unspecified: Secondary | ICD-10-CM | POA: Insufficient documentation

## 2014-07-10 DIAGNOSIS — Z01818 Encounter for other preprocedural examination: Secondary | ICD-10-CM | POA: Diagnosis not present

## 2014-07-10 DIAGNOSIS — Z01812 Encounter for preprocedural laboratory examination: Secondary | ICD-10-CM | POA: Insufficient documentation

## 2014-07-10 LAB — PROTIME-INR
INR: 0.97 (ref 0.00–1.49)
Prothrombin Time: 12.9 seconds (ref 11.6–15.2)

## 2014-07-10 LAB — CBC
HCT: 34.8 % — ABNORMAL LOW (ref 36.0–46.0)
Hemoglobin: 11.7 g/dL — ABNORMAL LOW (ref 12.0–15.0)
MCH: 30.3 pg (ref 26.0–34.0)
MCHC: 33.6 g/dL (ref 30.0–36.0)
MCV: 90.2 fL (ref 78.0–100.0)
Platelets: 217 10*3/uL (ref 150–400)
RBC: 3.86 MIL/uL — ABNORMAL LOW (ref 3.87–5.11)
RDW: 15 % (ref 11.5–15.5)
WBC: 3.3 10*3/uL — ABNORMAL LOW (ref 4.0–10.5)

## 2014-07-10 LAB — COMPREHENSIVE METABOLIC PANEL
ALT: 22 U/L (ref 0–35)
AST: 34 U/L (ref 0–37)
Albumin: 3.9 g/dL (ref 3.5–5.2)
Alkaline Phosphatase: 103 U/L (ref 39–117)
Anion gap: 10 (ref 5–15)
BUN: 20 mg/dL (ref 6–23)
CO2: 26 mEq/L (ref 19–32)
Calcium: 9.5 mg/dL (ref 8.4–10.5)
Chloride: 103 mEq/L (ref 96–112)
Creatinine, Ser: 0.67 mg/dL (ref 0.50–1.10)
GFR calc Af Amer: >90 mL/min (ref >90)
GFR calc non Af Amer: 88 mL/min — ABNORMAL LOW (ref >90)
Glucose, Bld: 97 mg/dL (ref 70–99)
Potassium: 4.5 mEq/L (ref 3.7–5.3)
Sodium: 139 mEq/L (ref 137–147)
Total Bilirubin: 0.3 mg/dL (ref 0.3–1.2)
Total Protein: 6.9 g/dL (ref 6.0–8.3)

## 2014-07-10 LAB — URINALYSIS, ROUTINE W REFLEX MICROSCOPIC
Bilirubin Urine: NEGATIVE
Glucose, UA: NEGATIVE mg/dL
Hgb urine dipstick: NEGATIVE
Ketones, ur: NEGATIVE mg/dL
Leukocytes, UA: NEGATIVE
Nitrite: NEGATIVE
Protein, ur: NEGATIVE mg/dL
Specific Gravity, Urine: 1.008 (ref 1.005–1.030)
Urobilinogen, UA: 0.2 mg/dL (ref 0.0–1.0)
pH: 7 (ref 5.0–8.0)

## 2014-07-10 LAB — APTT: aPTT: 29 seconds (ref 24–37)

## 2014-07-10 LAB — SURGICAL PCR SCREEN
MRSA, PCR: NEGATIVE
Staphylococcus aureus: NEGATIVE

## 2014-07-10 NOTE — Pre-Procedure Instructions (Addendum)
07-10-14 EKG 2'15, CXR 11'14 - Epic. Clearance note(04-09-14) Dr. Claiborne Billings with chart. Rt. Hip Xray done today.

## 2014-07-10 NOTE — Patient Instructions (Addendum)
20 Terri Rodriguez  07/10/2014   Your procedure is scheduled on: 8-12 -2015 Wednesday  Enter through [facility] Entrance and follow signs to [facility]. Arrive at   1130     AM.  Call this number if you have problems the morning of surgery: [phone]  Or Presurgical Testing [phone](Terri Rodriguez) For Living Will and/or Health Care Power Attorney Forms: please provide copy for your medical record,may bring AM of surgery(Forms should be already notarized -we do not provide this service).(07-10-14 Yes/ No information preferred today).  For Cpap use: Bring mask and tubing only.   Do not eat food:After Midnight.  May have clear liquids:up to 6 Hours before arrival. Nothing after : 0800 AM.  Clear liquids include soda, tea, black coffee, apple or grape juice, broth.  Take these medicines the morning of surgery with A SIP OF WATER: Carvedilol.  Zegerid. Tylenol,Valacyclovir(if needed).   Do not wear jewelry, make-up or nail polish.  Do not wear lotions, powders, or perfumes. You may wear deodorant.  Do not shave 48 hours(2 days) prior to first CHG shower(legs and under arms).(Shaving face and neck okay.)  Do not bring valuables to the hospital.(Hospital is not responsible for lost valuables).  Contacts, dentures or removable bridgework, body piercing, hair pins may not be worn into surgery.  Leave suitcase in the car. After surgery it may be brought to your room.  For patients admitted to the hospital, checkout time is 11:00 AM the day of discharge.(Restricted visitors-Any Persons displaying flu-like symptoms or illness).    Patients discharged the day of surgery will not be allowed to drive home. Must have responsible person with you x 24 hours once discharged.  Name and phone number of your driver: Terri Rodriguez [phone] h  Special Instructions: CHG(Chlorhedine 4%-"Hibiclens","Betasept","Aplicare") Shower Use Special Wash: see special instructions.(avoid face and  genitals)   Please read over the following fact sheets that you were given: MRSA Information, Blood Transfusion fact sheet, Incentive Spirometry Instruction.  Remember : Type/Screen "Blue armbands" - may not be removed once applied(would result in being retested AM of surgery, if removed).     __________________________    [facility] - Preparing for Surgery Before surgery, you can play an important role.  Because skin is not sterile, your skin needs to be as free of germs as possible.  You can reduce the number of germs on your skin by washing with CHG (chlorahexidine gluconate) soap before surgery.  CHG is an antiseptic cleaner which kills germs and bonds with the skin to continue killing germs even after washing. Please DO NOT use if you have an allergy to CHG or antibacterial soaps.  If your skin becomes reddened/irritated stop using the CHG and inform your nurse when you arrive at Short Stay. Do not shave (including legs and underarms) for at least 48 hours prior to the first CHG shower.  You may shave your face/neck. Please follow these instructions carefully:  1.  Shower with CHG Soap the night before surgery and the  morning of Surgery.  2.  If you choose to wash your hair, wash your hair first as usual with your  normal  shampoo.  3.  After you shampoo, rinse your hair and body thoroughly to remove the  shampoo.                           4.  Use CHG as you would any other liquid soap.  You can apply chg directly  to the skin and wash                       Gently with a scrungie or clean washcloth.  5.  Apply the CHG Soap to your body ONLY FROM THE NECK DOWN.   Do not use on face/ open                           Wound or open sores. Avoid contact with eyes, ears mouth and genitals (private parts).                       Wash face,  Genitals (private parts) with your normal soap.             6.  Wash thoroughly, paying special attention to the area where your surgery  will be  performed.  7.  Thoroughly rinse your body with warm water from the neck down.  8.  DO NOT shower/wash with your normal soap after using and rinsing off  the CHG Soap.                9.  Pat yourself dry with a clean towel.            10.  Wear clean pajamas.            11.  Place clean sheets on your bed the night of your first shower and do not  sleep with pets. Day of Surgery : Do not apply any lotions/deodorants the morning of surgery.  Please wear clean clothes to the hospital/surgery center.  FAILURE TO FOLLOW THESE INSTRUCTIONS MAY RESULT IN THE CANCELLATION OF YOUR SURGERY PATIENT SIGNATURE_________________________________  NURSE SIGNATURE__________________________________  ________________________________________________________________________   Terri Rodriguez  An incentive spirometer is a tool that can help keep your lungs clear and active. This tool measures how well you are filling your lungs with each breath. Taking long deep breaths may help reverse or decrease the chance of developing breathing (pulmonary) problems (especially infection) following:  A long period of time when you are unable to move or be active. BEFORE THE PROCEDURE   If the spirometer includes an indicator to show your best effort, your nurse or respiratory therapist will set it to a desired goal.  If possible, sit up straight or lean slightly forward. Try not to slouch.  Hold the incentive spirometer in an upright position. INSTRUCTIONS FOR USE  1. Sit on the edge of your bed if possible, or sit up as far as you can in bed or on a chair. 2. Hold the incentive spirometer in an upright position. 3. Breathe out normally. 4. Place the mouthpiece in your mouth and seal your lips tightly around it. 5. Breathe in slowly and as deeply as possible, raising the piston or the ball toward the top of the column. 6. Hold your breath for 3-5 seconds or for as long as possible. Allow the piston or ball to  fall to the bottom of the column. 7. Remove the mouthpiece from your mouth and breathe out normally. 8. Rest for a few seconds and repeat Steps 1 through 7 at least 10 times every 1-2 hours when you are awake. Take your time and take a few normal breaths between deep breaths. 9. The spirometer may include an indicator to show your best effort. Use the indicator as a goal to work  toward during each repetition. 10. After each set of 10 deep breaths, practice coughing to be sure your lungs are clear. If you have an incision (the cut made at the time of surgery), support your incision when coughing by placing a pillow or rolled up towels firmly against it. Once you are able to get out of bed, walk around indoors and cough well. You may stop using the incentive spirometer when instructed by your caregiver.  RISKS AND COMPLICATIONS  Take your time so you do not get dizzy or light-headed.  If you are in pain, you may need to take or ask for pain medication before doing incentive spirometry. It is harder to take a deep breath if you are having pain. AFTER USE  Rest and breathe slowly and easily.  It can be helpful to keep track of a log of your progress. Your caregiver can provide you with a simple table to help with this. If you are using the spirometer at home, follow these instructions: [place] IF:   You are having difficultly using the spirometer.  You have trouble using the spirometer as often as instructed.  Your pain medication is not giving enough relief while using the spirometer.  You develop fever of 100.5 F (38.1 C) or higher. SEEK IMMEDIATE MEDICAL CARE IF:   You cough up bloody sputum that had not been present before.  You develop fever of 102 F (38.9 C) or greater.  You develop worsening pain at or near the incision site. MAKE SURE YOU:   Understand these instructions.  Will watch your condition.  Will get help right away if you are not doing well or get  worse. Document Released: 04/03/2007 Document Revised: 02/13/2012 Document Reviewed: 06/04/2007 ExitCare Patient Information 2014 ExitCare, [place].   ________________________________________________________________________  WHAT IS A BLOOD TRANSFUSION? Blood Transfusion Information  A transfusion is the replacement of blood or some of its parts. Blood is made up of multiple cells which provide different functions.  Red blood cells carry oxygen and are used for blood loss replacement.  White blood cells fight against infection.  Platelets control bleeding.  Plasma helps clot blood.  Other blood products are available for specialized needs, such as hemophilia or other clotting disorders. BEFORE THE TRANSFUSION  Who gives blood for transfusions?   Healthy volunteers who are fully evaluated to make sure their blood is safe. This is blood bank blood. Transfusion therapy is the safest it has ever been in the practice of medicine. Before blood is taken from a donor, a complete history is taken to make sure that person has no history of diseases nor engages in risky social behavior (examples are intravenous drug use or sexual activity with multiple partners). The donor's travel history is screened to minimize risk of transmitting infections, such as malaria. The donated blood is tested for signs of infectious diseases, such as HIV and hepatitis. The blood is then tested to be sure it is compatible with you in order to minimize the chance of a transfusion reaction. If you or a relative donates blood, this is often done in anticipation of surgery and is not appropriate for emergency situations. It takes many days to process the donated blood. RISKS AND COMPLICATIONS Although transfusion therapy is very safe and saves many lives, the main dangers of transfusion include:   Getting an infectious disease.  Developing a transfusion reaction. This is an allergic reaction to something in the blood you  were given. Every precaution is taken  to prevent this. The decision to have a blood transfusion has been considered carefully by your caregiver before blood is given. Blood is not given unless the benefits outweigh the risks. AFTER THE TRANSFUSION  Right after receiving a blood transfusion, you will usually feel much better and more energetic. This is especially true if your red blood cells have gotten low (anemic). The transfusion raises the level of the red blood cells which carry oxygen, and this usually causes an energy increase.  The nurse administering the transfusion will monitor you carefully for complications. HOME CARE INSTRUCTIONS  No special instructions are needed after a transfusion. You may find your energy is better. Speak with your caregiver about any limitations on activity for underlying diseases you may have. SEEK MEDICAL CARE IF:   Your condition is not improving after your transfusion.  You develop redness or irritation at the intravenous (IV) site. SEEK IMMEDIATE MEDICAL CARE IF:  Any of the following symptoms occur over the next 12 hours:  Shaking chills.  You have a temperature by mouth above 102 F (38.9 C), not controlled by medicine.  Chest, back, or muscle pain.  People around you feel you are not acting correctly or are confused.  Shortness of breath or difficulty breathing.  Dizziness and fainting.  You get a rash or develop hives.  You have a decrease in urine output.  Your urine turns a dark color or changes to pink, red, or brown. Any of the following symptoms occur over the next 10 days:  You have a temperature by mouth above 102 F (38.9 C), not controlled by medicine.  Shortness of breath.  Weakness after normal activity.  The white part of the eye turns yellow (jaundice).  You have a decrease in the amount of urine or are urinating less often.  Your urine turns a dark color or changes to pink, red, or brown. Document Released:  11/18/2000 Document Revised: 02/13/2012 Document Reviewed: 07/07/2008 [facility] Patient Information 2014 [place], [place].  _______________________________________________________________________

## 2014-07-15 ENCOUNTER — Other Ambulatory Visit: Payer: Self-pay | Admitting: Orthopedic Surgery

## 2014-07-15 NOTE — H&P (Signed)
Terri Piccolo Lovena Le Md DOB: Apr 05, 1946 Married / Language: English / Race: White Female Date of Admission:  07-16-2014 Chief Complaint:  Right Hip Pain History of Present Illness The patient is a 68 year old female who comes in for a preoperative History and Physical. The patient is scheduled for a right total hip arthroplasty to be performed by Dr. Dione Plover. Aluisio, MD at [facility] on 07/16/2014. The patient is a 68 year old female presenting for a post-operative visit. The patient comes in today 5 months out from left total hip arthroplasty. The patient states that he/she is doing very well at this time. The pain is under excellent control at this time and describes their pain as 0 / 10 on a 10 point pain scale. They are currently on no medication (for the hip -she does take celebrex and occasionally tylenol for general oa) for their pain. The patient feels that they are progressing well at this time. Her left foot is doing fantastic at this point. Unfortunately, the right hip is getting progressively worse. She has developed pain in the right, similar to what she had been experiencing on the left, prior to her left hip replacement. The pain is in the groin radiating down the anterior thigh. Also occurring in the lateral thigh. She is losing some movement. The pain is affecting her throughout the day, even starting to bother her at night some too. She has done well with the left side and now is ready to proceed with the right side at this time. They have been treated conservatively in the past for the above stated problem and despite conservative measures, they continue to have progressive pain and severe functional limitations and dysfunction. They have failed non-operative management including home exercise, medications. It is felt that they would benefit from undergoing total joint replacement. Risks and benefits of the procedure have been discussed with the patient and they elect to proceed with  surgery. There are no active contraindications to surgery such as ongoing infection or rapidly progressive neurological disease.  Allergies  NKDA  Intolerances Percocet *ANALGESICS - OPIOID* Nausea. 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 Rheumatoid Arthritis, Diabetes, GI Bleeds, Cauda Equina Syndrome; Age 65 Father Deceased, Deep vein thrombosis. Pulmonary emboli; Age 52  Social History  Alcohol use Drinks wine. Living situation Lives with spouse. Current work status Retired. Internal Medicine Physician Tobacco use Never smoker. Marital status Married.  Medication History  Aspirin Childrens (81MG  Tablet Chewable, Oral) Active. Vitamin D (Oral) Specific dose unknown - Active. Gaviscon Extra Relief Formula (Oral) Specific dose unknown - Active. Vivelle-Dot (Transdermal) Specific dose unknown - Active. Centrum Silver Ultra Womens (Oral) Specific dose unknown - Active. Allegra Allergy (Oral) Specific dose unknown -  Active. Cymbalta (Oral) Specific dose unknown - Active. Metrogel (External)  Specific dose unknown - Active. Finacea (External) Specific dose unknown - Active. AmLODIPine Besylate (5MG  Tablet, Oral) Active. Atorvastatin Calcium (20MG  Tablet, Oral) Active. Carvedilol (3.125MG  Tablet, Oral) Active. Ramipril (10MG  Capsule, Oral) Active. CeleBREX (200MG  Capsule, Oral) Active. Zegerid (40-1100MG  Capsule, Oral) Active.  Past Surgical History  Arthroscopic Knee Surgery - Left Date: 08/2010. Cesarean Section - 1 Date: 50. Septoplasty Date: 05/2000. Spinal Fusion - Lower Back Date: 02/06/2008. L3-4,L4-5 Tonsillectomy/Adenoidectomy Date: 65. Hysterectomy; Total Date: 56. Arthroscopic Knee Surgery - Right Date: 05/2005. Hip Replacement, Total11/17/2014 Left. Left Knee Arthoscopy Date: 08/2010. Total Knee Replacement - Both Date: 09/2011. ORIF Left Distal Radius Date: 03/2014. Cardica Cath May 2003, December 2008   Review of Systems  General Not Present- Chills, Fatigue, Fever, Memory Loss, Night Sweats, Weight Gain and Weight Loss. Skin Not Present- Eczema, Hives, Itching, Lesions and Rash. HEENT Not Present- Dentures, Double Vision, Headache, Hearing Loss, Tinnitus and Visual Loss. Respiratory Not Present- Allergies, Chronic Cough, Coughing up blood, Shortness of breath at rest and Shortness of breath with exertion. Cardiovascular Not Present- Chest Pain, Difficulty Breathing Lying Down, Murmur, Palpitations, Racing/skipping heartbeats and Swelling. Gastrointestinal Not Present- Abdominal Pain, Bloody Stool, Constipation, Diarrhea, Difficulty Swallowing, Heartburn, Jaundice, Loss of appetitie, Nausea and Vomiting. Female Genitourinary Not Present- Blood in Urine, Discharge, Flank Pain, Incontinence, Painful Urination, Urgency, Urinary frequency, Urinary Retention, Urinating at Night and Weak urinary stream. Musculoskeletal Present- Joint Pain. Not Present- Back Pain, Joint  Swelling, Morning Stiffness, Muscle Pain, Muscle Weakness and Spasms. Neurological Not Present- Blackout spells, Difficulty with balance, Dizziness, Paralysis, Tremor and Weakness. Psychiatric Not Present- Insomnia.   Vitals  Height: 66in Pulse: 68 (Regular)  BP: 124/70 (Sitting, Right Arm, Standard)    Physical Exam  General Mental Status -Alert, cooperative and good historian. General Appearance-pleasant, Not in acute distress. Orientation-Oriented X3. Build & Nutrition-Well nourished and Well developed.  Head and Neck Head-normocephalic, atraumatic . Neck Global Assessment - supple, no bruit auscultated on the right, no bruit auscultated on the left.  Eye Pupil - Bilateral-Regular and Round. Motion - Bilateral-EOMI.  Chest and Lung Exam Auscultation Breath sounds - clear at anterior chest wall and clear at posterior chest wall. Adventitious sounds - No Adventitious sounds.  Cardiovascular Auscultation Rhythm - Regular rate and rhythm. Heart Sounds - S1 WNL and S2 WNL. Murmurs & Other Heart Sounds - Auscultation of the heart reveals - No Murmurs.  Abdomen Palpation/Percussion Tenderness - Abdomen is non-tender to palpation. Rigidity (guarding) - Abdomen is soft. Auscultation Auscultation of the abdomen reveals - Bowel sounds normal.  Female Genitourinary Note: Not done, not pertinent to present illness   Musculoskeletal Note: Musculoskeletal: Her left hip can be flexed to 120, rotated in 30, out 40, abduct 40 without discomfort. Right hip flexion to about 100, minimal internal rotation, about 20 external rotation, 20 abduction with pain.  X-RAYS: AP pelvis lateral of both hips show the prosthesis on the left in excellent position. No periprosthetic abnormalities. On the right, she has significant joint space narrowing, just about bone on bone with some subchondral cyst. Her x-ray from today is compared to a year ago and she has lost the almost  3 mm of joint space on the right side, in the past year.   Assessment & Plan Osteoarthritis of right hip, unspecified osteoarthritis type (715.95  M16.11) Note:Plan is for a Right Total Hip Replacement - anterior approach by Dr. Wynelle Link.  Plan is to go home.  PCP - Dr. Regis Bill Card. - Dr. Claiborne Billings  The patient  does not have any contraindications and will receive TXA (tranexamic acid) prior to surgery.  Signed electronically by Ok Edwards, III PA-C

## 2014-07-16 ENCOUNTER — Encounter (HOSPITAL_COMMUNITY): Payer: Self-pay | Admitting: *Deleted

## 2014-07-16 ENCOUNTER — Inpatient Hospital Stay (HOSPITAL_COMMUNITY): Payer: Medicare Other | Admitting: Certified Registered Nurse Anesthetist

## 2014-07-16 ENCOUNTER — Encounter (HOSPITAL_COMMUNITY): Payer: Medicare Other | Admitting: Certified Registered Nurse Anesthetist

## 2014-07-16 ENCOUNTER — Inpatient Hospital Stay (HOSPITAL_COMMUNITY): Payer: Medicare Other

## 2014-07-16 ENCOUNTER — Encounter (HOSPITAL_COMMUNITY)
Admission: RE | Disposition: A | Discharge status: Home Health | Payer: Self-pay | Source: Ambulatory Visit | Attending: Orthopedic Surgery

## 2014-07-16 ENCOUNTER — Inpatient Hospital Stay (HOSPITAL_COMMUNITY)
Admission: RE | Admit: 2014-07-16 | Discharge: 2014-07-18 | DRG: 470 | Disposition: A | Discharge status: Home Health | Payer: Medicare Other | Source: Ambulatory Visit | Attending: Orthopedic Surgery | Admitting: Orthopedic Surgery

## 2014-07-16 DIAGNOSIS — J309 Allergic rhinitis, unspecified: Secondary | ICD-10-CM | POA: Diagnosis present

## 2014-07-16 DIAGNOSIS — K219 Gastro-esophageal reflux disease without esophagitis: Secondary | ICD-10-CM | POA: Diagnosis present

## 2014-07-16 DIAGNOSIS — Z833 Family history of diabetes mellitus: Secondary | ICD-10-CM | POA: Diagnosis not present

## 2014-07-16 DIAGNOSIS — M1610 Unilateral primary osteoarthritis, unspecified hip: Principal | ICD-10-CM | POA: Diagnosis present

## 2014-07-16 DIAGNOSIS — Z6838 Body mass index (BMI) 38.0-38.9, adult: Secondary | ICD-10-CM

## 2014-07-16 DIAGNOSIS — E871 Hypo-osmolality and hyponatremia: Secondary | ICD-10-CM | POA: Diagnosis not present

## 2014-07-16 DIAGNOSIS — K449 Diaphragmatic hernia without obstruction or gangrene: Secondary | ICD-10-CM | POA: Diagnosis present

## 2014-07-16 DIAGNOSIS — G4733 Obstructive sleep apnea (adult) (pediatric): Secondary | ICD-10-CM | POA: Diagnosis present

## 2014-07-16 DIAGNOSIS — Z860100 Personal history of colon polyps, unspecified: Secondary | ICD-10-CM

## 2014-07-16 DIAGNOSIS — E785 Hyperlipidemia, unspecified: Secondary | ICD-10-CM | POA: Diagnosis present

## 2014-07-16 DIAGNOSIS — M169 Osteoarthritis of hip, unspecified: Secondary | ICD-10-CM | POA: Diagnosis present

## 2014-07-16 DIAGNOSIS — Z981 Arthrodesis status: Secondary | ICD-10-CM

## 2014-07-16 DIAGNOSIS — Z8249 Family history of ischemic heart disease and other diseases of the circulatory system: Secondary | ICD-10-CM | POA: Diagnosis not present

## 2014-07-16 DIAGNOSIS — D62 Acute posthemorrhagic anemia: Secondary | ICD-10-CM

## 2014-07-16 DIAGNOSIS — Z96659 Presence of unspecified artificial knee joint: Secondary | ICD-10-CM

## 2014-07-16 DIAGNOSIS — Z96641 Presence of right artificial hip joint: Secondary | ICD-10-CM

## 2014-07-16 DIAGNOSIS — M1611 Unilateral primary osteoarthritis, right hip: Secondary | ICD-10-CM

## 2014-07-16 DIAGNOSIS — M25559 Pain in unspecified hip: Secondary | ICD-10-CM | POA: Diagnosis present

## 2014-07-16 HISTORY — PX: TOTAL HIP ARTHROPLASTY: SHX124

## 2014-07-16 LAB — TYPE AND SCREEN
ABO/RH(D): O NEG
Antibody Screen: NEGATIVE

## 2014-07-16 SURGERY — ARTHROPLASTY, HIP, TOTAL, ANTERIOR APPROACH
Anesthesia: General | Site: Hip | Laterality: Right

## 2014-07-16 MED ORDER — LACTATED RINGERS IV SOLN
INTRAVENOUS | Status: DC
  Administered 2014-07-16 (×3): via INTRAVENOUS

## 2014-07-16 MED ORDER — ATORVASTATIN CALCIUM 20 MG PO TABS
20.0000 mg | ORAL_TABLET | Freq: Every evening | ORAL | Status: DC
  Administered 2014-07-17: 20 mg via ORAL
  Filled 2014-07-16 (×3): qty 1

## 2014-07-16 MED ORDER — HYDROMORPHONE HCL PF 1 MG/ML IJ SOLN
INTRAMUSCULAR | Status: AC
  Filled 2014-07-16: qty 1

## 2014-07-16 MED ORDER — MIDAZOLAM HCL 5 MG/5ML IJ SOLN
INTRAMUSCULAR | Status: DC | PRN
  Administered 2014-07-16 (×2): 1 mg via INTRAVENOUS

## 2014-07-16 MED ORDER — PROPOFOL 10 MG/ML IV BOLUS
INTRAVENOUS | Status: AC
  Filled 2014-07-16: qty 20

## 2014-07-16 MED ORDER — DOCUSATE SODIUM 100 MG PO CAPS
100.0000 mg | ORAL_CAPSULE | Freq: Two times a day (BID) | ORAL | Status: DC
  Administered 2014-07-17 – 2014-07-18 (×3): 100 mg via ORAL

## 2014-07-16 MED ORDER — SODIUM CHLORIDE 0.9 % IV SOLN
INTRAVENOUS | Status: DC
  Administered 2014-07-16: via INTRAVENOUS

## 2014-07-16 MED ORDER — ONDANSETRON HCL 4 MG/2ML IJ SOLN
INTRAMUSCULAR | Status: AC
  Filled 2014-07-16: qty 2

## 2014-07-16 MED ORDER — BUPIVACAINE HCL (PF) 0.25 % IJ SOLN
INTRAMUSCULAR | Status: AC
  Filled 2014-07-16: qty 30

## 2014-07-16 MED ORDER — PROMETHAZINE HCL 25 MG/ML IJ SOLN: 6.2500 mg | INTRAMUSCULAR | Status: DC | PRN

## 2014-07-16 MED ORDER — METOCLOPRAMIDE HCL 5 MG/ML IJ SOLN
INTRAMUSCULAR | Status: DC | PRN
  Administered 2014-07-16: 5 mg via INTRAVENOUS

## 2014-07-16 MED ORDER — NEOSTIGMINE METHYLSULFATE 10 MG/10ML IV SOLN
INTRAVENOUS | Status: DC | PRN
  Administered 2014-07-16: 2 mg via INTRAVENOUS

## 2014-07-16 MED ORDER — CEFAZOLIN SODIUM-DEXTROSE 2-3 GM-% IV SOLR
2.0000 g | Freq: Four times a day (QID) | INTRAVENOUS | Status: AC
  Administered 2014-07-16 – 2014-07-17 (×2): 2 g via INTRAVENOUS
  Filled 2014-07-16 (×2): qty 50

## 2014-07-16 MED ORDER — PANTOPRAZOLE SODIUM 40 MG PO TBEC
80.0000 mg | DELAYED_RELEASE_TABLET | Freq: Every day | ORAL | Status: DC
  Filled 2014-07-16: qty 2

## 2014-07-16 MED ORDER — ATROPINE SULFATE 0.4 MG/ML IJ SOLN
INTRAMUSCULAR | Status: AC
  Filled 2014-07-16: qty 1

## 2014-07-16 MED ORDER — 0.9 % SODIUM CHLORIDE (POUR BTL) OPTIME
TOPICAL | Status: DC | PRN
  Administered 2014-07-16: 1000 mL

## 2014-07-16 MED ORDER — RAMIPRIL 10 MG PO CAPS
10.0000 mg | ORAL_CAPSULE | Freq: Every morning | ORAL | Status: DC
  Administered 2014-07-17 – 2014-07-18 (×2): 10 mg via ORAL
  Filled 2014-07-16 (×3): qty 1

## 2014-07-16 MED ORDER — DULOXETINE HCL 60 MG PO CPEP
60.0000 mg | ORAL_CAPSULE | Freq: Every evening | ORAL | Status: DC
  Administered 2014-07-17: 60 mg via ORAL
  Filled 2014-07-16 (×3): qty 1

## 2014-07-16 MED ORDER — BUPIVACAINE HCL (PF) 0.25 % IJ SOLN
INTRAMUSCULAR | Status: DC | PRN
  Administered 2014-07-16: 20 mL

## 2014-07-16 MED ORDER — BISACODYL 10 MG RE SUPP: 10.0000 mg | Freq: Every day | RECTAL | Status: DC | PRN

## 2014-07-16 MED ORDER — DEXAMETHASONE SODIUM PHOSPHATE 10 MG/ML IJ SOLN: 10.0000 mg | Freq: Once | INTRAMUSCULAR | Status: DC

## 2014-07-16 MED ORDER — CHLORHEXIDINE GLUCONATE 4 % EX LIQD: 60.0000 mL | Freq: Once | CUTANEOUS | Status: DC

## 2014-07-16 MED ORDER — LIDOCAINE HCL (CARDIAC) 20 MG/ML IV SOLN
INTRAVENOUS | Status: DC | PRN
  Administered 2014-07-16: 75 mg via INTRAVENOUS

## 2014-07-16 MED ORDER — METOCLOPRAMIDE HCL 5 MG/ML IJ SOLN: 5.0000 mg | Freq: Three times a day (TID) | INTRAMUSCULAR | Status: DC | PRN

## 2014-07-16 MED ORDER — MIDAZOLAM HCL 2 MG/2ML IJ SOLN
INTRAMUSCULAR | Status: AC
  Filled 2014-07-16: qty 2

## 2014-07-16 MED ORDER — FENTANYL CITRATE 0.05 MG/ML IJ SOLN
INTRAMUSCULAR | Status: AC
  Filled 2014-07-16: qty 5

## 2014-07-16 MED ORDER — SODIUM CHLORIDE 0.9 % IV SOLN
1000.0000 mg | INTRAVENOUS | Status: AC
  Administered 2014-07-16: 1000 mg via INTRAVENOUS
  Filled 2014-07-16: qty 10

## 2014-07-16 MED ORDER — DEXAMETHASONE 6 MG PO TABS
10.0000 mg | ORAL_TABLET | Freq: Every day | ORAL | Status: AC
  Administered 2014-07-17: 10 mg via ORAL
  Filled 2014-07-16: qty 1

## 2014-07-16 MED ORDER — SUCCINYLCHOLINE CHLORIDE 20 MG/ML IJ SOLN
INTRAMUSCULAR | Status: DC | PRN
  Administered 2014-07-16: 100 mg via INTRAVENOUS

## 2014-07-16 MED ORDER — FLEET ENEMA 7-19 GM/118ML RE ENEM: 1.0000 | ENEMA | Freq: Once | RECTAL | Status: AC | PRN

## 2014-07-16 MED ORDER — SODIUM CHLORIDE 0.9 % IV SOLN: INTRAVENOUS | Status: DC

## 2014-07-16 MED ORDER — ACETAMINOPHEN 10 MG/ML IV SOLN
1000.0000 mg | Freq: Once | INTRAVENOUS | Status: AC
  Administered 2014-07-16: 1000 mg via INTRAVENOUS
  Filled 2014-07-16: qty 100

## 2014-07-16 MED ORDER — DIPHENHYDRAMINE HCL 12.5 MG/5ML PO ELIX: 12.5000 mg | ORAL_SOLUTION | ORAL | Status: DC | PRN

## 2014-07-16 MED ORDER — ONDANSETRON HCL 4 MG PO TABS: 4.0000 mg | ORAL_TABLET | Freq: Four times a day (QID) | ORAL | Status: DC | PRN

## 2014-07-16 MED ORDER — LIDOCAINE HCL (CARDIAC) 20 MG/ML IV SOLN
INTRAVENOUS | Status: AC
  Filled 2014-07-16: qty 5

## 2014-07-16 MED ORDER — NITROGLYCERIN 0.4 MG SL SUBL: 0.4000 mg | SUBLINGUAL_TABLET | SUBLINGUAL | Status: DC | PRN

## 2014-07-16 MED ORDER — HYDROMORPHONE HCL PF 1 MG/ML IJ SOLN: 0.5000 mg | INTRAMUSCULAR | Status: DC | PRN

## 2014-07-16 MED ORDER — METOCLOPRAMIDE HCL 5 MG PO TABS
5.0000 mg | ORAL_TABLET | Freq: Three times a day (TID) | ORAL | Status: DC | PRN
  Filled 2014-07-16: qty 2

## 2014-07-16 MED ORDER — ACETAMINOPHEN 650 MG RE SUPP: 650.0000 mg | Freq: Four times a day (QID) | RECTAL | Status: DC | PRN

## 2014-07-16 MED ORDER — METHOCARBAMOL 1000 MG/10ML IJ SOLN
500.0000 mg | Freq: Four times a day (QID) | INTRAVENOUS | Status: DC | PRN
  Administered 2014-07-16: 500 mg via INTRAVENOUS
  Filled 2014-07-16: qty 5

## 2014-07-16 MED ORDER — BUPIVACAINE LIPOSOME 1.3 % IJ SUSP
20.0000 mL | Freq: Once | INTRAMUSCULAR | Status: DC
  Filled 2014-07-16: qty 20

## 2014-07-16 MED ORDER — GLYCOPYRROLATE 0.2 MG/ML IJ SOLN
INTRAMUSCULAR | Status: DC | PRN
  Administered 2014-07-16: .1 mg via INTRAVENOUS
  Administered 2014-07-16: 0.2 mg via INTRAVENOUS

## 2014-07-16 MED ORDER — PROPOFOL 10 MG/ML IV BOLUS
INTRAVENOUS | Status: DC | PRN
  Administered 2014-07-16: 50 mg via INTRAVENOUS
  Administered 2014-07-16: 150 mg via INTRAVENOUS

## 2014-07-16 MED ORDER — DEXAMETHASONE SODIUM PHOSPHATE 10 MG/ML IJ SOLN
INTRAMUSCULAR | Status: DC | PRN
  Administered 2014-07-16: 10 mg via INTRAVENOUS

## 2014-07-16 MED ORDER — CEFAZOLIN SODIUM-DEXTROSE 2-3 GM-% IV SOLR
INTRAVENOUS | Status: AC
  Filled 2014-07-16: qty 50

## 2014-07-16 MED ORDER — FENTANYL CITRATE 0.05 MG/ML IJ SOLN
INTRAMUSCULAR | Status: DC | PRN
  Administered 2014-07-16: 50 ug via INTRAVENOUS
  Administered 2014-07-16: 100 ug via INTRAVENOUS
  Administered 2014-07-16 (×7): 50 ug via INTRAVENOUS

## 2014-07-16 MED ORDER — CARVEDILOL 3.125 MG PO TABS
3.1250 mg | ORAL_TABLET | Freq: Two times a day (BID) | ORAL | Status: DC
  Administered 2014-07-17 – 2014-07-18 (×3): 3.125 mg via ORAL
  Filled 2014-07-16 (×6): qty 1

## 2014-07-16 MED ORDER — DIPHENHYDRAMINE HCL 50 MG/ML IJ SOLN
12.5000 mg | Freq: Once | INTRAMUSCULAR | Status: AC
  Administered 2014-07-16: 12.5 mg via INTRAVENOUS

## 2014-07-16 MED ORDER — RIVAROXABAN 10 MG PO TABS
10.0000 mg | ORAL_TABLET | Freq: Every day | ORAL | Status: DC
  Administered 2014-07-17 – 2014-07-18 (×2): 10 mg via ORAL
  Filled 2014-07-16 (×3): qty 1

## 2014-07-16 MED ORDER — METHOCARBAMOL 500 MG PO TABS
500.0000 mg | ORAL_TABLET | Freq: Four times a day (QID) | ORAL | Status: DC | PRN
  Administered 2014-07-17 – 2014-07-18 (×3): 500 mg via ORAL
  Filled 2014-07-16 (×4): qty 1

## 2014-07-16 MED ORDER — POLYETHYLENE GLYCOL 3350 17 G PO PACK: 17.0000 g | PACK | Freq: Every day | ORAL | Status: DC | PRN

## 2014-07-16 MED ORDER — HYDROMORPHONE HCL 2 MG PO TABS
2.0000 mg | ORAL_TABLET | ORAL | Status: DC | PRN
  Administered 2014-07-17 – 2014-07-18 (×7): 2 mg via ORAL
  Filled 2014-07-16 (×7): qty 1

## 2014-07-16 MED ORDER — DEXAMETHASONE SODIUM PHOSPHATE 10 MG/ML IJ SOLN
INTRAMUSCULAR | Status: AC
  Filled 2014-07-16: qty 1

## 2014-07-16 MED ORDER — MENTHOL 3 MG MT LOZG
1.0000 | LOZENGE | OROMUCOSAL | Status: DC | PRN
Start: 2014-07-16 — End: 2014-07-18
  Filled 2014-07-16: qty 9

## 2014-07-16 MED ORDER — ACETAMINOPHEN 325 MG PO TABS: 650.0000 mg | ORAL_TABLET | Freq: Four times a day (QID) | ORAL | Status: DC | PRN

## 2014-07-16 MED ORDER — BUPIVACAINE LIPOSOME 1.3 % IJ SUSP
INTRAMUSCULAR | Status: DC | PRN
Start: 2014-07-16 — End: 2014-07-16
  Administered 2014-07-16: 20 mL

## 2014-07-16 MED ORDER — CISATRACURIUM BESYLATE (PF) 10 MG/5ML IV SOLN
INTRAVENOUS | Status: DC | PRN
  Administered 2014-07-16: 8 mg via INTRAVENOUS
  Administered 2014-07-16: 2 mg via INTRAVENOUS

## 2014-07-16 MED ORDER — ONDANSETRON HCL 4 MG/2ML IJ SOLN
INTRAMUSCULAR | Status: DC | PRN
  Administered 2014-07-16 (×2): 2 mg via INTRAVENOUS

## 2014-07-16 MED ORDER — ONDANSETRON HCL 4 MG/2ML IJ SOLN: 4.0000 mg | Freq: Four times a day (QID) | INTRAMUSCULAR | Status: DC | PRN

## 2014-07-16 MED ORDER — KETOROLAC TROMETHAMINE 15 MG/ML IJ SOLN: 7.5000 mg | Freq: Four times a day (QID) | INTRAMUSCULAR | Status: AC | PRN

## 2014-07-16 MED ORDER — HYDROMORPHONE HCL PF 1 MG/ML IJ SOLN
0.2500 mg | INTRAMUSCULAR | Status: DC | PRN
  Administered 2014-07-16 (×2): 0.5 mg via INTRAVENOUS

## 2014-07-16 MED ORDER — CEFAZOLIN SODIUM-DEXTROSE 2-3 GM-% IV SOLR
2.0000 g | INTRAVENOUS | Status: AC
  Administered 2014-07-16: 2 g via INTRAVENOUS

## 2014-07-16 MED ORDER — HYDROMORPHONE HCL PF 1 MG/ML IJ SOLN
0.2500 mg | INTRAMUSCULAR | Status: DC | PRN
  Administered 2014-07-16 (×2): 0.25 mg via INTRAVENOUS
  Administered 2014-07-16: 0.5 mg via INTRAVENOUS
  Administered 2014-07-16 (×2): 0.25 mg via INTRAVENOUS
  Administered 2014-07-16: 0.5 mg via INTRAVENOUS

## 2014-07-16 MED ORDER — SODIUM CHLORIDE 0.9 % IJ SOLN
INTRAMUSCULAR | Status: DC | PRN
  Administered 2014-07-16: 30 mL

## 2014-07-16 MED ORDER — DEXAMETHASONE SODIUM PHOSPHATE 10 MG/ML IJ SOLN
10.0000 mg | Freq: Every day | INTRAMUSCULAR | Status: AC
Start: 2014-07-17 — End: 2014-07-17
  Filled 2014-07-16: qty 1

## 2014-07-16 MED ORDER — AMLODIPINE BESYLATE 5 MG PO TABS
5.0000 mg | ORAL_TABLET | Freq: Every evening | ORAL | Status: DC
  Administered 2014-07-16 – 2014-07-17 (×2): 5 mg via ORAL
  Filled 2014-07-16 (×3): qty 1

## 2014-07-16 MED ORDER — DIPHENHYDRAMINE HCL 50 MG/ML IJ SOLN
INTRAMUSCULAR | Status: AC
  Filled 2014-07-16: qty 1

## 2014-07-16 MED ORDER — SODIUM CHLORIDE 0.9 % IJ SOLN
INTRAMUSCULAR | Status: AC
  Filled 2014-07-16: qty 50

## 2014-07-16 MED ORDER — PHENOL 1.4 % MT LIQD: 1.0000 | OROMUCOSAL | Status: DC | PRN

## 2014-07-16 MED ORDER — LORATADINE 10 MG PO TABS
10.0000 mg | ORAL_TABLET | Freq: Every day | ORAL | Status: DC
  Administered 2014-07-17: 10 mg via ORAL
  Filled 2014-07-16 (×3): qty 1

## 2014-07-16 MED ORDER — EPHEDRINE SULFATE 50 MG/ML IJ SOLN
INTRAMUSCULAR | Status: DC | PRN
  Administered 2014-07-16: 5 mg via INTRAVENOUS

## 2014-07-16 MED ORDER — ACETAMINOPHEN 500 MG PO TABS
1000.0000 mg | ORAL_TABLET | Freq: Four times a day (QID) | ORAL | Status: AC
  Administered 2014-07-16 – 2014-07-17 (×4): 1000 mg via ORAL
  Filled 2014-07-16 (×5): qty 2

## 2014-07-16 SURGICAL SUPPLY — 45 items
BAG SPEC THK2 15X12 ZIP CLS (MISCELLANEOUS)
BAG ZIPLOCK 12X15 (MISCELLANEOUS) IMPLANT
BLADE EXTENDED COATED 6.5IN (ELECTRODE) ×3 IMPLANT
BLADE SAG 18X100X1.27 (BLADE) ×3 IMPLANT
CAPT HIP PF COP ×2 IMPLANT
CLOSURE WOUND 1/2 X4 (GAUZE/BANDAGES/DRESSINGS) ×1
COVER PERINEAL POST (MISCELLANEOUS) ×3 IMPLANT
DECANTER SPIKE VIAL GLASS SM (MISCELLANEOUS) ×3 IMPLANT
DRAPE C-ARM 42X120 X-RAY (DRAPES) ×3 IMPLANT
DRAPE STERI IOBAN 125X83 (DRAPES) ×3 IMPLANT
DRAPE U-SHAPE 47X51 STRL (DRAPES) ×9 IMPLANT
DRSG ADAPTIC 3X8 NADH LF (GAUZE/BANDAGES/DRESSINGS) ×3 IMPLANT
DRSG MEPILEX BORDER 4X4 (GAUZE/BANDAGES/DRESSINGS) ×3 IMPLANT
DRSG MEPILEX BORDER 4X8 (GAUZE/BANDAGES/DRESSINGS) ×3 IMPLANT
DURAPREP 26ML APPLICATOR (WOUND CARE) ×3 IMPLANT
ELECT REM PT RETURN 9FT ADLT (ELECTROSURGICAL) ×3
ELECTRODE REM PT RTRN 9FT ADLT (ELECTROSURGICAL) ×1 IMPLANT
EVACUATOR 1/8 PVC DRAIN (DRAIN) ×3 IMPLANT
FACESHIELD WRAPAROUND (MASK) ×9 IMPLANT
FACESHIELD WRAPAROUND OR TEAM (MASK) ×4 IMPLANT
GAUZE SPONGE 4X4 12PLY STRL (GAUZE/BANDAGES/DRESSINGS) IMPLANT
GLOVE BIO SURGEON STRL SZ7.5 (GLOVE) ×3 IMPLANT
GLOVE BIO SURGEON STRL SZ8 (GLOVE) ×8 IMPLANT
GLOVE BIOGEL PI IND STRL 8 (GLOVE) ×2 IMPLANT
GLOVE BIOGEL PI IND STRL 8.5 (GLOVE) IMPLANT
GLOVE BIOGEL PI INDICATOR 8 (GLOVE) ×6
GLOVE BIOGEL PI INDICATOR 8.5 (GLOVE) ×2
GLOVE ECLIPSE 7.5 STRL STRAW (GLOVE) ×2 IMPLANT
GOWN SPEC L3 XXLG W/TWL (GOWN DISPOSABLE) ×2 IMPLANT
GOWN STRL REUS W/TWL LRG LVL3 (GOWN DISPOSABLE) ×3 IMPLANT
GOWN STRL REUS W/TWL XL LVL3 (GOWN DISPOSABLE) ×5 IMPLANT
KIT BASIN OR (CUSTOM PROCEDURE TRAY) ×3 IMPLANT
NDL SAFETY ECLIPSE 18X1.5 (NEEDLE) ×2 IMPLANT
NEEDLE HYPO 18GX1.5 SHARP (NEEDLE) ×6
PACK TOTAL JOINT (CUSTOM PROCEDURE TRAY) ×3 IMPLANT
STRIP CLOSURE SKIN 1/2X4 (GAUZE/BANDAGES/DRESSINGS) ×2 IMPLANT
SUT ETHIBOND NAB CT1 #1 30IN (SUTURE) ×3 IMPLANT
SUT MNCRL AB 4-0 PS2 18 (SUTURE) ×3 IMPLANT
SUT VIC AB 2-0 CT1 27 (SUTURE) ×6
SUT VIC AB 2-0 CT1 TAPERPNT 27 (SUTURE) ×2 IMPLANT
SUT VLOC 180 0 24IN GS25 (SUTURE) ×3 IMPLANT
SYRINGE 20CC LL (MISCELLANEOUS) ×3 IMPLANT
SYRINGE 60CC LL (MISCELLANEOUS) ×3 IMPLANT
TOWEL OR 17X26 10 PK STRL BLUE (TOWEL DISPOSABLE) ×3 IMPLANT
TRAY FOLEY CATH 14FRSI W/METER (CATHETERS) ×3 IMPLANT

## 2014-07-16 NOTE — Anesthesia Preprocedure Evaluation (Signed)
Anesthesia Evaluation  Patient identified by MRN, date of birth, ID band Patient awake    Reviewed: Allergy & Precautions, H&P , NPO status , Patient's Chart, lab work & pertinent test results  History of Anesthesia Complications (+) history of anesthetic complications  Airway Mallampati: II TM Distance: >3 FB Neck ROM: Full    Dental no notable dental hx.    Pulmonary sleep apnea ,  breath sounds clear to auscultation  Pulmonary exam normal       Cardiovascular + angina + CAD Rhythm:Regular Rate:Normal     Neuro/Psych  Neuromuscular disease negative psych ROS   GI/Hepatic Neg liver ROS, hiatal hernia, GERD-  Medicated,  Endo/Other  Morbid obesity  Renal/GU negative Renal ROS  negative genitourinary   Musculoskeletal negative musculoskeletal ROS (+)   Abdominal (+) + obese,   Peds negative pediatric ROS (+)  Hematology  (+) anemia ,   Anesthesia Other Findings   Reproductive/Obstetrics negative OB ROS                           Anesthesia Physical Anesthesia Plan  ASA: III  Anesthesia Plan: General   Post-op Pain Management:    Induction: Intravenous  Airway Management Planned: Oral ETT  Additional Equipment:   Intra-op Plan:   Post-operative Plan: Extubation in OR  Informed Consent: I have reviewed the patients History and Physical, chart, labs and discussed the procedure including the risks, benefits and alternatives for the proposed anesthesia with the patient or authorized representative who has indicated his/her understanding and acceptance.   Dental advisory given  Plan Discussed with: CRNA  Anesthesia Plan Comments:         Anesthesia Quick Evaluation

## 2014-07-16 NOTE — Anesthesia Postprocedure Evaluation (Signed)
  Anesthesia Post-op Note  Patient: Terri Rodriguez  Procedure(s) Performed: Procedure(s) (LRB): RIGHT TOTAL HIP ARTHROPLASTY ANTERIOR APPROACH (Right)  Patient Location: PACU  Anesthesia Type: General  Level of Consciousness: awake and alert   Airway and Oxygen Therapy: Patient Spontanous Breathing  Post-op Pain: mild  Post-op Assessment: Post-op Vital signs reviewed, Patient's Cardiovascular Status Stable, Respiratory Function Stable, Patent Airway and No signs of Nausea or vomiting  Last Vitals:  Filed Vitals:   07/16/14 1930  BP: 133/64  Pulse: 94  Temp: 36.8 C  Resp: 16    Post-op Vital Signs: stable   Complications: No apparent anesthesia complications

## 2014-07-16 NOTE — Progress Notes (Signed)
Pt placed on CPAP at 12 CMh2O per home setting with 2 LPM O2 bleed in via nasal pillows.  Pt tolerating well at this time, RT to monitor and assess as needed.

## 2014-07-16 NOTE — Transfer of Care (Signed)
Immediate Anesthesia Transfer of Care Note  Patient: Terri Rodriguez  Procedure(s) Performed: Procedure(s): RIGHT TOTAL HIP ARTHROPLASTY ANTERIOR APPROACH (Right)  Patient Location: PACU  Anesthesia Type:General  Level of Consciousness: awake, alert , oriented, patient cooperative and responds to stimulation  Airway & Oxygen Therapy: Patient Spontanous Breathing and Patient connected to face mask oxygen  Post-op Assessment: Report given to PACU RN, Post -op Vital signs reviewed and stable and Patient moving all extremities  Post vital signs: Reviewed and stable  Complications: No apparent anesthesia complications

## 2014-07-16 NOTE — Anesthesia Procedure Notes (Signed)
Procedure Name: Intubation Date/Time: 07/16/2014 2:50 PM Performed by: Ofilia Neas Pre-anesthesia Checklist: Patient identified, Timeout performed, Suction available, Emergency Drugs available and Patient being monitored Patient Re-evaluated:Patient Re-evaluated prior to inductionOxygen Delivery Method: Circle system utilized Preoxygenation: Pre-oxygenation with 100% oxygen Intubation Type: IV induction and Cricoid Pressure applied Ventilation: Mask ventilation without difficulty Laryngoscope Size: Mac and 4 Grade View: Grade III Tube type: Glide rite Tube size: 7.5 mm Number of attempts: 2 Airway Equipment and Method: Video-laryngoscopy Placement Confirmation: ETT inserted through vocal cords under direct vision,  positive ETCO2 and breath sounds checked- equal and bilateral Secured at: 21 cm Tube secured with: Tape Dental Injury: Teeth and Oropharynx as per pre-operative assessment  Difficulty Due To: Difficulty was anticipated, Difficult Airway- due to anterior larynx, Difficult Airway- due to limited oral opening, Difficult Airway- due to dentition, Difficult Airway- due to reduced neck mobility and Difficult Airway- due to immobile epiglottis Future Recommendations: Recommend- induction with short-acting agent, and alternative techniques readily available Comments: First attempt Mac 4: arytenoids visualized only with cricoid pressure, head lift. Anterior, small mouth, limited opening.  unable to expose cords.  To glidescope: cords visualized with cricoid pressure.  No trauma to structures.

## 2014-07-16 NOTE — Op Note (Signed)
OPERATIVE REPORT  PREOPERATIVE DIAGNOSIS: Osteoarthritis of the Right hip.   POSTOPERATIVE DIAGNOSIS: Osteoarthritis of the Right  hip.   PROCEDURE: Right total hip arthroplasty, anterior approach.   SURGEON: Gaynelle Arabian, MD   ASSISTANT: Arlee Muslim, PA-C  ANESTHESIA:  General  ESTIMATED BLOOD LOSS:- 700 ml   DRAINS: Hemovac x1.   COMPLICATIONS: None   CONDITION: PACU - hemodynamically stable.   BRIEF CLINICAL NOTE: Terri Rodriguez is a 68 y.o. female who has advanced end-  stage arthritis of his Right  hip with progressively worsening pain and  dysfunction.The patient has failed nonoperative management and presents for  total hip arthroplasty.   PROCEDURE IN DETAIL: After successful administration of spinal  anesthetic, the traction boots for the [facility] bed were placed on both  feet and the patient was placed onto the [facility] bed, boots placed into the leg  holders. The Right hip was then isolated from the perineum with plastic  drapes and prepped and draped in the usual sterile fashion. ASIS and  greater trochanter were marked and a oblique incision was made, starting  at about 1 cm lateral and 2 cm distal to the ASIS and coursing towards  the anterior cortex of the femur. The skin was cut with a 10 blade  through subcutaneous tissue to the level of the fascia overlying the  tensor fascia lata muscle. The fascia was then incised in line with the  incision at the junction of the anterior third and posterior 2/3rd. The  muscle was teased off the fascia and then the interval between the TFL  and the rectus was developed. The Hohmann retractor was then placed at  the top of the femoral neck over the capsule. The vessels overlying the  capsule were cauterized and the fat on top of the capsule was removed.  A Hohmann retractor was then placed anterior underneath the rectus  femoris to give exposure to the entire anterior capsule. A T-shaped  capsulotomy was performed.  The edges were tagged and the femoral head  was identified.       Osteophytes are removed off the superior acetabulum.  The femoral neck was then cut in situ with an oscillating saw. Traction  was then applied to the left lower extremity utilizing the [facility]  traction. The femoral head was then removed. Retractors were placed  around the acetabulum and then circumferential removal of the labrum was  performed. Osteophytes were also removed. Reaming starts at 45 mm to  medialize and  Increased in 2 mm increments to 49 mm. We reamed in  approximately 40 degrees of abduction, 20 degrees anteversion. A 50 mm  pinnacle acetabular shell was then impacted in anatomic position under  fluoroscopic guidance with excellent purchase. We did not need to place  any additional dome screws. A 32 mm neutral + 4 marathon liner was then  placed into the acetabular shell.       The femoral lift was then placed along the lateral aspect of the femur  just distal to the vastus ridge. The leg was  externally rotated and capsule  was stripped off the inferior aspect of the femoral neck down to the  level of the lesser trochanter, this was done with electrocautery. The femur was lifted after this was performed. The  leg was then placed and extended in adducted position to essentially delivering the femur. We also removed the capsule superiorly and the  piriformis from the  piriformis fossa to gain excellent exposure of the  proximal femur. Rongeur was used to remove some cancellous bone to get  into the lateral portion of the proximal femur for placement of the  initial starter reamer. The starter broaches was placed  the starter broach  and was shown to go down the center of the canal. Broaching  with the  Corail system was then performed starting at size 8, coursing  Up to size 10. A size 10 had excellent torsional and rotational  and axial stability. The trial high offset neck was then placed  with a 32 + 1 trial  head. The hip was then reduced. We confirmed that  the stem was in the canal both on AP and lateral x-rays. It also has excellent sizing. The hip was reduced with outstanding stability through full extension, full external rotation,  and then flexion in adduction internal rotation. AP pelvis was taken  and the leg lengths were measured and found to be within 3 mm of being equal. Hip  was then dislocated again and the femoral head and neck removed. The  femoral broach was removed. Size 10 Corail stem with a high offset  neck was then impacted into the femur following native anteversion. Has  excellent purchase in the canal. Excellent torsional and rotational and  axial stability. It is confirmed to be in the canal on AP and lateral  fluoroscopic views. The 32 + 1 ceramic head was placed and the hip  reduced with outstanding stability. Again AP pelvis was taken and it  confirmed that the leg lengths were equal. The wound was then copiously  irrigated with saline solution and the capsule reattached and repaired  with Ethibond suture.  20 mL of Exparel mixed with 50 mL of saline then additional 20 ml of .25% Bupivicaine injected into the capsule and into the edge of the tensor fascia lata as well as subcutaneous tissue. The fascia overlying the tensor fascia lata was  then closed with a running #1 V-Loc. Subcu was closed with interrupted  2-0 Vicryl and subcuticular running 4-0 Monocryl. Incision was cleaned  and dried. Steri-Strips and a bulky sterile dressing applied. Hemovac  drain was hooked to suction and then he was awakened and transported to  recovery in stable condition.        Please note that a surgical assistant was a medical necessity for this procedure to perform it in a safe and expeditious manner. Assistant was necessary to provide appropriate retraction of vital neurovascular structures and to prevent femoral fracture and allow for anatomic placement of the prosthesis.  Gaynelle Arabian, M.D.

## 2014-07-16 NOTE — Interval H&P Note (Signed)
History and Physical Interval Note:  07/16/2014 1:55 PM  Terri Rodriguez  has presented today for surgery, with the diagnosis of right hip osteoarthritis  The various methods of treatment have been discussed with the patient and family. After consideration of risks, benefits and other options for treatment, the patient has consented to  Procedure(s): RIGHT TOTAL HIP ARTHROPLASTY ANTERIOR APPROACH (Right) as a surgical intervention .  The patient's history has been reviewed, patient examined, no change in status, stable for surgery.  I have reviewed the patient's chart and labs.  Questions were answered to the patient's satisfaction.     [name]

## 2014-07-16 NOTE — H&P (View-Only) (Signed)
Terri Piccolo Lovena Le Md DOB: [birth year] Married / Language: English / Race: White Female Date of Admission:  07-16-2014 Chief Complaint:  Right Hip Pain History of Present Illness The patient is a 68 year old female who comes in for a preoperative History and Physical. The patient is scheduled for a right total hip arthroplasty to be performed by Dr. Dione Plover. Aluisio, MD at [facility] on 07/16/2014. The patient is a 69 year old female presenting for a post-operative visit. The patient comes in today 5 months out from left total hip arthroplasty. The patient states that he/she is doing very well at this time. The pain is under excellent control at this time and describes their pain as 0 / 10 on a 10 point pain scale. They are currently on no medication (for the hip -she does take celebrex and occasionally tylenol for general oa) for their pain. The patient feels that they are progressing well at this time. Her left foot is doing fantastic at this point. Unfortunately, the right hip is getting progressively worse. She has developed pain in the right, similar to what she had been experiencing on the left, prior to her left hip replacement. The pain is in the groin radiating down the anterior thigh. Also occurring in the lateral thigh. She is losing some movement. The pain is affecting her throughout the day, even starting to bother her at night some too. She has done well with the left side and now is ready to proceed with the right side at this time. They have been treated conservatively in the past for the above stated problem and despite conservative measures, they continue to have progressive pain and severe functional limitations and dysfunction. They have failed non-operative management including home exercise, medications. It is felt that they would benefit from undergoing total joint replacement. Risks and benefits of the procedure have been discussed with the patient and they elect to proceed with  surgery. There are no active contraindications to surgery such as ongoing infection or rapidly progressive neurological disease.  Allergies  NKDA  Intolerances Percocet *ANALGESICS - OPIOID* Nausea. 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 Rheumatoid Arthritis, Diabetes, GI Bleeds, Cauda Equina Syndrome; Age 78 Father Deceased, Deep vein thrombosis. Pulmonary emboli; Age 33  Social History  Alcohol use Drinks wine. Living situation Lives with spouse. Current work status Retired. Internal Medicine Physician Tobacco use Never smoker. Marital status Married.  Medication History  Aspirin Childrens (81MG  Tablet Chewable, Oral) Active. Vitamin D (Oral) Specific dose unknown - Active. Gaviscon Extra Relief Formula (Oral) Specific dose unknown - Active. Vivelle-Dot (Transdermal) Specific dose unknown - Active. Centrum Silver Ultra Womens (Oral) Specific dose unknown - Active. Allegra Allergy (Oral) Specific dose unknown -  Active. Cymbalta (Oral) Specific dose unknown - Active. Metrogel (External)  Specific dose unknown - Active. Finacea (External) Specific dose unknown - Active. AmLODIPine Besylate (5MG  Tablet, Oral) Active. Atorvastatin Calcium (20MG  Tablet, Oral) Active. Carvedilol (3.125MG  Tablet, Oral) Active. Ramipril (10MG  Capsule, Oral) Active. CeleBREX (200MG  Capsule, Oral) Active. Zegerid (40-1100MG  Capsule, Oral) Active.  Past Surgical History  Arthroscopic Knee Surgery - Left Date: 08/2010. Cesarean Section - 1 Date: 89. Septoplasty Date: 05/2000. Spinal Fusion - Lower Back Date: 02/06/2008. L3-4,L4-5 Tonsillectomy/Adenoidectomy Date: 48. Hysterectomy; Total Date: 37. Arthroscopic Knee Surgery - Right Date: 05/2005. Hip Replacement, Total11/17/2014 Left. Left Knee Arthoscopy Date: 08/2010. Total Knee Replacement - Both Date: 09/2011. ORIF Left Distal Radius Date: 03/2014. Cardica Cath May 2003, December 2008   Review of Systems  General Not Present- Chills, Fatigue, Fever, Memory Loss, Night Sweats, Weight Gain and Weight Loss. Skin Not Present- Eczema, Hives, Itching, Lesions and Rash. HEENT Not Present- Dentures, Double Vision, Headache, Hearing Loss, Tinnitus and Visual Loss. Respiratory Not Present- Allergies, Chronic Cough, Coughing up blood, Shortness of breath at rest and Shortness of breath with exertion. Cardiovascular Not Present- Chest Pain, Difficulty Breathing Lying Down, Murmur, Palpitations, Racing/skipping heartbeats and Swelling. Gastrointestinal Not Present- Abdominal Pain, Bloody Stool, Constipation, Diarrhea, Difficulty Swallowing, Heartburn, Jaundice, Loss of appetitie, Nausea and Vomiting. Female Genitourinary Not Present- Blood in Urine, Discharge, Flank Pain, Incontinence, Painful Urination, Urgency, Urinary frequency, Urinary Retention, Urinating at Night and Weak urinary stream. Musculoskeletal Present- Joint Pain. Not Present- Back Pain, Joint  Swelling, Morning Stiffness, Muscle Pain, Muscle Weakness and Spasms. Neurological Not Present- Blackout spells, Difficulty with balance, Dizziness, Paralysis, Tremor and Weakness. Psychiatric Not Present- Insomnia.   Vitals  Height: 66in Pulse: 68 (Regular)  BP: 124/70 (Sitting, Right Arm, Standard)    Physical Exam  General Mental Status -Alert, cooperative and good historian. General Appearance-pleasant, Not in acute distress. Orientation-Oriented X3. Build & Nutrition-Well nourished and Well developed.  Head and Neck Head-normocephalic, atraumatic . Neck Global Assessment - supple, no bruit auscultated on the right, no bruit auscultated on the left.  Eye Pupil - Bilateral-Regular and Round. Motion - Bilateral-EOMI.  Chest and Lung Exam Auscultation Breath sounds - clear at anterior chest wall and clear at posterior chest wall. Adventitious sounds - No Adventitious sounds.  Cardiovascular Auscultation Rhythm - Regular rate and rhythm. Heart Sounds - S1 WNL and S2 WNL. Murmurs & Other Heart Sounds - Auscultation of the heart reveals - No Murmurs.  Abdomen Palpation/Percussion Tenderness - Abdomen is non-tender to palpation. Rigidity (guarding) - Abdomen is soft. Auscultation Auscultation of the abdomen reveals - Bowel sounds normal.  Female Genitourinary Note: Not done, not pertinent to present illness   Musculoskeletal Note: Musculoskeletal: Her left hip can be flexed to 120, rotated in 30, out 40, abduct 40 without discomfort. Right hip flexion to about 100, minimal internal rotation, about 20 external rotation, 20 abduction with pain.  X-RAYS: AP pelvis lateral of both hips show the prosthesis on the left in excellent position. No periprosthetic abnormalities. On the right, she has significant joint space narrowing, just about bone on bone with some subchondral cyst. Her x-ray from today is compared to a year ago and she has lost the almost  3 mm of joint space on the right side, in the past year.   Assessment & Plan Osteoarthritis of right hip, unspecified osteoarthritis type (715.95  M16.11) Note:Plan is for a Right Total Hip Replacement - anterior approach by Dr. Wynelle Link.  Plan is to go home.  PCP - Dr. Regis Bill Card. - Dr. Claiborne Billings  The patient  does not have any contraindications and will receive TXA (tranexamic acid) prior to surgery.  Signed electronically by Ok Edwards, III PA-C

## 2014-07-16 NOTE — Progress Notes (Signed)
PACU note----pt continues to c/o pain; pain level 6 and is becoming increasingly intolerable; Dr. Delma Post notified, order rec'd and med given

## 2014-07-17 ENCOUNTER — Encounter (HOSPITAL_COMMUNITY): Payer: Self-pay | Admitting: Orthopedic Surgery

## 2014-07-17 DIAGNOSIS — E871 Hypo-osmolality and hyponatremia: Secondary | ICD-10-CM | POA: Diagnosis not present

## 2014-07-17 LAB — BASIC METABOLIC PANEL
Anion gap: 9 (ref 5–15)
BUN: 15 mg/dL (ref 6–23)
CO2: 27 mEq/L (ref 19–32)
Calcium: 8.6 mg/dL (ref 8.4–10.5)
Chloride: 100 mEq/L (ref 96–112)
Creatinine, Ser: 0.58 mg/dL (ref 0.50–1.10)
GFR calc Af Amer: >90 mL/min (ref >90)
GFR calc non Af Amer: >90 mL/min (ref >90)
Glucose, Bld: 163 mg/dL — ABNORMAL HIGH (ref 70–99)
Potassium: 4.2 mEq/L (ref 3.7–5.3)
Sodium: 136 mEq/L — ABNORMAL LOW (ref 137–147)

## 2014-07-17 LAB — CBC
HCT: 29.9 % — ABNORMAL LOW (ref 36.0–46.0)
Hemoglobin: 10.1 g/dL — ABNORMAL LOW (ref 12.0–15.0)
MCH: 30.7 pg (ref 26.0–34.0)
MCHC: 33.8 g/dL (ref 30.0–36.0)
MCV: 90.9 fL (ref 78.0–100.0)
Platelets: 200 10*3/uL (ref 150–400)
RBC: 3.29 MIL/uL — ABNORMAL LOW (ref 3.87–5.11)
RDW: 14.8 % (ref 11.5–15.5)
WBC: 7.5 10*3/uL (ref 4.0–10.5)

## 2014-07-17 MED ORDER — NON FORMULARY: 40.0000 mg | Freq: Every day | Status: DC

## 2014-07-17 MED ORDER — OMEPRAZOLE 20 MG PO CPDR
40.0000 mg | DELAYED_RELEASE_CAPSULE | Freq: Every day | ORAL | Status: DC
  Administered 2014-07-17 – 2014-07-18 (×2): 40 mg via ORAL
  Filled 2014-07-17 (×2): qty 2

## 2014-07-17 NOTE — Progress Notes (Signed)
   Subjective: 1 Day Post-Op Procedure(s) (LRB): RIGHT TOTAL HIP ARTHROPLASTY ANTERIOR APPROACH (Right) Patient reports pain as mild.   Patient seen in rounds with Dr. Wynelle Link. Patient is well, and has had no acute complaints or problems We will start therapy today.  Plan is to go Home after hospital stay.  Objective: Vital signs in last 24 hours: Temp:  [97.9 F (36.6 C)-98.8 F (37.1 C)] 98.3 F (36.8 C) (08/13 0510) Pulse Rate:  [65-94] 77 (08/13 0510) Resp:  [16-17] 16 (08/13 0510) BP: (122-157)/(52-73) 124/52 mmHg (08/13 0510) SpO2:  [93 %-100 %] 99 % (08/13 0510) Weight:  [111.131 kg (245 lb)] 111.131 kg (245 lb) (08/12 1145)  Intake/Output from previous day:  Intake/Output Summary (Last 24 hours) at 07/17/14 0746 Last data filed at 07/17/14 0630  Gross per 24 hour  Intake   2365 ml  Output   2370 ml  Net     -5 ml    Intake/Output this shift: UOP 950 since MN  Labs:  Recent Labs  07/17/14 0430  HGB 10.1*    Recent Labs  07/17/14 0430  WBC 7.5  RBC 3.29*  HCT 29.9*  PLT 200    Recent Labs  07/17/14 0430  NA 136*  K 4.2  CL 100  CO2 27  BUN 15  CREATININE 0.58  GLUCOSE 163*  CALCIUM 8.6   No results found for this basename: LABPT, INR,  in the last 72 hours  EXAM General - Patient is Alert, Appropriate and Oriented Extremity - Neurovascular intact Sensation intact distally Dressing - dressing C/D/I Motor Function - intact, moving foot and toes well on exam.  Hemovac pulled without difficulty.  Past Medical History  Diagnosis Date  . HYPERLIPIDEMIA   . UNSPECIFIED ANEMIA   . LEUKOPENIA, CHRONIC   . ALLERGIC RHINITIS   . GERD   . Rosacea     facial  . RAYNAUD'S SYNDROME, HX OF     improved after cardiac meds initiated  . Hx of adenomatous polyp of colon 06/05/08  . Hx of cardiac catheterization 2008     clean coronarys DrKelly   . Hx of chest pain     neg cath remot hx of narrowwing lad in 2003 nl 2008, none in many years  .  Nasal injury 06/14/2012  . Diverticulitis   . Hepatic hemangioma 06/09/08  . Mononucleosis 1963  . Subacute thyroiditis   . IBS (irritable bowel syndrome)   . Hiatal hernia 03/2000  . Cholelithiasis 06/09/08  . Anginal pain   . Complication of anesthesia 2012    went to ICU after bilateral knees ,? unstable vital signs  . SLEEP APNEA, OBSTRUCTIVE     cpap, settings "automatic" settings 11-12  . EPICONDYLITIS, LATERAL     osteoarthritis, previous joint replacements    Assessment/Plan: 1 Day Post-Op Procedure(s) (LRB): RIGHT TOTAL HIP ARTHROPLASTY ANTERIOR APPROACH (Right) Principal Problem:   OA (osteoarthritis) of hip Active Problems:   HYPERLIPIDEMIA   SLEEP APNEA, OBSTRUCTIVE   ALLERGIC RHINITIS   GERD   Hyponatremia  Estimated body mass index is 38.96 kg/(m^2) as calculated from the following:   Height as of this encounter: 5' 6.5" (1.689 m).   Weight as of this encounter: 111.131 kg (245 lb). Advance diet Up with therapy Plan for discharge tomorrow Discharge home with home health  DVT Prophylaxis - Xarelto Weight Bearing As Tolerated right Leg Hemovac Pulled Begin Therapy  Arlee Muslim, PA-C Orthopaedic Surgery 07/17/2014, 7:46 AM

## 2014-07-17 NOTE — Progress Notes (Signed)
Physical Therapy Treatment Patient Details Name: Terri Rodriguez MRN: [id number] DOB: January 22, 1946 Today's Date: 07/17/2014    History of Present Illness RDATHA    PT Comments    Pt is progressing well, posture during ambulation is improved.  Follow Up Recommendations  Home health PT;Supervision/Assistance - 24 hour     Equipment Recommendations  None recommended by PT    Recommendations for Other Services       Precautions / Restrictions Precautions Precautions: Fall Restrictions Weight Bearing Restrictions: No    Mobility  Bed Mobility Overal bed mobility: Needs Assistance Bed Mobility: Sit to Supine     Supine to sit: Min assist     General bed mobility comments: used leg lifter to get R leg onto bed, cues ofr technique.  Transfers Overall transfer level: Needs assistance Equipment used: Rolling walker (2 wheeled) Transfers: Sit to/from Stand Sit to Stand: Min guard         General transfer comment: verbal cues for hand placement and R LE  Ambulation/Gait Ambulation/Gait assistance: Min guard Ambulation Distance (Feet): 100 Feet Assistive device: Rolling walker (2 wheeled) Gait Pattern/deviations: Step-to pattern;Step-through pattern     General Gait Details: cues for sequence, posture, tends to be flexed trunk , improved from first session   Stairs            Wheelchair Mobility    Modified Rankin (Stroke Patients Only)       Balance                                    Cognition Arousal/Alertness: Awake/alert Behavior During Therapy: WFL for tasks assessed/performed Overall Cognitive Status: Within Functional Limits for tasks assessed                      Exercises      General Comments        Pertinent Vitals/Pain Pain Assessment: 0-10 Pain Score: 3  Pain Location: R hip/thigh Pain Descriptors / Indicators: Aching Pain Intervention(s): Repositioned;Ice applied    Home Living Family/patient expects  to be discharged to:: Private residence Living Arrangements: Spouse/significant other Available Help at Discharge: Family Type of Home: House Home Access: Stairs to enter Entrance Stairs-Rails: None Home Layout: Two level Home Equipment: Clinical cytogeneticist - 2 wheels;Cane - single point;Bedside commode;Adaptive equipment      Prior Function Level of Independence: Needs assistance  Gait / Transfers Assistance Needed: quite debilitated by Hip PTA, amb with RW       PT Goals (current goals can now be found in the care plan section) Acute Rehab PT Goals Patient Stated Goal: To walk without pain Progress towards PT goals: Progressing toward goals    Frequency  7X/week    PT Plan Current plan remains appropriate    Co-evaluation             End of Session   Activity Tolerance: Patient tolerated treatment well Patient left: in bed;with call bell/phone within reach;with family/visitor present     Time: 6712-4580 PT Time Calculation (min): 21 min  Charges:  $Gait Training: 8-22 mins                    G Codes:      Claretha Cooper 07/17/2014, 4:29 PM

## 2014-07-17 NOTE — Progress Notes (Signed)
CARE MANAGEMENT NOTE 07/17/2014  Patient:  Terri Rodriguez, Terri Rodriguez   Account Number:  [id number]  Date Initiated:  07/17/2014  Documentation initiated by:  Keirston Saephanh  Subjective/Objective Assessment:   rt total hip     Action/Plan:   home with hhc has all euipment needed in the home   Anticipated DC Date:  07/20/2014   Anticipated DC Plan:  [place] referral  NA      DC Planning Services  CM consult      [facility] Choice  NA   Choice offered to / List presented to:  C-1 Patient   DME arranged  NA      DME agency  NA     [place] arranged  HH-2 PT      [place]   Status of service:  In process, will continue to follow Medicare Important Message given?  NA - LOS <3 / Initial given by admissions (If response is "NO", the following Medicare IM given date fields will be blank) Date Medicare IM given:   Medicare IM given by:   Date Additional Medicare IM given:   Additional Medicare IM given by:    Discharge Disposition:    Per UR Regulation:  Reviewed for med. necessity/level of care/duration of stay  If discussed at [place] of Stay Meetings, dates discussed:    Comments:  Suanne Marker Mauria Asquith,Rn,BSN,CCM

## 2014-07-17 NOTE — Evaluation (Signed)
Physical Therapy Evaluation Patient Details Name: Terri Rodriguez MRN: [id number] DOB: Dec 13, 1945 Today's Date: 07/17/2014   History of Present Illness  RDATHA  Clinical Impression  Pt moved quite well and has min-mod pain. Pt will benefit from PT to address problems listed in note below.     Follow Up Recommendations Home health PT;Supervision/Assistance - 24 hour    Equipment Recommendations  None recommended by PT    Recommendations for Other Services       Precautions / Restrictions Precautions Precautions: Fall      Mobility  Bed Mobility Overal bed mobility: Needs Assistance Bed Mobility: Supine to Sit     Supine to sit: Min assist     General bed mobility comments: used sheet to slide R leg to edge of the bed, extra time to get self upright in bed.  Transfers Overall transfer level: Needs assistance Equipment used: Rolling walker (2 wheeled) Transfers: Sit to/from Stand Sit to Stand: Min assist         General transfer comment: cues for hand and R leg position.  Ambulation/Gait Ambulation/Gait assistance: Min assist Ambulation Distance (Feet): 50 Feet Assistive device: Rolling walker (2 wheeled) Gait Pattern/deviations: Step-to pattern;Antalgic     General Gait Details: cues for sequence, posture, tends to be flexe4d trunk which patient  reports  is how she walked PTA  Stairs            Wheelchair Mobility    Modified Rankin (Stroke Patients Only)       Balance                                             Pertinent Vitals/Pain Pain Assessment: 0-10 Pain Score: 6  Pain Descriptors / Indicators: Aching;Burning Pain Intervention(s): Ice applied;Repositioned;Premedicated before session    Home Living Family/patient expects to be discharged to:: Private residence Living Arrangements: Spouse/significant other Available Help at Discharge: Family Type of Home: House Home Access: Stairs to enter Entrance Stairs-Rails:  None Entrance Stairs-Number of Steps: 1 Home Layout: Two level Home Equipment: Toilet riser;Shower seat;Walker - 2 wheels;Cane - single point      Prior Function Level of Independence: Needs assistance   Gait / Transfers Assistance Needed: quite debilitated by Hip PTA, amb with RW           Hand Dominance        Extremity/Trunk Assessment   Upper Extremity Assessment: Overall WFL for tasks assessed           Lower Extremity Assessment: RLE deficits/detail RLE Deficits / Details: able to advance R leg during swing       Communication   Communication: No difficulties  Cognition Arousal/Alertness: Awake/alert Behavior During Therapy: WFL for tasks assessed/performed Overall Cognitive Status: Within Functional Limits for tasks assessed                      General Comments      Exercises Total Joint Exercises Ankle Circles/Pumps: AROM;Both;10 reps;Supine Short Arc Quad: AROM;Right;10 reps;Supine Heel Slides: AROM;Right;10 reps;Supine Hip ABduction/ADduction: AAROM;Right;10 reps;Supine      Assessment/Plan    PT Assessment Patient needs continued PT services  PT Diagnosis Difficulty walking;Acute pain   PT Problem List Decreased strength;Decreased range of motion;Decreased mobility;Decreased knowledge of precautions;Decreased safety awareness;Decreased knowledge of use of DME;Pain  PT Treatment Interventions DME instruction;Gait training;Stair training;Functional mobility training;Therapeutic activities;Therapeutic  exercise;Patient/family education   PT Goals (Current goals can be found in the Care Plan section) Acute Rehab PT Goals Patient Stated Goal: To walk without pain PT Goal Formulation: With patient/family Time For Goal Achievement: 07/24/14 Potential to Achieve Goals: Good    Frequency 7X/week   Barriers to discharge        Co-evaluation               End of Session   Activity Tolerance: Patient tolerated treatment  well Patient left: in chair;with call bell/phone within reach;with family/visitor present Nurse Communication: Mobility status         Time: 1041-1130 PT Time Calculation (min): 49 min   Charges:   PT Evaluation $Initial PT Evaluation Tier I: 1 Procedure PT Treatments $Gait Training: 8-22 mins $Therapeutic Exercise: 8-22 mins $Self Care/Home Management: 8-22   PT G Codes:          Claretha Cooper 07/17/2014, 11:50 AM Tresa Endo PT [phone]

## 2014-07-17 NOTE — Evaluation (Signed)
Occupational Therapy Evaluation Patient Details Name: Terri Rodriguez MRN: [id number] DOB: [birth year] Today's Date: 07/17/2014    History of Present Illness RDATHA   Clinical Impression   Pt up with walker to 3in1 for toileting practice with min assist. Will benefit from continued OT to progress safety and independence with self care tasks.    Follow Up Recommendations  No OT follow up;Supervision/Assistance - 24 hour    Equipment Recommendations  None recommended by OT    Recommendations for Other Services       Precautions / Restrictions Precautions Precautions: Fall Restrictions Weight Bearing Restrictions: No      Mobility Bed Mobility Overal bed mobility: Needs Assistance Bed Mobility: Supine to Sit     Supine to sit: Min assist     General bed mobility comments: used sheet to slide R leg to edge of the bed, extra time to get self upright in bed.  Transfers Overall transfer level: Needs assistance Equipment used: Rolling walker (2 wheeled) Transfers: Sit to/from Stand Sit to Stand: Min assist         General transfer comment: verbal cues for hand placement and R LE    Balance                                            ADL Overall ADL's : Needs assistance/impaired Eating/Feeding: Independent;Sitting   Grooming: Wash/dry hands;Minimal assistance;Standing   Upper Body Bathing: Set up;Sitting   Lower Body Bathing: Moderate assistance;Sit to/from stand   Upper Body Dressing : Set up;Sitting   Lower Body Dressing: Moderate assistance;Sit to/from stand   Toilet Transfer: Minimal assistance;Ambulation;BSC;RW   Toileting- Clothing Manipulation and Hygiene: Minimal assistance;Sit to/from stand         General ADL Comments: Pt very pleasant and motivated. She has most AE except LHS and discussed where to obtani. Discussed use of 3in1 as shower chair versus her shower seat with the advantage of 3in1 having armrests for use initially.  She states she feels comfortable with AE use and has husband available and reports using AE with previous surgeries. She does request to practice shower transfer before d/c. She needs cues for posture and safety with walker as she attempted to pick up walker and lift it over shower ledge. educated her on side stepping through tighter spaces.      Vision                     Perception     Praxis      Pertinent Vitals/Pain Pain Assessment: 0-10 Pain Score: 8  Pain Location: R hip Pain Descriptors / Indicators: Aching Pain Intervention(s): Repositioned;Ice applied     Hand Dominance     Extremity/Trunk Assessment Upper Extremity Assessment Upper Extremity Assessment: Overall WFL for tasks assessed   Lower Extremity Assessment Lower Extremity Assessment: RLE deficits/detail RLE Deficits / Details: able to advance R leg during swing       Communication Communication Communication: No difficulties   Cognition Arousal/Alertness: Awake/alert Behavior During Therapy: WFL for tasks assessed/performed Overall Cognitive Status: Within Functional Limits for tasks assessed                     General Comments       Exercises       Shoulder Instructions      Home Living Family/patient expects to be  discharged to:: Private residence Living Arrangements: Spouse/significant other Available Help at Discharge: Family Type of Home: House Home Access: Stairs to enter Technical brewer of Steps: 1 Entrance Stairs-Rails: None Home Layout: Two level Alternate Level Stairs-Number of Steps: 16 Alternate Level Stairs-Rails: Right Bathroom Shower/Tub: Occupational psychologist: Handicapped height     Home Equipment: Clinical cytogeneticist - 2 wheels;Cane - single point;Bedside commode;Adaptive equipment Adaptive Equipment: Reacher;Sock aid;Long-handled shoe horn        Prior Functioning/Environment Level of Independence: Needs assistance  Gait /  Transfers Assistance Needed: quite debilitated by Hip PTA, amb with RW          OT Diagnosis: Generalized weakness   OT Problem List: Decreased strength;Decreased knowledge of use of DME or AE   OT Treatment/Interventions: Self-care/ADL training;Patient/family education;Therapeutic activities;DME and/or AE instruction    OT Goals(Current goals can be found in the care plan section) Acute Rehab OT Goals Patient Stated Goal: To walk without pain OT Goal Formulation: With patient Time For Goal Achievement: 07/24/14 Potential to Achieve Goals: Good  OT Frequency: Min 2X/week   Barriers to D/C:            Co-evaluation              End of Session Equipment Utilized During Treatment: Gait belt;Rolling walker  Activity Tolerance: Patient tolerated treatment well Patient left: in chair;with call bell/phone within reach;with family/visitor present   Time: 1245-1320 OT Time Calculation (min): 35 min Charges:  OT General Charges $OT Visit: 1 Procedure OT Evaluation $Initial OT Evaluation Tier I: 1 Procedure OT Treatments $Self Care/Home Management : 8-22 mins $Therapeutic Activity: 8-22 mins G-Codes:    Jules Schick [phone] 07/17/2014, 2:23 PM

## 2014-07-18 LAB — BASIC METABOLIC PANEL
Anion gap: 12 (ref 5–15)
BUN: 15 mg/dL (ref 6–23)
CO2: 26 mEq/L (ref 19–32)
Calcium: 8.7 mg/dL (ref 8.4–10.5)
Chloride: 101 mEq/L (ref 96–112)
Creatinine, Ser: 0.57 mg/dL (ref 0.50–1.10)
GFR calc Af Amer: >90 mL/min (ref >90)
GFR calc non Af Amer: >90 mL/min (ref >90)
Glucose, Bld: 141 mg/dL — ABNORMAL HIGH (ref 70–99)
Potassium: 3.9 mEq/L (ref 3.7–5.3)
Sodium: 139 mEq/L (ref 137–147)

## 2014-07-18 LAB — CBC
HCT: 27.4 % — ABNORMAL LOW (ref 36.0–46.0)
Hemoglobin: 9.2 g/dL — ABNORMAL LOW (ref 12.0–15.0)
MCH: 31 pg (ref 26.0–34.0)
MCHC: 33.6 g/dL (ref 30.0–36.0)
MCV: 92.3 fL (ref 78.0–100.0)
Platelets: 189 10*3/uL (ref 150–400)
RBC: 2.97 MIL/uL — ABNORMAL LOW (ref 3.87–5.11)
RDW: 15.1 % (ref 11.5–15.5)
WBC: 9.3 10*3/uL (ref 4.0–10.5)

## 2014-07-18 MED ORDER — RIVAROXABAN 10 MG PO TABS: 10.0000 mg | ORAL_TABLET | Freq: Every day | ORAL | Status: DC

## 2014-07-18 MED ORDER — HYDROMORPHONE HCL 2 MG PO TABS: 2.0000 mg | ORAL_TABLET | ORAL | Status: DC | PRN

## 2014-07-18 MED ORDER — METHOCARBAMOL 500 MG PO TABS: 500.0000 mg | ORAL_TABLET | Freq: Four times a day (QID) | ORAL | Status: DC | PRN

## 2014-07-18 NOTE — Progress Notes (Signed)
Physical Therapy Treatment Patient Details Name: Terri Rodriguez MRN: [id number] DOB: 09/07/1946 Today's Date: 07/18/2014    History of Present Illness RDATHA    PT Comments    Pt reports increased thigh pain this PM> Pt will determine  About going upto second floor after getting home.  Follow Up Recommendations  Home health PT;Supervision/Assistance - 24 hour     Equipment Recommendations  None recommended by PT    Recommendations for Other Services       Precautions / Restrictions Precautions Precautions: Fall Restrictions Weight Bearing Restrictions: No    Mobility  Bed Mobility                  Transfers Overall transfer level: Needs assistance Equipment used: Rolling walker (2 wheeled) Transfers: Sit to/from Stand Sit to Stand: Min guard         General transfer comment: verbal cues for hand placement and R LE  Ambulation/Gait Ambulation/Gait assistance: Min guard Ambulation Distance (Feet): 50 Feet Assistive device: Rolling walker (2 wheeled) Gait Pattern/deviations: Step-to pattern     General Gait Details: decreased advancement R leg today,   Stairs Stairs: Yes Stairs assistance: Min assist Stair Management: One rail Left;Step to pattern;Forwards;With cane Number of Stairs: 5 General stair comments: cues for safety and for sequence, extra time to go down , decreased R leg control.  Wheelchair Mobility    Modified Rankin (Stroke Patients Only)       Balance                                    Cognition Arousal/Alertness: Awake/alert Behavior During Therapy: WFL for tasks assessed/performed Overall Cognitive Status: Within Functional Limits for tasks assessed                      Exercises      General Comments        Pertinent Vitals/Pain Pain Score: 8  Pain Location: R thigh Pain Descriptors / Indicators: Aching;Burning Pain Intervention(s): Limited activity within patient's tolerance;Monitored  during session;Repositioned;Ice applied    Home Living                      Prior Function            PT Goals (current goals can now be found in the care plan section) Progress towards PT goals: Progressing toward goals    Frequency  7X/week    PT Plan Current plan remains appropriate    Co-evaluation             End of Session Equipment Utilized During Treatment: Gait belt Activity Tolerance: Patient limited by fatigue;Patient limited by pain Patient left: in chair;with call bell/phone within reach;with family/visitor present     Time: 5366-4403 PT Time Calculation (min): 34 min  Charges:  $Gait Training: 23-37 mins                    G Codes:      Claretha Cooper 07/18/2014, 2:40 PM

## 2014-07-18 NOTE — Progress Notes (Signed)
Subjective: 2 Days Post-Op Procedure(s) (LRB): RIGHT TOTAL HIP ARTHROPLASTY ANTERIOR APPROACH (Right) Patient reports pain as mild and moderate.   Patient seen in rounds with Dr. Wynelle Link.  Doing better and some sleep last night. Patient is well, but has had some minor complaints of pain in the hip, requiring pain medications Patient is ready to go home today after therapy.  Objective: Vital signs in last 24 hours: Temp:  [98.3 F (36.8 C)-98.8 F (37.1 C)] 98.3 F (36.8 C) (08/14 0507) Pulse Rate:  [80-101] 80 (08/14 0507) Resp:  [15-18] 16 (08/14 0507) BP: (115-131)/(54-70) 131/54 mmHg (08/14 0507) SpO2:  [91 %-96 %] 96 % (08/14 0507)  Intake/Output from previous day:  Intake/Output Summary (Last 24 hours) at 07/18/14 0730 Last data filed at 07/18/14 0507  Gross per 24 hour  Intake 1633.5 ml  Output   1950 ml  Net -316.5 ml     Labs:  Recent Labs  07/17/14 0430 07/18/14 0436  HGB 10.1* 9.2*    Recent Labs  07/17/14 0430 07/18/14 0436  WBC 7.5 9.3  RBC 3.29* 2.97*  HCT 29.9* 27.4*  PLT 200 189    Recent Labs  07/17/14 0430 07/18/14 0436  NA 136* 139  K 4.2 3.9  CL 100 101  CO2 27 26  BUN 15 15  CREATININE 0.58 0.57  GLUCOSE 163* 141*  CALCIUM 8.6 8.7   No results found for this basename: LABPT, INR,  in the last 72 hours  EXAM: General - Patient is Alert, Appropriate and Oriented Extremity - Neurovascular intact Sensation intact distally Dorsiflexion/Plantar flexion intact Incision - clean, dry, no drainage, healing Motor Function - intact, moving foot and toes well on exam.   Assessment/Plan: 2 Days Post-Op Procedure(s) (LRB): RIGHT TOTAL HIP ARTHROPLASTY ANTERIOR APPROACH (Right) Procedure(s) (LRB): RIGHT TOTAL HIP ARTHROPLASTY ANTERIOR APPROACH (Right) Past Medical History  Diagnosis Date  . HYPERLIPIDEMIA   . UNSPECIFIED ANEMIA   . LEUKOPENIA, CHRONIC   . ALLERGIC RHINITIS   . GERD   . Rosacea     facial  . RAYNAUD'S  SYNDROME, HX OF     improved after cardiac meds initiated  . Hx of adenomatous polyp of colon 06/05/08  . Hx of cardiac catheterization 2008     clean coronarys DrKelly   . Hx of chest pain     neg cath remot hx of narrowwing lad in 2003 nl 2008, none in many years  . Nasal injury 06/14/2012  . Diverticulitis   . Hepatic hemangioma 06/09/08  . Mononucleosis 1963  . Subacute thyroiditis   . IBS (irritable bowel syndrome)   . Hiatal hernia 03/2000  . Cholelithiasis 06/09/08  . Anginal pain   . Complication of anesthesia 2012    went to ICU after bilateral knees ,? unstable vital signs  . SLEEP APNEA, OBSTRUCTIVE     cpap, settings "automatic" settings 11-12  . EPICONDYLITIS, LATERAL     osteoarthritis, previous joint replacements   Principal Problem:   OA (osteoarthritis) of hip Active Problems:   HYPERLIPIDEMIA   SLEEP APNEA, OBSTRUCTIVE   ALLERGIC RHINITIS   GERD   Hyponatremia  Estimated body mass index is 38.96 kg/(m^2) as calculated from the following:   Height as of this encounter: 5' 6.5" (1.689 m).   Weight as of this encounter: 111.131 kg (245 lb). Up with therapy Discharge home with home health Diet - Cardiac diet Follow up - in 2 weeks Activity - WBAT Disposition - Home Condition Upon Discharge -  Good D/C Meds - See DC Summary DVT Prophylaxis - Xarelto  Arlee Muslim, PA-C Orthopaedic Surgery 07/18/2014, 7:30 AM

## 2014-07-18 NOTE — Discharge Instructions (Signed)
°Dr. Frank Aluisio °Total Joint Specialist °[facility] °[address] °[place], [place] [address] °[phone] ° ° ° °ANTERIOR APPROACH TOTAL HIP REPLACEMENT POSTOPERATIVE DIRECTIONS ° ° °Hip Rehabilitation, Guidelines Following Surgery  °The results of a hip operation are greatly improved after range of motion and muscle strengthening exercises. Follow all safety measures which are given to protect your hip. If any of these exercises cause increased pain or swelling in your joint, decrease the amount until you are comfortable again. Then slowly increase the exercises. Call your caregiver if you have problems or questions.  °HOME CARE INSTRUCTIONS  °Most of the following instructions are designed to prevent the dislocation of your new hip.  °Remove items at home which could result in a fall. This includes throw rugs or furniture in walking pathways.  °Continue medications as instructed at time of discharge. °· You may have some home medications which will be placed on hold until you complete the course of blood thinner medication. °· You may start showering once you are discharged home but do not submerge the incision under water. Just pat the incision dry and apply a dry gauze dressing on daily. °Do not put on socks or shoes without following the instructions of your caregivers.  °Sit on high chairs which makes it easier to stand.  °Sit on chairs with arms. Use the chair arms to help push yourself up when arising.  °Keep your leg on the side of the operation out in front of you when standing up.  °Arrange for the use of a toilet seat elevator so you are not sitting low.   °· Walk with walker as instructed.  °You may resume a sexual relationship in one month or when given the OK by your caregiver.  °Use walker as long as suggested by your caregivers.  °You may put full weight on your legs and walk as much as is comfortable. °Avoid periods of inactivity such as sitting longer than an hour  when not asleep. This helps prevent blood clots.  °You may return to work once you are cleared by your surgeon.  °Do not drive a car for 6 weeks or until released by your surgeon.  °Do not drive while taking narcotics.  °Wear elastic stockings for three weeks following surgery during the day but you may remove then at night.  °Make sure you keep all of your appointments after your operation with all of your doctors and caregivers. You should call the office at the above phone number and make an appointment for approximately two weeks after the date of your surgery. °Change the dressing daily and reapply a dry dressing each time. °Please pick up a stool softener and laxative for home use as long as you are requiring pain medications. °· Continue to use ice on the hip for pain and swelling from surgery. You may notice swelling that will progress down to the foot and ankle.  This is normal after  surgery.  Elevate the leg when you are not up walking on it.   °It is important for you to complete the blood thinner medication as prescribed by your doctor. °· Continue to use the breathing machine which will help keep your temperature down.  It is common for your temperature to cycle up and down following surgery, especially at night when you are not up moving around and exerting yourself.  The breathing machine keeps your lungs expanded and your temperature down. ° °RANGE OF MOTION AND STRENGTHENING EXERCISES  °  These exercises are designed to help you keep full movement of your hip joint. Follow your caregiver's or physical therapist's instructions. Perform all exercises about fifteen times, three times per day or as directed. Exercise both hips, even if you have had only one joint replacement. These exercises can be done on a training (exercise) mat, on the floor, on a table or on a bed. Use whatever works the best and is most comfortable for you. Use music or television while you are exercising so that the exercises are  a pleasant break in your day. This will make your life better with the exercises acting as a break in routine you can look forward to.  Lying on your back, slowly slide your foot toward your buttocks, raising your knee up off the floor. Then slowly slide your foot back down until your leg is straight again.  Lying on your back spread your legs as far apart as you can without causing discomfort.  Lying on your side, raise your upper leg and foot straight up from the floor as far as is comfortable. Slowly lower the leg and repeat.  Lying on your back, tighten up the muscle in the front of your thigh (quadriceps muscles). You can do this by keeping your leg straight and trying to raise your heel off the floor. This helps strengthen the largest muscle supporting your knee.  Lying on your back, tighten up the muscles of your buttocks both with the legs straight and with the knee bent at a comfortable angle while keeping your heel on the floor.   SKILLED REHAB INSTRUCTIONS: If the patient is transferred to a skilled rehab facility following release from the hospital, a list of the current medications will be sent to the facility for the patient to continue.  When discharged from the skilled rehab facility, please have the facility set up the patient's [place] prior to being released. Also, the skilled facility will be responsible for providing the patient with their medications at time of release from the facility to include their pain medication, the muscle relaxants, and their blood thinner medication. If the patient is still at the rehab facility at time of the two week follow up appointment, the skilled rehab facility will also need to assist the patient in arranging follow up appointment in our office and any transportation needs.  MAKE SURE YOU:  Understand these instructions.  Will watch your condition.  Will get help right away if you are not doing well or get worse.  Pick up  stool softner and laxative for home. Do not submerge incision under water. May shower. Continue to use ice for pain and swelling from surgery. Total Hip Protocol.  Take Xarelto for two and a half more weeks, then discontinue Xarelto. Once the patient has completed the Xarelto, they may resume the 81 mg Aspirin.

## 2014-07-18 NOTE — Discharge Summary (Signed)
Physician Discharge Summary   Patient ID: Terri Rodriguez MRN: [id number] DOB/AGE: 69/07/1946 69 y.o.  Admit date: 07/16/2014 Discharge date: 07/18/2014  Primary Diagnosis:  Osteoarthritis of the Right hip.   Admission Diagnoses:  Past Medical History  Diagnosis Date  . HYPERLIPIDEMIA   . UNSPECIFIED ANEMIA   . LEUKOPENIA, CHRONIC   . ALLERGIC RHINITIS   . GERD   . Rosacea     facial  . RAYNAUD'S SYNDROME, HX OF     improved after cardiac meds initiated  . Hx of adenomatous polyp of colon 06/05/08  . Hx of cardiac catheterization 2008     clean coronarys DrKelly   . Hx of chest pain     neg cath remot hx of narrowwing lad in 2003 nl 2008, none in many years  . Nasal injury 06/14/2012  . Diverticulitis   . Hepatic hemangioma 06/09/08  . Mononucleosis 1963  . Subacute thyroiditis   . IBS (irritable bowel syndrome)   . Hiatal hernia 03/2000  . Cholelithiasis 06/09/08  . Anginal pain   . Complication of anesthesia 2012    went to ICU after bilateral knees ,? unstable vital signs  . SLEEP APNEA, OBSTRUCTIVE     cpap, settings "automatic" settings 11-12  . EPICONDYLITIS, LATERAL     osteoarthritis, previous joint replacements   Discharge Diagnoses:   Principal Problem:   OA (osteoarthritis) of hip Active Problems:   HYPERLIPIDEMIA   SLEEP APNEA, OBSTRUCTIVE   ALLERGIC RHINITIS   GERD   Hyponatremia  Estimated body mass index is 38.96 kg/(m^2) as calculated from the following:   Height as of this encounter: 5' 6.5" (1.689 m).   Weight as of this encounter: 111.131 kg (245 lb).  Procedure(s) (LRB): RIGHT TOTAL HIP ARTHROPLASTY ANTERIOR APPROACH (Right)   Consults: None  HPI: Terri Rodriguez is a 68 y.o. female who has advanced end-  stage arthritis of his Right hip with progressively worsening pain and  dysfunction.The patient has failed nonoperative management and presents for  total hip arthroplasty.   Laboratory Data: Admission on 07/16/2014  Component Date Value Ref  Range Status  . ABO/RH(D) 07/16/2014 O NEG   Final  . Antibody Screen 07/16/2014 NEG   Final  . Sample Expiration 07/16/2014 07/19/2014   Final  . WBC 07/17/2014 7.5  4.0 - 10.5 K/uL Final  . RBC 07/17/2014 3.29* 3.87 - 5.11 MIL/uL Final  . Hemoglobin 07/17/2014 10.1* 12.0 - 15.0 g/dL Final  . HCT 07/17/2014 29.9* 36.0 - 46.0 % Final  . MCV 07/17/2014 90.9  78.0 - 100.0 fL Final  . MCH 07/17/2014 30.7  26.0 - 34.0 pg Final  . MCHC 07/17/2014 33.8  30.0 - 36.0 g/dL Final  . RDW 07/17/2014 14.8  11.5 - 15.5 % Final  . Platelets 07/17/2014 200  150 - 400 K/uL Final  . Sodium 07/17/2014 136* 137 - 147 mEq/L Final  . Potassium 07/17/2014 4.2  3.7 - 5.3 mEq/L Final  . Chloride 07/17/2014 100  96 - 112 mEq/L Final  . CO2 07/17/2014 27  19 - 32 mEq/L Final  . Glucose, Bld 07/17/2014 163* 70 - 99 mg/dL Final  . BUN 07/17/2014 15  6 - 23 mg/dL Final  . Creatinine, Ser 07/17/2014 0.58  0.50 - 1.10 mg/dL Final  . Calcium 07/17/2014 8.6  8.4 - 10.5 mg/dL Final  . GFR calc non Af Amer 07/17/2014 >90  >90 mL/min Final  . GFR calc Af Amer 07/17/2014 >90  >90 mL/min  Final   Comment: (NOTE)                          The eGFR has been calculated using the CKD EPI equation.                          This calculation has not been validated in all clinical situations.                          eGFR's persistently <90 mL/min signify possible Chronic Kidney                          Disease.  . Anion gap 07/17/2014 9  5 - 15 Final  . WBC 07/18/2014 9.3  4.0 - 10.5 K/uL Final  . RBC 07/18/2014 2.97* 3.87 - 5.11 MIL/uL Final  . Hemoglobin 07/18/2014 9.2* 12.0 - 15.0 g/dL Final  . HCT 07/18/2014 27.4* 36.0 - 46.0 % Final  . MCV 07/18/2014 92.3  78.0 - 100.0 fL Final  . MCH 07/18/2014 31.0  26.0 - 34.0 pg Final  . MCHC 07/18/2014 33.6  30.0 - 36.0 g/dL Final  . RDW 07/18/2014 15.1  11.5 - 15.5 % Final  . Platelets 07/18/2014 189  150 - 400 K/uL Final  . Sodium 07/18/2014 139  137 - 147 mEq/L Final  .  Potassium 07/18/2014 3.9  3.7 - 5.3 mEq/L Final  . Chloride 07/18/2014 101  96 - 112 mEq/L Final  . CO2 07/18/2014 26  19 - 32 mEq/L Final  . Glucose, Bld 07/18/2014 141* 70 - 99 mg/dL Final  . BUN 07/18/2014 15  6 - 23 mg/dL Final  . Creatinine, Ser 07/18/2014 0.57  0.50 - 1.10 mg/dL Final  . Calcium 07/18/2014 8.7  8.4 - 10.5 mg/dL Final  . GFR calc non Af Amer 07/18/2014 >90  >90 mL/min Final  . GFR calc Af Amer 07/18/2014 >90  >90 mL/min Final   Comment: (NOTE)                          The eGFR has been calculated using the CKD EPI equation.                          This calculation has not been validated in all clinical situations.                          eGFR's persistently <90 mL/min signify possible Chronic Kidney                          Disease.  Georgiann Hahn gap 07/18/2014 12  5 - 15 Final  Hospital Outpatient Visit on 07/10/2014  Component Date Value Ref Range Status  . aPTT 07/10/2014 29  24 - 37 seconds Final  . WBC 07/10/2014 3.3* 4.0 - 10.5 K/uL Final  . RBC 07/10/2014 3.86* 3.87 - 5.11 MIL/uL Final  . Hemoglobin 07/10/2014 11.7* 12.0 - 15.0 g/dL Final  . HCT 07/10/2014 34.8* 36.0 - 46.0 % Final  . MCV 07/10/2014 90.2  78.0 - 100.0 fL Final  . MCH 07/10/2014 30.3  26.0 - 34.0 pg Final  . MCHC 07/10/2014 33.6  30.0 - 36.0 g/dL Final  . RDW  07/10/2014 15.0  11.5 - 15.5 % Final  . Platelets 07/10/2014 217  150 - 400 K/uL Final  . Sodium 07/10/2014 139  137 - 147 mEq/L Final  . Potassium 07/10/2014 4.5  3.7 - 5.3 mEq/L Final  . Chloride 07/10/2014 103  96 - 112 mEq/L Final  . CO2 07/10/2014 26  19 - 32 mEq/L Final  . Glucose, Bld 07/10/2014 97  70 - 99 mg/dL Final  . BUN 07/10/2014 20  6 - 23 mg/dL Final  . Creatinine, Ser 07/10/2014 0.67  0.50 - 1.10 mg/dL Final  . Calcium 07/10/2014 9.5  8.4 - 10.5 mg/dL Final  . Total Protein 07/10/2014 6.9  6.0 - 8.3 g/dL Final  . Albumin 07/10/2014 3.9  3.5 - 5.2 g/dL Final  . AST 07/10/2014 34  0 - 37 U/L Final  . ALT 07/10/2014 22   0 - 35 U/L Final  . Alkaline Phosphatase 07/10/2014 103  39 - 117 U/L Final  . Total Bilirubin 07/10/2014 0.3  0.3 - 1.2 mg/dL Final  . GFR calc non Af Amer 07/10/2014 88* >90 mL/min Final  . GFR calc Af Amer 07/10/2014 >90  >90 mL/min Final   Comment: (NOTE)                          The eGFR has been calculated using the CKD EPI equation.                          This calculation has not been validated in all clinical situations.                          eGFR's persistently <90 mL/min signify possible Chronic Kidney                          Disease.  . Anion gap 07/10/2014 10  5 - 15 Final  . Prothrombin Time 07/10/2014 12.9  11.6 - 15.2 seconds Final  . INR 07/10/2014 0.97  0.00 - 1.49 Final  . Color, Urine 07/10/2014 YELLOW  YELLOW Final  . APPearance 07/10/2014 CLEAR  CLEAR Final  . Specific Gravity, Urine 07/10/2014 1.008  1.005 - 1.030 Final  . pH 07/10/2014 7.0  5.0 - 8.0 Final  . Glucose, UA 07/10/2014 NEGATIVE  NEGATIVE mg/dL Final  . Hgb urine dipstick 07/10/2014 NEGATIVE  NEGATIVE Final  . Bilirubin Urine 07/10/2014 NEGATIVE  NEGATIVE Final  . Ketones, ur 07/10/2014 NEGATIVE  NEGATIVE mg/dL Final  . Protein, ur 07/10/2014 NEGATIVE  NEGATIVE mg/dL Final  . Urobilinogen, UA 07/10/2014 0.2  0.0 - 1.0 mg/dL Final  . Nitrite 07/10/2014 NEGATIVE  NEGATIVE Final  . Leukocytes, UA 07/10/2014 NEGATIVE  NEGATIVE Final   MICROSCOPIC NOT DONE ON URINES WITH NEGATIVE PROTEIN, BLOOD, LEUKOCYTES, NITRITE, OR GLUCOSE <1000 mg/dL.  Marland Kitchen MRSA, PCR 07/10/2014 NEGATIVE  NEGATIVE Final  . Staphylococcus aureus 07/10/2014 NEGATIVE  NEGATIVE Final   Comment:                                 The Xpert SA Assay (FDA                          approved for NASAL specimens  in patients over 66 years of age),                          is one component of                          a comprehensive surveillance                          program.  Test performance has                           been validated by [employer] for patients greater                          than or equal to 62 year old.                          It is not intended                          to diagnose infection nor to                          guide or monitor treatment.     X-Rays:Dg Hip Complete Right  07/10/2014   CLINICAL DATA:  Preop radiograph.  Osteoarthritis.  EXAM: RIGHT HIP - COMPLETE 2+ VIEW  COMPARISON:  None.  FINDINGS: The patient is status post left hip arthroplasty. There is moderate to severe right hip arthroplasty including joint space narrowing, subchondral sclerosis and marginal spur formation. Postoperative changes from lumbar decompression and posterior fusion noted.  IMPRESSION: 1. Moderate to severe right hip osteoarthritis.   Electronically Signed   By: Kerby Moors M.D.   On: 07/10/2014 16:20   Dg Pelvis Portable  07/16/2014   CLINICAL DATA:  Osteoarthritis of the right hip.  EXAM: PORTABLE PELVIS 1-2 VIEWS, DG C-ARM 1-60 MIN - NRPT [facility];  COMPARISON:  Radiograph dated 10/21/2013  FINDINGS: AP portable view of the pelvis demonstrates that the acetabular femoral components of the new right total hip prosthesis appear in excellent position in the AP projection. No fractures. Soft tissue drain present on the right.  The other pelvic bones appear normal. Left total hip prosthesis appears unchanged.  IMPRESSION: Satisfactory appearance of right hip in the AP projection after total hip prosthesis insertion. C-arm imaging was provided during the operation.   Electronically Signed   By: Rozetta Nunnery M.D.   On: 07/16/2014 17:34   Dg C-arm 1-60 Min-no Report  07/16/2014   CLINICAL DATA:  Osteoarthritis of the right hip.  EXAM: PORTABLE PELVIS 1-2 VIEWS, DG C-ARM 1-60 MIN - [facility];  COMPARISON:  Radiograph dated 10/21/2013  FINDINGS: AP portable view of the pelvis demonstrates that the acetabular femoral components of the new right total hip prosthesis appear in  excellent position in the AP projection. No fractures. Soft tissue drain present on the right.  The other pelvic bones appear normal. Left total hip prosthesis appears unchanged.  IMPRESSION: Satisfactory appearance of right hip in the AP projection after total hip prosthesis insertion. C-arm imaging  was provided during the operation.   Electronically Signed   By: Rozetta Nunnery M.D.   On: 07/16/2014 17:34    EKG: Orders placed in visit on 01/06/14  . EKG 12-LEAD     Hospital Course: Patient was admitted to [facility] and taken to the OR and underwent the above state procedure without complications.  Patient tolerated the procedure well and was later transferred to the recovery room and then to the orthopaedic floor for postoperative care.  They were given PO and IV analgesics for pain control following their surgery.  They were given 24 hours of postoperative antibiotics of  Anti-infectives   Start     Dose/Rate Route Frequency Ordered Stop   07/16/14 2200  ceFAZolin (ANCEF) IVPB 2 g/50 mL premix     2 g 100 mL/hr over 30 Minutes Intravenous Every 6 hours 07/16/14 1835 07/17/14 0455   07/16/14 1158  ceFAZolin (ANCEF) IVPB 2 g/50 mL premix     2 g 100 mL/hr over 30 Minutes Intravenous On call to O.R. 07/16/14 1158 07/16/14 1445     and started on DVT prophylaxis in the form of Xarelto.   PT and OT were ordered for total hip protocol.  The patient was allowed to be WBAT with therapy. Discharge planning was consulted to help with postop disposition and equipment needs.  Patient had a decent night on the evening of surgery.  They started to get up OOB with therapy on day one.  Hemovac drain was pulled without difficulty.  Continued to work with therapy into day two.  Dressing was changed on day two and the incision was healing well. Patient was seen in rounds by Dr. Wynelle Link and was ready to go home after therapy.  Discharge home with home health  Diet - Cardiac diet  Follow up - in 2 weeks   Activity - WBAT  Disposition - Home  Condition Upon Discharge - Good  D/C Meds - See DC Summary  DVT Prophylaxis - Xarelto   Discharge Instructions   Call MD / Call 911    Complete by:  As directed   If you experience chest pain or shortness of breath, CALL 911 and be transported to the hospital emergency room.  If you develope a fever above 101 F, pus (white drainage) or increased drainage or redness at the wound, or calf pain, call your surgeon's office.     Change dressing    Complete by:  As directed   You may change your dressing dressing daily with sterile 4 x 4 inch gauze dressing and paper tape.  Do not submerge the incision under water.     Constipation Prevention    Complete by:  As directed   Drink plenty of fluids.  Prune juice may be helpful.  You may use a stool softener, such as Colace (over the counter) 100 mg twice a day.  Use MiraLax (over the counter) for constipation as needed.     Diet - low sodium heart healthy    Complete by:  As directed      Discharge instructions    Complete by:  As directed   Pick up stool softner and laxative for home. Do not submerge incision under water. May shower. Continue to use ice for pain and swelling from surgery.  Total Hip Protocol.  Take Xarelto for two and a half more weeks, then discontinue Xarelto. Once the patient has completed the Xarelto, they may resume the 81 mg Aspirin.  Do not sit on low chairs, stoools or toilet seats, as it may be difficult to get up from low surfaces    Complete by:  As directed      Driving restrictions    Complete by:  As directed   No driving until released by the physician.     Increase activity slowly as tolerated    Complete by:  As directed      Lifting restrictions    Complete by:  As directed   No lifting until released by the physician.     Patient may shower    Complete by:  As directed   You may shower without a dressing once there is no drainage.  Do not wash over the  wound.  If drainage remains, do not shower until drainage stops.     TED hose    Complete by:  As directed   Use stockings (TED hose) for 3 weeks on both leg(s).  You may remove them at night for sleeping.     Weight bearing as tolerated    Complete by:  As directed             Medication List    STOP taking these medications       aspirin 81 MG tablet     calcium carbonate 600 MG Tabs tablet  Commonly known as:  OS-CAL     celecoxib 200 MG capsule  Commonly known as:  CELEBREX     estradiol 0.05 MG/24HR patch  Commonly known as:  VIVELLE-DOT     estradiol 0.5 MG tablet  Commonly known as:  ESTRACE     GINGER PO     multivitamin tablet     TART CHERRY ADVANCED PO     TURMERIC PO     VITAMIN D-3 PO      TAKE these medications       amLODipine 5 MG tablet  Commonly known as:  NORVASC  Take 1 tablet (5 mg total) by mouth every evening.     atorvastatin 20 MG tablet  Commonly known as:  LIPITOR  Take 1 tablet (20 mg total) by mouth every evening.     carvedilol 3.125 MG tablet  Commonly known as:  COREG  Take 1 tablet (3.125 mg total) by mouth 2 (two) times daily with a meal.     DULoxetine 60 MG capsule  Commonly known as:  CYMBALTA  Take 60 mg by mouth every evening.     fexofenadine 180 MG tablet  Commonly known as:  ALLEGRA  Take 180 mg by mouth daily. Takes in PM     FINACEA 15 % cream  Generic drug:  Azelaic Acid  Apply 1 application topically 2 (two) times daily. After skin is thoroughly washed and patted dry, gently but thoroughly massage a thin film of azelaic acid cream into the affected area twice daily, in the morning and evening.     GAVISCON PO  Take 1 tablet by mouth 4 (four) times daily as needed (heart burn).     HYDROmorphone 2 MG tablet  Commonly known as:  DILAUDID  Take 1-2 tablets (2-4 mg total) by mouth every 4 (four) hours as needed for moderate pain or severe pain.     methocarbamol 500 MG tablet  Commonly known as:   ROBAXIN  Take 1 tablet (500 mg total) by mouth every 6 (six) hours as needed for muscle spasms.     metroNIDAZOLE 1 % gel  Commonly  known as:  METROGEL  Apply 1 application topically daily. Apply to face     nitroGLYCERIN 0.4 MG SL tablet  Commonly known as:  NITROSTAT  Place 0.4 mg under the tongue every 5 (five) minutes as needed for chest pain.     omeprazole-sodium bicarbonate 40-1100 MG per capsule  Commonly known as:  ZEGERID  Take 1 capsule by mouth daily before breakfast.     ramipril 10 MG capsule  Commonly known as:  ALTACE  Take 10 mg by mouth every morning.     rivaroxaban 10 MG Tabs tablet  Commonly known as:  XARELTO  - Take 1 tablet (10 mg total) by mouth daily with breakfast. Take Xarelto for two and a half more weeks, then discontinue Xarelto.  - Once the patient has completed the Xarelto, they may resume the 81 mg Aspirin.     TYLENOL 500 MG tablet  Generic drug:  acetaminophen  Take 1,000 mg by mouth every 6 (six) hours as needed for mild pain or headache.     valACYclovir 1000 MG tablet  Commonly known as:  VALTREX  Take 1,000 mg by mouth 2 (two) times daily as needed. For outbreaks           Follow-up Information   Follow up with [name], MD. Schedule an appointment as soon as possible for a visit on 07/29/2014. (Call office at [phone] for appointment time on the 25th.)    Specialty:  Orthopedic Surgery   Contact information:   [address] [place] [address] [place] [address] [phone]       Signed: Arlee Muslim, PA-C Orthopaedic Surgery 07/18/2014, 8:08 AM

## 2014-07-18 NOTE — Progress Notes (Signed)
Occupational Therapy Treatment Patient Details Name: Terri Rodriguez MRN: [id number] DOB: 12/20/1945 Today's Date: 07/18/2014    History of present illness  Pt is s/p R direct anterior THA.   OT comments  Pt states she feels comfortable with AE for LB self care and she was up to 3in1 earlier for toileting and declines need to practice again. Focused on shower transfer and pt had difficulty stepping out of shower with R LE due to pain but she states her shower ledge is not that high or deep. She feels she will be able to do better with hers and can sponge bathe if needed a day or two. She verbalizes understanding of safety considerations and the shower technique recommended.   Follow Up Recommendations  No OT follow up;Supervision/Assistance - 24 hour    Equipment Recommendations  None recommended by OT    Recommendations for Other Services      Precautions / Restrictions Precautions Precautions: Fall Restrictions Weight Bearing Restrictions: No       Mobility Bed Mobility                  Transfers Overall transfer level: Needs assistance Equipment used: Rolling walker (2 wheeled) Transfers: Sit to/from Stand Sit to Stand: Min guard         General transfer comment: verbal cues for hand placement and R LE    Balance                                   ADL                                   Tub/ Shower Transfer: Walk-in shower;Minimal assistance;Moderate assistance;Rolling walker     General ADL Comments: Pt states her shower ledge is not as high as the one here and not as deep. Practiced shower transfer with walker and pt able to step into shower backwards with operated LE but having difficulty picking up R LE to step out of the shower. She needed therapist assist to manually assist R LE over shower ledge and min support for balance. Educated pt on safety with position of shower chair and how spouse should steady walker as she steps in  and out. Discussed if pt is feeling fatigued or having difficulty picking R LE off the floor to clear shower ledge then to NOT shower until she is able to do so easily. Pt verbalizes understanding of safety issues and states she can sponge bathe a day or so if needed. She states she has a toilet riser with handles and not a 3in1 so she will use her shower chair in the shower. Pt with 9/10 pain in R LE when attempting to step over ledge with R LE but pt due for pain meds . Pain meds requested.       Vision                     Perception     Praxis      Cognition   Behavior During Therapy: [facility] for tasks assessed/performed Overall Cognitive Status: Within Functional Limits for tasks assessed                       Extremity/Trunk Assessment  Exercises     Shoulder Instructions       General Comments      Pertinent Vitals/ Pain       Pain Assessment: 0-10 Pain Score: 9  Pain Location: R hip, thigh Pain Descriptors / Indicators: Aching Pain Intervention(s): Patient requesting pain meds-RN notified;Repositioned;Ice applied  Home Living                                          Prior Functioning/Environment              Frequency Min 2X/week     Progress Toward Goals  OT Goals(current goals can now be found in the care plan section)  Progress towards OT goals:  (pain limited shower transfer )     Plan Discharge plan remains appropriate    Co-evaluation                 End of Session Equipment Utilized During Treatment: Gait belt;Rolling walker   Activity Tolerance Patient limited by pain   Patient Left in chair;with call bell/phone within reach   Nurse Communication          Time: 5456-2563 OT Time Calculation (min): 27 min  Charges: OT General Charges $OT Visit: 1 Procedure OT Treatments $Therapeutic Activity: 23-37 mins  Jules Schick [phone] 07/18/2014, 11:11 AM

## 2014-07-18 NOTE — Progress Notes (Signed)
Pt to d/c home with [facility]. No DME needs. AVS reviewed and "My Chart" discussed with pt. Pt capable of verbalizing medications, dressing changes, signs and symptoms of infection, and follow-up appointments. Remains hemodynamically stable. No signs and symptoms of distress. Educated pt to return to ER in the case of SOB, dizziness, or chest pain.

## 2014-08-12 ENCOUNTER — Telehealth: Payer: Self-pay | Admitting: Family Medicine

## 2014-08-12 NOTE — Telephone Encounter (Signed)
Ok to refill all for 6 months  

## 2014-08-12 NOTE — Telephone Encounter (Signed)
Pt is also requesting refills of duloxetine and celecoxib.  Please advise.  Thanks!

## 2014-08-12 NOTE — Telephone Encounter (Signed)
Received a fax from Terri Rodriguez.  Pt is requesting a refill of valACYclovir (VALTREX) 1000 MG tablet.  Ninety day supply. Will send to [facility] for review.

## 2014-08-13 MED ORDER — VALACYCLOVIR HCL 1 G PO TABS: 1000.0000 mg | ORAL_TABLET | Freq: Two times a day (BID) | ORAL | Status: DC | PRN

## 2014-08-13 MED ORDER — DULOXETINE HCL 60 MG PO CPEP: 60.0000 mg | ORAL_CAPSULE | Freq: Every evening | ORAL | Status: DC

## 2014-08-13 MED ORDER — CELECOXIB 200 MG PO CAPS: 200.0000 mg | ORAL_CAPSULE | Freq: Every day | ORAL | Status: DC

## 2014-08-13 NOTE — Telephone Encounter (Signed)
Sent to the pharmacy by e-scribe. 

## 2014-09-08 ENCOUNTER — Ambulatory Visit (INDEPENDENT_AMBULATORY_CARE_PROVIDER_SITE_OTHER): Payer: Medicare Other | Admitting: Family Medicine

## 2014-09-08 DIAGNOSIS — Z23 Encounter for immunization: Secondary | ICD-10-CM

## 2014-10-06 ENCOUNTER — Encounter (HOSPITAL_COMMUNITY): Payer: Self-pay | Admitting: Orthopedic Surgery

## 2014-10-13 ENCOUNTER — Telehealth: Payer: Self-pay | Admitting: Internal Medicine

## 2014-10-13 NOTE — Telephone Encounter (Signed)
[facility] - [place], [place] is requesting re-fills on celecoxib (CELEBREX) 200 MG capsule and DULoxetine (CYMBALTA) 60 MG capsule

## 2014-10-13 NOTE — Telephone Encounter (Signed)
Okay to refill 6 months Needs yearly office visit in February or thereabouts

## 2014-10-14 MED ORDER — DULOXETINE HCL 60 MG PO CPEP: 60.0000 mg | ORAL_CAPSULE | Freq: Every evening | ORAL | Status: DC

## 2014-10-14 MED ORDER — CELECOXIB 200 MG PO CAPS: 200.0000 mg | ORAL_CAPSULE | Freq: Every day | ORAL | Status: DC

## 2014-10-14 NOTE — Telephone Encounter (Signed)
Pt is sch wed. 01/21/15

## 2014-10-14 NOTE — Telephone Encounter (Signed)
I have sent both prescriptions to [facility].  Can you help the pt to get on the schedule around Feb for CPX?

## 2014-11-26 ENCOUNTER — Encounter: Payer: Self-pay | Admitting: Internal Medicine

## 2014-12-19 ENCOUNTER — Encounter: Payer: Self-pay | Admitting: Obstetrics & Gynecology

## 2014-12-19 ENCOUNTER — Ambulatory Visit (INDEPENDENT_AMBULATORY_CARE_PROVIDER_SITE_OTHER): Payer: Medicare Other | Admitting: Obstetrics & Gynecology

## 2014-12-19 VITALS — BP 134/78 | HR 86 | Resp 16 | Ht 66.0 in | Wt 246.8 lb

## 2014-12-19 DIAGNOSIS — Z01419 Encounter for gynecological examination (general) (routine) without abnormal findings: Secondary | ICD-10-CM

## 2014-12-19 MED ORDER — ESTRADIOL 0.5 MG PO TABS: ORAL_TABLET | ORAL | Status: DC

## 2014-12-19 NOTE — Progress Notes (Signed)
69 y.o. G3P1 MarriedCaucasianF here for annual exam.  Doing well.  Helping to care for granddaughter.  No vaginal bleeding.  Had left hip replacement.    No LMP recorded. Patient has had a hysterectomy.          Sexually active: Yes.    The current method of family planning is status post hysterectomy.    Exercising: No.  The patient does not participate in regular exercise at present. Smoker:  No   Health Maintenance: Pap:  08/16/10 Negative  History of abnormal Pap:  no MMG:  05/09/14 MMG ; 05/14/14 additional views u/s; - 6 month f/u recommended; 11/17/14 6 month f/u on left breast- 6 month f/u recommended Colonoscopy: 02/05/14 normal small hemorrhoids f/u in 5-7 years. BMD:   05/09/14, -0.9 TDaP:  01/10/14  Screening Labs: no , Labs: Shanon Ace, MD has appointment scheduled in February.   reports that she has never smoked. She has never used smokeless tobacco. She reports that she drinks about 8.4 oz of alcohol per week. She reports that she does not use illicit drugs.  Past Medical History  Diagnosis Date  . HYPERLIPIDEMIA   . UNSPECIFIED ANEMIA   . LEUKOPENIA, CHRONIC   . ALLERGIC RHINITIS   . GERD   . Rosacea     facial  . RAYNAUD'S SYNDROME, HX OF     improved after cardiac meds initiated  . Hx of adenomatous polyp of colon 06/05/08  . Hx of cardiac catheterization 2008     clean coronarys DrKelly   . Hx of chest pain     neg cath remot hx of narrowwing lad in 2003 nl 2008, none in many years  . Nasal injury 06/14/2012  . Diverticulitis   . Hepatic hemangioma 06/09/08  . Mononucleosis 1963  . Subacute thyroiditis   . IBS (irritable bowel syndrome)   . Hiatal hernia 03/2000  . Cholelithiasis 06/09/08  . Anginal pain   . Complication of anesthesia 2012    went to ICU after bilateral knees ,? unstable vital signs  . SLEEP APNEA, OBSTRUCTIVE     cpap, settings "automatic" settings 11-12  . EPICONDYLITIS, LATERAL     osteoarthritis, previous joint replacements    Past Surgical  History  Procedure Laterality Date  . Abdominal hysterectomy    . Tonsillectomy    . Cesarean section  1985, 1989  . Dilation and curettage of uterus    . Nasal septoplasty w/ turbinoplasty    . Lumbar surgery fixation with disc replacement  10/08    Left  . Knee arthroscopy      bilateral  . Replacement total knee      bilateral  . Tonsillectomy  1952  . Dental implants  11/2002,11/2011,11/2012  . Back surgery  02/06/2008  . Joint replacement  09/21/2011    bilateral knee  . Total hip arthroplasty Left 10/21/2013    Procedure: LEFT TOTAL HIP ARTHROPLASTY ANTERIOR APPROACH;  Surgeon: [name], MD;  Location: [facility];  Service: Orthopedics;  Laterality: Left;  . Nocturnal polysomnogram  12/31/2006    Severe obstructive sleep apnea. AHI-87/hr  . Cardiac catheterization  04/07/2007    Noncritical coronary artery disease. Contiue medical therapy.  . Transthoracic echocardiogram  11/09/2007    EF 52%, LV systolic function normal. Mild aortic root dilation.  . Cardiovascular stress test  11/09/2007    Moderate ischemia in Mid Anterior, Mid Anteroseptal, Apical Anterior, and Apical Septal regions. EKG negative for ischemia.  . Cardiac catheterization  12/03/2007    Normal LV function. Mild angiographic mitral valve prolapse. Normal coronary arteries.  . Wrist surgery Left     ORIF "distal radial head fracture"  . Total hip arthroplasty Right 07/16/2014    Procedure: RIGHT TOTAL HIP ARTHROPLASTY ANTERIOR APPROACH;  Surgeon: [name], MD;  Location: [facility];  Service: Orthopedics;  Laterality: Right;    Family History  Problem Relation Age of Onset  . Rheum arthritis Mother   . Diabetes Mother   . Heart failure Mother   . Thrombocytopenia Mother   . Sleep apnea Mother   . Ulcers Mother     PUD  . Deep vein thrombosis Father   . Pulmonary embolism Father   . Osteoarthritis Father   . Atrial fibrillation Father   . Dementia Father   . Sleep apnea Father   . Sleep apnea  Brother   . Obesity Brother   . Sleep apnea Brother   . Obesity Brother   . Colon cancer Maternal Aunt 37  . Pancreatic cancer    . Uterine cancer    . Leukemia    . Inflammatory bowel disease      aunt  . Congenital adrenal hyperplasia Grandchild   . Atrial fibrillation Brother   . Colon cancer Maternal Aunt 90    ROS:  Pertinent items are noted in HPI.  Otherwise, a comprehensive ROS was negative.  Exam:   BP 134/78 mmHg  Pulse 86  Resp 16  Ht 5\' 6"  (1.676 m)  Wt 246 lb 12.8 oz (111.948 kg)  BMI 39.85 kg/m2   Height: 5\' 6"  (167.6 cm)  Ht Readings from Last 3 Encounters:  12/19/14 5\' 6"  (1.676 m)  07/16/14 5' 6.5" (1.689 m)  06/20/14 5' 6.5" (1.689 m)    General appearance: alert, cooperative and appears stated age Head: Normocephalic, without obvious abnormality, atraumatic Neck: no adenopathy, supple, symmetrical, trachea midline and thyroid normal to inspection and palpation Lungs: clear to auscultation bilaterally Breasts: normal appearance, no masses or tenderness Heart: regular rate and rhythm Abdomen: soft, non-tender; bowel sounds normal; no masses,  no organomegaly Extremities: extremities normal, atraumatic, no cyanosis or edema Skin: Skin color, texture, turgor normal. No rashes or lesions Lymph nodes: Cervical, supraclavicular, and axillary nodes normal. No abnormal inguinal nodes palpated Neurologic: Grossly normal   Pelvic: External genitalia:  no lesions              Urethra:  normal appearing urethra with no masses, tenderness or lesions              Bartholins and Skenes: normal                 Vagina: normal appearing vagina with normal color and discharge, no lesions              Cervix: absent              Pap taken: No. Bimanual Exam:  Uterus:  uterus absent              Adnexa: no mass, fullness, tenderness               Rectovaginal: Confirms               Anus:  normal sphincter tone, no lesions  Chaperone was present for exam.  A:   Well Woman with normal exam PMP, on low dose HRT H/O colonic polyps Anemia from hip surgery.  Will have CBC drawn with Dr.  Panosh next week.  P: Mammogram yearly pap smear not indicated due to hx of TAH/BSO 1993 On estradiol 0.5mg , 1/2 tab daily.  #45/4RF Labs with Dr. Regis Bill return annually or prn

## 2015-01-12 ENCOUNTER — Ambulatory Visit (INDEPENDENT_AMBULATORY_CARE_PROVIDER_SITE_OTHER): Payer: Medicare Other | Admitting: Cardiovascular Disease

## 2015-01-12 ENCOUNTER — Encounter: Payer: Self-pay | Admitting: Cardiovascular Disease

## 2015-01-12 VITALS — BP 146/64 | Ht 66.0 in | Wt 249.1 lb

## 2015-01-12 DIAGNOSIS — E785 Hyperlipidemia, unspecified: Secondary | ICD-10-CM | POA: Insufficient documentation

## 2015-01-12 DIAGNOSIS — I2510 Atherosclerotic heart disease of native coronary artery without angina pectoris: Secondary | ICD-10-CM

## 2015-01-12 DIAGNOSIS — I7300 Raynaud's syndrome without gangrene: Secondary | ICD-10-CM

## 2015-01-12 DIAGNOSIS — G4733 Obstructive sleep apnea (adult) (pediatric): Secondary | ICD-10-CM

## 2015-01-12 MED ORDER — ATORVASTATIN CALCIUM 20 MG PO TABS: 20.0000 mg | ORAL_TABLET | Freq: Every evening | ORAL | Status: DC

## 2015-01-12 MED ORDER — CARVEDILOL 3.125 MG PO TABS: 3.1250 mg | ORAL_TABLET | Freq: Two times a day (BID) | ORAL | Status: DC

## 2015-01-12 MED ORDER — RAMIPRIL 10 MG PO CAPS
10.0000 mg | ORAL_CAPSULE | Freq: Every evening | ORAL | Status: DC
Start: 2015-01-12 — End: 2016-01-15

## 2015-01-12 MED ORDER — AMLODIPINE BESYLATE 5 MG PO TABS: 5.0000 mg | ORAL_TABLET | Freq: Every evening | ORAL | Status: DC

## 2015-01-12 NOTE — Patient Instructions (Signed)
Your physician wants you to follow-up in: 1 year or sooner with Dr. Claiborne Billings. You will receive a reminder letter in the mail two months in advance. If you don't receive a letter, please call our office to schedule the follow-up appointment.

## 2015-01-12 NOTE — Progress Notes (Signed)
Patient ID: Terri Rodriguez, female   DOB: [birth year], 69 y.o.   MRN: [id number]     HPI: Terri Rodriguez is a 69 y.o. female who presents to the office today for one-year cardiology evaluation.  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 Cardiac catheterization demonstrated a 30% tubular narrowing of the proximal LAD. She also is a history of probable Raynaud's with a remote history of digital ischemia/spasm. She has been aggressively treated with medical therapy both for potential coronary vasospasm as well as lipid-lowering therapy for treatment methods to optimize endothelial function and blood pressure. She does have significant arthritic issues with degenerative disc disease and has undergone surgery involving L3-L4, L4-L5 by Dr. Sherwood Gambler. She also is status post bilateral knee surgery as well as left hip replacement. She has a history of obstructive sleep apnea on CPAP therapy, history of osteoarthritis which has been progressive. There also is a history of rosacea.    Since I last saw her, she sustained a left radius fracture and required open reduction and internal fixation in April 2015.  In August 2015, she underwent right hip replacement.  Over the urinary half when she was fairly immobile due to her hip replacement surgeries.  She admits to significant weight gain.  Review of her chart indicates that she has gained approximately 50 pounds over the past 8 years.  Over the past year she denies recurrent episodes of chest tightness or pressure. She denies any Raynaud's phenomenon. She does recall a recent episode of recent esophageal spasm.   Past Medical History  Diagnosis Date  . HYPERLIPIDEMIA   . UNSPECIFIED ANEMIA   . LEUKOPENIA, CHRONIC   . ALLERGIC RHINITIS   . GERD   . Rosacea     facial  . RAYNAUD'S SYNDROME, HX OF     improved after cardiac meds initiated  . Hx of adenomatous polyp of colon 06/05/08  . Hx of cardiac  catheterization 2008     clean coronarys DrKelly   . Hx of chest pain     neg cath remot hx of narrowwing lad in 2003 nl 2008, none in many years  . Nasal injury 06/14/2012  . Diverticulitis   . Hepatic hemangioma 06/09/08  . Mononucleosis 1963  . Subacute thyroiditis   . IBS (irritable bowel syndrome)   . Hiatal hernia 03/2000  . Cholelithiasis 06/09/08  . Anginal pain   . Complication of anesthesia 2012    went to ICU after bilateral knees ,? unstable vital signs  . SLEEP APNEA, OBSTRUCTIVE     cpap, settings "automatic" settings 11-12  . EPICONDYLITIS, LATERAL     osteoarthritis, previous joint replacements    Past Surgical History  Procedure Laterality Date  . Abdominal hysterectomy    . Tonsillectomy    . Cesarean section  1985, 1989  . Dilation and curettage of uterus    . Nasal septoplasty w/ turbinoplasty    . Lumbar surgery fixation with disc replacement  10/08    Left  . Knee arthroscopy      bilateral  . Replacement total knee      bilateral  . Tonsillectomy  1952  . Dental implants  11/2002,11/2011,11/2012  . Back surgery  02/06/2008  . Joint replacement  09/21/2011    bilateral knee  . Total hip arthroplasty Left 10/21/2013    Procedure: LEFT TOTAL HIP ARTHROPLASTY ANTERIOR APPROACH;  Surgeon: [name], MD;  Location: [facility];  Service: Orthopedics;  Laterality: Left;  . Nocturnal polysomnogram  12/31/2006    Severe obstructive sleep apnea. AHI-87/hr  . Cardiac catheterization  04/07/2007    Noncritical coronary artery disease. Contiue medical therapy.  . Transthoracic echocardiogram  11/09/2007    EF 37%, LV systolic function normal. Mild aortic root dilation.  . Cardiovascular stress test  11/09/2007    Moderate ischemia in Mid Anterior, Mid Anteroseptal, Apical Anterior, and Apical Septal regions. EKG negative for ischemia.  . Cardiac catheterization  12/03/2007    Normal LV function. Mild angiographic mitral valve prolapse. Normal coronary arteries.  .  Wrist surgery Left 04/03/14    ORIF "distal radial head fracture"  . Total hip arthroplasty Right 07/16/2014    Procedure: RIGHT TOTAL HIP ARTHROPLASTY ANTERIOR APPROACH;  Surgeon: [name], MD;  Location: [facility];  Service: Orthopedics;  Laterality: Right;    Allergies  Allergen Reactions  . Codeine     nausea  . Crab [Shellfish Allergy] Itching and Nausea And Vomiting    Current Outpatient Prescriptions  Medication Sig Dispense Refill  . acetaminophen (TYLENOL) 500 MG tablet Take 1,000 mg by mouth every 6 (six) hours as needed for mild pain or headache.     . Alum Hydroxide-Mag Carbonate (GAVISCON PO) Take 1 tablet by mouth 4 (four) times daily as needed (heart burn).     Marland Kitchen amLODipine (NORVASC) 5 MG tablet Take 1 tablet (5 mg total) by mouth every evening. 90 tablet 3  . aspirin 81 MG tablet Take 81 mg by mouth daily.    Marland Kitchen atorvastatin (LIPITOR) 20 MG tablet Take 1 tablet (20 mg total) by mouth every evening. 90 tablet 3  . Azelaic Acid (FINACEA) 15 % cream Apply 1 application topically 2 (two) times daily. After skin is thoroughly washed and patted dry, gently but thoroughly massage a thin film of azelaic acid cream into the affected area twice daily, in the morning and evening.    . carvedilol (COREG) 3.125 MG tablet Take 1 tablet (3.125 mg total) by mouth 2 (two) times daily with a meal. 180 tablet 3  . celecoxib (CELEBREX) 200 MG capsule Take 1 capsule (200 mg total) by mouth daily. 90 capsule 1  . Cholecalciferol (VITAMIN D3) 2000 UNITS TABS Take 1 tablet by mouth daily.    . DULoxetine (CYMBALTA) 60 MG capsule Take 1 capsule (60 mg total) by mouth every evening. 90 capsule 1  . estradiol (ESTRACE) 0.5 MG tablet Take 1/2 tab daily 45 tablet 4  . fexofenadine (ALLEGRA) 180 MG tablet Take 180 mg by mouth daily. Takes in PM    . metroNIDAZOLE (METROGEL) 1 % gel Apply 1 application topically daily. Apply to face    . Multiple Vitamins-Minerals (CENTRUM ADULTS PO) Take 1 tablet by  mouth daily.    . nitroGLYCERIN (NITROSTAT) 0.4 MG SL tablet Place 0.4 mg under the tongue every 5 (five) minutes as needed for chest pain.     Marland Kitchen omeprazole-sodium bicarbonate (ZEGERID) 40-1100 MG per capsule Take 1 capsule by mouth daily before breakfast.     . ramipril (ALTACE) 10 MG capsule Take 1 capsule (10 mg total) by mouth every evening. 90 capsule 3  . valACYclovir (VALTREX) 1000 MG tablet Take 1 tablet (1,000 mg total) by mouth 2 (two) times daily as needed. For outbreaks 180 tablet 1   No current facility-administered medications for this visit.    History   Social History  . Marital Status: Married    Spouse Name:  N/A    Number of Children: 1  . Years of Education: N/A   Occupational History  . Physician    Social History Main Topics  . Smoking status: Never Smoker   . Smokeless tobacco: Never Used  . Alcohol Use: 8.4 oz/week    14 Glasses of wine per week     Comment: 1 glass nightly  . Drug Use: No  . Sexual Activity:    Partners: Male    Birth Control/ Protection: Surgical     Comment: TAH/BSO   Other Topics Concern  . 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 Rheum arthritis Mother   . Diabetes Mother   . Heart failure Mother   . Thrombocytopenia Mother   . Sleep apnea Mother   . Ulcers Mother     PUD  . Deep vein thrombosis Father   . Pulmonary embolism Father   . Osteoarthritis Father   . Atrial fibrillation Father   . Dementia Father   . Sleep apnea Father   . Sleep apnea Brother   . Obesity Brother   . Sleep apnea Brother   . Obesity Brother   . Colon cancer Maternal Aunt 1  . Pancreatic cancer    . Uterine cancer    . Leukemia    . Inflammatory bowel disease      aunt  . Congenital adrenal  hyperplasia Grandchild   . Atrial fibrillation Brother   . Colon cancer Maternal Aunt 90    ROS General: Negative; No fevers, chills, or night sweats; positive for weight gain HEENT: Negative; No changes in vision or hearing, sinus congestion, difficulty swallowing Pulmonary: Negative; No cough, wheezing, shortness of breath, hemoptysis Cardiovascular: Negative; No chest pain, presyncope, syncope, palpitations GI: Positive for GERD No nausea, vomiting, diarrhea, or abdominal pain GU: Negative; No dysuria, hematuria, or difficulty voiding Musculoskeletal: Positive for lumbar disc surgery, bilateral knee replacements and bilateral hip replacements Hematologic/Oncology: Negative; no easy bruising, bleeding Endocrine: Negative; no heat/cold intolerance; no diabetes Neuro: Negative; no changes in balance, headaches Skin: Negative; No rashes or skin lesions Psychiatric: Negative; No behavioral problems, depression Sleep: Positive for obstructive sleep apnea on CPAP therapy; No snoring, daytime sleepiness, hypersomnolence, bruxism, restless legs, hypnogognic hallucinations, no cataplexy Other comprehensive 14 point system review is negative.   PE BP 146/64 mmHg  Ht 5\' 6"  (1.676 m)  Wt 249 lb 1.6 oz (112.991 kg)  BMI 40.22 kg/m2  General: Alert, oriented, no distress. Moderate obesity Skin: normal turgor, no rashes HEENT: Normocephalic, atraumatic. Pupils round and reactive; sclera anicteric;no lid lag. Extraocular muscles intact. No xanthelasmas Nose without nasal septal hypertrophy Mouth/Parynx benign; Mallinpatti scale 3 Neck: No JVD, no carotid bruits; normal carotid upstroke Lungs: clear to ausculatation and percussion; no wheezing or rales Chest wall: no tenderness to palpitation Heart: RRR, s1 s2 normal; 1/6 systolic murmur, no S3 or S4 gallop. No rub. No heaves. Abdomen: soft, nontender; no hepatosplenomehaly, BS+; abdominal aorta nontender and not dilated by palpation. Back: no  CVA tenderness Pulses 2+ Extremities: no clubbing cyanosis or edema, Homan's sign negative  Neurologic: grossly nonfocal; cranial nerves grossly normal. Psychologic: normal affect and mood.  ECG (  independently read by me): Normal sinus rhythm at 81 bpm.  Previously noted T-wave inversion V1 and V2 which is old and has been documented for over 10 years.  Mild QTC increased at 469 ms  February 2015 ECG (independently read by me): Sinus rhythm at 76 beats per minute period. She noted T wave inversion in V1 V2 which is old.  LABS:  BMET    Component Value Date/Time   NA 139 07/18/2014 0436   K 3.9 07/18/2014 0436   CL 101 07/18/2014 0436   CO2 26 07/18/2014 0436   GLUCOSE 141* 07/18/2014 0436   GLUCOSE 88 11/08/2006 1015   BUN 15 07/18/2014 0436   CREATININE 0.57 07/18/2014 0436   CALCIUM 8.7 07/18/2014 0436   GFRNONAA >90 07/18/2014 0436   GFRAA >90 07/18/2014 0436     Hepatic Function Panel     Component Value Date/Time   PROT 6.9 07/10/2014 1535   ALBUMIN 3.9 07/10/2014 1535   AST 34 07/10/2014 1535   ALT 22 07/10/2014 1535   ALKPHOS 103 07/10/2014 1535   BILITOT 0.3 07/10/2014 1535   BILIDIR 0.0 01/10/2014 1012     CBC    Component Value Date/Time   WBC 9.3 07/18/2014 0436   RBC 2.97* 07/18/2014 0436   HGB 9.2* 07/18/2014 0436   HCT 27.4* 07/18/2014 0436   PLT 189 07/18/2014 0436   MCV 92.3 07/18/2014 0436   MCH 31.0 07/18/2014 0436   MCHC 33.6 07/18/2014 0436   RDW 15.1 07/18/2014 0436   LYMPHSABS 1.3 04/03/2014 0905   MONOABS 0.3 04/03/2014 0905   EOSABS 0.2 04/03/2014 0905   BASOSABS 0.0 04/03/2014 0905     BNP No results found for: PROBNP  Lipid Panel     Component Value Date/Time   CHOL 180 01/10/2014 1012   TRIG 84.0 01/10/2014 1012   HDL 83.70 01/10/2014 1012   CHOLHDL 2 01/10/2014 1012   VLDL 16.8 01/10/2014 1012   LDLCALC 80 01/10/2014 1012     RADIOLOGY: No results found.    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 Repeat cardiac catheterization which was done by me in 2008 prior to her back surgery after nuclear perfusion study raise the possibility of ischemia. Catheterization at that time showed normalization of coronary arteries without significant obstruction. She is doing well and denies any recent episodes of digital ischemia or rhinitis symptomatology. She does have osteoarthritic changes of her hands which have become progressive. Her pressure today is well controlled on amlodipine 5 mg, ramipril 10 mg, in addition to her carvedilol 3.125 mg twice a day.  She is tolerating low-dose carvedilol and he denies any episodes of Raynaud's phenomenon She is on lipid-lowering therapy with atorvastatin 20 mg. she will be having blood work done by her primary physician, Dr. pain nausea last that these be forwarded to my office for my review.  I reviewed her paper chart over the many years.  She has gained 50 pounds of weight from when she had been on a Weight Watchers program.  Now that her hips have been replaced and she is able to ambulate more effectively, we discussed increased exercise.  She will need to lose weight.  Presently, she is morbidly obese with a body mass index of 40.22.  We discussed exercising and minimum of 5 days per week for at least 30 minutes at a time of moderate exercise as tolerated.  Her GERD  is controlled with Zegerid.  I will see her in one year for reevaluation or sooner if problems arise.   Time spent: 25 minutes  Troy Sine, MD, [facility]  01/12/2015 6:35 PM

## 2015-01-15 ENCOUNTER — Other Ambulatory Visit (INDEPENDENT_AMBULATORY_CARE_PROVIDER_SITE_OTHER): Payer: Medicare Other

## 2015-01-15 DIAGNOSIS — E785 Hyperlipidemia, unspecified: Secondary | ICD-10-CM

## 2015-01-15 DIAGNOSIS — Z0000 Encounter for general adult medical examination without abnormal findings: Secondary | ICD-10-CM

## 2015-01-15 LAB — CBC WITH DIFFERENTIAL/PLATELET
Basophils Absolute: 0 10*3/uL (ref 0.0–0.1)
Basophils Relative: 0.4 % (ref 0.0–3.0)
Eosinophils Absolute: 0.2 10*3/uL (ref 0.0–0.7)
Eosinophils Relative: 4.5 % (ref 0.0–5.0)
HCT: 32.9 % — ABNORMAL LOW (ref 36.0–46.0)
Hemoglobin: 11.1 g/dL — ABNORMAL LOW (ref 12.0–15.0)
Lymphocytes Relative: 28.8 % (ref 12.0–46.0)
Lymphs Abs: 1.1 10*3/uL (ref 0.7–4.0)
MCHC: 33.6 g/dL (ref 30.0–36.0)
MCV: 82.8 fl (ref 78.0–100.0)
Monocytes Absolute: 0.3 10*3/uL (ref 0.1–1.0)
Monocytes Relative: 8.9 % (ref 3.0–12.0)
Neutro Abs: 2.2 10*3/uL (ref 1.4–7.7)
Neutrophils Relative %: 57.4 % (ref 43.0–77.0)
Platelets: 228 10*3/uL (ref 150.0–400.0)
RBC: 3.97 Mil/uL (ref 3.87–5.11)
RDW: 17.5 % — ABNORMAL HIGH (ref 11.5–15.5)
WBC: 3.8 10*3/uL — ABNORMAL LOW (ref 4.0–10.5)

## 2015-01-15 LAB — COMPREHENSIVE METABOLIC PANEL
ALT: 24 U/L (ref 0–35)
AST: 34 U/L (ref 0–37)
Albumin: 4.1 g/dL (ref 3.5–5.2)
Alkaline Phosphatase: 99 U/L (ref 39–117)
BUN: 21 mg/dL (ref 6–23)
CO2: 29 mEq/L (ref 19–32)
Calcium: 9.3 mg/dL (ref 8.4–10.5)
Chloride: 106 mEq/L (ref 96–112)
Creatinine, Ser: 0.68 mg/dL (ref 0.40–1.20)
GFR: 90.93 mL/min (ref >60.00)
Glucose, Bld: 104 mg/dL — ABNORMAL HIGH (ref 70–99)
Potassium: 5 mEq/L (ref 3.5–5.1)
Sodium: 141 mEq/L (ref 135–145)
Total Bilirubin: 0.4 mg/dL (ref 0.2–1.2)
Total Protein: 6.7 g/dL (ref 6.0–8.3)

## 2015-01-15 LAB — LIPID PANEL
Cholesterol: 159 mg/dL (ref 0–200)
HDL: 74.7 mg/dL (ref >39.00)
LDL Cholesterol: 65 mg/dL (ref 0–99)
NonHDL: 84.3
Total CHOL/HDL Ratio: 2
Triglycerides: 95 mg/dL (ref 0.0–149.0)
VLDL: 19 mg/dL (ref 0.0–40.0)

## 2015-01-15 LAB — TSH: TSH: 1.85 u[IU]/mL (ref 0.35–4.50)

## 2015-01-21 ENCOUNTER — Encounter: Payer: Self-pay | Admitting: Internal Medicine

## 2015-01-21 ENCOUNTER — Ambulatory Visit (INDEPENDENT_AMBULATORY_CARE_PROVIDER_SITE_OTHER): Payer: Medicare Other | Admitting: Internal Medicine

## 2015-01-21 VITALS — BP 114/64 | Temp 98.2°F | Ht 65.5 in | Wt 249.7 lb

## 2015-01-21 DIAGNOSIS — Z6841 Body Mass Index (BMI) 40.0 and over, adult: Secondary | ICD-10-CM

## 2015-01-21 DIAGNOSIS — M1990 Unspecified osteoarthritis, unspecified site: Secondary | ICD-10-CM

## 2015-01-21 DIAGNOSIS — M159 Polyosteoarthritis, unspecified: Secondary | ICD-10-CM

## 2015-01-21 DIAGNOSIS — Z0000 Encounter for general adult medical examination without abnormal findings: Secondary | ICD-10-CM | POA: Insufficient documentation

## 2015-01-21 DIAGNOSIS — I7300 Raynaud's syndrome without gangrene: Secondary | ICD-10-CM

## 2015-01-21 DIAGNOSIS — M150 Primary generalized (osteo)arthritis: Secondary | ICD-10-CM

## 2015-01-21 DIAGNOSIS — G4733 Obstructive sleep apnea (adult) (pediatric): Secondary | ICD-10-CM

## 2015-01-21 DIAGNOSIS — Z5181 Encounter for therapeutic drug level monitoring: Secondary | ICD-10-CM

## 2015-01-21 NOTE — Progress Notes (Signed)
Pre visit review using our clinic review tool, if applicable. No additional management support is needed unless otherwise documented below in the visit note.  Chief Complaint  Patient presents with  . Medicare Wellness    OA  medication labs     HPI: Patient comes in today for Preventive Medicare wellness visit . Has had  mutliepl issues with OA. celbrex and coffee and tylenol helps.  Has had l wrist fracture ORIF dr Apolonio Schneiders 4 15 nl dexa r hip replacement 8 15 alusio  Consult dr d arthritis  OA Mood stable for situation We are rx cymbalta and celebrex  Rest of meds byt specialists  Health Maintenance  Topic Date Due  . INFLUENZA VACCINE  07/06/2015  . MAMMOGRAM  11/17/2016  . TETANUS/TDAP  01/11/2024  . COLONOSCOPY  02/06/2024  . DEXA SCAN  Completed  . PNEUMOCOCCAL POLYSACCHARIDE VACCINE AGE 70 AND OVER  Completed  . ZOSTAVAX  Completed   Health Maintenance Review LIFESTYLE:  Exercise:  Some now walking Tobacco/ETS:no Alcohol: per day 1-2 Sugar beverages:no Sleep:OSA has sdry nose and mouth  Drug use: no Bone density: reported ok Colonoscopy: utd.    Hearing: usually ok   Vision:  No limitations at present . Last eye check UTD  Safety:  Has smoke detector and wears seat belts.  No firearms. No excess sun exposure. Sees dentist regularly.  Falls:  No recently except the one that caused arm fracture   Advance directive :  Reviewed  Doesn't.   have one.HO given  Memory: Felt to be good  ,prob name finding no concern from her or her family.  Depression: No anhedonia unusual crying or depressive symptoms  Nutrition: Eats well balanced diet; adequate calcium and vitamin D. No swallowing chewing problems.  Injury: no major injuries in the last six months.  Other healthcare providers:  Reviewed today .  Social:  Lives with spouse married. No pets.   Preventive parameters: up-to-date  Reviewed   ADLS:   There are no problems or need for assistance  driving,  feeding, obtaining food, dressing, toileting and bathing, managing money using phone. She is independent.   ROS:  GEN/ HEENT: No fever, significant weight changes sweats headaches vision problems hearing changes, CV/ PULM; No chest pain shortness of breath cough, syncope,edema  change in exercise tolerance. GI /GU: No adominal pain, vomiting, change in bowel habits. No blood in the stool. No significant GU symptoms. SKIN/HEME: ,no acute skin rashes suspicious lesions or bleeding. No lymphadenopathy, nodules, masses.  NEURO/ PSYCH:  No neurologic signs such as weakness numbness. No depression anxiety. IMM/ Allergy: No unusual infections.  Allergy .   REST of 12 system review negative except as per HPI   Past Medical History  Diagnosis Date  . HYPERLIPIDEMIA   . UNSPECIFIED ANEMIA   . LEUKOPENIA, CHRONIC   . ALLERGIC RHINITIS   . GERD   . Rosacea     facial  . RAYNAUD'S SYNDROME, HX OF     improved after cardiac meds initiated  . Hx of adenomatous polyp of colon 06/05/08  . Hx of cardiac catheterization 2008     clean coronarys DrKelly   . Hx of chest pain     neg cath remot hx of narrowwing lad in 2003 nl 2008, none in many years  . Nasal injury 06/14/2012  . Diverticulitis   . Hepatic hemangioma 06/09/08  . Mononucleosis 1963  . Subacute thyroiditis   . IBS (irritable bowel syndrome)   .  Hiatal hernia 03/2000  . Cholelithiasis 06/09/08  . Anginal pain   . Complication of anesthesia 2012    went to ICU after bilateral knees ,? unstable vital signs  . SLEEP APNEA, OBSTRUCTIVE     cpap, settings "automatic" settings 11-12  . EPICONDYLITIS, LATERAL     osteoarthritis, previous joint replacements    Family History  Problem Relation Age of Onset  . Rheum arthritis Mother   . Diabetes Mother   . Heart failure Mother   . Thrombocytopenia Mother   . Sleep apnea Mother   . Ulcers Mother     PUD  . Deep vein thrombosis Father   . Pulmonary embolism Father   . Osteoarthritis  Father   . Atrial fibrillation Father   . Dementia Father   . Sleep apnea Father   . Sleep apnea Brother   . Obesity Brother   . Sleep apnea Brother   . Obesity Brother   . Colon cancer Maternal Aunt 49  . Pancreatic cancer    . Uterine cancer    . Leukemia    . Inflammatory bowel disease      aunt  . Congenital adrenal hyperplasia Grandchild   . Atrial fibrillation Brother   . Colon cancer Maternal Aunt 90    History   Social History  . Marital Status: Married    Spouse Name: N/A  . Number of Children: 1  . Years of Education: N/A   Occupational History  . Physician    Social History Main Topics  . Smoking status: Never Smoker   . Smokeless tobacco: Never Used  . Alcohol Use: 8.4 oz/week    14 Glasses of wine per week     Comment: 1 glass nightly  . Drug Use: No  . Sexual Activity:    Partners: Male    Birth Control/ Protection: Surgical     Comment: TAH/BSO   Other Topics Concern  . 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       Outpatient Encounter Prescriptions as of 01/21/2015  Medication Sig  . acetaminophen (TYLENOL) 500 MG tablet Take 1,000 mg by mouth every 6 (six) hours as needed for mild pain or headache.   . Alum Hydroxide-Mag Carbonate (GAVISCON PO) Take 1 tablet by mouth 4 (four) times daily as needed (heart burn).   Marland Kitchen amLODipine (NORVASC) 5 MG tablet Take 1 tablet (5 mg total) by mouth every evening.  Marland Kitchen amoxicillin (AMOXIL) 500 MG capsule Take 500 mg by mouth 3 (three) times daily.  Marland Kitchen aspirin 81 MG tablet Take 81 mg by mouth daily.  Marland Kitchen atorvastatin (LIPITOR) 20 MG tablet Take 1 tablet (20 mg total) by mouth every evening.  . Azelaic Acid (FINACEA) 15 % cream Apply 1 application topically 2 (two) times daily. After skin is thoroughly washed and patted dry, gently but thoroughly massage a thin film of azelaic acid cream into the affected area  twice daily, in the morning and evening.  . carvedilol (COREG) 3.125 MG tablet Take 1 tablet (3.125 mg total) by mouth 2 (two) times daily with a meal.  . celecoxib (CELEBREX) 200 MG capsule Take 1 capsule (200 mg total) by mouth daily.  . Cholecalciferol (VITAMIN D3) 2000 UNITS TABS Take 1 tablet by mouth daily.  . DULoxetine (CYMBALTA) 60 MG capsule Take 1 capsule (60 mg total)  by mouth every evening.  Marland Kitchen estradiol (ESTRACE) 0.5 MG tablet Take 1/2 tab daily  . fexofenadine (ALLEGRA) 180 MG tablet Take 180 mg by mouth daily. Takes in PM  . metroNIDAZOLE (METROGEL) 1 % gel Apply 1 application topically daily. Apply to face  . Multiple Vitamins-Minerals (CENTRUM ADULTS PO) Take 1 tablet by mouth daily.  Marland Kitchen omeprazole-sodium bicarbonate (ZEGERID) 40-1100 MG per capsule Take 1 capsule by mouth daily before breakfast.   . ramipril (ALTACE) 10 MG capsule Take 1 capsule (10 mg total) by mouth every evening.  . valACYclovir (VALTREX) 1000 MG tablet Take 1 tablet (1,000 mg total) by mouth 2 (two) times daily as needed. For outbreaks  . nitroGLYCERIN (NITROSTAT) 0.4 MG SL tablet Place 0.4 mg under the tongue every 5 (five) minutes as needed for chest pain.     EXAM:  BP 114/64 mmHg  Temp(Src) 98.2 F (36.8 C) (Oral)  Ht 5' 5.5" (1.664 m)  Wt 249 lb 11.2 oz (113.263 kg)  BMI 40.91 kg/m2  Body mass index is 40.91 kg/(m^2).  Physical Exam: Vital signs reviewed OEV:OJJK is a well-developed well-nourished alert cooperative   who appears stated age in no acute distress.  HEENT: normocephalic atraumatic , Eyes: PERRL EOM's full, conjunctiva clear, Nares: paten,t no deformity discharge or tenderness., Ears: no deformity EAC's clear TMs with normal landmarks. Mouth: clear OP, no lesions, edema.  Moist mucous membranes. Dentition in adequate repair. NECK: supple without masses, thyromegaly or bruits. CHEST/PULM:  Clear to auscultation and percussion breath sounds equal no wheeze , rales or rhonchi. No  chest wall deformities or tenderness. CV: PMI is nondisplaced, S1 S2 no gallops, murmurs, rubs. Peripheral pulses are full without delay.No JVD .  Breast: normal by inspection . No dimpling, discharge, masses, tenderness or discharge . ABDOMEN: Bowel sounds normal nontender  No guard or rebound, no hepato splenomegal no CVA tenderness.  No hernia. Extremtities:  No clubbing cyanosis or edema, no acute joint swelling or redness no focal atrophy  Obvious deformity oa changes hands  NEURO:  Oriented x3, cranial nerves 3-12 appear to be intact, no obvious focal weakness,gait within normal limits no abnormal reflexes or asymmetrical SKIN: No acute rashes normal turgor, color, no bruising or petechiae. PSYCH: Oriented, good eye contact, no obvious depression anxiety, cognition and judgment appear normal. LN: no cervical axillary inguinal adenopathy No noted deficits in memory, attention, and speech.   Lab Results  Component Value Date   WBC 3.8* 01/15/2015   HGB 11.1* 01/15/2015   HCT 32.9* 01/15/2015   PLT 228.0 01/15/2015   GLUCOSE 104* 01/15/2015   CHOL 159 01/15/2015   TRIG 95.0 01/15/2015   HDL 74.70 01/15/2015   LDLCALC 65 01/15/2015   ALT 24 01/15/2015   AST 34 01/15/2015   NA 141 01/15/2015   K 5.0 01/15/2015   CL 106 01/15/2015   CREATININE 0.68 01/15/2015   BUN 21 01/15/2015   CO2 29 01/15/2015   TSH 1.85 01/15/2015   INR 0.97 07/10/2014   HGBA1C 5.7 01/10/2014    ASSESSMENT AND PLAN:  Discussed the following assessment and plan:  Visit for preventive health examination  Medicare annual wellness visit, subsequent  Primary osteoarthritis involving multiple joints - pt aware mobility improving    Medication monitoring encounter  Raynaud's phenomenon  Arthritis  Obstructive sleep apnea  BMI 40.0-44.9, adult Advance directive sheet given and disc  celbrex helps her fucntion and benfit more than risk  Patient Care Team: Burnis Medin, MD as PCP -  General Troy Sine, MD (Cardiology) Hosie Spangle, MD (Neurosurgery) Lyman Speller, MD as Attending Physician (Obstetrics and Gynecology) [name], MD as Attending Physician (Orthopedic Surgery) Deneise Lever, MD (Pulmonary Disease) Bo Merino, MD as Consulting Physician (Rheumatology) Jari Pigg, MD as Consulting Physician (Dermatology) Linton Rump, MD as Consulting Physician (Ophthalmology) Linna Hoff, MD as Consulting Physician (Orthopedic Surgery)  Patient Instructions  Flu vaccine yearly . Activity .  Healthy lifestyle includes : At least 150 minutes of exercise weeks  , weight at healthy levels, which is usually   BMI 19-25. Avoid trans fats and processed foods;  Increase fresh fruits and veges to 5 servings per day. And avoid sweet beverages including tea and juice. Mediterranean diet with olive oil and nuts have been noted to be heart and brain healthy . Avoid tobacco products . Limit  alcohol to  7 per week for women and 14 servings for men.  Get adequate sleep . Wear seat belts . Don't text and drive .       Standley Brooking. Madlynn Lundeen M.D.

## 2015-01-21 NOTE — Patient Instructions (Signed)
Flu vaccine yearly . Activity .  Healthy lifestyle includes : At least 150 minutes of exercise weeks  , weight at healthy levels, which is usually   BMI 19-25. Avoid trans fats and processed foods;  Increase fresh fruits and veges to 5 servings per day. And avoid sweet beverages including tea and juice. Mediterranean diet with olive oil and nuts have been noted to be heart and brain healthy . Avoid tobacco products . Limit  alcohol to  7 per week for women and 14 servings for men.  Get adequate sleep . Wear seat belts . Don't text and drive .

## 2015-03-20 ENCOUNTER — Other Ambulatory Visit: Payer: Self-pay | Admitting: Internal Medicine

## 2015-03-20 NOTE — Telephone Encounter (Signed)
Sent to the pharmacy by e-scribe. 

## 2015-06-15 LAB — HM MAMMOGRAPHY

## 2015-06-16 ENCOUNTER — Encounter: Payer: Self-pay | Admitting: Family Medicine

## 2015-06-22 ENCOUNTER — Encounter: Payer: Self-pay | Admitting: Internal Medicine

## 2015-06-22 ENCOUNTER — Ambulatory Visit (INDEPENDENT_AMBULATORY_CARE_PROVIDER_SITE_OTHER): Payer: Medicare Other | Admitting: Internal Medicine

## 2015-06-22 VITALS — BP 122/68 | HR 77 | Ht 66.0 in | Wt 244.0 lb

## 2015-06-22 DIAGNOSIS — G4733 Obstructive sleep apnea (adult) (pediatric): Secondary | ICD-10-CM

## 2015-06-22 MED ORDER — AZELASTINE-FLUTICASONE 137-50 MCG/ACT NA SUSP: 1.0000 | Freq: Every day | NASAL | Status: DC

## 2015-06-22 NOTE — Progress Notes (Signed)
[address]- 67 yoF previously followed for OSA-FOLLOWS FOR:  Wearing CPAP 6-8 hours per night.  Needs order for new supplies [facility] NPSG 12/28/06- Severe OSA, AHI 87/ hr  Good CPAP compliance and control/ [facility] on AutoSet 4-20. Likes it "fine", used all night, every night.  Hx of Printzmetal's angina, but controlled and denies active heart or lung problems.  06/22/15- 58 yoF never snmoker followed for OSA. Complicated by hx Printzmetal's angina, obesity Good CPAP compliance and control/ [facility] on AutoSet 4-20 Follows For: Uses CPAP nightly approx 7-8 hours nightly. States mask fits fine and no new complaints.    ROS-see HPI Constitutional:   No-   weight loss, night sweats, fevers, chills, fatigue, lassitude. HEENT:   No-  headaches, difficulty swallowing, tooth/dental problems, sore throat,       No-  sneezing, itching, ear ache, nasal congestion, post nasal drip,  CV:  No-   chest pain, orthopnea, PND, swelling in lower extremities, anasarca,                                                 dizziness, palpitations Resp: No-   shortness of breath with exertion or at rest.              No-   productive cough,  No non-productive cough,  No- coughing up of blood.              No-   change in color of mucus.  No- wheezing.   Skin: No-   rash or lesions. GI:  No-   heartburn, indigestion, abdominal pain, nausea, vomiting,  GU:  MS:  +joint pain or swelling.  Neuro-     nothing unusual Psych:  No- change in mood or affect. No depression or anxiety.  No memory loss.  OBJ- Physical Exam General- Alert, Oriented, Affect-appropriate, Distress- none acute, +overweight Skin- rash-none, lesions- none, excoriation- none Lymphadenopathy- none Head- atraumatic            Eyes- Gross vision intact, PERRLA, conjunctivae and secretions clear            Ears- Hearing, canals-normal            Nose- Clear, no-Septal dev, mucus, polyps, erosion, perforation             Throat- Mallampati II-III , mucosa clear  , drainage- none, tonsils- atrophic Neck- flexible , trachea midline, no stridor , thyroid nl, carotid no bruit Chest - symmetrical excursion , unlabored           Heart/CV- RRR , no murmur , no gallop  , no rub, nl s1 s2                           - JVD- none , edema- none, stasis changes- none, varices- none           Lung- clear to P&A, wheeze- none, cough- none , dullness-none, rub- none           Chest wall-  Abd-  Br/ Gen/ Rectal- Not done, not indicated Extrem- cyanosis- none, clubbing, none, atrophy- none, strength- nl Neuro- grossly intact to observation

## 2015-06-22 NOTE — Patient Instructions (Signed)
We can continue CPAP autopap 4-20/ [facility]  Sample Dymista nasal spray   1-2 puffs each nostril once daily at bedtime. Try this next time you are having upper airway problems.  Consider trying otc (Deep Roots, Earth Fare)   Olbasa nasal spray

## 2015-06-30 ENCOUNTER — Encounter: Payer: Self-pay | Admitting: Internal Medicine

## 2015-07-01 ENCOUNTER — Encounter: Payer: Self-pay | Admitting: Obstetrics & Gynecology

## 2015-07-11 ENCOUNTER — Other Ambulatory Visit: Payer: Self-pay | Admitting: Internal Medicine

## 2015-07-14 NOTE — Telephone Encounter (Signed)
Sent to the pharmacy by e-scribe. 

## 2015-07-15 ENCOUNTER — Other Ambulatory Visit (INDEPENDENT_AMBULATORY_CARE_PROVIDER_SITE_OTHER): Payer: Medicare Other

## 2015-07-15 DIAGNOSIS — D649 Anemia, unspecified: Secondary | ICD-10-CM | POA: Diagnosis not present

## 2015-07-15 DIAGNOSIS — I10 Essential (primary) hypertension: Secondary | ICD-10-CM

## 2015-07-15 DIAGNOSIS — E119 Type 2 diabetes mellitus without complications: Secondary | ICD-10-CM

## 2015-07-15 LAB — CBC WITH DIFFERENTIAL/PLATELET
Basophils Absolute: 0 10*3/uL (ref 0.0–0.1)
Basophils Relative: 0.5 % (ref 0.0–3.0)
Eosinophils Absolute: 0.1 10*3/uL (ref 0.0–0.7)
Eosinophils Relative: 3.4 % (ref 0.0–5.0)
HCT: 36.9 % (ref 36.0–46.0)
Hemoglobin: 12.3 g/dL (ref 12.0–15.0)
Lymphocytes Relative: 40.1 % (ref 12.0–46.0)
Lymphs Abs: 1.4 10*3/uL (ref 0.7–4.0)
MCHC: 33.3 g/dL (ref 30.0–36.0)
MCV: 92.8 fl (ref 78.0–100.0)
Monocytes Absolute: 0.3 10*3/uL (ref 0.1–1.0)
Monocytes Relative: 7.2 % (ref 3.0–12.0)
Neutro Abs: 1.7 10*3/uL (ref 1.4–7.7)
Neutrophils Relative %: 48.8 % (ref 43.0–77.0)
Platelets: 225 10*3/uL (ref 150.0–400.0)
RBC: 3.97 Mil/uL (ref 3.87–5.11)
RDW: 15.9 % — ABNORMAL HIGH (ref 11.5–15.5)
WBC: 3.6 10*3/uL — ABNORMAL LOW (ref 4.0–10.5)

## 2015-07-15 LAB — BASIC METABOLIC PANEL
BUN: 19 mg/dL (ref 6–23)
CO2: 30 mEq/L (ref 19–32)
Calcium: 9.4 mg/dL (ref 8.4–10.5)
Chloride: 108 mEq/L (ref 96–112)
Creatinine, Ser: 0.69 mg/dL (ref 0.40–1.20)
GFR: 89.28 mL/min (ref >60.00)
Glucose, Bld: 111 mg/dL — ABNORMAL HIGH (ref 70–99)
Potassium: 5.4 mEq/L — ABNORMAL HIGH (ref 3.5–5.1)
Sodium: 146 mEq/L — ABNORMAL HIGH (ref 135–145)

## 2015-07-15 LAB — HEMOGLOBIN A1C: Hgb A1c MFr Bld: 5.5 % (ref 4.6–6.5)

## 2015-07-22 ENCOUNTER — Ambulatory Visit (INDEPENDENT_AMBULATORY_CARE_PROVIDER_SITE_OTHER): Payer: Medicare Other | Admitting: Internal Medicine

## 2015-07-22 ENCOUNTER — Encounter: Payer: Self-pay | Admitting: Internal Medicine

## 2015-07-22 VITALS — BP 110/68 | Temp 98.4°F | Ht 65.5 in | Wt 242.2 lb

## 2015-07-22 DIAGNOSIS — Z79899 Other long term (current) drug therapy: Secondary | ICD-10-CM | POA: Diagnosis not present

## 2015-07-22 DIAGNOSIS — G4733 Obstructive sleep apnea (adult) (pediatric): Secondary | ICD-10-CM

## 2015-07-22 DIAGNOSIS — R739 Hyperglycemia, unspecified: Secondary | ICD-10-CM

## 2015-07-22 DIAGNOSIS — I7300 Raynaud's syndrome without gangrene: Secondary | ICD-10-CM | POA: Diagnosis not present

## 2015-07-22 DIAGNOSIS — M1990 Unspecified osteoarthritis, unspecified site: Secondary | ICD-10-CM | POA: Diagnosis not present

## 2015-07-22 DIAGNOSIS — Z6841 Body Mass Index (BMI) 40.0 and over, adult: Secondary | ICD-10-CM

## 2015-07-22 NOTE — Progress Notes (Signed)
Pre visit review using our clinic review tool, if applicable. No additional management support is needed unless otherwise documented below in the visit note.  Chief Complaint  Patient presents with  . Follow-up    CDM    HPI:  Terri Rodriguez 69 y.o.  comes in for chronic disease/ medication management   Has OA problematic raynauds elevated lipids  And OSA  bmi over 40 .    Doing ok ortho balance related to ortho sp hip replacement. Arthritis the same no sp hip replacement .   Knows is overweight and stuck  Moving forward with  weight loss     celebrex   Not covered by insuance   Other nsaids cause burning stomach such as aleve advil mobic .   Had sweating episode.  No cp sob  No recurrence .  Was at store ? After eating  No syncope.    Feels doing ok as far as mood  Not stuck in depression  ROS: See pertinent positives and negatives per HPI.   No current cp sob  As above  Bleeding  No unusual infections  Past Medical History  Diagnosis Date  . HYPERLIPIDEMIA   . UNSPECIFIED ANEMIA   . LEUKOPENIA, CHRONIC   . ALLERGIC RHINITIS   . GERD   . Rosacea     facial  . RAYNAUD'S SYNDROME, HX OF     improved after cardiac meds initiated  . Hx of adenomatous polyp of colon 06/05/08  . Hx of cardiac catheterization 2008     clean coronarys DrKelly   . Hx of chest pain     neg cath remot hx of narrowwing lad in 2003 nl 2008, none in many years  . Nasal injury 06/14/2012  . Diverticulitis   . Hepatic hemangioma 06/09/08  . Mononucleosis 1963  . Subacute thyroiditis   . IBS (irritable bowel syndrome)   . Hiatal hernia 03/2000  . Cholelithiasis 06/09/08  . Anginal pain   . Complication of anesthesia 2012    went to ICU after bilateral knees ,? unstable vital signs  . SLEEP APNEA, OBSTRUCTIVE     cpap, settings "automatic" settings 11-12  . EPICONDYLITIS, LATERAL     osteoarthritis, previous joint replacements    Family History  Problem Relation Age of Onset  . Rheum arthritis  Mother   . Diabetes Mother   . Heart failure Mother   . Thrombocytopenia Mother   . Sleep apnea Mother   . Ulcers Mother     PUD  . Deep vein thrombosis Father   . Pulmonary embolism Father   . Osteoarthritis Father   . Atrial fibrillation Father   . Dementia Father   . Sleep apnea Father   . Sleep apnea Brother   . Obesity Brother   . Sleep apnea Brother   . Obesity Brother   . Colon cancer Maternal Aunt 82  . Pancreatic cancer    . Uterine cancer    . Leukemia    . Inflammatory bowel disease      aunt  . Congenital adrenal hyperplasia Grandchild   . Atrial fibrillation Brother   . Colon cancer Maternal Aunt 90    Social History   Social History  . Marital Status: Married    Spouse Name: N/A  . Number of Children: 1  . Years of Education: N/A   Occupational History  . Physician    Social History Main Topics  . Smoking status: Never Smoker   .  Smokeless tobacco: Never Used  . Alcohol Use: 8.4 oz/week    14 Glasses of wine per week     Comment: 1 glass nightly  . Drug Use: No  . Sexual Activity:    Partners: Male    Birth Control/ Protection: Surgical     Comment: TAH/BSO   Other Topics Concern  . 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       Outpatient Prescriptions Prior to Visit  Medication Sig Dispense Refill  . acetaminophen (TYLENOL) 500 MG tablet Take 1,000 mg by mouth every 6 (six) hours as needed for mild pain or headache.     . Alum Hydroxide-Mag Carbonate (GAVISCON PO) Take 1 tablet by mouth 4 (four) times daily as needed (heart burn).     Marland Kitchen amLODipine (NORVASC) 5 MG tablet Take 1 tablet (5 mg total) by mouth every evening. 90 tablet 3  . aspirin 81 MG tablet Take 81 mg by mouth daily.    Marland Kitchen atorvastatin (LIPITOR) 20 MG tablet Take 1 tablet (20 mg total) by mouth every evening. 90 tablet 3  . Azelaic Acid (FINACEA) 15 % cream Apply 1  application topically 2 (two) times daily. After skin is thoroughly washed and patted dry, gently but thoroughly massage a thin film of azelaic acid cream into the affected area twice daily, in the morning and evening.    . carvedilol (COREG) 3.125 MG tablet Take 1 tablet (3.125 mg total) by mouth 2 (two) times daily with a meal. 180 tablet 3  . celecoxib (CELEBREX) 200 MG capsule TAKE 1 BY MOUTH DAILY 90 capsule 1  . Cholecalciferol (VITAMIN D3) 2000 UNITS TABS Take 1 tablet by mouth daily.    . clindamycin (CLEOCIN T) 1 % external solution 2 (two) times daily as needed.  3  . DULoxetine (CYMBALTA) 60 MG capsule TAKE 1 BY MOUTH EVERY EVENING 90 capsule 1  . DULoxetine (CYMBALTA) 60 MG capsule TAKE 1 CAPSULE (60 MG TOTAL) BY MOUTH EVERY EVENING. 90 capsule 0  . estradiol (ESTRACE) 0.5 MG tablet Take 1/2 tab daily 45 tablet 4  . fexofenadine (ALLEGRA) 180 MG tablet Take 180 mg by mouth daily. Takes in PM    . metroNIDAZOLE (METROGEL) 1 % gel Apply 1 application topically daily. Apply to face    . Multiple Vitamins-Minerals (CENTRUM ADULTS PO) Take 1 tablet by mouth daily.    . nitroGLYCERIN (NITROSTAT) 0.4 MG SL tablet Place 0.4 mg under the tongue every 5 (five) minutes as needed for chest pain.     Marland Kitchen omeprazole-sodium bicarbonate (ZEGERID) 40-1100 MG per capsule Take 1 capsule by mouth daily before breakfast.     . ramipril (ALTACE) 10 MG capsule Take 1 capsule (10 mg total) by mouth every evening. (Patient taking differently: Take 10 mg by mouth every morning. ) 90 capsule 3  . valACYclovir (VALTREX) 1000 MG tablet Take 1 tablet (1,000 mg total) by mouth 2 (two) times daily as needed. For outbreaks 180 tablet 1  . amoxicillin (AMOXIL) 500 MG capsule 500 mg. 1 hour prior to dental procedures.    . Azelastine-Fluticasone (DYMISTA) 137-50 MCG/ACT SUSP Place 1-2 puffs into the nose daily. 6 g 0   No facility-administered medications prior to visit.     EXAM:  BP 110/68 mmHg  Temp(Src) 98.4 F  (36.9 C) (Oral)  Ht 5'  5.5" (1.664 m)  Wt 242 lb 3.2 oz (109.861 kg)  BMI 39.68 kg/m2  Body mass index is 39.68 kg/(m^2).  GENERAL: vitals reviewed and listed above, alert, oriented, appears well hydrated and in no acute distress younger than stated age  28: atraumatic, conjunctiva  clear, no obvious abnormalities on inspection of external nose and earsNECK: no obvious masses on inspection palpation  LUNGS: clear to auscultation bilaterally, no wheezes, rales or rhonchi,  CV: HRRR, no clubbing cyanosis or  peripheral edema nl cap refill  MS: moves all extremities  Gait steady  oa changes on hands  Dip joints no redness or warmth  PSYCH: pleasant and cooperative, no obvious depression or anxiety Lab Results  Component Value Date   WBC 3.6* 07/15/2015   HGB 12.3 07/15/2015   HCT 36.9 07/15/2015   PLT 225.0 07/15/2015   GLUCOSE 111* 07/15/2015   CHOL 159 01/15/2015   TRIG 95.0 01/15/2015   HDL 74.70 01/15/2015   LDLCALC 65 01/15/2015   ALT 24 01/15/2015   AST 34 01/15/2015   NA 146* 07/15/2015   K 5.4* 07/15/2015   CL 108 07/15/2015   CREATININE 0.69 07/15/2015   BUN 19 07/15/2015   CO2 30 07/15/2015   TSH 1.85 01/15/2015   INR 0.97 07/10/2014   HGBA1C 5.5 07/15/2015   Wt Readings from Last 3 Encounters:  07/22/15 242 lb 3.2 oz (109.861 kg)  06/22/15 244 lb (110.678 kg)  01/21/15 249 lb 11.2 oz (113.263 kg)   BP Readings from Last 3 Encounters:  07/22/15 110/68  06/22/15 122/68  01/21/15 114/64   Labs reviewed ith patient  ASSESSMENT AND PLAN:  Discussed the following assessment and plan:  Arthritis  Raynaud's phenomenon - on vasodilator   Medication management  Hyperglycemia  BMI 40.0-44.9, adult - now under 39 some weight loss and continue  Obstructive sleep apnea Wellness prevetive monitoring  In feb 17 GI sx with aleve.   Etc  Check with insurance pharmacy about PA for celebrex .  Strategies  Balance exercises  No chang ein meds  Total visit 28mins  > 50% spent counseling and coordinating care as indicated in above note and in instructions to patient .  -Patient advised to return or notify health care team  if symptoms worsen ,persist or new concerns arise.  Patient Instructions  Let us know if need to do something to help with the celebrex   Scrip.  Otherwise continue Continue lifestyle intervention healthy eating and exercise . Work on balance exercises as we discussed .   Wellness visit and labs in 6-7 months .    cpx labs and   Hg a1c         Ermon Sagan K. Griffyn Kucinski M.D.

## 2015-07-22 NOTE — Patient Instructions (Signed)
Let us know if need to do something to help with the celebrex   Scrip.  Otherwise continue Continue lifestyle intervention healthy eating and exercise . Work on balance exercises as we discussed .   Wellness visit and labs in 6-7 months .    cpx labs and   Hg a1c

## 2015-09-09 ENCOUNTER — Telehealth: Payer: Self-pay | Admitting: Internal Medicine

## 2015-09-09 DIAGNOSIS — Z9989 Dependence on other enabling machines and devices: Secondary | ICD-10-CM

## 2015-09-09 DIAGNOSIS — G4733 Obstructive sleep apnea (adult) (pediatric): Secondary | ICD-10-CM

## 2015-09-09 NOTE — Telephone Encounter (Signed)
Patient says that [facility] told her that they need a reading from her CPAP and an order to obtain new CPAP supplies. Order sent to [facility]. Message sent to [facility] advising her of order. Nothing further needed.

## 2015-09-24 ENCOUNTER — Ambulatory Visit: Payer: Medicare Other

## 2015-10-01 ENCOUNTER — Ambulatory Visit (INDEPENDENT_AMBULATORY_CARE_PROVIDER_SITE_OTHER): Payer: Medicare Other

## 2015-10-01 DIAGNOSIS — Z23 Encounter for immunization: Secondary | ICD-10-CM | POA: Diagnosis not present

## 2015-10-08 ENCOUNTER — Other Ambulatory Visit: Payer: Self-pay | Admitting: Internal Medicine

## 2015-10-08 NOTE — Telephone Encounter (Signed)
Sent to the pharmacy by e-scribe.  Pt has upcoming wellness scheduled for 01/28/15.

## 2015-10-17 ENCOUNTER — Other Ambulatory Visit: Payer: Self-pay | Admitting: Internal Medicine

## 2015-10-20 NOTE — Telephone Encounter (Signed)
Sent to the pharmacy by e-scribe.  Pt has upcoming appt on 01/28/15 for medicare wellness.

## 2016-01-15 ENCOUNTER — Other Ambulatory Visit: Payer: Self-pay | Admitting: Cardiovascular Disease

## 2016-01-20 ENCOUNTER — Other Ambulatory Visit: Payer: Self-pay | Admitting: Family Medicine

## 2016-01-20 DIAGNOSIS — R739 Hyperglycemia, unspecified: Secondary | ICD-10-CM

## 2016-01-20 DIAGNOSIS — Z0000 Encounter for general adult medical examination without abnormal findings: Secondary | ICD-10-CM

## 2016-01-22 ENCOUNTER — Other Ambulatory Visit (INDEPENDENT_AMBULATORY_CARE_PROVIDER_SITE_OTHER): Payer: Medicare Other

## 2016-01-22 DIAGNOSIS — Z0000 Encounter for general adult medical examination without abnormal findings: Secondary | ICD-10-CM | POA: Diagnosis not present

## 2016-01-22 DIAGNOSIS — R739 Hyperglycemia, unspecified: Secondary | ICD-10-CM

## 2016-01-22 LAB — HEPATIC FUNCTION PANEL
ALT: 23 U/L (ref 0–35)
AST: 34 U/L (ref 0–37)
Albumin: 4.3 g/dL (ref 3.5–5.2)
Alkaline Phosphatase: 85 U/L (ref 39–117)
Bilirubin, Direct: 0.1 mg/dL (ref 0.0–0.3)
Total Bilirubin: 0.4 mg/dL (ref 0.2–1.2)
Total Protein: 6.8 g/dL (ref 6.0–8.3)

## 2016-01-22 LAB — BASIC METABOLIC PANEL
BUN: 18 mg/dL (ref 6–23)
CO2: 30 mEq/L (ref 19–32)
Calcium: 9.3 mg/dL (ref 8.4–10.5)
Chloride: 104 mEq/L (ref 96–112)
Creatinine, Ser: 0.68 mg/dL (ref 0.40–1.20)
GFR: 90.66 mL/min (ref >60.00)
Glucose, Bld: 108 mg/dL — ABNORMAL HIGH (ref 70–99)
Potassium: 4.9 mEq/L (ref 3.5–5.1)
Sodium: 141 mEq/L (ref 135–145)

## 2016-01-22 LAB — LIPID PANEL
Cholesterol: 163 mg/dL (ref 0–200)
HDL: 82.9 mg/dL (ref >39.00)
LDL Cholesterol: 59 mg/dL (ref 0–99)
NonHDL: 80.27
Total CHOL/HDL Ratio: 2
Triglycerides: 108 mg/dL (ref 0.0–149.0)
VLDL: 21.6 mg/dL (ref 0.0–40.0)

## 2016-01-22 LAB — CBC WITH DIFFERENTIAL/PLATELET
Basophils Absolute: 0 10*3/uL (ref 0.0–0.1)
Basophils Relative: 0.7 % (ref 0.0–3.0)
Eosinophils Absolute: 0.3 10*3/uL (ref 0.0–0.7)
Eosinophils Relative: 6.8 % — ABNORMAL HIGH (ref 0.0–5.0)
HCT: 37.4 % (ref 36.0–46.0)
Hemoglobin: 12.8 g/dL (ref 12.0–15.0)
Lymphocytes Relative: 28.1 % (ref 12.0–46.0)
Lymphs Abs: 1.1 10*3/uL (ref 0.7–4.0)
MCHC: 34.2 g/dL (ref 30.0–36.0)
MCV: 94.2 fl (ref 78.0–100.0)
Monocytes Absolute: 0.4 10*3/uL (ref 0.1–1.0)
Monocytes Relative: 9.5 % (ref 3.0–12.0)
Neutro Abs: 2.2 10*3/uL (ref 1.4–7.7)
Neutrophils Relative %: 54.9 % (ref 43.0–77.0)
Platelets: 211 10*3/uL (ref 150.0–400.0)
RBC: 3.97 Mil/uL (ref 3.87–5.11)
RDW: 15.3 % (ref 11.5–15.5)
WBC: 4 10*3/uL (ref 4.0–10.5)

## 2016-01-22 LAB — TSH: TSH: 1.35 u[IU]/mL (ref 0.35–4.50)

## 2016-01-22 LAB — HEMOGLOBIN A1C: Hgb A1c MFr Bld: 5.5 % (ref 4.6–6.5)

## 2016-01-29 ENCOUNTER — Ambulatory Visit (INDEPENDENT_AMBULATORY_CARE_PROVIDER_SITE_OTHER): Payer: Medicare Other | Admitting: Internal Medicine

## 2016-01-29 ENCOUNTER — Encounter: Payer: Self-pay | Admitting: Internal Medicine

## 2016-01-29 VITALS — BP 116/66 | Temp 98.5°F | Ht 65.5 in | Wt 241.5 lb

## 2016-01-29 DIAGNOSIS — M159 Polyosteoarthritis, unspecified: Secondary | ICD-10-CM

## 2016-01-29 DIAGNOSIS — Z79899 Other long term (current) drug therapy: Secondary | ICD-10-CM

## 2016-01-29 DIAGNOSIS — R739 Hyperglycemia, unspecified: Secondary | ICD-10-CM

## 2016-01-29 DIAGNOSIS — Z0000 Encounter for general adult medical examination without abnormal findings: Secondary | ICD-10-CM | POA: Diagnosis not present

## 2016-01-29 DIAGNOSIS — I7300 Raynaud's syndrome without gangrene: Secondary | ICD-10-CM

## 2016-01-29 DIAGNOSIS — M150 Primary generalized (osteo)arthritis: Secondary | ICD-10-CM

## 2016-01-29 DIAGNOSIS — E785 Hyperlipidemia, unspecified: Secondary | ICD-10-CM

## 2016-01-29 DIAGNOSIS — Z6839 Body mass index (BMI) 39.0-39.9, adult: Secondary | ICD-10-CM

## 2016-01-29 MED ORDER — AMOXICILLIN-POT CLAVULANATE 875-125 MG PO TABS: 1.0000 | ORAL_TABLET | Freq: Two times a day (BID) | ORAL | Status: DC

## 2016-01-29 NOTE — Progress Notes (Signed)
Pre visit review using our clinic review tool, if applicable. No additional management support is needed unless otherwise documented below in the visit note.  Chief Complaint  Patient presents with  . Medicare Wellness    medication  managment cdm     HPI: Terri Rodriguez 70 y.o. comes in today for Preventive Medicare wellness visit .  And Chronic disease management Joints,  Back neck  up some  Has djd . Taking celebrex qd. Not exercising at this time  problem with mood at times   Local issues reactive.  OSA  On cpap  Lipids nose of med noted  Knows she is  is overweight and still an issue   Health Maintenance  Topic Date Due  . FOOT EXAM  01/30/1956  . OPHTHALMOLOGY EXAM  01/30/1956  . Hepatitis C Screening  01/28/2017 (Originally 1946-01-24)  . INFLUENZA VACCINE  07/05/2016  . HEMOGLOBIN A1C  07/21/2016  . MAMMOGRAM  06/14/2017  . TETANUS/TDAP  01/11/2024  . COLONOSCOPY  02/06/2024  . DEXA SCAN  Completed  . ZOSTAVAX  Completed  . PNA vac Low Risk Adult  Completed   Health Maintenance Review LIFESTYLE:  TAD 1-2 etoh  Sugar beverages: Sleep: osa    5 year recal on colonoscopy  MEDICARE DOCUMENT QUESTIONS  TO SCAN  Hearing: ok  Vision:  No limitations at present . Last eye check UTD  Safety:  Has smoke detector and wears seat belts.  No firearms. No excess sun exposure. Sees dentist regularly.  Falls: n  Advance directive :  Reviewed  Has plan not filled out yet.  Memory: Felt to be good  , no concern from her or her family.  Depression: No anhedonia unusual crying or depressive symptoms  irritabel anger mostly over recetn election   Nutrition: Eats well balanced diet; adequate calcium and vitamin D. No swallowing chewing problems.  Injury: no major injuries in the last six months.  Other healthcare providers:  Reviewed today .  Social:  Lives with spouse married. [place] child   Preventive parameters: up-to-date  Reviewed   ADLS:   There are no problems  or need for assistance  driving, feeding, obtaining food, dressing, toileting and bathing, managing money using phone. She is independent. But  Joints are problematic   ROS:  GEN/ HEENT: No fever, significant weight changes sweats headaches vision problems hearing changes, CV/ PULM; No chest pain shortness of breath cough, syncope,edema  change in exercise tolerance. GI /GU: No adominal pain, vomiting, change in bowel habits. No blood in the stool. No significant GU symptoms. SKIN/HEME: ,no acute skin rashes suspicious lesions or bleeding. No lymphadenopathy, nodules, masses.  NEURO/ PSYCH:  No neurologic signs such as weakness numbness. IMM/ Allergy: No unusual infections.  Allergy .   REST of 12 system review negative except as per HPI   Past Medical History  Diagnosis Date  . HYPERLIPIDEMIA   . UNSPECIFIED ANEMIA   . LEUKOPENIA, CHRONIC   . ALLERGIC RHINITIS   . GERD   . Rosacea     facial  . RAYNAUD'S SYNDROME, HX OF     improved after cardiac meds initiated  . Hx of adenomatous polyp of colon 06/05/08  . Hx of cardiac catheterization 2008     clean coronarys DrKelly   . Hx of chest pain     neg cath remot hx of narrowwing lad in 2003 nl 2008, none in many years  . Nasal injury 06/14/2012  . Diverticulitis   .  Hepatic hemangioma 06/09/08  . Mononucleosis 1963  . Subacute thyroiditis   . IBS (irritable bowel syndrome)   . Hiatal hernia 03/2000  . Cholelithiasis 06/09/08  . Anginal pain ([place])   . Complication of anesthesia 2012    went to ICU after bilateral knees ,? unstable vital signs  . SLEEP APNEA, OBSTRUCTIVE     cpap, settings "automatic" settings 11-12  . EPICONDYLITIS, LATERAL     osteoarthritis, previous joint replacements    Family History  Problem Relation Age of Onset  . Rheum arthritis Mother   . Diabetes Mother   . Heart failure Mother   . Thrombocytopenia Mother   . Sleep apnea Mother   . Ulcers Mother     PUD  . Deep vein thrombosis Father   .  Pulmonary embolism Father   . Osteoarthritis Father   . Atrial fibrillation Father   . Dementia Father   . Sleep apnea Father   . Sleep apnea Brother   . Obesity Brother   . Sleep apnea Brother   . Obesity Brother   . Colon cancer Maternal Aunt 31  . Pancreatic cancer    . Uterine cancer    . Leukemia    . Inflammatory bowel disease      aunt  . Congenital adrenal hyperplasia Grandchild   . Atrial fibrillation Brother   . Colon cancer Maternal Aunt 90    Social History   Social History  . Marital Status: Married    Spouse Name: N/A  . Number of Children: 1  . Years of Education: N/A   Occupational History  . Physician    Social History Main Topics  . Smoking status: Never Smoker   . Smokeless tobacco: Never Used  . Alcohol Use: 8.4 oz/week    14 Glasses of wine per week     Comment: 1 glass nightly  . Drug Use: No  . Sexual Activity:    Partners: Male    Birth Control/ Protection: Surgical     Comment: TAH/BSO   Other Topics Concern  . 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       Outpatient Encounter Prescriptions as of 01/29/2016  Medication Sig  . acetaminophen (TYLENOL) 500 MG tablet Take 1,000 mg by mouth every 6 (six) hours as needed for mild pain or headache.   . Alum Hydroxide-Mag Carbonate (GAVISCON PO) Take 1 tablet by mouth 4 (four) times daily as needed (heart burn).   Marland Kitchen amLODipine (NORVASC) 5 MG tablet Take 1 tablet (5 mg total) by mouth every evening.  Marland Kitchen amoxicillin (AMOXIL) 500 MG capsule 2,000 mg. 1 hour prior to dental procedures.  Marland Kitchen aspirin 81 MG tablet Take 81 mg by mouth daily.  Marland Kitchen atorvastatin (LIPITOR) 20 MG tablet TAKE 1 TABLET (20 MG TOTAL) BY MOUTH EVERY EVENING.  . Azelaic Acid (FINACEA) 15 % cream Apply 1 application topically 2 (two) times daily. After skin is thoroughly washed and patted dry, gently but thoroughly massage a thin  film of azelaic acid cream into the affected area twice daily, in the morning and evening.  . carvedilol (COREG) 3.125 MG tablet TAKE 1 TABLET (3.125 MG TOTAL) BY MOUTH 2 (TWO) TIMES DAILY WITH A MEAL.  . celecoxib (CELEBREX) 200 MG capsule TAKE 1 CAPSULE (200 MG TOTAL) BY MOUTH DAILY.  Marland Kitchen Cholecalciferol (VITAMIN D3)  2000 UNITS TABS Take 1 tablet by mouth daily.  . clindamycin (CLEOCIN T) 1 % external solution 2 (two) times daily as needed.  . DULoxetine (CYMBALTA) 60 MG capsule TAKE 1 CAPSULE (60 MG TOTAL) BY MOUTH EVERY EVENING.  Marland Kitchen estradiol (ESTRACE) 0.5 MG tablet Take 1/2 tab daily  . fexofenadine (ALLEGRA) 180 MG tablet Take 180 mg by mouth daily. Takes in PM  . metroNIDAZOLE (METROGEL) 1 % gel Apply 1 application topically daily. Apply to face  . Multiple Vitamins-Minerals (CENTRUM ADULTS PO) Take 1 tablet by mouth daily.  . nitroGLYCERIN (NITROSTAT) 0.4 MG SL tablet Place 0.4 mg under the tongue every 5 (five) minutes as needed for chest pain.   Marland Kitchen omeprazole-sodium bicarbonate (ZEGERID) 40-1100 MG per capsule Take 1 capsule by mouth daily before breakfast.   . ramipril (ALTACE) 10 MG capsule TAKE 1 CAPSULE (10 MG TOTAL) BY MOUTH EVERY EVENING.  . valACYclovir (VALTREX) 1000 MG tablet Take 1 tablet (1,000 mg total) by mouth 2 (two) times daily as needed. For outbreaks  . Azelastine-Fluticasone (DYMISTA) 137-50 MCG/ACT SUSP Place 1-2 puffs into the nose daily.  . [DISCONTINUED] amoxicillin-clavulanate (AUGMENTIN) 875-125 MG tablet Take 1 tablet by mouth every 12 (twelve) hours. As directed for sinusitis  . [DISCONTINUED] celecoxib (CELEBREX) 200 MG capsule TAKE 1 BY MOUTH DAILY   No facility-administered encounter medications on file as of 01/29/2016.    EXAM:  BP 116/66 mmHg  Temp(Src) 98.5 F (36.9 C) (Oral)  Ht 5' 5.5" (1.664 m)  Wt 241 lb 8 oz (109.544 kg)  BMI 39.56 kg/m2  Body mass index is 39.56 kg/(m^2).  Physical Exam: Vital signs reviewed [id number] is a well-developed  well-nourished alert cooperative   who appears stated age in no acute distress.  HEENT: normocephalic atraumatic , Eyes: PERRL EOM's full, conjunctiva clear, Nares: paten,t no deformity discharge or tenderness., Ears: no deformity EAC's clear TMs with normal landmarks. Mouth: clear OP, no lesions, edema.  Dry  mucous membranes. Dentition in adequate repair. NECK: supple without masses, thyromegaly or bruits. CHEST/PULM:  Clear to auscultation and percussion breath sounds equal no wheeze , rales or rhonchi. No chest wall deformities or tenderness.Breast: normal by inspection . No dimpling, discharge, masses, tenderness or discharge . CV: PMI is nondisplaced, S1 S2 no gallops, murmurs, rubs. Peripheral pulses  without delay.No JVD .  ABDOMEN: Bowel sounds normal nontender  No guard or rebound, no hepato splenomegal no CVA tenderness.   Extremtities:  No clubbing cyanosis or edema, no acute joint swelling or redness no focal atrophy   djd changes in hands noted   NEURO:  Oriented x3, cranial nerves 3-12 appear to be intact, no obvious focal weakness,gait within normal limits no abnormal reflexes or asymmetrical SKIN: No acute rashes normal turgor, color, no bruising or petechiae. PSYCH: Oriented, good eye contact, no obvious depression anxiety, cognition and judgment appear normal. LN: no cervical axillary inguinal adenopathy No noted deficits in memory, attention, and speech.   Lab Results  Component Value Date   WBC 4.0 01/22/2016   HGB 12.8 01/22/2016   HCT 37.4 01/22/2016   PLT 211.0 01/22/2016   GLUCOSE 108* 01/22/2016   CHOL 163 01/22/2016   TRIG 108.0 01/22/2016   HDL 82.90 01/22/2016   LDLCALC 59 01/22/2016   ALT 23 01/22/2016   AST 34 01/22/2016   NA 141 01/22/2016   K 4.9 01/22/2016   CL 104 01/22/2016   CREATININE 0.68 01/22/2016   BUN 18 01/22/2016   CO2 30 01/22/2016  TSH 1.35 01/22/2016   INR 0.97 07/10/2014   HGBA1C 5.5 01/22/2016    ASSESSMENT AND  PLAN:  Discussed the following assessment and plan:  Visit for preventive health examination  Medicare annual wellness visit, subsequent  Medication management - 6 months bmp monitor and fu  Raynaud's phenomenon  Primary osteoarthritis involving multiple joints - most problematic   Hyperglycemia  HLD (hyperlipidemia)  BMI 39.0-39.9,adult Disc cbt acitivity increase  Has  Sig djd   . At ths it me stay on cymbalta  Thinks its still helping. Bmp in 6 months cause of  celebrex continuing She is not diabetic but flags as such in ehr.  Patient Care Team: Burnis Medin, MD as PCP - General Troy Sine, MD (Cardiology) Jovita Gamma, MD (Neurosurgery) Megan Salon, MD as Attending Physician (Obstetrics and Gynecology) Gaynelle Arabian, MD as Attending Physician (Orthopedic Surgery) Deneise Lever, MD (Pulmonary Disease) Bo Merino, MD as Consulting Physician (Rheumatology) Jari Pigg, MD as Consulting Physician (Dermatology) Calvert Cantor, MD as Consulting Physician (Ophthalmology) Iran Planas, MD as Consulting Physician (Orthopedic Surgery)  Patient Instructions  Advise    Water exercise walking   as discussed .    Health Maintenance, Female Adopting a healthy lifestyle and getting preventive care can go a long way to promote health and wellness. Talk with your health care provider about what schedule of regular examinations is right for you. This is a good chance for you to check in with your provider about disease prevention and staying healthy. In between checkups, there are plenty of things you can do on your own. Experts have done a lot of research about which lifestyle changes and preventive measures are most likely to keep you healthy. Ask your health care provider for more information. WEIGHT AND DIET  Eat a healthy diet  Be sure to include plenty of vegetables, fruits, low-fat dairy products, and lean protein.  Do not eat a lot of foods high in solid fats,  added sugars, or salt.  Get regular exercise. This is one of the most important things you can do for your health.  Most adults should exercise for at least 150 minutes each week. The exercise should increase your heart rate and make you sweat (moderate-intensity exercise).  Most adults should also do strengthening exercises at least twice a week. This is in addition to the moderate-intensity exercise.  Maintain a healthy weight  Body mass index (BMI) is a measurement that can be used to identify possible weight problems. It estimates body fat based on height and weight. Your health care provider can help determine your BMI and help you achieve or maintain a healthy weight.  For females 22 years of age and older:   A BMI below 18.5 is considered underweight.  A BMI of 18.5 to 24.9 is normal.  A BMI of 25 to 29.9 is considered overweight.  A BMI of 30 and above is considered obese.  Watch levels of cholesterol and blood lipids  You should start having your blood tested for lipids and cholesterol at 71 years of age, then have this test every 5 years.  You may need to have your cholesterol levels checked more often if:  Your lipid or cholesterol levels are high.  You are older than 71 years of age.  You are at high risk for heart disease.  CANCER SCREENING   Lung Cancer  Lung cancer screening is recommended for adults 10-36 years old who are at high risk for  lung cancer because of a history of smoking.  A yearly low-dose CT scan of the lungs is recommended for people who:  Currently smoke.  Have quit within the past 15 years.  Have at least a 30-pack-year history of smoking. A pack year is smoking an average of one pack of cigarettes a day for 1 year.  Yearly screening should continue until it has been 15 years since you quit.  Yearly screening should stop if you develop a health problem that would prevent you from having lung cancer treatment.  Breast  Cancer  Practice breast self-awareness. This means understanding how your breasts normally appear and feel.  It also means doing regular breast self-exams. Let your health care provider know about any changes, no matter how small.  If you are in your 20s or 30s, you should have a clinical breast exam (CBE) by a health care provider every 1-3 years as part of a regular health exam.  If you are 69 or older, have a CBE every year. Also consider having a breast X-ray (mammogram) every year.  If you have a family history of breast cancer, talk to your health care provider about genetic screening.  If you are at high risk for breast cancer, talk to your health care provider about having an MRI and a mammogram every year.  Breast cancer gene (BRCA) assessment is recommended for women who have family members with BRCA-related cancers. BRCA-related cancers include:  Breast.  Ovarian.  Tubal.  Peritoneal cancers.  Results of the assessment will determine the need for genetic counseling and BRCA1 and BRCA2 testing. Cervical Cancer Your health care provider may recommend that you be screened regularly for cancer of the pelvic organs (ovaries, uterus, and vagina). This screening involves a pelvic examination, including checking for microscopic changes to the surface of your cervix (Pap test). You may be encouraged to have this screening done every 3 years, beginning at age 33.  For women ages 86-65, health care providers may recommend pelvic exams and Pap testing every 3 years, or they may recommend the Pap and pelvic exam, combined with testing for human papilloma virus (HPV), every 5 years. Some types of HPV increase your risk of cervical cancer. Testing for HPV may also be done on women of any age with unclear Pap test results.  Other health care providers may not recommend any screening for nonpregnant women who are considered low risk for pelvic cancer and who do not have symptoms. Ask your  health care provider if a screening pelvic exam is right for you.  If you have had past treatment for cervical cancer or a condition that could lead to cancer, you need Pap tests and screening for cancer for at least 20 years after your treatment. If Pap tests have been discontinued, your risk factors (such as having a new sexual partner) need to be reassessed to determine if screening should resume. Some women have medical problems that increase the chance of getting cervical cancer. In these cases, your health care provider may recommend more frequent screening and Pap tests. Colorectal Cancer  This type of cancer can be detected and often prevented.  Routine colorectal cancer screening usually begins at 71 years of age and continues through 71 years of age.  Your health care provider may recommend screening at an earlier age if you have risk factors for colon cancer.  Your health care provider may also recommend using home test kits to check for hidden blood in the stool.  A small camera at the end of a tube can be used to examine your colon directly (sigmoidoscopy or colonoscopy). This is done to check for the earliest forms of colorectal cancer.  Routine screening usually begins at age 84.  Direct examination of the colon should be repeated every 5-10 years through 71 years of age. However, you may need to be screened more often if early forms of precancerous polyps or small growths are found. Skin Cancer  Check your skin from head to toe regularly.  Tell your health care provider about any new moles or changes in moles, especially if there is a change in a mole's shape or color.  Also tell your health care provider if you have a mole that is larger than the size of a pencil eraser.  Always use sunscreen. Apply sunscreen liberally and repeatedly throughout the day.  Protect yourself by wearing long sleeves, pants, a wide-brimmed hat, and sunglasses whenever you are outside. HEART  DISEASE, DIABETES, AND HIGH BLOOD PRESSURE   High blood pressure causes heart disease and increases the risk of stroke. High blood pressure is more likely to develop in:  People who have blood pressure in the high end of the normal range (130-139/85-89 mm Hg).  People who are overweight or obese.  People who are African American.  If you are 61-80 years of age, have your blood pressure checked every 3-5 years. If you are 30 years of age or older, have your blood pressure checked every year. You should have your blood pressure measured twice--once when you are at a hospital or clinic, and once when you are not at a hospital or clinic. Record the average of the two measurements. To check your blood pressure when you are not at a hospital or clinic, you can use:  An automated blood pressure machine at a pharmacy.  A home blood pressure monitor.  If you are between 63 years and 36 years old, ask your health care provider if you should take aspirin to prevent strokes.  Have regular diabetes screenings. This involves taking a blood sample to check your fasting blood sugar level.  If you are at a normal weight and have a low risk for diabetes, have this test once every three years after 71 years of age.  If you are overweight and have a high risk for diabetes, consider being tested at a younger age or more often. PREVENTING INFECTION  Hepatitis B  If you have a higher risk for hepatitis B, you should be screened for this virus. You are considered at high risk for hepatitis B if:  You were born in a country where hepatitis B is common. Ask your health care provider which countries are considered high risk.  Your parents were born in a high-risk country, and you have not been immunized against hepatitis B (hepatitis B vaccine).  You have HIV or AIDS.  You use needles to inject street drugs.  You live with someone who has hepatitis B.  You have had sex with someone who has hepatitis  B.  You get hemodialysis treatment.  You take certain medicines for conditions, including cancer, organ transplantation, and autoimmune conditions. Hepatitis C  Blood testing is recommended for:  Everyone born from [birth year] through 1966.  Anyone with known risk factors for hepatitis C. Sexually transmitted infections (STIs)  You should be screened for sexually transmitted infections (STIs) including gonorrhea and chlamydia if:  You are sexually active and are younger than 71 years of  age.  Dennis Bast are older than 71 years of age and your health care provider tells you that you are at risk for this type of infection.  Your sexual activity has changed since you were last screened and you are at an increased risk for chlamydia or gonorrhea. Ask your health care provider if you are at risk.  If you do not have HIV, but are at risk, it may be recommended that you take a prescription medicine daily to prevent HIV infection. This is called pre-exposure prophylaxis (PrEP). You are considered at risk if:  You are sexually active and do not regularly use condoms or know the HIV status of your partner(s).  You take drugs by injection.  You are sexually active with a partner who has HIV. Talk with your health care provider about whether you are at high risk of being infected with HIV. If you choose to begin PrEP, you should first be tested for HIV. You should then be tested every 3 months for as long as you are taking PrEP.  PREGNANCY   If you are premenopausal and you may become pregnant, ask your health care provider about preconception counseling.  If you may become pregnant, take 400 to 800 micrograms (mcg) of folic acid every day.  If you want to prevent pregnancy, talk to your health care provider about birth control (contraception). OSTEOPOROSIS AND MENOPAUSE   Osteoporosis is a disease in which the bones lose minerals and strength with aging. This can result in serious bone fractures. Your  risk for osteoporosis can be identified using a bone density scan.  If you are 33 years of age or older, or if you are at risk for osteoporosis and fractures, ask your health care provider if you should be screened.  Ask your health care provider whether you should take a calcium or vitamin D supplement to lower your risk for osteoporosis.  Menopause may have certain physical symptoms and risks.  Hormone replacement therapy may reduce some of these symptoms and risks. Talk to your health care provider about whether hormone replacement therapy is right for you.  HOME CARE INSTRUCTIONS   Schedule regular health, dental, and eye exams.  Stay current with your immunizations.   Do not use any tobacco products including cigarettes, chewing tobacco, or electronic cigarettes.  If you are pregnant, do not drink alcohol.  If you are breastfeeding, limit how much and how often you drink alcohol.  Limit alcohol intake to no more than 1 drink per day for nonpregnant women. One drink equals 12 ounces of beer, 5 ounces of wine, or 1 ounces of hard liquor.  Do not use street drugs.  Do not share needles.  Ask your health care provider for help if you need support or information about quitting drugs.  Tell your health care provider if you often feel depressed.  Tell your health care provider if you have ever been abused or do not feel safe at home.   This information is not intended to replace advice given to you by your health care provider. Make sure you discuss any questions you have with your health care provider.   Document Released: 06/06/2011 Document Revised: 12/12/2014 Document Reviewed: 10/23/2013 Elsevier Interactive Patient Education 2016 [place] K. Panosh M.D.

## 2016-01-29 NOTE — Patient Instructions (Addendum)
Advise    Water exercise walking   as discussed .    Health Maintenance, Female Adopting a healthy lifestyle and getting preventive care can go a long way to promote health and wellness. Talk with your health care provider about what schedule of regular examinations is right for you. This is a good chance for you to check in with your provider about disease prevention and staying healthy. In between checkups, there are plenty of things you can do on your own. Experts have done a lot of research about which lifestyle changes and preventive measures are most likely to keep you healthy. Ask your health care provider for more information. WEIGHT AND DIET  Eat a healthy diet  Be sure to include plenty of vegetables, fruits, low-fat dairy products, and lean protein.  Do not eat a lot of foods high in solid fats, added sugars, or salt.  Get regular exercise. This is one of the most important things you can do for your health.  Most adults should exercise for at least 150 minutes each week. The exercise should increase your heart rate and make you sweat (moderate-intensity exercise).  Most adults should also do strengthening exercises at least twice a week. This is in addition to the moderate-intensity exercise.  Maintain a healthy weight  Body mass index (BMI) is a measurement that can be used to identify possible weight problems. It estimates body fat based on height and weight. Your health care provider can help determine your BMI and help you achieve or maintain a healthy weight.  For females 28 years of age and older:   A BMI below 18.5 is considered underweight.  A BMI of 18.5 to 24.9 is normal.  A BMI of 25 to 29.9 is considered overweight.  A BMI of 30 and above is considered obese.  Watch levels of cholesterol and blood lipids  You should start having your blood tested for lipids and cholesterol at 71 years of age, then have this test every 5 years.  You may need to have your  cholesterol levels checked more often if:  Your lipid or cholesterol levels are high.  You are older than 71 years of age.  You are at high risk for heart disease.  CANCER SCREENING   Lung Cancer  Lung cancer screening is recommended for adults 69-74 years old who are at high risk for lung cancer because of a history of smoking.  A yearly low-dose CT scan of the lungs is recommended for people who:  Currently smoke.  Have quit within the past 15 years.  Have at least a 30-pack-year history of smoking. A pack year is smoking an average of one pack of cigarettes a day for 1 year.  Yearly screening should continue until it has been 15 years since you quit.  Yearly screening should stop if you develop a health problem that would prevent you from having lung cancer treatment.  Breast Cancer  Practice breast self-awareness. This means understanding how your breasts normally appear and feel.  It also means doing regular breast self-exams. Let your health care provider know about any changes, no matter how small.  If you are in your 20s or 30s, you should have a clinical breast exam (CBE) by a health care provider every 1-3 years as part of a regular health exam.  If you are 54 or older, have a CBE every year. Also consider having a breast X-ray (mammogram) every year.  If you have a family history  of breast cancer, talk to your health care provider about genetic screening.  If you are at high risk for breast cancer, talk to your health care provider about having an MRI and a mammogram every year.  Breast cancer gene (BRCA) assessment is recommended for women who have family members with BRCA-related cancers. BRCA-related cancers include:  Breast.  Ovarian.  Tubal.  Peritoneal cancers.  Results of the assessment will determine the need for genetic counseling and BRCA1 and BRCA2 testing. Cervical Cancer Your health care provider may recommend that you be screened regularly  for cancer of the pelvic organs (ovaries, uterus, and vagina). This screening involves a pelvic examination, including checking for microscopic changes to the surface of your cervix (Pap test). You may be encouraged to have this screening done every 3 years, beginning at age 80.  For women ages 74-65, health care providers may recommend pelvic exams and Pap testing every 3 years, or they may recommend the Pap and pelvic exam, combined with testing for human papilloma virus (HPV), every 5 years. Some types of HPV increase your risk of cervical cancer. Testing for HPV may also be done on women of any age with unclear Pap test results.  Other health care providers may not recommend any screening for nonpregnant women who are considered low risk for pelvic cancer and who do not have symptoms. Ask your health care provider if a screening pelvic exam is right for you.  If you have had past treatment for cervical cancer or a condition that could lead to cancer, you need Pap tests and screening for cancer for at least 20 years after your treatment. If Pap tests have been discontinued, your risk factors (such as having a new sexual partner) need to be reassessed to determine if screening should resume. Some women have medical problems that increase the chance of getting cervical cancer. In these cases, your health care provider may recommend more frequent screening and Pap tests. Colorectal Cancer  This type of cancer can be detected and often prevented.  Routine colorectal cancer screening usually begins at 71 years of age and continues through 71 years of age.  Your health care provider may recommend screening at an earlier age if you have risk factors for colon cancer.  Your health care provider may also recommend using home test kits to check for hidden blood in the stool.  A small camera at the end of a tube can be used to examine your colon directly (sigmoidoscopy or colonoscopy). This is done to check  for the earliest forms of colorectal cancer.  Routine screening usually begins at age 6.  Direct examination of the colon should be repeated every 5-10 years through 71 years of age. However, you may need to be screened more often if early forms of precancerous polyps or small growths are found. Skin Cancer  Check your skin from head to toe regularly.  Tell your health care provider about any new moles or changes in moles, especially if there is a change in a mole's shape or color.  Also tell your health care provider if you have a mole that is larger than the size of a pencil eraser.  Always use sunscreen. Apply sunscreen liberally and repeatedly throughout the day.  Protect yourself by wearing long sleeves, pants, a wide-brimmed hat, and sunglasses whenever you are outside. HEART DISEASE, DIABETES, AND HIGH BLOOD PRESSURE   High blood pressure causes heart disease and increases the risk of stroke. High blood pressure is  more likely to develop in:  People who have blood pressure in the high end of the normal range (130-139/85-89 mm Hg).  People who are overweight or obese.  People who are African American.  If you are 80-81 years of age, have your blood pressure checked every 3-5 years. If you are 90 years of age or older, have your blood pressure checked every year. You should have your blood pressure measured twice--once when you are at a hospital or clinic, and once when you are not at a hospital or clinic. Record the average of the two measurements. To check your blood pressure when you are not at a hospital or clinic, you can use:  An automated blood pressure machine at a pharmacy.  A home blood pressure monitor.  If you are between 12 years and 74 years old, ask your health care provider if you should take aspirin to prevent strokes.  Have regular diabetes screenings. This involves taking a blood sample to check your fasting blood sugar level.  If you are at a normal  weight and have a low risk for diabetes, have this test once every three years after 71 years of age.  If you are overweight and have a high risk for diabetes, consider being tested at a younger age or more often. PREVENTING INFECTION  Hepatitis B  If you have a higher risk for hepatitis B, you should be screened for this virus. You are considered at high risk for hepatitis B if:  You were born in a country where hepatitis B is common. Ask your health care provider which countries are considered high risk.  Your parents were born in a high-risk country, and you have not been immunized against hepatitis B (hepatitis B vaccine).  You have HIV or AIDS.  You use needles to inject street drugs.  You live with someone who has hepatitis B.  You have had sex with someone who has hepatitis B.  You get hemodialysis treatment.  You take certain medicines for conditions, including cancer, organ transplantation, and autoimmune conditions. Hepatitis C  Blood testing is recommended for:  Everyone born from [birth year] through 1966.  Anyone with known risk factors for hepatitis C. Sexually transmitted infections (STIs)  You should be screened for sexually transmitted infections (STIs) including gonorrhea and chlamydia if:  You are sexually active and are younger than 71 years of age.  You are older than 71 years of age and your health care provider tells you that you are at risk for this type of infection.  Your sexual activity has changed since you were last screened and you are at an increased risk for chlamydia or gonorrhea. Ask your health care provider if you are at risk.  If you do not have HIV, but are at risk, it may be recommended that you take a prescription medicine daily to prevent HIV infection. This is called pre-exposure prophylaxis (PrEP). You are considered at risk if:  You are sexually active and do not regularly use condoms or know the HIV status of your partner(s).  You take  drugs by injection.  You are sexually active with a partner who has HIV. Talk with your health care provider about whether you are at high risk of being infected with HIV. If you choose to begin PrEP, you should first be tested for HIV. You should then be tested every 3 months for as long as you are taking PrEP.  PREGNANCY   If you are premenopausal and you  may become pregnant, ask your health care provider about preconception counseling.  If you may become pregnant, take 400 to 800 micrograms (mcg) of folic acid every day.  If you want to prevent pregnancy, talk to your health care provider about birth control (contraception). OSTEOPOROSIS AND MENOPAUSE   Osteoporosis is a disease in which the bones lose minerals and strength with aging. This can result in serious bone fractures. Your risk for osteoporosis can be identified using a bone density scan.  If you are 51 years of age or older, or if you are at risk for osteoporosis and fractures, ask your health care provider if you should be screened.  Ask your health care provider whether you should take a calcium or vitamin D supplement to lower your risk for osteoporosis.  Menopause may have certain physical symptoms and risks.  Hormone replacement therapy may reduce some of these symptoms and risks. Talk to your health care provider about whether hormone replacement therapy is right for you.  HOME CARE INSTRUCTIONS   Schedule regular health, dental, and eye exams.  Stay current with your immunizations.   Do not use any tobacco products including cigarettes, chewing tobacco, or electronic cigarettes.  If you are pregnant, do not drink alcohol.  If you are breastfeeding, limit how much and how often you drink alcohol.  Limit alcohol intake to no more than 1 drink per day for nonpregnant women. One drink equals 12 ounces of beer, 5 ounces of wine, or 1 ounces of hard liquor.  Do not use street drugs.  Do not share  needles.  Ask your health care provider for help if you need support or information about quitting drugs.  Tell your health care provider if you often feel depressed.  Tell your health care provider if you have ever been abused or do not feel safe at home.   This information is not intended to replace advice given to you by your health care provider. Make sure you discuss any questions you have with your health care provider.   Document Released: 06/06/2011 Document Revised: 12/12/2014 Document Reviewed: 10/23/2013 Elsevier Interactive Patient Education [employer].

## 2016-02-08 ENCOUNTER — Other Ambulatory Visit: Payer: Self-pay | Admitting: Obstetrics & Gynecology

## 2016-02-08 ENCOUNTER — Other Ambulatory Visit: Payer: Self-pay | Admitting: Cardiovascular Disease

## 2016-02-08 MED ORDER — ESTRADIOL 0.5 MG PO TABS: ORAL_TABLET | ORAL | Status: DC

## 2016-02-08 NOTE — Telephone Encounter (Signed)
[facility] at [place], [phone]. Patient request a refill of estradiol (ESTRACE) 0.5 MG tablet.

## 2016-02-08 NOTE — Telephone Encounter (Signed)
Medication refill request: estrace 0.5Mg  Last AEX:  12/19/14 SM Next AEX: 04/22/16 SM Last MMG (if hormonal medication request): 06/15/15 [place] Bilateral [id number]:Benign  Refill authorized: 12/19/14 #[id number]. Today please advise.   Routed to Dr. Quincy Simmonds.

## 2016-02-09 ENCOUNTER — Other Ambulatory Visit: Payer: Self-pay | Admitting: Cardiovascular Disease

## 2016-02-09 ENCOUNTER — Encounter: Payer: Self-pay | Admitting: Cardiovascular Disease

## 2016-02-09 ENCOUNTER — Ambulatory Visit (INDEPENDENT_AMBULATORY_CARE_PROVIDER_SITE_OTHER): Payer: Medicare Other | Admitting: Cardiovascular Disease

## 2016-02-09 VITALS — BP 124/68 | HR 81 | Ht 66.0 in | Wt 244.1 lb

## 2016-02-09 DIAGNOSIS — E785 Hyperlipidemia, unspecified: Secondary | ICD-10-CM | POA: Diagnosis not present

## 2016-02-09 DIAGNOSIS — G4733 Obstructive sleep apnea (adult) (pediatric): Secondary | ICD-10-CM

## 2016-02-09 DIAGNOSIS — R011 Cardiac murmur, unspecified: Secondary | ICD-10-CM | POA: Diagnosis not present

## 2016-02-09 DIAGNOSIS — I2510 Atherosclerotic heart disease of native coronary artery without angina pectoris: Secondary | ICD-10-CM | POA: Diagnosis not present

## 2016-02-09 DIAGNOSIS — I7300 Raynaud's syndrome without gangrene: Secondary | ICD-10-CM

## 2016-02-09 MED ORDER — NITROGLYCERIN 0.4 MG SL SUBL: 0.4000 mg | SUBLINGUAL_TABLET | SUBLINGUAL | Status: DC | PRN

## 2016-02-09 NOTE — Progress Notes (Signed)
Patient ID: Terri Rodriguez, female   DOB: 29-Nov-1946, 70 y.o.   MRN: [id number]    Primary M.D.: Dr. Shanon Ace  HPI: Terri Rodriguez is a 70 y.o. female who presents to the office today for one-year cardiology evaluation.  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 Cardiac catheterization demonstrated a 30% tubular narrowing of the proximal LAD. She has a history of probable Raynaud's with a remote history of digital ischemia/spasm. She has been aggressively treated with medical therapy both for potential coronary vasospasm as well as lipid-lowering therapy for treatment methods to optimize endothelial function and blood pressure. She does have significant arthritic issues with degenerative disc disease and has undergone surgery involving L3-L4, L4-L5 by Dr. Sherwood Gambler. She also is status post bilateral knee surgery as well as left hip replacement. She has a history of obstructive sleep apnea on CPAP therapy, history of osteoarthritis which has been progressive. There also is a history of rosacea.    She sustained a left radius fracture and required open reduction and internal fixation in April 2015.  In August 2015, she underwent right hip replacement.  Her arthritis has limited her activity.  She has a history of obstructive sleep apnea and admits to 100% CPAP use.  She is unaware of breakthrough snoring.  She denies excessive daytime sleepiness.  Over the past year she denies recurrent episodes of chest tightness or pressure. She denies any Raynaud's phenomenon.  She has not been successful weight loss.  She is unaware of palpitations.  She does note some mild shortness of breath particularly trying to keep up with her granddaughter.  She is not very active.   Past Medical History  Diagnosis Date  . HYPERLIPIDEMIA   . UNSPECIFIED ANEMIA   . LEUKOPENIA, CHRONIC   . ALLERGIC RHINITIS   . GERD   . Rosacea     facial  . RAYNAUD'S  SYNDROME, HX OF     improved after cardiac meds initiated  . Hx of adenomatous polyp of colon 06/05/08  . Hx of cardiac catheterization 2008     clean coronarys Terri Rodriguez   . Hx of chest pain     neg cath remot hx of narrowwing lad in 2003 nl 2008, none in many years  . Nasal injury 06/14/2012  . Diverticulitis   . Hepatic hemangioma 06/09/08  . Mononucleosis 1963  . Subacute thyroiditis   . IBS (irritable bowel syndrome)   . Hiatal hernia 03/2000  . Cholelithiasis 06/09/08  . Anginal pain ([place])   . Complication of anesthesia 2012    went to ICU after bilateral knees ,? unstable vital signs  . SLEEP APNEA, OBSTRUCTIVE     cpap, settings "automatic" settings 11-12  . EPICONDYLITIS, LATERAL     osteoarthritis, previous joint replacements    Past Surgical History  Procedure Laterality Date  . Abdominal hysterectomy    . Tonsillectomy    . Cesarean section  1985, 1989  . Dilation and curettage of uterus    . Nasal septoplasty w/ turbinoplasty    . Lumbar surgery fixation with disc replacement  10/08    Left  . Knee arthroscopy      bilateral  . Replacement total knee      bilateral  . Tonsillectomy  1952  . Dental implants  11/2002,11/2011,11/2012  . Back surgery  02/06/2008  . Joint replacement  09/21/2011    bilateral knee  . Total hip arthroplasty Left  10/21/2013    Procedure: LEFT TOTAL HIP ARTHROPLASTY ANTERIOR APPROACH;  Surgeon: [name], MD;  Location: [facility];  Service: Orthopedics;  Laterality: Left;  . Nocturnal polysomnogram  12/31/2006    Severe obstructive sleep apnea. AHI-87/hr  . Cardiac catheterization  04/07/2007    Noncritical coronary artery disease. Contiue medical therapy.  . Transthoracic echocardiogram  11/09/2007    EF 33%, LV systolic function normal. Mild aortic root dilation.  . Cardiovascular stress test  11/09/2007    Moderate ischemia in Mid Anterior, Mid Anteroseptal, Apical Anterior, and Apical Septal regions. EKG negative for ischemia.  .  Cardiac catheterization  12/03/2007    Normal LV function. Mild angiographic mitral valve prolapse. Normal coronary arteries.  . Wrist surgery Left 04/03/14    ORIF "distal radial head fracture"  . Total hip arthroplasty Right 07/16/2014    Procedure: RIGHT TOTAL HIP ARTHROPLASTY ANTERIOR APPROACH;  Surgeon: [name], MD;  Location: [facility];  Service: Orthopedics;  Laterality: Right;    Allergies  Allergen Reactions  . Codeine     nausea  . Crab [Shellfish Allergy] Itching and Nausea And Vomiting    Current Outpatient Prescriptions  Medication Sig Dispense Refill  . acetaminophen (TYLENOL) 500 MG tablet Take 1,000 mg by mouth every 6 (six) hours as needed for mild pain or headache.     . Alum Hydroxide-Mag Carbonate (GAVISCON PO) Take 1 tablet by mouth 4 (four) times daily as needed (heart burn).     Marland Kitchen amLODipine (NORVASC) 5 MG tablet TAKE 1 TABLET BY MOUTH EVERY DAY 30 tablet 0  . amoxicillin (AMOXIL) 500 MG capsule 2,000 mg. 1 hour prior to dental procedures.    Marland Kitchen aspirin 81 MG tablet Take 81 mg by mouth daily.    Marland Kitchen atorvastatin (LIPITOR) 20 MG tablet TAKE 1 TABLET BY MOUTH EVERY EVENING 30 tablet 0  . Azelaic Acid (FINACEA) 15 % cream Apply 1 application topically 2 (two) times daily. After skin is thoroughly washed and patted dry, gently but thoroughly massage a thin film of azelaic acid cream into the affected area twice daily, in the morning and evening.    . carvedilol (COREG) 3.125 MG tablet TAKE 1 TABLET (3.125 MG TOTAL) BY MOUTH 2 (TWO) TIMES DAILY WITH A MEAL. 180 tablet 0  . celecoxib (CELEBREX) 200 MG capsule TAKE 1 CAPSULE (200 MG TOTAL) BY MOUTH DAILY. 90 capsule 1  . Cholecalciferol (VITAMIN D3) 2000 UNITS TABS Take 1 tablet by mouth daily.    . clindamycin (CLEOCIN T) 1 % external solution 2 (two) times daily as needed.  3  . DULoxetine (CYMBALTA) 60 MG capsule TAKE 1 CAPSULE (60 MG TOTAL) BY MOUTH EVERY EVENING. 90 capsule 1  . estradiol (ESTRACE) 0.5 MG tablet Take  1/2 tab daily 45 tablet 0  . fexofenadine (ALLEGRA) 180 MG tablet Take 180 mg by mouth daily. Takes in PM    . metroNIDAZOLE (METROGEL) 1 % gel Apply 1 application topically daily. Apply to face    . Multiple Vitamins-Minerals (CENTRUM ADULTS PO) Take 1 tablet by mouth daily.    . nitroGLYCERIN (NITROSTAT) 0.4 MG SL tablet Place 1 tablet (0.4 mg total) under the tongue every 5 (five) minutes as needed for chest pain. 25 tablet 3  . omeprazole-sodium bicarbonate (ZEGERID) 40-1100 MG per capsule Take 1 capsule by mouth daily before breakfast.     . ramipril (ALTACE) 10 MG capsule TAKE 1 CAPSULE (10 MG TOTAL) BY MOUTH EVERY EVENING. 90 capsule 0  .  valACYclovir (VALTREX) 1000 MG tablet Take 1 tablet (1,000 mg total) by mouth 2 (two) times daily as needed. For outbreaks 180 tablet 1  . Azelastine-Fluticasone (DYMISTA) 137-50 MCG/ACT SUSP Place 1-2 puffs into the nose daily. 6 g 0   No current facility-administered medications for this visit.    Social History   Social History  . Marital Status: Married    Spouse Name: N/A  . Number of Children: 1  . Years of Education: N/A   Occupational History  . Physician    Social History Main Topics  . Smoking status: Never Smoker   . Smokeless tobacco: Never Used  . Alcohol Use: 8.4 oz/week    14 Glasses of wine per week     Comment: 1 glass nightly  . Drug Use: No  . Sexual Activity:    Partners: Male    Birth Control/ Protection: Surgical     Comment: TAH/BSO   Other Topics Concern  . 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       Family History  Problem Relation Age of Onset  . Rheum arthritis Mother   . Diabetes Mother   . Heart failure Mother   . Thrombocytopenia Mother   . Sleep apnea Mother   . Ulcers Mother     PUD  . Deep vein thrombosis Father   . Pulmonary embolism Father   . Osteoarthritis Father   .  Atrial fibrillation Father   . Dementia Father   . Sleep apnea Father   . Sleep apnea Brother   . Obesity Brother   . Sleep apnea Brother   . Obesity Brother   . Colon cancer Maternal Aunt 39  . Pancreatic cancer    . Uterine cancer    . Leukemia    . Inflammatory bowel disease      aunt  . Congenital adrenal hyperplasia Grandchild   . Atrial fibrillation Brother   . Colon cancer Maternal Aunt 90    ROS General: Negative; No fevers, chills, or night sweats; positive for weight gain  HEENT: Negative; No changes in vision or hearing, sinus congestion, difficulty swallowing Pulmonary: Negative; No cough, wheezing, shortness of breath, hemoptysis Cardiovascular: Negative; No chest pain, presyncope, syncope, palpitations GI: Positive for GERD No nausea, vomiting, diarrhea, or abdominal pain GU: Negative; No dysuria, hematuria, or difficulty voiding Musculoskeletal: Positive for lumbar disc surgery, bilateral knee replacements and bilateral hip replacements Hematologic/Oncology: Negative; no easy bruising, bleeding Endocrine: Negative; no heat/cold intolerance; no diabetes Neuro: Negative; no changes in balance, headaches Skin: Negative; No rashes or skin lesions Psychiatric: Negative; No behavioral problems, depression Sleep: Positive for obstructive sleep apnea on CPAP therapy; No snoring, daytime sleepiness, hypersomnolence, bruxism, restless legs, hypnogognic hallucinations, no cataplexy Other comprehensive 14 point system review is negative.   PE BP 124/68 mmHg  Pulse 81  Ht 5' 6"  (1.676 m)  Wt 244 lb 1.6 oz (110.723 kg)  BMI 39.42 kg/m2   Wt Readings from Last 3 Encounters:  02/09/16 244 lb 1.6 oz (110.723 kg)  01/29/16 241 lb 8 oz (109.544 kg)  07/22/15 242 lb 3.2 oz (109.861 kg)   General: Alert, oriented, no distress. Moderate obesity Skin: normal turgor, no rashes HEENT: Normocephalic, atraumatic. Pupils round and reactive; sclera anicteric;no lid lag. Extraocular  muscles intact. No xanthelasmas Nose without nasal septal hypertrophy  Mouth/Parynx benign; Mallinpatti scale 3 Neck: No JVD, no carotid bruits; normal carotid upstroke Lungs: clear to ausculatation and percussion; no wheezing or rales Chest wall: no tenderness to palpitation Heart: RRR, s1 s2 normal; 4-1/9 systolic murmur along the left sternal border and apex;  no S3 or S4 gallop. No rub. No heaves. Abdomen: soft, nontender; no hepatosplenomehaly, BS+; abdominal aorta nontender and not dilated by palpation. Back: no CVA tenderness Pulses 2+ Extremities: no clubbing cyanosis or edema, Homan's sign negative  Neurologic: grossly nonfocal; cranial nerves grossly normal. Psychologic: normal affect and mood.  ECG (independently read by me): Normal sinus rhythm at 81 bpm isolate a PVC.  Nonspecific T-wave changes with previously noted T-wave inversion in V1, V2.  QTc interval 487 ms.  February 2016 ECG (independently read by me): Normal sinus rhythm at 81 bpm.  Previously noted T-wave inversion V1 and V2 which is old and has been documented for over 10 years.  Mild QTC increased at 469 ms  February 2015 ECG (independently read by me): Sinus rhythm at 76 beats per minute period. She noted T wave inversion in V1 V2 which is old.  LABS: BMP Latest Ref Rng 01/22/2016 07/15/2015 01/15/2015  Glucose 70 - 99 mg/dL 108(H) 111(H) 104(H)  BUN 6 - 23 mg/dL 18 19 21   Creatinine 0.40 - 1.20 mg/dL 0.68 0.69 0.68  Sodium 135 - 145 mEq/L 141 146(H) 141  Potassium 3.5 - 5.1 mEq/L 4.9 5.4(H) 5.0  Chloride 96 - 112 mEq/L 104 108 106  CO2 19 - 32 mEq/L 30 30 29   Calcium 8.4 - 10.5 mg/dL 9.3 9.4 9.3   Hepatic Function Latest Ref Rng 01/22/2016 01/15/2015 07/10/2014  Total Protein 6.0 - 8.3 g/dL 6.8 6.7 6.9  Albumin 3.5 - 5.2 g/dL 4.3 4.1 3.9  AST 0 - 37 U/L 34 34 34  ALT 0 - 35 U/L 23 24 22   Alk Phosphatase 39 - 117 U/L 85 99 103  Total Bilirubin 0.2 - 1.2 mg/dL 0.4 0.4 0.3  Bilirubin, Direct 0.0 - 0.3 mg/dL 0.1  - -    CBC Latest Ref Rng 01/22/2016 07/15/2015 01/15/2015  WBC 4.0 - 10.5 K/uL 4.0 3.6(L) 3.8(L)  Hemoglobin 12.0 - 15.0 g/dL 12.8 12.3 11.1(L)  Hematocrit 36.0 - 46.0 % 37.4 36.9 32.9(L)  Platelets 150.0 - 400.0 K/uL 211.0 225.0 228.0    Lab Results  Component Value Date   MCV 94.2 01/22/2016   MCV 92.8 07/15/2015   MCV 82.8 01/15/2015    Lab Results  Component Value Date   TSH 1.35 01/22/2016   Lab Results  Component Value Date   HGBA1C 5.5 01/22/2016   Lipid Panel     Component Value Date/Time   CHOL 163 01/22/2016 1046   TRIG 108.0 01/22/2016 1046   HDL 82.90 01/22/2016 1046   CHOLHDL 2 01/22/2016 1046   VLDL 21.6 01/22/2016 1046   LDLCALC 59 01/22/2016 1046     RADIOLOGY: No results found.    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 She underwent repeat cardiac catheterization in 2008 prior to her back surgery after nuclear perfusion study raise the possibility of ischemia showed normalization of coronary arteries without significant obstruction. She is doing well and denies any recent episodes of digital ischemia or rhinitis symptomatology. She does have osteoarthritic changes of her hands which have become progressive. Her pressure today is well controlled on amlodipine 5 mg, ramipril 10 mg, in addition to  her carvedilol 3.125 mg twice a day.  She is tolerating low-dose carvedilol and he denies any episodes of Raynaud's phenomenon.  On physical exam, her cardiac murmur has slightly progressed and is now 12 in the lower sternal border and apex suggesting probable MR.  Her last echo Doppler study was in 2008.  I am  recommending a follow-up 2-D echo Doppler study to reassess systolic and diastolic function as well as valvular architecture.  Her ECG continues to show her prior T-wave abnormality in V1 and V2 which is old and there is a suggestion of mild RV conduction delay.  I reviewed her  recent laboratory in detail.  Her lipid studies are excellent on atorvastatin 20 mg daily with a total cholesterol 163, HDL 82.9, LDL 59, and triglycerides at 108.  She is on Cymbalta for nerve pain in her neck which has been beneficial.  We discussed weight loss.  Her body mass index is bordering morbid obesity at 39.4 kg/m.  Weight loss was recommended.  Her exercise has been limited by both knee and hip discomfort.  She continues to use CPAP with 100% compliance and denies any hypersomnolence or awareness of breakthrough snoring.  Her sleep is restorative.  His long as she remains stable, I will see her in one year for follow-up evaluation.  I will contact her regarding her echo Doppler assessment and will see her sooner if significant changes are noted.  Time spent: 25 minutes  Troy Sine, MD, [facility]  02/09/2016 5:40 PM

## 2016-02-09 NOTE — Patient Instructions (Signed)
Your physician has requested that you have an echocardiogram. Echocardiography is a painless test that uses sound waves to create images of your heart. It provides your doctor with information about the size and shape of your heart and how well your heart's chambers and valves are working. This procedure takes approximately one hour. There are no restrictions for this procedure.  Your physician wants you to follow-up in: 1 year or sooner if needed. You will receive a reminder letter in the mail two months in advance. If you don't receive a letter, please call our office to schedule the follow-up appointment.  If you need a refill on your cardiac medications before your next appointment, please call your pharmacy.

## 2016-02-10 ENCOUNTER — Telehealth: Payer: Self-pay | Admitting: Internal Medicine

## 2016-02-10 NOTE — Telephone Encounter (Signed)
Rx request sent to pharmacy.  

## 2016-02-10 NOTE — Telephone Encounter (Signed)
Spoke with pt and pt said team health advise her to go to ED but she refuse. Pt said that she will call tomorrow if she dont feel any better

## 2016-02-10 NOTE — Telephone Encounter (Signed)
°  [facility] [place] Day - Client [place] [facility] Patient Name: Terri Rodriguez DOB: 1946-10-27 Initial Comment Caller States when she closes her right eye its painful. been going on for about a week. has blob on the eye scelera that is enlarged and reddish in color. Nurse Assessment Nurse: Chesley Noon, RN, Lattie Haw Date/Time Eilene Ghazi Time): 02/10/2016 11:44:12 AM Confirm and document reason for call. If symptomatic, describe symptoms. You must click the next button to save text entered. ---Caller states she is having right eye pain. She has eye redness and a bleb on her eye that is filled with clear fluid. She has had it about 10 days. Eye is not itchy. Feels like foreign object in the eye. Has the patient traveled out of the country within the last 30 days? ---Not Applicable Does the patient have any new or worsening symptoms? ---Yes Will a triage be completed? ---Yes Related visit to physician within the last 2 weeks? ---No Does the PT have any chronic conditions? (i.e. diabetes, asthma, etc.) ---Yes List chronic conditions. ---arthritis Is this a behavioral health or substance abuse call? ---No Guidelines Guideline Title Affirmed Question Affirmed Notes Eye Pain Ulcer or sore seen on the cornea (clear center part of the eye) Final Disposition User Go to ED Now (or PCP triage) Chesley Noon, RN, [place] REFUSED Disagree/Comply: Disagree Disagree/Comply Reason: Disagree with instructions

## 2016-02-19 ENCOUNTER — Ambulatory Visit: Payer: Medicare Other | Admitting: Obstetrics & Gynecology

## 2016-03-03 ENCOUNTER — Telehealth: Payer: Self-pay

## 2016-03-03 ENCOUNTER — Telehealth: Payer: Self-pay | Admitting: Internal Medicine

## 2016-03-03 NOTE — Telephone Encounter (Signed)
I have filled out the information on [email or web address] for her prior auth on Celebrex. Can take up to 72 hours for a response back

## 2016-03-03 NOTE — Telephone Encounter (Signed)
PA for celebrex has been approved from 03-03-16 thru 03-03-17. [employer] PA dept will fax over the approval letter

## 2016-03-04 ENCOUNTER — Other Ambulatory Visit: Payer: Self-pay | Admitting: Cardiovascular Disease

## 2016-03-04 NOTE — Telephone Encounter (Signed)
Rx(s) sent to pharmacy electronically.  

## 2016-03-07 ENCOUNTER — Ambulatory Visit (HOSPITAL_COMMUNITY): Payer: Medicare Other | Attending: Cardiovascular Disease

## 2016-03-07 ENCOUNTER — Other Ambulatory Visit: Payer: Self-pay

## 2016-03-07 DIAGNOSIS — R011 Cardiac murmur, unspecified: Secondary | ICD-10-CM | POA: Insufficient documentation

## 2016-03-07 DIAGNOSIS — I2510 Atherosclerotic heart disease of native coronary artery without angina pectoris: Secondary | ICD-10-CM | POA: Insufficient documentation

## 2016-03-07 DIAGNOSIS — I351 Nonrheumatic aortic (valve) insufficiency: Secondary | ICD-10-CM | POA: Insufficient documentation

## 2016-03-10 ENCOUNTER — Other Ambulatory Visit: Payer: Self-pay | Admitting: Obstetrics and Gynecology

## 2016-03-10 NOTE — Telephone Encounter (Signed)
Medication refill request: estradiol 0.5mg  tablet Last AEX:  12-19-14 Next AEX: 04-22-16 Last MMG (if hormonal medication request): 06-15-15 with u/s left breast category 2 benign. Refill authorized: last written 02-08-16 takes 1/2 tablet daily. Pt should still have pills left. rx denied

## 2016-03-12 ENCOUNTER — Other Ambulatory Visit: Payer: Self-pay | Admitting: Obstetrics and Gynecology

## 2016-03-14 NOTE — Telephone Encounter (Signed)
Medication refill request: estrace 0.5 Last AEX: 12/19/14 Next AEX: 04/22/16 Last MMG (if hormonal medication request): 06/15/15 BILATERAL US [id number] benign Refill authorized: 02/08/16 #[id number] w/0refills today please advise

## 2016-04-21 ENCOUNTER — Other Ambulatory Visit: Payer: Self-pay | Admitting: Internal Medicine

## 2016-04-22 ENCOUNTER — Ambulatory Visit (INDEPENDENT_AMBULATORY_CARE_PROVIDER_SITE_OTHER): Payer: Medicare Other | Admitting: Obstetrics & Gynecology

## 2016-04-22 ENCOUNTER — Encounter: Payer: Self-pay | Admitting: Obstetrics & Gynecology

## 2016-04-22 VITALS — BP 102/74 | HR 80 | Resp 14 | Ht 65.25 in | Wt 238.8 lb

## 2016-04-22 DIAGNOSIS — Z01419 Encounter for gynecological examination (general) (routine) without abnormal findings: Secondary | ICD-10-CM

## 2016-04-22 MED ORDER — ESTRADIOL 0.5 MG PO TABS: 0.2500 mg | ORAL_TABLET | Freq: Every day | ORAL | Status: DC

## 2016-04-22 NOTE — Progress Notes (Signed)
70 y.o. G3P1 MarriedCaucasianF here for annual exam.  Doing well.  No vaginal bleeding.  Having libido issues.  Would like to use topical testosterone if appropriate.  No LMP recorded. Patient has had a hysterectomy.          Sexually active: Yes.    The current method of family planning is status post hysterectomy.    Exercising: Yes.    bike Smoker:  no  Health Maintenance: Pap: 2011 History of abnormal Pap:  no MMG: 06/15/2015 needed follow up.  Follow up one year. Colonoscopy:  02/05/2014 repeat 5-7 years BMD:   05/09/2014  TDaP:  01/10/2014  Pneumonia vaccines completed  Screening Labs: PCP, Hb today: PCP, Urine today: PCP   reports that she has never smoked. She has never used smokeless tobacco. She reports that she drinks about 8.4 oz of alcohol per week. She reports that she does not use illicit drugs.  Past Medical History  Diagnosis Date  . HYPERLIPIDEMIA   . UNSPECIFIED ANEMIA   . LEUKOPENIA, CHRONIC   . ALLERGIC RHINITIS   . GERD   . Rosacea     facial  . RAYNAUD'S SYNDROME, HX OF     improved after cardiac meds initiated  . Hx of adenomatous polyp of colon 06/05/08  . Hx of cardiac catheterization 2008     clean coronarys DrKelly   . Hx of chest pain     neg cath remot hx of narrowwing lad in 2003 nl 2008, none in many years  . Nasal injury 06/14/2012  . Diverticulitis   . Hepatic hemangioma 06/09/08  . Mononucleosis 1963  . Subacute thyroiditis   . IBS (irritable bowel syndrome)   . Hiatal hernia 03/2000  . Cholelithiasis 06/09/08  . Anginal pain ([place])   . Complication of anesthesia 2012    went to ICU after bilateral knees ,? unstable vital signs  . SLEEP APNEA, OBSTRUCTIVE     cpap, settings "automatic" settings 11-12  . EPICONDYLITIS, LATERAL     osteoarthritis, previous joint replacements    Past Surgical History  Procedure Laterality Date  . Abdominal hysterectomy    . Tonsillectomy    . Cesarean section  1985, 1989  . Dilation and curettage of  uterus    . Nasal septoplasty w/ turbinoplasty    . Lumbar surgery fixation with disc replacement  10/08    Left  . Knee arthroscopy      bilateral  . Replacement total knee      bilateral  . Tonsillectomy  1952  . Dental implants  11/2002,11/2011,11/2012  . Back surgery  02/06/2008  . Joint replacement  09/21/2011    bilateral knee  . Total hip arthroplasty Left 10/21/2013    Procedure: LEFT TOTAL HIP ARTHROPLASTY ANTERIOR APPROACH;  Surgeon: [name], MD;  Location: [facility];  Service: Orthopedics;  Laterality: Left;  . Nocturnal polysomnogram  12/31/2006    Severe obstructive sleep apnea. AHI-87/hr  . Cardiac catheterization  04/07/2007    Noncritical coronary artery disease. Contiue medical therapy.  . Transthoracic echocardiogram  11/09/2007    EF [id number], LV systolic function normal. Mild aortic root dilation.  . Cardiovascular stress test  11/09/2007    Moderate ischemia in Mid Anterior, Mid Anteroseptal, Apical Anterior, and Apical Septal regions. EKG negative for ischemia.  . Cardiac catheterization  12/03/2007    Normal LV function. Mild angiographic mitral valve prolapse. Normal coronary arteries.  . Wrist surgery Left 04/03/14    ORIF "distal radial  head fracture"  . Total hip arthroplasty Right 07/16/2014    Procedure: RIGHT TOTAL HIP ARTHROPLASTY ANTERIOR APPROACH;  Surgeon: [name], MD;  Location: [facility];  Service: Orthopedics;  Laterality: Right;    Current Outpatient Prescriptions  Medication Sig Dispense Refill  . acetaminophen (TYLENOL) 500 MG tablet Take 1,000 mg by mouth every 6 (six) hours as needed for mild pain or headache.     . Alum Hydroxide-Mag Carbonate (GAVISCON PO) Take 1 tablet by mouth 4 (four) times daily as needed (heart burn).     Marland Kitchen amLODipine (NORVASC) 5 MG tablet Take 1 tablet (5 mg total) by mouth daily. 30 tablet 11  . amoxicillin (AMOXIL) 500 MG capsule 2,000 mg. 1 hour prior to dental procedures.    Marland Kitchen aspirin 81 MG tablet Take 81 mg by  mouth daily.    Marland Kitchen atorvastatin (LIPITOR) 20 MG tablet Take 1 tablet (20 mg total) by mouth every evening. 30 tablet 11  . Azelaic Acid (FINACEA) 15 % cream Apply 1 application topically 2 (two) times daily. After skin is thoroughly washed and patted dry, gently but thoroughly massage a thin film of azelaic acid cream into the affected area twice daily, in the morning and evening.    . Azelastine-Fluticasone (DYMISTA) 137-50 MCG/ACT SUSP Place 1-2 puffs into the nose daily. 6 g 0  . carvedilol (COREG) 3.125 MG tablet TAKE 1 TABLET (3.125 MG TOTAL) BY MOUTH 2 (TWO) TIMES DAILY WITH A MEAL. 180 tablet 0  . celecoxib (CELEBREX) 200 MG capsule TAKE 1 CAPSULE BY MOUTH DAILY 90 capsule 0  . Cholecalciferol (VITAMIN D3) 2000 UNITS TABS Take 1 tablet by mouth daily.    . clindamycin (CLEOCIN T) 1 % external solution 2 (two) times daily as needed.  3  . DULoxetine (CYMBALTA) 60 MG capsule TAKE 1 CAPSULE (60 MG TOTAL) BY MOUTH EVERY EVENING. 90 capsule 1  . estradiol (ESTRACE) 0.5 MG tablet TAKE 1/2 TABLET BY MOUTH DAILY 45 tablet 0  . fexofenadine (ALLEGRA) 180 MG tablet Take 180 mg by mouth daily. Takes in PM    . metroNIDAZOLE (METROGEL) 1 % gel Apply 1 application topically daily. Apply to face    . Multiple Vitamins-Minerals (CENTRUM ADULTS PO) Take 1 tablet by mouth daily.    . nitroGLYCERIN (NITROSTAT) 0.4 MG SL tablet PLACE 1 TABLET UNDER THE TONGUE EVERY 5 MINUTES AS NEEDED FOR CHEST PAIN 275 tablet 3  . omeprazole-sodium bicarbonate (ZEGERID) 40-1100 MG per capsule Take 1 capsule by mouth daily before breakfast.     . ramipril (ALTACE) 10 MG capsule TAKE 1 CAPSULE (10 MG TOTAL) BY MOUTH EVERY EVENING. 90 capsule 0  . valACYclovir (VALTREX) 1000 MG tablet Take 1 tablet (1,000 mg total) by mouth 2 (two) times daily as needed. For outbreaks 180 tablet 1   No current facility-administered medications for this visit.    Family History  Problem Relation Age of Onset  . Rheum arthritis Mother   .  Diabetes Mother   . Heart failure Mother   . Thrombocytopenia Mother   . Sleep apnea Mother   . Ulcers Mother     PUD  . Deep vein thrombosis Father   . Pulmonary embolism Father   . Osteoarthritis Father   . Atrial fibrillation Father   . Dementia Father   . Sleep apnea Father   . Sleep apnea Brother   . Obesity Brother   . Sleep apnea Brother   . Obesity Brother   . Colon  cancer Maternal Aunt 59  . Pancreatic cancer    . Uterine cancer    . Leukemia    . Inflammatory bowel disease      aunt  . Congenital adrenal hyperplasia Grandchild   . Atrial fibrillation Brother   . Colon cancer Maternal Aunt 90    ROS:  Pertinent items are noted in HPI.  Otherwise, a comprehensive ROS was negative.  Exam:   Filed Vitals:   04/22/16 1253  BP: 102/74  Pulse: 80  Resp: 14   General appearance: alert, cooperative and appears stated age Head: Normocephalic, without obvious abnormality, atraumatic Neck: no adenopathy, supple, symmetrical, trachea midline and thyroid normal to inspection and palpation Lungs: clear to auscultation bilaterally Breasts: normal appearance, no masses or tenderness Heart: regular rate and rhythm Abdomen: soft, non-tender; bowel sounds normal; no masses,  no organomegaly Extremities: extremities normal, atraumatic, no cyanosis or edema Skin: Skin color, texture, turgor normal. No rashes or lesions Lymph nodes: Cervical, supraclavicular, and axillary nodes normal. No abnormal inguinal nodes palpated Neurologic: Grossly normal   Pelvic: External genitalia:  no lesions              Urethra:  normal appearing urethra with no masses, tenderness or lesions              Bartholins and Skenes: normal                 Vagina: normal appearing vagina with normal color and discharge, no lesions              Cervix: absent              Pap taken: No. Bimanual Exam:  Uterus:  uterus absent              Adnexa: no mass, fullness, tenderness                Rectovaginal: Confirms               Anus:  normal sphincter tone, no lesions  Chaperone was present for exam.  A:  Well Woman with normal exam PMP, on low dose HRT H/O colonic polyps Decreased libido  P: Mammogram yearly.  Had additional imaging last year.  Follow up due 1 year which would be July, 2017 Pap smear not indicated due to hx of TAH/BSO 1993 On estradiol 0.5mg , 1/2 tab daily. #45/4RF Labs with Dr. Regis Bill in February Topical testosterone 2% topically twice weekly.  #60grams/0RF. return annually or pr

## 2016-06-10 ENCOUNTER — Other Ambulatory Visit: Payer: Self-pay | Admitting: Internal Medicine

## 2016-06-10 NOTE — Telephone Encounter (Signed)
Sent to the pharmacy by e-scribe. 

## 2016-06-21 ENCOUNTER — Ambulatory Visit: Payer: Medicare Other | Admitting: Internal Medicine

## 2016-06-24 ENCOUNTER — Ambulatory Visit: Payer: Medicare Other | Admitting: Internal Medicine

## 2016-06-29 LAB — HM MAMMOGRAPHY

## 2016-06-30 ENCOUNTER — Ambulatory Visit (INDEPENDENT_AMBULATORY_CARE_PROVIDER_SITE_OTHER): Payer: Medicare Other | Admitting: Internal Medicine

## 2016-06-30 ENCOUNTER — Encounter: Payer: Self-pay | Admitting: Internal Medicine

## 2016-06-30 DIAGNOSIS — G4733 Obstructive sleep apnea (adult) (pediatric): Secondary | ICD-10-CM | POA: Diagnosis not present

## 2016-06-30 NOTE — Patient Instructions (Addendum)
Order- DME [facility]- continue CPAP auto 4-20, mask of choice, humidifier, supplies, AirView   Dx OSA        Please evaluate eligibility for replacement of old machine   Please call if we can help

## 2016-06-30 NOTE — Progress Notes (Signed)
TT:[id number]- 5 yoF previously followed for OSA-FOLLOWS FOR:  Wearing CPAP 6-8 hours per night.  Needs order for new supplies [facility] NPSG 12/28/06- Severe OSA, AHI 87/ hr  Good CPAP compliance and control/ [facility] on AutoSet 4-20. Likes it "fine", used all night, every night.  Hx of Printzmetal's angina, but controlled and denies active heart or lung problems.  06/22/15- 60 yoF never snmoker followed for OSA. Complicated by hx Printzmetal's angina, obesity Good CPAP compliance and control/ [facility] on AutoSet 4-20 Follows For: Uses CPAP nightly approx 7-8 hours nightly. States mask fits fine and no new complaints.   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 Pt will need order for pillows sent to [facility]. Pt wears CPAP nightly and DL attached. She says she won't even nap without her CPAP now. Very comfortable with pressure range Quality of life definitely improved.  ROS-see HPI Constitutional:   No-   weight loss, night sweats, fevers, chills, fatigue, lassitude. HEENT:   No-  headaches, difficulty swallowing, tooth/dental problems, sore throat,       No-  sneezing, itching, ear ache, nasal congestion, post nasal drip,  CV:  No-   chest pain, orthopnea, PND, swelling in lower extremities, anasarca,                                                 dizziness, palpitations Resp: No-   shortness of breath with exertion or at rest.              No-   productive cough,  No non-productive cough,  No- coughing up of blood.              No-   change in color of mucus.  No- wheezing.   Skin: No-   rash or lesions. GI:  No-   heartburn, indigestion, abdominal pain, nausea, vomiting,  GU:  MS:  +joint pain or swelling.  Neuro-     nothing unusual Psych:  No- change in mood or affect. No depression or anxiety.  No memory loss.  OBJ- Physical Exam General- Alert, Oriented,  Affect-appropriate, Distress- none acute, +overweight Skin- rash-none, lesions- none, excoriation- none Lymphadenopathy- none Head- atraumatic            Eyes- Gross vision intact, PERRLA, conjunctivae and secretions clear            Ears- Hearing, canals-normal            Nose- Clear, no-Septal dev, mucus, polyps, erosion, perforation             Throat- Mallampati III-IV , mucosa clear , drainage- none, tonsils- atrophic Neck- flexible , trachea midline, no stridor , thyroid nl, carotid no bruit Chest - symmetrical excursion , unlabored           Heart/CV- RRR , no murmur , no gallop  , no rub, nl s1 s2                           - JVD- none , edema- none, stasis changes- none, varices- none           Lung- clear to P&A, wheeze- none, cough- none , dullness-none, rub- none  Chest wall-  Abd-  Br/ Gen/ Rectal- Not done, not indicated Extrem- cyanosis- none, clubbing, none, atrophy- none, strength- nl Neuro- grossly intact to observation

## 2016-07-04 ENCOUNTER — Encounter: Payer: Self-pay | Admitting: Family Medicine

## 2016-07-07 ENCOUNTER — Encounter: Payer: Self-pay | Admitting: Internal Medicine

## 2016-07-15 ENCOUNTER — Other Ambulatory Visit: Payer: Self-pay | Admitting: Internal Medicine

## 2016-07-15 NOTE — Telephone Encounter (Signed)
Sent to the pharmacy by e-scribe for 90 days.  Pt has upcoming appt on 07/29/16

## 2016-07-20 ENCOUNTER — Other Ambulatory Visit (INDEPENDENT_AMBULATORY_CARE_PROVIDER_SITE_OTHER): Payer: Medicare Other

## 2016-07-20 DIAGNOSIS — I10 Essential (primary) hypertension: Secondary | ICD-10-CM | POA: Diagnosis not present

## 2016-07-20 LAB — BASIC METABOLIC PANEL
BUN: 20 mg/dL (ref 6–23)
CO2: 28 mEq/L (ref 19–32)
Calcium: 9.2 mg/dL (ref 8.4–10.5)
Chloride: 103 mEq/L (ref 96–112)
Creatinine, Ser: 0.7 mg/dL (ref 0.40–1.20)
GFR: 87.56 mL/min (ref >60.00)
Glucose, Bld: 126 mg/dL — ABNORMAL HIGH (ref 70–99)
Potassium: 4.5 mEq/L (ref 3.5–5.1)
Sodium: 138 mEq/L (ref 135–145)

## 2016-07-22 ENCOUNTER — Encounter: Payer: Self-pay | Admitting: Internal Medicine

## 2016-07-28 NOTE — Progress Notes (Signed)
Pre visit review using our clinic review tool, if applicable. No additional management support is needed unless otherwise documented below in the visit note.  Chief Complaint  Patient presents with  . Follow-up    HPI: Terri Rodriguez 70 y.o. comes in for 6 month check medicine labs. 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 She's not doing regular exercise for many reasons but no she will do that. No change in her health status sees cardiology once a year no acute findings. ROS: See pertinent positives and negatives per HPI. No cp sob new sx  arhtirtis is the most problematic  Past Medical History:  Diagnosis Date  . ALLERGIC RHINITIS   . Anginal pain ([place])   . Cholelithiasis 06/09/08  . Complication of anesthesia 2012   went to ICU after bilateral knees ,? unstable vital signs  . Diverticulitis   . EPICONDYLITIS, LATERAL    osteoarthritis, previous joint replacements  . GERD   . Hepatic hemangioma 06/09/08  . Hiatal hernia 03/2000  . Hx of adenomatous polyp of colon 06/05/08  . Hx of cardiac catheterization 2008    clean coronarys DrKelly   . Hx of chest pain    neg cath remot hx of narrowwing lad in 2003 nl 2008, none in many years  . HYPERLIPIDEMIA   . IBS (irritable bowel syndrome)   . LEUKOPENIA, CHRONIC   . Mononucleosis 1963  . Nasal injury 06/14/2012  . RAYNAUD'S SYNDROME, HX OF    improved after cardiac meds initiated  . Rosacea    facial  . SLEEP APNEA, OBSTRUCTIVE    cpap, settings "automatic" settings 11-12  . Subacute thyroiditis   . UNSPECIFIED ANEMIA     Family History  Problem Relation Age of Onset  . Rheum arthritis Mother   . Diabetes Mother   . Heart failure Mother   . Thrombocytopenia Mother   . Sleep apnea Mother   . Ulcers Mother     PUD  . Deep vein thrombosis Father   . Pulmonary  embolism Father   . Osteoarthritis Father   . Atrial fibrillation Father   . Dementia Father   . Sleep apnea Father   . Sleep apnea Brother   . Obesity Brother   . Sleep apnea Brother   . Obesity Brother   . Colon cancer Maternal Aunt 5  . Pancreatic cancer    . Uterine cancer    . Leukemia    . Inflammatory bowel disease      aunt  . Congenital adrenal hyperplasia Grandchild   . Atrial fibrillation Brother   . Colon cancer Maternal Aunt 90    Social History   Social History  . Marital status: Married    Spouse name: N/A  . Number of children: 1  . Years of education: N/A   Occupational History  . Physician    Social History Main Topics  . Smoking status: Never Smoker  . Smokeless tobacco: Never Used  . Alcohol use 8.4 oz/week    14 Glasses of wine per week     Comment: 1 glass nightly  . Drug use: No  . Sexual activity: Yes    Partners: Male    Birth control/ protection: Surgical     Comment: TAH/BSO   Other Topics Concern  . 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       Outpatient Medications Prior to Visit  Medication Sig Dispense Refill  . acetaminophen (TYLENOL) 500 MG tablet Take 1,000 mg by mouth every 6 (six) hours as needed for mild pain or headache.     . Alum Hydroxide-Mag Carbonate (GAVISCON PO) Take 1 tablet by mouth 4 (four) times daily as needed (heart burn).     Marland Kitchen amLODipine (NORVASC) 5 MG tablet Take 1 tablet (5 mg total) by mouth daily. 30 tablet 11  . amoxicillin (AMOXIL) 500 MG capsule 2,000 mg. 1 hour prior to dental procedures.    Marland Kitchen aspirin 81 MG tablet Take 81 mg by mouth daily.    Marland Kitchen atorvastatin (LIPITOR) 20 MG tablet Take 1 tablet (20 mg total) by mouth every evening. 30 tablet 11  . Azelaic Acid (FINACEA) 15 % cream Apply 1 application topically 2 (two) times daily. After skin is thoroughly washed and patted dry, gently but  thoroughly massage a thin film of azelaic acid cream into the affected area twice daily, in the morning and evening.    . carvedilol (COREG) 3.125 MG tablet TAKE 1 TABLET (3.125 MG TOTAL) BY MOUTH 2 (TWO) TIMES DAILY WITH A MEAL. 180 tablet 0  . celecoxib (CELEBREX) 200 MG capsule TAKE 1 CAPSULE BY MOUTH DAILY 90 capsule 0  . Cholecalciferol (VITAMIN D3) 2000 UNITS TABS Take 1 tablet by mouth daily.    . clindamycin (CLEOCIN T) 1 % external solution 2 (two) times daily as needed.  3  . DULoxetine (CYMBALTA) 60 MG capsule TAKE 1 CAPSULE BY MOUTH EVERY EVENING 90 capsule 1  . estradiol (ESTRACE) 0.5 MG tablet Take 0.5 tablets (0.25 mg total) by mouth daily. 45 tablet 4  . fexofenadine (ALLEGRA) 180 MG tablet Take 180 mg by mouth daily. Takes in PM    . metroNIDAZOLE (METROGEL) 1 % gel Apply 1 application topically daily. Apply to face    . Multiple Vitamins-Minerals (CENTRUM ADULTS PO) Take 1 tablet by mouth daily.    . nitroGLYCERIN (NITROSTAT) 0.4 MG SL tablet PLACE 1 TABLET UNDER THE TONGUE EVERY 5 MINUTES AS NEEDED FOR CHEST PAIN 275 tablet 3  . omeprazole-sodium bicarbonate (ZEGERID) 40-1100 MG per capsule Take 1 capsule by mouth daily before breakfast.     . ramipril (ALTACE) 10 MG capsule TAKE 1 CAPSULE (10 MG TOTAL) BY MOUTH EVERY EVENING. 90 capsule 0  . valACYclovir (VALTREX) 1000 MG tablet Take 1 tablet (1,000 mg total) by mouth 2 (two) times daily as needed. For outbreaks 180 tablet 1   No facility-administered medications prior to visit.      EXAM:  BP 110/60 (BP Location: Right Arm, Patient Position: Sitting, Cuff Size: Large)   Temp 97.8 F (36.6 C) (Oral)   Wt 243 lb 12.8 oz (110.6 kg)   BMI 39.35 kg/m   Body mass index is 39.35 kg/m.  GENERAL: vitals reviewed and listed above, alert, oriented, appears well hydrated and in no acute distress HEENT: atraumatic, conjunctiva  clear, no obvious abnormalities on inspection of external nose and ears NECK: no obvious masses on  inspection palpation  LUNGS: clear to auscultation bilaterally, no wheezes, rales or rhonchi, good air movement CV: HRRR, no clubbing cyanosis or  peripheral edema nl cap refill  MS: moves all extremities  Sig djd changes  In hands  Gait mildy antalgic  PSYCH: oriented  cooperative, no obvious depression or anxiety Lab Results  Component Value Date   WBC 4.0 01/22/2016   HGB 12.8 01/22/2016   HCT 37.4 01/22/2016   PLT 211.0 01/22/2016   GLUCOSE 126 (H) 07/20/2016   CHOL 163 01/22/2016   TRIG 108.0 01/22/2016   HDL 82.90 01/22/2016   LDLCALC 59 01/22/2016   ALT 23 01/22/2016   AST 34 01/22/2016   NA 138 07/20/2016   K 4.5 07/20/2016   CL 103 07/20/2016   CREATININE 0.70 07/20/2016   BUN 20 07/20/2016   CO2 28 07/20/2016   TSH 1.35 01/22/2016   INR 0.97 07/10/2014   HGBA1C 5.3 07/29/2016   BP Readings from Last 3 Encounters:  07/29/16 110/60  06/30/16 124/78  04/22/16 102/74   Wt Readings from Last 3 Encounters:  07/29/16 243 lb 12.8 oz (110.6 kg)  06/30/16 244 lb (110.7 kg)  04/22/16 238 lb 12.8 oz (108.3 kg)     ASSESSMENT AND PLAN:  Discussed the following assessment and plan:  Hyperglycemia - Plan: POCT A1C  Primary osteoarthritis involving multiple joints  Medication management  Need for prophylactic vaccination and inoculation against influenza - Plan: Flu Vaccine QUAD 36+ mos PF IM (Fluarix & Fluzone Quad PF)  BMI 39.0-39.9,adult  Medication monitoring encounter Renal function stable   6 months wellnes preventive  Etc   A1c in the nl  range. Discussed at length other activities may be good for her that she is aware such as water exercise water aerobics I think it would be good for her overall health but she has to decide she wants to follow through with. No change in medicines otherwise. Ensure it is frustrating for her to not be able to play the piano do other things motor wise he she used to be able to do.  Try limit screen time also  Flu vaccine  today. -Patient advised to return or notify health care team  if symptoms worsen ,persist or new concerns arise. Total visit 60mins > 50% spent counseling and coordinating care as indicated in above note and in instructions to patient .   Patient Instructions  Water exercise   For reasons discussed .  Renal function  Is stable .   cpx preventive visit with labs in  6 months or as needed.       Standley Brooking. Rechel Delosreyes M.D.

## 2016-07-29 ENCOUNTER — Encounter: Payer: Self-pay | Admitting: Internal Medicine

## 2016-07-29 ENCOUNTER — Ambulatory Visit (INDEPENDENT_AMBULATORY_CARE_PROVIDER_SITE_OTHER): Payer: Medicare Other | Admitting: Internal Medicine

## 2016-07-29 VITALS — BP 110/60 | Temp 97.8°F | Wt 243.8 lb

## 2016-07-29 DIAGNOSIS — Z6839 Body mass index (BMI) 39.0-39.9, adult: Secondary | ICD-10-CM

## 2016-07-29 DIAGNOSIS — Z79899 Other long term (current) drug therapy: Secondary | ICD-10-CM | POA: Diagnosis not present

## 2016-07-29 DIAGNOSIS — R739 Hyperglycemia, unspecified: Secondary | ICD-10-CM | POA: Diagnosis not present

## 2016-07-29 DIAGNOSIS — M159 Polyosteoarthritis, unspecified: Secondary | ICD-10-CM

## 2016-07-29 DIAGNOSIS — M150 Primary generalized (osteo)arthritis: Secondary | ICD-10-CM | POA: Diagnosis not present

## 2016-07-29 DIAGNOSIS — Z23 Encounter for immunization: Secondary | ICD-10-CM

## 2016-07-29 DIAGNOSIS — Z5181 Encounter for therapeutic drug level monitoring: Secondary | ICD-10-CM

## 2016-07-29 LAB — POCT GLYCOSYLATED HEMOGLOBIN (HGB A1C): Hemoglobin A1C: 5.3

## 2016-07-29 NOTE — Patient Instructions (Addendum)
Water exercise   For reasons discussed .  Renal function  Is stable .   cpx preventive visit with labs in  6 months or as needed.

## 2016-08-10 ENCOUNTER — Encounter: Payer: Self-pay | Admitting: Obstetrics & Gynecology

## 2016-08-23 NOTE — Assessment & Plan Note (Signed)
Auto 4-18/[facility]. Download confirms good compliance and control. We discussed goals, comfort, good sleep habits.

## 2016-09-07 ENCOUNTER — Other Ambulatory Visit: Payer: Self-pay | Admitting: Cardiovascular Disease

## 2016-10-05 ENCOUNTER — Other Ambulatory Visit: Payer: Self-pay | Admitting: Internal Medicine

## 2016-10-07 NOTE — Telephone Encounter (Signed)
Sent to the pharmacy by e-scribe for 6 months.  Pt has upcoming cpx on 02/02/16.

## 2016-12-02 ENCOUNTER — Other Ambulatory Visit: Payer: Self-pay | Admitting: Internal Medicine

## 2016-12-06 NOTE — Telephone Encounter (Signed)
Sent to the pharmacy by e-scribe for 90 days.  Pt has upcoming cpx on 02/01/17.   HPI: Terri Rodriguez 71 y.o. comes in for 6 month check medicine labs. 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 She's not doing regular exercise for many reasons but no she will do that. No change in her health status sees cardiology once a year no acute findings. ROS: See pertinent positives and negatives per HPI. No cp sob new sx  arhtirtis is the most problematic Taken from OV on 07/29/16 (last ov).

## 2017-01-19 DIAGNOSIS — N6489 Other specified disorders of breast: Secondary | ICD-10-CM | POA: Diagnosis not present

## 2017-01-25 ENCOUNTER — Other Ambulatory Visit (INDEPENDENT_AMBULATORY_CARE_PROVIDER_SITE_OTHER): Payer: Medicare Other

## 2017-01-25 DIAGNOSIS — Z0000 Encounter for general adult medical examination without abnormal findings: Secondary | ICD-10-CM

## 2017-01-25 LAB — LIPID PANEL
Cholesterol: 165 mg/dL (ref 0–200)
HDL: 76.1 mg/dL (ref >39.00)
LDL Cholesterol: 73 mg/dL (ref 0–99)
NonHDL: 89.39
Total CHOL/HDL Ratio: 2
Triglycerides: 80 mg/dL (ref 0.0–149.0)
VLDL: 16 mg/dL (ref 0.0–40.0)

## 2017-01-25 LAB — BASIC METABOLIC PANEL
BUN: 20 mg/dL (ref 6–23)
CO2: 27 mEq/L (ref 19–32)
Calcium: 9 mg/dL (ref 8.4–10.5)
Chloride: 106 mEq/L (ref 96–112)
Creatinine, Ser: 0.6 mg/dL (ref 0.40–1.20)
GFR: 104.45 mL/min (ref >60.00)
Glucose, Bld: 93 mg/dL (ref 70–99)
Potassium: 4.2 mEq/L (ref 3.5–5.1)
Sodium: 140 mEq/L (ref 135–145)

## 2017-01-25 LAB — HEPATIC FUNCTION PANEL
ALT: 30 U/L (ref 0–35)
AST: 42 U/L — ABNORMAL HIGH (ref 0–37)
Albumin: 4.2 g/dL (ref 3.5–5.2)
Alkaline Phosphatase: 77 U/L (ref 39–117)
Bilirubin, Direct: 0.1 mg/dL (ref 0.0–0.3)
Total Bilirubin: 0.4 mg/dL (ref 0.2–1.2)
Total Protein: 6.3 g/dL (ref 6.0–8.3)

## 2017-01-25 LAB — CBC WITH DIFFERENTIAL/PLATELET
Basophils Absolute: 0 10*3/uL (ref 0.0–0.1)
Basophils Relative: 1 % (ref 0.0–3.0)
Eosinophils Absolute: 0.1 10*3/uL (ref 0.0–0.7)
Eosinophils Relative: 3.6 % (ref 0.0–5.0)
HCT: 38 % (ref 36.0–46.0)
Hemoglobin: 13.2 g/dL (ref 12.0–15.0)
Lymphocytes Relative: 40.2 % (ref 12.0–46.0)
Lymphs Abs: 1.5 10*3/uL (ref 0.7–4.0)
MCHC: 34.7 g/dL (ref 30.0–36.0)
MCV: 97.2 fl (ref 78.0–100.0)
Monocytes Absolute: 0.3 10*3/uL (ref 0.1–1.0)
Monocytes Relative: 8.1 % (ref 3.0–12.0)
Neutro Abs: 1.7 10*3/uL (ref 1.4–7.7)
Neutrophils Relative %: 47.1 % (ref 43.0–77.0)
Platelets: 218 10*3/uL (ref 150.0–400.0)
RBC: 3.91 Mil/uL (ref 3.87–5.11)
RDW: 14.2 % (ref 11.5–15.5)
WBC: 3.7 10*3/uL — ABNORMAL LOW (ref 4.0–10.5)

## 2017-01-25 LAB — TSH: TSH: 1.58 u[IU]/mL (ref 0.35–4.50)

## 2017-02-01 ENCOUNTER — Encounter: Payer: Self-pay | Admitting: Internal Medicine

## 2017-02-01 ENCOUNTER — Ambulatory Visit (INDEPENDENT_AMBULATORY_CARE_PROVIDER_SITE_OTHER): Payer: Medicare Other | Admitting: Internal Medicine

## 2017-02-01 VITALS — BP 118/68 | HR 87 | Temp 97.5°F | Ht 65.0 in | Wt 241.6 lb

## 2017-02-01 DIAGNOSIS — Z79899 Other long term (current) drug therapy: Secondary | ICD-10-CM

## 2017-02-01 DIAGNOSIS — G4733 Obstructive sleep apnea (adult) (pediatric): Secondary | ICD-10-CM

## 2017-02-01 DIAGNOSIS — M150 Primary generalized (osteo)arthritis: Secondary | ICD-10-CM | POA: Diagnosis not present

## 2017-02-01 DIAGNOSIS — G8929 Other chronic pain: Secondary | ICD-10-CM | POA: Diagnosis not present

## 2017-02-01 DIAGNOSIS — R739 Hyperglycemia, unspecified: Secondary | ICD-10-CM

## 2017-02-01 DIAGNOSIS — E785 Hyperlipidemia, unspecified: Secondary | ICD-10-CM

## 2017-02-01 DIAGNOSIS — R7989 Other specified abnormal findings of blood chemistry: Secondary | ICD-10-CM

## 2017-02-01 DIAGNOSIS — M1990 Unspecified osteoarthritis, unspecified site: Secondary | ICD-10-CM

## 2017-02-01 DIAGNOSIS — M159 Polyosteoarthritis, unspecified: Secondary | ICD-10-CM

## 2017-02-01 DIAGNOSIS — Z0000 Encounter for general adult medical examination without abnormal findings: Secondary | ICD-10-CM

## 2017-02-01 DIAGNOSIS — R945 Abnormal results of liver function studies: Secondary | ICD-10-CM

## 2017-02-01 MED ORDER — PREGABALIN 50 MG PO CAPS: 50.0000 mg | ORAL_CAPSULE | Freq: Three times a day (TID) | ORAL | 2 refills | Status: DC

## 2017-02-01 NOTE — Patient Instructions (Addendum)
Trial of lyrica for the  Pain situation we discussed .   Check lfts and hep c screen in about 3 months and then rov .   utd on HCM   mammo dexa etc .  Pt aware of  parameteres .

## 2017-02-01 NOTE — Progress Notes (Signed)
Chief Complaint  Patient presents with  . Medicare Wellness    meds yearly check     HPI: Terri Rodriguez 71 y.o. comes in today for Preventive Medicare wellness visit . And Chronic disease management  DJDMost problematic hands are becoming more deformed back despite surgery still is problematic as well as neck. She has had bilateral hip replacements and knee surgeries. She uses Celebrex pain medicine that helps her to just get by. No falling but does have difficulty getting in and out of the car. Husband is worried about her being depressed she is on Cymbalta is not hopeless feels depressed but mostly from chronic pain hard to look forward to. However she very much enjoys her granddaughter. Likes to get a concert listen to music. She placed the p.m. now although one of her deformed fingers hits a double key.  OSA rx dry mouth   raynauds  [place]  CV: Since last visit. 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  She is not diabetic.  Health Maintenance  Topic Date Due  . Hepatitis C Screening  1946-09-10  . HEMOGLOBIN A1C  01/29/2017  . FOOT EXAM  02/01/2018 (Originally 01/30/1956)  . OPHTHALMOLOGY EXAM  10/05/2017  . MAMMOGRAM  07/07/2018  . TETANUS/TDAP  01/11/2024  . COLONOSCOPY  02/06/2024  . INFLUENZA VACCINE  Completed  . DEXA SCAN  Completed  . PNA vac Low Risk Adult  Completed   Health Maintenance Review LIFESTYLE:  TAD3 wine per night  Sugar beverages:n Sleep:osa  Interrupted   MEDICARE DOCUMENT QUESTIONS  TO SCAN   Hearing: ok?   Vision:  No limitations at present . Last eye check UTD  Safety:  Has smoke detector and wears seat belts.  No firearms. No excess sun exposure. Sees dentist regularly.  Falls: n mobility challenged by pain   Advance directive :  Reviewed  Has one.but no filled out on purpiose family knows her wishes   Memory: Felt to be good  , no  concern from her or her family.  Depression: No anhedonia unusual crying some  depressive symptoms see above  Nutrition: Eats well balanced diet; adequate calcium and vitamin D. No swallowing chewing problems.  Injury: no major injuries in the last six months.  Other healthcare providers:  Reviewed today . List on form   Social:  Lives with spouse married. No pets. Retired md   Preventive parameters: up-to-date  Reviewed   ADLS:   There are no problems or need for assistance  driving, feeding, obtaining food, dressing, toileting and bathing, managing money using phone. She is independent. But has djd mobility challenges      ROS:  See above  GEN/ HEENT: No fever, significant weight changes sweats headaches vision problems hearing changes, CV/ PULM; No chest pain shortness of breath cough, syncope,edema  change in exercise tolerance. GI /GU: No adominal pain, vomiting, change in bowel habits. No blood in the stool. No significant GU symptoms. SKIN/HEME: ,no acute skin rashes suspicious lesions or bleeding. No lymphadenopathy, nodules, masses.  NEURO/ PSYCH:  No newneurologic signs such as weakness numbness. anxiety. IMM/ Allergy: No unusual infections.  Allergy .   REST of 12 system review negative except as per HPI   Past Medical History:  Diagnosis Date  . ALLERGIC RHINITIS   . Anginal pain ([place])   . Cholelithiasis 06/09/08  . Complication of anesthesia 2012  went to ICU after bilateral knees ,? unstable vital signs  . Diverticulitis   . EPICONDYLITIS, LATERAL    osteoarthritis, previous joint replacements  . GERD   . Hepatic hemangioma 06/09/08  . Hiatal hernia 03/2000  . Hx of adenomatous polyp of colon 06/05/08  . Hx of cardiac catheterization 2008    clean coronarys DrKelly   . Hx of chest pain    neg cath remot hx of narrowwing lad in 2003 nl 2008, none in many years  . HYPERLIPIDEMIA   . IBS (irritable bowel syndrome)   . LEUKOPENIA, CHRONIC   . Mononucleosis 1963    . Nasal injury 06/14/2012  . RAYNAUD'S SYNDROME, HX OF    improved after cardiac meds initiated  . Rosacea    facial  . SLEEP APNEA, OBSTRUCTIVE    cpap, settings "automatic" settings 11-12  . Subacute thyroiditis   . UNSPECIFIED ANEMIA     Family History  Problem Relation Age of Onset  . Rheum arthritis Mother   . Diabetes Mother   . Heart failure Mother   . Thrombocytopenia Mother   . Sleep apnea Mother   . Ulcers Mother     PUD  . Deep vein thrombosis Father   . Pulmonary embolism Father   . Osteoarthritis Father   . Atrial fibrillation Father   . Dementia Father   . Sleep apnea Father   . Sleep apnea Brother   . Obesity Brother   . Sleep apnea Brother   . Obesity Brother   . Colon cancer Maternal Aunt 20  . Colon cancer Maternal Aunt 90  . Pancreatic cancer    . Uterine cancer    . Leukemia    . Inflammatory bowel disease      aunt  . Congenital adrenal hyperplasia Grandchild   . Atrial fibrillation Brother     Social History   Social History  . Marital status: Married    Spouse name: N/A  . Number of children: 1  . Years of education: N/A   Occupational History  . Physician    Social History Main Topics  . Smoking status: Never Smoker  . Smokeless tobacco: Never Used  . Alcohol use 8.4 oz/week    14 Glasses of wine per week     Comment: 1 glass nightly  . Drug use: No  . Sexual activity: Yes    Partners: Male    Birth control/ protection: Surgical     Comment: TAH/BSO   Other Topics Concern  . 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       Outpatient Encounter Prescriptions as of 02/01/2017  Medication Sig  . acetaminophen (TYLENOL) 500 MG tablet Take 1,000 mg by mouth every 6 (six) hours as needed for mild pain or headache.   . Alum Hydroxide-Mag Carbonate (GAVISCON PO) Take 1 tablet by mouth 4 (four) times daily as needed (heart  burn).   Marland Kitchen amLODipine (NORVASC) 5 MG tablet Take 1 tablet (5 mg total) by mouth daily.  Marland Kitchen amoxicillin (AMOXIL) 500 MG capsule 2,000 mg. 1 hour prior to dental procedures.  Marland Kitchen aspirin 81 MG tablet Take 81 mg by mouth daily.  Marland Kitchen atorvastatin (LIPITOR) 20 MG tablet Take 1 tablet (20 mg total) by mouth every evening.  . Azelaic Acid (FINACEA) 15 % cream Apply 1 application topically 2 (  two) times daily. After skin is thoroughly washed and patted dry, gently but thoroughly massage a thin film of azelaic acid cream into the affected area twice daily, in the morning and evening.  . carvedilol (COREG) 3.125 MG tablet TAKE 1 TABLET BY MOUTH TWICE DAILY WITH A MEAL  . celecoxib (CELEBREX) 200 MG capsule TAKE 1 CAPSULE BY MOUTH DAILY  . Cholecalciferol (VITAMIN D3) 2000 UNITS TABS Take 1 tablet by mouth daily.  . clindamycin (CLEOCIN T) 1 % external solution 2 (two) times daily as needed.  . DULoxetine (CYMBALTA) 60 MG capsule TAKE 1 CAPSULE BY MOUTH EVERY EVENING  . estradiol (ESTRACE) 0.5 MG tablet Take 0.5 tablets (0.25 mg total) by mouth daily.  . fexofenadine (ALLEGRA) 180 MG tablet Take 180 mg by mouth daily. Takes in PM  . metroNIDAZOLE (METROGEL) 1 % gel Apply 1 application topically daily. Apply to face  . Multiple Vitamins-Minerals (CENTRUM ADULTS PO) Take 1 tablet by mouth daily.  . nitroGLYCERIN (NITROSTAT) 0.4 MG SL tablet PLACE 1 TABLET UNDER THE TONGUE EVERY 5 MINUTES AS NEEDED FOR CHEST PAIN  . omeprazole-sodium bicarbonate (ZEGERID) 40-1100 MG per capsule Take 1 capsule by mouth daily before breakfast.   . ramipril (ALTACE) 10 MG capsule TAKE 1 CAPSULE BY MOUTH EVERY EVENING  . valACYclovir (VALTREX) 1000 MG tablet Take 1 tablet (1,000 mg total) by mouth 2 (two) times daily as needed. For outbreaks  . pregabalin (LYRICA) 50 MG capsule Take 1 capsule (50 mg total) by mouth 3 (three) times daily.   No facility-administered encounter medications on file as of 02/01/2017.     EXAM:  BP  118/68 (BP Location: Right Arm, Patient Position: Sitting, Cuff Size: Normal)   Pulse 87   Temp 97.5 F (36.4 C) (Oral)   Ht 5\' 5"  (1.651 m)   Wt 241 lb 9.6 oz (109.6 kg)   BMI 40.20 kg/m   Body mass index is 40.2 kg/m.  Physical Exam: Vital signs reviewed [id number] is a well-developed well-nourished alert cooperative   who appears stated age in no acute distress.  HEENT: normocephalic atraumatic , Eyes: PERRL EOM's full, conjunctiva clear, Nares: paten,t no deformity discharge or tenderness., Ears: no deformity EAC's clear TMs with normal landmarks. Mouth: clear OP, no lesions, edema.  Moist mucous membranes. Dentition in adequate repair. NECK: supple without masses, thyromegaly or bruits. CHEST/PULM:  Clear to auscultation and percussion breath sounds equal no wheeze , rales or rhonchi. No chest wall deformities or tenderness. CV: PMI is nondisplaced, S1 S2 no gallops, murmurs, rubs. Peripheral pulses are full without delay.No JVD . Breast: normal by inspection . No dimpling, discharge, masses, tenderness or discharge . ABDOMEN: Bowel sounds normal nontender  No guard or rebound, no hepato splenomegal no CVA tenderness.   Extremtities:  No clubbing cyanosis or edema,  or redness no focal atrophySignificant deformity of the IP hypertensive nodes on both hands left more than right. Healed replacement joint scars. NEURO:  Oriented x3, cranial nerves 3-12 appear to be intact, no obvious focal weakness,gait within normal limits no abnormal reflexes or asymmetrical SKIN: No acute rashes normal turgor, color, no bruising or petechiae. PSYCH: Oriented, good eye contact, no obvious  anxiety, cognition and judgment appear normal. Speech is fast and witty. No cognitive impairment. LN: no cervical axillaryadenopathy No noted deficits in memory, attention, and speech.   Lab Results  Component Value Date   WBC 3.7 (L) 01/25/2017   HGB 13.2 01/25/2017   HCT 38.0 01/25/2017   PLT  218.0 01/25/2017     GLUCOSE 93 01/25/2017   CHOL 165 01/25/2017   TRIG 80.0 01/25/2017   HDL 76.10 01/25/2017   LDLCALC 73 01/25/2017   ALT 30 01/25/2017   AST 42 (H) 01/25/2017   NA 140 01/25/2017   K 4.2 01/25/2017   CL 106 01/25/2017   CREATININE 0.60 01/25/2017   BUN 20 01/25/2017   CO2 27 01/25/2017   TSH 1.58 01/25/2017   INR 0.97 07/10/2014   HGBA1C 5.3 07/29/2016   Blood work results reviewed with patient. ASSESSMENT AND PLAN:  Discussed the following assessment and plan:  Medicare annual wellness visit, subsequent - There are no quality care gaps/ she is up-to-date.  Medication management  Primary osteoarthritis involving multiple joints  Arthritis  Other chronic pain  Hyperglycemia  Hyperlipidemia, unspecified hyperlipidemia type  Obstructive sleep apnea  Abnormal LFTs I believe that her depressive symptoms which are not alarming related to her chronic pain and medical conditions. We discussed other options to remain on the Cymbalta and try adding Lyrica because of the different pain type she has might respond as a neurologic pain. Particularly back neck. She relates have her mom and family man birth had terrible arthritis and difficulties in their  Older years .  Would even consider consult from physical medicine rehabilitation in regard to management of her painful condition. We'll begin a trial of Lyrica 50 mg twice a day increase to 3 times a day precautions discussed don't drink with alcohol at onset may do better at higher doses she can send a message on my chart for titration. Plan repeat LFTs and hepatitis C screen in about 3 months that are OV or as needed. Patient Care Team: Burnis Medin, MD as PCP - General Troy Sine, MD (Cardiology) Jovita Gamma, MD (Neurosurgery) Megan Salon, MD as Attending Physician (Obstetrics and Gynecology) Gaynelle Arabian, MD as Attending Physician (Orthopedic Surgery) Deneise Lever, MD (Pulmonary Disease) Bo Merino,  MD as Consulting Physician (Rheumatology) Jari Pigg, MD as Consulting Physician (Dermatology) Calvert Cantor, MD as Consulting Physician (Ophthalmology) Iran Planas, MD as Consulting Physician (Orthopedic Surgery)  Patient Instructions  Trial of lyrica for the  Pain situation we discussed .   Check lfts and hep c screen in about 3 months and then rov .   utd on HCM   mammo dexa etc .  Pt aware of  parameteres .         Standley Brooking. Lyrick Worland M.D.

## 2017-02-14 ENCOUNTER — Ambulatory Visit: Payer: Medicare Other | Admitting: Cardiovascular Disease

## 2017-02-22 ENCOUNTER — Encounter: Payer: Medicare Other | Admitting: Cardiovascular Disease

## 2017-02-24 ENCOUNTER — Other Ambulatory Visit: Payer: Self-pay | Admitting: Cardiovascular Disease

## 2017-02-27 ENCOUNTER — Other Ambulatory Visit: Payer: Self-pay | Admitting: Cardiovascular Disease

## 2017-02-27 NOTE — Telephone Encounter (Signed)
Rx(s) sent to pharmacy electronically.  

## 2017-03-10 ENCOUNTER — Ambulatory Visit (INDEPENDENT_AMBULATORY_CARE_PROVIDER_SITE_OTHER): Payer: Medicare Other | Admitting: Cardiovascular Disease

## 2017-03-10 ENCOUNTER — Encounter: Payer: Self-pay | Admitting: Cardiovascular Disease

## 2017-03-10 VITALS — BP 112/64 | HR 65 | Ht 65.0 in | Wt 245.0 lb

## 2017-03-10 DIAGNOSIS — I7300 Raynaud's syndrome without gangrene: Secondary | ICD-10-CM

## 2017-03-10 DIAGNOSIS — I10 Essential (primary) hypertension: Secondary | ICD-10-CM

## 2017-03-10 DIAGNOSIS — E785 Hyperlipidemia, unspecified: Secondary | ICD-10-CM

## 2017-03-10 DIAGNOSIS — Z5181 Encounter for therapeutic drug level monitoring: Secondary | ICD-10-CM | POA: Diagnosis not present

## 2017-03-10 DIAGNOSIS — I2510 Atherosclerotic heart disease of native coronary artery without angina pectoris: Secondary | ICD-10-CM

## 2017-03-10 MED ORDER — AMLODIPINE BESYLATE 5 MG PO TABS: 5.0000 mg | ORAL_TABLET | Freq: Every day | ORAL | 3 refills | Status: DC

## 2017-03-10 MED ORDER — CARVEDILOL 3.125 MG PO TABS: 3.1250 mg | ORAL_TABLET | Freq: Two times a day (BID) | ORAL | 3 refills | Status: DC

## 2017-03-10 MED ORDER — NITROGLYCERIN 0.4 MG SL SUBL: SUBLINGUAL_TABLET | SUBLINGUAL | 3 refills | Status: DC

## 2017-03-10 MED ORDER — RAMIPRIL 10 MG PO CAPS: 10.0000 mg | ORAL_CAPSULE | Freq: Every evening | ORAL | 3 refills | Status: DC

## 2017-03-10 MED ORDER — ATORVASTATIN CALCIUM 20 MG PO TABS: 20.0000 mg | ORAL_TABLET | Freq: Every evening | ORAL | 3 refills | Status: DC

## 2017-03-10 NOTE — Patient Instructions (Signed)
Your physician wants you to follow-up in: 1 year or sooner if needed. You will receive a reminder letter in the mail two months in advance. If you don't receive a letter, please call our office to schedule the follow-up appointment.   If you need a refill on your cardiac medications before your next appointment, please call your pharmacy.   

## 2017-03-10 NOTE — Progress Notes (Signed)
Patient ID: Terri Rodriguez, female   DOB: 03/16/1946, 71 y.o.   MRN: [id number]    Primary M.D.: Dr. Shanon Ace  HPI: Terri Rodriguez is a 71 y.o. female who presents to the office today for one-year cardiology evaluation.  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 Cardiac catheterization demonstrated a 30% tubular narrowing of the proximal LAD. She has a history of probable Raynaud's with a remote history of digital ischemia/spasm. She has been aggressively treated with medical therapy both for potential coronary vasospasm as well as lipid-lowering therapy for treatment methods to optimize endothelial function and blood pressure. She does have significant arthritic issues with degenerative disc disease and has undergone surgery involving L3-L4, L4-L5 by Dr. Sherwood Gambler. She also is status post bilateral knee surgery as well as left hip replacement. She has a history of obstructive sleep apnea on CPAP therapy, history of osteoarthritis which has been progressive. There also is a history of rosacea.    She sustained a left radius fracture and required open reduction and internal fixation in April 2015.  In August 2015, she underwent right hip replacement.  Her arthritis has limited her activity.  She has a history of obstructive sleep apnea and admits to 100% CPAP use.  She is unaware of breakthrough snoring.  She denies excessive daytime sleepiness.  Over the past year she denies recurrent episodes of chest tightness or pressure. She denies any Raynaud's phenomenon.  She states that she can now finally begin to exercise again after 2 years.  She is unaware of any palpitations.  She continues to have neuropathy in nerve pain issues for which she is on Cymbalta and recently was started on pregabalin with improvement.  There is no shortness of breath, chest pain, or digital vasospasm on amlodipine 5 mg, ramipril 10 mg, and carvedilol 3.125 mg twice a  day.  She is on atorvastatin 20 mg.  Recent lipid studies from her 2118 showed a total cholesterol 165, HDL 76, LDLs 73, which had risen from 59 one year previously, and triglycerides are 108.  She presents for evaluation.  Past Medical History:  Diagnosis Date  . ALLERGIC RHINITIS   . Anginal pain ([place])   . Cholelithiasis 06/09/08  . Complication of anesthesia 2012   went to ICU after bilateral knees ,? unstable vital signs  . Diverticulitis   . EPICONDYLITIS, LATERAL    osteoarthritis, previous joint replacements  . GERD   . Hepatic hemangioma 06/09/08  . Hiatal hernia 03/2000  . Hx of adenomatous polyp of colon 06/05/08  . Hx of cardiac catheterization 2008    clean coronarys DrKelly   . Hx of chest pain    neg cath remot hx of narrowwing lad in 2003 nl 2008, none in many years  . HYPERLIPIDEMIA   . IBS (irritable bowel syndrome)   . LEUKOPENIA, CHRONIC   . Mononucleosis 1963  . Nasal injury 06/14/2012  . RAYNAUD'S SYNDROME, HX OF    improved after cardiac meds initiated  . Rosacea    facial  . SLEEP APNEA, OBSTRUCTIVE    cpap, settings "automatic" settings 11-12  . Subacute thyroiditis   . UNSPECIFIED ANEMIA     Past Surgical History:  Procedure Laterality Date  . ABDOMINAL HYSTERECTOMY    . BACK SURGERY  02/06/2008  . CARDIAC CATHETERIZATION  04/07/2007   Noncritical coronary artery disease. Contiue medical therapy.  Marland Kitchen CARDIAC CATHETERIZATION  12/03/2007   Normal LV  function. Mild angiographic mitral valve prolapse. Normal coronary arteries.  Marland Kitchen CARDIOVASCULAR STRESS TEST  11/09/2007   Moderate ischemia in Mid Anterior, Mid Anteroseptal, Apical Anterior, and Apical Septal regions. EKG negative for ischemia.  Marland Kitchen [place]  . dental implants  11/2002,11/2011,11/2012  . DILATION AND CURETTAGE OF UTERUS    . JOINT REPLACEMENT  09/21/2011   bilateral knee  . KNEE ARTHROSCOPY     bilateral  . lumbar surgery fixation with disc replacement  10/08   Left  .  NASAL SEPTOPLASTY W/ TURBINOPLASTY    . NOCTURNAL POLYSOMNOGRAM  12/31/2006   Severe obstructive sleep apnea. AHI-87/hr  . REPLACEMENT TOTAL KNEE     bilateral  . TONSILLECTOMY    . TONSILLECTOMY  1952  . TOTAL HIP ARTHROPLASTY Left 10/21/2013   Procedure: LEFT TOTAL HIP ARTHROPLASTY ANTERIOR APPROACH;  Surgeon: [name], MD;  Location: [facility];  Service: Orthopedics;  Laterality: Left;  . TOTAL HIP ARTHROPLASTY Right 07/16/2014   Procedure: RIGHT TOTAL HIP ARTHROPLASTY ANTERIOR APPROACH;  Surgeon: [name], MD;  Location: [facility];  Service: Orthopedics;  Laterality: Right;  . TRANSTHORACIC ECHOCARDIOGRAM  11/09/2007   EF 56%, LV systolic function normal. Mild aortic root dilation.  . WRIST SURGERY Left 04/03/14   ORIF "distal radial head fracture"    Allergies  Allergen Reactions  . Codeine     nausea  . Crab [Shellfish Allergy] Itching and Nausea And Vomiting    Current Outpatient Prescriptions  Medication Sig Dispense Refill  . acetaminophen (TYLENOL) 500 MG tablet Take 1,000 mg by mouth every 6 (six) hours as needed for mild pain or headache.     . Alum Hydroxide-Mag Carbonate (GAVISCON PO) Take 1 tablet by mouth 4 (four) times daily as needed (heart burn).     Marland Kitchen amLODipine (NORVASC) 5 MG tablet Take 1 tablet (5 mg total) by mouth daily. 90 tablet 3  . amoxicillin (AMOXIL) 500 MG capsule 2,000 mg. 1 hour prior to dental procedures.    Marland Kitchen aspirin 81 MG tablet Take 81 mg by mouth daily.    Marland Kitchen atorvastatin (LIPITOR) 20 MG tablet Take 1 tablet (20 mg total) by mouth every evening. 90 tablet 3  . Azelaic Acid (FINACEA) 15 % cream Apply 1 application topically 2 (two) times daily. After skin is thoroughly washed and patted dry, gently but thoroughly massage a thin film of azelaic acid cream into the affected area twice daily, in the morning and evening.    . carvedilol (COREG) 3.125 MG tablet Take 1 tablet (3.125 mg total) by mouth 2 (two) times daily with a meal. 180 tablet 3    . celecoxib (CELEBREX) 200 MG capsule TAKE 1 CAPSULE BY MOUTH DAILY 90 capsule 1  . Cholecalciferol (VITAMIN D3) 2000 UNITS TABS Take 1 tablet by mouth daily.    . clindamycin (CLEOCIN T) 1 % external solution 2 (two) times daily as needed.  3  . DULoxetine (CYMBALTA) 60 MG capsule TAKE 1 CAPSULE BY MOUTH EVERY EVENING 90 capsule 0  . estradiol (ESTRACE) 0.5 MG tablet Take 0.5 tablets (0.25 mg total) by mouth daily. 45 tablet 4  . fexofenadine (ALLEGRA) 180 MG tablet Take 180 mg by mouth daily. Takes in PM    . metroNIDAZOLE (METROGEL) 1 % gel Apply 1 application topically daily. Apply to face    . Multiple Vitamins-Minerals (CENTRUM ADULTS PO) Take 1 tablet by mouth daily.    . nitroGLYCERIN (NITROSTAT) 0.4 MG SL tablet PLACE  1 TABLET UNDER THE TONGUE EVERY 5 MINUTES AS NEEDED FOR CHEST PAIN 25 tablet 3  . omeprazole-sodium bicarbonate (ZEGERID) 40-1100 MG per capsule Take 1 capsule by mouth daily before breakfast.     . pregabalin (LYRICA) 50 MG capsule Take 1 capsule (50 mg total) by mouth 3 (three) times daily. 90 capsule 2  . ramipril (ALTACE) 10 MG capsule Take 1 capsule (10 mg total) by mouth every evening. 90 capsule 3  . valACYclovir (VALTREX) 1000 MG tablet Take 1 tablet (1,000 mg total) by mouth 2 (two) times daily as needed. For outbreaks 180 tablet 1   No current facility-administered medications for this visit.     Social History   Social History  . Marital status: Married    Spouse name: N/A  . Number of children: 1  . Years of education: N/A   Occupational History  . Physician    Social History Main Topics  . Smoking status: Never Smoker  . Smokeless tobacco: Never Used  . Alcohol use 8.4 oz/week    14 Glasses of wine per week     Comment: 1 glass nightly  . Drug use: No  . Sexual activity: Yes    Partners: Male    Birth control/ protection: Surgical     Comment: TAH/BSO   Other Topics Concern  . 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       Family History  Problem Relation Age of Onset  . Rheum arthritis Mother   . Diabetes Mother   . Heart failure Mother   . Thrombocytopenia Mother   . Sleep apnea Mother   . Ulcers Mother     PUD  . Deep vein thrombosis Father   . Pulmonary embolism Father   . Osteoarthritis Father   . Atrial fibrillation Father   . Dementia Father   . Sleep apnea Father   . Sleep apnea Brother   . Obesity Brother   . Sleep apnea Brother   . Obesity Brother   . Colon cancer Maternal Aunt 35  . Colon cancer Maternal Aunt 90  . Pancreatic cancer    . Uterine cancer    . Leukemia    . Inflammatory bowel disease      aunt  . Congenital adrenal hyperplasia Grandchild   . Atrial fibrillation Brother     ROS General: Negative; No fevers, chills, or night sweats; positive for weight gain  HEENT: Negative; No changes in vision or hearing, sinus congestion, difficulty swallowing Pulmonary: Negative; No cough, wheezing, shortness of breath, hemoptysis Cardiovascular: Negative; No chest pain, presyncope, syncope, palpitations GI: Positive for GERD No nausea, vomiting, diarrhea, or abdominal pain GU: Negative; No dysuria, hematuria, or difficulty voiding Musculoskeletal: Positive for lumbar disc surgery, bilateral knee replacements and bilateral hip replacements Hematologic/Oncology: Negative; no easy bruising, bleeding Endocrine: Negative; no heat/cold intolerance; no diabetes Neuro: Negative; no changes in balance, headaches Skin: Negative; No rashes or skin lesions Psychiatric: Negative; No behavioral problems, depression Sleep: Positive for obstructive sleep apnea on CPAP therapy; No snoring, daytime sleepiness, hypersomnolence, bruxism, restless legs, hypnogognic hallucinations, no cataplexy Other comprehensive 14 point system review is negative.   PE BP 112/64   Pulse 65   Ht 5' 5"  (1.651 m)   Wt 245 lb (111.1 kg)   BMI 40.77 kg/m  Wt Readings from Last 3 Encounters:  03/10/17 245 lb (111.1 kg)  02/01/17 241 lb 9.6 oz (109.6 kg)  07/29/16 243 lb 12.8 oz (110.6 kg)   General: Alert, oriented, no distress. Moderate obesity Skin: normal turgor, no rashes HEENT: Normocephalic, atraumatic. Pupils round and reactive; sclera anicteric;no lid lag. Extraocular muscles intact. No xanthelasmas Nose without nasal septal hypertrophy Mouth/Parynx benign; Mallinpatti scale 3 Neck: No JVD, no carotid bruits; normal carotid upstroke Lungs: clear to ausculatation and percussion; no wheezing or rales Chest wall: no tenderness to palpitation Heart: RRR, s1 s2 normal; 6-5/0 systolic murmur along the left sternal border and apex;  no S3 or S4 gallop. No rub. No heaves. Abdomen: soft, nontender; no hepatosplenomehaly, BS+; abdominal aorta nontender and not dilated by palpation. Back: no CVA tenderness Pulses 2+ Extremities: no clubbing cyanosis or edema, Homan's sign negative  Neurologic: grossly nonfocal; cranial nerves grossly normal. Psychologic: normal affect and mood.  ECG (independently read by me): Normal sinus rhythm at 65 bpm.  Nonspecific T waves.  Normal intervals.  March 2017 ECG (independently read by me): Normal sinus rhythm at 81 bpm isolate a PVC.  Nonspecific T-wave changes with previously noted T-wave inversion in V1, V2.  QTc interval 487 ms.  February 2016 ECG (independently read by me): Normal sinus rhythm at 81 bpm.  Previously noted T-wave inversion V1 and V2 which is old and has been documented for over 10 years.  Mild QTC increased at 469 ms  February 2015 ECG (independently read by me): Sinus rhythm at 76 beats per minute period. She noted T wave inversion in V1 V2 which is old.  LABS: BMP Latest Ref Rng & Units 01/25/2017 07/20/2016 01/22/2016  Glucose 70 - 99 mg/dL 93 126(H) 108(H)  BUN 6 - 23 mg/dL _0 Creatinine 0.40 - 1.20 mg/dL 0.60 0.70 0.68   Sodium 135 - 145 mEq/L 140 138 141  Potassium 3.5 - 5.1 mEq/L 4.2 4.5 4.9  Chloride 96 - 112 mEq/L 106 103 104  CO2 19 - 32 mEq/L _1 Calcium 8.4 - 10.5 mg/dL 9.0 9.2 9.3   Hepatic Function Latest Ref Rng & Units 01/25/2017 01/22/2016 01/15/2015  Total Protein 6.0 - 8.3 g/dL 6.3 6.8 6.7  Albumin 3.5 - 5.2 g/dL 4.2 4.3 4.1  AST 0 - 37 U/L 42(H) 34 34  ALT 0 - 35 U/L _2 Alk Phosphatase 39 - 117 U/L 77 85 99  Total Bilirubin 0.2 - 1.2 mg/dL 0.4 0.4 0.4  Bilirubin, Direct 0.0 - 0.3 mg/dL 0.1 0.1 -    CBC Latest Ref Rng & Units 01/25/2017 01/22/2016 07/15/2015  WBC 4.0 - 10.5 K/uL 3.7(L) 4.0 3.6(L)  Hemoglobin 12.0 - 15.0 g/dL 13.2 12.8 12.3  Hematocrit 36.0 - 46.0 % 38.0 37.4 36.9  Platelets 150.0 - 400.0 K/uL 218.0 211.0 225.0    Lab Results  Component Value Date   MCV 97.2 01/25/2017   MCV 94.2 01/22/2016   MCV 92.8 07/15/2015    Lab Results  Component Value Date   TSH 1.58 01/25/2017   Lab Results  Component Value Date   HGBA1C 5.3 07/29/2016   Lipid Panel     Component Value Date/Time   CHOL 165 01/25/2017 1025   TRIG 80.0 01/25/2017 1025   HDL 76.10 01/25/2017 1025   CHOLHDL 2 01/25/2017 1025   VLDL 16.0 01/25/2017 1025   LDLCALC 73 01/25/2017 1025     RADIOLOGY: No results found.  IMPRESSION:  1. Mild CAD   2. Hyperlipidemia LDL goal <70   3. Medication monitoring encounter   4. Raynaud's phenomenon without gangrene   5. 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 She underwent repeat cardiac catheterization in 2008 prior to back surgery after nuclear perfusion study raise the possibility of ischemia.  Coronary angiography showed normalization of coronary arteries without significant obstruction. She is doing well and denies any recent episodes of digital ischemia or Raynaud's symptomatology. She has  osteoarthritic changes of her hands which have become progressive. Her pressure today is well controlled on amlodipine 5 mg, ramipril 10 mg, in addition to her carvedilol 3.125 mg twice a day.  She is tolerating low-dose carvedilol and he denies any episodes of Raynaud's phenomenon.  When  I saw her last year, I felt a cardiac murmur had slightly progressed.  She underwent a repeat echo Doppler study which showed normal ejection fraction at 55-60%.  There were no wall motion abnormalities.  She had normal diastolic parameters.  There was trivial aortic insufficiency.  She has not been successful with weight loss and is now in the morbidly obese range at 40.77.  Exercise and weight loss was strongly encouraged.  She continues to be on atorvastatin 20 mg for hyperlipidemia.  Her LDL cholesterol has risen from 59-73.  I discussed with her recent data suggesting getting LDL even lower and reviewed some of the PCSK9 data showing improved cardiac outcomes with very low LDL levels.  Her neuropathy is improved with pregabalin as well as Cymbalta.  As long she remains stable, I will see her in one year for reevaluation. Time spent: 25 minutes  Troy Sine, MD, [facility]  03/10/2017 10:46 AM

## 2017-03-12 NOTE — Progress Notes (Signed)
This encounter was created in error - please disregard.  This encounter was created in error - please disregard.

## 2017-03-28 ENCOUNTER — Other Ambulatory Visit: Payer: Self-pay | Admitting: Internal Medicine

## 2017-04-09 ENCOUNTER — Other Ambulatory Visit: Payer: Self-pay | Admitting: Internal Medicine

## 2017-04-21 ENCOUNTER — Telehealth: Payer: Self-pay | Admitting: Internal Medicine

## 2017-04-21 NOTE — Telephone Encounter (Signed)
[id number] x1 

## 2017-04-24 NOTE — Telephone Encounter (Signed)
ATC pt x1 phone message states pt's phone is not accepting calls right now

## 2017-04-25 ENCOUNTER — Encounter: Payer: Self-pay | Admitting: Internal Medicine

## 2017-04-25 NOTE — Telephone Encounter (Signed)
Spoke with Velna Hatchet at [employer] notes show they need face to face notes on patient-this is due to Traditional Medicare as patients primary insurance. Pt will need appt prior to her yearly follow up 06-30-17.    Pt will be here on Wednesday 04/26/17 at 10:30 am for 10:45 am with TP. Will need to placed order to [facility] for CPAP supplies and send OV notes to DME after visit.    Nothing more needed at this time.

## 2017-04-26 ENCOUNTER — Ambulatory Visit (INDEPENDENT_AMBULATORY_CARE_PROVIDER_SITE_OTHER): Payer: Medicare Other | Admitting: Adult Health

## 2017-04-26 ENCOUNTER — Encounter: Payer: Self-pay | Admitting: Adult Health

## 2017-04-26 ENCOUNTER — Other Ambulatory Visit (INDEPENDENT_AMBULATORY_CARE_PROVIDER_SITE_OTHER): Payer: Medicare Other

## 2017-04-26 DIAGNOSIS — Z0000 Encounter for general adult medical examination without abnormal findings: Secondary | ICD-10-CM | POA: Diagnosis not present

## 2017-04-26 DIAGNOSIS — I2510 Atherosclerotic heart disease of native coronary artery without angina pectoris: Secondary | ICD-10-CM

## 2017-04-26 DIAGNOSIS — G4733 Obstructive sleep apnea (adult) (pediatric): Secondary | ICD-10-CM | POA: Diagnosis not present

## 2017-04-26 DIAGNOSIS — E785 Hyperlipidemia, unspecified: Secondary | ICD-10-CM

## 2017-04-26 DIAGNOSIS — Z1159 Encounter for screening for other viral diseases: Secondary | ICD-10-CM | POA: Diagnosis not present

## 2017-04-26 LAB — HEPATIC FUNCTION PANEL
ALT: 33 U/L (ref 0–35)
AST: 42 U/L — ABNORMAL HIGH (ref 0–37)
Albumin: 4.4 g/dL (ref 3.5–5.2)
Alkaline Phosphatase: 75 U/L (ref 39–117)
Bilirubin, Direct: 0.1 mg/dL (ref 0.0–0.3)
Total Bilirubin: 0.5 mg/dL (ref 0.2–1.2)
Total Protein: 6.4 g/dL (ref 6.0–8.3)

## 2017-04-26 NOTE — Patient Instructions (Signed)
Continue on C Pap at bedtime. Keep up the good work. Work on weight loss Do not drive a sleepy Follow with Dr. Annamaria Boots in 1 year and as needed

## 2017-04-26 NOTE — Progress Notes (Signed)
@Patient  ID: Terri Rodriguez, female    DOB: 1946-10-11, 71 y.o.   MRN: [id number]  Chief Complaint  Patient presents with  . 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 Patient says she is doing very well on her C Pap. She feels rested with no significant daytime sleepiness. Patient says she wears every night without any difficulty. She uses about 8-9 hours each day. Download shows excellent compliance with average usage at 9 hours. She is on AutoSet 4-18 cm H2O. AHI 5, minimum leaks.   Allergies  Allergen Reactions  . Codeine     nausea  . Crab [Shellfish Allergy] Itching and Nausea And Vomiting    Immunization History  Administered Date(s) Administered  . Influenza Split 09/05/2011, 09/19/2012  . Influenza Whole 09/09/2009, 10/12/2010  . Influenza, High Dose Seasonal PF 09/08/2014, 10/01/2015  . Influenza,inj,Quad PF,36+ Mos 09/06/2013, 07/29/2016  . Pneumococcal Conjugate-13 01/10/2014  . Pneumococcal Polysaccharide-23 04/26/2010  . Td 12/05/2001, 01/10/2014  . Zoster 09/19/2012    Past Medical History:  Diagnosis Date  . ALLERGIC RHINITIS   . Anginal pain ([place])   . Cholelithiasis 06/09/08  . Complication of anesthesia 2012   went to ICU after bilateral knees ,? unstable vital signs  . Diverticulitis   . EPICONDYLITIS, LATERAL    osteoarthritis, previous joint replacements  . GERD   . Hepatic hemangioma 06/09/08  . Hiatal hernia 03/2000  . Hx of adenomatous polyp of colon 06/05/08  . Hx of cardiac catheterization 2008    clean coronarys DrKelly   . Hx of chest pain    neg cath remot hx of narrowwing lad in 2003 nl 2008, none in many years  . HYPERLIPIDEMIA   . IBS (irritable bowel syndrome)   . LEUKOPENIA, CHRONIC   . Mononucleosis 1963  . Nasal injury 06/14/2012  .  RAYNAUD'S SYNDROME, HX OF    improved after cardiac meds initiated  . Rosacea    facial  . SLEEP APNEA, OBSTRUCTIVE    cpap, settings "automatic" settings 11-12  . Subacute thyroiditis   . UNSPECIFIED ANEMIA     Tobacco History: History  Smoking Status  . Never Smoker  Smokeless Tobacco  . Never Used   Counseling given: Not Answered   Outpatient Encounter Prescriptions as of 04/26/2017  Medication Sig  . acetaminophen (TYLENOL) 500 MG tablet Take 1,000 mg by mouth every 6 (six) hours as needed for mild pain or headache.   . Alum Hydroxide-Mag Carbonate (GAVISCON PO) Take 1 tablet by mouth 4 (four) times daily as needed (heart burn).   Marland Kitchen amLODipine (NORVASC) 5 MG tablet Take 1 tablet (5 mg total) by mouth daily.  Marland Kitchen amoxicillin (AMOXIL) 500 MG capsule 2,000 mg. 1 hour prior to dental procedures.  Marland Kitchen aspirin 81 MG tablet Take 81 mg by mouth daily.  Marland Kitchen atorvastatin (LIPITOR) 20 MG tablet Take 1 tablet (20 mg total) by mouth every evening.  . Azelaic Acid (FINACEA) 15 % cream Apply 1 application topically 2 (two) times daily. After skin is thoroughly washed and patted dry, gently but thoroughly massage a thin film of azelaic acid cream into the affected area twice daily, in the morning and evening.  . carvedilol (COREG) 3.125 MG tablet Take 1 tablet (3.125 mg total) by mouth 2 (two) times  daily with a meal.  . celecoxib (CELEBREX) 200 MG capsule TAKE 1 CAPSULE BY MOUTH DAILY  . Cholecalciferol (VITAMIN D3) 2000 UNITS TABS Take 1 tablet by mouth daily.  . clindamycin (CLEOCIN T) 1 % external solution 2 (two) times daily as needed.  . DULoxetine (CYMBALTA) 60 MG capsule TAKE 1 CAPSULE BY MOUTH EVERY EVENING  . estradiol (ESTRACE) 0.5 MG tablet Take 0.5 tablets (0.25 mg total) by mouth daily.  . fexofenadine (ALLEGRA) 180 MG tablet Take 180 mg by mouth daily. Takes in PM  . metroNIDAZOLE (METROGEL) 1 % gel Apply 1 application topically daily. Apply to face  . Multiple Vitamins-Minerals  (CENTRUM ADULTS PO) Take 1 tablet by mouth daily.  . nitroGLYCERIN (NITROSTAT) 0.4 MG SL tablet PLACE 1 TABLET UNDER THE TONGUE EVERY 5 MINUTES AS NEEDED FOR CHEST PAIN  . omeprazole-sodium bicarbonate (ZEGERID) 40-1100 MG per capsule Take 1 capsule by mouth daily before breakfast.   . pregabalin (LYRICA) 50 MG capsule Take 1 capsule (50 mg total) by mouth 3 (three) times daily.  . ramipril (ALTACE) 10 MG capsule Take 1 capsule (10 mg total) by mouth every evening.  . valACYclovir (VALTREX) 1000 MG tablet Take 1 tablet (1,000 mg total) by mouth 2 (two) times daily as needed. For outbreaks   No facility-administered encounter medications on file as of 04/26/2017.      Review of Systems  Constitutional:   No  weight loss, night sweats,  Fevers, chills, fatigue, or  lassitude.  HEENT:   No headaches,  Difficulty swallowing,  Tooth/dental problems, or  Sore throat,                No sneezing, itching, ear ache, nasal congestion, post nasal drip,   CV:  No chest pain,  Orthopnea, PND, swelling in lower extremities, anasarca, dizziness, palpitations, syncope.   GI  No heartburn, indigestion, abdominal pain, nausea, vomiting, diarrhea, change in bowel habits, loss of appetite, bloody stools.   Resp: No shortness of breath with exertion or at rest.  No excess mucus, no productive cough,  No non-productive cough,  No coughing up of blood.  No change in color of mucus.  No wheezing.  No chest wall deformity  Skin: no rash or lesions.  GU: no dysuria, change in color of urine, no urgency or frequency.  No flank pain, no hematuria   MS:  No joint pain or swelling.  No decreased range of motion.  No back pain.    Physical Exam  BP 106/60 (BP Location: Left Arm, Cuff Size: Normal)   Pulse 80   Ht 5\' 6"  (1.676 m)   Wt 241 lb (109.3 kg)   SpO2 97%   BMI 38.90 kg/m   GEN: A/Ox3; pleasant , NAD, obesity    HEENT:  [place]/AT,  EACs-clear, TMs-wnl, NOSE-clear, THROAT-clear, no lesions, no  postnasal drip or exudate noted. Class 2-3 MP airway   NECK:  Supple w/ fair ROM; no JVD; normal carotid impulses w/o bruits; no thyromegaly or nodules palpated; no lymphadenopathy.    RESP  Clear  P & A; w/o, wheezes/ rales/ or rhonchi. no accessory muscle use, no dullness to percussion  CARD:  RRR, no m/r/g, no peripheral edema, pulses intact, no cyanosis or clubbing.  GI:   Soft & nt; nml bowel sounds; no organomegaly or masses detected.   Musco: Warm bil, no deformities or joint swelling noted.   Neuro: alert, no focal deficits noted.    Skin: Warm, no lesions or  rashes    Lab Results:  CBC   BNP No results found for: BNP  ProBNP No results found for: PROBNP  Imaging: No results found.   Assessment & Plan:   Obstructive sleep apnea Severe sleep apnea, well controlled on C Pap  Plan  Patient Instructions  Continue on C Pap at bedtime. Keep up the good work. Work on weight loss Do not drive a sleepy Follow with Dr. Annamaria Boots in 1 year and as needed    Morbid obesity Wt loss      Rexene Edison, NP 04/26/2017

## 2017-04-26 NOTE — Assessment & Plan Note (Signed)
Severe sleep apnea, well controlled on C Pap  Plan  Patient Instructions  Continue on C Pap at bedtime. Keep up the good work. Work on weight loss Do not drive a sleepy Follow with Dr. Annamaria Boots in 1 year and as needed

## 2017-04-26 NOTE — Assessment & Plan Note (Signed)
Wt loss  

## 2017-04-27 LAB — HEPATITIS C ANTIBODY: HCV Ab: NEGATIVE

## 2017-04-27 NOTE — Addendum Note (Signed)
Addended by: Parke Poisson E on: 04/27/2017 12:25 PM   Modules accepted: Orders

## 2017-04-29 ENCOUNTER — Other Ambulatory Visit: Payer: Self-pay | Admitting: Obstetrics & Gynecology

## 2017-05-02 ENCOUNTER — Ambulatory Visit: Payer: Medicare Other | Admitting: Internal Medicine

## 2017-05-02 NOTE — Telephone Encounter (Signed)
Medication refill request: Estradiol Last AEX:  04/22/16 SM Next AEX: 08/01/17 SM Last MMG (if hormonal medication request): 01/19/17 BIRADS2, Density B, [facility] Refill authorized: 04/22/16 #[id number]. Please advise. Thank you.

## 2017-05-06 ENCOUNTER — Telehealth: Payer: Self-pay | Admitting: Internal Medicine

## 2017-05-08 ENCOUNTER — Other Ambulatory Visit: Payer: Self-pay | Admitting: Emergency Medicine

## 2017-05-08 MED ORDER — PREGABALIN 50 MG PO CAPS: 50.0000 mg | ORAL_CAPSULE | Freq: Three times a day (TID) | ORAL | 0 refills | Status: DC

## 2017-05-08 NOTE — Telephone Encounter (Signed)
Pt request refill  pregabalin (LYRICA) 50 MG capsule  Pt is supposed to take 3 a day. Only had one for today. Pt has appt tomorrow, but really needs the rx tonight please.  [id number] - [place], [place]

## 2017-05-09 ENCOUNTER — Ambulatory Visit (INDEPENDENT_AMBULATORY_CARE_PROVIDER_SITE_OTHER): Payer: Medicare Other | Admitting: Internal Medicine

## 2017-05-09 ENCOUNTER — Encounter: Payer: Self-pay | Admitting: Internal Medicine

## 2017-05-09 VITALS — BP 130/80 | HR 72 | Temp 98.3°F | Ht 66.0 in | Wt 244.2 lb

## 2017-05-09 DIAGNOSIS — I2510 Atherosclerotic heart disease of native coronary artery without angina pectoris: Secondary | ICD-10-CM | POA: Diagnosis not present

## 2017-05-09 DIAGNOSIS — M1990 Unspecified osteoarthritis, unspecified site: Secondary | ICD-10-CM

## 2017-05-09 DIAGNOSIS — G8929 Other chronic pain: Secondary | ICD-10-CM

## 2017-05-09 DIAGNOSIS — R7989 Other specified abnormal findings of blood chemistry: Secondary | ICD-10-CM

## 2017-05-09 DIAGNOSIS — Z79899 Other long term (current) drug therapy: Secondary | ICD-10-CM

## 2017-05-09 DIAGNOSIS — R945 Abnormal results of liver function studies: Secondary | ICD-10-CM

## 2017-05-09 NOTE — Progress Notes (Signed)
Chief Complaint  Patient presents with  . Follow-up    HPI: Terri Rodriguez 71 y.o. come in for Chronic disease management   Fu of beginning lyrica for her neur pain and ms pain .  50 mg  Tid   For a while .  She stated she thinks it's significantly improved her generalized pain although still has localized pain in her back with these difficult she cannot walk long more than a block without having to sit down. However she was able to attend her grandchild's birthday party stand for longer periods and even bowl game. This was optimistic for her. Otherwise her health joint problems have been problematic hard to do her regular hand crafts with her arthritis. Has a new CPAP machine Feels that she has gained even more weight wonders if it's from the Lyrica. At this time she's not really interested in increasing the dose of medicine because she thinks it's doing what needs to do. She noted that her liver tests haven't really changed much.   ROS: See pertinent positives and negatives per HPI.  Past Medical History:  Diagnosis Date  . ALLERGIC RHINITIS   . Anginal pain ([place])   . Cholelithiasis 06/09/08  . Complication of anesthesia 2012   went to ICU after bilateral knees ,? unstable vital signs  . Diverticulitis   . EPICONDYLITIS, LATERAL    osteoarthritis, previous joint replacements  . GERD   . Hepatic hemangioma 06/09/08  . Hiatal hernia 03/2000  . Hx of adenomatous polyp of colon 06/05/08  . Hx of cardiac catheterization 2008    clean coronarys DrKelly   . Hx of chest pain    neg cath remot hx of narrowwing lad in 2003 nl 2008, none in many years  . HYPERLIPIDEMIA   . IBS (irritable bowel syndrome)   . LEUKOPENIA, CHRONIC   . Mononucleosis 1963  . Nasal injury 06/14/2012  . RAYNAUD'S SYNDROME, HX OF    improved after cardiac meds initiated  . Rosacea    facial  . SLEEP APNEA, OBSTRUCTIVE    cpap, settings "automatic" settings 11-12  . Subacute thyroiditis   . UNSPECIFIED  ANEMIA     Family History  Problem Relation Age of Onset  . Rheum arthritis Mother   . Diabetes Mother   . Heart failure Mother   . Thrombocytopenia Mother   . Sleep apnea Mother   . Ulcers Mother        PUD  . Deep vein thrombosis Father   . Pulmonary embolism Father   . Osteoarthritis Father   . Atrial fibrillation Father   . Dementia Father   . Sleep apnea Father   . Sleep apnea Brother   . Obesity Brother   . Sleep apnea Brother   . Obesity Brother   . Colon cancer Maternal Aunt 9  . Colon cancer Maternal Aunt 90  . Pancreatic cancer Unknown   . Uterine cancer Unknown   . Leukemia Unknown   . Inflammatory bowel disease Unknown        aunt  . Congenital adrenal hyperplasia Grandchild   . Atrial fibrillation Brother     Social History   Social History  . Marital status: Married    Spouse name: N/A  . Number of children: 1  . Years of education: N/A   Occupational History  . Physician    Social History Main Topics  . Smoking status: Never Smoker  . Smokeless tobacco: Never Used  . Alcohol  use 8.4 oz/week    14 Glasses of wine per week     Comment: 1 glass nightly  . Drug use: No  . Sexual activity: Yes    Partners: Male    Birth control/ protection: Surgical     Comment: TAH/BSO   Other Topics Concern  . 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       Outpatient Medications Prior to Visit  Medication Sig Dispense Refill  . acetaminophen (TYLENOL) 500 MG tablet Take 1,000 mg by mouth every 6 (six) hours as needed for mild pain or headache.     . Alum Hydroxide-Mag Carbonate (GAVISCON PO) Take 1 tablet by mouth 4 (four) times daily as needed (heart burn).     Marland Kitchen amLODipine (NORVASC) 5 MG tablet Take 1 tablet (5 mg total) by mouth daily. 90 tablet 3  . amoxicillin (AMOXIL) 500 MG capsule 2,000 mg. 1 hour prior to dental procedures.    Marland Kitchen aspirin 81  MG tablet Take 81 mg by mouth daily.    Marland Kitchen atorvastatin (LIPITOR) 20 MG tablet Take 1 tablet (20 mg total) by mouth every evening. 90 tablet 3  . Azelaic Acid (FINACEA) 15 % cream Apply 1 application topically 2 (two) times daily. After skin is thoroughly washed and patted dry, gently but thoroughly massage a thin film of azelaic acid cream into the affected area twice daily, in the morning and evening.    . carvedilol (COREG) 3.125 MG tablet Take 1 tablet (3.125 mg total) by mouth 2 (two) times daily with a meal. 180 tablet 3  . celecoxib (CELEBREX) 200 MG capsule TAKE 1 CAPSULE BY MOUTH DAILY 90 capsule 0  . Cholecalciferol (VITAMIN D3) 2000 UNITS TABS Take 1 tablet by mouth daily.    . clindamycin (CLEOCIN T) 1 % external solution 2 (two) times daily as needed.  3  . DULoxetine (CYMBALTA) 60 MG capsule TAKE 1 CAPSULE BY MOUTH EVERY EVENING 90 capsule 0  . estradiol (ESTRACE) 0.5 MG tablet TAKE 1/2 TABLET(0.25 MG) BY MOUTH DAILY 45 tablet 1  . fexofenadine (ALLEGRA) 180 MG tablet Take 180 mg by mouth daily. Takes in PM    . metroNIDAZOLE (METROGEL) 1 % gel Apply 1 application topically daily. Apply to face    . Multiple Vitamins-Minerals (CENTRUM ADULTS PO) Take 1 tablet by mouth daily.    . nitroGLYCERIN (NITROSTAT) 0.4 MG SL tablet PLACE 1 TABLET UNDER THE TONGUE EVERY 5 MINUTES AS NEEDED FOR CHEST PAIN 25 tablet 3  . omeprazole-sodium bicarbonate (ZEGERID) 40-1100 MG per capsule Take 1 capsule by mouth daily before breakfast.     . pregabalin (LYRICA) 50 MG capsule Take 1 capsule (50 mg total) by mouth 3 (three) times daily. 90 capsule 0  . ramipril (ALTACE) 10 MG capsule Take 1 capsule (10 mg total) by mouth every evening. 90 capsule 3  . valACYclovir (VALTREX) 1000 MG tablet Take 1 tablet (1,000 mg total) by mouth 2 (two) times daily as needed. For outbreaks 180 tablet 1   No facility-administered medications prior to visit.      EXAM:  BP 130/80 (BP Location: Right Arm, Patient  Position: Sitting, Cuff Size: Normal)   Pulse 72   Temp 98.3 F (36.8 C) (Oral)   Ht 5\' 6"  (1.676 m)   Wt 244 lb 3.2 oz (110.8 kg)  BMI 39.41 kg/m   Body mass index is 39.41 kg/m.  GENERAL: vitals reviewed and listed above, alert, oriented, appears well hydrated and in no acute distress HEENT: atraumatic, conjunctiva  clear, no obvious abnormalities on inspection of external nose and ears OP : no lesion edema or exudate  NECK: no obvious masses on inspection palpation  LUNGS: clear to auscultation bilaterally, no wheezes, rales or rhonchi, good air movement CV: HRRR, no clubbing cyanosis or  peripheral edema nl cap refill  MS: moves all extremities without noticeable focal  abnormality PSYCH: pleasant and cooperative, no obvious depression or anxiety Lab Results  Component Value Date   WBC 3.7 (L) 01/25/2017   HGB 13.2 01/25/2017   HCT 38.0 01/25/2017   PLT 218.0 01/25/2017   GLUCOSE 93 01/25/2017   CHOL 165 01/25/2017   TRIG 80.0 01/25/2017   HDL 76.10 01/25/2017   LDLCALC 73 01/25/2017   ALT 33 04/26/2017   AST 42 (H) 04/26/2017   NA 140 01/25/2017   K 4.2 01/25/2017   CL 106 01/25/2017   CREATININE 0.60 01/25/2017   BUN 20 01/25/2017   CO2 27 01/25/2017   TSH 1.58 01/25/2017   INR 0.97 07/10/2014   HGBA1C 5.3 07/29/2016   BP Readings from Last 3 Encounters:  05/09/17 130/80  04/26/17 106/60  03/10/17 112/64   Wt Readings from Last 3 Encounters:  05/09/17 244 lb 3.2 oz (110.8 kg)  04/26/17 241 lb (109.3 kg)  03/10/17 245 lb (111.1 kg)     ASSESSMENT AND PLAN:  Discussed the following assessment and plan:  Arthritis  Other chronic pain  Medication management  Abnormal LFTs - minor stable neg hepc serology Will stay on the 50 mg 3 times a day if she wishes would add a 50s to 100 mg dose on a given time of the day of having excessive pain avoid sudden discontinuation she is aware blood she is able to be a little more active and then. Attention to the  rest of her lifestyle instead of chronic pain.   Plan rov in 6 months or as needed . Will follow lfts  Try voltaren on hands before bed instead of prn.  -Patient advised to return or notify health care team  if  new concerns arise. Total visit 71mins > 50% spent counseling and coordinating care as indicated in above note and in instructions to patient . counseled  Patient Instructions  Stay on the lyrica  50 tid  And consider increasing to 100 mg    If  needed.     Plan ROV  In 6 months  Or as needed .   You will lose 5 #   Mariann Laster K. Panosh M.D.

## 2017-05-09 NOTE — Patient Instructions (Addendum)
Stay on the lyrica  50 tid  And consider increasing to 100 mg    If  needed.     Plan ROV  In 6 months  Or as needed .   You will lose 5 #

## 2017-06-01 ENCOUNTER — Other Ambulatory Visit: Payer: Self-pay | Admitting: Internal Medicine

## 2017-06-18 DIAGNOSIS — M19071 Primary osteoarthritis, right ankle and foot: Secondary | ICD-10-CM | POA: Insufficient documentation

## 2017-06-18 DIAGNOSIS — Z8719 Personal history of other diseases of the digestive system: Secondary | ICD-10-CM | POA: Insufficient documentation

## 2017-06-18 DIAGNOSIS — M5136 Other intervertebral disc degeneration, lumbar region: Secondary | ICD-10-CM | POA: Insufficient documentation

## 2017-06-18 DIAGNOSIS — M19072 Primary osteoarthritis, left ankle and foot: Secondary | ICD-10-CM

## 2017-06-18 DIAGNOSIS — M5030 Other cervical disc degeneration, unspecified cervical region: Secondary | ICD-10-CM | POA: Insufficient documentation

## 2017-06-18 DIAGNOSIS — Z96643 Presence of artificial hip joint, bilateral: Secondary | ICD-10-CM | POA: Insufficient documentation

## 2017-06-18 DIAGNOSIS — M51369 Other intervertebral disc degeneration, lumbar region without mention of lumbar back pain or lower extremity pain: Secondary | ICD-10-CM | POA: Insufficient documentation

## 2017-06-18 DIAGNOSIS — I7300 Raynaud's syndrome without gangrene: Secondary | ICD-10-CM | POA: Insufficient documentation

## 2017-06-18 DIAGNOSIS — Z96653 Presence of artificial knee joint, bilateral: Secondary | ICD-10-CM | POA: Insufficient documentation

## 2017-06-20 ENCOUNTER — Ambulatory Visit (INDEPENDENT_AMBULATORY_CARE_PROVIDER_SITE_OTHER): Payer: Medicare Other | Admitting: Rheumatology

## 2017-06-20 ENCOUNTER — Encounter: Payer: Self-pay | Admitting: Rheumatology

## 2017-06-20 VITALS — BP 115/59 | HR 77 | Resp 14 | Ht 66.0 in | Wt 246.0 lb

## 2017-06-20 DIAGNOSIS — M19072 Primary osteoarthritis, left ankle and foot: Secondary | ICD-10-CM

## 2017-06-20 DIAGNOSIS — Z8719 Personal history of other diseases of the digestive system: Secondary | ICD-10-CM

## 2017-06-20 DIAGNOSIS — Z96653 Presence of artificial knee joint, bilateral: Secondary | ICD-10-CM

## 2017-06-20 DIAGNOSIS — Z96643 Presence of artificial hip joint, bilateral: Secondary | ICD-10-CM | POA: Diagnosis not present

## 2017-06-20 DIAGNOSIS — I7300 Raynaud's syndrome without gangrene: Secondary | ICD-10-CM

## 2017-06-20 DIAGNOSIS — M19041 Primary osteoarthritis, right hand: Secondary | ICD-10-CM | POA: Diagnosis not present

## 2017-06-20 DIAGNOSIS — R739 Hyperglycemia, unspecified: Secondary | ICD-10-CM

## 2017-06-20 DIAGNOSIS — Z6841 Body Mass Index (BMI) 40.0 and over, adult: Secondary | ICD-10-CM | POA: Diagnosis not present

## 2017-06-20 DIAGNOSIS — M19071 Primary osteoarthritis, right ankle and foot: Secondary | ICD-10-CM

## 2017-06-20 DIAGNOSIS — M19042 Primary osteoarthritis, left hand: Secondary | ICD-10-CM

## 2017-06-20 DIAGNOSIS — M5030 Other cervical disc degeneration, unspecified cervical region: Secondary | ICD-10-CM | POA: Diagnosis not present

## 2017-06-20 DIAGNOSIS — G4733 Obstructive sleep apnea (adult) (pediatric): Secondary | ICD-10-CM | POA: Diagnosis not present

## 2017-06-20 DIAGNOSIS — M5136 Other intervertebral disc degeneration, lumbar region: Secondary | ICD-10-CM | POA: Diagnosis not present

## 2017-06-20 DIAGNOSIS — K219 Gastro-esophageal reflux disease without esophagitis: Secondary | ICD-10-CM | POA: Diagnosis not present

## 2017-06-20 DIAGNOSIS — G8929 Other chronic pain: Secondary | ICD-10-CM

## 2017-06-20 DIAGNOSIS — I2510 Atherosclerotic heart disease of native coronary artery without angina pectoris: Secondary | ICD-10-CM | POA: Diagnosis not present

## 2017-06-20 NOTE — Patient Instructions (Addendum)
Cervical Strain and Sprain Rehab Ask your health care provider which exercises are safe for you. Do exercises exactly as told by your health care provider and adjust them as directed. It is normal to feel mild stretching, pulling, tightness, or discomfort as you do these exercises, but you should stop right away if you feel sudden pain or your pain gets worse.Do not begin these exercises until told by your health care provider. Stretching and range of motion exercises These exercises warm up your muscles and joints and improve the movement and flexibility of your neck. These exercises also help to relieve pain, numbness, and tingling. Exercise A: Cervical side bend  1. Using good posture, sit on a stable chair or stand up. 2. Without moving your shoulders, slowly tilt your left / right ear to your shoulder until you feel a stretch in your neck muscles. You should be looking straight ahead. 3. Hold for __________ seconds. 4. Repeat with the other side of your neck. Repeat __________ times. Complete this exercise __________ times a day. Exercise B: Cervical rotation  1. Using good posture, sit on a stable chair or stand up. 2. Slowly turn your head to the side as if you are looking over your left / right shoulder. ? Keep your eyes level with the ground. ? Stop when you feel a stretch along the side and the back of your neck. 3. Hold for __________ seconds. 4. Repeat this by turning to your other side. Repeat __________ times. Complete this exercise __________ times a day. Exercise C: Thoracic extension and pectoral stretch 1. Roll a towel or a small blanket so it is about 4 inches (10 cm) in diameter. 2. Lie down on your back on a firm surface. 3. Put the towel lengthwise, under your spine in the middle of your back. It should not be not under your shoulder blades. The towel should line up with your spine from your middle back to your lower back. 4. Put your hands behind your head and let your  elbows fall out to your sides. 5. Hold for __________ seconds. Repeat __________ times. Complete this exercise __________ times a day. Strengthening exercises These exercises build strength and endurance in your neck. Endurance is the ability to use your muscles for a long time, even after your muscles get tired. Exercise D: Upper cervical flexion, isometric 1. Lie on your back with a thin pillow behind your head and a small rolled-up towel under your neck. 2. Gently tuck your chin toward your chest and nod your head down to look toward your feet. Do not lift your head off the pillow. 3. Hold for __________ seconds. 4. Release the tension slowly. Relax your neck muscles completely before you repeat this exercise. Repeat __________ times. Complete this exercise __________ times a day. Exercise E: Cervical extension, isometric  1. Stand about 6 inches (15 cm) away from a wall, with your back facing the wall. 2. Place a soft object, about 6-8 inches (15-20 cm) in diameter, between the back of your head and the wall. A soft object could be a small pillow, a ball, or a folded towel. 3. Gently tilt your head back and press into the soft object. Keep your jaw and forehead relaxed. 4. Hold for __________ seconds. 5. Release the tension slowly. Relax your neck muscles completely before you repeat this exercise. Repeat __________ times. Complete this exercise __________ times a day. Posture and body mechanics  Body mechanics refers to the movements and positions of   your body while you do your daily activities. Posture is part of body mechanics. Good posture and healthy body mechanics can help to relieve stress in your body's tissues and joints. Good posture means that your spine is in its natural S-curve position (your spine is neutral), your shoulders are pulled back slightly, and your head is not tipped forward. The following are general guidelines for applying improved posture and body mechanics to  your everyday activities. Standing  When standing, keep your spine neutral and keep your feet about hip-width apart. Keep a slight bend in your knees. Your ears, shoulders, and hips should line up.  When you do a task in which you stand in one place for a long time, place one foot up on a stable object that is 2-4 inches (5-10 cm) high, such as a footstool. This helps keep your spine neutral. Sitting   When sitting, keep your spine neutral and your keep feet flat on the floor. Use a footrest, if necessary, and keep your thighs parallel to the floor. Avoid rounding your shoulders, and avoid tilting your head forward.  When working at a desk or a computer, keep your desk at a height where your hands are slightly lower than your elbows. Slide your chair under your desk so you are close enough to maintain good posture.  When working at a computer, place your monitor at a height where you are looking straight ahead and you do not have to tilt your head forward or downward to look at the screen. Resting When lying down and resting, avoid positions that are most painful for you. Try to support your neck in a neutral position. You can use a contour pillow or a small rolled-up towel. Your pillow should support your neck but not push on it. This information is not intended to replace advice given to you by your health care provider. Make sure you discuss any questions you have with your health care provider. Document Released: 11/21/2005 Document Revised: 07/28/2016 Document Reviewed: 10/28/2015 Elsevier Interactive Patient Education  2018 [employer]. Knee Exercises Ask your health care provider which exercises are safe for you. Do exercises exactly as told by your health care provider and adjust them as directed. It is normal to feel mild stretching, pulling, tightness, or discomfort as you do these exercises, but you should stop right away if you feel sudden pain or your pain gets worse.Do not begin  these exercises until told by your health care provider. STRETCHING AND RANGE OF MOTION EXERCISES These exercises warm up your muscles and joints and improve the movement and flexibility of your knee. These exercises also help to relieve pain, numbness, and tingling. Exercise A: Knee Extension, Prone 5. Lie on your abdomen on a bed. 6. Place your left / right knee just beyond the edge of the surface so your knee is not on the bed. You can put a towel under your left / right thigh just above your knee for comfort. 7. Relax your leg muscles and allow gravity to straighten your knee. You should feel a stretch behind your left / right knee. 8. Hold this position for __________ seconds. 9. Scoot up so your knee is supported between repetitions. Repeat __________ times. Complete this stretch __________ times a day. Exercise B: Knee Flexion, Active  1. Lie on your back with both knees straight. If this causes back discomfort, bend your left / right knee so your foot is flat on the floor. 2. Slowly slide your left /  right heel back toward your buttocks until you feel a gentle stretch in the front of your knee or thigh. 3. Hold this position for __________ seconds. 4. Slowly slide your left / right heel back to the starting position. Repeat __________ times. Complete this exercise __________ times a day. Exercise C: Quadriceps, Prone  6. Lie on your abdomen on a firm surface, such as a bed or padded floor. 7. Bend your left / right knee and hold your ankle. If you cannot reach your ankle or pant leg, loop a belt around your foot and grab the belt instead. 8. Gently pull your heel toward your buttocks. Your knee should not slide out to the side. You should feel a stretch in the front of your thigh and knee. 9. Hold this position for __________ seconds. Repeat __________ times. Complete this stretch __________ times a day. Exercise D: Hamstring, Supine 1. Lie on your back. 2. Loop a belt or towel over  the ball of your left / right foot. The ball of your foot is on the walking surface, right under your toes. 3. Straighten your left / right knee and slowly pull on the belt to raise your leg until you feel a gentle stretch behind your knee. ? Do not let your left / right knee bend while you do this. ? Keep your other leg flat on the floor. 4. Hold this position for __________ seconds. Repeat __________ times. Complete this stretch __________ times a day. STRENGTHENING EXERCISES These exercises build strength and endurance in your knee. Endurance is the ability to use your muscles for a long time, even after they get tired. Exercise E: Quadriceps, Isometric  6. Lie on your back with your left / right leg extended and your other knee bent. Put a rolled towel or small pillow under your knee if told by your health care provider. 7. Slowly tense the muscles in the front of your left / right thigh. You should see your kneecap slide up toward your hip or see increased dimpling just above the knee. This motion will push the back of the knee toward the floor. 8. For __________ seconds, keep the muscle as tight as you can without increasing your pain. 9. Relax the muscles slowly and completely. Repeat __________ times. Complete this exercise __________ times a day. Exercise F: Straight Leg Raises - Quadriceps 1. Lie on your back with your left / right leg extended and your other knee bent. 2. Tense the muscles in the front of your left / right thigh. You should see your kneecap slide up or see increased dimpling just above the knee. Your thigh may even shake a bit. 3. Keep these muscles tight as you raise your leg 4-6 inches (10-15 cm) off the floor. Do not let your knee bend. 4. Hold this position for __________ seconds. 5. Keep these muscles tense as you lower your leg. 6. Relax your muscles slowly and completely after each repetition. Repeat __________ times. Complete this exercise __________ times a  day. Exercise G: Hamstring, Isometric 1. Lie on your back on a firm surface. 2. Bend your left / right knee approximately __________ degrees. 3. Dig your left / right heel into the surface as if you are trying to pull it toward your buttocks. Tighten the muscles in the back of your thighs to dig as hard as you can without increasing any pain. 4. Hold this position for __________ seconds. 5. Release the tension gradually and allow your muscles to relax  completely for __________ seconds after each repetition. Repeat __________ times. Complete this exercise __________ times a day. Exercise H: Hamstring Curls  If told by your health care provider, do this exercise while wearing ankle weights. Begin with __________ weights. Then increase the weight by 1 lb (0.5 kg) increments. Do not wear ankle weights that are more than __________. 1. Lie on your abdomen with your legs straight. 2. Bend your left / right knee as far as you can without feeling pain. Keep your hips flat against the floor. 3. Hold this position for __________ seconds. 4. Slowly lower your leg to the starting position.  Repeat __________ times. Complete this exercise __________ times a day. Exercise I: Squats (Quadriceps) 1. Stand in front of a table, with your feet and knees pointing straight ahead. You may rest your hands on the table for balance but not for support. 2. Slowly bend your knees and lower your hips like you are going to sit in a chair. ? Keep your weight over your heels, not over your toes. ? Keep your lower legs upright so they are parallel with the table legs. ? Do not let your hips go lower than your knees. ? Do not bend lower than told by your health care provider. ? If your knee pain increases, do not bend as low. 3. Hold the squat position for __________ seconds. 4. Slowly push with your legs to return to standing. Do not use your hands to pull yourself to standing. Repeat __________ times. Complete this  exercise __________ times a day. Exercise J: Wall Slides (Quadriceps)  1. Lean your back against a smooth wall or door while you walk your feet out 18-24 inches (46-61 cm) from it. 2. Place your feet hip-width apart. 3. Slowly slide down the wall or door until your knees bend __________ degrees. Keep your knees over your heels, not over your toes. Keep your knees in line with your hips. 4. Hold for __________ seconds. Repeat __________ times. Complete this exercise __________ times a day. Exercise K: Straight Leg Raises - Hip Abductors 1. Lie on your side with your left / right leg in the top position. Lie so your head, shoulder, knee, and hip line up. You may bend your bottom knee to help you keep your balance. 2. Roll your hips slightly forward so your hips are stacked directly over each other and your left / right knee is facing forward. 3. Leading with your heel, lift your top leg 4-6 inches (10-15 cm). You should feel the muscles in your outer hip lifting. ? Do not let your foot drift forward. ? Do not let your knee roll toward the ceiling. 4. Hold this position for __________ seconds. 5. Slowly return your leg to the starting position. 6. Let your muscles relax completely after each repetition. Repeat __________ times. Complete this exercise __________ times a day. Exercise L: Straight Leg Raises - Hip Extensors 1. Lie on your abdomen on a firm surface. You can put a pillow under your hips if that is more comfortable. 2. Tense the muscles in your buttocks and lift your left / right leg about 4-6 inches (10-15 cm). Keep your knee straight as you lift your leg. 3. Hold this position for __________ seconds. 4. Slowly lower your leg to the starting position. 5. Let your leg relax completely after each repetition. Repeat __________ times. Complete this exercise __________ times a day. This information is not intended to replace advice given to you by your health  care provider. Make sure you  discuss any questions you have with your health care provider. Document Released: 10/05/2005 Document Revised: 08/15/2016 Document Reviewed: 09/27/2015 Elsevier Interactive Patient Education  2018 [employer] Exercises Hand exercises can be helpful to almost anyone. These exercises can strengthen the hands, improve flexibility and movement, and increase blood flow to the hands. These results can make work and daily tasks easier. Hand exercises can be especially helpful for people who have joint pain from arthritis or have nerve damage from overuse (carpal tunnel syndrome). These exercises can also help people who have injured a hand. Most of these hand exercises are fairly gentle stretching routines. You can do them often throughout the day. Still, it is a good idea to ask your health care provider which exercises would be best for you. Warming your hands before exercise may help to reduce stiffness. You can do this with gentle massage or by placing your hands in warm water for 15 minutes. Also, make sure you pay attention to your level of hand pain as you begin an exercise routine. Exercises Knuckle Bend Repeat this exercise 5-10 times with each hand. 10. Stand or sit with your arm, hand, and all five fingers pointed straight up. Make sure your wrist is straight. 11. Gently and slowly bend your fingers down and inward until the tips of your fingers are touching the tops of your palm. 12. Hold this position for a few seconds. 13. Extend your fingers out to their original position, all pointing straight up again.  Finger Fan Repeat this exercise 5-10 times with each hand. 5. Hold your arm and hand out in front of you. Keep your wrist straight. 6. Squeeze your hand into a fist. 7. Hold this position for a few seconds. 8. Edison Simon out, or spread apart, your hand and fingers as much as possible, stretching every joint fully.  Tabletop Repeat this exercise 5-10 times with each hand. 10. Stand or  sit with your arm, hand, and all five fingers pointed straight up. Make sure your wrist is straight. 11. Gently and slowly bend your fingers at the knuckles where they meet the hand until your hand is making an upside-down L shape. Your fingers should form a tabletop. 12. Hold this position for a few seconds. 13. Extend your fingers out to their original position, all pointing straight up again.  Making Os Repeat this exercise 5-10 times with each hand. 5. Stand or sit with your arm, hand, and all five fingers pointed straight up. Make sure your wrist is straight. 6. Make an O shape by touching your pointer finger to your thumb. Hold for a few seconds. Then open your hand wide. 7. Repeat this motion with each finger on your hand.  Table Spread Repeat this exercise 5-10 times with each hand. 10. Place your hand on a table with your palm facing down. Make sure your wrist is straight. 11. Spread your fingers out as much as possible. Hold this position for a few seconds. 12. Slide your fingers back together again. Hold for a few seconds.  Ball Grip  Repeat this exercise 10-15 times with each hand. 7. Hold a tennis ball or another soft ball in your hand. 8. While slowly increasing pressure, squeeze the ball as hard as possible. 9. Squeeze as hard as you can for 3-5 seconds. 10. Relax and repeat.  Wrist Curls Repeat this exercise 10-15 times with each hand. 6. Sit in a chair that has armrests. 7. Hold a  light weight in your hand, such as a dumbbell that weighs 1-3 pounds (0.5-1.4 kg). Ask your health care provider what weight would be best for you. 8. Rest your hand just over the end of the chair arm with your palm facing up. 9. Gently pivot your wrist up and down while holding the weight. Do not twist your wrist from side to side.  Contact a health care provider if:  Your hand pain or discomfort gets much worse when you do an exercise.  Your hand pain or discomfort does not improve  within 2 hours after you exercise. If you have any of these problems, stop doing these exercises right away. Do not do them again unless your health care provider says that you can. Get help right away if:  You develop sudden, severe hand pain. If this happens, stop doing these exercises right away. Do not do them again unless your health care provider says that you can. This information is not intended to replace advice given to you by your health care provider. Make sure you discuss any questions you have with your health care provider. Document Released: 11/02/2015 Document Revised: 04/28/2016 Document Reviewed: 06/01/2015 Elsevier Interactive Patient Education  [employer].

## 2017-06-20 NOTE — Progress Notes (Signed)
Office Visit Note  Patient: Terri Rodriguez             Date of Birth: February 04, 1946           MRN: [id number]             PCP: Burnis Medin, MD Referring: Burnis Medin, MD Visit Date: 06/20/2017 Occupation: @GUAROCC @    Subjective:  Pain hands and feet.   History of Present Illness: Terri Rodriguez is a 71 y.o. female with history of osteoarthritis and disc disease. She is recovering from bilateral total hip and total knee replacements gradually. She states she is still have some discomfort in her hands and feet on regular basis. She continues to have discomfort in her C-spine and her lower back as well. She was started on pregabalin by her PCP which is been very helpful. She describes her pain level on a scale of 0-10 about 5. She does have some balance issues.  Activities of Daily Living:  Patient reports morning stiffness for 1 hour.   Patient Reports nocturnal pain.  Difficulty dressing/grooming: Denies Difficulty climbing stairs: Reports Difficulty getting out of chair: Denies Difficulty using hands for taps, buttons, cutlery, and/or writing: Reports   Review of Systems  Constitutional: Positive for fatigue. Negative for night sweats, weight gain, weight loss and weakness.  HENT: Negative for mouth sores, trouble swallowing, trouble swallowing, mouth dryness and nose dryness.   Eyes: Negative for pain, redness, visual disturbance and dryness.  Respiratory: Negative for cough, shortness of breath and difficulty breathing.   Cardiovascular: Negative for chest pain, palpitations, hypertension, irregular heartbeat and swelling in legs/feet.  Gastrointestinal: Negative for blood in stool, constipation and diarrhea.  Endocrine: Negative for increased urination.  Genitourinary: Negative for vaginal dryness.  Musculoskeletal: Positive for arthralgias, joint pain and morning stiffness. Negative for joint swelling, myalgias, muscle weakness, muscle tenderness and myalgias.  Skin:  Negative for color change, rash, hair loss, skin tightness, ulcers and sensitivity to sunlight.  Allergic/Immunologic: Negative for susceptible to infections.  Neurological: Negative for dizziness, memory loss and night sweats.  Hematological: Negative for swollen glands.  Psychiatric/Behavioral: Positive for sleep disturbance. Negative for depressed mood. The patient is not nervous/anxious.        On CPAP    PMFS History:  Patient Active Problem List   Diagnosis Date Noted  . Primary osteoarthritis of both feet 06/18/2017  . Status post bilateral total hip replacement 06/18/2017  . Status post total knee replacement, bilateral 06/18/2017  . Degenerative disc disease, lumbar status post fusion 06/18/2017  . Degenerative disc disease, cervical 06/18/2017  . Raynaud's disease without gangrene 06/18/2017  . History of cholelithiasis 06/18/2017  . Other chronic pain 02/01/2017  . Visit for preventive health examination 01/21/2015  . Medicare annual wellness visit, subsequent 01/21/2015  . BMI 40.0-44.9, adult ([place]) 01/21/2015  . Hyperlipidemia LDL goal <70 01/12/2015  . Morbid obesity ([place]) 01/12/2015  . Hyperglycemia 01/10/2014  . Mild CAD 01/06/2014  . Medication monitoring encounter 09/22/2012  . Hx of cardiac catheterization   . Postmenopausal HRT (hormone replacement therapy) 09/20/2012  . Adenomatous polyp of colon 06/14/2012  . ROSACEA 04/26/2010  . Obstructive sleep apnea 09/09/2009  . LEUKOPENIA, CHRONIC 01/08/2008  . HYPERLIPIDEMIA 08/17/2007  . ALLERGIC RHINITIS 08/17/2007  . GERD 08/17/2007  . Primary osteoarthritis of both hands 08/17/2007    Past Medical History:  Diagnosis Date  . ALLERGIC RHINITIS   . Anginal pain ([place])   . Cholelithiasis 06/09/08  .  Complication of anesthesia 2012   went to ICU after bilateral knees ,? unstable vital signs  . Diverticulitis   . EPICONDYLITIS, LATERAL    osteoarthritis, previous joint replacements  . GERD   . Hepatic  hemangioma 06/09/08  . Hiatal hernia 03/2000  . Hx of adenomatous polyp of colon 06/05/08  . Hx of cardiac catheterization 2008    clean coronarys DrKelly   . Hx of chest pain    neg cath remot hx of narrowwing lad in 2003 nl 2008, none in many years  . HYPERLIPIDEMIA   . IBS (irritable bowel syndrome)   . LEUKOPENIA, CHRONIC   . Mononucleosis 1963  . Nasal injury 06/14/2012  . RAYNAUD'S SYNDROME, HX OF    improved after cardiac meds initiated  . Rosacea    facial  . SLEEP APNEA, OBSTRUCTIVE    cpap, settings "automatic" settings 11-12  . Subacute thyroiditis   . UNSPECIFIED ANEMIA     Family History  Problem Relation Age of Onset  . Rheum arthritis Mother   . Diabetes Mother   . Heart failure Mother   . Thrombocytopenia Mother   . Sleep apnea Mother   . Ulcers Mother        PUD  . Deep vein thrombosis Father   . Pulmonary embolism Father   . Osteoarthritis Father   . Atrial fibrillation Father   . Dementia Father   . Sleep apnea Father   . Sleep apnea Brother   . Obesity Brother   . Sleep apnea Brother   . Obesity Brother   . Colon cancer Maternal Aunt 65  . Colon cancer Maternal Aunt 90  . Pancreatic cancer Unknown   . Uterine cancer Unknown   . Leukemia Unknown   . Inflammatory bowel disease Unknown        aunt  . Congenital adrenal hyperplasia Grandchild   . Atrial fibrillation Brother    Past Surgical History:  Procedure Laterality Date  . ABDOMINAL HYSTERECTOMY    . BACK SURGERY  02/06/2008  . CARDIAC CATHETERIZATION  04/07/2007   Noncritical coronary artery disease. Contiue medical therapy.  Marland Kitchen CARDIAC CATHETERIZATION  12/03/2007   Normal LV function. Mild angiographic mitral valve prolapse. Normal coronary arteries.  Marland Kitchen CARDIOVASCULAR STRESS TEST  11/09/2007   Moderate ischemia in Mid Anterior, Mid Anteroseptal, Apical Anterior, and Apical Septal regions. EKG negative for ischemia.  Marland Kitchen [place]  . dental implants  11/2002,11/2011,11/2012    . DILATION AND CURETTAGE OF UTERUS    . JOINT REPLACEMENT  09/21/2011   bilateral knee  . KNEE ARTHROSCOPY     bilateral  . lumbar surgery fixation with disc replacement  10/08   Left  . NASAL SEPTOPLASTY W/ TURBINOPLASTY    . NOCTURNAL POLYSOMNOGRAM  12/31/2006   Severe obstructive sleep apnea. AHI-87/hr  . REPLACEMENT TOTAL KNEE     bilateral  . TONSILLECTOMY    . TONSILLECTOMY  1952  . TOTAL HIP ARTHROPLASTY Left 10/21/2013   Procedure: LEFT TOTAL HIP ARTHROPLASTY ANTERIOR APPROACH;  Surgeon: [name], MD;  Location: [facility];  Service: Orthopedics;  Laterality: Left;  . TOTAL HIP ARTHROPLASTY Right 07/16/2014   Procedure: RIGHT TOTAL HIP ARTHROPLASTY ANTERIOR APPROACH;  Surgeon: [name], MD;  Location: [facility];  Service: Orthopedics;  Laterality: Right;  . TRANSTHORACIC ECHOCARDIOGRAM  11/09/2007   EF 16%, LV systolic function normal. Mild aortic root dilation.  . 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        Objective: Vital Signs: BP (!) 115/59   Pulse 77   Resp 14   Ht 5\' 6"  (1.676 m)   Wt 246 lb (111.6 kg)   BMI 39.71 kg/m    Physical Exam  Constitutional: She is oriented to person, place, and time. She appears well-developed and well-nourished.  HENT:  Head: Normocephalic and atraumatic.  Eyes: Conjunctivae and EOM are normal.  Neck: Normal range of motion.  Cardiovascular: Normal rate, regular rhythm, normal heart sounds and intact distal pulses.   Pulmonary/Chest: Effort normal and breath sounds normal.  Abdominal: Soft. Bowel sounds are normal.  Lymphadenopathy:    She has no cervical adenopathy.  Neurological: She is alert and oriented to person, place, and time.  Skin: Skin is warm and dry. Capillary refill takes less than 2 seconds.  Psychiatric: She has a  normal mood and affect. Her behavior is normal.  Nursing note and vitals reviewed.    Musculoskeletal Exam: C-spine limited lateral rotation limited extension. Lumbar spine limited painful range of motion. Shoulder joints elbow joints are good range of motion. She has DIP PIP thickening in her hands with subluxation of some of her DIP joints. She has bilateral total hip and bilateral total knee replacement she has DIP PIP changes in her feet consistent with osteoarthritis.  CDAI Exam: No CDAI exam completed.    Investigation: No additional findings.   Imaging: No results found.  Speciality Comments: No specialty comments available.    Procedures:  No procedures performed Allergies: Codeine and Crab [shellfish allergy]   Assessment / Plan:     Visit Diagnoses: Primary osteoarthritis of both hands: She has some DIP PIP thickening and subluxation of some of the DIP joints. I'll send her to physical therapy for DIP rings. Joint protection and muscle strengthening was discussed. Some hand exercises were demonstrated in the office and handout was given.  Primary osteoarthritis of both feet: Proper fitting shoes were discussed.  Status post bilateral total hip replacement: Doing well  Status post total knee replacement, bilateral: Doing well. Have given her a handout on lower extremities muscle strengthening as well.  Poor balance: Physical therapy given for fall prevention and muscle strengthening of the lower extremities.  Degenerative disc disease, cervical: Chronic pain and stiffness. Have given her a handout for C-spine exercises.  Degenerative disc disease, lumbar status post fusion: Chronic pain although the pain is manageable with pregabalin.  Raynaud's disease without gangrene - b2 GP-1 Ig A + in the past. Not active currently  BMI 40.0-44.9, adult ([place]): Weight loss diet and exercise was discussed.  Hyperglycemia  Gastroesophageal reflux disease without  esophagitis  Obstructive sleep apnea  Mild CAD  Other chronic pain - On Cymbalta, Lyrica, Celebrex by her PCP  History of cholelithiasis    Orders: No orders of the defined types were placed in this encounter.  No orders of the defined types were placed in this encounter.   Face-to-face time spent with patient was 30 minutes. 50% of time was spent in counseling and coordination of care.  Follow-Up Instructions: Return in about 1 year (around 06/20/2018) for Osteoarthritis.   Bo Merino, MD  Note - This record has been created using Editor, commissioning.  Chart creation errors have been sought,  but may not always  have been located. Such creation errors do not reflect on  the standard of medical care.

## 2017-06-24 ENCOUNTER — Other Ambulatory Visit: Payer: Self-pay | Admitting: Internal Medicine

## 2017-06-30 ENCOUNTER — Ambulatory Visit: Payer: Medicare Other | Admitting: Internal Medicine

## 2017-07-02 ENCOUNTER — Other Ambulatory Visit: Payer: Self-pay | Admitting: Internal Medicine

## 2017-07-05 DIAGNOSIS — Z1231 Encounter for screening mammogram for malignant neoplasm of breast: Secondary | ICD-10-CM | POA: Diagnosis not present

## 2017-07-05 LAB — HM MAMMOGRAPHY

## 2017-07-07 ENCOUNTER — Encounter: Payer: Self-pay | Admitting: Internal Medicine

## 2017-08-01 ENCOUNTER — Ambulatory Visit: Payer: Medicare Other | Admitting: Obstetrics & Gynecology

## 2017-08-15 ENCOUNTER — Other Ambulatory Visit: Payer: Self-pay | Admitting: Internal Medicine

## 2017-08-18 ENCOUNTER — Other Ambulatory Visit: Payer: Self-pay | Admitting: Internal Medicine

## 2017-08-18 ENCOUNTER — Encounter: Payer: Self-pay | Admitting: Internal Medicine

## 2017-08-21 NOTE — Telephone Encounter (Signed)
Please refill her Lyrica dispense 90 refill 4.   My readings of the  EHR   is that she is out and  We do not want her to be without  And go through a withdrawal syndrome .

## 2017-08-22 ENCOUNTER — Other Ambulatory Visit: Payer: Self-pay | Admitting: Emergency Medicine

## 2017-08-22 ENCOUNTER — Telehealth: Payer: Self-pay | Admitting: Internal Medicine

## 2017-08-22 MED ORDER — PREGABALIN 50 MG PO CAPS: 50.0000 mg | ORAL_CAPSULE | Freq: Three times a day (TID) | ORAL | 4 refills | Status: DC

## 2017-08-22 MED ORDER — PREGABALIN 50 MG PO CAPS: 50.0000 mg | ORAL_CAPSULE | Freq: Three times a day (TID) | ORAL | 2 refills | Status: DC

## 2017-08-22 NOTE — Telephone Encounter (Signed)
Pt need a refill on lyrica 50 mg #90 send to [facility]

## 2017-08-22 NOTE — Telephone Encounter (Signed)
Medication has been sent to pharmacy. Nothing further needed

## 2017-08-23 NOTE — Telephone Encounter (Signed)
Rx called in 08/22/17 Pt aware. Nothing further needed.

## 2017-08-25 ENCOUNTER — Encounter: Payer: Self-pay | Admitting: Internal Medicine

## 2017-09-19 DIAGNOSIS — Z23 Encounter for immunization: Secondary | ICD-10-CM | POA: Diagnosis not present

## 2017-09-21 ENCOUNTER — Ambulatory Visit (INDEPENDENT_AMBULATORY_CARE_PROVIDER_SITE_OTHER): Payer: Medicare Other | Admitting: Internal Medicine

## 2017-09-21 ENCOUNTER — Encounter: Payer: Self-pay | Admitting: Internal Medicine

## 2017-09-21 VITALS — BP 124/66 | HR 76 | Ht 66.0 in | Wt 248.2 lb

## 2017-09-21 DIAGNOSIS — I2510 Atherosclerotic heart disease of native coronary artery without angina pectoris: Secondary | ICD-10-CM | POA: Diagnosis not present

## 2017-09-21 DIAGNOSIS — Z6841 Body Mass Index (BMI) 40.0 and over, adult: Secondary | ICD-10-CM

## 2017-09-21 DIAGNOSIS — G4733 Obstructive sleep apnea (adult) (pediatric): Secondary | ICD-10-CM | POA: Diagnosis not present

## 2017-09-21 NOTE — Patient Instructions (Addendum)
Order- DME [facility]   Continue CPAP auto 4-18, mask of choice, humidifier, supplies,                    Please add AirView or Download Card and provide download result  Please call if we can help
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
## 2017-09-26 ENCOUNTER — Other Ambulatory Visit: Payer: Self-pay | Admitting: Internal Medicine

## 2017-09-27 ENCOUNTER — Encounter: Payer: Self-pay | Admitting: Internal Medicine

## 2017-09-27 NOTE — Telephone Encounter (Signed)
Refill request for Medication: Cymbalta 60mg  Last Filled: 06/26/17, #90 Previous / Upcoming Appt: 11/08/17  Please advise Dr Regis Bill, thanks.

## 2017-09-29 ENCOUNTER — Other Ambulatory Visit: Payer: Self-pay | Admitting: Internal Medicine

## 2017-09-30 ENCOUNTER — Other Ambulatory Visit: Payer: Self-pay | Admitting: Obstetrics & Gynecology

## 2017-10-02 NOTE — Telephone Encounter (Signed)
Medication refill request: Estrace Last AEX:  04/22/16 SM Next AEX: 01/15/18 Last MMG (if hormonal medication request): 07/05/17 BIRADS 1 negative/density b Refill authorized: #45 w/1 refill; today please advise

## 2017-10-20 DIAGNOSIS — Z23 Encounter for immunization: Secondary | ICD-10-CM | POA: Diagnosis not present

## 2017-10-20 DIAGNOSIS — L739 Follicular disorder, unspecified: Secondary | ICD-10-CM | POA: Diagnosis not present

## 2017-10-20 DIAGNOSIS — L738 Other specified follicular disorders: Secondary | ICD-10-CM | POA: Diagnosis not present

## 2017-10-20 DIAGNOSIS — L821 Other seborrheic keratosis: Secondary | ICD-10-CM | POA: Diagnosis not present

## 2017-10-20 DIAGNOSIS — L309 Dermatitis, unspecified: Secondary | ICD-10-CM | POA: Diagnosis not present

## 2017-11-04 NOTE — Assessment & Plan Note (Signed)
She understands that her body weight has adverse impact on medical problems including OSA.  Meaningful effort to lose weight is encouraged.

## 2017-11-04 NOTE — Assessment & Plan Note (Signed)
She has been very compliant with CPAP and quite comfortable with current pressure auto 4-18. Plan-we are asking her DME to provide download card or [employer]

## 2017-11-07 NOTE — Progress Notes (Signed)
Chief Complaint  Patient presents with  . Follow-up    Pt did increase the Lyrica to 100mg  but she noticed weight gain. Pt states that she has now decreased to 50mg  BID    HPI: Terri Rodriguez 71 y.o. come in for Chronic disease management  lyrica helped bpain bu concern about weight gain so decreased dose  Better tha she doesn't hurt all over but still sig arhtritis   Limiting    Helped pain       More tolerable   Frustration with getting old  Helps take care fo grand child       ROS: See pertinent positives and negatives per HPI. No cv pulm sx now    Past Medical History:  Diagnosis Date  . ALLERGIC RHINITIS   . Anginal pain ([place])   . Cholelithiasis 06/09/08  . Complication of anesthesia 2012   went to ICU after bilateral knees ,? unstable vital signs  . Diverticulitis   . EPICONDYLITIS, LATERAL    osteoarthritis, previous joint replacements  . GERD   . Hepatic hemangioma 06/09/08  . Hiatal hernia 03/2000  . Hx of adenomatous polyp of colon 06/05/08  . Hx of cardiac catheterization 2008    clean coronarys DrKelly   . Hx of chest pain    neg cath remot hx of narrowwing lad in 2003 nl 2008, none in many years  . HYPERLIPIDEMIA   . IBS (irritable bowel syndrome)   . LEUKOPENIA, CHRONIC   . Mononucleosis 1963  . Nasal injury 06/14/2012  . RAYNAUD'S SYNDROME, HX OF    improved after cardiac meds initiated  . Rosacea    facial  . SLEEP APNEA, OBSTRUCTIVE    cpap, settings "automatic" settings 11-12  . Subacute thyroiditis   . UNSPECIFIED ANEMIA     Family History  Problem Relation Age of Onset  . Rheum arthritis Mother   . Diabetes Mother   . Heart failure Mother   . Thrombocytopenia Mother   . Sleep apnea Mother   . Ulcers Mother        PUD  . Deep vein thrombosis Father   . Pulmonary embolism Father   . Osteoarthritis Father   . Atrial fibrillation Father   . Dementia Father   . Sleep apnea Father   . Sleep apnea Brother   . Obesity Brother   . Sleep apnea  Brother   . Obesity Brother   . Colon cancer Maternal Aunt 22  . Colon cancer Maternal Aunt 90  . Pancreatic cancer Unknown   . Uterine cancer Unknown   . Leukemia Unknown   . Inflammatory bowel disease Unknown        aunt  . Congenital adrenal hyperplasia Grandchild   . Atrial fibrillation Brother     Social History   Socioeconomic History  . Marital status: Married    Spouse name: None  . Number of children: 1  . Years of education: None  . Highest education level: None  Social Needs  . Financial resource strain: None  . Food insecurity - worry: None  . Food insecurity - inability: None  . Transportation needs - medical: None  . Transportation needs - non-medical: None  Occupational History  . Occupation: Physician  Tobacco Use  . Smoking status: Never Smoker  . Smokeless tobacco: Never Used  Substance and Sexual Activity  . Alcohol use: Yes    Alcohol/week: 8.4 oz    Types: 14 Glasses of wine per week  Comment: 1 glass nightly  . Drug use: No  . Sexual activity: Yes    Partners: Male    Birth control/protection: Surgical    Comment: TAH/BSO  Other Topics Concern  . 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    Outpatient Medications Prior to Visit  Medication Sig Dispense Refill  . acetaminophen (TYLENOL) 500 MG tablet Take 1,000 mg by mouth every 6 (six) hours as needed for mild pain or headache.     . Alum Hydroxide-Mag Carbonate (GAVISCON PO) Take 1 tablet by mouth 4 (four) times daily as needed (heart burn).     Marland Kitchen amLODipine (NORVASC) 5 MG tablet Take 1 tablet (5 mg total) by mouth daily. 90 tablet 3  . aspirin 81 MG tablet Take 81 mg by mouth daily.    Marland Kitchen atorvastatin (LIPITOR) 20 MG tablet Take 1 tablet (20 mg total) by mouth every evening. 90 tablet 3  . Azelaic Acid (FINACEA) 15 % cream Apply 1 application topically 2 (two) times daily. After skin is  thoroughly washed and patted dry, gently but thoroughly massage a thin film of azelaic acid cream into the affected area twice daily, in the morning and evening.    . carvedilol (COREG) 3.125 MG tablet Take 1 tablet (3.125 mg total) by mouth 2 (two) times daily with a meal. 180 tablet 3  . celecoxib (CELEBREX) 200 MG capsule TAKE 1 CAPSULE BY MOUTH DAILY 90 capsule 0  . Cholecalciferol (VITAMIN D3) 2000 UNITS TABS Take 1 tablet by mouth daily.    . clindamycin (CLEOCIN T) 1 % external solution 2 (two) times daily as needed.  3  . DULoxetine (CYMBALTA) 60 MG capsule TAKE 1 CAPSULE BY MOUTH EVERY EVENING 90 capsule 0  . estradiol (ESTRACE) 0.5 MG tablet TAKE 1/2 TABLET(0.25 MG) BY MOUTH DAILY 45 tablet 1  . fexofenadine (ALLEGRA) 180 MG tablet Take 180 mg by mouth daily. Takes in PM    . metroNIDAZOLE (METROGEL) 1 % gel Apply 1 application topically daily. Apply to face    . Multiple Vitamins-Minerals (CENTRUM ADULTS PO) Take 1 tablet by mouth daily.    . nitroGLYCERIN (NITROSTAT) 0.4 MG SL tablet PLACE 1 TABLET UNDER THE TONGUE EVERY 5 MINUTES AS NEEDED FOR CHEST PAIN 25 tablet 3  . omeprazole-sodium bicarbonate (ZEGERID) 40-1100 MG per capsule Take 1 capsule by mouth daily before breakfast.     . pregabalin (LYRICA) 50 MG capsule Take 1 capsule (50 mg total) by mouth 3 (three) times daily. (Patient taking differently: Take 50 mg by mouth 2 (two) times daily. ) 90 capsule 4  . ramipril (ALTACE) 10 MG capsule Take 1 capsule (10 mg total) by mouth every evening. 90 capsule 3  . valACYclovir (VALTREX) 1000 MG tablet Take 1 tablet (1,000 mg total) by mouth 2 (two) times daily as needed. For outbreaks 180 tablet 1  . amoxicillin (AMOXIL) 500 MG capsule 2,000 mg. 1 hour prior to dental procedures.    . clobetasol ointment (TEMOVATE) 0.05 % Apply AA of skin BID  0  . fluocinonide (LIDEX) 0.05 % external solution Apply AA on scalp PRN  0   No facility-administered medications prior to visit.       EXAM:  BP 122/78 (BP Location: Right Arm, Patient Position: Sitting, Cuff Size: Normal)   Pulse 71   Temp 97.8 F (36.6  C) (Oral)   Wt 251 lb 12.8 oz (114.2 kg)   BMI 40.64 kg/m   Body mass index is 40.64 kg/m.  GENERAL: vitals reviewed and listed above, alert, oriented, appears well hydrated and in no acute distress HEENT: atraumatic, conjunctiva  clear, no obvious abnormalities on inspection of external nose and ears NECK: no obvious masses on inspection palpation   MS: moves all extremities with noticeable focal  Abnormality  djd  Hands  PSYCH: pleasant and cooperative, no obvious depression or anxiety Lab Results  Component Value Date   WBC 3.7 (L) 01/25/2017   HGB 13.2 01/25/2017   HCT 38.0 01/25/2017   PLT 218.0 01/25/2017   GLUCOSE 93 01/25/2017   CHOL 165 01/25/2017   TRIG 80.0 01/25/2017   HDL 76.10 01/25/2017   LDLCALC 73 01/25/2017   ALT 33 04/26/2017   AST 42 (H) 04/26/2017   NA 140 01/25/2017   K 4.2 01/25/2017   CL 106 01/25/2017   CREATININE 0.60 01/25/2017   BUN 20 01/25/2017   CO2 27 01/25/2017   TSH 1.58 01/25/2017   INR 0.97 07/10/2014   HGBA1C 5.3 07/29/2016   BP Readings from Last 3 Encounters:  11/08/17 122/78  09/21/17 124/66  06/20/17 (!) 115/59   Wt Readings from Last 3 Encounters:  11/08/17 251 lb 12.8 oz (114.2 kg)  09/21/17 248 lb 3.2 oz (112.6 kg)  06/20/17 246 lb (111.6 kg)     ASSESSMENT AND PLAN:  Discussed the following assessment and plan:  Arthritis  Medication management  Other chronic pain  Primary osteoarthritis involving multiple joints  Hyperlipidemia, unspecified hyperlipidemia type  BMI 40.0-44.9, adult ([place])  Raynaud's phenomenon without gangrene Due for full labs in February   Body aching better on lyrica but se titration of dosing  Asked to  Add her topicals  Also plan for weight and stress    No other change in meds  And plan monitoring   At next visit Disc counseling    Stressed  relationship with  Spouse  Total visit 38mins > 50% spent counseling and coordinating care as indicated in above note and in instructions to patient .   -Patient advised to return or notify health care team  if  new concerns arise.  Patient Instructions  Can try adding topical.   Voltaren. Hands   Or large joints.    Can  try as needed    Extra dose lyrica.   Record   Every thing eat and drink   Accurately  For your self   without judgement.   Due for lab  Monitoring.   In February  March .     Standley Brooking. Panosh M.D.

## 2017-11-08 ENCOUNTER — Ambulatory Visit (INDEPENDENT_AMBULATORY_CARE_PROVIDER_SITE_OTHER): Payer: Medicare Other | Admitting: Internal Medicine

## 2017-11-08 ENCOUNTER — Encounter: Payer: Self-pay | Admitting: Internal Medicine

## 2017-11-08 VITALS — BP 122/78 | HR 71 | Temp 97.8°F | Wt 251.8 lb

## 2017-11-08 DIAGNOSIS — M1990 Unspecified osteoarthritis, unspecified site: Secondary | ICD-10-CM | POA: Diagnosis not present

## 2017-11-08 DIAGNOSIS — Z79899 Other long term (current) drug therapy: Secondary | ICD-10-CM

## 2017-11-08 DIAGNOSIS — M150 Primary generalized (osteo)arthritis: Secondary | ICD-10-CM

## 2017-11-08 DIAGNOSIS — I7300 Raynaud's syndrome without gangrene: Secondary | ICD-10-CM | POA: Diagnosis not present

## 2017-11-08 DIAGNOSIS — I2510 Atherosclerotic heart disease of native coronary artery without angina pectoris: Secondary | ICD-10-CM

## 2017-11-08 DIAGNOSIS — Z6841 Body Mass Index (BMI) 40.0 and over, adult: Secondary | ICD-10-CM | POA: Diagnosis not present

## 2017-11-08 DIAGNOSIS — E785 Hyperlipidemia, unspecified: Secondary | ICD-10-CM

## 2017-11-08 DIAGNOSIS — M159 Polyosteoarthritis, unspecified: Secondary | ICD-10-CM

## 2017-11-08 DIAGNOSIS — G8929 Other chronic pain: Secondary | ICD-10-CM | POA: Diagnosis not present

## 2017-11-08 MED ORDER — DICLOFENAC SODIUM 1 % TD GEL: 2.0000 g | Freq: Four times a day (QID) | TRANSDERMAL | 1 refills | Status: DC

## 2017-11-08 NOTE — Patient Instructions (Addendum)
Can try adding topical.   Voltaren. Hands   Or large joints.    Can  try as needed    Extra dose lyrica.   Record   Every thing eat and drink   Accurately  For your self   without judgement.   Due for lab  Monitoring.   In February  March .

## 2017-12-12 DIAGNOSIS — H11823 Conjunctivochalasis, bilateral: Secondary | ICD-10-CM | POA: Diagnosis not present

## 2017-12-12 DIAGNOSIS — H5203 Hypermetropia, bilateral: Secondary | ICD-10-CM | POA: Diagnosis not present

## 2017-12-12 DIAGNOSIS — H04123 Dry eye syndrome of bilateral lacrimal glands: Secondary | ICD-10-CM | POA: Diagnosis not present

## 2017-12-12 DIAGNOSIS — H40013 Open angle with borderline findings, low risk, bilateral: Secondary | ICD-10-CM | POA: Diagnosis not present

## 2017-12-12 DIAGNOSIS — H524 Presbyopia: Secondary | ICD-10-CM | POA: Diagnosis not present

## 2017-12-12 DIAGNOSIS — H2513 Age-related nuclear cataract, bilateral: Secondary | ICD-10-CM | POA: Diagnosis not present

## 2017-12-27 ENCOUNTER — Other Ambulatory Visit: Payer: Self-pay | Admitting: Internal Medicine

## 2018-01-01 ENCOUNTER — Ambulatory Visit: Payer: Medicare Other | Admitting: Family Medicine

## 2018-01-15 ENCOUNTER — Other Ambulatory Visit: Payer: Self-pay

## 2018-01-15 ENCOUNTER — Ambulatory Visit (INDEPENDENT_AMBULATORY_CARE_PROVIDER_SITE_OTHER): Payer: Medicare Other | Admitting: Obstetrics & Gynecology

## 2018-01-15 ENCOUNTER — Encounter: Payer: Self-pay | Admitting: Obstetrics & Gynecology

## 2018-01-15 VITALS — BP 130/60 | HR 74 | Resp 16 | Ht 65.0 in | Wt 252.0 lb

## 2018-01-15 DIAGNOSIS — Z9889 Other specified postprocedural states: Secondary | ICD-10-CM | POA: Diagnosis not present

## 2018-01-15 DIAGNOSIS — I2510 Atherosclerotic heart disease of native coronary artery without angina pectoris: Secondary | ICD-10-CM | POA: Diagnosis not present

## 2018-01-15 DIAGNOSIS — Z124 Encounter for screening for malignant neoplasm of cervix: Secondary | ICD-10-CM | POA: Diagnosis not present

## 2018-01-15 DIAGNOSIS — Z01419 Encounter for gynecological examination (general) (routine) without abnormal findings: Secondary | ICD-10-CM | POA: Diagnosis not present

## 2018-01-15 MED ORDER — ESTRADIOL 0.5 MG PO TABS: 0.5000 mg | ORAL_TABLET | Freq: Every day | ORAL | 4 refills | Status: DC

## 2018-01-15 MED ORDER — VALACYCLOVIR HCL 1 G PO TABS: ORAL_TABLET | ORAL | 1 refills | Status: DC

## 2018-01-15 NOTE — Progress Notes (Signed)
72 y.o. G3P1 MarriedCaucasianF here for annual exam.  Reports she is "fat as ever".  Denies vaginal bleeding.    No LMP recorded. Patient has had a hysterectomy.          Sexually active: Yes.    The current method of family planning is status post hysterectomy.    Exercising: No.  The patient does not participate in regular exercise at present. Smoker:  no  Health Maintenance: Pap:  2011 History of abnormal Pap:  no MMG:  07/05/17 BIRADS 1 negative  Colonoscopy:  02/05/14 repeat 5-7 years  BMD:   05/09/14  TDaP:  01/10/14  Pneumonia vaccine(s):  04/26/10, 01/10/14  Shingrix:   09/19/12  Hep C testing: 04/26/17 negative Screening Labs: PCP, Hb today: PCP, Urine today: not collected    reports that  has never smoked. she has never used smokeless tobacco. She reports that she drinks about 8.4 oz of alcohol per week. She reports that she does not use drugs.  Past Medical History:  Diagnosis Date  . ALLERGIC RHINITIS   . Anginal pain ([place])   . Cholelithiasis 06/09/08  . Complication of anesthesia 2012   went to ICU after bilateral knees ,? unstable vital signs  . Diverticulitis   . EPICONDYLITIS, LATERAL    osteoarthritis, previous joint replacements  . GERD   . Hepatic hemangioma 06/09/08  . Hiatal hernia 03/2000  . Hx of adenomatous polyp of colon 06/05/08  . Hx of cardiac catheterization 2008    clean coronarys DrKelly   . Hx of chest pain    neg cath remot hx of narrowwing lad in 2003 nl 2008, none in many years  . HYPERLIPIDEMIA   . IBS (irritable bowel syndrome)   . LEUKOPENIA, CHRONIC   . Mononucleosis 1963  . Nasal injury 06/14/2012  . RAYNAUD'S SYNDROME, HX OF    improved after cardiac meds initiated  . Rosacea    facial  . SLEEP APNEA, OBSTRUCTIVE    cpap, settings "automatic" settings 11-12  . Subacute thyroiditis   . UNSPECIFIED ANEMIA     Past Surgical History:  Procedure Laterality Date  . ABDOMINAL HYSTERECTOMY    . BACK SURGERY  02/06/2008  . CARDIAC CATHETERIZATION   04/07/2007   Noncritical coronary artery disease. Contiue medical therapy.  Marland Kitchen CARDIAC CATHETERIZATION  12/03/2007   Normal LV function. Mild angiographic mitral valve prolapse. Normal coronary arteries.  Marland Kitchen CARDIOVASCULAR STRESS TEST  11/09/2007   Moderate ischemia in Mid Anterior, Mid Anteroseptal, Apical Anterior, and Apical Septal regions. EKG negative for ischemia.  Marland Kitchen [place]  . dental implants  11/2002,11/2011,11/2012  . DILATION AND CURETTAGE OF UTERUS    . JOINT REPLACEMENT  09/21/2011   bilateral knee  . KNEE ARTHROSCOPY     bilateral  . lumbar surgery fixation with disc replacement  10/08   Left  . NASAL SEPTOPLASTY W/ TURBINOPLASTY    . NOCTURNAL POLYSOMNOGRAM  12/31/2006   Severe obstructive sleep apnea. AHI-87/hr  . REPLACEMENT TOTAL KNEE     bilateral  . TONSILLECTOMY    . TONSILLECTOMY  1952  . TOTAL HIP ARTHROPLASTY Left 10/21/2013   Procedure: LEFT TOTAL HIP ARTHROPLASTY ANTERIOR APPROACH;  Surgeon: [name], MD;  Location: [facility];  Service: Orthopedics;  Laterality: Left;  . TOTAL HIP ARTHROPLASTY Right 07/16/2014   Procedure: RIGHT TOTAL HIP ARTHROPLASTY ANTERIOR APPROACH;  Surgeon: [name], MD;  Location: [facility];  Service: Orthopedics;  Laterality: Right;  . TRANSTHORACIC  ECHOCARDIOGRAM  11/09/2007   EF 16%, LV systolic function normal. Mild aortic root dilation.  . WRIST SURGERY Left 04/03/14   ORIF "distal radial head fracture"    Current Outpatient Medications  Medication Sig Dispense Refill  . acetaminophen (TYLENOL) 500 MG tablet Take 1,000 mg by mouth every 6 (six) hours as needed for mild pain or headache.     . Alum Hydroxide-Mag Carbonate (GAVISCON PO) Take 1 tablet by mouth 4 (four) times daily as needed (heart burn).     Marland Kitchen amLODipine (NORVASC) 5 MG tablet Take 1 tablet (5 mg total) by mouth daily. 90 tablet 3  . amoxicillin (AMOXIL) 500 MG capsule 2,000 mg. 1 hour prior to dental procedures.    Marland Kitchen aspirin 81 MG tablet  Take 81 mg by mouth daily.    Marland Kitchen atorvastatin (LIPITOR) 20 MG tablet Take 1 tablet (20 mg total) by mouth every evening. 90 tablet 3  . Azelaic Acid (FINACEA) 15 % cream Apply 1 application topically 2 (two) times daily. After skin is thoroughly washed and patted dry, gently but thoroughly massage a thin film of azelaic acid cream into the affected area twice daily, in the morning and evening.    . carvedilol (COREG) 3.125 MG tablet Take 1 tablet (3.125 mg total) by mouth 2 (two) times daily with a meal. 180 tablet 3  . celecoxib (CELEBREX) 200 MG capsule TAKE 1 CAPSULE BY MOUTH DAILY 90 capsule 0  . Cholecalciferol (VITAMIN D3) 2000 UNITS TABS Take 1 tablet by mouth daily.    . clindamycin (CLEOCIN T) 1 % external solution 2 (two) times daily as needed.  3  . clobetasol ointment (TEMOVATE) 0.05 % Apply AA of skin BID  0  . diclofenac sodium (VOLTAREN) 1 % GEL Apply 2-4 g topically 4 (four) times daily. As needed for  Arthritis  Pain. 4 Tube 1  . DULoxetine (CYMBALTA) 60 MG capsule TAKE 1 CAPSULE BY MOUTH EVERY EVENING 90 capsule 0  . estradiol (ESTRACE) 0.5 MG tablet TAKE 1/2 TABLET(0.25 MG) BY MOUTH DAILY 45 tablet 1  . fexofenadine (ALLEGRA) 180 MG tablet Take 180 mg by mouth daily. Takes in PM    . fluocinonide (LIDEX) 0.05 % external solution Apply AA on scalp PRN  0  . metroNIDAZOLE (METROGEL) 1 % gel Apply 1 application topically daily. Apply to face    . Multiple Vitamins-Minerals (CENTRUM ADULTS PO) Take 1 tablet by mouth daily.    . nitroGLYCERIN (NITROSTAT) 0.4 MG SL tablet PLACE 1 TABLET UNDER THE TONGUE EVERY 5 MINUTES AS NEEDED FOR CHEST PAIN 25 tablet 3  . omeprazole-sodium bicarbonate (ZEGERID) 40-1100 MG per capsule Take 1 capsule by mouth daily before breakfast.     . pregabalin (LYRICA) 50 MG capsule Take 1 capsule (50 mg total) by mouth 3 (three) times daily. (Patient taking differently: Take 50 mg by mouth 2 (two) times daily. ) 90 capsule 4  . ramipril (ALTACE) 10 MG capsule  Take 1 capsule (10 mg total) by mouth every evening. 90 capsule 3  . valACYclovir (VALTREX) 1000 MG tablet Take 1 tablet (1,000 mg total) by mouth 2 (two) times daily as needed. For outbreaks 180 tablet 1   No current facility-administered medications for this visit.     Family History  Problem Relation Age of Onset  . Rheum arthritis Mother   . Diabetes Mother   . Heart failure Mother   . Thrombocytopenia Mother   . Sleep apnea Mother   . Ulcers Mother  PUD  . Deep vein thrombosis Father   . Pulmonary embolism Father   . Osteoarthritis Father   . Atrial fibrillation Father   . Dementia Father   . Sleep apnea Father   . Sleep apnea Brother   . Obesity Brother   . Sleep apnea Brother   . Obesity Brother   . Colon cancer Maternal Aunt 62  . Colon cancer Maternal Aunt 90  . Pancreatic cancer Unknown   . Uterine cancer Unknown   . Leukemia Unknown   . Inflammatory bowel disease Unknown        aunt  . Congenital adrenal hyperplasia Grandchild   . Atrial fibrillation Brother     ROS:  Pertinent items are noted in HPI.  Otherwise, a comprehensive ROS was negative.  Exam:   BP 130/60 (BP Location: Right Arm, Patient Position: Sitting, Cuff Size: Large)   Pulse 74   Resp 16   Ht 5\' 5"  (1.651 m)   Wt 252 lb (114.3 kg)   BMI 41.93 kg/m   Weight change: +6#   Height: 5\' 5"  (165.1 cm)  Ht Readings from Last 3 Encounters:  01/15/18 5\' 5"  (1.651 m)  09/21/17 5\' 6"  (1.676 m)  06/20/17 5\' 6"  (1.676 m)    General appearance: alert, cooperative and appears stated age Head: Normocephalic, without obvious abnormality, atraumatic Neck: no adenopathy, supple, symmetrical, trachea midline and thyroid normal to inspection and palpation Lungs: clear to auscultation bilaterally Breasts: normal appearance, no masses or tenderness Heart: regular rate and rhythm Abdomen: soft, non-tender; bowel sounds normal; no masses,  no organomegaly Extremities: extremities normal, atraumatic,  no cyanosis or edema Skin: Skin color, texture, turgor normal. No rashes or lesions Lymph nodes: Cervical, supraclavicular, and axillary nodes normal. No abnormal inguinal nodes palpated Neurologic: Grossly normal   Pelvic: External genitalia:  no lesions              Urethra:  normal appearing urethra with no masses, tenderness or lesions              Bartholins and Skenes: normal                 Vagina: normal appearing vagina with normal color and discharge, no lesions              Cervix: absent              Pap taken: No. Bimanual Exam:  Uterus:  uterus absent              Adnexa: no mass, fullness, tenderness               Rectovaginal: Confirms               Anus:  normal sphincter tone, no lesions  Chaperone was present for exam.  A:  Well Woman with normal exam PMP, on HRT (low doses, declines stopping HRT) H/O colononic polyps OSA GERD Oral HSV  P:   Mammogram guidelines reviewed.  Doing yearly.   pap smear not indicated due to TAH/BSO 1993 Estradiol 0.5mg  1/2 tab daily  #45/4RF RF for Valtrex 1mg , 2 tabs po x 2 doses.  #30/1RF. Lab work UTD with Dr. Regis Bill D/w pt Shingrix vaccines.  Will consider getting this. return annually or prn

## 2018-02-02 NOTE — Progress Notes (Signed)
Chief Complaint  Patient presents with  . Annual Exam    No new concerns  . Medication Management  . Arthritis    HPI: Terri Rodriguez 72 y.o. comes in today for  Chronic disease management and exam since last visit no major change in health. Sees cardiology on medication for vasoconstriction red nodes lipids.  Had an atypical chest pain with a normal cath not recurrent. Osteoarthritis is the most problematic hands become deformed harder to play her piano but still taking music lessons Musculoskeletal causes of the limiting factor of her physical exercise including her back.  Hip knee. Mood she states is about the same and stable. Under treatment for sleep apnea. Saw rheumatologist  No that helpful  fo rintervnetion Felt to be osteoarthritis.   HH of 2  Pets no  Sleep 7- 8  Tobacco  etoh  2-3 per  Night .  1 .     Health Maintenance  Topic Date Due  . FOOT EXAM  01/30/1956  . HEMOGLOBIN A1C  01/29/2017  . OPHTHALMOLOGY EXAM  12/12/2018  . MAMMOGRAM  07/06/2019  . TETANUS/TDAP  01/11/2024  . COLONOSCOPY  02/06/2024  . INFLUENZA VACCINE  Completed  . DEXA SCAN  Completed  . Hepatitis C Screening  Completed  . PNA vac Low Risk Adult  Completed   Health Maintenance Review LIFESTYLE:  Exercise:  lmited byt physical problems  Tobacco/ETS:n Alcohol: 1-3 per night   Sugar beverages:n Sleep: osa Drug use: no HH:2   Hearing: ok  Vision:  No limitations at present . Last eye check UTD  Safety:  Has smoke detector and wears seat belts.  No firearms. No excess sun exposure. Sees dentist regularly.  Falls: no  Advance directive :  Reviewed  .  Memory: Felt to be good  , no concern from her or her family.  Depression: No anhedonia unusual crying  Nutrition: Eats well balanced diet; adequate calcium and vitamin D. No swallowing chewing problems.  Injury: no major injuries in the last six months.  Other healthcare providers:  Reviewed today .  Social:  Lives with  spouse married.  [place] child amelia   Preventive parameters: up-to-date  Reviewed  ADLS:   There are no problems or need for assistance  driving, feeding, obtaining food, dressing, toileting and bathing, managing money using phone. She is independent.  MS causes limitation   ROS:  GEN/ HEENT: No fever, significant weight changes sweats headaches vision problems hearing changes, CV/ PULM; No chest pain shortness of breath cough, syncope,edema  change in exercise tolerance. GI /GU: No adominal pain, vomiting, change in bowel habits. No blood in the stool. No significant GU symptoms. SKIN/HEME: ,no acute skin rashes suspicious lesions or bleeding. No lymphadenopathy, nodules, masses.  NEURO/ PSYCH:  No neurologic signs such as weakness numbness.  IMM/ Allergy: No unusual infections.  Allergy .   REST of 12 system review negative except as per HPI   Past Medical History:  Diagnosis Date  . ALLERGIC RHINITIS   . Anginal pain ([place])   . Cholelithiasis 06/09/08  . Complication of anesthesia 2012   went to ICU after bilateral knees ,? unstable vital signs  . Diverticulitis   . EPICONDYLITIS, LATERAL    osteoarthritis, previous joint replacements  . GERD   . Hepatic hemangioma 06/09/08  . Hiatal hernia 03/2000  . Hx of adenomatous polyp of colon 06/05/08  . Hx of cardiac catheterization 2008    clean coronarys DrKelly   .  Hx of chest pain    neg cath remot hx of narrowwing lad in 2003 nl 2008, none in many years  . HYPERLIPIDEMIA   . IBS (irritable bowel syndrome)   . LEUKOPENIA, CHRONIC   . Mononucleosis 1963  . Nasal injury 06/14/2012  . RAYNAUD'S SYNDROME, HX OF    improved after cardiac meds initiated  . Rosacea    facial  . SLEEP APNEA, OBSTRUCTIVE    cpap, settings "automatic" settings 11-12  . Subacute thyroiditis   . UNSPECIFIED ANEMIA     Family History  Problem Relation Age of Onset  . Rheum arthritis Mother   . Diabetes Mother   . Heart failure Mother   .  Thrombocytopenia Mother   . Sleep apnea Mother   . Ulcers Mother        PUD  . Deep vein thrombosis Father   . Pulmonary embolism Father   . Osteoarthritis Father   . Atrial fibrillation Father   . Dementia Father   . Sleep apnea Father   . Sleep apnea Brother   . Obesity Brother   . Sleep apnea Brother   . Obesity Brother   . Colon cancer Maternal Aunt 9  . Colon cancer Maternal Aunt 90  . Pancreatic cancer Unknown   . Uterine cancer Unknown   . Leukemia Unknown   . Inflammatory bowel disease Unknown        aunt  . Congenital adrenal hyperplasia Grandchild   . Atrial fibrillation Brother     Social History   Socioeconomic History  . Marital status: Married    Spouse name: None  . Number of children: 1  . Years of education: None  . Highest education level: None  Social Needs  . Financial resource strain: None  . Food insecurity - worry: None  . Food insecurity - inability: None  . Transportation needs - medical: None  . Transportation needs - non-medical: None  Occupational History  . Occupation: Physician  Tobacco Use  . Smoking status: Never Smoker  . Smokeless tobacco: Never Used  Substance and Sexual Activity  . Alcohol use: Yes    Alcohol/week: 8.4 oz    Types: 14 Glasses of wine per week    Comment: 1 glass nightly  . Drug use: No  . Sexual activity: Yes    Partners: Male    Birth control/protection: Surgical    Comment: TAH/BSO  Other Topics Concern  . 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    Outpatient Encounter Medications as of 02/05/2018  Medication Sig  . acetaminophen (TYLENOL) 500 MG tablet Take 1,000 mg by mouth every 6 (six) hours as needed for mild pain or headache.   . Alum Hydroxide-Mag Carbonate (GAVISCON PO) Take 1 tablet by mouth 4 (four) times daily as needed (heart burn).   Marland Kitchen amLODipine (NORVASC) 5 MG tablet Take 1 tablet (5  mg total) by mouth daily.  Marland Kitchen amoxicillin (AMOXIL) 500 MG capsule 2,000 mg. 1 hour prior to dental procedures.  Marland Kitchen aspirin 81 MG tablet Take 81 mg by mouth daily.  Marland Kitchen atorvastatin (LIPITOR) 20 MG tablet Take 1 tablet (20 mg total) by mouth every evening.  . Azelaic Acid (FINACEA) 15 % cream Apply 1 application topically 2 (two) times daily. After skin is thoroughly washed and patted dry, gently but thoroughly massage a thin  film of azelaic acid cream into the affected area twice daily, in the morning and evening.  . carvedilol (COREG) 3.125 MG tablet Take 1 tablet (3.125 mg total) by mouth 2 (two) times daily with a meal.  . celecoxib (CELEBREX) 200 MG capsule TAKE 1 CAPSULE BY MOUTH DAILY  . Cholecalciferol (VITAMIN D3) 2000 UNITS TABS Take 1 tablet by mouth daily.  . clindamycin (CLEOCIN T) 1 % external solution 2 (two) times daily as needed.  . clobetasol ointment (TEMOVATE) 0.05 % Apply AA of skin BID  . diclofenac sodium (VOLTAREN) 1 % GEL Apply 2-4 g topically 4 (four) times daily. As needed for  Arthritis  Pain.  . DULoxetine (CYMBALTA) 60 MG capsule TAKE 1 CAPSULE BY MOUTH EVERY EVENING  . estradiol (ESTRACE) 0.5 MG tablet Take 1 tablet (0.5 mg total) by mouth daily.  . fexofenadine (ALLEGRA) 180 MG tablet Take 180 mg by mouth daily. Takes in PM  . fluocinonide (LIDEX) 0.05 % external solution Apply AA on scalp PRN  . metroNIDAZOLE (METROGEL) 1 % gel Apply 1 application topically daily. Apply to face  . Multiple Vitamins-Minerals (CENTRUM ADULTS PO) Take 1 tablet by mouth daily.  . nitroGLYCERIN (NITROSTAT) 0.4 MG SL tablet PLACE 1 TABLET UNDER THE TONGUE EVERY 5 MINUTES AS NEEDED FOR CHEST PAIN  . omeprazole-sodium bicarbonate (ZEGERID) 40-1100 MG per capsule Take 1 capsule by mouth daily before breakfast.   . pregabalin (LYRICA) 50 MG capsule Take 1 capsule (50 mg total) by mouth 3 (three) times daily. (Patient taking differently: Take 50 mg by mouth 2 (two) times daily. )  . ramipril  (ALTACE) 10 MG capsule Take 1 capsule (10 mg total) by mouth every evening.  . valACYclovir (VALTREX) 1000 MG tablet Take 2 grams po x 2 doses with fever blisters   No facility-administered encounter medications on file as of 02/05/2018.     EXAM:  BP 118/60 (BP Location: Right Arm, Patient Position: Sitting, Cuff Size: Large)   Pulse 78   Temp 97.7 F (36.5 C) (Oral)   Ht 5' 5.25" (1.657 m)   Wt 250 lb 14.4 oz (113.8 kg)   BMI 41.43 kg/m   Body mass index is 41.43 kg/m.  Physical Exam: Vital signs reviewed TKW:IOXB is a well-developed well-nourished alert cooperative   who appears stated age or younger  in no acute distress.  HEENT: normocephalic atraumatic , Eyes: PERRL EOM's full, conjunctiva clear, Nares: paten,t no deformity discharge or tenderness., Ears: no deformity EAC's clear TMs with normal landmarks. Mouth: clear OP, no lesions, edema.  Moist mucous membranes. Dentition in adequate repair. NECK: supple without masses, thyromegaly or bruits. CHEST/PULM:  Clear to auscultation and percussion breath sounds equal no wheeze , rales or rhonchi. No chest wall deformities or tenderness.Breast: normal by inspection . No dimpling, discharge, masses, tenderness or discharge . CV: PMI is nondisplaced, S1 S2 no gallops, murmurs, rubs. Peripheral pulses are full without delay.No JVD .  ABDOMEN: Bowel sounds normal nontender  No guard or rebound, no hepato splenomegal no CVA tenderness.   Extremtities:  No clubbing cyanosis or edema,  Knee and hip surgery no acute finding   Hands  Sig deformity  djd  NEURO:  Oriented x3, cranial nerves 3-12 appear to be intact, no obvious focal weakness,gait within normal limits SKIN: No acute rashes normal turgor, color, no bruising or petechiae. PSYCH: Oriented, good eye contact, no obvious depression anxiety, cognition and judgment appear normal. LN: no cervical axillary inguinal adenopathy No noted deficits  in memory, attention, and speech.   Lab  Results  Component Value Date   WBC 3.7 (L) 01/25/2017   HGB 13.2 01/25/2017   HCT 38.0 01/25/2017   PLT 218.0 01/25/2017   GLUCOSE 93 01/25/2017   CHOL 165 01/25/2017   TRIG 80.0 01/25/2017   HDL 76.10 01/25/2017   LDLCALC 73 01/25/2017   ALT 33 04/26/2017   AST 42 (H) 04/26/2017   NA 140 01/25/2017   K 4.2 01/25/2017   CL 106 01/25/2017   CREATININE 0.60 01/25/2017   BUN 20 01/25/2017   CO2 27 01/25/2017   TSH 1.58 01/25/2017   INR 0.97 07/10/2014   HGBA1C 5.3 07/29/2016    ASSESSMENT AND PLAN:  Discussed the following assessment and plan:  Primary osteoarthritis involving multiple joints - Plan: Basic metabolic panel, CBC with Differential/Platelet, Hemoglobin A1c, Hepatic function panel, Lipid panel  Medication management - Plan: Basic metabolic panel, CBC with Differential/Platelet, Hemoglobin A1c, Hepatic function panel, Lipid panel  Arthritis - Plan: Basic metabolic panel, CBC with Differential/Platelet, Hemoglobin A1c, Hepatic function panel, Lipid panel  Hyperlipidemia, unspecified hyperlipidemia type - Plan: Basic metabolic panel, CBC with Differential/Platelet, Hemoglobin A1c, Hepatic function panel, Lipid panel  Raynaud's phenomenon without gangrene - Plan: Basic metabolic panel, CBC with Differential/Platelet, Hemoglobin A1c, Hepatic function panel, Lipid panel  Hyperglycemia - Plan: Basic metabolic panel, CBC with Differential/Platelet, Hemoglobin A1c, Hepatic function panel, Lipid panel  Obstructive sleep apnea  Other chronic pain ,lab monitoring  Stable  On meds   Disc   options of help   Patient Care Team: Adna Nofziger, Standley Brooking, MD as PCP - General Troy Sine, MD (Cardiology) Jovita Gamma, MD (Neurosurgery) Megan Salon, MD as Attending Physician (Obstetrics and Gynecology) Gaynelle Arabian, MD as Attending Physician (Orthopedic Surgery) Deneise Lever, MD (Pulmonary Disease) Bo Merino, MD as Consulting Physician (Rheumatology) Jari Pigg, MD as Consulting Physician (Dermatology) Calvert Cantor, MD as Consulting Physician (Ophthalmology) Iran Planas, MD as Consulting Physician (Orthopedic Surgery)  Patient Instructions  Keep thinking about   Water  Exercise again.  As discussed .    Will notify you  of labs when available.    Health Maintenance, Female Adopting a healthy lifestyle and getting preventive care can go a long way to promote health and wellness. Talk with your health care provider about what schedule of regular examinations is right for you. This is a good chance for you to check in with your provider about disease prevention and staying healthy. In between checkups, there are plenty of things you can do on your own. Experts have done a lot of research about which lifestyle changes and preventive measures are most likely to keep you healthy. Ask your health care provider for more information. Weight and diet Eat a healthy diet  Be sure to include plenty of vegetables, fruits, low-fat dairy products, and lean protein.  Do not eat a lot of foods high in solid fats, added sugars, or salt.  Get regular exercise. This is one of the most important things you can do for your health. ? Most adults should exercise for at least 150 minutes each week. The exercise should increase your heart rate and make you sweat (moderate-intensity exercise). ? Most adults should also do strengthening exercises at least twice a week. This is in addition to the moderate-intensity exercise.  Maintain a healthy weight  Body mass index (BMI) is a measurement that can be used to identify possible weight problems. It estimates body fat based  on height and weight. Your health care provider can help determine your BMI and help you achieve or maintain a healthy weight.  For females 70 years of age and older: ? A BMI below 18.5 is considered underweight. ? A BMI of 18.5 to 24.9 is normal. ? A BMI of 25 to 29.9 is considered  overweight. ? A BMI of 30 and above is considered obese.  Watch levels of cholesterol and blood lipids  You should start having your blood tested for lipids and cholesterol at 73 years of age, then have this test every 5 years.  You may need to have your cholesterol levels checked more often if: ? Your lipid or cholesterol levels are high. ? You are older than 73 years of age. ? You are at high risk for heart disease.  Cancer screening Lung Cancer  Lung cancer screening is recommended for adults 24-36 years old who are at high risk for lung cancer because of a history of smoking.  A yearly low-dose CT scan of the lungs is recommended for people who: ? Currently smoke. ? Have quit within the past 15 years. ? Have at least a 30-pack-year history of smoking. A pack year is smoking an average of one pack of cigarettes a day for 1 year.  Yearly screening should continue until it has been 15 years since you quit.  Yearly screening should stop if you develop a health problem that would prevent you from having lung cancer treatment.  Breast Cancer  Practice breast self-awareness. This means understanding how your breasts normally appear and feel.  It also means doing regular breast self-exams. Let your health care provider know about any changes, no matter how small.  If you are in your 20s or 30s, you should have a clinical breast exam (CBE) by a health care provider every 1-3 years as part of a regular health exam.  If you are 2 or older, have a CBE every year. Also consider having a breast X-ray (mammogram) every year.  If you have a family history of breast cancer, talk to your health care provider about genetic screening.  If you are at high risk for breast cancer, talk to your health care provider about having an MRI and a mammogram every year.  Breast cancer gene (BRCA) assessment is recommended for women who have family members with BRCA-related cancers. BRCA-related cancers  include: ? Breast. ? Ovarian. ? Tubal. ? Peritoneal cancers.  Results of the assessment will determine the need for genetic counseling and BRCA1 and BRCA2 testing.  Cervical Cancer Your health care provider may recommend that you be screened regularly for cancer of the pelvic organs (ovaries, uterus, and vagina). This screening involves a pelvic examination, including checking for microscopic changes to the surface of your cervix (Pap test). You may be encouraged to have this screening done every 3 years, beginning at age 73.  For women ages 3-65, health care providers may recommend pelvic exams and Pap testing every 3 years, or they may recommend the Pap and pelvic exam, combined with testing for human papilloma virus (HPV), every 5 years. Some types of HPV increase your risk of cervical cancer. Testing for HPV may also be done on women of any age with unclear Pap test results.  Other health care providers may not recommend any screening for nonpregnant women who are considered low risk for pelvic cancer and who do not have symptoms. Ask your health care provider if a screening pelvic exam is right  for you.  If you have had past treatment for cervical cancer or a condition that could lead to cancer, you need Pap tests and screening for cancer for at least 20 years after your treatment. If Pap tests have been discontinued, your risk factors (such as having a new sexual partner) need to be reassessed to determine if screening should resume. Some women have medical problems that increase the chance of getting cervical cancer. In these cases, your health care provider may recommend more frequent screening and Pap tests.  Colorectal Cancer  This type of cancer can be detected and often prevented.  Routine colorectal cancer screening usually begins at 73 years of age and continues through 73 years of age.  Your health care provider may recommend screening at an earlier age if you have risk factors  for colon cancer.  Your health care provider may also recommend using home test kits to check for hidden blood in the stool.  A small camera at the end of a tube can be used to examine your colon directly (sigmoidoscopy or colonoscopy). This is done to check for the earliest forms of colorectal cancer.  Routine screening usually begins at age 27.  Direct examination of the colon should be repeated every 5-10 years through 73 years of age. However, you may need to be screened more often if early forms of precancerous polyps or small growths are found.  Skin Cancer  Check your skin from head to toe regularly.  Tell your health care provider about any new moles or changes in moles, especially if there is a change in a mole's shape or color.  Also tell your health care provider if you have a mole that is larger than the size of a pencil eraser.  Always use sunscreen. Apply sunscreen liberally and repeatedly throughout the day.  Protect yourself by wearing long sleeves, pants, a wide-brimmed hat, and sunglasses whenever you are outside.  Heart disease, diabetes, and high blood pressure  High blood pressure causes heart disease and increases the risk of stroke. High blood pressure is more likely to develop in: ? People who have blood pressure in the high end of the normal range (130-139/85-89 mm Hg). ? People who are overweight or obese. ? People who are African American.  If you are 79-20 years of age, have your blood pressure checked every 3-5 years. If you are 50 years of age or older, have your blood pressure checked every year. You should have your blood pressure measured twice-once when you are at a hospital or clinic, and once when you are not at a hospital or clinic. Record the average of the two measurements. To check your blood pressure when you are not at a hospital or clinic, you can use: ? An automated blood pressure machine at a pharmacy. ? A home blood pressure monitor.  If  you are between 46 years and 58 years old, ask your health care provider if you should take aspirin to prevent strokes.  Have regular diabetes screenings. This involves taking a blood sample to check your fasting blood sugar level. ? If you are at a normal weight and have a low risk for diabetes, have this test once every three years after 73 years of age. ? If you are overweight and have a high risk for diabetes, consider being tested at a younger age or more often. Preventing infection Hepatitis B  If you have a higher risk for hepatitis B, you should be screened for  this virus. You are considered at high risk for hepatitis B if: ? You were born in a country where hepatitis B is common. Ask your health care provider which countries are considered high risk. ? Your parents were born in a high-risk country, and you have not been immunized against hepatitis B (hepatitis B vaccine). ? You have HIV or AIDS. ? You use needles to inject street drugs. ? You live with someone who has hepatitis B. ? You have had sex with someone who has hepatitis B. ? You get hemodialysis treatment. ? You take certain medicines for conditions, including cancer, organ transplantation, and autoimmune conditions.  Hepatitis C  Blood testing is recommended for: ? Everyone born from [birth year] through 1966. ? Anyone with known risk factors for hepatitis C.  Sexually transmitted infections (STIs)  You should be screened for sexually transmitted infections (STIs) including gonorrhea and chlamydia if: ? You are sexually active and are younger than 74 years of age. ? You are older than 73 years of age and your health care provider tells you that you are at risk for this type of infection. ? Your sexual activity has changed since you were last screened and you are at an increased risk for chlamydia or gonorrhea. Ask your health care provider if you are at risk.  If you do not have HIV, but are at risk, it may be recommended  that you take a prescription medicine daily to prevent HIV infection. This is called pre-exposure prophylaxis (PrEP). You are considered at risk if: ? You are sexually active and do not regularly use condoms or know the HIV status of your partner(s). ? You take drugs by injection. ? You are sexually active with a partner who has HIV.  Talk with your health care provider about whether you are at high risk of being infected with HIV. If you choose to begin PrEP, you should first be tested for HIV. You should then be tested every 3 months for as long as you are taking PrEP. Pregnancy  If you are premenopausal and you may become pregnant, ask your health care provider about preconception counseling.  If you may become pregnant, take 400 to 800 micrograms (mcg) of folic acid every day.  If you want to prevent pregnancy, talk to your health care provider about birth control (contraception). Osteoporosis and menopause  Osteoporosis is a disease in which the bones lose minerals and strength with aging. This can result in serious bone fractures. Your risk for osteoporosis can be identified using a bone density scan.  If you are 37 years of age or older, or if you are at risk for osteoporosis and fractures, ask your health care provider if you should be screened.  Ask your health care provider whether you should take a calcium or vitamin D supplement to lower your risk for osteoporosis.  Menopause may have certain physical symptoms and risks.  Hormone replacement therapy may reduce some of these symptoms and risks. Talk to your health care provider about whether hormone replacement therapy is right for you. Follow these instructions at home:  Schedule regular health, dental, and eye exams.  Stay current with your immunizations.  Do not use any tobacco products including cigarettes, chewing tobacco, or electronic cigarettes.  If you are pregnant, do not drink alcohol.  If you are  breastfeeding, limit how much and how often you drink alcohol.  Limit alcohol intake to no more than 1 drink per day for nonpregnant women. One drink  equals 12 ounces of beer, 5 ounces of wine, or 1 ounces of hard liquor.  Do not use street drugs.  Do not share needles.  Ask your health care provider for help if you need support or information about quitting drugs.  Tell your health care provider if you often feel depressed.  Tell your health care provider if you have ever been abused or do not feel safe at home. This information is not intended to replace advice given to you by your health care provider. Make sure you discuss any questions you have with your health care provider. Document Released: 06/06/2011 Document Revised: 04/28/2016 Document Reviewed: 08/25/2015 Elsevier Interactive Patient Education  2018 [place]. Maurine Mowbray M.D.

## 2018-02-05 ENCOUNTER — Ambulatory Visit (INDEPENDENT_AMBULATORY_CARE_PROVIDER_SITE_OTHER): Payer: Medicare Other | Admitting: Internal Medicine

## 2018-02-05 ENCOUNTER — Encounter: Payer: Self-pay | Admitting: Internal Medicine

## 2018-02-05 VITALS — BP 118/60 | HR 78 | Temp 97.7°F | Ht 65.25 in | Wt 250.9 lb

## 2018-02-05 DIAGNOSIS — E785 Hyperlipidemia, unspecified: Secondary | ICD-10-CM | POA: Diagnosis not present

## 2018-02-05 DIAGNOSIS — I7300 Raynaud's syndrome without gangrene: Secondary | ICD-10-CM

## 2018-02-05 DIAGNOSIS — Z79899 Other long term (current) drug therapy: Secondary | ICD-10-CM | POA: Diagnosis not present

## 2018-02-05 DIAGNOSIS — M150 Primary generalized (osteo)arthritis: Secondary | ICD-10-CM | POA: Diagnosis not present

## 2018-02-05 DIAGNOSIS — M1990 Unspecified osteoarthritis, unspecified site: Secondary | ICD-10-CM | POA: Diagnosis not present

## 2018-02-05 DIAGNOSIS — G4733 Obstructive sleep apnea (adult) (pediatric): Secondary | ICD-10-CM | POA: Diagnosis not present

## 2018-02-05 DIAGNOSIS — I2510 Atherosclerotic heart disease of native coronary artery without angina pectoris: Secondary | ICD-10-CM

## 2018-02-05 DIAGNOSIS — M159 Polyosteoarthritis, unspecified: Secondary | ICD-10-CM

## 2018-02-05 DIAGNOSIS — R739 Hyperglycemia, unspecified: Secondary | ICD-10-CM

## 2018-02-05 DIAGNOSIS — G8929 Other chronic pain: Secondary | ICD-10-CM | POA: Diagnosis not present

## 2018-02-05 LAB — LIPID PANEL
Cholesterol: 160 mg/dL (ref 0–200)
HDL: 88 mg/dL (ref >39.00)
LDL Cholesterol: 57 mg/dL (ref 0–99)
NonHDL: 71.94
Total CHOL/HDL Ratio: 2
Triglycerides: 75 mg/dL (ref 0.0–149.0)
VLDL: 15 mg/dL (ref 0.0–40.0)

## 2018-02-05 LAB — CBC WITH DIFFERENTIAL/PLATELET
Basophils Absolute: 0 10*3/uL (ref 0.0–0.1)
Basophils Relative: 1 % (ref 0.0–3.0)
Eosinophils Absolute: 0.1 10*3/uL (ref 0.0–0.7)
Eosinophils Relative: 4.3 % (ref 0.0–5.0)
HCT: 37.7 % (ref 36.0–46.0)
Hemoglobin: 13 g/dL (ref 12.0–15.0)
Lymphocytes Relative: 35 % (ref 12.0–46.0)
Lymphs Abs: 1.2 10*3/uL (ref 0.7–4.0)
MCHC: 34.4 g/dL (ref 30.0–36.0)
MCV: 97.3 fl (ref 78.0–100.0)
Monocytes Absolute: 0.3 10*3/uL (ref 0.1–1.0)
Monocytes Relative: 7.5 % (ref 3.0–12.0)
Neutro Abs: 1.8 10*3/uL (ref 1.4–7.7)
Neutrophils Relative %: 52.2 % (ref 43.0–77.0)
Platelets: 228 10*3/uL (ref 150.0–400.0)
RBC: 3.88 Mil/uL (ref 3.87–5.11)
RDW: 14.3 % (ref 11.5–15.5)
WBC: 3.4 10*3/uL — ABNORMAL LOW (ref 4.0–10.5)

## 2018-02-05 LAB — BASIC METABOLIC PANEL
BUN: 21 mg/dL (ref 6–23)
CO2: 30 mEq/L (ref 19–32)
Calcium: 9.4 mg/dL (ref 8.4–10.5)
Chloride: 103 mEq/L (ref 96–112)
Creatinine, Ser: 0.71 mg/dL (ref 0.40–1.20)
GFR: 85.76 mL/min (ref >60.00)
Glucose, Bld: 102 mg/dL — ABNORMAL HIGH (ref 70–99)
Potassium: 4.6 mEq/L (ref 3.5–5.1)
Sodium: 140 mEq/L (ref 135–145)

## 2018-02-05 LAB — HEPATIC FUNCTION PANEL
ALT: 25 U/L (ref 0–35)
AST: 37 U/L (ref 0–37)
Albumin: 4.1 g/dL (ref 3.5–5.2)
Alkaline Phosphatase: 79 U/L (ref 39–117)
Bilirubin, Direct: 0.1 mg/dL (ref 0.0–0.3)
Total Bilirubin: 0.5 mg/dL (ref 0.2–1.2)
Total Protein: 6.6 g/dL (ref 6.0–8.3)

## 2018-02-05 LAB — HEMOGLOBIN A1C: Hgb A1c MFr Bld: 5.4 % (ref 4.6–6.5)

## 2018-02-05 NOTE — Patient Instructions (Addendum)
Keep thinking about   Water  Exercise again.  As discussed .    Will notify you  of labs when available.    Health Maintenance, Female Adopting a healthy lifestyle and getting preventive care can go a long way to promote health and wellness. Talk with your health care provider about what schedule of regular examinations is right for you. This is a good chance for you to check in with your provider about disease prevention and staying healthy. In between checkups, there are plenty of things you can do on your own. Experts have done a lot of research about which lifestyle changes and preventive measures are most likely to keep you healthy. Ask your health care provider for more information. Weight and diet Eat a healthy diet  Be sure to include plenty of vegetables, fruits, low-fat dairy products, and lean protein.  Do not eat a lot of foods high in solid fats, added sugars, or salt.  Get regular exercise. This is one of the most important things you can do for your health. ? Most adults should exercise for at least 150 minutes each week. The exercise should increase your heart rate and make you sweat (moderate-intensity exercise). ? Most adults should also do strengthening exercises at least twice a week. This is in addition to the moderate-intensity exercise.  Maintain a healthy weight  Body mass index (BMI) is a measurement that can be used to identify possible weight problems. It estimates body fat based on height and weight. Your health care provider can help determine your BMI and help you achieve or maintain a healthy weight.  For females 16 years of age and older: ? A BMI below 18.5 is considered underweight. ? A BMI of 18.5 to 24.9 is normal. ? A BMI of 25 to 29.9 is considered overweight. ? A BMI of 30 and above is considered obese.  Watch levels of cholesterol and blood lipids  You should start having your blood tested for lipids and cholesterol at 73 years of age, then have  this test every 5 years.  You may need to have your cholesterol levels checked more often if: ? Your lipid or cholesterol levels are high. ? You are older than 73 years of age. ? You are at high risk for heart disease.  Cancer screening Lung Cancer  Lung cancer screening is recommended for adults 56-63 years old who are at high risk for lung cancer because of a history of smoking.  A yearly low-dose CT scan of the lungs is recommended for people who: ? Currently smoke. ? Have quit within the past 15 years. ? Have at least a 30-pack-year history of smoking. A pack year is smoking an average of one pack of cigarettes a day for 1 year.  Yearly screening should continue until it has been 15 years since you quit.  Yearly screening should stop if you develop a health problem that would prevent you from having lung cancer treatment.  Breast Cancer  Practice breast self-awareness. This means understanding how your breasts normally appear and feel.  It also means doing regular breast self-exams. Let your health care provider know about any changes, no matter how small.  If you are in your 20s or 30s, you should have a clinical breast exam (CBE) by a health care provider every 1-3 years as part of a regular health exam.  If you are 35 or older, have a CBE every year. Also consider having a breast X-ray (mammogram) every  year.  If you have a family history of breast cancer, talk to your health care provider about genetic screening.  If you are at high risk for breast cancer, talk to your health care provider about having an MRI and a mammogram every year.  Breast cancer gene (BRCA) assessment is recommended for women who have family members with BRCA-related cancers. BRCA-related cancers include: ? Breast. ? Ovarian. ? Tubal. ? Peritoneal cancers.  Results of the assessment will determine the need for genetic counseling and BRCA1 and BRCA2 testing.  Cervical Cancer Your health care  provider may recommend that you be screened regularly for cancer of the pelvic organs (ovaries, uterus, and vagina). This screening involves a pelvic examination, including checking for microscopic changes to the surface of your cervix (Pap test). You may be encouraged to have this screening done every 3 years, beginning at age 53.  For women ages 83-65, health care providers may recommend pelvic exams and Pap testing every 3 years, or they may recommend the Pap and pelvic exam, combined with testing for human papilloma virus (HPV), every 5 years. Some types of HPV increase your risk of cervical cancer. Testing for HPV may also be done on women of any age with unclear Pap test results.  Other health care providers may not recommend any screening for nonpregnant women who are considered low risk for pelvic cancer and who do not have symptoms. Ask your health care provider if a screening pelvic exam is right for you.  If you have had past treatment for cervical cancer or a condition that could lead to cancer, you need Pap tests and screening for cancer for at least 20 years after your treatment. If Pap tests have been discontinued, your risk factors (such as having a new sexual partner) need to be reassessed to determine if screening should resume. Some women have medical problems that increase the chance of getting cervical cancer. In these cases, your health care provider may recommend more frequent screening and Pap tests.  Colorectal Cancer  This type of cancer can be detected and often prevented.  Routine colorectal cancer screening usually begins at 73 years of age and continues through 73 years of age.  Your health care provider may recommend screening at an earlier age if you have risk factors for colon cancer.  Your health care provider may also recommend using home test kits to check for hidden blood in the stool.  A small camera at the end of a tube can be used to examine your colon  directly (sigmoidoscopy or colonoscopy). This is done to check for the earliest forms of colorectal cancer.  Routine screening usually begins at age 38.  Direct examination of the colon should be repeated every 5-10 years through 73 years of age. However, you may need to be screened more often if early forms of precancerous polyps or small growths are found.  Skin Cancer  Check your skin from head to toe regularly.  Tell your health care provider about any new moles or changes in moles, especially if there is a change in a mole's shape or color.  Also tell your health care provider if you have a mole that is larger than the size of a pencil eraser.  Always use sunscreen. Apply sunscreen liberally and repeatedly throughout the day.  Protect yourself by wearing long sleeves, pants, a wide-brimmed hat, and sunglasses whenever you are outside.  Heart disease, diabetes, and high blood pressure  High blood pressure causes heart  disease and increases the risk of stroke. High blood pressure is more likely to develop in: ? People who have blood pressure in the high end of the normal range (130-139/85-89 mm Hg). ? People who are overweight or obese. ? People who are African American.  If you are 77-49 years of age, have your blood pressure checked every 3-5 years. If you are 62 years of age or older, have your blood pressure checked every year. You should have your blood pressure measured twice-once when you are at a hospital or clinic, and once when you are not at a hospital or clinic. Record the average of the two measurements. To check your blood pressure when you are not at a hospital or clinic, you can use: ? An automated blood pressure machine at a pharmacy. ? A home blood pressure monitor.  If you are between 14 years and 65 years old, ask your health care provider if you should take aspirin to prevent strokes.  Have regular diabetes screenings. This involves taking a blood sample to  check your fasting blood sugar level. ? If you are at a normal weight and have a low risk for diabetes, have this test once every three years after 73 years of age. ? If you are overweight and have a high risk for diabetes, consider being tested at a younger age or more often. Preventing infection Hepatitis B  If you have a higher risk for hepatitis B, you should be screened for this virus. You are considered at high risk for hepatitis B if: ? You were born in a country where hepatitis B is common. Ask your health care provider which countries are considered high risk. ? Your parents were born in a high-risk country, and you have not been immunized against hepatitis B (hepatitis B vaccine). ? You have HIV or AIDS. ? You use needles to inject street drugs. ? You live with someone who has hepatitis B. ? You have had sex with someone who has hepatitis B. ? You get hemodialysis treatment. ? You take certain medicines for conditions, including cancer, organ transplantation, and autoimmune conditions.  Hepatitis C  Blood testing is recommended for: ? Everyone born from [birth year] through 1966. ? Anyone with known risk factors for hepatitis C.  Sexually transmitted infections (STIs)  You should be screened for sexually transmitted infections (STIs) including gonorrhea and chlamydia if: ? You are sexually active and are younger than 73 years of age. ? You are older than 73 years of age and your health care provider tells you that you are at risk for this type of infection. ? Your sexual activity has changed since you were last screened and you are at an increased risk for chlamydia or gonorrhea. Ask your health care provider if you are at risk.  If you do not have HIV, but are at risk, it may be recommended that you take a prescription medicine daily to prevent HIV infection. This is called pre-exposure prophylaxis (PrEP). You are considered at risk if: ? You are sexually active and do not regularly  use condoms or know the HIV status of your partner(s). ? You take drugs by injection. ? You are sexually active with a partner who has HIV.  Talk with your health care provider about whether you are at high risk of being infected with HIV. If you choose to begin PrEP, you should first be tested for HIV. You should then be tested every 3 months for as long as you  are taking PrEP. Pregnancy  If you are premenopausal and you may become pregnant, ask your health care provider about preconception counseling.  If you may become pregnant, take 400 to 800 micrograms (mcg) of folic acid every day.  If you want to prevent pregnancy, talk to your health care provider about birth control (contraception). Osteoporosis and menopause  Osteoporosis is a disease in which the bones lose minerals and strength with aging. This can result in serious bone fractures. Your risk for osteoporosis can be identified using a bone density scan.  If you are 81 years of age or older, or if you are at risk for osteoporosis and fractures, ask your health care provider if you should be screened.  Ask your health care provider whether you should take a calcium or vitamin D supplement to lower your risk for osteoporosis.  Menopause may have certain physical symptoms and risks.  Hormone replacement therapy may reduce some of these symptoms and risks. Talk to your health care provider about whether hormone replacement therapy is right for you. Follow these instructions at home:  Schedule regular health, dental, and eye exams.  Stay current with your immunizations.  Do not use any tobacco products including cigarettes, chewing tobacco, or electronic cigarettes.  If you are pregnant, do not drink alcohol.  If you are breastfeeding, limit how much and how often you drink alcohol.  Limit alcohol intake to no more than 1 drink per day for nonpregnant women. One drink equals 12 ounces of beer, 5 ounces of wine, or 1 ounces  of hard liquor.  Do not use street drugs.  Do not share needles.  Ask your health care provider for help if you need support or information about quitting drugs.  Tell your health care provider if you often feel depressed.  Tell your health care provider if you have ever been abused or do not feel safe at home. This information is not intended to replace advice given to you by your health care provider. Make sure you discuss any questions you have with your health care provider. Document Released: 06/06/2011 Document Revised: 04/28/2016 Document Reviewed: 08/25/2015 Elsevier Interactive Patient Education  [employer].

## 2018-02-28 ENCOUNTER — Other Ambulatory Visit: Payer: Self-pay | Admitting: Cardiovascular Disease

## 2018-03-01 ENCOUNTER — Other Ambulatory Visit: Payer: Self-pay | Admitting: Cardiovascular Disease

## 2018-03-02 ENCOUNTER — Other Ambulatory Visit: Payer: Self-pay | Admitting: Cardiovascular Disease

## 2018-03-02 NOTE — Telephone Encounter (Signed)
Rx(s) sent to pharmacy electronically.  

## 2018-03-05 DEATH — deceased

## 2018-03-15 ENCOUNTER — Other Ambulatory Visit: Payer: Self-pay | Admitting: Internal Medicine

## 2018-03-21 NOTE — Telephone Encounter (Signed)
Last filled 12/29/17, #90 Last OV 02/05/18  Please advise Dr Regis Bill, thanks.

## 2018-03-21 NOTE — Telephone Encounter (Signed)
Sent electronically 

## 2018-03-22 NOTE — Telephone Encounter (Signed)
Pt notified via mychart

## 2018-03-26 ENCOUNTER — Other Ambulatory Visit: Payer: Self-pay | Admitting: Internal Medicine

## 2018-04-05 ENCOUNTER — Other Ambulatory Visit: Payer: Self-pay | Admitting: Cardiovascular Disease

## 2018-04-05 NOTE — Telephone Encounter (Signed)
REFILL 

## 2018-04-09 ENCOUNTER — Other Ambulatory Visit: Payer: Self-pay | Admitting: Cardiovascular Disease

## 2018-04-09 NOTE — Telephone Encounter (Signed)
REFILL 

## 2018-06-04 ENCOUNTER — Ambulatory Visit (INDEPENDENT_AMBULATORY_CARE_PROVIDER_SITE_OTHER): Payer: Medicare Other | Admitting: Cardiovascular Disease

## 2018-06-04 ENCOUNTER — Encounter: Payer: Self-pay | Admitting: Cardiovascular Disease

## 2018-06-04 VITALS — BP 116/62 | HR 70 | Ht 65.5 in | Wt 249.6 lb

## 2018-06-04 DIAGNOSIS — I2510 Atherosclerotic heart disease of native coronary artery without angina pectoris: Secondary | ICD-10-CM | POA: Diagnosis not present

## 2018-06-04 DIAGNOSIS — E785 Hyperlipidemia, unspecified: Secondary | ICD-10-CM | POA: Diagnosis not present

## 2018-06-04 DIAGNOSIS — I10 Essential (primary) hypertension: Secondary | ICD-10-CM | POA: Diagnosis not present

## 2018-06-04 DIAGNOSIS — G4733 Obstructive sleep apnea (adult) (pediatric): Secondary | ICD-10-CM | POA: Diagnosis not present

## 2018-06-04 DIAGNOSIS — I7300 Raynaud's syndrome without gangrene: Secondary | ICD-10-CM

## 2018-06-04 NOTE — Patient Instructions (Signed)
Medication Instructions:  Your physician recommends that you continue on your current medications as directed. Please refer to the Current Medication list given to you today.  Follow-Up: Your physician wants you to follow-up in: 1 year with Dr. Kelly.  You will receive a reminder letter in the mail two months in advance. If you don't receive a letter, please call our office to schedule the follow-up appointment.   Any Other Special Instructions Will Be Listed Below (If Applicable).     If you need a refill on your cardiac medications before your next appointment, please call your pharmacy.   

## 2018-06-04 NOTE — Progress Notes (Signed)
Patient ID: Terri Rodriguez, female   DOB: 01/28/1946, 72 y.o.   MRN: [id number]    Primary M.D.: Dr. Shanon Ace  HPI: Terri Rodriguez is a 72 y.o. female who presents to the office today for 15 month cardiology evaluation.  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 Cardiac catheterization demonstrated a 30% tubular narrowing of the proximal LAD. She has a history of probable Raynaud's with a remote history of digital ischemia/spasm. She has been aggressively treated with medical therapy both for potential coronary vasospasm as well as lipid-lowering therapy for treatment methods to optimize endothelial function and blood pressure. She does have significant arthritic issues with degenerative disc disease and has undergone surgery involving L3-L4, L4-L5 by Dr. Sherwood Gambler. She also is status post bilateral knee surgery as well as left hip replacement. She has a history of obstructive sleep apnea on CPAP therapy, history of osteoarthritis which has been progressive. There also is a history of rosacea.    She sustained a left radius fracture and required open reduction and internal fixation in April 2015.  In August 2015, she underwent right hip replacement.  Her arthritis has limited her activity.  She has a history of obstructive sleep apnea and admits to 100% CPAP use.  She is unaware of breakthrough snoring.  She denies excessive daytime sleepiness.    I last saw her in April 2019 at which time she denied any recurrent episodes of chest tightness or pressure. She denies any Raynaud's phenomenon. She was unaware of any palpitations.  She continues to have neuropathy in nerve pain issues for which she is on Cymbalta and recently was started on pregabalin with improvement.  There is no shortness of breath, chest pain, or digital vasospasm on amlodipine 5 mg, ramipril 10 mg, and carvedilol 3.125 mg twice a day.  She is on atorvastatin 20 mg. Lipid  studies in  2018 showed a total cholesterol 165, HDL 76, LDLs 73, which had risen from 59 one year previously, and triglycerides are 108.    Over the past year, Terri Rodriguez has remained stable.  She is now a grandmother of 2 girls who have congenital adrenal hypoplasia are doing well.  She continues to have arthritic issues with both hips and knees.  She denies any episodes of ray nods phenomena.  She denies any angina.  She has not been successful with weight loss.  She uses CPAP with 100% compliance with her DME company being [employer].  She presents for evaluation.  Past Medical History:  Diagnosis Date  . ALLERGIC RHINITIS   . Anginal pain ([place])   . Cholelithiasis 06/09/08  . Complication of anesthesia 2012   went to ICU after bilateral knees ,? unstable vital signs  . Diverticulitis   . EPICONDYLITIS, LATERAL    osteoarthritis, previous joint replacements  . GERD   . Hepatic hemangioma 06/09/08  . Hiatal hernia 03/2000  . Hx of adenomatous polyp of colon 06/05/08  . Hx of cardiac catheterization 2008    clean coronarys DrKelly   . Hx of chest pain    neg cath remot hx of narrowwing lad in 2003 nl 2008, none in many years  . HYPERLIPIDEMIA   . IBS (irritable bowel syndrome)   . LEUKOPENIA, CHRONIC   . Mononucleosis 1963  . Nasal injury 06/14/2012  . RAYNAUD'S SYNDROME, HX OF    improved after cardiac meds initiated  . Rosacea    facial  .  SLEEP APNEA, OBSTRUCTIVE    cpap, settings "automatic" settings 11-12  . Subacute thyroiditis   . UNSPECIFIED ANEMIA     Past Surgical History:  Procedure Laterality Date  . ABDOMINAL HYSTERECTOMY    . BACK SURGERY  02/06/2008  . CARDIAC CATHETERIZATION  04/07/2007   Noncritical coronary artery disease. Contiue medical therapy.  Marland Kitchen CARDIAC CATHETERIZATION  12/03/2007   Normal LV function. Mild angiographic mitral valve prolapse. Normal coronary arteries.  Marland Kitchen CARDIOVASCULAR STRESS TEST  11/09/2007   Moderate ischemia in Mid Anterior, Mid  Anteroseptal, Apical Anterior, and Apical Septal regions. EKG negative for ischemia.  Marland Kitchen [place]  . dental implants  11/2002,11/2011,11/2012  . DILATION AND CURETTAGE OF UTERUS    . JOINT REPLACEMENT  09/21/2011   bilateral knee  . KNEE ARTHROSCOPY     bilateral  . lumbar surgery fixation with disc replacement  10/08   Left  . NASAL SEPTOPLASTY W/ TURBINOPLASTY    . NOCTURNAL POLYSOMNOGRAM  12/31/2006   Severe obstructive sleep apnea. AHI-87/hr  . REPLACEMENT TOTAL KNEE     bilateral  . TONSILLECTOMY    . TONSILLECTOMY  1952  . TOTAL HIP ARTHROPLASTY Left 10/21/2013   Procedure: LEFT TOTAL HIP ARTHROPLASTY ANTERIOR APPROACH;  Surgeon: [name], MD;  Location: [facility];  Service: Orthopedics;  Laterality: Left;  . TOTAL HIP ARTHROPLASTY Right 07/16/2014   Procedure: RIGHT TOTAL HIP ARTHROPLASTY ANTERIOR APPROACH;  Surgeon: [name], MD;  Location: [facility];  Service: Orthopedics;  Laterality: Right;  . TRANSTHORACIC ECHOCARDIOGRAM  11/09/2007   EF 33%, LV systolic function normal. Mild aortic root dilation.  . WRIST SURGERY Left 04/03/14   ORIF "distal radial head fracture"    Allergies  Allergen Reactions  . Codeine     nausea  . Crab [Shellfish Allergy] Itching and Nausea And Vomiting    Current Outpatient Medications  Medication Sig Dispense Refill  . acetaminophen (TYLENOL) 500 MG tablet Take 1,000 mg by mouth every 6 (six) hours as needed for mild pain or headache.     . Alum Hydroxide-Mag Carbonate (GAVISCON PO) Take 1 tablet by mouth 4 (four) times daily as needed (heart burn).     Marland Kitchen amLODipine (NORVASC) 5 MG tablet Take 1 tablet (5 mg total) by mouth daily. KEEP OV. 30 tablet 2  . amoxicillin (AMOXIL) 500 MG capsule 2,000 mg. 1 hour prior to dental procedures.    Marland Kitchen aspirin 81 MG tablet Take 81 mg by mouth daily.    Marland Kitchen atorvastatin (LIPITOR) 20 MG tablet Take 1 tablet (20 mg total) by mouth daily at 6 PM. KEEP OV. 30 tablet 2  . Azelaic Acid  (FINACEA) 15 % cream Apply 1 application topically 2 (two) times daily. After skin is thoroughly washed and patted dry, gently but thoroughly massage a thin film of azelaic acid cream into the affected area twice daily, in the morning and evening.    . carvedilol (COREG) 3.125 MG tablet Take 1 tablet (3.125 mg total) by mouth 2 (two) times daily with a meal. KEEP OV. 60 tablet 2  . celecoxib (CELEBREX) 200 MG capsule TAKE 1 CAPSULE BY MOUTH DAILY 90 capsule 0  . Cholecalciferol (VITAMIN D3) 2000 UNITS TABS Take 1 tablet by mouth daily.    . clindamycin (CLEOCIN T) 1 % external solution 2 (two) times daily as needed.  3  . clobetasol ointment (TEMOVATE) 0.05 % Apply AA of skin BID  0  . diclofenac sodium (VOLTAREN) 1 %  GEL Apply 2-4 g topically 4 (four) times daily. As needed for  Arthritis  Pain. 4 Tube 1  . DULoxetine (CYMBALTA) 60 MG capsule TAKE 1 CAPSULE BY MOUTH EVERY EVENING 90 capsule 0  . estradiol (ESTRACE) 0.5 MG tablet Take 1 tablet (0.5 mg total) by mouth daily. 45 tablet 4  . fexofenadine (ALLEGRA) 180 MG tablet Take 180 mg by mouth daily. Takes in PM    . fluocinonide (LIDEX) 0.05 % external solution Apply AA on scalp PRN  0  . LYRICA 50 MG capsule TAKE ONE CAPSULE BY MOUTH THREE TIMES DAILY 90 capsule 2  . metroNIDAZOLE (METROGEL) 1 % gel Apply 1 application topically daily. Apply to face    . Multiple Vitamins-Minerals (CENTRUM ADULTS PO) Take 1 tablet by mouth daily.    . nitroGLYCERIN (NITROSTAT) 0.4 MG SL tablet PLACE 1 TABLET UNDER THE TONGUE EVERY 5 MINUTES AS NEEDED FOR CHEST PAIN 25 tablet 3  . omeprazole-sodium bicarbonate (ZEGERID) 40-1100 MG per capsule Take 1 capsule by mouth daily before breakfast.     . ramipril (ALTACE) 10 MG capsule TAKE ONE CAPSULE BY MOUTH DAILY 30 capsule 1  . valACYclovir (VALTREX) 1000 MG tablet Take 2 grams po x 2 doses with fever blisters 30 tablet 1   No current facility-administered medications for this visit.     Social History    Socioeconomic History  . Marital status: Married    Spouse name: Not on file  . Number of children: 1  . Years of education: Not on file  . Highest education level: Not on file  Occupational History  . Occupation: Physician  Social Needs  . Financial resource strain: Not on file  . Food insecurity:    Worry: Not on file    Inability: Not on file  . Transportation needs:    Medical: Not on file    Non-medical: Not on file  Tobacco Use  . Smoking status: Never Smoker  . Smokeless tobacco: Never Used  Substance and Sexual Activity  . Alcohol use: Yes    Alcohol/week: 8.4 oz    Types: 14 Glasses of wine per week    Comment: 1 glass nightly  . Drug use: No  . Sexual activity: Yes    Partners: Male    Birth control/protection: Surgical    Comment: TAH/BSO  Lifestyle  . Physical activity:    Days per week: Not on file    Minutes per session: Not on file  . Stress: Not on file  Relationships  . Social connections:    Talks on phone: Not on file    Gets together: Not on file    Attends religious service: Not on file    Active member of club or organization: Not on file    Attends meetings of clubs or organizations: Not on file    Relationship status: Not on file  . Intimate partner violence:    Fear of current or ex partner: Not on file    Emotionally abused: Not on file    Physically abused: Not on file    Forced sexual activity: Not on file  Other Topics Concern  . 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    Family History  Problem Relation Age of Onset  . Rheum arthritis Mother   . Diabetes Mother   .  Heart failure Mother   . Thrombocytopenia Mother   . Sleep apnea Mother   . Ulcers Mother        PUD  . Deep vein thrombosis Father   . Pulmonary embolism Father   . Osteoarthritis Father   . Atrial fibrillation Father   . Dementia Father   .  Sleep apnea Father   . Sleep apnea Brother   . Obesity Brother   . Sleep apnea Brother   . Obesity Brother   . Colon cancer Maternal Aunt 26  . Colon cancer Maternal Aunt 90  . Pancreatic cancer Unknown   . Uterine cancer Unknown   . Leukemia Unknown   . Inflammatory bowel disease Unknown        aunt  . Congenital adrenal hyperplasia Grandchild   . Atrial fibrillation Brother     ROS General: Negative; No fevers, chills, or night sweats; positive for weight gain  HEENT: Negative; No changes in vision or hearing, sinus congestion, difficulty swallowing Pulmonary: Negative; No cough, wheezing, shortness of breath, hemoptysis Cardiovascular: Negative; No chest pain, presyncope, syncope, palpitations GI: Positive for GERD No nausea, vomiting, diarrhea, or abdominal pain GU: Negative; No dysuria, hematuria, or difficulty voiding Musculoskeletal: Positive for lumbar disc surgery, bilateral knee replacements and bilateral hip replacements Hematologic/Oncology: Negative; no easy bruising, bleeding Endocrine: Negative; no heat/cold intolerance; no diabetes Neuro: Negative; no changes in balance, headaches Skin: Negative; No rashes or skin lesions Psychiatric: Negative; No behavioral problems, depression Sleep: Positive for obstructive sleep apnea on CPAP therapy; No snoring, daytime sleepiness, hypersomnolence, bruxism, restless legs, hypnogognic hallucinations, no cataplexy Other comprehensive 14 point system review is negative.   PE BP 116/62   Pulse 70   Ht 5' 5.5" (1.664 m)   Wt 249 lb 9.6 oz (113.2 kg)   BMI 40.90 kg/m    Repeat blood pressure by me 114/64 supine and 112/64 standing  Wt Readings from Last 3 Encounters:  06/04/18 249 lb 9.6 oz (113.2 kg)  02/05/18 250 lb 14.4 oz (113.8 kg)  01/15/18 252 lb (114.3 kg)   General: Alert, oriented, no distress.  Skin: normal turgor, no rashes, warm and dry HEENT: Normocephalic, atraumatic. Pupils equal round and reactive to  light; sclera anicteric; extraocular muscles intact;  Nose without nasal septal hypertrophy Mouth/Parynx benign; Mallinpatti scale 3 Neck: No JVD, no carotid bruits; normal carotid upstroke Lungs: clear to ausculatation and percussion; no wheezing or rales Chest wall: without tenderness to palpitation Heart: PMI not displaced, RRR, s1 s2 normal, 1/6 systolic murmur, no diastolic murmur, no rubs, gallops, thrills, or heaves Abdomen: Mild central adiposity; soft, nontender; no hepatosplenomehaly, BS+; abdominal aorta nontender and not dilated by palpation. Back: no CVA tenderness Pulses 2+ Musculoskeletal: full range of motion, normal strength, no joint deformities Extremities: no clubbing cyanosis or edema, Homan's sign negative  Neurologic: grossly nonfocal; Cranial nerves grossly wnl Psychologic: Normal mood and affect   ECG (independently read by me): Normal sinus rhythm at 70 bpm.  T wave abnormality, nonspecific V1 V2.  Normal intervals.  No ectopy.  April 2018 ECG (independently read by me): Normal sinus rhythm at 65 bpm.  Nonspecific T waves.  Normal intervals.  March 2017 ECG (independently read by me): Normal sinus rhythm at 81 bpm isolate a PVC.  Nonspecific T-wave changes with previously noted T-wave inversion in V1, V2.  QTc interval 487 ms.  February 2016 ECG (independently read by me): Normal sinus rhythm at 81 bpm.  Previously noted T-wave inversion V1 and  V2 which is old and has been documented for over 10 years.  Mild QTC increased at 469 ms  February 2015 ECG (independently read by me): Sinus rhythm at 76 beats per minute period. She noted T wave inversion in V1 V2 which is old.  LABS: BMP Latest Ref Rng & Units 02/05/2018 01/25/2017 07/20/2016  Glucose 70 - 99 mg/dL 102(H) 93 126(H)  BUN 6 - 23 mg/dL 21 20 20   Creatinine 0.40 - 1.20 mg/dL 0.71 0.60 0.70  Sodium 135 - 145 mEq/L 140 140 138  Potassium 3.5 - 5.1 mEq/L 4.6 4.2 4.5  Chloride 96 - 112 mEq/L 103 106 103  CO2  19 - 32 mEq/L 30 27 28   Calcium 8.4 - 10.5 mg/dL 9.4 9.0 9.2   Hepatic Function Latest Ref Rng & Units 02/05/2018 04/26/2017 01/25/2017  Total Protein 6.0 - 8.3 g/dL 6.6 6.4 6.3  Albumin 3.5 - 5.2 g/dL 4.1 4.4 4.2  AST 0 - 37 U/L 37 42(H) 42(H)  ALT 0 - 35 U/L 25 33 30  Alk Phosphatase 39 - 117 U/L 79 75 77  Total Bilirubin 0.2 - 1.2 mg/dL 0.5 0.5 0.4  Bilirubin, Direct 0.0 - 0.3 mg/dL 0.1 0.1 0.1    CBC Latest Ref Rng & Units 02/05/2018 01/25/2017 01/22/2016  WBC 4.0 - 10.5 K/uL 3.4(L) 3.7(L) 4.0  Hemoglobin 12.0 - 15.0 g/dL 13.0 13.2 12.8  Hematocrit 36.0 - 46.0 % 37.7 38.0 37.4  Platelets 150.0 - 400.0 K/uL 228.0 218.0 211.0    Lab Results  Component Value Date   MCV 97.3 02/05/2018   MCV 97.2 01/25/2017   MCV 94.2 01/22/2016    Lab Results  Component Value Date   TSH 1.58 01/25/2017   Lab Results  Component Value Date   HGBA1C 5.4 02/05/2018   Lipid Panel     Component Value Date/Time   CHOL 160 02/05/2018 1013   TRIG 75.0 02/05/2018 1013   HDL 88.00 02/05/2018 1013   CHOLHDL 2 02/05/2018 1013   VLDL 15.0 02/05/2018 1013   LDLCALC 57 02/05/2018 1013     RADIOLOGY: No results found.  IMPRESSION:  1. Mild CAD   2. Hyperlipidemia LDL goal <70   3. Essential hypertension   4. OSA (obstructive sleep apnea)   5. Raynaud's phenomenon without gangrene   6. 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 She underwent repeat cardiac catheterization in 2008 prior to back surgery after nuclear perfusion study raise the possibility of ischemia.  Coronary angiography showed normalization of coronary arteries without significant obstruction. She is doing well and denies any recent episodes of digital ischemia or Raynaud's symptomatology. She has osteoarthritic changes of her hands which have become progressive.  She  continues to do well and has stable blood pressure on amlodipine 5 mg, carvedilol 3.125 mg twice a day and ramipril 10 mg.  She has a known cardiac murmur and  a repeat echo Doppler study showed normal ejection fraction at 55-60%.  There were no wall motion abnormalities.  She had normal diastolic parameters.  There was trivial aortic insufficiency.  She has had issues with him for the of her hips and knees.  She is now morbidly obese with a BMI of 40.9 but she does not exercise regularly due to her back pain and arthritis issues.  Reviewed recent laboratory.  Lipid studies are excellent with an LDL of 57 in March 2019 on her current dose of atorvastatin 20 mg.  She is on Cymbalta for nerve pain and takes Lyrica 2 times a day.  She is stable from a cardiovascular standpoint.  She continues to use CPAP with 100% compliance. As long as she remains well I will see her in 1 year for reevaluation.   Troy Sine, MD, [facility]  06/05/2018 8:26 PM

## 2018-06-05 ENCOUNTER — Encounter: Payer: Self-pay | Admitting: Cardiovascular Disease

## 2018-06-06 NOTE — Progress Notes (Deleted)
Office Visit Note  Patient: Terri Rodriguez             Date of Birth: [birth year]           MRN: [id number]             PCP: Burnis Medin, MD Referring: Burnis Medin, MD Visit Date: 06/20/2018 Occupation: @GUAROCC @    Subjective:  No chief complaint on file.   History of Present Illness: Terri Rodriguez is a 72 y.o. female ***   Activities of Daily Living:  Patient reports morning stiffness for *** {minute/hour:19697}.   Patient {ACTIONS;DENIES/REPORTS:21021675::"Denies"} nocturnal pain.  Difficulty dressing/grooming: {ACTIONS;DENIES/REPORTS:21021675::"Denies"} Difficulty climbing stairs: {ACTIONS;DENIES/REPORTS:21021675::"Denies"} Difficulty getting out of chair: {ACTIONS;DENIES/REPORTS:21021675::"Denies"} Difficulty using hands for taps, buttons, cutlery, and/or writing: {ACTIONS;DENIES/REPORTS:21021675::"Denies"}   No Rheumatology ROS completed.   PMFS History:  Patient Active Problem List   Diagnosis Date Noted  . Primary osteoarthritis of both feet 06/18/2017  . Status post bilateral total hip replacement 06/18/2017  . Status post total knee replacement, bilateral 06/18/2017  . Degenerative disc disease, lumbar status post fusion 06/18/2017  . Degenerative disc disease, cervical 06/18/2017  . Raynaud's disease without gangrene 06/18/2017  . History of cholelithiasis 06/18/2017  . Other chronic pain 02/01/2017  . Visit for preventive health examination 01/21/2015  . Medicare annual wellness visit, subsequent 01/21/2015  . BMI 40.0-44.9, adult ([place]) 01/21/2015  . Hyperlipidemia LDL goal <70 01/12/2015  . Morbid obesity ([place]) 01/12/2015  . Hyperglycemia 01/10/2014  . Mild CAD 01/06/2014  . Medication monitoring encounter 09/22/2012  . Hx of cardiac catheterization   . Postmenopausal HRT (hormone replacement therapy) 09/20/2012  . Adenomatous polyp of colon 06/14/2012  . ROSACEA 04/26/2010  . Obstructive sleep apnea 09/09/2009  . LEUKOPENIA, CHRONIC 01/08/2008   . HYPERLIPIDEMIA 08/17/2007  . ALLERGIC RHINITIS 08/17/2007  . GERD 08/17/2007  . Primary osteoarthritis of both hands 08/17/2007    Past Medical History:  Diagnosis Date  . ALLERGIC RHINITIS   . Anginal pain ([place])   . Cholelithiasis 06/09/08  . Complication of anesthesia 2012   went to ICU after bilateral knees ,? unstable vital signs  . Diverticulitis   . EPICONDYLITIS, LATERAL    osteoarthritis, previous joint replacements  . GERD   . Hepatic hemangioma 06/09/08  . Hiatal hernia 03/2000  . Hx of adenomatous polyp of colon 06/05/08  . Hx of cardiac catheterization 2008    clean coronarys DrKelly   . Hx of chest pain    neg cath remot hx of narrowwing lad in 2003 nl 2008, none in many years  . HYPERLIPIDEMIA   . IBS (irritable bowel syndrome)   . LEUKOPENIA, CHRONIC   . Mononucleosis 1963  . Nasal injury 06/14/2012  . RAYNAUD'S SYNDROME, HX OF    improved after cardiac meds initiated  . Rosacea    facial  . SLEEP APNEA, OBSTRUCTIVE    cpap, settings "automatic" settings 11-12  . Subacute thyroiditis   . UNSPECIFIED ANEMIA     Family History  Problem Relation Age of Onset  . Rheum arthritis Mother   . Diabetes Mother   . Heart failure Mother   . Thrombocytopenia Mother   . Sleep apnea Mother   . Ulcers Mother        PUD  . Deep vein thrombosis Father   . Pulmonary embolism Father   . Osteoarthritis Father   . Atrial fibrillation Father   . Dementia Father   . Sleep apnea Father   .  Sleep apnea Brother   . Obesity Brother   . Sleep apnea Brother   . Obesity Brother   . Colon cancer Maternal Aunt 51  . Colon cancer Maternal Aunt 90  . Pancreatic cancer Unknown   . Uterine cancer Unknown   . Leukemia Unknown   . Inflammatory bowel disease Unknown        aunt  . Congenital adrenal hyperplasia Grandchild   . Atrial fibrillation Brother    Past Surgical History:  Procedure Laterality Date  . ABDOMINAL HYSTERECTOMY    . BACK SURGERY  02/06/2008  . CARDIAC  CATHETERIZATION  04/07/2007   Noncritical coronary artery disease. Contiue medical therapy.  Marland Kitchen CARDIAC CATHETERIZATION  12/03/2007   Normal LV function. Mild angiographic mitral valve prolapse. Normal coronary arteries.  Marland Kitchen CARDIOVASCULAR STRESS TEST  11/09/2007   Moderate ischemia in Mid Anterior, Mid Anteroseptal, Apical Anterior, and Apical Septal regions. EKG negative for ischemia.  Marland Kitchen [place]  . dental implants  11/2002,11/2011,11/2012  . DILATION AND CURETTAGE OF UTERUS    . JOINT REPLACEMENT  09/21/2011   bilateral knee  . KNEE ARTHROSCOPY     bilateral  . lumbar surgery fixation with disc replacement  10/08   Left  . NASAL SEPTOPLASTY W/ TURBINOPLASTY    . NOCTURNAL POLYSOMNOGRAM  12/31/2006   Severe obstructive sleep apnea. AHI-87/hr  . REPLACEMENT TOTAL KNEE     bilateral  . TONSILLECTOMY    . TONSILLECTOMY  1952  . TOTAL HIP ARTHROPLASTY Left 10/21/2013   Procedure: LEFT TOTAL HIP ARTHROPLASTY ANTERIOR APPROACH;  Surgeon: [name], MD;  Location: [facility];  Service: Orthopedics;  Laterality: Left;  . TOTAL HIP ARTHROPLASTY Right 07/16/2014   Procedure: RIGHT TOTAL HIP ARTHROPLASTY ANTERIOR APPROACH;  Surgeon: [name], MD;  Location: [facility];  Service: Orthopedics;  Laterality: Right;  . TRANSTHORACIC ECHOCARDIOGRAM  11/09/2007   EF 49%, LV systolic function normal. Mild aortic root dilation.  . 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     Objective: Vital Signs: There were no vitals taken for this visit.   Physical Exam   Musculoskeletal Exam: ***  CDAI Exam: No CDAI exam completed.    Investigation: No additional findings. CBC Latest Ref Rng & Units 02/05/2018 01/25/2017 01/22/2016  WBC 4.0 - 10.5 K/uL 3.4(L) 3.7(L) 4.0  Hemoglobin 12.0 - 15.0 g/dL 13.0  13.2 12.8  Hematocrit 36.0 - 46.0 % 37.7 38.0 37.4  Platelets 150.0 - 400.0 K/uL 228.0 218.0 211.0   CMP Latest Ref Rng & Units 02/05/2018 04/26/2017 01/25/2017  Glucose 70 - 99 mg/dL 102(H) - 93  BUN 6 - 23 mg/dL 21 - 20  Creatinine 0.40 - 1.20 mg/dL 0.71 - 0.60  Sodium 135 - 145 mEq/L 140 - 140  Potassium 3.5 - 5.1 mEq/L 4.6 - 4.2  Chloride 96 - 112 mEq/L 103 - 106  CO2 19 - 32 mEq/L 30 - 27  Calcium 8.4 - 10.5 mg/dL 9.4 - 9.0  Total Protein 6.0 - 8.3 g/dL 6.6 6.4 6.3  Total Bilirubin 0.2 - 1.2 mg/dL 0.5 0.5 0.4  Alkaline Phos 39 - 117 U/L 79 75 77  AST 0 - 37 U/L 37 42(H) 42(H)  ALT 0 - 35 U/L 25 33  30    Imaging: No results found.  Speciality Comments: No specialty comments available.    Procedures:  No procedures performed Allergies: Codeine and Crab [shellfish allergy]   Assessment / Plan:     Visit Diagnoses: No diagnosis found.    Orders: No orders of the defined types were placed in this encounter.  No orders of the defined types were placed in this encounter.   Face-to-face time spent with patient was *** minutes. Greater than 50% of time was spent in counseling and coordination of care.  Follow-Up Instructions: No follow-ups on file.   Earnestine Mealing, CMA  Note - This record has been created using Editor, commissioning.  Chart creation errors have been sought, but may not always  have been located. Such creation errors do not reflect on  the standard of medical care.

## 2018-06-14 ENCOUNTER — Other Ambulatory Visit: Payer: Self-pay | Admitting: Cardiovascular Disease

## 2018-06-20 ENCOUNTER — Ambulatory Visit: Payer: BLUE CROSS/BLUE SHIELD | Admitting: Rheumatology

## 2018-06-21 ENCOUNTER — Other Ambulatory Visit: Payer: Self-pay | Admitting: Obstetrics & Gynecology

## 2018-06-21 NOTE — Telephone Encounter (Signed)
Medication refill request: Estrace 0.5 mg  Last AEX:  01/15/18 Next AEX: 03/29/19 Last MMG (if hormonal medication request): 07/05/17 Bi-Rads Category 1 Neg  Refill authorized: please refill if appropriate

## 2018-06-30 ENCOUNTER — Other Ambulatory Visit: Payer: Self-pay | Admitting: Internal Medicine

## 2018-07-02 NOTE — Telephone Encounter (Signed)
Please advise Dr Regis Bill, thanks.   Both last filled #[phone] x 0rf on 03/28/18 Last OV 02/05/18

## 2018-07-14 ENCOUNTER — Other Ambulatory Visit: Payer: Self-pay | Admitting: Cardiovascular Disease

## 2018-07-18 ENCOUNTER — Other Ambulatory Visit: Payer: Self-pay | Admitting: Cardiovascular Disease

## 2018-07-18 NOTE — Telephone Encounter (Signed)
Rx request sent to pharmacy.  

## 2018-08-02 DIAGNOSIS — M160 Bilateral primary osteoarthritis of hip: Secondary | ICD-10-CM | POA: Diagnosis not present

## 2018-08-02 DIAGNOSIS — M170 Bilateral primary osteoarthritis of knee: Secondary | ICD-10-CM | POA: Diagnosis not present

## 2018-08-13 ENCOUNTER — Other Ambulatory Visit: Payer: Self-pay

## 2018-08-13 MED ORDER — CARVEDILOL 3.125 MG PO TABS: ORAL_TABLET | ORAL | 8 refills | Status: DC

## 2018-08-14 DIAGNOSIS — M545 Low back pain: Secondary | ICD-10-CM | POA: Diagnosis not present

## 2018-08-16 DIAGNOSIS — M545 Low back pain: Secondary | ICD-10-CM | POA: Diagnosis not present

## 2018-08-19 ENCOUNTER — Other Ambulatory Visit: Payer: Self-pay | Admitting: Internal Medicine

## 2018-08-24 ENCOUNTER — Telehealth: Payer: Self-pay | Admitting: Internal Medicine

## 2018-08-24 DIAGNOSIS — M545 Low back pain: Secondary | ICD-10-CM | POA: Diagnosis not present

## 2018-08-24 NOTE — Telephone Encounter (Signed)
Spoke with patient, patient ordered supplies 08/10. And still has not received any supplies. Called placed to [facility] regarding supplies. Per [facility] they are backed up in the supply department and are working as quickly as possible to get this resolved. Called placed to patient, informed her of the above. Voiced understanding. Nothing further needed at this time.

## 2018-08-27 DIAGNOSIS — M545 Low back pain: Secondary | ICD-10-CM | POA: Diagnosis not present

## 2018-08-28 DIAGNOSIS — M546 Pain in thoracic spine: Secondary | ICD-10-CM | POA: Diagnosis not present

## 2018-08-28 DIAGNOSIS — M5136 Other intervertebral disc degeneration, lumbar region: Secondary | ICD-10-CM | POA: Diagnosis not present

## 2018-08-28 DIAGNOSIS — Z6839 Body mass index (BMI) 39.0-39.9, adult: Secondary | ICD-10-CM | POA: Diagnosis not present

## 2018-08-28 DIAGNOSIS — M5416 Radiculopathy, lumbar region: Secondary | ICD-10-CM | POA: Diagnosis not present

## 2018-08-28 DIAGNOSIS — Z981 Arthrodesis status: Secondary | ICD-10-CM | POA: Diagnosis not present

## 2018-08-28 DIAGNOSIS — M4726 Other spondylosis with radiculopathy, lumbar region: Secondary | ICD-10-CM | POA: Diagnosis not present

## 2018-08-28 DIAGNOSIS — M549 Dorsalgia, unspecified: Secondary | ICD-10-CM | POA: Diagnosis not present

## 2018-08-30 DIAGNOSIS — M545 Low back pain: Secondary | ICD-10-CM | POA: Diagnosis not present

## 2018-08-31 DIAGNOSIS — Z1231 Encounter for screening mammogram for malignant neoplasm of breast: Secondary | ICD-10-CM | POA: Diagnosis not present

## 2018-08-31 LAB — HM MAMMOGRAPHY

## 2018-09-03 DIAGNOSIS — M545 Low back pain: Secondary | ICD-10-CM | POA: Diagnosis not present

## 2018-09-06 DIAGNOSIS — M545 Low back pain: Secondary | ICD-10-CM | POA: Diagnosis not present

## 2018-09-07 ENCOUNTER — Ambulatory Visit (INDEPENDENT_AMBULATORY_CARE_PROVIDER_SITE_OTHER): Payer: Medicare Other

## 2018-09-07 DIAGNOSIS — Z23 Encounter for immunization: Secondary | ICD-10-CM | POA: Diagnosis not present

## 2018-09-07 NOTE — Progress Notes (Signed)
Per orders of Dr. Regis Bill, injection of F given by Rebecca Eaton. Patient tolerated injection well.

## 2018-09-10 ENCOUNTER — Encounter: Payer: Self-pay | Admitting: Internal Medicine

## 2018-09-19 ENCOUNTER — Encounter: Payer: Self-pay | Admitting: Obstetrics & Gynecology

## 2018-09-19 DIAGNOSIS — M545 Low back pain: Secondary | ICD-10-CM | POA: Diagnosis not present

## 2018-09-20 ENCOUNTER — Encounter: Payer: Self-pay | Admitting: Internal Medicine

## 2018-09-21 ENCOUNTER — Encounter: Payer: Self-pay | Admitting: Internal Medicine

## 2018-09-21 ENCOUNTER — Ambulatory Visit (INDEPENDENT_AMBULATORY_CARE_PROVIDER_SITE_OTHER): Payer: Medicare Other | Admitting: Internal Medicine

## 2018-09-21 DIAGNOSIS — M19071 Primary osteoarthritis, right ankle and foot: Secondary | ICD-10-CM | POA: Diagnosis not present

## 2018-09-21 DIAGNOSIS — G4733 Obstructive sleep apnea (adult) (pediatric): Secondary | ICD-10-CM | POA: Diagnosis not present

## 2018-09-21 DIAGNOSIS — I2510 Atherosclerotic heart disease of native coronary artery without angina pectoris: Secondary | ICD-10-CM

## 2018-09-21 DIAGNOSIS — M19072 Primary osteoarthritis, left ankle and foot: Secondary | ICD-10-CM

## 2018-09-21 NOTE — Assessment & Plan Note (Signed)
She describes significant discomfort and limitation associated with generalized osteoarthritis.  She is cared for elsewhere for this problem.

## 2018-09-21 NOTE — Assessment & Plan Note (Signed)
She continues to benefit significantly from her CPAP with improved sleep.  Download confirms excellent compliance and control.  We discussed pressures. Plan-continue CPAP auto 4-18

## 2018-09-21 NOTE — Patient Instructions (Signed)
We can continue CPAP auto 4-18, mask of choice, humidifier supplies, airview  Please call if we can help
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
## 2018-09-24 DIAGNOSIS — M545 Low back pain: Secondary | ICD-10-CM | POA: Diagnosis not present

## 2018-09-28 ENCOUNTER — Other Ambulatory Visit: Payer: Self-pay | Admitting: Internal Medicine

## 2018-10-04 DIAGNOSIS — M545 Low back pain: Secondary | ICD-10-CM | POA: Diagnosis not present

## 2018-10-05 DIAGNOSIS — W19XXXA Unspecified fall, initial encounter: Secondary | ICD-10-CM

## 2018-10-05 HISTORY — DX: Unspecified fall, initial encounter: W19.XXXA

## 2018-10-11 DIAGNOSIS — M545 Low back pain: Secondary | ICD-10-CM | POA: Diagnosis not present

## 2018-10-12 ENCOUNTER — Encounter (HOSPITAL_COMMUNITY): Payer: Self-pay | Admitting: Emergency Medicine

## 2018-10-12 ENCOUNTER — Emergency Department (HOSPITAL_COMMUNITY): Payer: Medicare Other

## 2018-10-12 ENCOUNTER — Emergency Department (HOSPITAL_COMMUNITY)
Admission: EM | Admit: 2018-10-12 | Discharge: 2018-10-12 | Disposition: A | Discharge status: Home / Self Care | Payer: Medicare Other | Attending: Emergency Medicine | Admitting: Emergency Medicine

## 2018-10-12 ENCOUNTER — Other Ambulatory Visit: Payer: Self-pay

## 2018-10-12 DIAGNOSIS — I2510 Atherosclerotic heart disease of native coronary artery without angina pectoris: Secondary | ICD-10-CM | POA: Diagnosis not present

## 2018-10-12 DIAGNOSIS — W010XXA Fall on same level from slipping, tripping and stumbling without subsequent striking against object, initial encounter: Secondary | ICD-10-CM | POA: Diagnosis not present

## 2018-10-12 DIAGNOSIS — S0181XA Laceration without foreign body of other part of head, initial encounter: Secondary | ICD-10-CM | POA: Insufficient documentation

## 2018-10-12 DIAGNOSIS — Y92018 Other place in single-family (private) house as the place of occurrence of the external cause: Secondary | ICD-10-CM | POA: Diagnosis not present

## 2018-10-12 DIAGNOSIS — S0083XA Contusion of other part of head, initial encounter: Secondary | ICD-10-CM | POA: Diagnosis not present

## 2018-10-12 DIAGNOSIS — Z96643 Presence of artificial hip joint, bilateral: Secondary | ICD-10-CM | POA: Insufficient documentation

## 2018-10-12 DIAGNOSIS — Z96653 Presence of artificial knee joint, bilateral: Secondary | ICD-10-CM | POA: Insufficient documentation

## 2018-10-12 DIAGNOSIS — Y999 Unspecified external cause status: Secondary | ICD-10-CM | POA: Diagnosis not present

## 2018-10-12 DIAGNOSIS — Y9301 Activity, walking, marching and hiking: Secondary | ICD-10-CM | POA: Diagnosis not present

## 2018-10-12 DIAGNOSIS — S0993XA Unspecified injury of face, initial encounter: Secondary | ICD-10-CM | POA: Diagnosis not present

## 2018-10-12 DIAGNOSIS — W19XXXA Unspecified fall, initial encounter: Secondary | ICD-10-CM

## 2018-10-12 DIAGNOSIS — Z7982 Long term (current) use of aspirin: Secondary | ICD-10-CM | POA: Insufficient documentation

## 2018-10-12 DIAGNOSIS — Z79899 Other long term (current) drug therapy: Secondary | ICD-10-CM | POA: Diagnosis not present

## 2018-10-12 DIAGNOSIS — S199XXA Unspecified injury of neck, initial encounter: Secondary | ICD-10-CM | POA: Diagnosis not present

## 2018-10-12 DIAGNOSIS — R410 Disorientation, unspecified: Secondary | ICD-10-CM | POA: Diagnosis not present

## 2018-10-12 DIAGNOSIS — S0990XA Unspecified injury of head, initial encounter: Secondary | ICD-10-CM | POA: Diagnosis not present

## 2018-10-12 MED ORDER — LIDOCAINE-EPINEPHRINE (PF) 2 %-1:200000 IJ SOLN
10.0000 mL | Freq: Once | INTRAMUSCULAR | Status: AC
  Administered 2018-10-12: 10 mL via INTRADERMAL
  Filled 2018-10-12: qty 10

## 2018-10-12 NOTE — ED Triage Notes (Signed)
Patient from home, had a mechanical fall at home. Hit her head on her driveway when she tripped on a drain hole. No blood thinners other than a baby aspirin. AOx4. No other complaints at this time. Has head lac on back of head and right side of forehead. Bleeding controlled at this time.

## 2018-10-12 NOTE — ED Notes (Signed)
Patient verbalizes understanding of discharge instructions. Opportunity for questioning and answers were provided. Armband removed by staff, pt discharged from ED.  

## 2018-10-12 NOTE — ED Notes (Signed)
Patient transported to CT 

## 2018-10-12 NOTE — ED Provider Notes (Signed)
[facility] EMERGENCY DEPARTMENT Provider Note   CSN: [id number] Arrival date & time: 10/12/18  1657     History   Chief Complaint Chief Complaint  Patient presents with  . Fall    HPI Terri Rodriguez is a 72 y.o. female.  HPI 72 year old female with past medical history as below here with laceration and contusion to the head.  Patient states she was walking at her house today.  She tripped on the divider between concrete slabs, falling forward.  She landed directly onto her right face.  She did not lose consciousness but was briefly stunned.  She was unable to brace herself.  She had some mild confusion afterwards.  She also sustained multiple lacerations.  Her neighbors were walking by and told her to come to the ED.  She denies any blood thinner use.  She takes a baby aspirin only.  Denies any neck pain.  She does have a moderate, aching, throbbing, right-sided headache.  No visual change.  No diplopia.  No other complaints.  Past Medical History:  Diagnosis Date  . ALLERGIC RHINITIS   . Anginal pain ([place])   . Cholelithiasis 06/09/08  . Complication of anesthesia 2012   went to ICU after bilateral knees ,? unstable vital signs  . Diverticulitis   . EPICONDYLITIS, LATERAL    osteoarthritis, previous joint replacements  . GERD   . Hepatic hemangioma 06/09/08  . Hiatal hernia 03/2000  . Hx of adenomatous polyp of colon 06/05/08  . Hx of cardiac catheterization 2008    clean coronarys DrKelly   . Hx of chest pain    neg cath remot hx of narrowwing lad in 2003 nl 2008, none in many years  . HYPERLIPIDEMIA   . IBS (irritable bowel syndrome)   . LEUKOPENIA, CHRONIC   . Mononucleosis 1963  . Nasal injury 06/14/2012  . RAYNAUD'S SYNDROME, HX OF    improved after cardiac meds initiated  . Rosacea    facial  . SLEEP APNEA, OBSTRUCTIVE    cpap, settings "automatic" settings 11-12  . Subacute thyroiditis   . UNSPECIFIED ANEMIA     Patient Active Problem List   Diagnosis Date Noted  . Primary osteoarthritis of both feet 06/18/2017  . Status post bilateral total hip replacement 06/18/2017  . Status post total knee replacement, bilateral 06/18/2017  . Degenerative disc disease, lumbar status post fusion 06/18/2017  . Degenerative disc disease, cervical 06/18/2017  . Raynaud's disease without gangrene 06/18/2017  . History of cholelithiasis 06/18/2017  . Other chronic pain 02/01/2017  . Visit for preventive health examination 01/21/2015  . Medicare annual wellness visit, subsequent 01/21/2015  . BMI 40.0-44.9, adult ([place]) 01/21/2015  . Hyperlipidemia LDL goal <70 01/12/2015  . Morbid obesity ([place]) 01/12/2015  . Hyperglycemia 01/10/2014  . Mild CAD 01/06/2014  . Medication monitoring encounter 09/22/2012  . Hx of cardiac catheterization   . Postmenopausal HRT (hormone replacement therapy) 09/20/2012  . Adenomatous polyp of colon 06/14/2012  . ROSACEA 04/26/2010  . Obstructive sleep apnea 09/09/2009  . LEUKOPENIA, CHRONIC 01/08/2008  . HYPERLIPIDEMIA 08/17/2007  . ALLERGIC RHINITIS 08/17/2007  . GERD 08/17/2007  . Primary osteoarthritis of both hands 08/17/2007    Past Surgical History:  Procedure Laterality Date  . ABDOMINAL HYSTERECTOMY    . BACK SURGERY  02/06/2008  . CARDIAC CATHETERIZATION  04/07/2007   Noncritical coronary artery disease. Contiue medical therapy.  Marland Kitchen CARDIAC CATHETERIZATION  12/03/2007   Normal LV function. Mild angiographic mitral valve  prolapse. Normal coronary arteries.  Marland Kitchen CARDIOVASCULAR STRESS TEST  11/09/2007   Moderate ischemia in Mid Anterior, Mid Anteroseptal, Apical Anterior, and Apical Septal regions. EKG negative for ischemia.  Marland Kitchen [place]  . dental implants  11/2002,11/2011,11/2012  . DILATION AND CURETTAGE OF UTERUS    . JOINT REPLACEMENT  09/21/2011   bilateral knee  . KNEE ARTHROSCOPY     bilateral  . lumbar surgery fixation with disc replacement  10/08   Left  . NASAL  SEPTOPLASTY W/ TURBINOPLASTY    . NOCTURNAL POLYSOMNOGRAM  12/31/2006   Severe obstructive sleep apnea. AHI-87/hr  . REPLACEMENT TOTAL KNEE     bilateral  . TONSILLECTOMY    . TONSILLECTOMY  1952  . TOTAL HIP ARTHROPLASTY Left 10/21/2013   Procedure: LEFT TOTAL HIP ARTHROPLASTY ANTERIOR APPROACH;  Surgeon: [name], MD;  Location: [facility];  Service: Orthopedics;  Laterality: Left;  . TOTAL HIP ARTHROPLASTY Right 07/16/2014   Procedure: RIGHT TOTAL HIP ARTHROPLASTY ANTERIOR APPROACH;  Surgeon: [name], MD;  Location: [facility];  Service: Orthopedics;  Laterality: Right;  . TRANSTHORACIC ECHOCARDIOGRAM  11/09/2007   EF 75%, LV systolic function normal. Mild aortic root dilation.  . WRIST SURGERY Left 04/03/14   ORIF "distal radial head fracture"     OB History    Gravida  3   Para  1   Term      Preterm      AB      Living  1     SAB      TAB      Ectopic      Multiple      Live Births               Home Medications    Prior to Admission medications   Medication Sig Start Date End Date Taking? Authorizing Provider  acetaminophen (TYLENOL) 500 MG tablet Take 1,000 mg by mouth every 6 (six) hours as needed for mild pain or headache.     [provider]  Alum Hydroxide-Mag Carbonate (GAVISCON PO) Take 1 tablet by mouth 4 (four) times daily as needed (heart burn).     [provider]  amLODipine (NORVASC) 5 MG tablet TAKE 1 TABLET BY MOUTH DAILY 07/17/18   Troy Sine, MD  amoxicillin (AMOXIL) 500 MG capsule 2,000 mg. 1 hour prior to dental procedures.    [provider]  aspirin 81 MG tablet Take 81 mg by mouth daily.    [provider]  atorvastatin (LIPITOR) 20 MG tablet TAKE 1 TABLET BY MOUTH DAILY AT 6:00 PM 07/17/18   Troy Sine, MD  Azelaic Acid (FINACEA) 15 % cream Apply 1 application topically 2 (two) times daily. After skin is thoroughly washed and patted dry, gently but thoroughly massage a thin film of  azelaic acid cream into the affected area twice daily, in the morning and evening.    [provider]  carvedilol (COREG) 3.125 MG tablet TAKE 1 TABLET BY MOUTH TWICE DAILY WITH A MEAL 08/13/18   [place], Peter M, MD  celecoxib (CELEBREX) 200 MG capsule TAKE 1 CAPSULE BY MOUTH DAILY 10/02/18   Laurey Morale, MD  Cholecalciferol (VITAMIN D3) 2000 UNITS TABS Take 1 tablet by mouth daily.    [provider]  clindamycin (CLEOCIN T) 1 % external solution 2 (two) times daily as needed. 03/29/15   [provider]  clobetasol ointment (TEMOVATE) 0.05 % Apply AA of  skin BID 10/20/17   [provider]  diclofenac sodium (VOLTAREN) 1 % GEL Apply 2-4 g topically 4 (four) times daily. As needed for  Arthritis  Pain. 11/08/17   Panosh, Standley Brooking, MD  DULoxetine (CYMBALTA) 60 MG capsule TAKE 1 CAPSULE BY MOUTH EVERY EVENING 10/02/18   Laurey Morale, MD  estradiol (ESTRACE) 0.5 MG tablet TAKE 1/2 TABLET(0.25 MG) BY MOUTH DAILY 06/24/18   Megan Salon, MD  fexofenadine (ALLEGRA) 180 MG tablet Take 180 mg by mouth daily. Takes in PM    [provider]  fluocinonide (LIDEX) 0.05 % external solution Apply AA on scalp PRN 10/20/17   [provider]  metroNIDAZOLE (METROGEL) 1 % gel Apply 1 application topically daily. Apply to face    [provider]  Multiple Vitamins-Minerals (CENTRUM ADULTS PO) Take 1 tablet by mouth daily.    [provider]  nitroGLYCERIN (NITROSTAT) 0.4 MG SL tablet PLACE 1 TABLET UNDER THE TONGUE EVERY 5 MINUTES AS NEEDED FOR CHEST PAIN 03/10/17   Troy Sine, MD  omeprazole-sodium bicarbonate (ZEGERID) 40-1100 MG per capsule Take 1 capsule by mouth daily before breakfast.     [provider]  pregabalin (LYRICA) 50 MG capsule Take 1 capsule (50 mg total) by mouth 3 (three) times daily. 08/22/18   Panosh, Standley Brooking, MD  ramipril (ALTACE) 10 MG capsule TAKE ONE CAPSULE BY MOUTH DAILY 06/14/18   Troy Sine, MD    valACYclovir (VALTREX) 1000 MG tablet Take 2 grams po x 2 doses with fever blisters 01/15/18   Megan Salon, MD    Family History Family History  Problem Relation Age of Onset  . Rheum arthritis Mother   . Diabetes Mother   . Heart failure Mother   . Thrombocytopenia Mother   . Sleep apnea Mother   . Ulcers Mother        PUD  . Deep vein thrombosis Father   . Pulmonary embolism Father   . Osteoarthritis Father   . Atrial fibrillation Father   . Dementia Father   . Sleep apnea Father   . Sleep apnea Brother   . Obesity Brother   . Sleep apnea Brother   . Obesity Brother   . Colon cancer Maternal Aunt 20  . Colon cancer Maternal Aunt 90  . Pancreatic cancer Unknown   . Uterine cancer Unknown   . Leukemia Unknown   . Inflammatory bowel disease Unknown        aunt  . Congenital adrenal hyperplasia Grandchild   . Atrial fibrillation Brother     Social History Social History   Tobacco Use  . Smoking status: Never Smoker  . Smokeless tobacco: Never Used  Substance Use Topics  . Alcohol use: Yes    Alcohol/week: 14.0 standard drinks    Types: 14 Glasses of wine per week    Comment: 1 glass nightly  . Drug use: No     Allergies   Codeine and Crab [shellfish allergy]   Review of Systems Review of Systems  Constitutional: Negative for chills, fatigue and fever.  HENT: Negative for congestion and rhinorrhea.   Eyes: Negative for visual disturbance.  Respiratory: Negative for cough, shortness of breath and wheezing.   Cardiovascular: Negative for chest pain and leg swelling.  Gastrointestinal: Negative for abdominal pain, diarrhea, nausea and vomiting.  Genitourinary: Negative for dysuria and flank pain.  Musculoskeletal: Negative for neck pain and neck stiffness.  Skin: Positive for wound. Negative for rash.  Allergic/Immunologic: Negative for immunocompromised state.  Neurological: Positive for headaches. Negative for syncope and weakness.  All other systems  reviewed and are negative.    Physical Exam Updated Vital Signs BP 140/66   Pulse 71   Ht 5\' 5"  (1.651 m)   Wt 113.3 kg   SpO2 98%   BMI 41.57 kg/m   Physical Exam  Constitutional: She is oriented to person, place, and time. She appears well-developed and well-nourished. No distress.  HENT:  Head: Normocephalic and atraumatic.  Superficial excoriations and lacerations to the right temple area.  Surrounding superficial abrasions.  There is moderate associated edema and ecchymoses extending to the right zygoma.  No instability.  No epistaxis or nasal injury.  Oropharynx clear with no dental trauma.  Normal jaw occlusion and range of motion.  Eyes: Conjunctivae are normal.  No conjunctival erythema, injection, or drainage.  Neck: Neck supple.  Cardiovascular: Normal rate, regular rhythm and normal heart sounds. Exam reveals no friction rub.  No murmur heard. Pulmonary/Chest: Effort normal and breath sounds normal. No respiratory distress. She has no wheezes. She has no rales.  Abdominal: She exhibits no distension.  Musculoskeletal: She exhibits no edema.  Neurological: She is alert and oriented to person, place, and time. She exhibits normal muscle tone.  Skin: Skin is warm. Capillary refill takes less than 2 seconds.  Psychiatric: She has a normal mood and affect.  Nursing note and vitals reviewed.    ED Treatments / Results  Labs (all labs ordered are listed, but only abnormal results are displayed) Labs Reviewed - No data to display  EKG None  Radiology Ct Head Wo Contrast  Result Date: 10/12/2018 CLINICAL DATA:  72 y/o F; fall in driveway. Laceration to the back of head and right-sided forehead. Facial bruising. EXAM: CT HEAD WITHOUT CONTRAST CT MAXILLOFACIAL WITHOUT CONTRAST CT CERVICAL SPINE WITHOUT CONTRAST TECHNIQUE: Multidetector CT imaging of the head, cervical spine, and maxillofacial structures were performed using the standard protocol without intravenous  contrast. Multiplanar CT image reconstructions of the cervical spine and maxillofacial structures were also generated. COMPARISON:  12/30/2005 MRI of the cervical spine and MRI of the head. FINDINGS: CT HEAD FINDINGS Brain: No evidence of acute infarction, hemorrhage, hydrocephalus, extra-axial collection or mass lesion/mass effect. Vascular: No hyperdense vessel or unexpected calcification. Skull: Mild contusion of right frontal scalp, right periorbital compartment, and right facial superficial soft tissues. No large hematoma. No calvarial fracture. Other: None. CT MAXILLOFACIAL FINDINGS Osseous: No fracture or mandibular dislocation. No destructive process. Orbits: Negative. No traumatic or inflammatory finding. Sinuses: Clear. Soft tissues: Mild contusion of right frontal scalp, right periorbital compartment, and right facial superficial soft tissues. CT CERVICAL SPINE FINDINGS Alignment: Minimal grade 1 anterolisthesis at C2-3, C3-4 and C7-T1. Skull base and vertebrae: No acute fracture. No primary bone lesion or focal pathologic process. Soft tissues and spinal canal: No prevertebral fluid or swelling. No visible canal hematoma. Disc levels: Cervical spondylosis with moderate C4-C6 and severe C6-7 discogenic degenerative changes. Cervical facet hypertrophy predominantly left upper. No high-grade bony canal stenosis. Uncovertebral and facet hypertrophy results in bilateral C3-4, bilateral C4-5, bilateral C5-6, and bilateral C6-7 foraminal stenosis. Upper chest: Negative. Other: Calcific atherosclerosis of carotid bifurcations. IMPRESSION: 1. Mild contusion of right frontal scalp as well as right periorbital and right facial superficial soft tissues. No large hematoma. 2. No calvarial, facial, or cervical fracture identified. 3. No acute intracranial abnormality. 4. Moderate cervical spondylosis greatest at C4-C7 levels. Electronically Signed   By: Mia [place]  [place] M.D.   On: 10/12/2018 19:38   Ct  Cervical Spine Wo Contrast  Result Date: 10/12/2018 CLINICAL DATA:  72 y/o F; fall in driveway. Laceration to the back of head and right-sided forehead. Facial bruising. EXAM: CT HEAD WITHOUT CONTRAST CT MAXILLOFACIAL WITHOUT CONTRAST CT CERVICAL SPINE WITHOUT CONTRAST TECHNIQUE: Multidetector CT imaging of the head, cervical spine, and maxillofacial structures were performed using the standard protocol without intravenous contrast. Multiplanar CT image reconstructions of the cervical spine and maxillofacial structures were also generated. COMPARISON:  12/30/2005 MRI of the cervical spine and MRI of the head. FINDINGS: CT HEAD FINDINGS Brain: No evidence of acute infarction, hemorrhage, hydrocephalus, extra-axial collection or mass lesion/mass effect. Vascular: No hyperdense vessel or unexpected calcification. Skull: Mild contusion of right frontal scalp, right periorbital compartment, and right facial superficial soft tissues. No large hematoma. No calvarial fracture. Other: None. CT MAXILLOFACIAL FINDINGS Osseous: No fracture or mandibular dislocation. No destructive process. Orbits: Negative. No traumatic or inflammatory finding. Sinuses: Clear. Soft tissues: Mild contusion of right frontal scalp, right periorbital compartment, and right facial superficial soft tissues. CT CERVICAL SPINE FINDINGS Alignment: Minimal grade 1 anterolisthesis at C2-3, C3-4 and C7-T1. Skull base and vertebrae: No acute fracture. No primary bone lesion or focal pathologic process. Soft tissues and spinal canal: No prevertebral fluid or swelling. No visible canal hematoma. Disc levels: Cervical spondylosis with moderate C4-C6 and severe C6-7 discogenic degenerative changes. Cervical facet hypertrophy predominantly left upper. No high-grade bony canal stenosis. Uncovertebral and facet hypertrophy results in bilateral C3-4, bilateral C4-5, bilateral C5-6, and bilateral C6-7 foraminal stenosis. Upper chest: Negative. Other: Calcific  atherosclerosis of carotid bifurcations. IMPRESSION: 1. Mild contusion of right frontal scalp as well as right periorbital and right facial superficial soft tissues. No large hematoma. 2. No calvarial, facial, or cervical fracture identified. 3. No acute intracranial abnormality. 4. Moderate cervical spondylosis greatest at C4-C7 levels. Electronically Signed   By: Kristine Garbe M.D.   On: 10/12/2018 19:38   Ct Maxillofacial Wo Contrast  Result Date: 10/12/2018 CLINICAL DATA:  72 y/o F; fall in driveway. Laceration to the back of head and right-sided forehead. Facial bruising. EXAM: CT HEAD WITHOUT CONTRAST CT MAXILLOFACIAL WITHOUT CONTRAST CT CERVICAL SPINE WITHOUT CONTRAST TECHNIQUE: Multidetector CT imaging of the head, cervical spine, and maxillofacial structures were performed using the standard protocol without intravenous contrast. Multiplanar CT image reconstructions of the cervical spine and maxillofacial structures were also generated. COMPARISON:  12/30/2005 MRI of the cervical spine and MRI of the head. FINDINGS: CT HEAD FINDINGS Brain: No evidence of acute infarction, hemorrhage, hydrocephalus, extra-axial collection or mass lesion/mass effect. Vascular: No hyperdense vessel or unexpected calcification. Skull: Mild contusion of right frontal scalp, right periorbital compartment, and right facial superficial soft tissues. No large hematoma. No calvarial fracture. Other: None. CT MAXILLOFACIAL FINDINGS Osseous: No fracture or mandibular dislocation. No destructive process. Orbits: Negative. No traumatic or inflammatory finding. Sinuses: Clear. Soft tissues: Mild contusion of right frontal scalp, right periorbital compartment, and right facial superficial soft tissues. CT CERVICAL SPINE FINDINGS Alignment: Minimal grade 1 anterolisthesis at C2-3, C3-4 and C7-T1. Skull base and vertebrae: No acute fracture. No primary bone lesion or focal pathologic process. Soft tissues and spinal canal: No  prevertebral fluid or swelling. No visible canal hematoma. Disc levels: Cervical spondylosis with moderate C4-C6 and severe C6-7 discogenic degenerative changes. Cervical facet hypertrophy predominantly left upper. No high-grade bony canal stenosis. Uncovertebral and facet hypertrophy results in bilateral C3-4, bilateral C4-5, bilateral C5-6, and bilateral  C6-7 foraminal stenosis. Upper chest: Negative. Other: Calcific atherosclerosis of carotid bifurcations. IMPRESSION: 1. Mild contusion of right frontal scalp as well as right periorbital and right facial superficial soft tissues. No large hematoma. 2. No calvarial, facial, or cervical fracture identified. 3. No acute intracranial abnormality. 4. Moderate cervical spondylosis greatest at C4-C7 levels. Electronically Signed   By: Kristine Garbe M.D.   On: 10/12/2018 19:38    Procedures .Marland KitchenLaceration Repair Date/Time: 10/12/2018 9:17 PM Performed by: Duffy Bruce, MD Authorized by: Duffy Bruce, MD   Consent:    Consent obtained:  Verbal   Consent given by:  Patient   Risks discussed:  Infection, need for additional repair, pain, tendon damage, retained foreign body, vascular damage, poor cosmetic result, poor wound healing and nerve damage   Alternatives discussed:  Referral and delayed treatment Anesthesia (see MAR for exact dosages):    Anesthesia method:  Local infiltration   Local anesthetic:  Lidocaine 1% WITH epi Laceration details:    Location: Right forehead.   Length (cm):  1 Repair type:    Repair type:  Simple Pre-procedure details:    Preparation:  Patient was prepped and draped in usual sterile fashion and imaging obtained to evaluate for foreign bodies Exploration:    Hemostasis achieved with:  Direct pressure   Wound exploration: wound explored through full range of motion and entire depth of wound probed and visualized     Contaminated: no   Treatment:    Area cleansed with:  Betadine   Amount of cleaning:   Extensive   Irrigation solution:  Sterile water   Irrigation method:  Pressure wash Skin repair:    Repair method:  Sutures   Suture size:  6-0   Suture material:  Prolene   Suture technique:  Simple interrupted   Number of sutures:  7 (2,2, and 3, with 3 on superior most laceration/abrasion) Approximation:    Approximation:  Close Post-procedure details:    Dressing:  Antibiotic ointment   Patient tolerance of procedure:  Tolerated well, no immediate complications   (including critical care time)  Medications Ordered in ED Medications  lidocaine-EPINEPHrine (XYLOCAINE W/EPI) 2 %-1:200000 (PF) injection 10 mL (10 mLs Intradermal Given by Other 10/12/18 1730)     Initial Impression / Assessment and Plan / ED Course  I have reviewed the triage vital signs and the nursing notes.  Pertinent labs & imaging results that were available during my care of the patient were reviewed by me and considered in my medical decision making (see chart for details).     Very pleasant 72 year old here with lacerations and contusion to the right temporal area.  CT imaging negative for bony fracture.  She has chronic cervical degeneration but has no upper extremity numbness, tingling, paresthesias, or evidence to suggest central cord syndrome or cord compression.  The patient is otherwise hemodynamically stable and well-appearing.  She is at her mental baseline.  Tetanus is up-to-date.  Will discharge with outpatient follow-up, antibiotic ointment, good return precautions.  Final Clinical Impressions(s) / ED Diagnoses   Final diagnoses:  Fall, initial encounter  Facial laceration, initial encounter  Contusion of face, initial encounter    ED Discharge Orders    None       Duffy Bruce, MD 10/12/18 2118

## 2018-10-12 NOTE — Discharge Instructions (Addendum)
It was a pleasure meeting you!  I recommend bacitracin or OTC antibiotic cream 2-3 x daily, liberally, to help with wound healing.  Sutures should come out in 3-5 days.  Watch for any signs of infection  No swimming or submerging your head for the next 1 week. Showers are ok.

## 2018-10-17 ENCOUNTER — Telehealth: Payer: Self-pay | Admitting: Internal Medicine

## 2018-10-17 NOTE — Telephone Encounter (Signed)
Spoke with patient and appointment scheduled 

## 2018-10-17 NOTE — Telephone Encounter (Signed)
Copied from [place] [id number]. Topic: Quick Communication - See Telephone Encounter >> Oct 17, 2018  3:55 PM Adelene Idler wrote: Patient was seen in ER on 10/12/2018 she was suppose to come in 3-5days for a suture removal patient hasnt scheduled an appt but is requesting one due to right sided headache and congestion.

## 2018-10-17 NOTE — Telephone Encounter (Signed)
Per Dr Regis Bill please have her come in at 1030am Thursday 10/18/18

## 2018-10-18 ENCOUNTER — Ambulatory Visit (INDEPENDENT_AMBULATORY_CARE_PROVIDER_SITE_OTHER): Payer: Medicare Other | Admitting: Internal Medicine

## 2018-10-18 ENCOUNTER — Encounter: Payer: Self-pay | Admitting: Internal Medicine

## 2018-10-18 VITALS — BP 112/62 | HR 72 | Temp 97.7°F | Wt 246.3 lb

## 2018-10-18 DIAGNOSIS — Z9181 History of falling: Secondary | ICD-10-CM | POA: Diagnosis not present

## 2018-10-18 DIAGNOSIS — I2510 Atherosclerotic heart disease of native coronary artery without angina pectoris: Secondary | ICD-10-CM | POA: Diagnosis not present

## 2018-10-18 DIAGNOSIS — Z4802 Encounter for removal of sutures: Secondary | ICD-10-CM

## 2018-10-18 DIAGNOSIS — S0990XS Unspecified injury of head, sequela: Secondary | ICD-10-CM

## 2018-10-18 NOTE — Progress Notes (Signed)
Chief Complaint  Patient presents with  . Follow-up    Suture removal left forehead. Pt states that her vision is improved, pain is pretty much gone. Pt still having some pain in eye when looking up and tender to touch.     HPI: Terri Rodriguez 72 y.o.  Here  After fall trip on cement  Feel on right face picture noted   No loc but lots of brushing and neg x rays   7 sutures  Right forehead  Noted  Ha sore on right side but no other .  Eyes can hurt when elevaetd  But no   Vision change  ROS: See pertinent positives and negatives per HPI.  Past Medical History:  Diagnosis Date  . ALLERGIC RHINITIS   . Anginal pain ([place])   . Cholelithiasis 06/09/08  . Complication of anesthesia 2012   went to ICU after bilateral knees ,? unstable vital signs  . Diverticulitis   . EPICONDYLITIS, LATERAL    osteoarthritis, previous joint replacements  . GERD   . Hepatic hemangioma 06/09/08  . Hiatal hernia 03/2000  . Hx of adenomatous polyp of colon 06/05/08  . Hx of cardiac catheterization 2008    clean coronarys DrKelly   . Hx of chest pain    neg cath remot hx of narrowwing lad in 2003 nl 2008, none in many years  . HYPERLIPIDEMIA   . IBS (irritable bowel syndrome)   . LEUKOPENIA, CHRONIC   . Mononucleosis 1963  . Nasal injury 06/14/2012  . RAYNAUD'S SYNDROME, HX OF    improved after cardiac meds initiated  . Rosacea    facial  . SLEEP APNEA, OBSTRUCTIVE    cpap, settings "automatic" settings 11-12  . Subacute thyroiditis   . UNSPECIFIED ANEMIA     Family History  Problem Relation Age of Onset  . Rheum arthritis Mother   . Diabetes Mother   . Heart failure Mother   . Thrombocytopenia Mother   . Sleep apnea Mother   . Ulcers Mother        PUD  . Deep vein thrombosis Father   . Pulmonary embolism Father   . Osteoarthritis Father   . Atrial fibrillation Father   . Dementia Father   . Sleep apnea Father   . Sleep apnea Brother   . Obesity Brother   . Sleep apnea Brother   . Obesity  Brother   . Colon cancer Maternal Aunt 59  . Colon cancer Maternal Aunt 90  . Pancreatic cancer Unknown   . Uterine cancer Unknown   . Leukemia Unknown   . Inflammatory bowel disease Unknown        aunt  . Congenital adrenal hyperplasia Grandchild   . Atrial fibrillation Brother     Social History   Socioeconomic History  . Marital status: Married    Spouse name: Not on file  . Number of children: 1  . Years of education: Not on file  . Highest education level: Not on file  Occupational History  . Occupation: Physician  Social Needs  . Financial resource strain: Not on file  . Food insecurity:    Worry: Not on file    Inability: Not on file  . Transportation needs:    Medical: Not on file    Non-medical: Not on file  Tobacco Use  . Smoking status: Never Smoker  . Smokeless tobacco: Never Used  Substance and Sexual Activity  . Alcohol use: Yes    Alcohol/week:  14.0 standard drinks    Types: 14 Glasses of wine per week    Comment: 1 glass nightly  . Drug use: No  . Sexual activity: Yes    Partners: Male    Birth control/protection: Surgical    Comment: TAH/BSO  Lifestyle  . Physical activity:    Days per week: Not on file    Minutes per session: Not on file  . Stress: Not on file  Relationships  . Social connections:    Talks on phone: Not on file    Gets together: Not on file    Attends religious service: Not on file    Active member of club or organization: Not on file    Attends meetings of clubs or organizations: Not on file    Relationship status: Not on file  Other Topics Concern  . 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    Outpatient Medications Prior to Visit  Medication Sig Dispense Refill  . acetaminophen (TYLENOL) 500 MG tablet Take 1,000 mg by mouth every 6 (six) hours as needed for mild pain or headache.     . Alum Hydroxide-Mag  Carbonate (GAVISCON PO) Take 1 tablet by mouth 4 (four) times daily as needed (heart burn).     Marland Kitchen amLODipine (NORVASC) 5 MG tablet TAKE 1 TABLET BY MOUTH DAILY 30 tablet 10  . amoxicillin (AMOXIL) 500 MG capsule 2,000 mg. 1 hour prior to dental procedures.    Marland Kitchen aspirin 81 MG tablet Take 81 mg by mouth daily.    Marland Kitchen atorvastatin (LIPITOR) 20 MG tablet TAKE 1 TABLET BY MOUTH DAILY AT 6:00 PM 30 tablet 10  . Azelaic Acid (FINACEA) 15 % cream Apply 1 application topically 2 (two) times daily. After skin is thoroughly washed and patted dry, gently but thoroughly massage a thin film of azelaic acid cream into the affected area twice daily, in the morning and evening.    . carvedilol (COREG) 3.125 MG tablet TAKE 1 TABLET BY MOUTH TWICE DAILY WITH A MEAL 60 tablet 8  . celecoxib (CELEBREX) 200 MG capsule TAKE 1 CAPSULE BY MOUTH DAILY 90 capsule 0  . Cholecalciferol (VITAMIN D3) 2000 UNITS TABS Take 1 tablet by mouth daily.    . clindamycin (CLEOCIN T) 1 % external solution 2 (two) times daily as needed.  3  . clobetasol ointment (TEMOVATE) 0.05 % Apply AA of skin BID  0  . diclofenac sodium (VOLTAREN) 1 % GEL Apply 2-4 g topically 4 (four) times daily. As needed for  Arthritis  Pain. 4 Tube 1  . DULoxetine (CYMBALTA) 60 MG capsule TAKE 1 CAPSULE BY MOUTH EVERY EVENING 90 capsule 0  . estradiol (ESTRACE) 0.5 MG tablet TAKE 1/2 TABLET(0.25 MG) BY MOUTH DAILY 45 tablet 3  . fexofenadine (ALLEGRA) 180 MG tablet Take 180 mg by mouth daily. Takes in PM    . fluocinonide (LIDEX) 0.05 % external solution Apply AA on scalp PRN  0  . metroNIDAZOLE (METROGEL) 1 % gel Apply 1 application topically daily. Apply to face    . Multiple Vitamins-Minerals (CENTRUM ADULTS PO) Take 1 tablet by mouth daily.    . nitroGLYCERIN (NITROSTAT) 0.4 MG SL tablet PLACE 1 TABLET UNDER THE TONGUE EVERY 5 MINUTES AS NEEDED FOR CHEST PAIN 25 tablet 3  . omeprazole-sodium bicarbonate (ZEGERID) 40-1100 MG per capsule Take  1 capsule by mouth  daily before breakfast.     . pregabalin (LYRICA) 50 MG capsule Take 1 capsule (50 mg total) by mouth 3 (three) times daily. 270 capsule 1  . ramipril (ALTACE) 10 MG capsule TAKE ONE CAPSULE BY MOUTH DAILY 30 capsule 10  . valACYclovir (VALTREX) 1000 MG tablet Take 2 grams po x 2 doses with fever blisters 30 tablet 1   No facility-administered medications prior to visit.      EXAM:  BP 112/62 (BP Location: Right Arm, Patient Position: Sitting, Cuff Size: Large)   Pulse 72   Temp 97.7 F (36.5 C) (Oral)   Wt 246 lb 4.8 oz (111.7 kg)   BMI 40.99 kg/m   Body mass index is 40.99 kg/m. Here with spouse  Serious bruising and swelling right eye and forehead and cheek dependent  GENERAL: vitals reviewed and listed above, alert, oriented, appears well hydrated and in no acute distress HEENT: , conjunctiva  clear, no obvious abnormalities on inspection of external nose and ears OP : no lesion edema or exudate  MS: moves all extremities without noticeable focal  Abnormality( has sig OA  And left mild ingrowing toenail without  Dc   Neuro grossly non focal   3 areas of sutures  Removed without difficulty  3 2 2    No bleeding or sx of infection   Covered with antibiotic ointment and bandaid  ASSESSMENT AND PLAN:  Discussed the following assessment and plan:  Closed head injury, sequela  Visit for suture removal  History of bad fall Poss mild concussive sx but non focal and doing as well as expected residual trauma local      Expectant management.  To proceed with her  Pt for balance and   Reviewed fall prevention cane on uneven ground etc  -Patient advised to return or notify health care team  if symptoms worsen ,persist or new concerns arise. soak local care ingrowing toenail There are no Patient Instructions on file for this visit.   Standley Brooking.  M.D.

## 2018-10-25 DIAGNOSIS — M545 Low back pain: Secondary | ICD-10-CM | POA: Diagnosis not present

## 2018-11-13 DIAGNOSIS — L821 Other seborrheic keratosis: Secondary | ICD-10-CM | POA: Diagnosis not present

## 2018-11-13 DIAGNOSIS — Z23 Encounter for immunization: Secondary | ICD-10-CM | POA: Diagnosis not present

## 2018-11-13 DIAGNOSIS — L723 Sebaceous cyst: Secondary | ICD-10-CM | POA: Diagnosis not present

## 2018-11-13 DIAGNOSIS — D239 Other benign neoplasm of skin, unspecified: Secondary | ICD-10-CM | POA: Diagnosis not present

## 2018-12-18 DIAGNOSIS — H04123 Dry eye syndrome of bilateral lacrimal glands: Secondary | ICD-10-CM | POA: Diagnosis not present

## 2018-12-18 DIAGNOSIS — H35363 Drusen (degenerative) of macula, bilateral: Secondary | ICD-10-CM | POA: Diagnosis not present

## 2018-12-18 DIAGNOSIS — H524 Presbyopia: Secondary | ICD-10-CM | POA: Diagnosis not present

## 2018-12-18 DIAGNOSIS — H2513 Age-related nuclear cataract, bilateral: Secondary | ICD-10-CM | POA: Diagnosis not present

## 2018-12-18 DIAGNOSIS — H40013 Open angle with borderline findings, low risk, bilateral: Secondary | ICD-10-CM | POA: Diagnosis not present

## 2018-12-18 DIAGNOSIS — H5203 Hypermetropia, bilateral: Secondary | ICD-10-CM | POA: Diagnosis not present

## 2018-12-21 ENCOUNTER — Other Ambulatory Visit: Payer: Self-pay

## 2018-12-21 MED ORDER — CELECOXIB 200 MG PO CAPS: 200.0000 mg | ORAL_CAPSULE | Freq: Every day | ORAL | 1 refills | Status: DC

## 2018-12-21 MED ORDER — DULOXETINE HCL 60 MG PO CPEP: 60.0000 mg | ORAL_CAPSULE | Freq: Every evening | ORAL | 1 refills | Status: DC

## 2018-12-21 NOTE — Telephone Encounter (Signed)
Please adivse pt would like [facility] Last Ov:10/18/18 Last filled :10/02/18

## 2019-02-11 ENCOUNTER — Other Ambulatory Visit: Payer: Self-pay | Admitting: Cardiovascular Disease

## 2019-03-29 ENCOUNTER — Encounter: Payer: Self-pay | Admitting: Gastroenterology

## 2019-03-29 ENCOUNTER — Ambulatory Visit: Payer: Medicare Other | Admitting: Obstetrics & Gynecology

## 2019-04-03 ENCOUNTER — Other Ambulatory Visit: Payer: Self-pay | Admitting: Internal Medicine

## 2019-05-01 ENCOUNTER — Encounter: Payer: Self-pay | Admitting: Gastroenterology

## 2019-05-11 ENCOUNTER — Other Ambulatory Visit: Payer: Self-pay | Admitting: Cardiology

## 2019-05-18 ENCOUNTER — Other Ambulatory Visit: Payer: Self-pay | Admitting: Internal Medicine

## 2019-05-20 NOTE — Telephone Encounter (Signed)
Please advise last visit was November

## 2019-05-20 NOTE — Telephone Encounter (Signed)
Sent in electronically .  

## 2019-05-22 ENCOUNTER — Encounter: Payer: Medicare Other | Admitting: Gastroenterology

## 2019-05-25 ENCOUNTER — Other Ambulatory Visit: Payer: Self-pay | Admitting: Cardiovascular Disease

## 2019-05-31 IMAGING — CT CT MAXILLOFACIAL W/O CM
5 of 14 series · 17 of 47 positions shown, 18 images · non-contrast
Comparison: 12/30/2005 MRI of the cervical spine and MRI of the
head.

CLINICAL DATA: 72 y/o F; fall in driveway. Laceration to the back
of head and right-sided forehead. Facial bruising.

EXAM:
CT HEAD WITHOUT CONTRAST
CT MAXILLOFACIAL WITHOUT CONTRAST
CT CERVICAL SPINE WITHOUT CONTRAST
TECHNIQUE: Multidetector CT imaging of the head, cervical spine, and
maxillofacial structures were performed using the standard protocol
without intravenous contrast. Multiplanar CT image reconstructions
of the cervical spine and maxillofacial structures were also
generated.

[Series 5: head bone · axial · 0.47mm/px · z∈[-132,-24]mm · 4 of 91 slices shown, 5 images]
[im 19/91  brain]
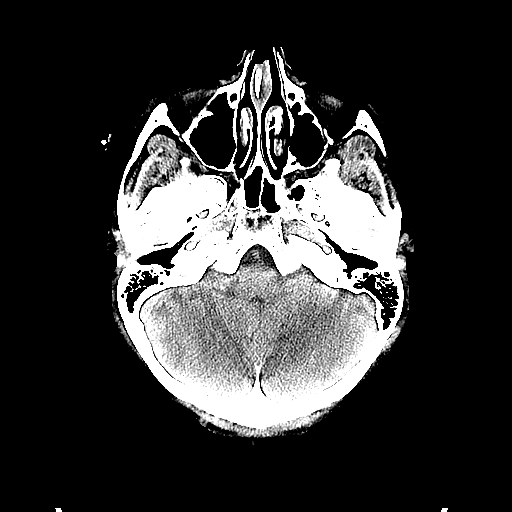
[im 19/91  bone]
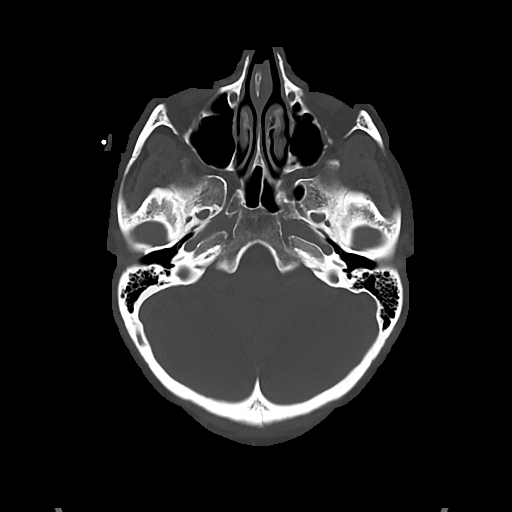
[im 37/91  bone]
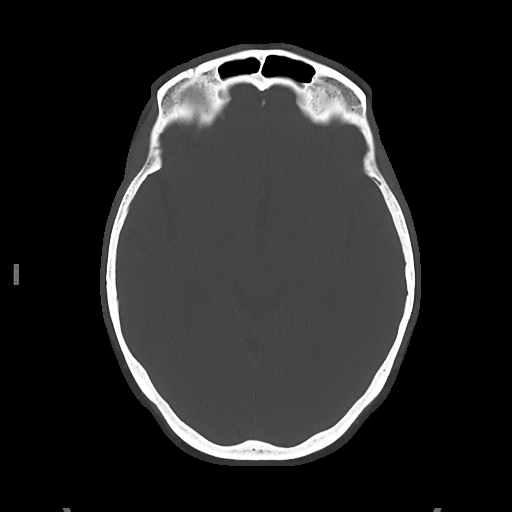
[im 55/91  bone]
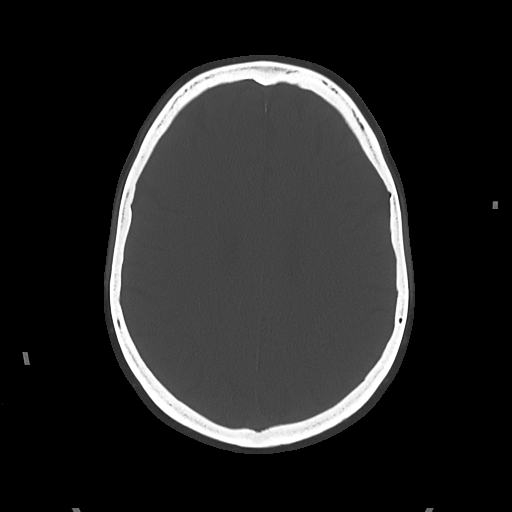
[im 73/91  bone]
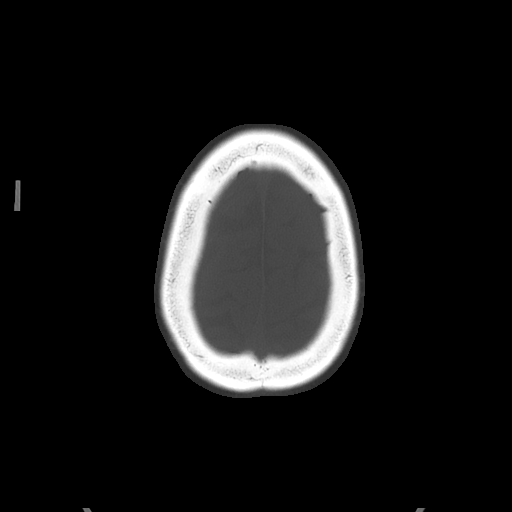

[Series 8: maxilllofacial 2.0 hr40 3 · axial · 0.30mm/px · z∈[-212,-106]mm · 4 of 89 slices shown]
[im 18/89  bone]
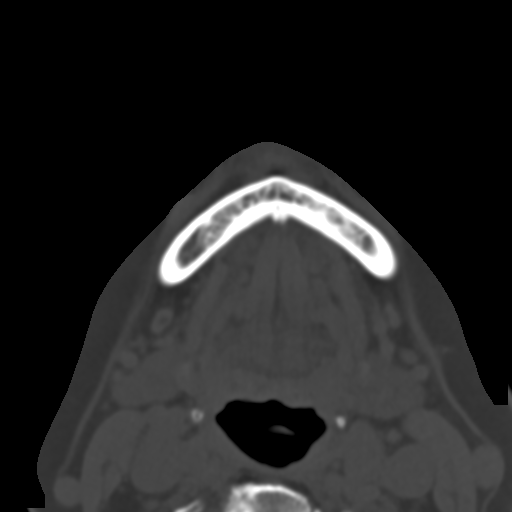
[im 36/89  bone]
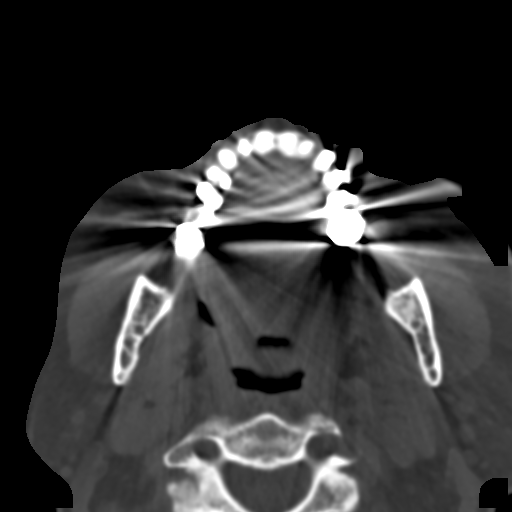
[im 53/89  bone]
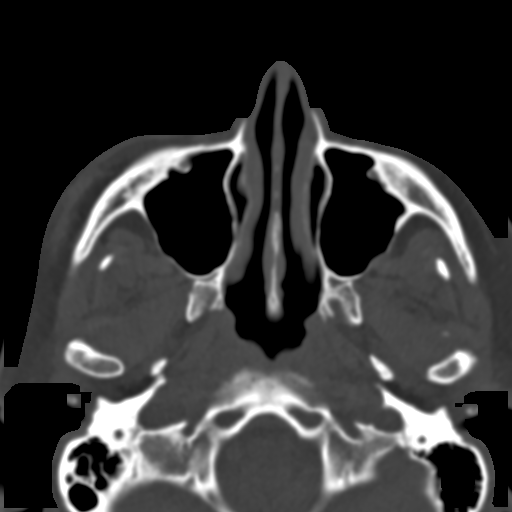
[im 71/89  bone]
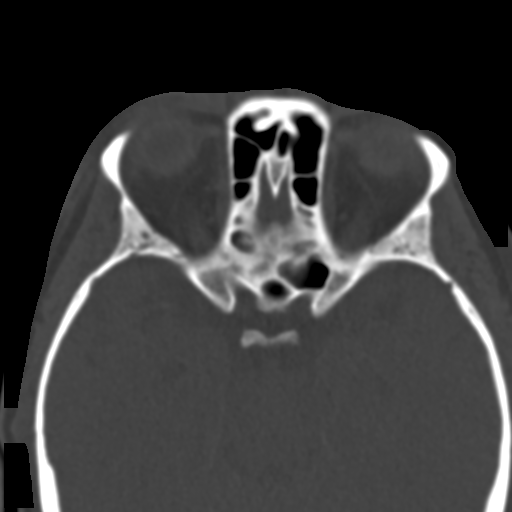

[Series 10: maxilllofacial 2.0 hr59 3 · axial · 0.30mm/px · z∈[-212,-106]mm · 4 of 89 slices shown]
[im 18/89  bone]
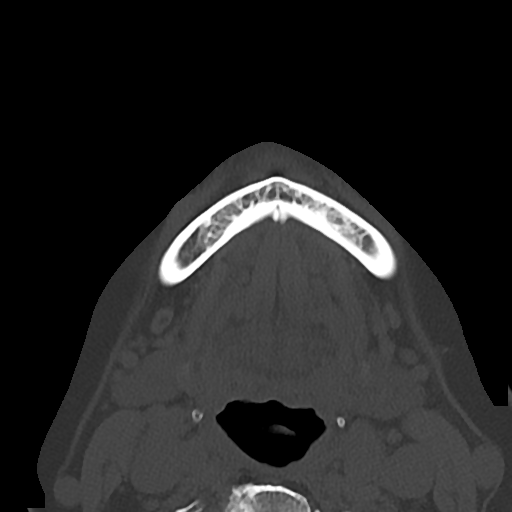
[im 36/89  bone]
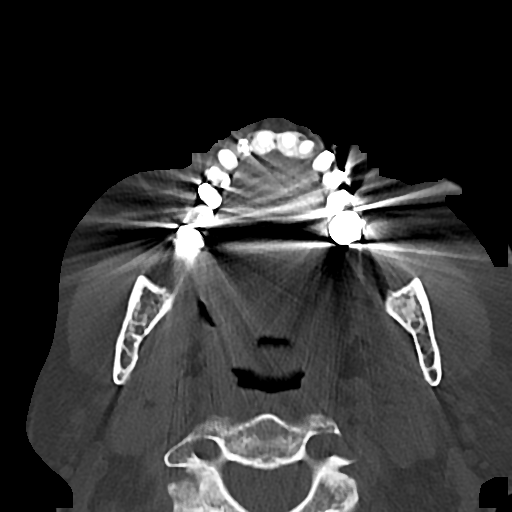
[im 53/89  bone]
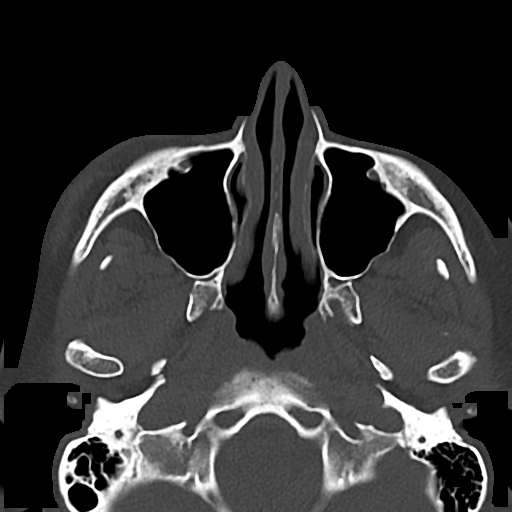
[im 71/89  bone]
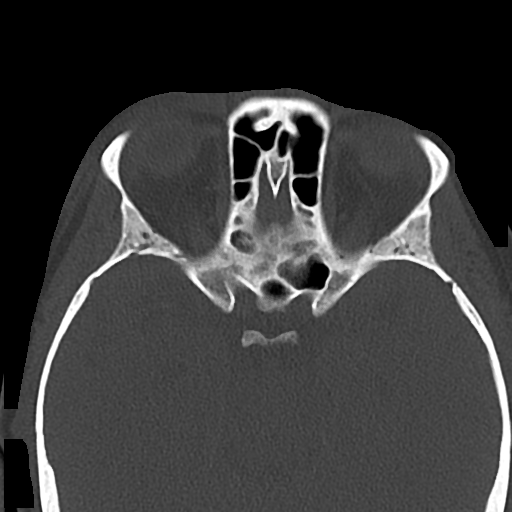

[Series 12: st cor · coronal · 0.35mm/px · 1 of 88 slices shown]
[im 44/88  bone]
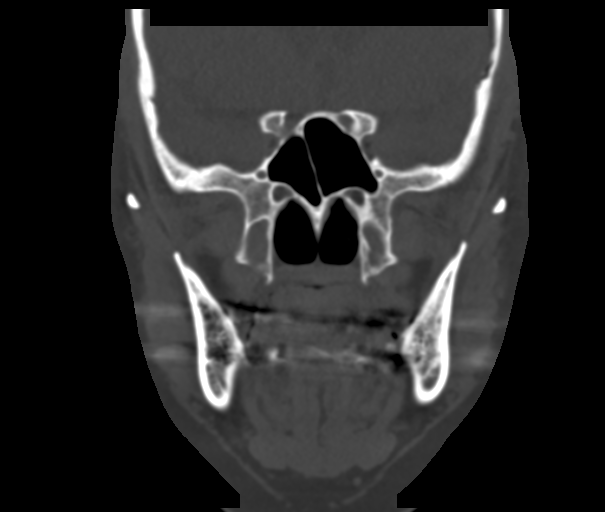

[Series 20: orthogonal axials · axial · 0.21mm/px · z∈[-276,-179]mm · 4 of 91 slices shown]
[im 19/91  bone]
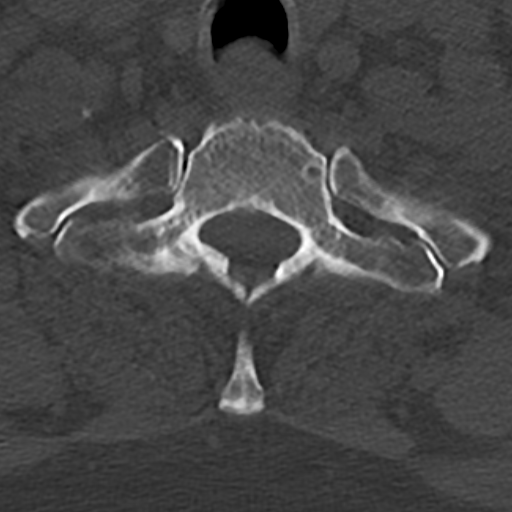
[im 37/91  bone]
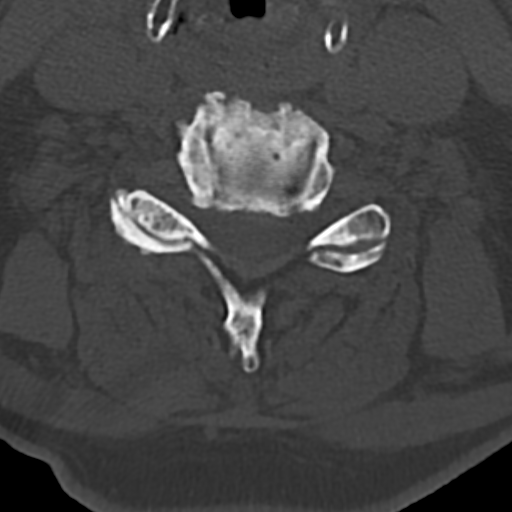
[im 55/91  bone]
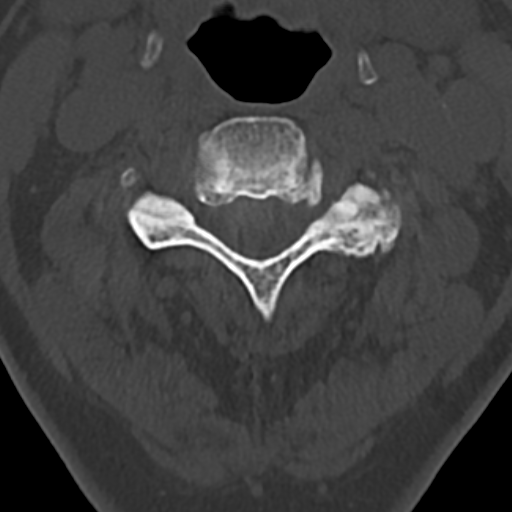
[im 73/91  bone]
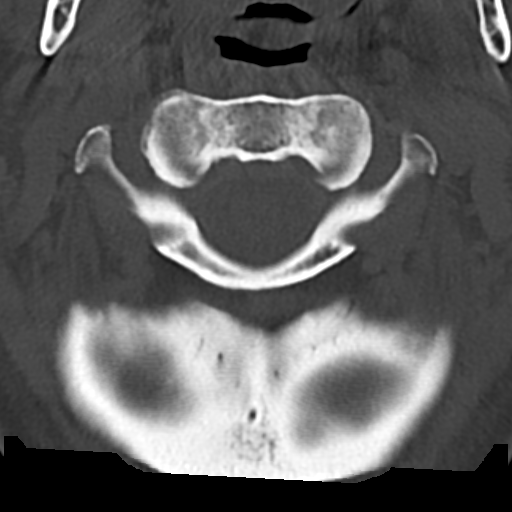

[17 of 47 positions shown; findings below may reference images not displayed]

FINDINGS: CT HEAD FINDINGS

Brain: No evidence of acute infarction, hemorrhage, hydrocephalus,
extra-axial collection or mass lesion/mass effect.

Vascular: No hyperdense vessel or unexpected calcification.

Skull: Mild contusion of right frontal scalp, right periorbital
compartment, and right facial superficial soft tissues. No large
hematoma. No calvarial fracture.

Other: None.

CT MAXILLOFACIAL FINDINGS

Osseous: No fracture or mandibular dislocation. No destructive
process.

Orbits: Negative. No traumatic or inflammatory finding.

Sinuses: Clear.

Soft tissues: Mild contusion of right frontal scalp, right
periorbital compartment, and right facial superficial soft tissues.

CT CERVICAL SPINE FINDINGS

Alignment: Minimal grade 1 anterolisthesis at C2-3, C3-4 and C7-T1.

Skull base and vertebrae: No acute fracture. No primary bone lesion
or focal pathologic process.

Soft tissues and spinal canal: No prevertebral fluid or swelling. No
visible canal hematoma.

Disc levels: Cervical spondylosis with moderate C4-C6 and severe
C6-7 discogenic degenerative changes. Cervical facet hypertrophy
predominantly left upper. No high-grade bony canal stenosis.
Uncovertebral and facet hypertrophy results in bilateral C3-4,
bilateral C4-5, bilateral C5-6, and bilateral C6-7 foraminal
stenosis.

Upper chest: Negative.

Other: Calcific atherosclerosis of carotid bifurcations.
IMPRESSION: 1. Mild contusion of right frontal scalp as well as right
periorbital and right facial superficial soft tissues. No large
hematoma.
2. No calvarial, facial, or cervical fracture identified.
3. No acute intracranial abnormality.
4. Moderate cervical spondylosis greatest at C4-C7 levels.

## 2019-06-07 ENCOUNTER — Other Ambulatory Visit: Payer: Self-pay | Admitting: Internal Medicine

## 2019-06-14 ENCOUNTER — Other Ambulatory Visit: Payer: Self-pay

## 2019-06-17 ENCOUNTER — Ambulatory Visit (INDEPENDENT_AMBULATORY_CARE_PROVIDER_SITE_OTHER): Payer: Medicare Other | Admitting: Obstetrics & Gynecology

## 2019-06-17 ENCOUNTER — Encounter: Payer: Self-pay | Admitting: Obstetrics & Gynecology

## 2019-06-17 ENCOUNTER — Telehealth: Payer: Self-pay | Admitting: *Deleted

## 2019-06-17 ENCOUNTER — Other Ambulatory Visit: Payer: Self-pay

## 2019-06-17 VITALS — BP 104/60 | HR 84 | Temp 97.2°F | Ht 65.25 in | Wt 237.8 lb

## 2019-06-17 DIAGNOSIS — E2839 Other primary ovarian failure: Secondary | ICD-10-CM

## 2019-06-17 DIAGNOSIS — D708 Other neutropenia: Secondary | ICD-10-CM | POA: Diagnosis not present

## 2019-06-17 DIAGNOSIS — Z124 Encounter for screening for malignant neoplasm of cervix: Secondary | ICD-10-CM

## 2019-06-17 DIAGNOSIS — Z01419 Encounter for gynecological examination (general) (routine) without abnormal findings: Secondary | ICD-10-CM | POA: Diagnosis not present

## 2019-06-17 DIAGNOSIS — N750 Cyst of Bartholin's gland: Secondary | ICD-10-CM | POA: Diagnosis not present

## 2019-06-17 DIAGNOSIS — I10 Essential (primary) hypertension: Secondary | ICD-10-CM | POA: Diagnosis not present

## 2019-06-17 DIAGNOSIS — E785 Hyperlipidemia, unspecified: Secondary | ICD-10-CM | POA: Diagnosis not present

## 2019-06-17 DIAGNOSIS — E559 Vitamin D deficiency, unspecified: Secondary | ICD-10-CM

## 2019-06-17 MED ORDER — ESTRADIOL 0.5 MG PO TABS: ORAL_TABLET | ORAL | 3 refills | Status: DC

## 2019-06-17 MED ORDER — VALACYCLOVIR HCL 1 G PO TABS: ORAL_TABLET | ORAL | 1 refills | Status: DC

## 2019-06-17 NOTE — Telephone Encounter (Signed)
Spoke with patient. PUS scheduled for 7/16 at 4pm, consult at 4:30pm with Dr. Sabra Heck. Patient verbalizes understanding.    Routing to provider for final review. Patient is agreeable to disposition. Will close encounter.  Cc: Lerry Liner

## 2019-06-17 NOTE — Patient Instructions (Signed)
Plan to schedule your bone density testing with you next mammogram.  Try to start taking your 1/2 tab of estradiol every other day.

## 2019-06-17 NOTE — Telephone Encounter (Signed)
Call placed to patient. Seen in office today, Dr. Sabra Heck recommended PUS for vulvar mass.  Spoke with patient, she will return call later today to schedule PUS.   Order previously placed.

## 2019-06-17 NOTE — Progress Notes (Signed)
73 y.o. G65P1 Married White or Caucasian female here for annual exam.  States she "is here".  In an isolation group with her daughter, son-in-law, and two grandchildren.  Her two grandchildren are 7 and 1.  Both grandchildren have CAH.    Does need blood work today.  H/o mildly elevated fasting glucose but hba1c levels have been normal.    Denies vaginal bleeding.  Is on HRT, 0.5mg  tab 1/2 tab daily.  She has can tell when she misses a tablet.  We still discussed taping this to every other day.  If tolerates, then I would decreased to every third day and then off.  I think it's time we try to stop the estradiol.  She is comfortable with plan.  She has two vulvar/vaginal bumps she wants me to assess today.  Have been present since march.  Do not seem to be enlarging.    No LMP recorded. Patient has had a hysterectomy.          Sexually active: Yes.    The current method of family planning is status post hysterectomy.    Exercising: No.   Smoker:  no  Health Maintenance: Pap:  2011 Normal  History of abnormal Pap:  no MMG:  08/31/18 BIRADS2:benign  Colonoscopy:  02/05/14 normal. Has appt 07/2019 BMD:   05/09/14 normal TDaP:  2015 Pneumonia vaccine(s):  done Shingrix: Zostavax Hep C testing: 04/26/17 neg  Screening Labs: PCP   reports that she has never smoked. She has never used smokeless tobacco. She reports current alcohol use of about 14.0 standard drinks of alcohol per week. She reports that she does not use drugs.  Past Medical History:  Diagnosis Date  . ALLERGIC RHINITIS   . Anginal pain ([place])   . Cholelithiasis 06/09/08  . Complication of anesthesia 2012   went to ICU after bilateral knees ,? unstable vital signs  . Diverticulitis   . EPICONDYLITIS, LATERAL    osteoarthritis, previous joint replacements  . Fall 10/2018  . GERD   . Hepatic hemangioma 06/09/08  . Hiatal hernia 03/2000  . Hx of adenomatous polyp of colon 06/05/08  . Hx of cardiac catheterization 2008    clean  coronarys DrKelly   . Hx of chest pain    neg cath remot hx of narrowwing lad in 2003 nl 2008, none in many years  . HYPERLIPIDEMIA   . IBS (irritable bowel syndrome)   . LEUKOPENIA, CHRONIC   . Mononucleosis 1963  . Nasal injury 06/14/2012  . RAYNAUD'S SYNDROME, HX OF    improved after cardiac meds initiated  . Rosacea    facial  . SLEEP APNEA, OBSTRUCTIVE    cpap, settings "automatic" settings 11-12  . Subacute thyroiditis   . UNSPECIFIED ANEMIA     Past Surgical History:  Procedure Laterality Date  . ABDOMINAL HYSTERECTOMY    . BACK SURGERY  02/06/2008  . CARDIAC CATHETERIZATION  04/07/2007   Noncritical coronary artery disease. Contiue medical therapy.  Marland Kitchen CARDIAC CATHETERIZATION  12/03/2007   Normal LV function. Mild angiographic mitral valve prolapse. Normal coronary arteries.  Marland Kitchen CARDIOVASCULAR STRESS TEST  11/09/2007   Moderate ischemia in Mid Anterior, Mid Anteroseptal, Apical Anterior, and Apical Septal regions. EKG negative for ischemia.  Marland Kitchen [place]  . dental implants  11/2002,11/2011,11/2012  . DILATION AND CURETTAGE OF UTERUS    . JOINT REPLACEMENT  09/21/2011   bilateral knee  . KNEE ARTHROSCOPY     bilateral  . lumbar  surgery fixation with disc replacement  10/08   Left  . NASAL SEPTOPLASTY W/ TURBINOPLASTY    . NOCTURNAL POLYSOMNOGRAM  12/31/2006   Severe obstructive sleep apnea. AHI-87/hr  . REPLACEMENT TOTAL KNEE     bilateral  . TONSILLECTOMY    . TONSILLECTOMY  1952  . TOTAL HIP ARTHROPLASTY Left 10/21/2013   Procedure: LEFT TOTAL HIP ARTHROPLASTY ANTERIOR APPROACH;  Surgeon: [name], MD;  Location: [facility];  Service: Orthopedics;  Laterality: Left;  . TOTAL HIP ARTHROPLASTY Right 07/16/2014   Procedure: RIGHT TOTAL HIP ARTHROPLASTY ANTERIOR APPROACH;  Surgeon: [name], MD;  Location: [facility];  Service: Orthopedics;  Laterality: Right;  . TRANSTHORACIC ECHOCARDIOGRAM  11/09/2007   EF 68%, LV systolic function normal. Mild  aortic root dilation.  . WRIST SURGERY Left 04/03/14   ORIF "distal radial head fracture"    Current Outpatient Medications  Medication Sig Dispense Refill  . acetaminophen (TYLENOL) 500 MG tablet Take 1,000 mg by mouth every 6 (six) hours as needed for mild pain or headache.     . Alum Hydroxide-Mag Carbonate (GAVISCON PO) Take 1 tablet by mouth 4 (four) times daily as needed (heart burn).     Marland Kitchen amLODipine (NORVASC) 5 MG tablet TAKE 1 TABLET BY MOUTH EVERY DAY 90 tablet 0  . amoxicillin (AMOXIL) 500 MG capsule 2,000 mg. 1 hour prior to dental procedures.    Marland Kitchen aspirin 81 MG tablet Take 81 mg by mouth daily.    Marland Kitchen atorvastatin (LIPITOR) 20 MG tablet TAKE 1 TABLET BY MOUTH EVERY DAY AT 6PM 90 tablet 0  . Azelaic Acid (FINACEA) 15 % cream Apply 1 application topically 2 (two) times daily. After skin is thoroughly washed and patted dry, gently but thoroughly massage a thin film of azelaic acid cream into the affected area twice daily, in the morning and evening.    . carvedilol (COREG) 3.125 MG tablet TAKE 1 TABLET BY MOUTH TWICE A DAY WITH MEALS 60 tablet 4  . celecoxib (CELEBREX) 200 MG capsule TAKE 1 CAPSULE BY MOUTH EVERY DAY 90 capsule 1  . Cholecalciferol (VITAMIN D3) 2000 UNITS TABS Take 1 tablet by mouth daily.    . clindamycin (CLEOCIN T) 1 % external solution 2 (two) times daily as needed.  3  . clobetasol ointment (TEMOVATE) 0.05 % Apply AA of skin BID  0  . diclofenac sodium (VOLTAREN) 1 % GEL Apply 2-4 g topically 4 (four) times daily. As needed for  Arthritis  Pain. 4 Tube 1  . DULoxetine (CYMBALTA) 60 MG capsule TAKE 1 CAPSULE (60 MG TOTAL) BY MOUTH EVERY EVENING. 90 capsule 1  . estradiol (ESTRACE) 0.5 MG tablet TAKE 1/2 TABLET(0.25 MG) BY MOUTH DAILY 45 tablet 3  . fexofenadine (ALLEGRA) 180 MG tablet Take 180 mg by mouth daily. Takes in PM    . fluocinonide (LIDEX) 0.05 % external solution Apply AA on scalp PRN  0  . metroNIDAZOLE (METROGEL) 0.75 % gel APPLY TO AFFECTED AREA TWICE  A DAY    . Multiple Vitamins-Minerals (CENTRUM ADULTS PO) Take 1 tablet by mouth daily.    . nitroGLYCERIN (NITROSTAT) 0.4 MG SL tablet PLACE 1 TABLET UNDER THE TONGUE EVERY 5 MINUTES AS NEEDED FOR CHEST PAIN 25 tablet 3  . omeprazole-sodium bicarbonate (ZEGERID) 40-1100 MG per capsule Take 1 capsule by mouth daily before breakfast.     . pregabalin (LYRICA) 50 MG capsule TAKE 1 CAPSULE BY MOUTH THREE TIMES A DAY 270 capsule 0  .  ramipril (ALTACE) 10 MG capsule TAKE 1 CAPSULE BY MOUTH EVERY DAY 30 capsule 5  . valACYclovir (VALTREX) 1000 MG tablet Take 2 grams po x 2 doses with fever blisters 30 tablet 1   No current facility-administered medications for this visit.     Family History  Problem Relation Age of Onset  . Rheum arthritis Mother   . Diabetes Mother   . Heart failure Mother   . Thrombocytopenia Mother   . Sleep apnea Mother   . Ulcers Mother        PUD  . Deep vein thrombosis Father   . Pulmonary embolism Father   . Osteoarthritis Father   . Atrial fibrillation Father   . Dementia Father   . Sleep apnea Father   . Sleep apnea Brother   . Obesity Brother   . Sleep apnea Brother   . Obesity Brother   . Colon cancer Maternal Aunt 28  . Colon cancer Maternal Aunt 90  . Pancreatic cancer Other   . Uterine cancer Other   . Leukemia Other   . Inflammatory bowel disease Other        aunt  . Congenital adrenal hyperplasia Grandchild   . Atrial fibrillation Brother     Review of Systems  All other systems reviewed and are negative.   Exam:   BP 104/60   Pulse 84   Temp (!) 97.2 F (36.2 C) (Temporal)   Ht 5' 5.25" (1.657 m)   Wt 237 lb 12.8 oz (107.9 kg)   BMI 39.27 kg/m   Height:   Height: 5' 5.25" (165.7 cm)  Ht Readings from Last 3 Encounters:  06/17/19 5' 5.25" (1.657 m)  10/12/18 5\' 5"  (1.651 m)  09/21/18 5\' 6"  (1.676 m)    General appearance: alert, cooperative and appears stated age Head: Normocephalic, without obvious abnormality,  atraumatic Neck: no adenopathy, supple, symmetrical, trachea midline and thyroid normal to inspection and palpation Lungs: clear to auscultation bilaterally Breasts: normal appearance, no masses or tenderness Heart: regular rate and rhythm Abdomen: soft, non-tender; bowel sounds normal; no masses,  no organomegaly Extremities: extremities normal, atraumatic, no cyanosis or edema Skin: Skin color, texture, turgor normal. No rashes or lesions Lymph nodes: Cervical, supraclavicular, and axillary nodes normal. No abnormal inguinal nodes palpated Neurologic: Grossly normal   Pelvic: External genitalia:  no lesions              Urethra:  normal appearing urethra with no masses, tenderness or lesions              Bartholins and Skenes: normal                 Vagina: normal appearing vagina with normal color and discharge, just inside vagina and to the lower right is a small 1.5cm smooth and mobile lesion c/w bartholin's cyst, just beneath is a smaller and more firm lesion that feels more like a lymph node              Cervix: absent              Pap taken: No. Bimanual Exam:  Uterus:  uterus absent              Adnexa: no mass, fullness, tenderness               Rectovaginal: Confirms               Anus:  normal sphincter tone, no lesions  Chaperone was  present for exam.  A:  Well Woman with normal exam PMP, on low dosed HRT H/o colonic polyps OSA GERD Obesity Oral HSV Possible bartholin's cyst with small lymph node inferiorly  P:   Mammogram guidelines reviewed.  Doing yearly 3D. pap smear not indicated PUS will be obtained to assess for cystic finding and to see if the inferior lesion is a lymph node.  D/w pt possible need for removal. RF for estradiol 0.5mg  1/2 tab.  She is going to start taking this every other day and let me know how she does. RF for Vlatrx 1 gm 2 tabs po x 2 doses as needed for sympotms.  #30/1R CBC, CMP, lipids, TSH, Vit D obtained today Colonoscopy due and  she has this scheduled Plan BMD with next MMG return annually or prn

## 2019-06-18 ENCOUNTER — Telehealth: Payer: Self-pay | Admitting: Obstetrics & Gynecology

## 2019-06-18 LAB — COMPREHENSIVE METABOLIC PANEL
ALT: 31 IU/L (ref 0–32)
AST: 41 IU/L — ABNORMAL HIGH (ref 0–40)
Albumin/Globulin Ratio: 2.4 — ABNORMAL HIGH (ref 1.2–2.2)
Albumin: 4.5 g/dL (ref 3.7–4.7)
Alkaline Phosphatase: 85 IU/L (ref 39–117)
BUN/Creatinine Ratio: 33 — ABNORMAL HIGH (ref 12–28)
BUN: 22 mg/dL (ref 8–27)
Bilirubin Total: 0.3 mg/dL (ref 0.0–1.2)
CO2: 24 mmol/L (ref 20–29)
Calcium: 9.5 mg/dL (ref 8.7–10.3)
Chloride: 104 mmol/L (ref 96–106)
Creatinine, Ser: 0.66 mg/dL (ref 0.57–1.00)
GFR calc Af Amer: 101 mL/min/{1.73_m2} (ref >59)
GFR calc non Af Amer: 87 mL/min/{1.73_m2} (ref >59)
Globulin, Total: 1.9 g/dL (ref 1.5–4.5)
Glucose: 86 mg/dL (ref 65–99)
Potassium: 5.1 mmol/L (ref 3.5–5.2)
Sodium: 142 mmol/L (ref 134–144)
Total Protein: 6.4 g/dL (ref 6.0–8.5)

## 2019-06-18 LAB — CBC
Hematocrit: 39 % (ref 34.0–46.6)
Hemoglobin: 13 g/dL (ref 11.1–15.9)
MCH: 33.9 pg — ABNORMAL HIGH (ref 26.6–33.0)
MCHC: 33.3 g/dL (ref 31.5–35.7)
MCV: 102 fL — ABNORMAL HIGH (ref 79–97)
Platelets: 209 10*3/uL (ref 150–450)
RBC: 3.83 x10E6/uL (ref 3.77–5.28)
RDW: 13.8 % (ref 11.7–15.4)
WBC: 4.3 10*3/uL (ref 3.4–10.8)

## 2019-06-18 LAB — VITAMIN D 25 HYDROXY (VIT D DEFICIENCY, FRACTURES): Vit D, 25-Hydroxy: 26.3 ng/mL — ABNORMAL LOW (ref 30.0–100.0)

## 2019-06-18 LAB — LIPID PANEL
Chol/HDL Ratio: 2 ratio (ref 0.0–4.4)
Cholesterol, Total: 155 mg/dL (ref 100–199)
HDL: 78 mg/dL (ref >39)
LDL Calculated: 57 mg/dL (ref 0–99)
Triglycerides: 100 mg/dL (ref 0–149)
VLDL Cholesterol Cal: 20 mg/dL (ref 5–40)

## 2019-06-18 LAB — TSH: TSH: 1.84 u[IU]/mL (ref 0.450–4.500)

## 2019-06-18 NOTE — Telephone Encounter (Signed)
Call placed to patient. Reviewed benefit for scheduled ultrasound appointment. Patient understood information presented. Patient is aware of the appointment date, arrivla time and cancellation policy. No further questions. Will close encounter

## 2019-06-18 NOTE — Telephone Encounter (Signed)
Call placed to patient to review benefit for scheduled ultrasound appointment, Left voicemail message requesting a return call

## 2019-06-18 NOTE — Telephone Encounter (Signed)
Patient returned call

## 2019-06-20 ENCOUNTER — Ambulatory Visit (INDEPENDENT_AMBULATORY_CARE_PROVIDER_SITE_OTHER): Payer: Medicare Other | Admitting: Obstetrics & Gynecology

## 2019-06-20 ENCOUNTER — Other Ambulatory Visit: Payer: Self-pay

## 2019-06-20 ENCOUNTER — Ambulatory Visit (INDEPENDENT_AMBULATORY_CARE_PROVIDER_SITE_OTHER): Payer: Medicare Other

## 2019-06-20 VITALS — BP 120/60 | HR 74 | Temp 97.3°F | Ht 65.25 in | Wt 241.0 lb

## 2019-06-20 DIAGNOSIS — N9089 Other specified noninflammatory disorders of vulva and perineum: Secondary | ICD-10-CM

## 2019-06-20 DIAGNOSIS — N750 Cyst of Bartholin's gland: Secondary | ICD-10-CM

## 2019-06-20 NOTE — Progress Notes (Signed)
73 yo G3P1 MWF with hx of probable Bartholin's cyst with smaller nodule noted on vaginal exam here for PUS of this area to see if the area appears cyst or solid on ultrasound.  If there is cystic appearance, will try to I&D or obtained tissue biopsy.     No LMP recorded. Patient has had a hysterectomy.  Contraception: PMP  Findings:  Right vulvar cystic mass measured 16 x 72m, avascular with internal debris, possible solid component  Uterus is surgically absent  No adnexal masses noted.  Discussion:  Due to findings, I&D recommended to ensure this is cystic.  Verbal consent obtained.    Area cleansed with Betadine x 3.  1.5cc 1% Lidocaine instilled.  Using #11 blade, lesion pierced but no drainage noted.  No further procedure attempted.  Assessment:  Bartholin's mass/cyst/solid component  Plan:  As I cannot confirm on ultrasound and procedure that this is indeed cystic, feel surgical removal is only option.  Procedure reviewed.  Feel this can likely be done in outpatient OR under MAC or LMA.  Infection, bleeding, rare risk of bowel injury and risk of needing future procedures depending on pathology.  She is willing to proceed.  The only other possible imaging that can be done is MRI but this may not give a clear answer either and then we would be back at this same place.  She understands and is ready to proceed.  ~25 minutes spent with patient >50% of time was in face to face discussion of above.

## 2019-06-23 ENCOUNTER — Encounter: Payer: Self-pay | Admitting: Obstetrics & Gynecology

## 2019-06-25 ENCOUNTER — Other Ambulatory Visit: Payer: Self-pay

## 2019-06-25 ENCOUNTER — Ambulatory Visit (AMBULATORY_SURGERY_CENTER): Payer: Self-pay

## 2019-06-25 VITALS — Ht 65.5 in | Wt 230.0 lb

## 2019-06-25 DIAGNOSIS — Z86010 Personal history of colonic polyps: Secondary | ICD-10-CM

## 2019-06-25 MED ORDER — NA SULFATE-K SULFATE-MG SULF 17.5-3.13-1.6 GM/177ML PO SOLN: 1.0000 | Freq: Once | ORAL | 0 refills | Status: AC

## 2019-06-25 NOTE — Progress Notes (Signed)
Denies allergies to eggs or soy products. Denies complication of anesthesia or sedation. Denies use of weight loss medication. Denies use of O2.   Emmi instructions given for colonoscopy.  Pre-Visit was conducted by phone due to Covid 19. Instructions were reviewed and mailed to patients confirmed address. Patient was encouraged to call if she had any questions regarding instructions.

## 2019-07-02 ENCOUNTER — Telehealth: Payer: Self-pay | Admitting: Gastroenterology

## 2019-07-02 MED ORDER — SUPREP BOWEL PREP KIT 17.5-3.13-1.6 GM/177ML PO SOLN: 1.0000 | ORAL | 0 refills | Status: DC

## 2019-07-02 NOTE — Telephone Encounter (Signed)
Suprep sent to pharmacy 

## 2019-07-04 ENCOUNTER — Telehealth: Payer: Self-pay | Admitting: Obstetrics & Gynecology

## 2019-07-04 NOTE — Telephone Encounter (Signed)
Call placed to patient to review benefits for surgical procedure. Left voicemail message requesting a return call   cc: Lamont Snowball, RN

## 2019-07-08 ENCOUNTER — Telehealth: Payer: Self-pay | Admitting: Gastroenterology

## 2019-07-08 NOTE — Telephone Encounter (Signed)

## 2019-07-08 NOTE — Telephone Encounter (Signed)
No to all answer °

## 2019-07-09 ENCOUNTER — Ambulatory Visit (AMBULATORY_SURGERY_CENTER): Payer: Medicare Other | Admitting: Gastroenterology

## 2019-07-09 ENCOUNTER — Other Ambulatory Visit: Payer: Self-pay

## 2019-07-09 ENCOUNTER — Encounter: Payer: Self-pay | Admitting: Gastroenterology

## 2019-07-09 VITALS — BP 113/58 | HR 70 | Temp 98.5°F | Resp 13 | Ht 65.5 in | Wt 230.0 lb

## 2019-07-09 DIAGNOSIS — Z86010 Personal history of colonic polyps: Secondary | ICD-10-CM | POA: Diagnosis not present

## 2019-07-09 DIAGNOSIS — I2510 Atherosclerotic heart disease of native coronary artery without angina pectoris: Secondary | ICD-10-CM | POA: Diagnosis not present

## 2019-07-09 DIAGNOSIS — Z538 Procedure and treatment not carried out for other reasons: Secondary | ICD-10-CM

## 2019-07-09 DIAGNOSIS — I10 Essential (primary) hypertension: Secondary | ICD-10-CM | POA: Diagnosis not present

## 2019-07-09 DIAGNOSIS — G4733 Obstructive sleep apnea (adult) (pediatric): Secondary | ICD-10-CM | POA: Diagnosis not present

## 2019-07-09 MED ORDER — SODIUM CHLORIDE 0.9 % IV SOLN: 500.0000 mL | Freq: Once | INTRAVENOUS | Status: DC

## 2019-07-09 NOTE — Progress Notes (Signed)
Report to PACU, RN, vss, BBS= Clear.  

## 2019-07-09 NOTE — Op Note (Addendum)
[place] Patient Name: Terri Rodriguez Procedure Date: 07/09/2019 8:42 AM MRN: [id number] Endoscopist: Thornton Park MD, MD Age: 73 Referring MD:  Date of Birth: May 28, 1946 Gender: Female Account #: [id number] Procedure:                Colonoscopy Indications:              Surveillance: Personal history of adenomatous                            polyps on last colonoscopy 5 years ago                           Multiple maternal aunts with colon cancer over age                            63                           History of colon polyps (tubular adenoma and                            sessile serrated adenoma 2007; tubular adenoma 2009)                           Multiple prior colonoscopies: 21995, 2001, 2007,                            2009, 2011, 2015 Medicines:                See the Anesthesia note for documentation of the                            administered medications Procedure:                Pre-Anesthesia Assessment:                           - Prior to the procedure, a History and Physical                            was performed, and patient medications and                            allergies were reviewed. The patient's tolerance of                            previous anesthesia was also reviewed. The risks                            and benefits of the procedure and the sedation                            options and risks were discussed with the patient.                            All questions  were answered, and informed consent                            was obtained. Prior Anticoagulants: The patient has                            taken no previous anticoagulant or antiplatelet                            agents. ASA Grade Assessment: II - A patient with                            mild systemic disease. After reviewing the risks                            and benefits, the patient was deemed in                            satisfactory condition to  undergo the procedure.                           After obtaining informed consent, the colonoscope                            was passed under direct vision. Throughout the                            procedure, the patient's blood pressure, pulse, and                            oxygen saturations were monitored continuously. The                            Colonoscope was introduced through the anus and                            advanced to the the cecum, identified by                            appendiceal orifice and ileocecal valve. The                            colonoscopy was performed with moderate difficulty                            due to a redundant colon and significant looping.                            Successful completion of the procedure was aided by                            applying abdominal pressure. The patient tolerated  the procedure well. The quality of the bowel                            preparation was inadequate. The appendiceal orifice                            was photographed. Scope In: 8:49:37 AM Scope Out: 9:01:34 AM Scope Withdrawal Time: 0 hours 5 minutes 37 seconds  Total Procedure Duration: 0 hours 11 minutes 57 seconds  Findings:                 The perianal and digital rectal examinations were                            normal.                           A moderate amount of stool was found in the entire                            colon, precluding visualization. Lavage of the area                            was performed using greater than 500 mL, resulting                            in incomplete clearance with continued poor                            visualization. Complications:            No immediate complications. Estimated Blood Loss:     Estimated blood loss: none. Impression:               - Preparation of the colon was inadequate. I was                            unable to assess for large or small polyps.                            - Stool in the entire examined colon. No meaningful                            evaluation performed today.                           - No specimens collected. Recommendation:           Clear liquid diet today.                           Continue bowel prep today with full Suprep prep.                           Repeat colonoscopy tomorrow. Thornton Park MD, MD 07/09/2019 9:09:33 AM This report has been signed electronically.

## 2019-07-09 NOTE — Patient Instructions (Signed)
Discharge instructions given. Repeat colonoscopy tomorrow. Clear liquid diet today. suprep tonight. Resume current medications. YOU HAD AN ENDOSCOPIC PROCEDURE TODAY AT [facility]:   Refer to the procedure report that was given to you for any specific questions about what was found during the examination.  If the procedure report does not answer your questions, please call your gastroenterologist to clarify.  If you requested that your care partner not be given the details of your procedure findings, then the procedure report has been included in a sealed envelope for you to review at your convenience later.  YOU SHOULD EXPECT: Some feelings of bloating in the abdomen. Passage of more gas than usual.  Walking can help get rid of the air that was put into your GI tract during the procedure and reduce the bloating. If you had a lower endoscopy (such as a colonoscopy or flexible sigmoidoscopy) you may notice spotting of blood in your stool or on the toilet paper. If you underwent a bowel prep for your procedure, you may not have a normal bowel movement for a few days.  Please Note:  You might notice some irritation and congestion in your nose or some drainage.  This is from the oxygen used during your procedure.  There is no need for concern and it should clear up in a day or so.  SYMPTOMS TO REPORT IMMEDIATELY:   Following lower endoscopy (colonoscopy or flexible sigmoidoscopy):  Excessive amounts of blood in the stool  Significant tenderness or worsening of abdominal pains  Swelling of the abdomen that is new, acute  Fever of 100F or higher   For urgent or emergent issues, a gastroenterologist can be reached at any hour by calling [phone].   DIET:  We do recommend a small meal at first, but then you may proceed to your regular diet.  Drink plenty of fluids but you should avoid alcoholic beverages for 24 hours.  ACTIVITY:  You should plan to take it easy for the rest  of today and you should NOT DRIVE or use heavy machinery until tomorrow (because of the sedation medicines used during the test).    FOLLOW UP: Our staff will call the number listed on your records 48-72 hours following your procedure to check on you and address any questions or concerns that you may have regarding the information given to you following your procedure. If we do not reach you, we will leave a message.  We will attempt to reach you two times.  During this call, we will ask if you have developed any symptoms of COVID 19. If you develop any symptoms (ie: fever, flu-like symptoms, shortness of breath, cough etc.) before then, please call [phone].  If you test positive for Covid 19 in the 2 weeks post procedure, please call and report this information to [place].    If any biopsies were taken you will be contacted by phone or by letter within the next 1-3 weeks.  Please call us at [phone] if you have not heard about the biopsies in 3 weeks.    SIGNATURES/CONFIDENTIALITY: You and/or your care partner have signed paperwork which will be entered into your electronic medical record.  These signatures attest to the fact that that the information above on your After Visit Summary has been reviewed and is understood.  Full responsibility of the confidentiality of this discharge information lies with you and/or your care-partner.

## 2019-07-09 NOTE — Progress Notes (Signed)
Pt's states no medical or surgical changes since previsit or office visit.  [facility] took temp and [name] took vitals.

## 2019-07-10 ENCOUNTER — Encounter: Payer: Self-pay | Admitting: Gastroenterology

## 2019-07-10 ENCOUNTER — Ambulatory Visit (AMBULATORY_SURGERY_CENTER): Payer: Medicare Other | Admitting: Gastroenterology

## 2019-07-10 ENCOUNTER — Other Ambulatory Visit: Payer: Self-pay

## 2019-07-10 ENCOUNTER — Encounter: Payer: Medicare Other | Admitting: Gastroenterology

## 2019-07-10 VITALS — BP 127/65 | HR 76 | Temp 98.5°F | Resp 20 | Ht 65.5 in | Wt 230.0 lb

## 2019-07-10 DIAGNOSIS — K621 Rectal polyp: Secondary | ICD-10-CM

## 2019-07-10 DIAGNOSIS — D122 Benign neoplasm of ascending colon: Secondary | ICD-10-CM

## 2019-07-10 DIAGNOSIS — Z86010 Personal history of colonic polyps: Secondary | ICD-10-CM

## 2019-07-10 DIAGNOSIS — D128 Benign neoplasm of rectum: Secondary | ICD-10-CM

## 2019-07-10 DIAGNOSIS — Z1211 Encounter for screening for malignant neoplasm of colon: Secondary | ICD-10-CM | POA: Diagnosis not present

## 2019-07-10 MED ORDER — SODIUM CHLORIDE 0.9 % IV SOLN: 500.0000 mL | Freq: Once | INTRAVENOUS | Status: DC

## 2019-07-10 NOTE — Op Note (Signed)
[place] Patient Name: Terri Rodriguez Procedure Date: 07/10/2019 2:29 PM MRN: [id number] Endoscopist: Thornton Park MD, MD Age: 73 Referring MD:  Date of Birth: 1945-12-20 Gender: Female Account #: [id number] Procedure:                Colonoscopy Indications:              Surveillance: Personal history of adenomatous                            polyps on last colonoscopy 5 years ago                           Multiple maternal aunts with colon cancer                           Personal history of colon polyps (tubular adenoma                            and sessile serrated adenoma in 2007, tubular                            adenoma 2009)                           Prior colonoscopies: 1995, 2001, 2007, 2009, 2011,                            2015                           Unable to perform a complete colonosopy as                            scheduled yesterday due to poor prep. Medicines:                See the Anesthesia note for documentation of the                            administered medications Procedure:                Pre-Anesthesia Assessment:                           - Prior to the procedure, a History and Physical                            was performed, and patient medications and                            allergies were reviewed. The patient's tolerance of                            previous anesthesia was also reviewed. The risks                            and benefits of the procedure and the sedation  options and risks were discussed with the patient.                            All questions were answered, and informed consent                            was obtained. Prior Anticoagulants: The patient has                            taken no previous anticoagulant or antiplatelet                            agents. ASA Grade Assessment: II - A patient with                            mild systemic disease. After reviewing the risks                            and benefits, the patient was deemed in                            satisfactory condition to undergo the procedure.                           After obtaining informed consent, the colonoscope                            was passed under direct vision. Throughout the                            procedure, the patient's blood pressure, pulse, and                            oxygen saturations were monitored continuously. The                            Colonoscope was introduced through the anus and                            advanced to the the cecum, identified by                            appendiceal orifice and ileocecal valve. A second                            forward view of the right colon was performed. The                            colonoscopy was performed with difficulty due to a                            redundant colon and a tortuous colon. Successful  completion of the procedure was aided by applying                            abdominal pressure. The patient tolerated the                            procedure well. The quality of the bowel                            preparation was good. The ileocecal valve,                            appendiceal orifice, and rectum were photographed. Scope In: 2:43:59 PM Scope Out: 3:05:41 PM Scope Withdrawal Time: 0 hours 7 minutes 56 seconds  Total Procedure Duration: 0 hours 21 minutes 42 seconds  Findings:                 The perianal and digital rectal examinations were                            normal.                           Three sessile polyps were found in the ascending                            colon. The polyps were less than 1 mm in size.                            These polyps were removed with a cold biopsy                            forceps. Resection and retrieval were complete.                            Estimated blood loss was minimal.                           Two  sessile polyps were found in the ascending                            colon. The polyps were 2 to 3 mm in size. These                            polyps were removed with a cold snare. Resection                            and retrieval were complete. Estimated blood loss                            was minimal.                           A 1 mm polyp was found in the rectum. The polyp  was                            sessile. The polyp was removed with a cold snare.                            Resection and retrieval were complete. Estimated                            blood loss was minimal.                           Non-bleeding internal hemorrhoids were found.                           The exam was otherwise without abnormality on                            direct and retroflexion views. Complications:            No immediate complications. Estimated blood loss:                            Minimal. Estimated Blood Loss:     Estimated blood loss was minimal. Impression:               - Three less than 1 mm polyps in the ascending                            colon, removed with a cold biopsy forceps. Resected                            and retrieved.                           - Two 2 to 3 mm polyps in the ascending colon,                            removed with a cold snare. Resected and retrieved.                           - One 1 mm polyp in the rectum, removed with a cold                            snare. Resected and retrieved.                           - Non-bleeding internal hemorrhoids.                           - The examination was otherwise normal on direct                            and retroflexion views. Recommendation:           - Patient has a contact number available for  emergencies. The signs and symptoms of potential                            delayed complications were discussed with the                            patient. Return to normal activities  tomorrow.                            Written discharge instructions were provided to the                            patient.                           - Resume previous diet today.                           - Continue present medications.                           - Await pathology results.                           - Repeat colonoscopy date to be determined after                            pending pathology results are reviewed for                            surveillance. Thornton Park MD, MD 07/10/2019 3:17:43 PM This report has been signed electronically.

## 2019-07-10 NOTE — Progress Notes (Signed)
[place]

## 2019-07-10 NOTE — Progress Notes (Signed)
Pt's states no medical or surgical changes since previsit or office visit. 

## 2019-07-10 NOTE — Progress Notes (Signed)
To PACU, VSS. Report to [email or web address] 

## 2019-07-10 NOTE — Patient Instructions (Signed)
Please read handouts provided. Await pathology results. Continue present medications.      YOU HAD AN ENDOSCOPIC PROCEDURE TODAY AT [facility]:   Refer to the procedure report that was given to you for any specific questions about what was found during the examination.  If the procedure report does not answer your questions, please call your gastroenterologist to clarify.  If you requested that your care partner not be given the details of your procedure findings, then the procedure report has been included in a sealed envelope for you to review at your convenience later.  YOU SHOULD EXPECT: Some feelings of bloating in the abdomen. Passage of more gas than usual.  Walking can help get rid of the air that was put into your GI tract during the procedure and reduce the bloating. If you had a lower endoscopy (such as a colonoscopy or flexible sigmoidoscopy) you may notice spotting of blood in your stool or on the toilet paper. If you underwent a bowel prep for your procedure, you may not have a normal bowel movement for a few days.  Please Note:  You might notice some irritation and congestion in your nose or some drainage.  This is from the oxygen used during your procedure.  There is no need for concern and it should clear up in a day or so.  SYMPTOMS TO REPORT IMMEDIATELY:   Following lower endoscopy (colonoscopy or flexible sigmoidoscopy):  Excessive amounts of blood in the stool  Significant tenderness or worsening of abdominal pains  Swelling of the abdomen that is new, acute  Fever of 100F or higher    For urgent or emergent issues, a gastroenterologist can be reached at any hour by calling [phone].   DIET:  We do recommend a small meal at first, but then you may proceed to your regular diet.  Drink plenty of fluids but you should avoid alcoholic beverages for 24 hours.  ACTIVITY:  You should plan to take it easy for the rest of today and you should NOT  DRIVE or use heavy machinery until tomorrow (because of the sedation medicines used during the test).    FOLLOW UP: Our staff will call the number listed on your records 48-72 hours following your procedure to check on you and address any questions or concerns that you may have regarding the information given to you following your procedure. If we do not reach you, we will leave a message.  We will attempt to reach you two times.  During this call, we will ask if you have developed any symptoms of COVID 19. If you develop any symptoms (ie: fever, flu-like symptoms, shortness of breath, cough etc.) before then, please call [phone].  If you test positive for Covid 19 in the 2 weeks post procedure, please call and report this information to us.    If any biopsies were taken you will be contacted by phone or by letter within the next 1-3 weeks.  Please call us at [phone] if you have not heard about the biopsies in 3 weeks.    SIGNATURES/CONFIDENTIALITY: You and/or your care partner have signed paperwork which will be entered into your electronic medical record.  These signatures attest to the fact that that the information above on your After Visit Summary has been reviewed and is understood.  Full responsibility of the confidentiality of this discharge information lies with you and/or your care-partner. 

## 2019-07-11 ENCOUNTER — Telehealth: Payer: Self-pay

## 2019-07-11 NOTE — Telephone Encounter (Signed)
  Follow up Call-  Call back number 07/10/2019 07/09/2019  Post procedure Call Back phone  # [phone]  Permission to leave phone message Yes Yes  Some recent data might be hidden     Patient questions:  Do you have a fever, pain , or abdominal swelling? No. Pain Score  0 *  Have you tolerated food without any problems? Yes.    Have you been able to return to your normal activities? Yes.    Do you have any questions about your discharge instructions: Diet   No. Medications  No. Follow up visit  No.  Do you have questions or concerns about your Care? No.  Actions: * If pain score is 4 or above: No action needed, pain <4.  1. Have you developed a fever since your procedure? no  2.   Have you had an respiratory symptoms (SOB or cough) since your procedure? no  3.   Have you tested positive for COVID 19 since your procedure no  4.   Have you had any family members/close contacts diagnosed with the COVID 19 since your procedure?  No   If yes to any of these questions please route to Joylene John, RN and Alphonsa Gin, RN.

## 2019-07-11 NOTE — Telephone Encounter (Signed)
Call placed to patient to review benefit for surgical procedure. Left voicemail message requesting a return call/   cc: Lamont Snowball, RN

## 2019-07-11 NOTE — Telephone Encounter (Signed)
Patient returned call. Reviewed benefits for surgical procedure. Patient acknowledges understanding of information presented. Patient requested to obtain benefit information from the facility before proceeding. Provided patient with the [facility] phone number [phone]. Patient advises she will return call to our office next week,  to advise how she would like to proceed.   cc: Lamont Snowball

## 2019-07-12 ENCOUNTER — Telehealth: Payer: Self-pay

## 2019-07-12 ENCOUNTER — Other Ambulatory Visit: Payer: Self-pay | Admitting: Cardiovascular Disease

## 2019-07-12 NOTE — Telephone Encounter (Signed)
  Follow up Call-  Call back number 07/10/2019 07/09/2019  Post procedure Call Back phone  # [phone]  Permission to leave phone message Yes Yes  Some recent data might be hidden     Patient questions:  Do you have a fever, pain , or abdominal swelling? No. Pain Score  0 *  Have you tolerated food without any problems? Yes.    Have you been able to return to your normal activities? Yes.    Do you have any questions about your discharge instructions: Diet   No. Medications  No. Follow up visit  No.  Do you have questions or concerns about your Care? No.  Actions: * If pain score is 4 or above: No action needed, pain <4.   1. Have you developed a fever since your procedure? no  2.   Have you had an respiratory symptoms (SOB or cough) since your procedure? no  3.   Have you tested positive for COVID 19 since your procedure no  4.   Have you had any family members/close contacts diagnosed with the COVID 19 since your procedure?  no   If yes to any of these questions please route to Joylene John, RN and Alphonsa Gin, Therapist, sports.

## 2019-07-17 ENCOUNTER — Encounter: Payer: Self-pay | Admitting: Gastroenterology

## 2019-08-10 ENCOUNTER — Other Ambulatory Visit: Payer: Self-pay | Admitting: Cardiovascular Disease

## 2019-08-14 NOTE — Telephone Encounter (Signed)
Call to patient. Reports has been very busy and hasnt been too concerned about procedure.  States still unsure about proceeding. Advised Dr Sabra Heck recommends procedure to confirm diagnosis. Discussed dates and patient agreeable to 09-09-19. Will schedule and call her back.

## 2019-08-15 ENCOUNTER — Other Ambulatory Visit: Payer: Self-pay | Admitting: Obstetrics & Gynecology

## 2019-08-21 NOTE — Telephone Encounter (Signed)
Message left to return call to [facility] at [phone].   Need to review Surgery Information Form and schedule pre and post op appointments.

## 2019-08-21 NOTE — Telephone Encounter (Signed)
Patient returned call. Surgery information form reviewed in detail with patient and she verbalized understanding. Patient scheduled for pre op appointment on 08-27-2019 at 1300. Patient agreeable to date and time of appointment. Aware will have a separate pre op at [facility]. Pre-surgical Covid testing scheduled for 09-05-2019 at 1400. Patient advised will need to quarantine after testing done until surgery. Patient agreeable. Post op appointment scheduled for 09-20-2019 at 1130, patient aware appointment may need to be moved up depending upon surgery/sutures. Advised will receive a copy of Surgery Information Form at pre op appointment. Patient agreeable.   Routing to provider and will close encounter.   Cc Lamont Snowball, RN

## 2019-08-22 ENCOUNTER — Other Ambulatory Visit: Payer: Self-pay | Admitting: Internal Medicine

## 2019-08-22 NOTE — Telephone Encounter (Signed)
Pt has been notified and will keep appt

## 2019-08-22 NOTE — Telephone Encounter (Signed)
Last ov:10/18/18 Last filled:05/20/2019

## 2019-08-22 NOTE — Telephone Encounter (Signed)
Tell Dr   Lovena Le to keep the appt  For next week    If she can   And I will refill th medication today  thanks

## 2019-08-23 ENCOUNTER — Other Ambulatory Visit: Payer: Self-pay

## 2019-08-27 ENCOUNTER — Other Ambulatory Visit: Payer: Self-pay

## 2019-08-27 ENCOUNTER — Ambulatory Visit (INDEPENDENT_AMBULATORY_CARE_PROVIDER_SITE_OTHER): Payer: Medicare Other | Admitting: Obstetrics & Gynecology

## 2019-08-27 ENCOUNTER — Encounter: Payer: Self-pay | Admitting: Obstetrics & Gynecology

## 2019-08-27 VITALS — BP 104/62 | HR 80 | Temp 97.8°F | Ht 65.5 in | Wt 235.0 lb

## 2019-08-27 DIAGNOSIS — N9089 Other specified noninflammatory disorders of vulva and perineum: Secondary | ICD-10-CM

## 2019-08-27 NOTE — Progress Notes (Signed)
73 y.o. G27P1 Married White or Caucasian female here for discussion of upcoming procedure excision of vulvar mass on 09/09/19 planned due to vulvar lesion that is not completely cystic on ultrasound.  It has not significantly changed in size and has been present about 7-8 months but she brought this to my attention in July.    Procedure, risks, benefits discussed.  There really is no other alternative except considering MRI which may or may not give any additional information.  Risks discussed including infection, bleeding, and need for possible additional procedure depending on results reviewed.  Questions answered.  Ob Hx:   No LMP recorded. Patient has had a hysterectomy.          Sexually active: Yes.   Birth control: Hysterectomy  Last pap: 2011 Normal  Last MMG: 08/31/18 BIRADS2:benign. Has appt scheduled Tobacco: no  Past Surgical History:  Procedure Laterality Date  . ABDOMINAL HYSTERECTOMY    . BACK SURGERY  02/06/2008  . CARDIAC CATHETERIZATION  04/07/2007   Noncritical coronary artery disease. Contiue medical therapy.  Marland Kitchen CARDIAC CATHETERIZATION  12/03/2007   Normal LV function. Mild angiographic mitral valve prolapse. Normal coronary arteries.  Marland Kitchen CARDIOVASCULAR STRESS TEST  11/09/2007   Moderate ischemia in Mid Anterior, Mid Anteroseptal, Apical Anterior, and Apical Septal regions. EKG negative for ischemia.  Marland Kitchen [place]  . dental implants  11/2002,11/2011,11/2012  . DILATION AND CURETTAGE OF UTERUS    . JOINT REPLACEMENT  09/21/2011   bilateral knee  . KNEE ARTHROSCOPY     bilateral  . lumbar surgery fixation with disc replacement  10/08   Left  . NASAL SEPTOPLASTY W/ TURBINOPLASTY    . NOCTURNAL POLYSOMNOGRAM  12/31/2006   Severe obstructive sleep apnea. AHI-87/hr  . REPLACEMENT TOTAL KNEE     bilateral  . TONSILLECTOMY    . TONSILLECTOMY  1952  . TOTAL HIP ARTHROPLASTY Left 10/21/2013   Procedure: LEFT TOTAL HIP ARTHROPLASTY ANTERIOR APPROACH;  Surgeon:  [name], MD;  Location: [facility];  Service: Orthopedics;  Laterality: Left;  . TOTAL HIP ARTHROPLASTY Right 07/16/2014   Procedure: RIGHT TOTAL HIP ARTHROPLASTY ANTERIOR APPROACH;  Surgeon: [name], MD;  Location: [facility];  Service: Orthopedics;  Laterality: Right;  . TRANSTHORACIC ECHOCARDIOGRAM  11/09/2007   EF [id number], LV systolic function normal. Mild aortic root dilation.  . WRIST SURGERY Left 04/03/14   ORIF "distal radial head fracture"    Past Medical History:  Diagnosis Date  . ALLERGIC RHINITIS   . Allergy   . Anginal pain ([place])   . Blood transfusion without reported diagnosis   . Cataract   . Cholelithiasis 06/09/08  . Complication of anesthesia 2012   went to ICU after bilateral knees ,? unstable vital signs  . Diverticulitis   . EPICONDYLITIS, LATERAL    osteoarthritis, previous joint replacements  . Fall 10/2018  . GERD   . Hepatic hemangioma 06/09/08  . Hiatal hernia 03/2000  . Hx of adenomatous polyp of colon 06/05/08  . Hx of cardiac catheterization 2008    clean coronarys DrKelly   . Hx of chest pain    neg cath remot hx of narrowwing lad in 2003 nl 2008, none in many years  . HYPERLIPIDEMIA   . IBS (irritable bowel syndrome)   . LEUKOPENIA, CHRONIC   . Mononucleosis 1963  . Nasal injury 06/14/2012  . RAYNAUD'S SYNDROME, HX OF    improved after cardiac meds initiated  . Rosacea  facial  . Sleep apnea   . SLEEP APNEA, OBSTRUCTIVE    cpap, settings "automatic" settings 11-12  . Subacute thyroiditis   . UNSPECIFIED ANEMIA     Allergies: Codeine and Crab [shellfish allergy]  Current Outpatient Medications  Medication Sig Dispense Refill  . acetaminophen (TYLENOL) 500 MG tablet Take 1,000 mg by mouth every 6 (six) hours as needed for mild pain or headache.     . Alum Hydroxide-Mag Carbonate (GAVISCON PO) Take 1 tablet by mouth 4 (four) times daily as needed (heart burn).     Marland Kitchen amLODipine (NORVASC) 5 MG tablet TAKE 1 TABLET BY MOUTH EVERY DAY 90 tablet  0  . amoxicillin (AMOXIL) 500 MG capsule 2,000 mg. 1 hour prior to dental procedures.    Marland Kitchen aspirin 81 MG tablet Take 81 mg by mouth daily.    Marland Kitchen atorvastatin (LIPITOR) 20 MG tablet TAKE 1 TABLET BY MOUTH EVERY DAY AT 6PM 90 tablet 0  . Azelaic Acid (FINACEA) 15 % cream Apply 1 application topically 2 (two) times daily. After skin is thoroughly washed and patted dry, gently but thoroughly massage a thin film of azelaic acid cream into the affected area twice daily, in the morning and evening.    . carvedilol (COREG) 3.125 MG tablet TAKE 1 TABLET BY MOUTH TWICE A DAY WITH MEALS 60 tablet 4  . celecoxib (CELEBREX) 200 MG capsule TAKE 1 CAPSULE BY MOUTH EVERY DAY 90 capsule 1  . Cholecalciferol (VITAMIN D3) 2000 UNITS TABS Take 1 tablet by mouth daily.    . clindamycin (CLEOCIN T) 1 % external solution 2 (two) times daily as needed.  3  . clobetasol ointment (TEMOVATE) 0.05 % Apply AA of skin BID  0  . diclofenac sodium (VOLTAREN) 1 % GEL Apply 2-4 g topically 4 (four) times daily. As needed for  Arthritis  Pain. 4 Tube 1  . DULoxetine (CYMBALTA) 60 MG capsule TAKE 1 CAPSULE (60 MG TOTAL) BY MOUTH EVERY EVENING. 90 capsule 1  . estradiol (ESTRACE) 0.5 MG tablet 1/2 tab every day.  Please plan to try and decrease to every other day and see how symptoms are with this decrease. 45 tablet 3  . fexofenadine (ALLEGRA) 180 MG tablet Take 180 mg by mouth daily. Takes in PM    . fluocinonide (LIDEX) 0.05 % external solution Apply AA on scalp PRN  0  . metroNIDAZOLE (METROGEL) 0.75 % gel APPLY TO AFFECTED AREA TWICE A DAY    . Multiple Vitamins-Minerals (CENTRUM ADULTS PO) Take 1 tablet by mouth daily.    . nitroGLYCERIN (NITROSTAT) 0.4 MG SL tablet PLACE 1 TABLET UNDER THE TONGUE EVERY 5 MINUTES AS NEEDED FOR CHEST PAIN 25 tablet 3  . omeprazole-sodium bicarbonate (ZEGERID) 40-1100 MG per capsule Take 1 capsule by mouth daily before breakfast.     . pregabalin (LYRICA) 50 MG capsule TAKE 1 CAPSULE BY MOUTH  THREE TIMES A DAY 270 capsule 0  . ramipril (ALTACE) 10 MG capsule TAKE 1 CAPSULE BY MOUTH EVERY DAY 30 capsule 5  . valACYclovir (VALTREX) 1000 MG tablet Take 2 grams po x 2 doses with fever blisters 30 tablet 1   No current facility-administered medications for this visit.     ROS: A comprehensive review of systems was negative.  Exam:    BP 104/62   Pulse 80   Temp 97.8 F (36.6 C) (Temporal)   Ht 5' 5.5" (1.664 m)   Wt 235 lb (106.6 kg)   BMI 38.51 kg/m  General appearance: alert and cooperative Head: Normocephalic, without obvious abnormality, atraumatic Neck: no adenopathy, supple, symmetrical, trachea midline and thyroid not enlarged, symmetric, no tenderness/mass/nodules Lungs: clear to auscultation bilaterally Heart: regular rate and rhythm, S1, S2 normal, no murmur, click, rub or gallop Lymph nodes: No inguinal nodes palpated Neurologic: Grossly normal  Pelvic: External genitalia:  no lesions              Urethra: normal appearing urethra with no masses, tenderness or lesions              Bartholins and Skenes: normal                 Vagina: normal appearing vagina with normal color and discharge, no lesions, two lesion on left of vagina that are mobile              Cervix: absent              Pap taken: No.        Bimanual Exam:  Uterus:  absent                                      Adnexa:    no masses   A: right vulvar lesion, mobile, present about 7-8 months     P:  Excision of vulvar lesion planned Medications/Vitamins reviewed.  Pt knows needs to stop ASA five days prior to surgery.

## 2019-08-28 ENCOUNTER — Telehealth: Payer: Self-pay | Admitting: *Deleted

## 2019-08-28 NOTE — Telephone Encounter (Signed)
Call to patient. Husband Richardson Landry answers and patient is away from phone. Notified Richardson Landry of surgery time change to 1015 and patient should arrive at 56.   Husband listed on DPR and appointment notes.   Encounter closed.

## 2019-09-02 ENCOUNTER — Other Ambulatory Visit: Payer: Self-pay

## 2019-09-02 ENCOUNTER — Encounter: Payer: Self-pay | Admitting: Internal Medicine

## 2019-09-02 ENCOUNTER — Ambulatory Visit (INDEPENDENT_AMBULATORY_CARE_PROVIDER_SITE_OTHER): Payer: Medicare Other | Admitting: Internal Medicine

## 2019-09-02 VITALS — BP 118/66 | HR 88 | Temp 97.8°F | Ht 66.0 in | Wt 233.8 lb

## 2019-09-02 DIAGNOSIS — G8929 Other chronic pain: Secondary | ICD-10-CM | POA: Diagnosis not present

## 2019-09-02 DIAGNOSIS — R945 Abnormal results of liver function studies: Secondary | ICD-10-CM

## 2019-09-02 DIAGNOSIS — M150 Primary generalized (osteo)arthritis: Secondary | ICD-10-CM | POA: Diagnosis not present

## 2019-09-02 DIAGNOSIS — I7300 Raynaud's syndrome without gangrene: Secondary | ICD-10-CM | POA: Diagnosis not present

## 2019-09-02 DIAGNOSIS — Z23 Encounter for immunization: Secondary | ICD-10-CM

## 2019-09-02 DIAGNOSIS — M159 Polyosteoarthritis, unspecified: Secondary | ICD-10-CM

## 2019-09-02 DIAGNOSIS — R7989 Other specified abnormal findings of blood chemistry: Secondary | ICD-10-CM

## 2019-09-02 DIAGNOSIS — Z79899 Other long term (current) drug therapy: Secondary | ICD-10-CM | POA: Diagnosis not present

## 2019-09-02 NOTE — Patient Instructions (Addendum)
Plan  Lab appt     In nov or dec jan ,. Try topicals for the pain . consider acupuncture  If need referral.   ROV about 6 mos

## 2019-09-02 NOTE — Progress Notes (Signed)
Chief Complaint  Patient presents with  . Follow-up  . Medication Management    HPI: Terri Rodriguez 73 y.o. come in for Chronic disease management    GI:  Had  Polyps     On colon  And  Has 5 year recall.  Since then Post prandial  Stools cramps abd pains  Etc no blood   OA progressing  Hands neck back etc frustrating for function . Still plays piano   Mood:  About the same   Raynauds  Quiescent  To get gu  Surgery next weeks   Pain:  Dec lyrica to bid  Drowsiness and weight gain  But  Didn't try to stop al together cause of some help   Thinks close dhead injury last fall made her recall memory not as sharp.   ROS: See pertinent positives and negatives per HPI.  Past Medical History:  Diagnosis Date  . ALLERGIC RHINITIS   . Allergy   . Anginal pain ([place])   . Blood transfusion without reported diagnosis   . Cataract   . Cholelithiasis 06/09/08  . Complication of anesthesia 2012   went to ICU after bilateral knees ,? unstable vital signs  . Diverticulitis   . EPICONDYLITIS, LATERAL    osteoarthritis, previous joint replacements  . Fall 10/2018  . GERD   . Hepatic hemangioma 06/09/08  . Hiatal hernia 03/2000  . Hx of adenomatous polyp of colon 06/05/08  . Hx of cardiac catheterization 2008    clean coronarys DrKelly   . Hx of chest pain    neg cath remot hx of narrowwing lad in 2003 nl 2008, none in many years  . HYPERLIPIDEMIA   . IBS (irritable bowel syndrome)   . LEUKOPENIA, CHRONIC   . Mononucleosis 1963  . Nasal injury 06/14/2012  . RAYNAUD'S SYNDROME, HX OF    improved after cardiac meds initiated  . Rosacea    facial  . Sleep apnea   . SLEEP APNEA, OBSTRUCTIVE    cpap, settings "automatic" settings 11-12  . Subacute thyroiditis   . UNSPECIFIED ANEMIA     Family History  Problem Relation Age of Onset  . Rheum arthritis Mother   . Diabetes Mother   . Heart failure Mother   . Thrombocytopenia Mother   . Sleep apnea Mother   . Ulcers Mother    PUD  . Deep vein thrombosis Father   . Pulmonary embolism Father   . Osteoarthritis Father   . Atrial fibrillation Father   . Dementia Father   . Sleep apnea Father   . Sleep apnea Brother   . Obesity Brother   . Sleep apnea Brother   . Obesity Brother   . Colon cancer Maternal Aunt 28  . Colon cancer Maternal Aunt 90  . Pancreatic cancer Other   . Uterine cancer Other   . Leukemia Other   . Inflammatory bowel disease Other        aunt  . Congenital adrenal hyperplasia Grandchild   . Atrial fibrillation Brother   . Esophageal cancer Neg Hx   . Rectal cancer Neg Hx   . Stomach cancer Neg Hx     Social History   Socioeconomic History  . Marital status: Married    Spouse name: Not on file  . Number of children: 1  . Years of education: Not on file  . Highest education level: Not on file  Occupational History  . Occupation: Physician  Social Needs  .  Financial resource strain: Not on file  . Food insecurity    Worry: Not on file    Inability: Not on file  . Transportation needs    Medical: Not on file    Non-medical: Not on file  Tobacco Use  . Smoking status: Never Smoker  . Smokeless tobacco: Never Used  Substance and Sexual Activity  . Alcohol use: Yes    Alcohol/week: 14.0 standard drinks    Types: 14 Glasses of wine per week  . Drug use: No  . Sexual activity: Yes    Partners: Male    Birth control/protection: Surgical    Comment: TAH/BSO  Lifestyle  . Physical activity    Days per week: Not on file    Minutes per session: Not on file  . Stress: Not on file  Relationships  . Social Herbalist on phone: Not on file    Gets together: Not on file    Attends religious service: Not on file    Active member of club or organization: Not on file    Attends meetings of clubs or organizations: Not on file    Relationship status: Not on file  Other Topics Concern  . 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    Outpatient Medications Prior to Visit  Medication Sig Dispense Refill  . acetaminophen (TYLENOL) 500 MG tablet Take 1,000 mg by mouth every 6 (six) hours as needed for mild pain or headache.     . Alum Hydroxide-Mag Carbonate (GAVISCON PO) Take 1 tablet by mouth 4 (four) times daily as needed (heart burn).     Marland Kitchen amLODipine (NORVASC) 5 MG tablet TAKE 1 TABLET BY MOUTH EVERY DAY 90 tablet 0  . amoxicillin (AMOXIL) 500 MG capsule 2,000 mg. 1 hour prior to dental procedures.    Marland Kitchen aspirin 81 MG tablet Take 81 mg by mouth daily.    Marland Kitchen atorvastatin (LIPITOR) 20 MG tablet TAKE 1 TABLET BY MOUTH EVERY DAY AT 6PM 90 tablet 0  . Azelaic Acid (FINACEA) 15 % cream Apply 1 application topically 2 (two) times daily. After skin is thoroughly washed and patted dry, gently but thoroughly massage a thin film of azelaic acid cream into the affected area twice daily, in the morning and evening.    . carvedilol (COREG) 3.125 MG tablet TAKE 1 TABLET BY MOUTH TWICE A DAY WITH MEALS 60 tablet 4  . celecoxib (CELEBREX) 200 MG capsule TAKE 1 CAPSULE BY MOUTH EVERY DAY 90 capsule 1  . Cholecalciferol (VITAMIN D3) 2000 UNITS TABS Take 1 tablet by mouth daily.    . clindamycin (CLEOCIN T) 1 % external solution 2 (two) times daily as needed.  3  . clobetasol ointment (TEMOVATE) 0.05 % Apply AA of skin BID  0  . diclofenac sodium (VOLTAREN) 1 % GEL Apply 2-4 g topically 4 (four) times daily. As needed for  Arthritis  Pain. 4 Tube 1  . DULoxetine (CYMBALTA) 60 MG capsule TAKE 1 CAPSULE (60 MG TOTAL) BY MOUTH EVERY EVENING. 90 capsule 1  . estradiol (ESTRACE) 0.5 MG tablet 1/2 tab every day.  Please plan to try and decrease to every other day and see how symptoms are with this decrease. 45 tablet 3  . fexofenadine (ALLEGRA) 180 MG tablet Take 180 mg by mouth daily. Takes in  PM    . fluocinonide (LIDEX) 0.05 % external solution Apply AA on scalp PRN  0   . metroNIDAZOLE (METROGEL) 0.75 % gel APPLY TO AFFECTED AREA TWICE A DAY    . Multiple Vitamins-Minerals (CENTRUM ADULTS PO) Take 1 tablet by mouth daily.    . nitroGLYCERIN (NITROSTAT) 0.4 MG SL tablet PLACE 1 TABLET UNDER THE TONGUE EVERY 5 MINUTES AS NEEDED FOR CHEST PAIN 25 tablet 3  . omeprazole-sodium bicarbonate (ZEGERID) 40-1100 MG per capsule Take 1 capsule by mouth daily before breakfast.     . pregabalin (LYRICA) 50 MG capsule TAKE 1 CAPSULE BY MOUTH THREE TIMES A DAY 270 capsule 0  . ramipril (ALTACE) 10 MG capsule TAKE 1 CAPSULE BY MOUTH EVERY DAY 30 capsule 5  . valACYclovir (VALTREX) 1000 MG tablet Take 2 grams po x 2 doses with fever blisters 30 tablet 1   No facility-administered medications prior to visit.      EXAM:  BP 118/66 (BP Location: Right Arm, Patient Position: Sitting, Cuff Size: Large)   Pulse 88   Temp 97.8 F (36.6 C) (Temporal)   Ht 5\' 6"  (1.676 m)   Wt 233 lb 12.8 oz (106.1 kg)   SpO2 97%   BMI 37.74 kg/m   Body mass index is 37.74 kg/m.  GENERAL: vitals reviewed and listed above, alert, oriented, appears well hydrated and in no acute distress HEENT: atraumatic, conjunctiva  clear, no obvious abnormalities on inspection of external nose and ears masks  NECK: no obvious masses on inspection palpation  LUNGS: clear to auscultation bilaterally, no wheezes, rales or rhonchi, good air movement CV: HRRR, no clubbing cyanosis or  peripheral edema nl cap refill  MS: moves all extremities with obv hand deformity  Abdomen:  Sof,t normal bowel sounds without hepatosplenomegaly, no guarding rebound or masses no CVA tenderness PSYCH: pleasant and cooperative, no obvious depression or anxiety Lab Results  Component Value Date   WBC 4.3 06/17/2019   HGB 13.0 06/17/2019   HCT 39.0 06/17/2019   PLT 209 06/17/2019   GLUCOSE 86 06/17/2019   CHOL 155 06/17/2019   TRIG 100 06/17/2019   HDL 78 06/17/2019   LDLCALC 57 06/17/2019   ALT 31 06/17/2019   AST 41  (H) 06/17/2019   NA 142 06/17/2019   K 5.1 06/17/2019   CL 104 06/17/2019   CREATININE 0.66 06/17/2019   BUN 22 06/17/2019   CO2 24 06/17/2019   TSH 1.840 06/17/2019   INR 0.97 07/10/2014   HGBA1C 5.4 02/05/2018   BP Readings from Last 3 Encounters:  09/02/19 118/66  08/27/19 104/62  07/10/19 127/65   Wt Readings from Last 3 Encounters:  09/02/19 233 lb 12.8 oz (106.1 kg)  08/27/19 235 lb (106.6 kg)  07/10/19 230 lb (104.3 kg)    ASSESSMENT AND PLAN:  Discussed the following assessment and plan:  Medication management - Plan: Basic metabolic panel, Hepatic function panel  Need for immunization against influenza - Plan: Flu Vaccine QUAD High Dose(Fluad)  Abnormal LFTs - Plan: Basic metabolic panel, Hepatic function panel  Primary osteoarthritis involving multiple joints - Plan: Basic metabolic panel, Hepatic function panel  Other chronic pain from DJD  Raynaud's disease without gangrene Return in about 6 months (around 03/01/2020) for   labs in 2-3 mos. .  -Patient advised to return or notify health care team  if  new concerns arise.  Patient Instructions  Plan  Lab appt     In nov or dec jan ,.  Try topicals for the pain . consider acupuncture  If need referral.   ROV about 6 mos       Standley Brooking. Marsela Kuan M.D.

## 2019-09-04 ENCOUNTER — Encounter (HOSPITAL_BASED_OUTPATIENT_CLINIC_OR_DEPARTMENT_OTHER): Payer: Self-pay | Admitting: *Deleted

## 2019-09-04 ENCOUNTER — Encounter: Payer: Self-pay | Admitting: Obstetrics & Gynecology

## 2019-09-04 ENCOUNTER — Other Ambulatory Visit: Payer: Self-pay

## 2019-09-04 DIAGNOSIS — E2839 Other primary ovarian failure: Secondary | ICD-10-CM | POA: Diagnosis not present

## 2019-09-04 DIAGNOSIS — Z780 Asymptomatic menopausal state: Secondary | ICD-10-CM | POA: Diagnosis not present

## 2019-09-04 DIAGNOSIS — R29890 Loss of height: Secondary | ICD-10-CM | POA: Diagnosis not present

## 2019-09-04 DIAGNOSIS — Z90710 Acquired absence of both cervix and uterus: Secondary | ICD-10-CM | POA: Diagnosis not present

## 2019-09-04 DIAGNOSIS — Z1231 Encounter for screening mammogram for malignant neoplasm of breast: Secondary | ICD-10-CM | POA: Diagnosis not present

## 2019-09-04 LAB — HM DEXA SCAN
HM Dexa Scan: NORMAL
HM Dexa Scan: NORMAL

## 2019-09-04 NOTE — Progress Notes (Addendum)
Spoke w/ via phone for pre-op interview--- [facility] needs dos---- none             Lab results------ekg, cbc, bmet to be done 09-05-2019, echo 03-07-16 chart/epic, lov dr Claiborne Billings 06-04-18 chart /epic( patient late with cardiology appt due to covid Bring cpap, mask tubing and machine and leave in car COVID test ------09-05-2019 Arrive at -------830 NPO after ------midnight Medications to take morning of surgery -----celebrex, lyrica, carvedilol, zegrid Diabetic medication -----n/a Patient Special Instructions -----na/ Pre-Op special Istructions -----na Patient verbalized understanding of instructions that were given at this phone interview. Patient denies shortness of breath, chest pain, fever, cough a this phone interview.

## 2019-09-05 ENCOUNTER — Other Ambulatory Visit (HOSPITAL_COMMUNITY)
Admission: RE | Admit: 2019-09-05 | Discharge: 2019-09-05 | Disposition: A | Discharge status: Home / Self Care | Payer: Medicare Other | Source: Ambulatory Visit | Attending: Obstetrics & Gynecology | Admitting: Obstetrics & Gynecology

## 2019-09-05 ENCOUNTER — Encounter (HOSPITAL_COMMUNITY)
Admission: RE | Admit: 2019-09-05 | Discharge: 2019-09-05 | Disposition: A | Discharge status: Home / Self Care | Payer: Medicare Other | Source: Ambulatory Visit | Attending: Obstetrics & Gynecology | Admitting: Obstetrics & Gynecology

## 2019-09-05 DIAGNOSIS — Z96643 Presence of artificial hip joint, bilateral: Secondary | ICD-10-CM | POA: Diagnosis not present

## 2019-09-05 DIAGNOSIS — Z79899 Other long term (current) drug therapy: Secondary | ICD-10-CM | POA: Diagnosis not present

## 2019-09-05 DIAGNOSIS — E785 Hyperlipidemia, unspecified: Secondary | ICD-10-CM | POA: Diagnosis not present

## 2019-09-05 DIAGNOSIS — Z20828 Contact with and (suspected) exposure to other viral communicable diseases: Secondary | ICD-10-CM | POA: Diagnosis not present

## 2019-09-05 DIAGNOSIS — Z79890 Hormone replacement therapy (postmenopausal): Secondary | ICD-10-CM | POA: Diagnosis not present

## 2019-09-05 DIAGNOSIS — Z791 Long term (current) use of non-steroidal anti-inflammatories (NSAID): Secondary | ICD-10-CM | POA: Diagnosis not present

## 2019-09-05 DIAGNOSIS — I2510 Atherosclerotic heart disease of native coronary artery without angina pectoris: Secondary | ICD-10-CM | POA: Diagnosis not present

## 2019-09-05 DIAGNOSIS — N898 Other specified noninflammatory disorders of vagina: Secondary | ICD-10-CM | POA: Diagnosis not present

## 2019-09-05 DIAGNOSIS — G4733 Obstructive sleep apnea (adult) (pediatric): Secondary | ICD-10-CM | POA: Diagnosis not present

## 2019-09-05 DIAGNOSIS — K219 Gastro-esophageal reflux disease without esophagitis: Secondary | ICD-10-CM | POA: Diagnosis not present

## 2019-09-05 DIAGNOSIS — Z7982 Long term (current) use of aspirin: Secondary | ICD-10-CM | POA: Diagnosis not present

## 2019-09-05 LAB — CBC
HCT: 37 % (ref 36.0–46.0)
Hemoglobin: 12.2 g/dL (ref 12.0–15.0)
MCH: 32.7 pg (ref 26.0–34.0)
MCHC: 33 g/dL (ref 30.0–36.0)
MCV: 99.2 fL (ref 80.0–100.0)
Platelets: 238 10*3/uL (ref 150–400)
RBC: 3.73 MIL/uL — ABNORMAL LOW (ref 3.87–5.11)
RDW: 13.7 % (ref 11.5–15.5)
WBC: 4.6 10*3/uL (ref 4.0–10.5)
nRBC: 0 % (ref 0.0–0.2)

## 2019-09-05 LAB — BASIC METABOLIC PANEL
Anion gap: 6 (ref 5–15)
BUN: 25 mg/dL — ABNORMAL HIGH (ref 8–23)
CO2: 28 mmol/L (ref 22–32)
Calcium: 9.6 mg/dL (ref 8.9–10.3)
Chloride: 105 mmol/L (ref 98–111)
Creatinine, Ser: 0.68 mg/dL (ref 0.44–1.00)
GFR calc Af Amer: >60 mL/min (ref >60)
GFR calc non Af Amer: >60 mL/min (ref >60)
Glucose, Bld: 99 mg/dL (ref 70–99)
Potassium: 5.3 mmol/L — ABNORMAL HIGH (ref 3.5–5.1)
Sodium: 139 mmol/L (ref 135–145)

## 2019-09-06 LAB — NOVEL CORONAVIRUS, NAA (HOSP ORDER, SEND-OUT TO REF LAB; TAT 18-24 HRS): SARS-CoV-2, NAA: NOT DETECTED

## 2019-09-09 ENCOUNTER — Encounter (HOSPITAL_BASED_OUTPATIENT_CLINIC_OR_DEPARTMENT_OTHER): Payer: Self-pay | Admitting: Certified Registered"

## 2019-09-09 ENCOUNTER — Ambulatory Visit (HOSPITAL_BASED_OUTPATIENT_CLINIC_OR_DEPARTMENT_OTHER): Payer: Medicare Other | Admitting: Physician Assistant

## 2019-09-09 ENCOUNTER — Encounter (HOSPITAL_BASED_OUTPATIENT_CLINIC_OR_DEPARTMENT_OTHER)
Admission: RE | Disposition: A | Discharge status: Home / Self Care | Payer: Self-pay | Source: Home / Self Care | Attending: Obstetrics & Gynecology

## 2019-09-09 ENCOUNTER — Other Ambulatory Visit: Payer: Self-pay

## 2019-09-09 ENCOUNTER — Ambulatory Visit (HOSPITAL_BASED_OUTPATIENT_CLINIC_OR_DEPARTMENT_OTHER)
Admission: RE | Admit: 2019-09-09 | Discharge: 2019-09-09 | Disposition: A | Discharge status: Home / Self Care | Payer: Medicare Other | Attending: Obstetrics & Gynecology | Admitting: Obstetrics & Gynecology

## 2019-09-09 ENCOUNTER — Ambulatory Visit (HOSPITAL_BASED_OUTPATIENT_CLINIC_OR_DEPARTMENT_OTHER): Payer: Medicare Other | Admitting: Certified Registered"

## 2019-09-09 DIAGNOSIS — N898 Other specified noninflammatory disorders of vagina: Secondary | ICD-10-CM | POA: Insufficient documentation

## 2019-09-09 DIAGNOSIS — Z79899 Other long term (current) drug therapy: Secondary | ICD-10-CM | POA: Insufficient documentation

## 2019-09-09 DIAGNOSIS — N9089 Other specified noninflammatory disorders of vulva and perineum: Secondary | ICD-10-CM | POA: Diagnosis not present

## 2019-09-09 DIAGNOSIS — Z79890 Hormone replacement therapy (postmenopausal): Secondary | ICD-10-CM | POA: Diagnosis not present

## 2019-09-09 DIAGNOSIS — E785 Hyperlipidemia, unspecified: Secondary | ICD-10-CM | POA: Insufficient documentation

## 2019-09-09 DIAGNOSIS — Z791 Long term (current) use of non-steroidal anti-inflammatories (NSAID): Secondary | ICD-10-CM | POA: Insufficient documentation

## 2019-09-09 DIAGNOSIS — Z96643 Presence of artificial hip joint, bilateral: Secondary | ICD-10-CM | POA: Diagnosis not present

## 2019-09-09 DIAGNOSIS — I2510 Atherosclerotic heart disease of native coronary artery without angina pectoris: Secondary | ICD-10-CM | POA: Diagnosis not present

## 2019-09-09 DIAGNOSIS — K219 Gastro-esophageal reflux disease without esophagitis: Secondary | ICD-10-CM | POA: Diagnosis not present

## 2019-09-09 DIAGNOSIS — Z7982 Long term (current) use of aspirin: Secondary | ICD-10-CM | POA: Insufficient documentation

## 2019-09-09 DIAGNOSIS — G4733 Obstructive sleep apnea (adult) (pediatric): Secondary | ICD-10-CM | POA: Insufficient documentation

## 2019-09-09 HISTORY — PX: BARTHOLIN GLAND CYST EXCISION: SHX565

## 2019-09-09 HISTORY — DX: Atherosclerotic heart disease of native coronary artery without angina pectoris: I25.10

## 2019-09-09 SURGERY — EXCISION, BARTHOLIN'S GLAND
Anesthesia: Monitor Anesthesia Care | Site: Vagina | Laterality: Right

## 2019-09-09 MED ORDER — FENTANYL CITRATE (PF) 100 MCG/2ML IJ SOLN
25.0000 ug | INTRAMUSCULAR | Status: DC | PRN
  Filled 2019-09-09: qty 1

## 2019-09-09 MED ORDER — HYDROCODONE-ACETAMINOPHEN 5-325 MG PO TABS: 1.0000 | ORAL_TABLET | Freq: Four times a day (QID) | ORAL | 0 refills | Status: DC | PRN

## 2019-09-09 MED ORDER — LIDOCAINE HCL 1 % IJ SOLN
INTRAMUSCULAR | Status: DC | PRN
  Administered 2019-09-09: 3 mL

## 2019-09-09 MED ORDER — IBUPROFEN 800 MG PO TABS: 800.0000 mg | ORAL_TABLET | Freq: Three times a day (TID) | ORAL | 0 refills | Status: DC | PRN

## 2019-09-09 MED ORDER — OXYCODONE HCL 5 MG PO TABS
5.0000 mg | ORAL_TABLET | Freq: Once | ORAL | Status: DC | PRN
  Filled 2019-09-09: qty 1

## 2019-09-09 MED ORDER — OXYCODONE HCL 5 MG/5ML PO SOLN
5.0000 mg | Freq: Once | ORAL | Status: DC | PRN
  Filled 2019-09-09: qty 5

## 2019-09-09 MED ORDER — ONDANSETRON HCL 4 MG/2ML IJ SOLN
4.0000 mg | Freq: Once | INTRAMUSCULAR | Status: DC | PRN
  Filled 2019-09-09: qty 2

## 2019-09-09 MED ORDER — CEFAZOLIN SODIUM-DEXTROSE 2-4 GM/100ML-% IV SOLN
2.0000 g | INTRAVENOUS | Status: AC
  Administered 2019-09-09: 2 g via INTRAVENOUS
  Filled 2019-09-09: qty 100

## 2019-09-09 MED ORDER — LACTATED RINGERS IV SOLN
INTRAVENOUS | Status: DC
  Administered 2019-09-09: 09:00:00 via INTRAVENOUS
  Filled 2019-09-09: qty 1000

## 2019-09-09 MED ORDER — PROPOFOL 10 MG/ML IV BOLUS
INTRAVENOUS | Status: DC | PRN
  Administered 2019-09-09: 50 mg via INTRAVENOUS

## 2019-09-09 MED ORDER — PROPOFOL 500 MG/50ML IV EMUL
INTRAVENOUS | Status: DC | PRN
  Administered 2019-09-09: 100 ug/kg/min via INTRAVENOUS

## 2019-09-09 MED ORDER — FENTANYL CITRATE (PF) 100 MCG/2ML IJ SOLN
INTRAMUSCULAR | Status: AC
  Filled 2019-09-09: qty 2

## 2019-09-09 MED ORDER — LIDOCAINE 2% (20 MG/ML) 5 ML SYRINGE
INTRAMUSCULAR | Status: AC
  Filled 2019-09-09: qty 5

## 2019-09-09 MED ORDER — FENTANYL CITRATE (PF) 100 MCG/2ML IJ SOLN
INTRAMUSCULAR | Status: DC | PRN
  Administered 2019-09-09: 50 ug via INTRAVENOUS

## 2019-09-09 MED ORDER — CEFAZOLIN SODIUM-DEXTROSE 2-4 GM/100ML-% IV SOLN
INTRAVENOUS | Status: AC
  Filled 2019-09-09: qty 100

## 2019-09-09 MED ORDER — LIDOCAINE 2% (20 MG/ML) 5 ML SYRINGE
INTRAMUSCULAR | Status: DC | PRN
  Administered 2019-09-09: 40 mg via INTRAVENOUS

## 2019-09-09 SURGICAL SUPPLY — 40 items
BLADE CLIPPER SENSICLIP SURGIC (BLADE) IMPLANT
BLADE SURG 15 STRL LF DISP TIS (BLADE) ×1 IMPLANT
BLADE SURG 15 STRL SS (BLADE) ×3
CANISTER SUCT 3000ML PPV (MISCELLANEOUS) ×3 IMPLANT
CANISTER SUCTION 1200CC (MISCELLANEOUS) ×3 IMPLANT
COVER WAND RF STERILE (DRAPES) ×3 IMPLANT
ELECT NDL TIP 2.8 STRL (NEEDLE) IMPLANT
ELECT NEEDLE TIP 2.8 STRL (NEEDLE) IMPLANT
ELECT REM PT RETURN 9FT ADLT (ELECTROSURGICAL) ×3
ELECTRODE REM PT RTRN 9FT ADLT (ELECTROSURGICAL) ×1 IMPLANT
GLOVE BIO SURGEON STRL SZ7 (GLOVE) ×2 IMPLANT
GLOVE BIOGEL PI IND STRL 7.0 (GLOVE) ×1 IMPLANT
GLOVE BIOGEL PI IND STRL 7.5 (GLOVE) IMPLANT
GLOVE BIOGEL PI INDICATOR 7.0 (GLOVE) ×4
GLOVE BIOGEL PI INDICATOR 7.5 (GLOVE) ×2
GLOVE ECLIPSE 6.5 STRL STRAW (GLOVE) ×3 IMPLANT
GOWN STRL REUS W/TWL XL LVL3 (GOWN DISPOSABLE) ×3 IMPLANT
KIT TURNOVER CYSTO (KITS) IMPLANT
NDL HYPO 25X1 1.5 SAFETY (NEEDLE) IMPLANT
NEEDLE HYPO 25X1 1.5 SAFETY (NEEDLE) IMPLANT
NS IRRIG 500ML POUR BTL (IV SOLUTION) ×3 IMPLANT
PACK VAGINAL WOMENS (CUSTOM PROCEDURE TRAY) ×3 IMPLANT
PAD OB MATERNITY 4.3X12.25 (PERSONAL CARE ITEMS) ×3 IMPLANT
PAD PREP 24X48 CUFFED NSTRL (MISCELLANEOUS) ×3 IMPLANT
SUT MON AB 5-0 P3 18 (SUTURE) IMPLANT
SUT VIC AB 2-0 SH 27 (SUTURE)
SUT VIC AB 2-0 SH 27XBRD (SUTURE) IMPLANT
SUT VIC AB 3-0 PS2 18 (SUTURE)
SUT VIC AB 3-0 PS2 18XBRD (SUTURE) IMPLANT
SUT VIC AB 3-0 SH 27 (SUTURE) ×3
SUT VIC AB 3-0 SH 27X BRD (SUTURE) IMPLANT
SUT VIC AB 4-0 SH 27 (SUTURE)
SUT VIC AB 4-0 SH 27XANBCTRL (SUTURE) IMPLANT
SUT VIC AB 5-0 PS2 18 (SUTURE) IMPLANT
SUT VICRYL 6 0 UNDY PS 6 (SUTURE) IMPLANT
SWAB COLLECTION DEVICE MRSA (MISCELLANEOUS) IMPLANT
TOWEL OR 17X26 10 PK STRL BLUE (TOWEL DISPOSABLE) ×6 IMPLANT
TUBE ANAEROBIC SPECIMEN COL (MISCELLANEOUS) IMPLANT
VACUUM HOSE/TUBING 7/8INX6FT (MISCELLANEOUS) IMPLANT
WATER STERILE IRR 500ML POUR (IV SOLUTION) ×3 IMPLANT

## 2019-09-09 NOTE — Discharge Instructions (Signed)
°  Post Anesthesia Home Care Instructions  Activity: Get plenty of rest for the remainder of the day. A responsible individual must stay with you for 24 hours following the procedure.  For the next 24 hours, DO NOT: -Drive a car -Paediatric nurse -Drink alcoholic beverages -Take any medication unless instructed by your physician -Make any legal decisions or sign important papers.  Meals: Start with liquid foods such as gelatin or soup. Progress to regular foods as tolerated. Avoid greasy, spicy, heavy foods. If nausea and/or vomiting occur, drink only clear liquids until the nausea and/or vomiting subsides. Call your physician if vomiting continues.  Special Instructions/Symptoms: Your throat may feel dry or sore from the anesthesia or the breathing tube placed in your throat during surgery. If this causes discomfort, gargle with warm salt water. The discomfort should disappear within 24 hours.  Post-surgical Instructions, Outpatient Surgery  You may expect to feel dizzy, weak, and drowsy for as long as 24 hours after receiving the medicine that made you sleep (anesthetic). For the first 24 hours after your surgery:    Do not drive a car, ride a bicycle, participate in physical activities, or take public transportation until you are done taking narcotic pain medicines or as directed by Dr. Sabra Heck.   Do not drink alcohol or take tranquilizers.   Do not take medicine that has not been prescribed by your physicians.   Do not sign important papers or make important decisions while on narcotic pain medicines.   Have a responsible person with you.   PAIN MANAGEMENT  Motrin 800mg .  (This is the same as 4-200mg  over the counter tablets of Motrin or ibuprofen.)  You may take this every eight hours or as needed for cramping.    Vicodin 5/325mg .  For more severe pain, take one or two tablets every four to six hours as needed for pain control.  (Remember that narcotic pain medications increase  your risk of constipation.  If this becomes a problem, you may take an over the counter stool softener like Colace 100mg  up to four times a day.)  DO'S AND DON'T'S  Do not take a tub bath for one week.  You may shower on the first day after your surgery  Do not do any heavy lifting for one to two weeks.  This increases the chance of bleeding.  Do move around as you feel able.  Stairs are fine.  You may begin to exercise again as you feel able.  Do not lift any weights for two weeks.  Do not put anything in the vagina for two weeks--no tampons, intercourse, or douching.    REGULAR MEDIATIONS/VITAMINS:  You may restart all of your regular medications as prescribed.  You may restart all of your vitamins as you normally take them.    PLEASE CALL OR SEEK MEDICAL CARE IF:  You have persistent nausea and vomiting.   You have trouble eating or drinking.   You have an oral temperature above 100.5.   You have constipation that is not helped by adjusting diet or increasing fluid intake. Pain medicines are a common cause of constipation.   You have heavy vaginal bleeding

## 2019-09-09 NOTE — Op Note (Addendum)
09/09/2019  11:20 AM  PATIENT:  Terri Rodriguez  73 y.o. female  PRE-OPERATIVE DIAGNOSIS:  2 right vaginal masses  POST-OPERATIVE DIAGNOSIS:  Same PROCEDURE:  Procedure(s): Excision of right vaginal mass x 2  SURGEON:  Megan Salon  ASSISTANTS: OR staff   ANESTHESIA:   MAC  ESTIMATED BLOOD LOSS: 10cc  BLOOD ADMINISTERED:none   FLUIDS: 700cc LR  UOP: 25 cc drained with I&O cath at beginning of procedure  SPECIMEN:  2 vaginal masses, possible fat necrosis  DISPOSITION OF SPECIMEN:  PATHOLOGY  FINDINGS: 2 firm, smooth nodules just inside and to the right of the vagina, above the location of a Bartholin's gland, proximal to the inferior portion of the right labia majora.  The larger measures 1 cm and the second 0.5cm.  DESCRIPTION OF OPERATION: Patient was taken to the operating room.  She is placed in the supine position. SCDs were on her lower extremities and functioning properly. General anesthesia with an LMA was administered without difficulty.   Legs were then placed in the [place] in the low lithotomy position. The legs were lifted to the high lithotomy position and the Betadine prep was used on the inner thighs perineum and vagina x3. Patient was draped in a normal standard fashion. An in and out catheterization with a red rubber Foley catheter was performed. Approximately 25 cc of clear urine was noted. With retraction on the right labia majora, the two masses were easily identified just inside the vagina.  Using a #15 blade, the skin was excised in a straight line and the subcutaneous tissue was dissected until the lesion was reached.  It was grasped underneath and once the overlying tissue layers were dissected down to the lesion, it then popped out intact.  The smaller, inferior lesion was excised in a similar fashion.  The skin incision wax extended to be able to reach this second lesion.   At this point.  Three deep stiches of 3.0 vicryl were placed to bring the  deep tissue back together.  Then three single interrupted sutures of #3.0 Vicryl were used the close the incision.  Then 1% Lidocaine was instilled beneath the lesion.  Rectal exam was performed at the end of the procedure to ensure that I was not near this for the procedure.  No suture was noted.   At this point, the procedure was ended.  Prep was cleaned off the skin.  The legs are positioned back in the supine position. Sponge, lap, needle, initially counts were correct x2. Patient was taken to recovery in stable condition.  COUNTS:  YES  PLAN OF CARE: Transfer to PACU

## 2019-09-09 NOTE — Anesthesia Postprocedure Evaluation (Signed)
Anesthesia Post Note  Patient: Terri Rodriguez  Procedure(s) Performed: EXCISION OF VULVAR MASS TIMES TWO (Right Vagina )     Patient location during evaluation: PACU Anesthesia Type: MAC Level of consciousness: awake and alert Pain management: pain level controlled Vital Signs Assessment: post-procedure vital signs reviewed and stable Respiratory status: spontaneous breathing, nonlabored ventilation and respiratory function stable Cardiovascular status: blood pressure returned to baseline and stable Postop Assessment: no apparent nausea or vomiting Anesthetic complications: no    Last Vitals:  Vitals:   09/09/19 1145 09/09/19 1211  BP: 126/76 127/81  Pulse: 75   Resp: 19 16  Temp:  36.6 C  SpO2: 96% 97%    Last Pain:  Vitals:   09/09/19 1211  TempSrc:   PainSc: 0-No pain                 Lidia Collum

## 2019-09-09 NOTE — Transfer of Care (Signed)
Immediate Anesthesia Transfer of Care Note  Patient: Terri Rodriguez  Procedure(s) Performed: Procedure(s) (LRB): EXCISION OF VULVAR MASS TIMES TWO (Right)  Patient Location: PACU  Anesthesia Type: MAC  Level of Consciousness: awake, alert , oriented and patient cooperative  Airway & Oxygen Therapy: Patient Spontanous Breathing and Patient connected to face mask oxygen  Post-op Assessment: Report given to PACU RN and Post -op Vital signs reviewed and stable  Post vital signs: Reviewed and stable  Complications: No apparent anesthesia complications  Last Vitals:  Vitals Value Taken Time  BP 119/65 09/09/19 1119  Temp    Pulse 84 09/09/19 1122  Resp 14 09/09/19 1122  SpO2 97 % 09/09/19 1122  Vitals shown include unvalidated device data.  Last Pain:  Vitals:   09/09/19 0911  TempSrc: Oral

## 2019-09-09 NOTE — Anesthesia Preprocedure Evaluation (Signed)
Anesthesia Evaluation  Patient identified by MRN, date of birth, ID band Patient awake    Reviewed: Allergy & Precautions, NPO status , Patient's Chart, lab work & pertinent test results, reviewed documented beta blocker date and time   History of Anesthesia Complications (+) history of anesthetic complications (required overnight ICU admission d/t hypotension after b/l knee replacement; no issues with subsequent surgery)  Airway Mallampati: II  TM Distance: >3 FB Neck ROM: Full    Dental  (+) Teeth Intact   Pulmonary sleep apnea and Continuous Positive Airway Pressure Ventilation ,    Pulmonary exam normal        Cardiovascular Normal cardiovascular exam  H/o coronary vasospasm, had subsequent cath which showed no significant coronary artery disease   Neuro/Psych negative neurological ROS  negative psych ROS   GI/Hepatic Neg liver ROS, hiatal hernia, GERD  Medicated,  Endo/Other  negative endocrine ROS  Renal/GU negative Renal ROS  negative genitourinary   Musculoskeletal negative musculoskeletal ROS (+)   Abdominal   Peds  Hematology negative hematology ROS (+)   Anesthesia Other Findings Raynaud's  Reproductive/Obstetrics                             Anesthesia Physical Anesthesia Plan  ASA: II  Anesthesia Plan: MAC   Post-op Pain Management:    Induction: Intravenous  PONV Risk Score and Plan: 2 and Propofol infusion, TIVA and Treatment may vary due to age or medical condition  Airway Management Planned: Natural Airway, Nasal Cannula and Simple Face Mask  Additional Equipment: None  Intra-op Plan:   Post-operative Plan:   Informed Consent: I have reviewed the patients History and Physical, chart, labs and discussed the procedure including the risks, benefits and alternatives for the proposed anesthesia with the patient or authorized representative who has indicated his/her  understanding and acceptance.       Plan Discussed with:   Anesthesia Plan Comments:         Anesthesia Quick Evaluation

## 2019-09-09 NOTE — H&P (Signed)
Terri Rodriguez is an 73 y.o. female  G39P1 MWF with vulvar/vaginal mass who is here for excision and diagnosis of this finding.  It has been present now for 7-8 months.  She made me aware of this in July.  Ultrasound showed this to either be complex cystic or with a solid component.  Due to this and no decrease in size, excision recommended.  Risks, benefits and alternatives including monitoring have been discussed.  Pt is here and ready to proceed.  Pertinent Gynecological History: Menses: post-menopausal Bleeding: none Contraception: PMP DES exposure: denies Blood transfusions: none Sexually transmitted diseases: no past history Last mammogram: normal Date: 10/19 OB History: G1, P3   Menstrual History: No LMP recorded. Patient has had a hysterectomy.    Past Medical History:  Diagnosis Date  . ALLERGIC RHINITIS   . Allergy   . Anginal pain ([place])   . Blood transfusion without reported diagnosis   . Cataract    small right  . Cholelithiasis 06/09/2008   gallstones 2009 on ct scan  . Complication of anesthesia 2012   went to ICU after bilateral knees , unstable vital signs oct 2012, has surgery since did ok  . Coronary artery disease    30 % narrowing lad  . Diverticulitis   . EPICONDYLITIS, LATERAL    osteoarthritis, previous joint replacements  . Fall 10/2018  . GERD   . Hepatic hemangioma 06/09/08  . Hiatal hernia 03/2000  . Hx of adenomatous polyp of colon 06/05/08  . Hx of cardiac catheterization 2008    clean coronarys DrKelly   . Hx of chest pain 2003   neg cath remot hx of narrowwing lad in 2003 nl 2008, none in many years  . HYPERLIPIDEMIA   . IBS (irritable bowel syndrome)   . LEUKOPENIA, CHRONIC   . Mononucleosis 1963  . RAYNAUD'S SYNDROME, HX OF    improved after cardiac meds initiated  . Rosacea    facial  . Sleep apnea   . SLEEP APNEA, OBSTRUCTIVE    cpap, settings "automatic" settings 11-12  . Subacute thyroiditis   . UNSPECIFIED ANEMIA     Past  Surgical History:  Procedure Laterality Date  . ABDOMINAL HYSTERECTOMY  1993   bso  . BACK SURGERY  2008   l 3 to l4 l4 to l5  . CARDIAC CATHETERIZATION  04/07/2007   Noncritical coronary artery disease. Contiue medical therapy.  Marland Kitchen CARDIAC CATHETERIZATION  12/03/2007   Normal LV function. Mild angiographic mitral valve prolapse. Normal coronary arteries.  Marland Kitchen CARDIOVASCULAR STRESS TEST  11/09/2007   Moderate ischemia in Mid Anterior, Mid Anteroseptal, Apical Anterior, and Apical Septal regions. EKG negative for ischemia.  Marland Kitchen Dante  . dental implants  11/2002,11/2011,11/2012  . dental implants     x 6  . DILATION AND CURETTAGE OF UTERUS     d and e 1988 and 1990  . JOINT REPLACEMENT  09/21/2011   bilateral knee  . KNEE ARTHROSCOPY     bilateral  . lumbar surgery fixation with disc replacement  10/08   Left  . NASAL SEPTOPLASTY W/ TURBINOPLASTY  05/2000  . NOCTURNAL POLYSOMNOGRAM  12/31/2006   Severe obstructive sleep apnea. AHI-87/hr  . REPLACEMENT TOTAL KNEE  2012   bilateral  . TONSILLECTOMY  1952   adenoids too  . TOTAL HIP ARTHROPLASTY Left 10/21/2013   Procedure: LEFT TOTAL HIP ARTHROPLASTY ANTERIOR APPROACH;  Surgeon: [name], MD;  Location: [facility];  Service:  Orthopedics;  Laterality: Left;  . TOTAL HIP ARTHROPLASTY Right 07/16/2014   Procedure: RIGHT TOTAL HIP ARTHROPLASTY ANTERIOR APPROACH;  Surgeon: [name], MD;  Location: [facility];  Service: Orthopedics;  Laterality: Right;  . TRANSTHORACIC ECHOCARDIOGRAM  11/09/2007   EF [id number], LV systolic function normal. Mild aortic root dilation.  . WRIST SURGERY Left 04/03/14   ORIF "distal radial head fracture"    Family History  Problem Relation Age of Onset  . Rheum arthritis Mother   . Diabetes Mother   . Heart failure Mother   . Thrombocytopenia Mother   . Sleep apnea Mother   . Ulcers Mother        PUD  . Deep vein thrombosis Father   . Pulmonary embolism Father   . Osteoarthritis Father    . Atrial fibrillation Father   . Dementia Father   . Sleep apnea Father   . Sleep apnea Brother   . Obesity Brother   . Sleep apnea Brother   . Obesity Brother   . Colon cancer Maternal Aunt 38  . Colon cancer Maternal Aunt 90  . Pancreatic cancer Other   . Uterine cancer Other   . Leukemia Other   . Inflammatory bowel disease Other        aunt  . Congenital adrenal hyperplasia Grandchild   . Atrial fibrillation Brother   . Esophageal cancer Neg Hx   . Rectal cancer Neg Hx   . Stomach cancer Neg Hx     Social History:  reports that she has never smoked. She has never used smokeless tobacco. She reports current alcohol use of about 14.0 standard drinks of alcohol per week. She reports that she does not use drugs.  Allergies:  Allergies  Allergen Reactions  . Codeine     nausea  . Crab [Shellfish Allergy] Itching and Nausea And Vomiting    Medications Prior to Admission  Medication Sig Dispense Refill Last Dose  . acetaminophen (TYLENOL) 500 MG tablet Take 1,000 mg by mouth every 6 (six) hours as needed for mild pain or headache.    09/08/2019 at Unknown time  . Alum Hydroxide-Mag Carbonate (GAVISCON PO) Take 1 tablet by mouth 4 (four) times daily as needed (heart burn).    09/08/2019 at Unknown time  . amLODipine (NORVASC) 5 MG tablet TAKE 1 TABLET BY MOUTH EVERY DAY (Patient taking differently: Takes at 600 pm) 90 tablet 0 09/08/2019 at Unknown time  . atorvastatin (LIPITOR) 20 MG tablet TAKE 1 TABLET BY MOUTH EVERY DAY AT 6PM 90 tablet 0 09/08/2019 at Unknown time  . Azelaic Acid (FINACEA) 15 % cream Apply 1 application topically 2 (two) times daily. After skin is thoroughly washed and patted dry, gently but thoroughly massage a thin film of azelaic acid cream into the affected area t daily,after shower.   Past Week at Unknown time  . carvedilol (COREG) 3.125 MG tablet TAKE 1 TABLET BY MOUTH TWICE A DAY WITH MEALS 60 tablet 4 09/09/2019 at 0715  . celecoxib (CELEBREX) 200 MG  capsule TAKE 1 CAPSULE BY MOUTH EVERY DAY 90 capsule 1 09/09/2019 at 0715  . Cholecalciferol (VITAMIN D3) 2000 UNITS TABS Take 1 tablet by mouth daily.   Past Week at Unknown time  . diclofenac sodium (VOLTAREN) 1 % GEL Apply 2-4 g topically 4 (four) times daily. As needed for  Arthritis  Pain. 4 Tube 1 Past Month at Unknown time  . DULoxetine (CYMBALTA) 60 MG capsule TAKE 1 CAPSULE (60 MG TOTAL)  BY MOUTH EVERY EVENING. 90 capsule 1 09/08/2019 at Unknown time  . estradiol (ESTRACE) 0.5 MG tablet 1/2 tab every day.  Please plan to try and decrease to every other day and see how symptoms are with this decrease. 45 tablet 3 Past Week at Unknown time  . fexofenadine (ALLEGRA) 180 MG tablet Take 180 mg by mouth daily. Takes in PM   09/08/2019 at Unknown time  . metroNIDAZOLE (METROGEL) 0.75 % gel APPLY TO AFFECTED AREA TWICE A DAY   09/08/2019 at Unknown time  . Multiple Vitamins-Minerals (CENTRUM ADULTS PO) Take 1 tablet by mouth daily.   Past Week at Unknown time  . omeprazole-sodium bicarbonate (ZEGERID) 40-1100 MG per capsule Take 1 capsule by mouth daily before breakfast.    09/09/2019 at 0715  . pregabalin (LYRICA) 50 MG capsule TAKE 1 CAPSULE BY MOUTH THREE TIMES A DAY 270 capsule 0 09/09/2019 at 0715  . ramipril (ALTACE) 10 MG capsule TAKE 1 CAPSULE BY MOUTH EVERY DAY (Patient taking differently: Takes at 600 pm) 30 capsule 5 09/08/2019 at Unknown time  . valACYclovir (VALTREX) 1000 MG tablet Take 2 grams po x 2 doses with fever blisters 30 tablet 1 Past Month at Unknown time  . amoxicillin (AMOXIL) 500 MG capsule 2,000 mg. 1 hour prior to dental procedures.   Unknown at Unknown time  . aspirin 81 MG tablet Take 81 mg by mouth daily.   09/05/2019  . clindamycin (CLEOCIN T) 1 % external solution 2 (two) times daily as needed.  3 Unknown at Unknown time  . clobetasol ointment (TEMOVATE) 0.05 % Apply AA of skin BID prn  0 Unknown at Unknown time  . fluocinonide (LIDEX) 0.05 % external solution Apply AA on  scalp PRN  0 Unknown at Unknown time  . nitroGLYCERIN (NITROSTAT) 0.4 MG SL tablet PLACE 1 TABLET UNDER THE TONGUE EVERY 5 MINUTES AS NEEDED FOR CHEST PAIN 25 tablet 3 Unknown at Unknown time    Review of Systems  All other systems reviewed and are negative.   Blood pressure 134/76, pulse 89, temperature 98.2 F (36.8 C), temperature source Oral, resp. rate 16, height 5\' 6"  (1.676 m), weight 105.9 kg, SpO2 99 %. Physical Exam  Constitutional: She is oriented to person, place, and time. She appears well-developed and well-nourished.  Cardiovascular: Normal rate and regular rhythm.  Respiratory: Effort normal and breath sounds normal.  Neurological: She is alert and oriented to person, place, and time.  Psychiatric: She has a normal mood and affect.    No results found for this or any previous visit (from the past 24 hour(s)).  No results found.  Assessment/Plan: 73 yo G3P1 MWF with vulvar/vaginal mass here for excision.  Questions answered and pt ready to proceed.  Megan Salon 09/09/2019, 10:24 AM

## 2019-09-10 ENCOUNTER — Encounter (HOSPITAL_BASED_OUTPATIENT_CLINIC_OR_DEPARTMENT_OTHER): Payer: Self-pay | Admitting: Obstetrics & Gynecology

## 2019-09-10 LAB — SURGICAL PATHOLOGY

## 2019-09-20 ENCOUNTER — Encounter: Payer: Self-pay | Admitting: Obstetrics & Gynecology

## 2019-09-20 ENCOUNTER — Ambulatory Visit (INDEPENDENT_AMBULATORY_CARE_PROVIDER_SITE_OTHER): Payer: Medicare Other | Admitting: Obstetrics & Gynecology

## 2019-09-20 ENCOUNTER — Other Ambulatory Visit: Payer: Self-pay

## 2019-09-20 VITALS — BP 110/60 | HR 68 | Temp 97.3°F | Ht 66.0 in | Wt 236.0 lb

## 2019-09-20 DIAGNOSIS — Z9889 Other specified postprocedural states: Secondary | ICD-10-CM

## 2019-09-20 NOTE — Progress Notes (Signed)
Post Operative Visit  Procedure: Excision of Vulvar Mass times two Days Post-op: 11  Subjective: Doing well.  Denies vaginal bleeding.  Had almost not pain post op.  Never took anything for pain.  Bowel and bladder function is stable.  Pathology reviewed.  Questions answered.  Objective: BP 110/60   Pulse 68   Temp (!) 97.3 F (36.3 C) (Temporal)   Ht 5\' 6"  (1.676 m)   Wt 236 lb (107 kg)   BMI 38.09 kg/m   EXAM General: alert, cooperative and no distress Gyn:  NAEFG, incision healing well without erythema or drainage, sutures removed (3) without difficulty, non tender and no masses noted Vaginal Bleeding: none  Assessment: s/p excision of vaginal masses x 2  Plan: Return for AEX or prn new issues

## 2019-09-23 ENCOUNTER — Encounter: Payer: Self-pay | Admitting: Internal Medicine

## 2019-09-23 ENCOUNTER — Ambulatory Visit (INDEPENDENT_AMBULATORY_CARE_PROVIDER_SITE_OTHER): Payer: Medicare Other | Admitting: Internal Medicine

## 2019-09-23 ENCOUNTER — Other Ambulatory Visit: Payer: Self-pay

## 2019-09-23 ENCOUNTER — Telehealth: Payer: Self-pay | Admitting: *Deleted

## 2019-09-23 VITALS — BP 125/60 | HR 85 | Temp 97.4°F | Ht 66.0 in | Wt 238.6 lb

## 2019-09-23 DIAGNOSIS — E871 Hypo-osmolality and hyponatremia: Secondary | ICD-10-CM | POA: Diagnosis not present

## 2019-09-23 DIAGNOSIS — G4733 Obstructive sleep apnea (adult) (pediatric): Secondary | ICD-10-CM | POA: Diagnosis not present

## 2019-09-23 LAB — BASIC METABOLIC PANEL
BUN: 25 mg/dL — ABNORMAL HIGH (ref 6–23)
CO2: 28 mEq/L (ref 19–32)
Calcium: 9.1 mg/dL (ref 8.4–10.5)
Chloride: 105 mEq/L (ref 96–112)
Creatinine, Ser: 0.72 mg/dL (ref 0.40–1.20)
GFR: 79.04 mL/min (ref >60.00)
Glucose, Bld: 87 mg/dL (ref 70–99)
Potassium: 4.5 mEq/L (ref 3.5–5.1)
Sodium: 139 mEq/L (ref 135–145)

## 2019-09-23 NOTE — Telephone Encounter (Signed)
Patient notified of BMD results. Report to scan.  

## 2019-09-23 NOTE — Assessment & Plan Note (Signed)
Continues to benefit and very comfortable with her CPAP. No concerns identified. Plan- continue auto 4-18

## 2019-09-23 NOTE — Assessment & Plan Note (Signed)
She is aware of benefits normalized weight would bring.

## 2019-09-23 NOTE — Progress Notes (Signed)
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 No supplies needed at this time.  Body weight today 249 pounds Download compliance 100%, AHI 2.8/hour.  She says "could not live without my CPAP" and describes significant improvement in headaches since she has been using CPAP. No active breathing problems.  Significant problems with osteoarthritis.  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 After recent minor surgical procedure done while on her back, nurse said "you really do need CPAP". Otherwise breathing well- denies heart/ lung problems. Had flu vax Her PCP office asked [place] to get a BMET at this visit for issue of hyponatremia to save her a trip back to that office.    ROS-see HPI + = positive Constitutional:   No-   weight loss, night sweats, fevers, chills, fatigue, lassitude. HEENT:   No-  headaches, difficulty swallowing, tooth/dental problems, sore throat,       No-  sneezing, itching, ear ache, nasal congestion, post nasal drip,  CV:  No-   chest pain, orthopnea, PND, swelling in lower extremities, anasarca,                                                 dizziness, palpitations Resp: No-   shortness of breath with exertion or at rest.              No-   productive cough,  No non-productive  cough,  No- coughing up of blood.              No-   change in color of mucus.  No- wheezing.   Skin: No-   rash or lesions. GI:  No-   heartburn, indigestion, abdominal pain, nausea, vomiting,  GU:  MS:  +joint pain or swelling.  Neuro-     nothing unusual Psych:  No- change in mood or affect. No depression or anxiety.  No memory loss.  OBJ- Physical Exam General- Alert, Oriented, Affect-appropriate, Distress- none acute, +obese Skin- rash-none, lesions- none, excoriation- none Lymphadenopathy- none Head- atraumatic            Eyes- Gross vision intact, PERRLA, conjunctivae and secretions clear            Ears- Hearing, canals-normal            Nose- Clear, no-Septal dev, mucus, polyps, erosion, perforation             Throat- Mallampati III-IV , mucosa clear , drainage- none, tonsils- atrophic Neck- flexible , trachea midline, no stridor , thyroid nl, carotid no bruit Chest - symmetrical excursion , unlabored           Heart/CV- RRR , no murmur , no gallop  , no rub, nl s1 s2                           -  JVD- none , edema- none, stasis changes- none, varices- none           Lung- clear to P&A, wheeze- none, cough- none , dullness-none, rub- none           Chest wall-  Abd-  Br/ Gen/ Rectal- Not done, not indicated Extrem- cyanosis- none, clubbing, none, atrophy- none, strength- nl Neuro- grossly intact to observation

## 2019-09-23 NOTE — Patient Instructions (Addendum)
Order- lab   BMET     Dx hyponatremia  We can continue  CPAP auto 4-18,mask of choice, humidifier, supplies, AirView/ card  Please call if we can help

## 2019-09-24 NOTE — Progress Notes (Signed)
Pt aware. Nothing further needed 

## 2019-09-25 ENCOUNTER — Encounter: Payer: Self-pay | Admitting: Internal Medicine

## 2019-10-01 ENCOUNTER — Encounter: Payer: Self-pay | Admitting: Cardiovascular Disease

## 2019-10-01 ENCOUNTER — Ambulatory Visit (INDEPENDENT_AMBULATORY_CARE_PROVIDER_SITE_OTHER): Payer: Medicare Other | Admitting: Cardiovascular Disease

## 2019-10-01 ENCOUNTER — Other Ambulatory Visit: Payer: Self-pay

## 2019-10-01 VITALS — BP 125/72 | HR 90 | Temp 97.2°F | Ht 66.0 in | Wt 238.8 lb

## 2019-10-01 DIAGNOSIS — I2510 Atherosclerotic heart disease of native coronary artery without angina pectoris: Secondary | ICD-10-CM | POA: Diagnosis not present

## 2019-10-01 DIAGNOSIS — I7300 Raynaud's syndrome without gangrene: Secondary | ICD-10-CM

## 2019-10-01 DIAGNOSIS — I10 Essential (primary) hypertension: Secondary | ICD-10-CM | POA: Diagnosis not present

## 2019-10-01 DIAGNOSIS — G4733 Obstructive sleep apnea (adult) (pediatric): Secondary | ICD-10-CM

## 2019-10-01 DIAGNOSIS — E785 Hyperlipidemia, unspecified: Secondary | ICD-10-CM | POA: Diagnosis not present

## 2019-10-01 NOTE — Progress Notes (Signed)
Patient ID: Terri Rodriguez, female   DOB: [birth year], 73 y.o.   MRN: [id number]    Primary M.D.: Dr. Shanon Ace  HPI: Terri Rodriguez is a 73 y.o. female who presents to the office today for 15 month cardiology evaluation.  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 Cardiac catheterization demonstrated a 30% tubular narrowing of the proximal LAD. She has a history of probable Raynaud's with a remote history of digital ischemia/spasm. She has been aggressively treated with medical therapy both for potential coronary vasospasm as well as lipid-lowering therapy for treatment methods to optimize endothelial function and blood pressure. She does have significant arthritic issues with degenerative disc disease and has undergone surgery involving L3-L4, L4-L5 by Dr. Sherwood Gambler. She also is status post bilateral knee surgery as well as left hip replacement. She has a history of obstructive sleep apnea on CPAP therapy, history of osteoarthritis which has been progressive. There also is a history of rosacea.    She sustained a left radius fracture and required open reduction and internal fixation in April 2015.  In August 2015, she underwent right hip replacement.  Her arthritis has limited her activity.  She has a history of obstructive sleep apnea and admits to 100% CPAP use.  She is unaware of breakthrough snoring.  She denies excessive daytime sleepiness.  When I saw her in April 2019 she denied any recurrent episodes of chest tightness or pressure. She denied any Raynaud's phenomenon. She was unaware of any palpitations.  She continues to have neuropathy in nerve pain issues for which she is on Cymbalta and recently was started on pregabalin with improvement.  There is no shortness of breath, chest pain, or digital vasospasm on amlodipine 5 mg, ramipril 10 mg, and carvedilol 3.125 mg twice a day.  She is on atorvastatin 20 mg. Lipid studies in  2018  showed a total cholesterol 165, HDL 76, LDLs 73, which had risen from 59 one year previously, and triglycerides are 108.    I last saw her in July 2019 at which time she was remaining cardiac stable.  She is a grandmother of 2 girls who have congenital adrenal hypoplasia are doing well.  She continued to have arthritic issues with both hips and knees.  He was not successful with weight loss she has not been successful with weight loss.  She uses CPAP with 100% compliance with her DME company being [employer].   Since I last saw her, she denies chest pain or shortness of breath.  She denies any Raynaud's symptoms.  She has been stable on a medical regimen to reduce potential for vasospasm as well as improve endothelial function including amlodipine 5 mg, ramipril 10 mg and carvedilol 3.125 mg.  Her blood pressure has been stable.  She is continue to be on atorvastatin 20 mg for hyperlipidemia.  She continues to have significant pain from her chronic arthritic issues and is on Cymbalta for nerve pain.  Recently she has been bothered by arthritis in her neck.  She has seen Dr. Sherwood Gambler.  He does have issues with both knees, both hips, and DJD of her back.  He admits to  100% compliance with CPAP.  She presents for evaluation.  Past Medical History:  Diagnosis Date  . ALLERGIC RHINITIS   . Allergy   . Anginal pain ([place])   . Blood transfusion without reported diagnosis   . Cataract    small  right  . Cholelithiasis 06/09/2008   gallstones 2009 on ct scan  . Complication of anesthesia 2012   went to ICU after bilateral knees , unstable vital signs oct 2012, has surgery since did ok  . Coronary artery disease    30 % narrowing lad  . Diverticulitis   . EPICONDYLITIS, LATERAL    osteoarthritis, previous joint replacements  . Fall 10/2018  . GERD   . Hepatic hemangioma 06/09/08  . Hiatal hernia 03/2000  . Hx of adenomatous polyp of colon 06/05/08  . Hx of cardiac catheterization 2008    clean  coronarys DrKelly   . Hx of chest pain 2003   neg cath remot hx of narrowwing lad in 2003 nl 2008, none in many years  . HYPERLIPIDEMIA   . IBS (irritable bowel syndrome)   . LEUKOPENIA, CHRONIC   . Mononucleosis 1963  . RAYNAUD'S SYNDROME, HX OF    improved after cardiac meds initiated  . Rosacea    facial  . Sleep apnea   . SLEEP APNEA, OBSTRUCTIVE    cpap, settings "automatic" settings 11-12  . Subacute thyroiditis   . UNSPECIFIED ANEMIA     Past Surgical History:  Procedure Laterality Date  . ABDOMINAL HYSTERECTOMY  1993   bso  . BACK SURGERY  2008   l 3 to l4 l4 to l5  . BARTHOLIN GLAND CYST EXCISION Right 09/09/2019   Procedure: EXCISION OF VULVAR MASS TIMES TWO;  Surgeon: Megan Salon, MD;  Location: [facility];  Service: Gynecology;  Laterality: Right;  . CARDIAC CATHETERIZATION  04/07/2007   Noncritical coronary artery disease. Contiue medical therapy.  Marland Kitchen CARDIAC CATHETERIZATION  12/03/2007   Normal LV function. Mild angiographic mitral valve prolapse. Normal coronary arteries.  Marland Kitchen CARDIOVASCULAR STRESS TEST  11/09/2007   Moderate ischemia in Mid Anterior, Mid Anteroseptal, Apical Anterior, and Apical Septal regions. EKG negative for ischemia.  Marland Kitchen [place]  . dental implants  11/2002,11/2011,11/2012  . dental implants     x 6  . DILATION AND CURETTAGE OF UTERUS     d and e 1988 and 1990  . JOINT REPLACEMENT  09/21/2011   bilateral knee  . KNEE ARTHROSCOPY     bilateral  . lumbar surgery fixation with disc replacement  10/08   Left  . NASAL SEPTOPLASTY W/ TURBINOPLASTY  05/2000  . NOCTURNAL POLYSOMNOGRAM  12/31/2006   Severe obstructive sleep apnea. AHI-87/hr  . REPLACEMENT TOTAL KNEE  2012   bilateral  . TONSILLECTOMY  1952   adenoids too  . TOTAL HIP ARTHROPLASTY Left 10/21/2013   Procedure: LEFT TOTAL HIP ARTHROPLASTY ANTERIOR APPROACH;  Surgeon: [name], MD;  Location: [facility];  Service: Orthopedics;  Laterality:  Left;  . TOTAL HIP ARTHROPLASTY Right 07/16/2014   Procedure: RIGHT TOTAL HIP ARTHROPLASTY ANTERIOR APPROACH;  Surgeon: [name], MD;  Location: [facility];  Service: Orthopedics;  Laterality: Right;  . TRANSTHORACIC ECHOCARDIOGRAM  11/09/2007   EF 28%, LV systolic function normal. Mild aortic root dilation.  . WRIST SURGERY Left 04/03/14   ORIF "distal radial head fracture"    Allergies  Allergen Reactions  . Codeine     nausea  . Crab [Shellfish Allergy] Itching and Nausea And Vomiting    Current Outpatient Medications  Medication Sig Dispense Refill  . acetaminophen (TYLENOL) 500 MG tablet Take 1,000 mg by mouth every 6 (six) hours as needed for mild pain or headache.     Marland Kitchen  Alum Hydroxide-Mag Carbonate (GAVISCON PO) Take 1 tablet by mouth 4 (four) times daily as needed (heart burn).     Marland Kitchen amLODipine (NORVASC) 5 MG tablet TAKE 1 TABLET BY MOUTH EVERY DAY (Patient taking differently: Takes at 600 pm) 90 tablet 0  . amoxicillin (AMOXIL) 500 MG capsule 2,000 mg. 1 hour prior to dental procedures.    Marland Kitchen aspirin 81 MG tablet Take 81 mg by mouth daily.    Marland Kitchen atorvastatin (LIPITOR) 20 MG tablet TAKE 1 TABLET BY MOUTH EVERY DAY AT 6PM 90 tablet 0  . Azelaic Acid (FINACEA) 15 % cream Apply 1 application topically 2 (two) times daily. After skin is thoroughly washed and patted dry, gently but thoroughly massage a thin film of azelaic acid cream into the affected area t daily,after shower.    . carvedilol (COREG) 3.125 MG tablet TAKE 1 TABLET BY MOUTH TWICE A DAY WITH MEALS 60 tablet 4  . celecoxib (CELEBREX) 200 MG capsule TAKE 1 CAPSULE BY MOUTH EVERY DAY 90 capsule 1  . Cholecalciferol (VITAMIN D3) 2000 UNITS TABS Take 1 tablet by mouth daily.    . clindamycin (CLEOCIN T) 1 % external solution 2 (two) times daily as needed.  3  . clobetasol ointment (TEMOVATE) 0.05 % Apply AA of skin BID prn  0  . diclofenac sodium (VOLTAREN) 1 % GEL Apply 2-4 g topically 4 (four) times daily. As needed for   Arthritis  Pain. 4 Tube 1  . DULoxetine (CYMBALTA) 60 MG capsule TAKE 1 CAPSULE (60 MG TOTAL) BY MOUTH EVERY EVENING. 90 capsule 1  . estradiol (ESTRACE) 0.5 MG tablet 1/2 tab every day.  Please plan to try and decrease to every other day and see how symptoms are with this decrease. 45 tablet 3  . fexofenadine (ALLEGRA) 180 MG tablet Take 180 mg by mouth daily. Takes in PM    . fluocinonide (LIDEX) 0.05 % external solution Apply AA on scalp PRN  0  . metroNIDAZOLE (METROGEL) 0.75 % gel APPLY TO AFFECTED AREA TWICE A DAY    . Multiple Vitamins-Minerals (CENTRUM ADULTS PO) Take 1 tablet by mouth daily.    . nitroGLYCERIN (NITROSTAT) 0.4 MG SL tablet PLACE 1 TABLET UNDER THE TONGUE EVERY 5 MINUTES AS NEEDED FOR CHEST PAIN 25 tablet 3  . omeprazole-sodium bicarbonate (ZEGERID) 40-1100 MG per capsule Take 1 capsule by mouth daily before breakfast.     . pregabalin (LYRICA) 50 MG capsule TAKE 1 CAPSULE BY MOUTH THREE TIMES A DAY (Patient taking differently: Take 50 mg by mouth 2 (two) times daily. ) 270 capsule 0  . ramipril (ALTACE) 10 MG capsule TAKE 1 CAPSULE BY MOUTH EVERY DAY (Patient taking differently: Takes at 600 pm) 30 capsule 5  . valACYclovir (VALTREX) 1000 MG tablet Take 2 grams po x 2 doses with fever blisters 30 tablet 1   No current facility-administered medications for this visit.     Social History   Socioeconomic History  . Marital status: Married    Spouse name: Not on file  . Number of children: 1  . Years of education: Not on file  . Highest education level: Not on file  Occupational History  . Occupation: Physician  Social Needs  . Financial resource strain: Not on file  . Food insecurity    Worry: Not on file    Inability: Not on file  . Transportation needs    Medical: Not on file    Non-medical: Not on file  Tobacco Use  .  Smoking status: Never Smoker  . Smokeless tobacco: Never Used  Substance and Sexual Activity  . Alcohol use: Yes    Alcohol/week: 14.0  standard drinks    Types: 14 Glasses of wine per week    Comment: wine 2 glasses per day  . Drug use: No  . Sexual activity: Yes    Partners: Male    Birth control/protection: Surgical    Comment: TAH/BSO  Lifestyle  . Physical activity    Days per week: Not on file    Minutes per session: Not on file  . Stress: Not on file  Relationships  . Social Herbalist on phone: Not on file    Gets together: Not on file    Attends religious service: Not on file    Active member of club or organization: Not on file    Attends meetings of clubs or organizations: Not on file    Relationship status: Not on file  . Intimate partner violence    Fear of current or ex partner: Not on file    Emotionally abused: Not on file    Physically abused: Not on file    Forced sexual activity: Not on file  Other Topics Concern  . 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    Family History  Problem Relation Age of Onset  . Rheum arthritis Mother   . Diabetes Mother   . Heart failure Mother   . Thrombocytopenia Mother   . Sleep apnea Mother   . Ulcers Mother        PUD  . Deep vein thrombosis Father   . Pulmonary embolism Father   . Osteoarthritis Father   . Atrial fibrillation Father   . Dementia Father   . Sleep apnea Father   . Sleep apnea Brother   . Obesity Brother   . Sleep apnea Brother   . Obesity Brother   . Colon cancer Maternal Aunt 25  . Colon cancer Maternal Aunt 90  . Pancreatic cancer Other   . Uterine cancer Other   . Leukemia Other   . Inflammatory bowel disease Other        aunt  . Congenital adrenal hyperplasia Grandchild   . Atrial fibrillation Brother   . Esophageal cancer Neg Hx   . Rectal cancer Neg Hx   . Stomach cancer Neg Hx     ROS General: Negative; No fevers, chills, or night sweats; positive for weight gain  HEENT: Negative; No  changes in vision or hearing, sinus congestion, difficulty swallowing Pulmonary: Negative; No cough, wheezing, shortness of breath, hemoptysis Cardiovascular: Negative; No chest pain, presyncope, syncope, palpitations GI: Positive for GERD No nausea, vomiting, diarrhea, or abdominal pain GU: Negative; No dysuria, hematuria, or difficulty voiding Musculoskeletal: Positive for lumbar disc surgery, bilateral knee replacements and bilateral hip replacements; now with neck discomfort from cervical disc disease Hematologic/Oncology: Negative; no easy bruising, bleeding Endocrine: Negative; no heat/cold intolerance; no diabetes Neuro: Negative; no changes in balance, headaches Skin: Negative; No rashes or skin lesions Psychiatric: Negative; No behavioral problems, depression Sleep: Positive for obstructive sleep apnea on CPAP therapy; No snoring, daytime sleepiness, hypersomnolence, bruxism, restless legs, hypnogognic hallucinations, no cataplexy Other comprehensive 14 point system review is negative.   PE BP 125/72   Pulse 90   Temp (!) 97.2 F (  36.2 C)   Ht 5' 6"  (1.676 m)   Wt 238 lb 12.8 oz (108.3 kg)   SpO2 96%   BMI 38.54 kg/m    Repeat blood pressure by me 122/70  Wt Readings from Last 3 Encounters:  10/01/19 238 lb 12.8 oz (108.3 kg)  09/23/19 238 lb 9.6 oz (108.2 kg)  09/20/19 236 lb (107 kg)   11 pound weight loss since her last evaluation with me  General: Alert, oriented, no distress.  Skin: normal turgor, no rashes, warm and dry HEENT: Normocephalic, atraumatic. Pupils equal round and reactive to light; sclera anicteric; extraocular muscles intact; Nose without nasal septal hypertrophy Mouth/Parynx benign; Mallinpatti scale 3 Neck: No JVD, no carotid bruits; normal carotid upstroke Lungs: clear to ausculatation and percussion; no wheezing or rales Chest wall: without tenderness to palpitation Heart: PMI not displaced, RRR, s1 s2 normal, 1/6 systolic murmur, no  diastolic murmur, no rubs, gallops, thrills, or heaves Abdomen: soft, nontender; no hepatosplenomehaly, BS+; abdominal aorta nontender and not dilated by palpation. Back: no CVA tenderness Pulses 2+ Musculoskeletal: full range of motion, normal strength, no joint deformities Extremities: no clubbing cyanosis or edema, Homan's sign negative  Neurologic: grossly nonfocal; Cranial nerves grossly wnl Psychologic: Normal mood and affect   ECG (independently read by me): Normal sinus rhythm at 79 bpm.  Incomplete right bundle branch block.  No ectopy.  July 2019 ECG (independently read by me): Normal sinus rhythm at 70 bpm.  T wave abnormality, nonspecific V1 V2.  Normal intervals.  No ectopy.  April 2018 ECG (independently read by me): Normal sinus rhythm at 65 bpm.  Nonspecific T waves.  Normal intervals.  March 2017 ECG (independently read by me): Normal sinus rhythm at 81 bpm isolate a PVC.  Nonspecific T-wave changes with previously noted T-wave inversion in V1, V2.  QTc interval 487 ms.  February 2016 ECG (independently read by me): Normal sinus rhythm at 81 bpm.  Previously noted T-wave inversion V1 and V2 which is old and has been documented for over 10 years.  Mild QTC increased at 469 ms  February 2015 ECG (independently read by me): Sinus rhythm at 76 beats per minute period. She noted T wave inversion in V1 V2 which is old.  LABS: BMP Latest Ref Rng & Units 09/23/2019 09/05/2019 06/17/2019  Glucose 70 - 99 mg/dL 87 99 86  BUN 6 - 23 mg/dL 25(H) 25(H) 22  Creatinine 0.40 - 1.20 mg/dL 0.72 0.68 0.66  BUN/Creat Ratio 12 - 28 - - 33(H)  Sodium 135 - 145 mEq/L 139 139 142  Potassium 3.5 - 5.1 mEq/L 4.5 5.3(H) 5.1  Chloride 96 - 112 mEq/L 105 105 104  CO2 19 - 32 mEq/L 28 28 24   Calcium 8.4 - 10.5 mg/dL 9.1 9.6 9.5   Hepatic Function Latest Ref Rng & Units 06/17/2019 02/05/2018 04/26/2017  Total Protein 6.0 - 8.5 g/dL 6.4 6.6 6.4  Albumin 3.7 - 4.7 g/dL 4.5 4.1 4.4  AST 0 - 40 IU/L  41(H) 37 42(H)  ALT 0 - 32 IU/L 31 25 33  Alk Phosphatase 39 - 117 IU/L 85 79 75  Total Bilirubin 0.0 - 1.2 mg/dL 0.3 0.5 0.5  Bilirubin, Direct 0.0 - 0.3 mg/dL - 0.1 0.1    CBC Latest Ref Rng & Units 09/05/2019 06/17/2019 02/05/2018  WBC 4.0 - 10.5 K/uL 4.6 4.3 3.4(L)  Hemoglobin 12.0 - 15.0 g/dL 12.2 13.0 13.0  Hematocrit 36.0 - 46.0 % 37.0 39.0 37.7  Platelets 150 -  400 K/uL 238 209 228.0    Lab Results  Component Value Date   MCV 99.2 09/05/2019   MCV 102 (H) 06/17/2019   MCV 97.3 02/05/2018    Lab Results  Component Value Date   TSH 1.840 06/17/2019   Lab Results  Component Value Date   HGBA1C 5.4 02/05/2018   Lipid Panel     Component Value Date/Time   CHOL 155 06/17/2019 1109   TRIG 100 06/17/2019 1109   HDL 78 06/17/2019 1109   CHOLHDL 2.0 06/17/2019 1109   CHOLHDL 2 02/05/2018 1013   VLDL 15.0 02/05/2018 1013   LDLCALC 57 06/17/2019 1109     RADIOLOGY: No results found.  IMPRESSION:  1. Essential hypertension   2. Mild CAD   3. Hyperlipidemia LDL goal <70   4. Raynaud's phenomenon without gangrene   5. 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 She underwent repeat cardiac catheterization in 2008 prior to back surgery after nuclear perfusion study raise the possibility of ischemia.  Coronary angiography showed normalization of coronary arteries without significant obstruction.  She has continued to well without recurrent anginal symptomatology.  She has a history of digital ischemia digestive of Raynaud's symptomatology but this has completely stabilized with her medical regimen now consisting of amlodipine 5 mg, ramipril 10 mg, carvedilol 3.125 mg.  She initially was started on this regimen and attempt to reduce vasospasm as well as improve endothelial function.  Her blood pressure today  remained stable on her current regimen.  She continues to be on atorvastatin 20 mg with target LDL less than 70.  Laboratory had shown an LDL of 57.  He continues to have issues with her significant osteoarthritis and joint issues involving her knees, hips, and back.  She is on Cymbalta as well as Lyrica for nerve pain.  She continues to use CPAP with 100% compliance.  As long as she is remaining stable I will see her in 1 year for reevaluation or sooner problems arise.   Troy Sine, MD, [facility]  10/02/2019 5:43 PM

## 2019-10-01 NOTE — Patient Instructions (Signed)
Follow-Up: IN 12 months Please call our office 2 months in advance, aug 2021 to schedule this oct 2021 appointment. In Person You may see Philomena Doheny or one of the following Advanced Practice Providers on your designated Care Team:  Almyra Deforest, PA-C Fabian Sharp, PA-C or  [place], [place].    Medication Instructions:  The current medical regimen is effective;  continue present plan and medications as directed. Please refer to the Current Medication list given to you today. If you need a refill on your cardiac medications before your next appointment, please call your pharmacy.  At [facility], you and your health needs are our priority.  As part of our continuing mission to provide you with exceptional heart care, we have created designated Provider Care Teams.  These Care Teams include your primary Cardiologist (physician) and Advanced Practice Providers (APPs -  Physician Assistants and Nurse Practitioners) who all work together to provide you with the care you need, when you need it.  Thank you for choosing [facility] at [facility]!!

## 2019-10-02 ENCOUNTER — Encounter: Payer: Self-pay | Admitting: Internal Medicine

## 2019-10-02 ENCOUNTER — Encounter: Payer: Self-pay | Admitting: Cardiovascular Disease

## 2019-10-03 ENCOUNTER — Other Ambulatory Visit: Payer: Self-pay | Admitting: Cardiovascular Disease

## 2019-10-25 ENCOUNTER — Encounter: Payer: Self-pay | Admitting: Internal Medicine

## 2019-10-25 ENCOUNTER — Telehealth (INDEPENDENT_AMBULATORY_CARE_PROVIDER_SITE_OTHER): Payer: Medicare Other | Admitting: Internal Medicine

## 2019-10-25 ENCOUNTER — Other Ambulatory Visit: Payer: Self-pay

## 2019-10-25 DIAGNOSIS — Z79899 Other long term (current) drug therapy: Secondary | ICD-10-CM

## 2019-10-25 DIAGNOSIS — M159 Polyosteoarthritis, unspecified: Secondary | ICD-10-CM

## 2019-10-25 DIAGNOSIS — M8949 Other hypertrophic osteoarthropathy, multiple sites: Secondary | ICD-10-CM

## 2019-10-25 DIAGNOSIS — I2510 Atherosclerotic heart disease of native coronary artery without angina pectoris: Secondary | ICD-10-CM

## 2019-10-25 DIAGNOSIS — M5412 Radiculopathy, cervical region: Secondary | ICD-10-CM

## 2019-10-25 NOTE — Progress Notes (Signed)
Virtual Visit via Video Note  I connected with@ on 10/25/19 at  2:00 PM EST by a video enabled telemedicine application and verified that I am speaking with the correct person using two identifiers. Location patient: home Location provider:work  office Persons participating in the virtual visit: patient, provider  [place] national recommendations  regarding COVID 19 pandemic   video visit is advised over in office visit for this patient.  Patient aware  of the limitations of evaluation and management by telemedicine and  availability of in person appointments. and agreed to proceed.   HPI: NHIA MCNICHOL presents for video visit  For progressing neck pain that radiations to right and when turns head to right over recent days and weeks  [place] excruciating [place] thought was going to have to go to ed  She is on lyrica  500 bid  Dec from tid cause concern about weight gain  Takes Celebrex tylenol also  Nos weakness but sometimes hard to  Use right hand arm . Has known djd spine and  Has had  Ct scans in past   Is at her end at times for the pain ongoing  ROS: See pertinent positives and negatives per HPI. No fever falling other new neuro sx   Past Medical History:  Diagnosis Date  . ALLERGIC RHINITIS   . Allergy   . Anginal pain ([place])   . Blood transfusion without reported diagnosis   . Cataract    small right  . Cholelithiasis 06/09/2008   gallstones 2009 on ct scan  . Complication of anesthesia 2012   went to ICU after bilateral knees , unstable vital signs oct 2012, has surgery since did ok  . Coronary artery disease    30 % narrowing lad  . Diverticulitis   . EPICONDYLITIS, LATERAL    osteoarthritis, previous joint replacements  . Fall 10/2018  . GERD   . Hepatic hemangioma 06/09/08  . Hiatal hernia 03/2000  . Hx of adenomatous polyp of colon 06/05/08  . Hx of cardiac catheterization 2008    clean coronarys DrKelly   . Hx of chest pain 2003   neg cath remot  hx of narrowwing lad in 2003 nl 2008, none in many years  . HYPERLIPIDEMIA   . IBS (irritable bowel syndrome)   . LEUKOPENIA, CHRONIC   . Mononucleosis 1963  . RAYNAUD'S SYNDROME, HX OF    improved after cardiac meds initiated  . Rosacea    facial  . Sleep apnea   . SLEEP APNEA, OBSTRUCTIVE    cpap, settings "automatic" settings 11-12  . Subacute thyroiditis   . UNSPECIFIED ANEMIA     Past Surgical History:  Procedure Laterality Date  . ABDOMINAL HYSTERECTOMY  1993   bso  . BACK SURGERY  2008   l 3 to l4 l4 to l5  . BARTHOLIN GLAND CYST EXCISION Right 09/09/2019   Procedure: EXCISION OF VULVAR MASS TIMES TWO;  Surgeon: Megan Salon, MD;  Location: [facility];  Service: Gynecology;  Laterality: Right;  . CARDIAC CATHETERIZATION  04/07/2007   Noncritical coronary artery disease. Contiue medical therapy.  Marland Kitchen CARDIAC CATHETERIZATION  12/03/2007   Normal LV function. Mild angiographic mitral valve prolapse. Normal coronary arteries.  Marland Kitchen CARDIOVASCULAR STRESS TEST  11/09/2007   Moderate ischemia in Mid Anterior, Mid Anteroseptal, Apical Anterior, and Apical Septal regions. EKG negative for ischemia.  Marland Kitchen [place]  . dental implants  11/2002,11/2011,11/2012  .  dental implants     x 6  . DILATION AND CURETTAGE OF UTERUS     d and e 1988 and 1990  . JOINT REPLACEMENT  09/21/2011   bilateral knee  . KNEE ARTHROSCOPY     bilateral  . lumbar surgery fixation with disc replacement  10/08   Left  . NASAL SEPTOPLASTY W/ TURBINOPLASTY  05/2000  . NOCTURNAL POLYSOMNOGRAM  12/31/2006   Severe obstructive sleep apnea. AHI-87/hr  . REPLACEMENT TOTAL KNEE  2012   bilateral  . TONSILLECTOMY  1952   adenoids too  . TOTAL HIP ARTHROPLASTY Left 10/21/2013   Procedure: LEFT TOTAL HIP ARTHROPLASTY ANTERIOR APPROACH;  Surgeon: [name], MD;  Location: [facility];  Service: Orthopedics;  Laterality: Left;  . TOTAL HIP ARTHROPLASTY Right 07/16/2014   Procedure:  RIGHT TOTAL HIP ARTHROPLASTY ANTERIOR APPROACH;  Surgeon: [name], MD;  Location: [facility];  Service: Orthopedics;  Laterality: Right;  . TRANSTHORACIC ECHOCARDIOGRAM  11/09/2007   EF [id number], LV systolic function normal. Mild aortic root dilation.  . WRIST SURGERY Left 04/03/14   ORIF "distal radial head fracture"    Family History  Problem Relation Age of Onset  . Rheum arthritis Mother   . Diabetes Mother   . Heart failure Mother   . Thrombocytopenia Mother   . Sleep apnea Mother   . Ulcers Mother        PUD  . Deep vein thrombosis Father   . Pulmonary embolism Father   . Osteoarthritis Father   . Atrial fibrillation Father   . Dementia Father   . Sleep apnea Father   . Sleep apnea Brother   . Obesity Brother   . Sleep apnea Brother   . Obesity Brother   . Colon cancer Maternal Aunt 43  . Colon cancer Maternal Aunt 90  . Pancreatic cancer Other   . Uterine cancer Other   . Leukemia Other   . Inflammatory bowel disease Other        aunt  . Congenital adrenal hyperplasia Grandchild   . Atrial fibrillation Brother   . Esophageal cancer Neg Hx   . Rectal cancer Neg Hx   . Stomach cancer Neg Hx     Social History   Tobacco Use  . Smoking status: Never Smoker  . Smokeless tobacco: Never Used  Substance Use Topics  . Alcohol use: Yes    Alcohol/week: 14.0 standard drinks    Types: 14 Glasses of wine per week    Comment: wine 2 glasses per day  . Drug use: No      Current Outpatient Medications:  .  acetaminophen (TYLENOL) 500 MG tablet, Take 1,000 mg by mouth every 6 (six) hours as needed for mild pain or headache. , Disp: , Rfl:  .  Alum Hydroxide-Mag Carbonate (GAVISCON PO), Take 1 tablet by mouth 4 (four) times daily as needed (heart burn). , Disp: , Rfl:  .  amLODipine (NORVASC) 5 MG tablet, TAKE 1 TABLET BY MOUTH EVERY DAY (Patient taking differently: Takes at 600 pm), Disp: 90 tablet, Rfl: 0 .  amoxicillin (AMOXIL) 500 MG capsule, 2,000 mg. 1 hour prior to  dental procedures., Disp: , Rfl:  .  aspirin 81 MG tablet, Take 81 mg by mouth daily., Disp: , Rfl:  .  atorvastatin (LIPITOR) 20 MG tablet, TAKE 1 TABLET BY MOUTH EVERY DAY AT 6PM, Disp: 90 tablet, Rfl: 0 .  Azelaic Acid (FINACEA) 15 % cream, Apply 1 application topically 2 (two)  times daily. After skin is thoroughly washed and patted dry, gently but thoroughly massage a thin film of azelaic acid cream into the affected area t daily,after shower., Disp: , Rfl:  .  carvedilol (COREG) 3.125 MG tablet, TAKE 1 TABLET BY MOUTH TWICE A DAY WITH MEALS, Disp: 180 tablet, Rfl: 1 .  celecoxib (CELEBREX) 200 MG capsule, TAKE 1 CAPSULE BY MOUTH EVERY DAY, Disp: 90 capsule, Rfl: 1 .  Cholecalciferol (VITAMIN D3) 2000 UNITS TABS, Take 1 tablet by mouth daily., Disp: , Rfl:  .  clindamycin (CLEOCIN T) 1 % external solution, 2 (two) times daily as needed., Disp: , Rfl: 3 .  clobetasol ointment (TEMOVATE) 0.05 %, Apply AA of skin BID prn, Disp: , Rfl: 0 .  diclofenac sodium (VOLTAREN) 1 % GEL, Apply 2-4 g topically 4 (four) times daily. As needed for  Arthritis  Pain., Disp: 4 Tube, Rfl: 1 .  DULoxetine (CYMBALTA) 60 MG capsule, TAKE 1 CAPSULE (60 MG TOTAL) BY MOUTH EVERY EVENING., Disp: 90 capsule, Rfl: 1 .  estradiol (ESTRACE) 0.5 MG tablet, 1/2 tab every day.  Please plan to try and decrease to every other day and see how symptoms are with this decrease., Disp: 45 tablet, Rfl: 3 .  fexofenadine (ALLEGRA) 180 MG tablet, Take 180 mg by mouth daily. Takes in PM, Disp: , Rfl:  .  fluocinonide (LIDEX) 0.05 % external solution, Apply AA on scalp PRN, Disp: , Rfl: 0 .  metroNIDAZOLE (METROGEL) 0.75 % gel, APPLY TO AFFECTED AREA TWICE A DAY, Disp: , Rfl:  .  Multiple Vitamins-Minerals (CENTRUM ADULTS PO), Take 1 tablet by mouth daily., Disp: , Rfl:  .  nitroGLYCERIN (NITROSTAT) 0.4 MG SL tablet, PLACE 1 TABLET UNDER THE TONGUE EVERY 5 MINUTES AS NEEDED FOR CHEST PAIN, Disp: 25 tablet, Rfl: 3 .  omeprazole-sodium  bicarbonate (ZEGERID) 40-1100 MG per capsule, Take 1 capsule by mouth daily before breakfast. , Disp: , Rfl:  .  pregabalin (LYRICA) 50 MG capsule, TAKE 1 CAPSULE BY MOUTH THREE TIMES A DAY (Patient taking differently: Take 50 mg by mouth 2 (two) times daily. ), Disp: 270 capsule, Rfl: 0 .  ramipril (ALTACE) 10 MG capsule, TAKE 1 CAPSULE BY MOUTH EVERY DAY (Patient taking differently: Takes at 600 pm), Disp: 30 capsule, Rfl: 5 .  valACYclovir (VALTREX) 1000 MG tablet, Take 2 grams po x 2 doses with fever blisters, Disp: 30 tablet, Rfl: 1  EXAM: BP Readings from Last 3 Encounters:  10/01/19 125/72  09/23/19 125/60  09/20/19 110/60    VITALS per patient if applicable:  GENERAL: alert, oriented, appears well and in no acute distress  HEENT: atraumatic, conjunttiva clear, no obvious abnormalities on inspection of external nose and ears  NECK:   Pain with movement ti right ?   LUNGS: on inspection no signs of respiratory distress, breathing rate appears normal, no obvious gross SOB, gasping or wheezing  CV: no obvious cyanosis  PSYCH/NEURO: pleasant and cooperative, no obvious depression or anxiety, speech and thought processing grossly intact Lab Results  Component Value Date   WBC 4.6 09/05/2019   HGB 12.2 09/05/2019   HCT 37.0 09/05/2019   PLT 238 09/05/2019   GLUCOSE 87 09/23/2019   CHOL 155 06/17/2019   TRIG 100 06/17/2019   HDL 78 06/17/2019   LDLCALC 57 06/17/2019   ALT 31 06/17/2019   AST 41 (H) 06/17/2019   NA 139 09/23/2019   K 4.5 09/23/2019   CL 105 09/23/2019   CREATININE 0.72  09/23/2019   BUN 25 (H) 09/23/2019   CO2 28 09/23/2019   TSH 1.840 06/17/2019   INR 0.97 07/10/2014   HGBA1C 5.4 02/05/2018    ASSESSMENT AND PLAN:  Discussed the following assessment and plan:    ICD-10-CM   1. Right cervical radiculopathy  M54.12 Ambulatory referral to Neurosurgery  2. Medication management  Z79.899   3. Primary osteoarthritis involving multiple joints  M89.49     Progressing neck pain     Try inc  lyrica to 100 mg at night  And cont 50 in am  Contact in 1 weeks abou t how doing    Plan  Referral for opinion about other options ? If candidate for injections other interventions  For her pain control  ? Need for re imaging?  Counseled.   Expectant management and discussion of plan and treatment with opportunity to ask questions and all were answered. The patient agreed with the plan and demonstrated an understanding of the instructions.   Advised to call back or seek an in-person evaluation if worsening  or having  further concerns . Return for update  on how doing in 1 week on inc lyrica. Shanon Ace, MD

## 2019-11-05 ENCOUNTER — Other Ambulatory Visit (INDEPENDENT_AMBULATORY_CARE_PROVIDER_SITE_OTHER): Payer: Medicare Other

## 2019-11-05 ENCOUNTER — Other Ambulatory Visit: Payer: Self-pay

## 2019-11-05 DIAGNOSIS — M8949 Other hypertrophic osteoarthropathy, multiple sites: Secondary | ICD-10-CM

## 2019-11-05 DIAGNOSIS — R945 Abnormal results of liver function studies: Secondary | ICD-10-CM | POA: Diagnosis not present

## 2019-11-05 DIAGNOSIS — Z79899 Other long term (current) drug therapy: Secondary | ICD-10-CM

## 2019-11-05 DIAGNOSIS — M159 Polyosteoarthritis, unspecified: Secondary | ICD-10-CM

## 2019-11-05 DIAGNOSIS — R7989 Other specified abnormal findings of blood chemistry: Secondary | ICD-10-CM

## 2019-11-05 LAB — BASIC METABOLIC PANEL
BUN: 22 mg/dL (ref 6–23)
CO2: 28 mEq/L (ref 19–32)
Calcium: 9 mg/dL (ref 8.4–10.5)
Chloride: 105 mEq/L (ref 96–112)
Creatinine, Ser: 0.64 mg/dL (ref 0.40–1.20)
GFR: 90.52 mL/min (ref >60.00)
Glucose, Bld: 105 mg/dL — ABNORMAL HIGH (ref 70–99)
Potassium: 4.3 mEq/L (ref 3.5–5.1)
Sodium: 141 mEq/L (ref 135–145)

## 2019-11-05 LAB — HEPATIC FUNCTION PANEL
ALT: 36 U/L — ABNORMAL HIGH (ref 0–35)
AST: 44 U/L — ABNORMAL HIGH (ref 0–37)
Albumin: 4.4 g/dL (ref 3.5–5.2)
Alkaline Phosphatase: 87 U/L (ref 39–117)
Bilirubin, Direct: 0.1 mg/dL (ref 0.0–0.3)
Total Bilirubin: 0.5 mg/dL (ref 0.2–1.2)
Total Protein: 6.4 g/dL (ref 6.0–8.3)

## 2019-11-12 DIAGNOSIS — M5412 Radiculopathy, cervical region: Secondary | ICD-10-CM | POA: Diagnosis not present

## 2019-11-12 DIAGNOSIS — Z6838 Body mass index (BMI) 38.0-38.9, adult: Secondary | ICD-10-CM | POA: Diagnosis not present

## 2019-11-12 DIAGNOSIS — I10 Essential (primary) hypertension: Secondary | ICD-10-CM | POA: Diagnosis not present

## 2019-11-12 DIAGNOSIS — M4722 Other spondylosis with radiculopathy, cervical region: Secondary | ICD-10-CM | POA: Diagnosis not present

## 2019-11-12 DIAGNOSIS — M5030 Other cervical disc degeneration, unspecified cervical region: Secondary | ICD-10-CM | POA: Diagnosis not present

## 2019-11-12 DIAGNOSIS — M542 Cervicalgia: Secondary | ICD-10-CM | POA: Diagnosis not present

## 2019-11-13 ENCOUNTER — Other Ambulatory Visit: Payer: Self-pay | Admitting: Cardiovascular Disease

## 2019-11-13 DIAGNOSIS — M4802 Spinal stenosis, cervical region: Secondary | ICD-10-CM | POA: Diagnosis not present

## 2019-11-13 DIAGNOSIS — M5412 Radiculopathy, cervical region: Secondary | ICD-10-CM | POA: Diagnosis not present

## 2019-11-13 DIAGNOSIS — M5030 Other cervical disc degeneration, unspecified cervical region: Secondary | ICD-10-CM | POA: Diagnosis not present

## 2019-11-13 DIAGNOSIS — M4726 Other spondylosis with radiculopathy, lumbar region: Secondary | ICD-10-CM | POA: Diagnosis not present

## 2019-11-13 DIAGNOSIS — Z6838 Body mass index (BMI) 38.0-38.9, adult: Secondary | ICD-10-CM | POA: Diagnosis not present

## 2019-12-08 ENCOUNTER — Other Ambulatory Visit: Payer: Self-pay | Admitting: Internal Medicine

## 2019-12-12 DIAGNOSIS — D239 Other benign neoplasm of skin, unspecified: Secondary | ICD-10-CM | POA: Diagnosis not present

## 2019-12-12 DIAGNOSIS — L723 Sebaceous cyst: Secondary | ICD-10-CM | POA: Diagnosis not present

## 2019-12-12 DIAGNOSIS — L578 Other skin changes due to chronic exposure to nonionizing radiation: Secondary | ICD-10-CM | POA: Diagnosis not present

## 2019-12-12 DIAGNOSIS — L821 Other seborrheic keratosis: Secondary | ICD-10-CM | POA: Diagnosis not present

## 2020-01-04 ENCOUNTER — Other Ambulatory Visit: Payer: Self-pay | Admitting: Cardiovascular Disease

## 2020-01-07 DIAGNOSIS — H35363 Drusen (degenerative) of macula, bilateral: Secondary | ICD-10-CM | POA: Diagnosis not present

## 2020-01-07 DIAGNOSIS — H2513 Age-related nuclear cataract, bilateral: Secondary | ICD-10-CM | POA: Diagnosis not present

## 2020-01-07 DIAGNOSIS — H04123 Dry eye syndrome of bilateral lacrimal glands: Secondary | ICD-10-CM | POA: Diagnosis not present

## 2020-01-07 DIAGNOSIS — H40013 Open angle with borderline findings, low risk, bilateral: Secondary | ICD-10-CM | POA: Diagnosis not present

## 2020-01-14 DIAGNOSIS — G4733 Obstructive sleep apnea (adult) (pediatric): Secondary | ICD-10-CM | POA: Diagnosis not present

## 2020-01-15 DIAGNOSIS — Z0120 Encounter for dental examination and cleaning without abnormal findings: Secondary | ICD-10-CM | POA: Diagnosis not present

## 2020-01-20 ENCOUNTER — Other Ambulatory Visit: Payer: Self-pay | Admitting: Cardiovascular Disease

## 2020-01-20 ENCOUNTER — Other Ambulatory Visit: Payer: Self-pay | Admitting: Internal Medicine

## 2020-02-25 DIAGNOSIS — Z471 Aftercare following joint replacement surgery: Secondary | ICD-10-CM | POA: Diagnosis not present

## 2020-02-25 DIAGNOSIS — Z96641 Presence of right artificial hip joint: Secondary | ICD-10-CM | POA: Diagnosis not present

## 2020-02-25 DIAGNOSIS — M7061 Trochanteric bursitis, right hip: Secondary | ICD-10-CM | POA: Diagnosis not present

## 2020-02-25 DIAGNOSIS — Z96643 Presence of artificial hip joint, bilateral: Secondary | ICD-10-CM | POA: Diagnosis not present

## 2020-03-02 ENCOUNTER — Ambulatory Visit: Payer: Medicare Other | Admitting: Internal Medicine

## 2020-03-02 ENCOUNTER — Other Ambulatory Visit: Payer: Self-pay | Admitting: Obstetrics & Gynecology

## 2020-03-02 NOTE — Telephone Encounter (Signed)
Medication refill request: valtrex 1000mg  Last AEX:  06-17-2019 with Dr Sabra Heck Next AEX: 08-07-2020 with Dr Sabra Heck Last MMG (if hormonal medication request): n/a Refill authorized: Dr Sabra Heck out of office. Routed to Dr Talbert Nan. Please approve if appropriate.

## 2020-03-10 DIAGNOSIS — M4722 Other spondylosis with radiculopathy, cervical region: Secondary | ICD-10-CM | POA: Diagnosis not present

## 2020-03-10 DIAGNOSIS — M5412 Radiculopathy, cervical region: Secondary | ICD-10-CM | POA: Diagnosis not present

## 2020-03-10 DIAGNOSIS — M5030 Other cervical disc degeneration, unspecified cervical region: Secondary | ICD-10-CM | POA: Diagnosis not present

## 2020-03-15 NOTE — Progress Notes (Signed)
This visit occurred during the SARS-CoV-2 public health emergency.  Safety protocols were in place, including screening questions prior to the visit, additional usage of staff PPE, and extensive cleaning of exam room while observing appropriate contact time as indicated for disinfecting solutions.    Chief Complaint  Patient presents with  . Follow-up    Has some things to discuss    HPI: Terri Rodriguez 74 y.o. come in for Chronic disease management  DJD  nudelman    Retiring    Spine pain stable  Neuro pain lyrica 50 am 100 pm seems to be helping pain as much possible and wants to stay on that regimen  Making it harder to lsoe weight  Periodontal  Surgery needed  Not sure why  [employer]  Vaccine  Completed about   2 mos ago.   No new sx   ROS: See pertinent positives and negatives per HPI. No new cp sob neuro sx   No active  No tob  etoh 1-2 per night Past Medical History:  Diagnosis Date  . ALLERGIC RHINITIS   . Allergy   . Anginal pain ([place])   . Blood transfusion without reported diagnosis   . Cataract    small right  . Cholelithiasis 06/09/2008   gallstones 2009 on ct scan  . Complication of anesthesia 2012   went to ICU after bilateral knees , unstable vital signs oct 2012, has surgery since did ok  . Coronary artery disease    30 % narrowing lad  . Diverticulitis   . EPICONDYLITIS, LATERAL    osteoarthritis, previous joint replacements  . Fall 10/2018  . GERD   . Hepatic hemangioma 06/09/08  . Hiatal hernia 03/2000  . Hx of adenomatous polyp of colon 06/05/08  . Hx of cardiac catheterization 2008    clean coronarys DrKelly   . Hx of chest pain 2003   neg cath remot hx of narrowwing lad in 2003 nl 2008, none in many years  . HYPERLIPIDEMIA   . IBS (irritable bowel syndrome)   . LEUKOPENIA, CHRONIC   . Mononucleosis 1963  . RAYNAUD'S SYNDROME, HX OF    improved after cardiac meds initiated  . Rosacea    facial  . Sleep apnea   . SLEEP APNEA, OBSTRUCTIVE    cpap,  settings "automatic" settings 11-12  . Subacute thyroiditis   . UNSPECIFIED ANEMIA     Family History  Problem Relation Age of Onset  . Rheum arthritis Mother   . Diabetes Mother   . Heart failure Mother   . Thrombocytopenia Mother   . Sleep apnea Mother   . Ulcers Mother        PUD  . Deep vein thrombosis Father   . Pulmonary embolism Father   . Osteoarthritis Father   . Atrial fibrillation Father   . Dementia Father   . Sleep apnea Father   . Sleep apnea Brother   . Obesity Brother   . Sleep apnea Brother   . Obesity Brother   . Colon cancer Maternal Aunt 53  . Colon cancer Maternal Aunt 90  . Pancreatic cancer Other   . Uterine cancer Other   . Leukemia Other   . Inflammatory bowel disease Other        aunt  . Congenital adrenal hyperplasia Grandchild   . Atrial fibrillation Brother   . Esophageal cancer Neg Hx   . Rectal cancer Neg Hx   . Stomach cancer Neg Hx  Social History   Socioeconomic History  . Marital status: Married    Spouse name: Not on file  . Number of children: 1  . Years of education: Not on file  . Highest education level: Not on file  Occupational History  . Occupation: Physician  Tobacco Use  . Smoking status: Never Smoker  . Smokeless tobacco: Never Used  Substance and Sexual Activity  . Alcohol use: Yes    Alcohol/week: 14.0 standard drinks    Types: 14 Glasses of wine per week    Comment: wine 2 glasses per day  . Drug use: No  . Sexual activity: Yes    Partners: Male    Birth control/protection: Surgical    Comment: TAH/BSO  Other Topics Concern  . 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   Social Determinants of Health   Financial Resource Strain:   . Difficulty of Paying Living Expenses:   Food Insecurity:   . Worried About Charity fundraiser in the Last Year:   . Arboriculturist in the Last Year:     Transportation Needs:   . Film/video editor (Medical):   Marland Kitchen Lack of Transportation (Non-Medical):   Physical Activity:   . Days of Exercise per Week:   . Minutes of Exercise per Session:   Stress:   . Feeling of Stress :   Social Connections:   . Frequency of Communication with Friends and Family:   . Frequency of Social Gatherings with Friends and Family:   . Attends Religious Services:   . Active Member of Clubs or Organizations:   . Attends Archivist Meetings:   Marland Kitchen Marital Status:     Outpatient Medications Prior to Visit  Medication Sig Dispense Refill  . acetaminophen (TYLENOL) 500 MG tablet Take 1,000 mg by mouth every 6 (six) hours as needed for mild pain or headache.     Marland Kitchen amLODipine (NORVASC) 5 MG tablet TAKE 1 TABLET BY MOUTH EVERY DAY 90 tablet 3  . amoxicillin (AMOXIL) 500 MG capsule 2,000 mg. 1 hour prior to dental procedures.    Marland Kitchen aspirin 81 MG tablet Take 81 mg by mouth daily.    Marland Kitchen atorvastatin (LIPITOR) 20 MG tablet TAKE 1 TABLET BY MOUTH EVERY DAY AT 6PM 90 tablet 3  . Azelaic Acid (FINACEA) 15 % cream Apply 1 application topically 2 (two) times daily. After skin is thoroughly washed and patted dry, gently but thoroughly massage a thin film of azelaic acid cream into the affected area t daily,after shower.    . carvedilol (COREG) 3.125 MG tablet TAKE 1 TABLET BY MOUTH TWICE A DAY WITH MEALS 180 tablet 1  . celecoxib (CELEBREX) 200 MG capsule TAKE 1 CAPSULE BY MOUTH EVERY DAY 90 capsule 1  . Cholecalciferol (VITAMIN D3) 2000 UNITS TABS Take 1 tablet by mouth daily.    . clindamycin (CLEOCIN T) 1 % external solution 2 (two) times daily as needed.  3  . clobetasol ointment (TEMOVATE) 0.05 % Apply AA of skin BID prn  0  . diclofenac sodium (VOLTAREN) 1 % GEL Apply 2-4 g topically 4 (four) times daily. As needed for  Arthritis  Pain. 4 Tube 1  . DULoxetine (CYMBALTA) 60 MG capsule TAKE 1 CAPSULE (60 MG TOTAL) BY MOUTH EVERY EVENING. 90 capsule 1  .  estradiol (ESTRACE) 0.5 MG tablet 1/2 tab every day.  Please plan to try and decrease to every other day and see how symptoms are with this decrease. 45 tablet 3  . fexofenadine (ALLEGRA) 180 MG tablet Take 180 mg by mouth daily. Takes in PM    . ketoconazole (NIZORAL) 2 % cream SMARTSIG:1 Application Topical 1 to 2 Times Daily    . metroNIDAZOLE (METROGEL) 0.75 % gel APPLY TO AFFECTED AREA TWICE A DAY    . Multiple Vitamins-Minerals (CENTRUM ADULTS PO) Take 1 tablet by mouth daily.    . nitroGLYCERIN (NITROSTAT) 0.4 MG SL tablet PLACE 1 TABLET UNDER THE TONGUE EVERY 5 MINUTES AS NEEDED FOR CHEST PAIN 25 tablet 3  . omeprazole-sodium bicarbonate (ZEGERID) 40-1100 MG per capsule Take 1 capsule by mouth daily before breakfast.     . pregabalin (LYRICA) 50 MG capsule TAKE 1 CAPSULE BY MOUTH THREE TIMES A DAY 270 capsule 1  . ramipril (ALTACE) 10 MG capsule TAKE 1 CAPSULE BY MOUTH EVERY DAY 90 capsule 2  . valACYclovir (VALTREX) 1000 MG tablet TAKE 2 TABLET BY MOUTH Q12 hours X 2 DOSES WITH FEVER BLISTERS 30 tablet 1  . Alum Hydroxide-Mag Carbonate (GAVISCON PO) Take 1 tablet by mouth 4 (four) times daily as needed (heart burn).     . fluocinonide (LIDEX) 0.05 % external solution Apply AA on scalp PRN  0   No facility-administered medications prior to visit.     EXAM:  BP 114/64   Pulse 71   Temp (!) 96 F (35.6 C) (Temporal)   Ht 5\' 6"  (1.676 m)   Wt 236 lb 9.6 oz (107.3 kg)   SpO2 98%   BMI 38.19 kg/m   Body mass index is 38.19 kg/m. Wt Readings from Last 3 Encounters:  03/16/20 236 lb 9.6 oz (107.3 kg)  10/01/19 238 lb 12.8 oz (108.3 kg)  09/23/19 238 lb 9.6 oz (108.2 kg)    GENERAL: vitals reviewed and listed above, alert, oriented, appears well hydrated and in no acute distress HEENT: atraumatic, conjunctiva  clear, no obvious abnormalities on inspection of external nose and ears OP : masked  NECK: no obvious masses on inspection palpation  LUNGS: clear to auscultation  bilaterally, no wheezes, rales or rhonchi, good air movement CV: HRRR, no clubbing cyanosis or  peripheral edema nl cap refill  MS: moves all extremities without noticeable focal  Abnormality DJD deformity changes   Some pain getting up on table  Le dec reflexes no clonus  PSYCH: pleasant and cooperative,   Mood stable  Lab Results  Component Value Date   WBC 4.6 09/05/2019   HGB 12.2 09/05/2019   HCT 37.0 09/05/2019   PLT 238 09/05/2019   GLUCOSE 105 (H) 11/05/2019   CHOL 155 06/17/2019   TRIG 100 06/17/2019   HDL 78 06/17/2019   LDLCALC 57 06/17/2019   ALT 36 (H) 11/05/2019   AST 44 (H) 11/05/2019   NA 141 11/05/2019   K 4.3 11/05/2019   CL 105 11/05/2019   CREATININE 0.64 11/05/2019   BUN 22 11/05/2019   CO2 28 11/05/2019   TSH 1.840 06/17/2019   INR 0.97 07/10/2014   HGBA1C 5.4 02/05/2018   BP Readings from Last 3 Encounters:  03/16/20 114/64  10/01/19 125/72  09/23/19 125/60    ASSESSMENT AND PLAN:  Discussed the following assessment and plan:  Medication management - Plan: Hepatic function panel, Basic metabolic panel, Hemoglobin A1c  Abnormal LFTs - Plan: Hepatic function panel,  Basic metabolic panel, Hemoglobin A1c  Primary osteoarthritis involving multiple joints  Hyperglycemia - Plan: Hepatic function panel, Basic metabolic panel, Hemoglobin A1c  Primary osteoarthritis of both feet  Degenerative disc disease, lumbar status post fusion  Primary osteoarthritis of both hands  Other chronic pain On Celebrex lab monitoring  bg cr and lfts   ly rica modestly helpful will stay at same dose at this time  -Patient advised to return or notify health care team  if  new concerns arise. In interim   Plan visit cpx or ov in 4-6 months  Or as needed   Patient Instructions  Plan lab today .  Otherwise   Preventive visit in 4-6 months      Standley Brooking. Macy Lingenfelter M.D. Outside external source  DATA REVIEWED: record  Specialty   Total time on date  of service  including record review ordering and plan of care:  30 minutes

## 2020-03-16 ENCOUNTER — Other Ambulatory Visit: Payer: Self-pay | Admitting: Obstetrics and Gynecology

## 2020-03-16 ENCOUNTER — Ambulatory Visit (INDEPENDENT_AMBULATORY_CARE_PROVIDER_SITE_OTHER): Payer: Medicare Other | Admitting: Internal Medicine

## 2020-03-16 ENCOUNTER — Encounter: Payer: Self-pay | Admitting: Internal Medicine

## 2020-03-16 ENCOUNTER — Other Ambulatory Visit: Payer: Self-pay

## 2020-03-16 VITALS — BP 114/64 | HR 71 | Temp 96.0°F | Ht 66.0 in | Wt 236.6 lb

## 2020-03-16 DIAGNOSIS — M19071 Primary osteoarthritis, right ankle and foot: Secondary | ICD-10-CM

## 2020-03-16 DIAGNOSIS — M19072 Primary osteoarthritis, left ankle and foot: Secondary | ICD-10-CM

## 2020-03-16 DIAGNOSIS — R739 Hyperglycemia, unspecified: Secondary | ICD-10-CM | POA: Diagnosis not present

## 2020-03-16 DIAGNOSIS — R7989 Other specified abnormal findings of blood chemistry: Secondary | ICD-10-CM

## 2020-03-16 DIAGNOSIS — G8929 Other chronic pain: Secondary | ICD-10-CM

## 2020-03-16 DIAGNOSIS — M5136 Other intervertebral disc degeneration, lumbar region: Secondary | ICD-10-CM

## 2020-03-16 DIAGNOSIS — Z79899 Other long term (current) drug therapy: Secondary | ICD-10-CM | POA: Diagnosis not present

## 2020-03-16 DIAGNOSIS — M8949 Other hypertrophic osteoarthropathy, multiple sites: Secondary | ICD-10-CM | POA: Diagnosis not present

## 2020-03-16 DIAGNOSIS — M159 Polyosteoarthritis, unspecified: Secondary | ICD-10-CM

## 2020-03-16 DIAGNOSIS — R945 Abnormal results of liver function studies: Secondary | ICD-10-CM | POA: Diagnosis not present

## 2020-03-16 DIAGNOSIS — M19042 Primary osteoarthritis, left hand: Secondary | ICD-10-CM

## 2020-03-16 DIAGNOSIS — M19041 Primary osteoarthritis, right hand: Secondary | ICD-10-CM

## 2020-03-16 NOTE — Patient Instructions (Addendum)
Plan lab today .  Otherwise   Preventive visit in 4-6 months

## 2020-03-17 DIAGNOSIS — M7061 Trochanteric bursitis, right hip: Secondary | ICD-10-CM | POA: Diagnosis not present

## 2020-03-17 DIAGNOSIS — M25551 Pain in right hip: Secondary | ICD-10-CM | POA: Diagnosis not present

## 2020-03-17 LAB — BASIC METABOLIC PANEL
BUN: 26 mg/dL — ABNORMAL HIGH (ref 6–23)
CO2: 28 mEq/L (ref 19–32)
Calcium: 9.3 mg/dL (ref 8.4–10.5)
Chloride: 104 mEq/L (ref 96–112)
Creatinine, Ser: 0.86 mg/dL (ref 0.40–1.20)
GFR: 64.31 mL/min (ref >60.00)
Glucose, Bld: 94 mg/dL (ref 70–99)
Potassium: 5.2 mEq/L — ABNORMAL HIGH (ref 3.5–5.1)
Sodium: 138 mEq/L (ref 135–145)

## 2020-03-17 LAB — HEPATIC FUNCTION PANEL
ALT: 49 U/L — ABNORMAL HIGH (ref 0–35)
AST: 52 U/L — ABNORMAL HIGH (ref 0–37)
Albumin: 4.5 g/dL (ref 3.5–5.2)
Alkaline Phosphatase: 85 U/L (ref 39–117)
Bilirubin, Direct: 0.1 mg/dL (ref 0.0–0.3)
Total Bilirubin: 0.5 mg/dL (ref 0.2–1.2)
Total Protein: 6.5 g/dL (ref 6.0–8.3)

## 2020-03-17 LAB — HEMOGLOBIN A1C: Hgb A1c MFr Bld: 5.4 % (ref 4.6–6.5)

## 2020-03-17 NOTE — Progress Notes (Signed)
Glucose good  liver about the same abnormality  Creatinine stable  Potassium back up\ Make sure no potassium supplements  Repeat lfts and bmp well hydrated in  1 month  ( no ov needed) dx elevated potassium and abn lfts

## 2020-03-17 NOTE — Telephone Encounter (Signed)
Medication refill request: Valtrex 1000mg  Last AEX:  06/17/2019 Next AEX: 09/03/2020 MM Last MMG (if hormonal medication request): n/a Refill authorized: #90, 1 RF  Rx for Valtrex 1000mg , #30, 1 RF sent on 03/02/2020 by Dr.Jertson.  Pharmacy requesting change from #30 to #90 supply.   Please advise and refill if appropriate.

## 2020-03-18 ENCOUNTER — Other Ambulatory Visit: Payer: Self-pay

## 2020-03-18 DIAGNOSIS — R945 Abnormal results of liver function studies: Secondary | ICD-10-CM

## 2020-03-18 DIAGNOSIS — R7989 Other specified abnormal findings of blood chemistry: Secondary | ICD-10-CM

## 2020-03-18 DIAGNOSIS — E875 Hyperkalemia: Secondary | ICD-10-CM

## 2020-03-18 NOTE — Telephone Encounter (Signed)
Please call pt.  She uses valtrex 1 gram, 2 tab x 1 and repeat in 12 hours, for fever blisters.  So #30 should give her treatment for 7 episodes of fever blisters.  I don't usually sent rx for #90 for this unless there is a specific reason.  Can you check with her and see if there is a reason for the #90?  Thanks.

## 2020-03-25 DIAGNOSIS — M25551 Pain in right hip: Secondary | ICD-10-CM | POA: Diagnosis not present

## 2020-03-25 NOTE — Telephone Encounter (Signed)
Called pt did not answer left vm to call back.

## 2020-04-13 DIAGNOSIS — G4733 Obstructive sleep apnea (adult) (pediatric): Secondary | ICD-10-CM | POA: Diagnosis not present

## 2020-04-16 ENCOUNTER — Other Ambulatory Visit: Payer: Self-pay

## 2020-04-16 ENCOUNTER — Other Ambulatory Visit: Payer: Self-pay | Admitting: Internal Medicine

## 2020-04-17 ENCOUNTER — Other Ambulatory Visit (INDEPENDENT_AMBULATORY_CARE_PROVIDER_SITE_OTHER): Payer: Medicare Other

## 2020-04-17 DIAGNOSIS — R945 Abnormal results of liver function studies: Secondary | ICD-10-CM | POA: Diagnosis not present

## 2020-04-17 DIAGNOSIS — E875 Hyperkalemia: Secondary | ICD-10-CM

## 2020-04-17 DIAGNOSIS — R7989 Other specified abnormal findings of blood chemistry: Secondary | ICD-10-CM

## 2020-04-17 LAB — BASIC METABOLIC PANEL
BUN: 22 mg/dL (ref 6–23)
CO2: 30 mEq/L (ref 19–32)
Calcium: 8.8 mg/dL (ref 8.4–10.5)
Chloride: 105 mEq/L (ref 96–112)
Creatinine, Ser: 0.61 mg/dL (ref 0.40–1.20)
GFR: 95.56 mL/min (ref >60.00)
Glucose, Bld: 97 mg/dL (ref 70–99)
Potassium: 4.5 mEq/L (ref 3.5–5.1)
Sodium: 141 mEq/L (ref 135–145)

## 2020-04-17 LAB — HEPATIC FUNCTION PANEL
ALT: 26 U/L (ref 0–35)
AST: 37 U/L (ref 0–37)
Albumin: 4.2 g/dL (ref 3.5–5.2)
Alkaline Phosphatase: 72 U/L (ref 39–117)
Bilirubin, Direct: 0.1 mg/dL (ref 0.0–0.3)
Total Bilirubin: 0.5 mg/dL (ref 0.2–1.2)
Total Protein: 6.1 g/dL (ref 6.0–8.3)

## 2020-04-17 NOTE — Progress Notes (Signed)
Repeat lab normal now

## 2020-06-02 ENCOUNTER — Other Ambulatory Visit: Payer: Self-pay | Admitting: Internal Medicine

## 2020-06-15 ENCOUNTER — Telehealth: Payer: Self-pay | Admitting: Internal Medicine

## 2020-06-15 NOTE — Telephone Encounter (Signed)
Patient blocked number

## 2020-06-25 DIAGNOSIS — G4733 Obstructive sleep apnea (adult) (pediatric): Secondary | ICD-10-CM | POA: Diagnosis not present

## 2020-07-16 ENCOUNTER — Other Ambulatory Visit: Payer: Self-pay | Admitting: Internal Medicine

## 2020-07-16 ENCOUNTER — Other Ambulatory Visit: Payer: Self-pay | Admitting: Obstetrics & Gynecology

## 2020-07-16 ENCOUNTER — Other Ambulatory Visit: Payer: Self-pay | Admitting: Cardiovascular Disease

## 2020-07-16 DIAGNOSIS — Z79890 Hormone replacement therapy (postmenopausal): Secondary | ICD-10-CM

## 2020-07-16 NOTE — Telephone Encounter (Signed)
Last OV 03/16/2020  Last filled 01/20/2020, # 270 with 1 refill

## 2020-07-16 NOTE — Telephone Encounter (Signed)
Medication refill request: Estradiol 0.5mg   Last AEX:  06/17/19 Next AEX: 08/07/20 Last MMG (if hormonal medication request): 09/04/19  Normal  Refill authorized: 45/0  Dr Ammie Ferrier patient - sent to Dr Quincy Simmonds due to Dr Sabra Heck being out of the office.   York Cerise, CMA

## 2020-07-16 NOTE — Telephone Encounter (Signed)
I would like to have Dr. Sabra Heck review this prescription when she returns.

## 2020-07-27 DIAGNOSIS — G4733 Obstructive sleep apnea (adult) (pediatric): Secondary | ICD-10-CM | POA: Diagnosis not present

## 2020-08-04 NOTE — Progress Notes (Signed)
74 y.o. G35P1 Married White or Caucasian female here for breast and pelvic exam.  I am also following her for HRT use.  Taking 1/2 tab 0.5mg  every other day.  Desires to continue.  Risks reviewed.  States she is aware of all risks and ok with them.  Would like to try topical testosterone for libido.  Has use in past.  Reviewed risks with pt.  She states she's not going to be her forever and would like this to be better.  Pt is a retired Engineer, drilling and quite knowledgeable.  Denies vaginal bleeding.    No LMP recorded. Patient has had a hysterectomy.          Sexually active: No.  H/O STD:  HSV 1  Health Maintenance: PCP:  Shanon Ace.  Last wellness appt was 1y ago.  Did blood work at that appt: yes Vaccines are up to date:  yes Colonoscopy:  07-10-2019  MMG:  09-04-2019 category b density birads 2:neg BMD:  09-04-2019 normal Last pap smear:  2011 neg.   H/o abnormal pap smear:  no    reports that she has never smoked. She has never used smokeless tobacco. She reports current alcohol use of about 14.0 standard drinks of alcohol per week. She reports that she does not use drugs.  Past Medical History:  Diagnosis Date   ALLERGIC RHINITIS    Allergy    Anginal pain ([place])    Blood transfusion without reported diagnosis    Cataract    small right   Cholelithiasis 06/09/2008   gallstones 2009 on ct scan   Complication of anesthesia 2012   went to ICU after bilateral knees , unstable vital signs oct 2012, has surgery since did ok   Coronary artery disease    30 % narrowing lad   Diverticulitis    EPICONDYLITIS, LATERAL    osteoarthritis, previous joint replacements   Fall 10/2018   GERD    Hepatic hemangioma 06/09/08   Hiatal hernia 03/2000   Hx of adenomatous polyp of colon 06/05/08   Hx of cardiac catheterization 2008    clean coronarys DrKelly    Hx of chest pain 2003   neg cath remot hx of narrowwing lad in 2003 nl 2008, none in many years   HYPERLIPIDEMIA    IBS  (irritable bowel syndrome)    LEUKOPENIA, CHRONIC    Mononucleosis 1963   RAYNAUD'S SYNDROME, HX OF    improved after cardiac meds initiated   Rosacea    facial   Sleep apnea    SLEEP APNEA, OBSTRUCTIVE    cpap, settings "automatic" settings 11-12   Subacute thyroiditis    UNSPECIFIED ANEMIA     Past Surgical History:  Procedure Laterality Date   ABDOMINAL HYSTERECTOMY  1993   bso   BACK SURGERY  2008   l 3 to l4 l4 to l5   BARTHOLIN GLAND CYST EXCISION Right 09/09/2019   Procedure: EXCISION OF VULVAR MASS TIMES TWO;  Surgeon: Megan Salon, MD;  Location: [facility];  Service: Gynecology;  Laterality: Right;   CARDIAC CATHETERIZATION  04/07/2007   Noncritical coronary artery disease. Contiue medical therapy.   CARDIAC CATHETERIZATION  12/03/2007   Normal LV function. Mild angiographic mitral valve prolapse. Normal coronary arteries.   CARDIOVASCULAR STRESS TEST  11/09/2007   Moderate ischemia in Mid Anterior, Mid Anteroseptal, Apical Anterior, and Apical Septal regions. EKG negative for ischemia.   [place]   dental implants  11/2002,11/2011,11/2012   dental implants     x 6   DENTAL SURGERY     DILATION AND CURETTAGE OF UTERUS     d and e 1988 and 1990   JOINT REPLACEMENT  09/21/2011   bilateral knee   KNEE ARTHROSCOPY     bilateral   lumbar surgery fixation with disc replacement  10/08   Left   NASAL SEPTOPLASTY W/ TURBINOPLASTY  05/2000   NOCTURNAL POLYSOMNOGRAM  12/31/2006   Severe obstructive sleep apnea. AHI-87/hr   REPLACEMENT TOTAL KNEE  2012   bilateral   TONSILLECTOMY  1952   adenoids too   TOTAL HIP ARTHROPLASTY Left 10/21/2013   Procedure: LEFT TOTAL HIP ARTHROPLASTY ANTERIOR APPROACH;  Surgeon: [name], MD;  Location: [facility];  Service: Orthopedics;  Laterality: Left;   TOTAL HIP ARTHROPLASTY Right 07/16/2014   Procedure: RIGHT TOTAL HIP ARTHROPLASTY ANTERIOR APPROACH;  Surgeon: [name], MD;  Location: [facility];  Service: Orthopedics;  Laterality: Right;   TRANSTHORACIC ECHOCARDIOGRAM  11/09/2007   EF 42%, LV systolic function normal. Mild aortic root dilation.   WRIST SURGERY Left 04/03/14   ORIF "distal radial head fracture"    Current Outpatient Medications  Medication Sig Dispense Refill   acetaminophen (TYLENOL) 500 MG tablet Take 1,000 mg by mouth every 6 (six) hours as needed for mild pain or headache.      amLODipine (NORVASC) 5 MG tablet TAKE 1 TABLET BY MOUTH EVERY DAY 90 tablet 3   aspirin 81 MG tablet Take 81 mg by mouth daily.     atorvastatin (LIPITOR) 20 MG tablet TAKE 1 TABLET BY MOUTH EVERY DAY AT 6PM 90 tablet 3   Azelaic Acid (FINACEA) 15 % cream Apply 1 application topically 2 (two) times daily. After skin is thoroughly washed and patted dry, gently but thoroughly massage a thin film of azelaic acid cream into the affected area t daily,after shower.     carvedilol (COREG) 3.125 MG tablet TAKE 1 TABLET BY MOUTH TWICE A DAY WITH MEALS 180 tablet 1   celecoxib (CELEBREX) 200 MG capsule TAKE 1 CAPSULE BY MOUTH EVERY DAY 90 capsule 1   Cholecalciferol (VITAMIN D3) 2000 UNITS TABS Take 1 tablet by mouth daily.     clindamycin (CLEOCIN T) 1 % external solution 2 (two) times daily as needed.  3   clobetasol ointment (TEMOVATE) 0.05 % Apply AA of skin BID prn  0   diclofenac sodium (VOLTAREN) 1 % GEL Apply 2-4 g topically 4 (four) times daily. As needed for  Arthritis  Pain. 4 Tube 1   DULoxetine (CYMBALTA) 60 MG capsule TAKE 1 CAPSULE (60 MG TOTAL) BY MOUTH EVERY EVENING. 90 capsule 1   estradiol (ESTRACE) 0.5 MG tablet 1/2 TAB EVERY DAY. PLEASE PLAN TO TRY AND DECREASE TO EVERY OTHER DAY AND SEE HOW SYMPTOMS ARE WITH THIS DECREASE. 45 tablet 0   fexofenadine (ALLEGRA) 180 MG tablet Take 180 mg by mouth daily. Takes in PM     fluocinonide (LIDEX) 0.05 % external solution Apply AA on scalp PRN  0   ketoconazole (NIZORAL) 2 % cream  SMARTSIG:1 Application Topical 1 to 2 Times Daily     metroNIDAZOLE (METROGEL) 0.75 % gel APPLY TO AFFECTED AREA TWICE A DAY     Multiple Vitamins-Minerals (CENTRUM ADULTS PO) Take 1 tablet by mouth daily.     omeprazole-sodium bicarbonate (ZEGERID) 40-1100 MG per capsule Take 1 capsule by mouth daily before breakfast.  pregabalin (LYRICA) 50 MG capsule TAKE 1 CAPSULE BY MOUTH THREE TIMES A DAY 270 capsule 0   ramipril (ALTACE) 10 MG capsule TAKE 1 CAPSULE BY MOUTH EVERY DAY 90 capsule 2   valACYclovir (VALTREX) 1000 MG tablet TAKE 2 TABLET BY MOUTH Q12 hours X 2 DOSES WITH FEVER BLISTERS 30 tablet 1   amoxicillin (AMOXIL) 500 MG capsule 2,000 mg. 1 hour prior to dental procedures. (Patient not taking: Reported on 08/07/2020)     nitroGLYCERIN (NITROSTAT) 0.4 MG SL tablet PLACE 1 TABLET UNDER THE TONGUE EVERY 5 MINUTES AS NEEDED FOR CHEST PAIN (Patient not taking: Reported on 08/07/2020) 25 tablet 3   No current facility-administered medications for this visit.    Family History  Problem Relation Age of Onset   Rheum arthritis Mother    Diabetes Mother    Heart failure Mother    Thrombocytopenia Mother    Sleep apnea Mother    Ulcers Mother        PUD   Deep vein thrombosis Father    Pulmonary embolism Father    Osteoarthritis Father    Atrial fibrillation Father    Dementia Father    Sleep apnea Father    Sleep apnea Brother    Obesity Brother    Sleep apnea Brother    Obesity Brother    Colon cancer Maternal Aunt 51   Colon cancer Maternal Aunt 90   Pancreatic cancer Other    Uterine cancer Other    Leukemia Other    Inflammatory bowel disease Other        aunt   Congenital adrenal hyperplasia Grandchild    Atrial fibrillation Brother    Esophageal cancer Neg Hx    Rectal cancer Neg Hx    Stomach cancer Neg Hx     Review of Systems  Constitutional: Negative.   HENT: Negative.   Eyes: Negative.   Respiratory: Negative.    Cardiovascular: Negative.   Gastrointestinal: Negative.   Endocrine: Negative.   Genitourinary: Negative.   Musculoskeletal: Negative.   Skin: Negative.   Allergic/Immunologic: Negative.   Neurological: Negative.   Hematological: Negative.   Psychiatric/Behavioral: Negative.     Exam:   BP 114/64    Pulse 70    Resp 16    Ht 5' 4.75" (1.645 m)    Wt 245 lb (111.1 kg)    BMI 41.09 kg/m   Height: 5' 4.75" (164.5 cm)  General appearance: alert, cooperative and appears stated age Breasts: normal appearance, no masses or tenderness Abdomen: soft, non-tender; bowel sounds normal; no masses,  no organomegaly Lymph nodes: Cervical, supraclavicular, and axillary nodes normal.  No abnormal inguinal nodes palpated Neurologic: Grossly normal  Pelvic: External genitalia:  no lesions              Urethra:  normal appearing urethra with no masses, tenderness or lesions              Bartholins and Skenes: normal                 Vagina: normal appearing vagina with normal color and discharge, no lesions              Cervix: absent              Pap taken: No. Bimanual Exam:  Uterus:  uterus absent              Adnexa: no mass, fullness, tenderness  Rectovaginal: Confirms               Anus:  normal sphincter tone, no lesions  Chaperone, Karmen Bongo, CMA, was present for exam.  A:  Breast and Pelvic exam PMP, on low dosed HRT Decreased libido H/o colonic polyps H/o oral HSV GERD, Obesity, OSA, elevated lipids S/p excision of labia mass 09/2019 with pathology showing possible epidermal cyst  P:   Mammogram guidelines reviewed. Doing yearly 3D. pap smear not indicated RF for estrace 0.5mg  1/2 tab daily.  #45/3RF Rx for topical testosterone cream 1mg /0.32ml.  Apply topically 0.57ml topically two times weekly.  Disp: 3 month supply.  #1RF.Will check total testosterone level in 3 months if helps. Return 1 year for recheck as is on HRT.  In addition to breast and pelvic exam,  25 minutes spent in discussion about HRT and treatment of libido concerns as well as risks

## 2020-08-07 ENCOUNTER — Other Ambulatory Visit: Payer: Self-pay

## 2020-08-07 ENCOUNTER — Ambulatory Visit (INDEPENDENT_AMBULATORY_CARE_PROVIDER_SITE_OTHER): Payer: Medicare Other | Admitting: Obstetrics & Gynecology

## 2020-08-07 ENCOUNTER — Encounter: Payer: Self-pay | Admitting: Obstetrics & Gynecology

## 2020-08-07 ENCOUNTER — Telehealth: Payer: Self-pay | Admitting: Obstetrics & Gynecology

## 2020-08-07 VITALS — BP 114/64 | HR 70 | Resp 16 | Ht 64.75 in | Wt 245.0 lb

## 2020-08-07 DIAGNOSIS — R6882 Decreased libido: Secondary | ICD-10-CM | POA: Diagnosis not present

## 2020-08-07 DIAGNOSIS — Z79890 Hormone replacement therapy (postmenopausal): Secondary | ICD-10-CM

## 2020-08-07 DIAGNOSIS — Z01419 Encounter for gynecological examination (general) (routine) without abnormal findings: Secondary | ICD-10-CM | POA: Diagnosis not present

## 2020-08-07 MED ORDER — VALACYCLOVIR HCL 1 G PO TABS: ORAL_TABLET | ORAL | 1 refills | Status: DC

## 2020-08-07 MED ORDER — ESTRADIOL 0.5 MG PO TABS: ORAL_TABLET | ORAL | 4 refills | Status: DC

## 2020-08-07 NOTE — Telephone Encounter (Signed)
Patient calling with medication information for Dr Sabra Heck. Medication is testosterone propionate and the date on bottle is 04/25/2016.

## 2020-08-07 NOTE — Telephone Encounter (Signed)
Spoke with patient.  Patient was in today for OV, calling to provide prescription information.  Requesting Rx for "testosterone propionate petrolatum 2% in ointment" to [facility].   Advised I will forward to Dr. Sabra Heck, our office will notify once RX has been sent. Patient is aware it takes at least 48 hours for pharmacy to prepare.   Routing to Dr. Sabra Heck

## 2020-08-13 MED ORDER — NONFORMULARY OR COMPOUNDED ITEM: 0 refills | Status: DC

## 2020-08-13 MED ORDER — NONFORMULARY OR COMPOUNDED ITEM
0 refills | Status: DC
Start: 2020-08-13 — End: 2021-08-03

## 2020-08-13 NOTE — Telephone Encounter (Signed)
Printed RX faxed to [facility] by Ellison Carwin, CMA.  Call to patient to notify of Rx. Left detailed message, ok per dpr. Advised of RX, f/u with Tibbie, return call to office if any additional questions.   Encounter closed.

## 2020-09-09 DIAGNOSIS — Z1231 Encounter for screening mammogram for malignant neoplasm of breast: Secondary | ICD-10-CM | POA: Diagnosis not present

## 2020-09-15 NOTE — Progress Notes (Signed)
Chief Complaint  Patient presents with  . Follow-up    4-6 mo/ asprin discussion/ wt gain     HPI: Terri Rodriguez 74 y.o. come in for Chronic disease management  Has had covid vaccine and booster Med  Pain  lyrica has helped   Even though arthritis is  On going  However gaining more weight  With medication    raynaud's  Controlled on meds  Sleep interrupted  But elevated bed head and feet has helped   Airway and swelling in legs  Hearing is a problem family reports also hard to understand   And masking  Makes worse  Has a plan to evaluate for  Aids  Depression same  On cymbalta that has helped  Spouse not  Trusting of her memory   Some  delayed word finding  ? Should she continue asa  No hx of cad per se  Spasm   No dva no current bleeding    ROS: See pertinent positives and negatives per HPI. No fall   Past Medical History:  Diagnosis Date  . ALLERGIC RHINITIS   . Anginal pain ([place])   . Blood transfusion without reported diagnosis   . Cataract    small right  . Cholelithiasis 06/09/2008   gallstones 2009 on ct scan  . Complication of anesthesia 2012   went to ICU after bilateral knees , unstable vital signs oct 2012, has surgery since did ok  . Coronary artery disease    30 % narrowing lad  . Diverticulitis   . EPICONDYLITIS, LATERAL    osteoarthritis, previous joint replacements  . Fall 10/2018  . GERD   . Hepatic hemangioma 06/09/08  . Hiatal hernia 03/2000  . Hx of adenomatous polyp of colon 06/05/08  . Hx of cardiac catheterization 2008    clean coronarys DrKelly   . Hx of chest pain 2003   neg cath remot hx of narrowwing lad in 2003 nl 2008, none in many years  . HYPERLIPIDEMIA   . IBS (irritable bowel syndrome)   . LEUKOPENIA, CHRONIC   . Mononucleosis 1963  . RAYNAUD'S SYNDROME, HX OF    improved after cardiac meds initiated  . Rosacea    facial  . Sleep apnea   . SLEEP APNEA, OBSTRUCTIVE    cpap, settings "automatic" settings 11-12  . Subacute  thyroiditis   . UNSPECIFIED ANEMIA     Family History  Problem Relation Age of Onset  . Rheum arthritis Mother   . Diabetes Mother   . Heart failure Mother   . Thrombocytopenia Mother   . Sleep apnea Mother   . Ulcers Mother        PUD  . Deep vein thrombosis Father   . Pulmonary embolism Father   . Osteoarthritis Father   . Atrial fibrillation Father   . Dementia Father   . Sleep apnea Father   . Sleep apnea Brother   . Obesity Brother   . Sleep apnea Brother   . Obesity Brother   . Colon cancer Maternal Aunt 43  . Colon cancer Maternal Aunt 90  . Pancreatic cancer Other   . Uterine cancer Other   . Leukemia Other   . Inflammatory bowel disease Other        aunt  . Congenital adrenal hyperplasia Grandchild   . Atrial fibrillation Brother   . Esophageal cancer Neg Hx   . Rectal cancer Neg Hx   . Stomach cancer Neg Hx  Social History   Socioeconomic History  . Marital status: Married    Spouse name: Not on file  . Number of children: 1  . Years of education: Not on file  . Highest education level: Not on file  Occupational History  . Occupation: Physician  Tobacco Use  . Smoking status: Never Smoker  . Smokeless tobacco: Never Used  Vaping Use  . Vaping Use: Never used  Substance and Sexual Activity  . Alcohol use: Yes    Alcohol/week: 14.0 standard drinks    Types: 14 Glasses of wine per week    Comment: wine 2 glasses per day  . Drug use: No  . Sexual activity: Yes    Partners: Male    Birth control/protection: Surgical    Comment: TAH/BSO  Other Topics Concern  . 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   Social Determinants of Health   Financial Resource Strain:   . Difficulty of Paying Living Expenses: Not on file  Food Insecurity:   . Worried About Charity fundraiser in the Last Year: Not on file  . Ran Out of Food in the Last  Year: Not on file  Transportation Needs:   . Lack of Transportation (Medical): Not on file  . Lack of Transportation (Non-Medical): Not on file  Physical Activity:   . Days of Exercise per Week: Not on file  . Minutes of Exercise per Session: Not on file  Stress:   . Feeling of Stress : Not on file  Social Connections:   . Frequency of Communication with Friends and Family: Not on file  . Frequency of Social Gatherings with Friends and Family: Not on file  . Attends Religious Services: Not on file  . Active Member of Clubs or Organizations: Not on file  . Attends Archivist Meetings: Not on file  . Marital Status: Not on file    Outpatient Medications Prior to Visit  Medication Sig Dispense Refill  . acetaminophen (TYLENOL) 500 MG tablet Take 1,000 mg by mouth every 6 (six) hours as needed for mild pain or headache.     Marland Kitchen amLODipine (NORVASC) 5 MG tablet TAKE 1 TABLET BY MOUTH EVERY DAY 90 tablet 3  . amoxicillin (AMOXIL) 500 MG capsule 2,000 mg. 1 hour prior to dental procedures.    Marland Kitchen aspirin 81 MG tablet Take 81 mg by mouth daily.    Marland Kitchen atorvastatin (LIPITOR) 20 MG tablet TAKE 1 TABLET BY MOUTH EVERY DAY AT 6PM 90 tablet 3  . Azelaic Acid (FINACEA) 15 % cream Apply 1 application topically 2 (two) times daily. After skin is thoroughly washed and patted dry, gently but thoroughly massage a thin film of azelaic acid cream into the affected area t daily,after shower.    . carvedilol (COREG) 3.125 MG tablet TAKE 1 TABLET BY MOUTH TWICE A DAY WITH MEALS 180 tablet 1  . celecoxib (CELEBREX) 200 MG capsule TAKE 1 CAPSULE BY MOUTH EVERY DAY 90 capsule 1  . Cholecalciferol (VITAMIN D3) 2000 UNITS TABS Take 1 tablet by mouth daily.    . clindamycin (CLEOCIN T) 1 % external solution 2 (two) times daily as needed.  3  . clobetasol ointment (TEMOVATE) 0.05 % Apply AA of skin BID prn  0  . diclofenac sodium (VOLTAREN) 1 % GEL Apply 2-4 g topically 4 (  four) times daily. As needed for   Arthritis  Pain. 4 Tube 1  . DULoxetine (CYMBALTA) 60 MG capsule TAKE 1 CAPSULE (60 MG TOTAL) BY MOUTH EVERY EVENING. 90 capsule 1  . estradiol (ESTRACE) 0.5 MG tablet 1/2 tab daily 45 tablet 4  . fexofenadine (ALLEGRA) 180 MG tablet Take 180 mg by mouth daily. Takes in PM    . fluocinonide (LIDEX) 0.05 % external solution Apply AA on scalp PRN  0  . ketoconazole (NIZORAL) 2 % cream SMARTSIG:1 Application Topical 1 to 2 Times Daily    . metroNIDAZOLE (METROGEL) 0.75 % gel APPLY TO AFFECTED AREA TWICE A DAY    . Multiple Vitamins-Minerals (CENTRUM ADULTS PO) Take 1 tablet by mouth daily.    . nitroGLYCERIN (NITROSTAT) 0.4 MG SL tablet PLACE 1 TABLET UNDER THE TONGUE EVERY 5 MINUTES AS NEEDED FOR CHEST PAIN 25 tablet 3  . NONFORMULARY OR COMPOUNDED ITEM Topical testosterone cream 1mg /0.44ml.  Apply topically 0.60ml topically two times weekly.  Disp: 3 month supply.  #0RF. 1 each 0  . omeprazole-sodium bicarbonate (ZEGERID) 40-1100 MG per capsule Take 1 capsule by mouth daily before breakfast.     . pregabalin (LYRICA) 50 MG capsule TAKE 1 CAPSULE BY MOUTH THREE TIMES A DAY 270 capsule 0  . ramipril (ALTACE) 10 MG capsule TAKE 1 CAPSULE BY MOUTH EVERY DAY 90 capsule 2  . valACYclovir (VALTREX) 1000 MG tablet TAKE 2 TABLET BY MOUTH Q12 hours X 2 DOSES WITH FEVER BLISTERS 30 tablet 1   No facility-administered medications prior to visit.     EXAM:  BP 118/68   Pulse 96   Temp 98.6 F (37 C) (Oral)   Ht 5\' 5"  (1.651 m)   Wt 246 lb 12.8 oz (111.9 kg)   SpO2 98%   BMI 41.07 kg/m   Body mass index is 41.07 kg/m.  GENERAL: vitals reviewed and listed above, alert, oriented, appears well hydrated and in no acute distress HEENT: atraumatic, conjunctiva  clear, no obvious abnormalities on inspection of external nose and ears OP : masked   Hard of  Hearing  But nl speech   NECK: no obvious masses on inspection palpation  LUNGS: clear to auscultation bilaterally, no wheezes, rales or rhonchi,  good air movement CV: HRRR, no clubbing cyanosis or  peripheral edema nl cap refill  Skin multiple cherry angiomas?trunk petechial like   No bruising  extremities are clear  MS: moves all extremities  oa changes no  Redness  Gait   Independent  PSYCH: pleasant and cooperative, memory not tested today   Lab Results  Component Value Date   WBC 4.6 09/05/2019   HGB 12.2 09/05/2019   HCT 37.0 09/05/2019   PLT 238 09/05/2019   GLUCOSE 97 04/17/2020   CHOL 155 06/17/2019   TRIG 100 06/17/2019   HDL 78 06/17/2019   LDLCALC 57 06/17/2019   ALT 26 04/17/2020   AST 37 04/17/2020   NA 141 04/17/2020   K 4.5 04/17/2020   CL 105 04/17/2020   CREATININE 0.61 04/17/2020   BUN 22 04/17/2020   CO2 30 04/17/2020   TSH 1.840 06/17/2019   INR 0.97 07/10/2014   HGBA1C 5.4 03/16/2020   BP Readings from Last 3 Encounters:  09/16/20 118/68  08/07/20 114/64  03/16/20 114/64   Wt Readings from Last 3 Encounters:  09/16/20 246 lb 12.8 oz (111.9 kg)  08/07/20 245 lb (111.1 kg)  03/16/20 236 lb 9.6 oz (107.3 kg)  ASSESSMENT AND PLAN:  Discussed the following assessment and plan:  Weight gain - Plan: Lipid panel, Hepatic function panel, CBC with Differential/Platelet, BASIC METABOLIC PANEL WITH GFR, Hemoglobin A1c  Medication management - Plan: Lipid panel, Hepatic function panel, CBC with Differential/Platelet, BASIC METABOLIC PANEL WITH GFR, Hemoglobin A1c  Hyperglycemia - Plan: Lipid panel, Hepatic function panel, CBC with Differential/Platelet, BASIC METABOLIC PANEL WITH GFR, Hemoglobin A1c  Raynaud's disease without gangrene  Hyperlipidemia LDL goal <70 - Plan: Lipid panel, Hepatic function panel, CBC with Differential/Platelet, BASIC METABOLIC PANEL WITH GFR, Hemoglobin A1c  Need for immunization against influenza - Plan: Flu Vaccine QUAD High Dose(Fluad)  Obstructive sleep apnea - Plan: Lipid panel, Hepatic function panel, CBC with Differential/Platelet, BASIC METABOLIC PANEL WITH  GFR, Hemoglobin A1c  Morbid obesity (HCC) - Plan: Lipid panel, Hepatic function panel, CBC with Differential/Platelet, BASIC METABOLIC PANEL WITH GFR, Hemoglobin A1c  Primary osteoarthritis involving multiple joints - Plan: Lipid panel, Hepatic function panel, CBC with Differential/Platelet, BASIC METABOLIC PANEL WITH GFR, Hemoglobin A1c  Decreased hearing, unspecified laterality Consider  Rybelsus.  Let me know if you want to try in addition to coaching  Apps.   Fasting lab in about a month  Or so   .  Agree with hearing evaluation. Ask Dr Claiborne Billings about the continuation of ASA   -Patient advised to return or notify health care team  if  new concerns arise.  Patient Instructions  Consider  Rybelsus.  Let me know if you want to try in addition to coaching  Apps.    Fasting lab in about a month  Or so   .  Agree with hearing evaluation. As Dr Claiborne Billings about the continuation of ASA  . Plan ROV  Depending on   Medication   Or 4 months.    Standley Brooking. Alena Blankenbeckler M.D.

## 2020-09-16 ENCOUNTER — Ambulatory Visit (INDEPENDENT_AMBULATORY_CARE_PROVIDER_SITE_OTHER): Payer: Medicare Other | Admitting: Internal Medicine

## 2020-09-16 ENCOUNTER — Encounter: Payer: Self-pay | Admitting: Internal Medicine

## 2020-09-16 ENCOUNTER — Other Ambulatory Visit: Payer: Self-pay

## 2020-09-16 VITALS — BP 118/68 | HR 96 | Temp 98.6°F | Ht 65.0 in | Wt 246.8 lb

## 2020-09-16 DIAGNOSIS — Z79899 Other long term (current) drug therapy: Secondary | ICD-10-CM

## 2020-09-16 DIAGNOSIS — I7300 Raynaud's syndrome without gangrene: Secondary | ICD-10-CM | POA: Diagnosis not present

## 2020-09-16 DIAGNOSIS — E785 Hyperlipidemia, unspecified: Secondary | ICD-10-CM

## 2020-09-16 DIAGNOSIS — R739 Hyperglycemia, unspecified: Secondary | ICD-10-CM

## 2020-09-16 DIAGNOSIS — R635 Abnormal weight gain: Secondary | ICD-10-CM

## 2020-09-16 DIAGNOSIS — G4733 Obstructive sleep apnea (adult) (pediatric): Secondary | ICD-10-CM

## 2020-09-16 DIAGNOSIS — M8949 Other hypertrophic osteoarthropathy, multiple sites: Secondary | ICD-10-CM

## 2020-09-16 DIAGNOSIS — H9190 Unspecified hearing loss, unspecified ear: Secondary | ICD-10-CM

## 2020-09-16 DIAGNOSIS — Z23 Encounter for immunization: Secondary | ICD-10-CM

## 2020-09-16 DIAGNOSIS — M159 Polyosteoarthritis, unspecified: Secondary | ICD-10-CM

## 2020-09-16 NOTE — Patient Instructions (Addendum)
Consider  Rybelsus.  Let me know if you want to try in addition to coaching  Apps.    Fasting lab in about a month  Or so   .  Agree with hearing evaluation. As Dr Claiborne Billings about the continuation of ASA  . Plan ROV  Depending on   Medication   Or 4 months.

## 2020-09-23 ENCOUNTER — Ambulatory Visit: Payer: Medicare Other | Admitting: Internal Medicine

## 2020-09-23 ENCOUNTER — Encounter: Payer: Self-pay | Admitting: Internal Medicine

## 2020-09-23 ENCOUNTER — Other Ambulatory Visit: Payer: Self-pay

## 2020-09-23 DIAGNOSIS — G4733 Obstructive sleep apnea (adult) (pediatric): Secondary | ICD-10-CM | POA: Diagnosis not present

## 2020-09-23 NOTE — Progress Notes (Signed)
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 After recent minor surgical procedure done while on her back, nurse said "you really do need CPAP". Otherwise breathing well- denies heart/ lung problems. Had flu vax Her PCP office asked [place] to get a BMET at this visit for issue of hyponatremia to save her a trip back to that office.   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 HEENT:   No-  headaches, difficulty swallowing, tooth/dental problems, sore throat,       No-  sneezing, itching, ear ache, nasal congestion, post nasal drip,  CV:  No-   chest pain, orthopnea, PND, swelling in lower extremities, anasarca,                                                 dizziness, palpitations Resp: No-   shortness of breath with exertion or at rest.              No-   productive cough,  No non-productive cough,  No- coughing up of blood.              No-   change in color of mucus.  No- wheezing.   Skin: No-   rash or lesions. GI:  No-   heartburn, indigestion, abdominal pain, nausea, vomiting,  GU:   MS:  +joint pain or swelling.  Neuro-     nothing unusual Psych:  No- change in mood or affect. No depression or anxiety.  No memory loss.  OBJ- Physical Exam General- Alert, Oriented, Affect-appropriate, Distress- none acute, +obese Skin- rash-none, lesions- none, excoriation- none Lymphadenopathy- none Head- atraumatic            Eyes- Gross vision intact, PERRLA, conjunctivae and secretions clear            Ears- Hearing, canals-normal            Nose- Clear, no-Septal dev, mucus, polyps, erosion, perforation             Throat- Mallampati III-IV , mucosa clear , drainage- none, tonsils- atrophic Neck- flexible , trachea midline, no stridor , thyroid nl, carotid no bruit Chest - symmetrical excursion , unlabored           Heart/CV- RRR , no murmur , no gallop  , no rub, nl s1 s2                           - JVD- none , edema- none, stasis changes- none, varices- none  Lung- clear to P&A, wheeze- none, cough- none , dullness-none, rub- none           Chest wall-  Abd-  Br/ Gen/ Rectal- Not done, not indicated Extrem- cyanosis- none, clubbing, none, atrophy- none, strength- nl Neuro- grossly intact to observation

## 2020-09-23 NOTE — Patient Instructions (Signed)
We can continue CPAP auto 4-18  Please call if we can help make life better

## 2020-09-28 NOTE — Assessment & Plan Note (Signed)
Benefits with good compliance and control Plan- continue auto 4-18

## 2020-09-28 NOTE — Assessment & Plan Note (Signed)
She understands value to her of setting weight loss goal

## 2020-10-15 ENCOUNTER — Other Ambulatory Visit: Payer: Medicare Other

## 2020-10-15 ENCOUNTER — Other Ambulatory Visit: Payer: Self-pay

## 2020-10-15 DIAGNOSIS — Z79899 Other long term (current) drug therapy: Secondary | ICD-10-CM

## 2020-10-15 DIAGNOSIS — R739 Hyperglycemia, unspecified: Secondary | ICD-10-CM

## 2020-10-15 DIAGNOSIS — E785 Hyperlipidemia, unspecified: Secondary | ICD-10-CM

## 2020-10-15 DIAGNOSIS — R635 Abnormal weight gain: Secondary | ICD-10-CM | POA: Diagnosis not present

## 2020-10-15 DIAGNOSIS — G4733 Obstructive sleep apnea (adult) (pediatric): Secondary | ICD-10-CM

## 2020-10-15 DIAGNOSIS — M159 Polyosteoarthritis, unspecified: Secondary | ICD-10-CM

## 2020-10-15 DIAGNOSIS — M8949 Other hypertrophic osteoarthropathy, multiple sites: Secondary | ICD-10-CM

## 2020-10-16 LAB — LIPID PANEL
Cholesterol: 157 mg/dL (ref <200)
HDL: 82 mg/dL (ref >=50)
LDL Cholesterol (Calc): 57 mg/dL (calc)
Non-HDL Cholesterol (Calc): 75 mg/dL (calc) (ref <130)
Total CHOL/HDL Ratio: 1.9 (calc) (ref <5.0)
Triglycerides: 97 mg/dL (ref <150)

## 2020-10-16 LAB — CBC WITH DIFFERENTIAL/PLATELET
Absolute Monocytes: 267 cells/uL (ref 200–950)
Basophils Absolute: 30 cells/uL (ref 0–200)
Basophils Relative: 0.9 %
Eosinophils Absolute: 158 cells/uL (ref 15–500)
Eosinophils Relative: 4.8 %
HCT: 36.6 % (ref 35.0–45.0)
Hemoglobin: 12.2 g/dL (ref 11.7–15.5)
Lymphs Abs: 1346 cells/uL (ref 850–3900)
MCH: 32.5 pg (ref 27.0–33.0)
MCHC: 33.3 g/dL (ref 32.0–36.0)
MCV: 97.6 fL (ref 80.0–100.0)
MPV: 9.6 fL (ref 7.5–12.5)
Monocytes Relative: 8.1 %
Neutro Abs: 1498 cells/uL — ABNORMAL LOW (ref 1500–7800)
Neutrophils Relative %: 45.4 %
Platelets: 199 10*3/uL (ref 140–400)
RBC: 3.75 10*6/uL — ABNORMAL LOW (ref 3.80–5.10)
RDW: 13.3 % (ref 11.0–15.0)
Total Lymphocyte: 40.8 %
WBC: 3.3 10*3/uL — ABNORMAL LOW (ref 3.8–10.8)

## 2020-10-16 LAB — HEPATIC FUNCTION PANEL
AG Ratio: 2.4 (calc) (ref 1.0–2.5)
ALT: 31 U/L — ABNORMAL HIGH (ref 6–29)
AST: 47 U/L — ABNORMAL HIGH (ref 10–35)
Albumin: 4.4 g/dL (ref 3.6–5.1)
Alkaline phosphatase (APISO): 78 U/L (ref 37–153)
Bilirubin, Direct: 0.1 mg/dL (ref 0.0–0.2)
Globulin: 1.8 g/dL (calc) — ABNORMAL LOW (ref 1.9–3.7)
Indirect Bilirubin: 0.4 mg/dL (calc) (ref 0.2–1.2)
Total Bilirubin: 0.5 mg/dL (ref 0.2–1.2)
Total Protein: 6.2 g/dL (ref 6.1–8.1)

## 2020-10-16 LAB — HEMOGLOBIN A1C
Hgb A1c MFr Bld: 5.2 % of total Hgb (ref <5.7)
Mean Plasma Glucose: 103 (calc)
eAG (mmol/L): 5.7 (calc)

## 2020-10-16 LAB — BASIC METABOLIC PANEL WITH GFR
BUN: 19 mg/dL (ref 7–25)
CO2: 27 mmol/L (ref 20–32)
Calcium: 9.3 mg/dL (ref 8.6–10.4)
Chloride: 105 mmol/L (ref 98–110)
Creat: 0.7 mg/dL (ref 0.60–0.93)
GFR, Est African American: 98 mL/min/{1.73_m2} (ref >=60)
GFR, Est Non African American: 85 mL/min/{1.73_m2} (ref >=60)
Glucose, Bld: 108 mg/dL — ABNORMAL HIGH (ref 65–99)
Potassium: 4.4 mmol/L (ref 3.5–5.3)
Sodium: 141 mmol/L (ref 135–146)

## 2020-10-19 ENCOUNTER — Telehealth: Payer: Self-pay | Admitting: Internal Medicine

## 2020-10-19 ENCOUNTER — Other Ambulatory Visit: Payer: Self-pay | Admitting: Internal Medicine

## 2020-10-19 ENCOUNTER — Encounter: Payer: Self-pay | Admitting: Obstetrics & Gynecology

## 2020-10-19 NOTE — Progress Notes (Signed)
Labs stable except  lfts elevated mildly again .   Repeat  lft and bmp before  in  February . If continues  we may get an abdominal US    as I dont see any recent imaging

## 2020-10-19 NOTE — Telephone Encounter (Signed)
Left message for patient to call back and schedule Medicare Annual Wellness Visit (AWV) either virtually or in office.  Last AWV 02/01/17; please schedule at anytime with [facility] Nurse Health Advisor 2.  This should be a 45 minute visit.

## 2020-10-20 NOTE — Telephone Encounter (Signed)
Last filled 07/16/20 Last OV 09/16/20 Next OV 01/15/21

## 2020-10-21 ENCOUNTER — Ambulatory Visit: Payer: Medicare Other | Admitting: Cardiovascular Disease

## 2020-10-21 ENCOUNTER — Other Ambulatory Visit: Payer: Self-pay

## 2020-10-21 ENCOUNTER — Encounter: Payer: Self-pay | Admitting: Cardiovascular Disease

## 2020-10-21 VITALS — BP 122/60 | HR 72 | Ht 66.25 in | Wt 246.8 lb

## 2020-10-21 DIAGNOSIS — I2510 Atherosclerotic heart disease of native coronary artery without angina pectoris: Secondary | ICD-10-CM

## 2020-10-21 DIAGNOSIS — I7300 Raynaud's syndrome without gangrene: Secondary | ICD-10-CM | POA: Diagnosis not present

## 2020-10-21 DIAGNOSIS — E785 Hyperlipidemia, unspecified: Secondary | ICD-10-CM

## 2020-10-21 DIAGNOSIS — G4733 Obstructive sleep apnea (adult) (pediatric): Secondary | ICD-10-CM | POA: Diagnosis not present

## 2020-10-21 DIAGNOSIS — I10 Essential (primary) hypertension: Secondary | ICD-10-CM | POA: Diagnosis not present

## 2020-10-21 NOTE — Progress Notes (Signed)
Patient ID: Terri Rodriguez, female   DOB: 04/26/1946, 74 y.o.   MRN: [id number]    Primary M.D.: Dr. Shanon Ace  HPI: Terri Rodriguez is a 74 y.o. female who presents to the office today for 1 year follow-up cardiology evaluation.  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 Cardiac catheterization demonstrated a 30% tubular narrowing of the proximal LAD. She has a history of probable Raynaud's with a remote history of digital ischemia/spasm. She has been aggressively treated with medical therapy both for potential coronary vasospasm as well as lipid-lowering therapy for treatment methods to optimize endothelial function and blood pressure. She does have significant arthritic issues with degenerative disc disease and has undergone surgery involving L3-L4, L4-L5 by Dr. Sherwood Gambler. She also is status post bilateral knee surgery as well as left hip replacement. She has a history of obstructive sleep apnea on CPAP therapy, history of osteoarthritis which has been progressive. There also is a history of rosacea.    She sustained a left radius fracture and required open reduction and internal fixation in April 2015.  In August 2015, she underwent right hip replacement.  Her arthritis has limited her activity.  She has a history of obstructive sleep apnea and admits to 100% CPAP use.  She is unaware of breakthrough snoring.  She denies excessive daytime sleepiness.  When I saw her in April 2019 she denied any recurrent episodes of chest tightness or pressure. She denied any Raynaud's phenomenon. She was unaware of any palpitations.  She continues to have neuropathy in nerve pain issues for which she is on Cymbalta and recently was started on pregabalin with improvement.  There is no shortness of breath, chest pain, or digital vasospasm on amlodipine 5 mg, ramipril 10 mg, and carvedilol 3.125 mg twice a day.  She is on atorvastatin 20 mg. Lipid studies in   2018 showed a total cholesterol 165, HDL 76, LDLs 73, which had risen from 59 one year previously, and triglycerides are 108.    I saw her in July 2019 at which time she was remaining cardiac stable.  She is a grandmother of 2 girls who have congenital adrenal hypoplasia are doing well.  She continued to have arthritic issues with both hips and knees.  He was not successful with weight loss she has not been successful with weight loss.  She uses CPAP with 100% compliance with her DME company being [employer].   She was last seen October 2020.  At that time she denied any chest pain or shortness of breath.  She denies any Raynaud's symptoms.  She has been stable on a medical regimen to reduce potential for vasospasm as well as improve endothelial function including amlodipine 5 mg, ramipril 10 mg and carvedilol 3.125 mg.  Her blood pressure has been stable.  She is continue to be on atorvastatin 20 mg for hyperlipidemia.  She continues to have significant pain from her chronic arthritic issues and was on Cymbalta for nerve pain.  Recently she has been bothered by arthritis in her neck.  She has seen Dr. Sherwood Gambler.  He does have issues with both knees, both hips, and DJD of her back.  She had 100% compliance with CPAP.    Over the past year, she has continued to do well.  She continues to have issues with arthritic pain leading to neuropathy for which she takes gabapentin.  She had seen Dr. Sherwood Gambler earlier this  year but he since has retired.  She denies any chest pain or shortness of breath.  She continues to be on atorvastatin 20 mg for hyperlipidemia.  On medical therapy she has not had any Raynaud's symptoms.  She has been on amlodipine 5 mg, ramipril 10 mg and carvedilol for hypertension and improvement in function..  She recently saw Dr. Regis Bill who checked laboratory on October 15, 2020.  LDL cholesterol was excellent at 57.  She had minimal AST elevation at 47 and ALT at 31.  Dr. Regis Bill will be  rechecking laboratory in February and discussed with her the possibility of ultrasound if they remain elevated.  Past Medical History:  Diagnosis Date  . ALLERGIC RHINITIS   . Anginal pain ([place])   . Blood transfusion without reported diagnosis   . Cataract    small right  . Cholelithiasis 06/09/2008   gallstones 2009 on ct scan  . Complication of anesthesia 2012   went to ICU after bilateral knees , unstable vital signs oct 2012, has surgery since did ok  . Coronary artery disease    30 % narrowing lad  . Diverticulitis   . EPICONDYLITIS, LATERAL    osteoarthritis, previous joint replacements  . Fall 10/2018  . GERD   . Hepatic hemangioma 06/09/08  . Hiatal hernia 03/2000  . Hx of adenomatous polyp of colon 06/05/08  . Hx of cardiac catheterization 2008    clean coronarys DrKelly   . Hx of chest pain 2003   neg cath remot hx of narrowwing lad in 2003 nl 2008, none in many years  . HYPERLIPIDEMIA   . IBS (irritable bowel syndrome)   . LEUKOPENIA, CHRONIC   . Mononucleosis 1963  . RAYNAUD'S SYNDROME, HX OF    improved after cardiac meds initiated  . Rosacea    facial  . Sleep apnea   . SLEEP APNEA, OBSTRUCTIVE    cpap, settings "automatic" settings 11-12  . Subacute thyroiditis   . UNSPECIFIED ANEMIA     Past Surgical History:  Procedure Laterality Date  . ABDOMINAL HYSTERECTOMY  1993   bso  . BACK SURGERY  2008   l 3 to l4 l4 to l5  . BARTHOLIN GLAND CYST EXCISION Right 09/09/2019   Procedure: EXCISION OF VULVAR MASS TIMES TWO;  Surgeon: Megan Salon, MD;  Location: [facility];  Service: Gynecology;  Laterality: Right;  . CARDIAC CATHETERIZATION  04/07/2007   Noncritical coronary artery disease. Contiue medical therapy.  Marland Kitchen CARDIAC CATHETERIZATION  12/03/2007   Normal LV function. Mild angiographic mitral valve prolapse. Normal coronary arteries.  Marland Kitchen CARDIOVASCULAR STRESS TEST  11/09/2007   Moderate ischemia in Mid Anterior, Mid Anteroseptal, Apical  Anterior, and Apical Septal regions. EKG negative for ischemia.  Marland Kitchen [place]  . dental implants  11/2002,11/2011,11/2012  . dental implants     x 6  . DENTAL SURGERY    . DILATION AND CURETTAGE OF UTERUS     d and e 1988 and 1990  . JOINT REPLACEMENT  09/21/2011   bilateral knee  . KNEE ARTHROSCOPY     bilateral  . lumbar surgery fixation with disc replacement  10/08   Left  . NASAL SEPTOPLASTY W/ TURBINOPLASTY  05/2000  . NOCTURNAL POLYSOMNOGRAM  12/31/2006   Severe obstructive sleep apnea. AHI-87/hr  . REPLACEMENT TOTAL KNEE  2012   bilateral  . TONSILLECTOMY  1952   adenoids too  . TOTAL HIP ARTHROPLASTY Left 10/21/2013   Procedure:  LEFT TOTAL HIP ARTHROPLASTY ANTERIOR APPROACH;  Surgeon: [name], MD;  Location: [facility];  Service: Orthopedics;  Laterality: Left;  . TOTAL HIP ARTHROPLASTY Right 07/16/2014   Procedure: RIGHT TOTAL HIP ARTHROPLASTY ANTERIOR APPROACH;  Surgeon: [name], MD;  Location: [facility];  Service: Orthopedics;  Laterality: Right;  . TRANSTHORACIC ECHOCARDIOGRAM  11/09/2007   EF 69%, LV systolic function normal. Mild aortic root dilation.  . WRIST SURGERY Left 04/03/14   ORIF "distal radial head fracture"    Allergies  Allergen Reactions  . Codeine     nausea  . Crab [Shellfish Allergy] Itching and Nausea And Vomiting    Current Outpatient Medications  Medication Sig Dispense Refill  . acetaminophen (TYLENOL) 500 MG tablet Take 1,000 mg by mouth every 6 (six) hours as needed for mild pain or headache.     Marland Kitchen amLODipine (NORVASC) 5 MG tablet TAKE 1 TABLET BY MOUTH EVERY DAY 90 tablet 3  . amoxicillin (AMOXIL) 500 MG capsule 2,000 mg. 1 hour prior to dental procedures.    Marland Kitchen aspirin 81 MG tablet Take 81 mg by mouth daily.    Marland Kitchen atorvastatin (LIPITOR) 20 MG tablet TAKE 1 TABLET BY MOUTH EVERY DAY AT 6PM 90 tablet 3  . Azelaic Acid (FINACEA) 15 % cream Apply 1 application topically 2 (two) times daily. After skin is thoroughly  washed and patted dry, gently but thoroughly massage a thin film of azelaic acid cream into the affected area t daily,after shower.    . carvedilol (COREG) 3.125 MG tablet TAKE 1 TABLET BY MOUTH TWICE A DAY WITH MEALS 180 tablet 1  . celecoxib (CELEBREX) 200 MG capsule TAKE 1 CAPSULE BY MOUTH EVERY DAY 90 capsule 1  . Cholecalciferol (VITAMIN D3) 2000 UNITS TABS Take 1 tablet by mouth daily.    . clindamycin (CLEOCIN T) 1 % external solution 2 (two) times daily as needed.  3  . diclofenac sodium (VOLTAREN) 1 % GEL Apply 2-4 g topically 4 (four) times daily. As needed for  Arthritis  Pain. 4 Tube 1  . DULoxetine (CYMBALTA) 60 MG capsule TAKE 1 CAPSULE (60 MG TOTAL) BY MOUTH EVERY EVENING. 90 capsule 1  . estradiol (ESTRACE) 0.5 MG tablet 1/2 tab daily 45 tablet 4  . fexofenadine (ALLEGRA) 180 MG tablet Take 180 mg by mouth daily. Takes in PM    . fluocinonide (LIDEX) 0.05 % external solution Apply AA on scalp PRN  0  . ketoconazole (NIZORAL) 2 % cream SMARTSIG:1 Application Topical 1 to 2 Times Daily    . metroNIDAZOLE (METROGEL) 0.75 % gel APPLY TO AFFECTED AREA TWICE A DAY    . Multiple Vitamins-Minerals (CENTRUM ADULTS PO) Take 1 tablet by mouth daily.    . nitroGLYCERIN (NITROSTAT) 0.4 MG SL tablet PLACE 1 TABLET UNDER THE TONGUE EVERY 5 MINUTES AS NEEDED FOR CHEST PAIN 25 tablet 3  . NONFORMULARY OR COMPOUNDED ITEM Topical testosterone cream 27m/0.10ml.  Apply topically 0.129mtopically two times weekly.  Disp: 3 month supply.  #0RF. 1 each 0  . omeprazole (PRILOSEC) 20 MG capsule Take 20 mg by mouth in the morning and at bedtime.    . pregabalin (LYRICA) 50 MG capsule TAKE 1 CAPSULE BY MOUTH THREE TIMES A DAY 270 capsule 1  . ramipril (ALTACE) 10 MG capsule TAKE 1 CAPSULE BY MOUTH EVERY DAY 90 capsule 2  . valACYclovir (VALTREX) 1000 MG tablet TAKE 2 TABLET BY MOUTH Q12 hours X 2 DOSES WITH FEVER BLISTERS 30 tablet  1   No current facility-administered medications for this visit.    Social  History   Socioeconomic History  . Marital status: Married    Spouse name: Not on file  . Number of children: 1  . Years of education: Not on file  . Highest education level: Not on file  Occupational History  . Occupation: Physician  Tobacco Use  . Smoking status: Never Smoker  . Smokeless tobacco: Never Used  Vaping Use  . Vaping Use: Never used  Substance and Sexual Activity  . Alcohol use: Yes    Alcohol/week: 14.0 standard drinks    Types: 14 Glasses of wine per week    Comment: wine 2 glasses per day  . Drug use: No  . Sexual activity: Yes    Partners: Male    Birth control/protection: Surgical    Comment: TAH/BSO  Other Topics Concern  . 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   Social Determinants of Health   Financial Resource Strain:   . Difficulty of Paying Living Expenses: Not on file  Food Insecurity:   . Worried About Charity fundraiser in the Last Year: Not on file  . Ran Out of Food in the Last Year: Not on file  Transportation Needs:   . Lack of Transportation (Medical): Not on file  . Lack of Transportation (Non-Medical): Not on file  Physical Activity:   . Days of Exercise per Week: Not on file  . Minutes of Exercise per Session: Not on file  Stress:   . Feeling of Stress : Not on file  Social Connections:   . Frequency of Communication with Friends and Family: Not on file  . Frequency of Social Gatherings with Friends and Family: Not on file  . Attends Religious Services: Not on file  . Active Member of Clubs or Organizations: Not on file  . Attends Archivist Meetings: Not on file  . Marital Status: Not on file  Intimate Partner Violence:   . Fear of Current or Ex-Partner: Not on file  . Emotionally Abused: Not on file  . Physically Abused: Not on file  . Sexually Abused: Not on file    Family History  Problem  Relation Age of Onset  . Rheum arthritis Mother   . Diabetes Mother   . Heart failure Mother   . Thrombocytopenia Mother   . Sleep apnea Mother   . Ulcers Mother        PUD  . Deep vein thrombosis Father   . Pulmonary embolism Father   . Osteoarthritis Father   . Atrial fibrillation Father   . Dementia Father   . Sleep apnea Father   . Sleep apnea Brother   . Obesity Brother   . Sleep apnea Brother   . Obesity Brother   . Colon cancer Maternal Aunt 28  . Colon cancer Maternal Aunt 90  . Pancreatic cancer Other   . Uterine cancer Other   . Leukemia Other   . Inflammatory bowel disease Other        aunt  . Congenital adrenal hyperplasia Grandchild   . Atrial fibrillation Brother   . Esophageal cancer Neg Hx   . Rectal cancer Neg Hx   . Stomach cancer Neg Hx     ROS General: Negative; No fevers, chills, or night  sweats; positive for weight gain  HEENT: Negative; No changes in vision or hearing, sinus congestion, difficulty swallowing Pulmonary: Negative; No cough, wheezing, shortness of breath, hemoptysis Cardiovascular: Negative; No chest pain, presyncope, syncope, palpitations GI: Positive for GERD No nausea, vomiting, diarrhea, or abdominal pain GU: Negative; No dysuria, hematuria, or difficulty voiding Musculoskeletal: Positive for lumbar disc surgery, bilateral knee replacements and bilateral hip replacements; now with neck discomfort from cervical disc disease Hematologic/Oncology: Negative; no easy bruising, bleeding Endocrine: Negative; no heat/cold intolerance; no diabetes Neuro: Negative; no changes in balance, headaches Skin: Negative; No rashes or skin lesions Psychiatric: Negative; No behavioral problems, depression Sleep: Positive for obstructive sleep apnea on CPAP therapy; No snoring, daytime sleepiness, hypersomnolence, bruxism, restless legs, hypnogognic hallucinations, no cataplexy Other comprehensive 14 point system review is negative.   PE BP 122/60    Pulse 72   Ht 5' 6.25" (1.683 m)   Wt 246 lb 12.8 oz (111.9 kg)   BMI 39.53 kg/m    Repeat blood pressure by me was 118/64  Wt Readings from Last 3 Encounters:  10/21/20 246 lb 12.8 oz (111.9 kg)  09/23/20 246 lb (111.6 kg)  09/16/20 246 lb 12.8 oz (111.9 kg)   General: Alert, oriented, no distress.  Skin: normal turgor, no rashes, warm and dry HEENT: Normocephalic, atraumatic. Pupils equal round and reactive to light; sclera anicteric; extraocular muscles intact;  Nose without nasal septal hypertrophy Mouth/Parynx benign; Mallinpatti scale 3 Neck: No JVD, no carotid bruits; normal carotid upstroke Lungs: clear to ausculatation and percussion; no wheezing or rales Chest wall: without tenderness to palpitation Heart: PMI not displaced, RRR, s1 s2 normal, 1/6 systolic murmur, no diastolic murmur, no rubs, gallops, thrills, or heaves Abdomen: soft, nontender; no hepatosplenomehaly, BS+; abdominal aorta nontender and not dilated by palpation. Back: no CVA tenderness Pulses 2+ Musculoskeletal: full range of motion, normal strength, no joint deformities Extremities: Osteoarthritis with significant Heberden's nodes; no clubbing cyanosis or edema, Homan's sign negative  Neurologic: grossly nonfocal; Cranial nerves grossly wnl Psychologic: Normal mood and affect   ECG (independently read by me): Normal sinus rhythm at 72 bpm, incomplete right bundle branch block.  No ectopy.  Normal intervals.  October 2020 ECG (independently read by me): Normal sinus rhythm at 79 bpm.  Incomplete right bundle branch block.  No ectopy.  July 2019 ECG (independently read by me): Normal sinus rhythm at 70 bpm.  T wave abnormality, nonspecific V1 V2.  Normal intervals.  No ectopy.  April 2018 ECG (independently read by me): Normal sinus rhythm at 65 bpm.  Nonspecific T waves.  Normal intervals.  March 2017 ECG (independently read by me): Normal sinus rhythm at 81 bpm isolate a PVC.  Nonspecific T-wave  changes with previously noted T-wave inversion in V1, V2.  QTc interval 487 ms.  February 2016 ECG (independently read by me): Normal sinus rhythm at 81 bpm.  Previously noted T-wave inversion V1 and V2 which is old and has been documented for over 10 years.  Mild QTC increased at 469 ms  February 2015 ECG (independently read by me): Sinus rhythm at 76 beats per minute period. T wave inversion in V1 V2 which is old.  LABS: BMP Latest Ref Rng & Units 10/15/2020 04/17/2020 03/16/2020  Glucose 65 - 99 mg/dL 108(H) 97 94  BUN 7 - 25 mg/dL 19 22 26(H)  Creatinine 0.60 - 0.93 mg/dL 0.70 0.61 0.86  BUN/Creat Ratio 6 - 22 (calc) NOT APPLICABLE - -  Sodium 809 - 146  mmol/L 141 141 138  Potassium 3.5 - 5.3 mmol/L 4.4 4.5 5.2(H)  Chloride 98 - 110 mmol/L 105 105 104  CO2 20 - 32 mmol/L 27 30 28   Calcium 8.6 - 10.4 mg/dL 9.3 8.8 9.3   Hepatic Function Latest Ref Rng & Units 10/15/2020 04/17/2020 03/16/2020  Total Protein 6.1 - 8.1 g/dL 6.2 6.1 6.5  Albumin 3.5 - 5.2 g/dL - 4.2 4.5  AST 10 - 35 U/L 47(H) 37 52(H)  ALT 6 - 29 U/L 31(H) 26 49(H)  Alk Phosphatase 39 - 117 U/L - 72 85  Total Bilirubin 0.2 - 1.2 mg/dL 0.5 0.5 0.5  Bilirubin, Direct 0.0 - 0.2 mg/dL 0.1 0.1 0.1    CBC Latest Ref Rng & Units 10/15/2020 09/05/2019 06/17/2019  WBC 3.8 - 10.8 Thousand/uL 3.3(L) 4.6 4.3  Hemoglobin 11.7 - 15.5 g/dL 12.2 12.2 13.0  Hematocrit 35 - 45 % 36.6 37.0 39.0  Platelets 140 - 400 Thousand/uL 199 238 209    Lab Results  Component Value Date   MCV 97.6 10/15/2020   MCV 99.2 09/05/2019   MCV 102 (H) 06/17/2019    Lab Results  Component Value Date   TSH 1.840 06/17/2019   Lab Results  Component Value Date   HGBA1C 5.2 10/15/2020   Lipid Panel     Component Value Date/Time   CHOL 157 10/15/2020 0922   CHOL 155 06/17/2019 1109   TRIG 97 10/15/2020 0922   HDL 82 10/15/2020 0922   HDL 78 06/17/2019 1109   CHOLHDL 1.9 10/15/2020 0922   VLDL 15.0 02/05/2018 1013   LDLCALC 57 10/15/2020 0922      RADIOLOGY: No results found.  IMPRESSION:  1. Essential hypertension   2. Hyperlipidemia LDL goal <70   3. OSA (obstructive sleep apnea)   4. Raynaud's phenomenon without gangrene   5. 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 She underwent repeat cardiac catheterization in 2008 prior to back surgery after nuclear perfusion study raise the possibility of ischemia.  Coronary angiography showed normalization of coronary arteries without significant obstruction.  She has continued to well without recurrent anginal symptomatology.  She has a history of digital ischemia digestive of Raynaud's symptomatology but this has completely stabilized with her medical regimen now consisting of amlodipine 5 mg, ramipril 10 mg, carvedilol 3.125 mg.  She initially was started on this regimen and attempt to reduce vasospasm as well as improve endothelial function.  Over the past year she continues to be stable.  She denies any chest pain or shortness of breath.  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  At times if she keeps her legs dependent she does note some mild ankle edema but as long as she does elevate her legs portion of the day there is no edema.  She is tolerating atorvastatin 20 mg for hyperlipidemia.  Most recent LDL cholesterol is excellent at 57.  She had minimal AST and ALT elevation.  She does drink wine on a daily basis.  She continues to have issues with arthritis of her knees hips and back with compression on her nerves for which she takes pregabalin and she continues to take duloxetine.  Dr. Sherwood Gambler has retired and she will need in the future to reestablish with a neurosurgeon.  I gave her several names in the same  group.  She continues to use CPAP.  As long as she is stable I will see her in 1 year for reevaluation   Terri Sine, MD, [facility]  10/21/2020 5:30 PM

## 2020-10-21 NOTE — Patient Instructions (Signed)
Medication Instructions:  Continue current medications  *If you need a refill on your cardiac medications before your next appointment, please call your pharmacy*   Lab Work: None Ordered   Testing/Procedures: None Ordered   Follow-Up: At [facility], you and your health needs are our priority.  As part of our continuing mission to provide you with exceptional heart care, we have created designated Provider Care Teams.  These Care Teams include your primary Cardiologist (physician) and Advanced Practice Providers (APPs -  Physician Assistants and Nurse Practitioners) who all work together to provide you with the care you need, when you need it.  We recommend signing up for the patient portal called "MyChart".  Sign up information is provided on this After Visit Summary.  MyChart is used to connect with patients for Virtual Visits (Telemedicine).  Patients are able to view lab/test results, encounter notes, upcoming appointments, etc.  Non-urgent messages can be sent to your provider as well.   To learn more about what you can do with MyChart, go to [email or web address].    Your next appointment:   1 year(s)  The format for your next appointment:   In Person  Provider:   You may see Jaelen Soth Kelly, MD or one of the following Advanced Practice Providers on your designated Care Team:   Hao Meng, PA-C Angela Duke, PA-C or  Krista Kroeger, PA-C     

## 2020-10-23 ENCOUNTER — Other Ambulatory Visit: Payer: Self-pay

## 2020-10-23 DIAGNOSIS — E785 Hyperlipidemia, unspecified: Secondary | ICD-10-CM

## 2020-10-26 ENCOUNTER — Other Ambulatory Visit: Payer: Self-pay | Admitting: Cardiovascular Disease

## 2020-10-27 DIAGNOSIS — G4733 Obstructive sleep apnea (adult) (pediatric): Secondary | ICD-10-CM | POA: Diagnosis not present

## 2020-10-28 DIAGNOSIS — L309 Dermatitis, unspecified: Secondary | ICD-10-CM | POA: Diagnosis not present

## 2020-10-31 ENCOUNTER — Other Ambulatory Visit: Payer: Self-pay | Admitting: Internal Medicine

## 2020-11-01 ENCOUNTER — Other Ambulatory Visit: Payer: Self-pay | Admitting: Cardiovascular Disease

## 2020-12-11 ENCOUNTER — Other Ambulatory Visit: Payer: Self-pay | Admitting: Internal Medicine

## 2020-12-23 ENCOUNTER — Other Ambulatory Visit: Payer: Self-pay

## 2020-12-23 MED ORDER — NITROGLYCERIN 0.4 MG SL SUBL
SUBLINGUAL_TABLET | SUBLINGUAL | 3 refills | Status: DC
Start: 2020-12-23 — End: 2023-04-27

## 2020-12-23 MED ORDER — CARVEDILOL 3.125 MG PO TABS
3.1250 mg | ORAL_TABLET | Freq: Two times a day (BID) | ORAL | 1 refills | Status: DC
Start: 2020-12-23 — End: 2021-10-06

## 2020-12-24 ENCOUNTER — Telehealth: Payer: Self-pay | Admitting: *Deleted

## 2020-12-24 NOTE — Telephone Encounter (Signed)
[facility] faxed a refill request for Valtrex 1g.  Message sent to PCP as last Rx was given by a different provider.

## 2020-12-25 MED ORDER — VALACYCLOVIR HCL 1 G PO TABS
ORAL_TABLET | ORAL | 1 refills | Status: DC
Start: 2020-12-25 — End: 2022-09-16

## 2020-12-25 NOTE — Telephone Encounter (Signed)
Gyne dr Sabra Heck  Did the last refill  I am OK   taking over this medication.

## 2020-12-30 DIAGNOSIS — G4733 Obstructive sleep apnea (adult) (pediatric): Secondary | ICD-10-CM | POA: Diagnosis not present

## 2020-12-31 NOTE — Addendum Note (Signed)
Addended by: Marrion Coy on: 12/31/2020 03:23 PM   Modules accepted: Orders

## 2021-01-05 ENCOUNTER — Other Ambulatory Visit (INDEPENDENT_AMBULATORY_CARE_PROVIDER_SITE_OTHER): Payer: Medicare Other

## 2021-01-05 ENCOUNTER — Other Ambulatory Visit: Payer: Self-pay

## 2021-01-05 DIAGNOSIS — E785 Hyperlipidemia, unspecified: Secondary | ICD-10-CM

## 2021-01-05 LAB — BASIC METABOLIC PANEL
BUN: 19 mg/dL (ref 6–23)
CO2: 31 mEq/L (ref 19–32)
Calcium: 9.7 mg/dL (ref 8.4–10.5)
Chloride: 104 mEq/L (ref 96–112)
Creatinine, Ser: 0.65 mg/dL (ref 0.40–1.20)
GFR: 85.82 mL/min (ref >60.00)
Glucose, Bld: 96 mg/dL (ref 70–99)
Potassium: 4.3 mEq/L (ref 3.5–5.1)
Sodium: 140 mEq/L (ref 135–145)

## 2021-01-05 LAB — HEPATIC FUNCTION PANEL
ALT: 28 U/L (ref 0–35)
AST: 38 U/L — ABNORMAL HIGH (ref 0–37)
Albumin: 4.4 g/dL (ref 3.5–5.2)
Alkaline Phosphatase: 71 U/L (ref 39–117)
Bilirubin, Direct: 0.1 mg/dL (ref 0.0–0.3)
Total Bilirubin: 0.5 mg/dL (ref 0.2–1.2)
Total Protein: 6.7 g/dL (ref 6.0–8.3)

## 2021-01-06 DIAGNOSIS — R202 Paresthesia of skin: Secondary | ICD-10-CM | POA: Diagnosis not present

## 2021-01-06 DIAGNOSIS — D2261 Melanocytic nevi of right upper limb, including shoulder: Secondary | ICD-10-CM | POA: Diagnosis not present

## 2021-01-06 DIAGNOSIS — L738 Other specified follicular disorders: Secondary | ICD-10-CM | POA: Diagnosis not present

## 2021-01-06 DIAGNOSIS — L578 Other skin changes due to chronic exposure to nonionizing radiation: Secondary | ICD-10-CM | POA: Diagnosis not present

## 2021-01-06 NOTE — Progress Notes (Signed)
As you can see liver tests are better but not totally normal blood sugar is improved. Will follow. And discuss at upcoming appointment.

## 2021-01-07 ENCOUNTER — Other Ambulatory Visit: Payer: Medicare Other

## 2021-01-12 DIAGNOSIS — H2513 Age-related nuclear cataract, bilateral: Secondary | ICD-10-CM | POA: Diagnosis not present

## 2021-01-12 DIAGNOSIS — H04123 Dry eye syndrome of bilateral lacrimal glands: Secondary | ICD-10-CM | POA: Diagnosis not present

## 2021-01-12 DIAGNOSIS — H40013 Open angle with borderline findings, low risk, bilateral: Secondary | ICD-10-CM | POA: Diagnosis not present

## 2021-01-12 DIAGNOSIS — H11823 Conjunctivochalasis, bilateral: Secondary | ICD-10-CM | POA: Diagnosis not present

## 2021-01-15 ENCOUNTER — Ambulatory Visit: Payer: Medicare Other | Admitting: Internal Medicine

## 2021-01-16 ENCOUNTER — Other Ambulatory Visit: Payer: Self-pay | Admitting: Cardiovascular Disease

## 2021-01-18 ENCOUNTER — Other Ambulatory Visit: Payer: Self-pay

## 2021-01-18 ENCOUNTER — Ambulatory Visit (INDEPENDENT_AMBULATORY_CARE_PROVIDER_SITE_OTHER): Payer: Medicare Other | Admitting: Internal Medicine

## 2021-01-18 ENCOUNTER — Encounter: Payer: Self-pay | Admitting: Internal Medicine

## 2021-01-18 VITALS — BP 128/60 | HR 92 | Temp 97.9°F | Ht 66.26 in | Wt 246.8 lb

## 2021-01-18 DIAGNOSIS — R945 Abnormal results of liver function studies: Secondary | ICD-10-CM

## 2021-01-18 DIAGNOSIS — M19071 Primary osteoarthritis, right ankle and foot: Secondary | ICD-10-CM | POA: Diagnosis not present

## 2021-01-18 DIAGNOSIS — M19072 Primary osteoarthritis, left ankle and foot: Secondary | ICD-10-CM

## 2021-01-18 DIAGNOSIS — Z79899 Other long term (current) drug therapy: Secondary | ICD-10-CM

## 2021-01-18 DIAGNOSIS — R7989 Other specified abnormal findings of blood chemistry: Secondary | ICD-10-CM

## 2021-01-18 DIAGNOSIS — M5136 Other intervertebral disc degeneration, lumbar region: Secondary | ICD-10-CM | POA: Diagnosis not present

## 2021-01-18 NOTE — Progress Notes (Signed)
Chief Complaint  Patient presents with  . Follow-up    HPI: Terri Rodriguez 75 y.o. come in for Chronic disease management   WJ:[id number]  constipation  Worse with   Meds. But  meds helps for other functions  Lyrica  helps   Neck issues   had back  Pop this am  When bent over this am  Has caution  Takes Celebrex once a day . Hearing aids doing better See Cards visits  ROS: See pertinent positives and negatives per HPI.  Past Medical History:  Diagnosis Date  . ALLERGIC RHINITIS   . Anginal pain ([place])   . Blood transfusion without reported diagnosis   . Cataract    small right  . Cholelithiasis 06/09/2008   gallstones 2009 on ct scan  . Complication of anesthesia 2012   went to ICU after bilateral knees , unstable vital signs oct 2012, has surgery since did ok  . Coronary artery disease    30 % narrowing lad  . Diverticulitis   . EPICONDYLITIS, LATERAL    osteoarthritis, previous joint replacements  . Fall 10/2018  . GERD   . Hepatic hemangioma 06/09/08  . Hiatal hernia 03/2000  . Hx of adenomatous polyp of colon 06/05/08  . Hx of cardiac catheterization 2008    clean coronarys DrKelly   . Hx of chest pain 2003   neg cath remot hx of narrowwing lad in 2003 nl 2008, none in many years  . HYPERLIPIDEMIA   . IBS (irritable bowel syndrome)   . LEUKOPENIA, CHRONIC   . Mononucleosis 1963  . RAYNAUD'S SYNDROME, HX OF    improved after cardiac meds initiated  . Rosacea    facial  . Sleep apnea   . SLEEP APNEA, OBSTRUCTIVE    cpap, settings "automatic" settings 11-12  . Subacute thyroiditis   . UNSPECIFIED ANEMIA     Family History  Problem Relation Age of Onset  . Rheum arthritis Mother   . Diabetes Mother   . Heart failure Mother   . Thrombocytopenia Mother   . Sleep apnea Mother   . Ulcers Mother        PUD  . Deep vein thrombosis Father   . Pulmonary embolism Father   . Osteoarthritis Father   . Atrial fibrillation Father   . Dementia Father   . Sleep apnea  Father   . Sleep apnea Brother   . Obesity Brother   . Sleep apnea Brother   . Obesity Brother   . Colon cancer Maternal Aunt 4  . Colon cancer Maternal Aunt 90  . Pancreatic cancer Other   . Uterine cancer Other   . Leukemia Other   . Inflammatory bowel disease Other        aunt  . Congenital adrenal hyperplasia Grandchild   . Atrial fibrillation Brother   . Esophageal cancer Neg Hx   . Rectal cancer Neg Hx   . Stomach cancer Neg Hx     Social History   Socioeconomic History  . Marital status: Married    Spouse name: Not on file  . Number of children: 1  . Years of education: Not on file  . Highest education level: Not on file  Occupational History  . Occupation: Physician  Tobacco Use  . Smoking status: Never Smoker  . Smokeless tobacco: Never Used  Vaping Use  . Vaping Use: Never used  Substance and Sexual Activity  . Alcohol use: Yes    Alcohol/week: 14.0  standard drinks    Types: 14 Glasses of wine per week    Comment: wine 2 glasses per day  . Drug use: No  . Sexual activity: Yes    Partners: Male    Birth control/protection: Surgical    Comment: TAH/BSO  Other Topics Concern  . 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   Social Determinants of Health   Financial Resource Strain: Not on file  Food Insecurity: Not on file  Transportation Needs: Not on file  Physical Activity: Not on file  Stress: Not on file  Social Connections: Not on file    Outpatient Medications Prior to Visit  Medication Sig Dispense Refill  . acetaminophen (TYLENOL) 500 MG tablet Take 1,000 mg by mouth every 6 (six) hours as needed for mild pain or headache.    Marland Kitchen amLODipine (NORVASC) 5 MG tablet TAKE 1 TABLET BY MOUTH EVERY DAY 90 tablet 3  . amoxicillin (AMOXIL) 500 MG capsule 2,000 mg. 1 hour prior to dental procedures.    Marland Kitchen aspirin 81 MG tablet Take 81 mg by mouth  daily.    Marland Kitchen atorvastatin (LIPITOR) 20 MG tablet TAKE 1 TABLET BY MOUTH EVERY DAY AT 6PM 90 tablet 3  . Azelaic Acid 15 % cream Apply 1 application topically 2 (two) times daily. After skin is thoroughly washed and patted dry, gently but thoroughly massage a thin film of azelaic acid cream into the affected area t daily,after shower.    . carvedilol (COREG) 3.125 MG tablet Take 1 tablet (3.125 mg total) by mouth 2 (two) times daily with a meal. 180 tablet 1  . celecoxib (CELEBREX) 200 MG capsule TAKE 1 CAPSULE BY MOUTH EVERY DAY 90 capsule 1  . Cholecalciferol (VITAMIN D3) 2000 UNITS TABS Take 1 tablet by mouth daily.    . clindamycin (CLEOCIN T) 1 % external solution 2 (two) times daily as needed.  3  . clobetasol (TEMOVATE) 0.05 % external solution Apply topically.    . diclofenac sodium (VOLTAREN) 1 % GEL Apply 2-4 g topically 4 (four) times daily. As needed for  Arthritis  Pain. 4 Tube 1  . DULoxetine (CYMBALTA) 60 MG capsule TAKE 1 CAPSULE (60 MG TOTAL) BY MOUTH EVERY EVENING. 90 capsule 1  . estradiol (ESTRACE) 0.5 MG tablet 1/2 tab daily 45 tablet 4  . fexofenadine (ALLEGRA) 180 MG tablet Take 180 mg by mouth daily. Takes in PM    . fluocinonide (LIDEX) 0.05 % external solution Apply AA on scalp PRN  0  . ketoconazole (NIZORAL) 2 % cream SMARTSIG:1 Application Topical 1 to 2 Times Daily    . metroNIDAZOLE (METROGEL) 0.75 % gel APPLY TO AFFECTED AREA TWICE A DAY    . Multiple Vitamins-Minerals (CENTRUM ADULTS PO) Take 1 tablet by mouth daily.    . nitroGLYCERIN (NITROSTAT) 0.4 MG SL tablet PLACE 1 TABLET UNDER THE TONGUE EVERY 5 MINUTES AS NEEDED FOR CHEST PAIN 25 tablet 3  . NONFORMULARY OR COMPOUNDED ITEM Topical testosterone cream 1mg /0.57ml.  Apply topically 0.30ml topically two times weekly.  Disp: 3 month supply.  #0RF. 1 each 0  . omeprazole (PRILOSEC) 20 MG capsule Take 20 mg by mouth in the morning and at bedtime.    . pregabalin (LYRICA) 50 MG capsule TAKE 1 CAPSULE BY MOUTH THREE  TIMES A DAY 270 capsule 1  .  valACYclovir (VALTREX) 1000 MG tablet TAKE 2 TABLET BY MOUTH Q12 hours X 2 DOSES WITH FEVER BLISTERS 30 tablet 1  . ramipril (ALTACE) 10 MG capsule TAKE 1 CAPSULE BY MOUTH EVERY DAY 90 capsule 2   No facility-administered medications prior to visit.     EXAM:  BP 128/60 (BP Location: Left Arm, Patient Position: Sitting)   Pulse 92   Temp 97.9 F (36.6 C)   Ht 5' 6.26" (1.683 m)   Wt 246 lb 12.8 oz (111.9 kg)   SpO2 98%   BMI 39.52 kg/m   Body mass index is 39.52 kg/m. Wt Readings from Last 3 Encounters:  01/18/21 246 lb 12.8 oz (111.9 kg)  10/21/20 246 lb 12.8 oz (111.9 kg)  09/23/20 246 lb (111.6 kg)    GENERAL: vitals reviewed and listed above, alert, oriented, appears well hydrated and in no acute distress HEENT: atraumatic, conjunctiva  clear, no obvious abnormalities on inspection of external nose and ears OP : masked  NECK: no obvious masses on inspection palpation  LUNGS: clear to auscultation bilaterally, no wheezes, rales or rhonchi, good air movement CV: HRRR, no clubbing cyanosis or  peripheral edema nl cap refill  Abdomen:  Sof,t normal bowel sounds without hepatosplenomegaly, no guarding rebound or masses  MS: moves all extremities without noticeable focal  Abnormality  Arthritis changes  Ambulatory  PSYCH: pleasant and cooperative, no obvious depression or anxiety Lab Results  Component Value Date   WBC 3.3 (L) 10/15/2020   HGB 12.2 10/15/2020   HCT 36.6 10/15/2020   PLT 199 10/15/2020   GLUCOSE 96 01/05/2021   CHOL 157 10/15/2020   TRIG 97 10/15/2020   HDL 82 10/15/2020   LDLCALC 57 10/15/2020   ALT 28 01/05/2021   AST 38 (H) 01/05/2021   NA 140 01/05/2021   K 4.3 01/05/2021   CL 104 01/05/2021   CREATININE 0.65 01/05/2021   BUN 19 01/05/2021   CO2 31 01/05/2021   TSH 1.840 06/17/2019   INR 0.97 07/10/2014   HGBA1C 5.2 10/15/2020   BP Readings from Last 3 Encounters:  01/18/21 128/60  10/21/20 122/60  09/23/20  128/78    ASSESSMENT AND PLAN:  Discussed the following assessment and plan:  Abnormal LFTs - Plan: US Abdomen Complete  Medication management  Primary osteoarthritis of both feet  Degenerative disc disease, lumbar status post fusion lfts prob from meds etc poss fatty liver   Improve   But get [place]  Last imaging was over 10 years ago  Has gallstones but no sx  Monitoring  Renal function  Medications  -Patient advised to return or notify health care team  if  new concerns arise.  Patient Instructions  Will do  abd [place]   For reasons discussed  No other changes.  If all ok then  6 mos  cpx and labs   rybelsus to consider .  for weight loss .   Standley Brooking. Tyr Franca M.D.

## 2021-01-18 NOTE — Patient Instructions (Addendum)
Will do  abd [place]   For reasons discussed  No other changes.  If all ok then  6 mos  cpx and labs   rybelsus to consider .  for weight loss .

## 2021-01-25 DIAGNOSIS — G4733 Obstructive sleep apnea (adult) (pediatric): Secondary | ICD-10-CM | POA: Diagnosis not present

## 2021-02-02 ENCOUNTER — Other Ambulatory Visit: Payer: Medicare Other

## 2021-02-03 ENCOUNTER — Other Ambulatory Visit: Payer: Medicare Other

## 2021-02-08 ENCOUNTER — Ambulatory Visit
Admission: RE | Admit: 2021-02-08 | Discharge: 2021-02-08 | Disposition: A | Discharge status: Home / Self Care | Payer: Medicare Other | Source: Ambulatory Visit | Attending: Internal Medicine | Admitting: Internal Medicine

## 2021-02-08 DIAGNOSIS — N281 Cyst of kidney, acquired: Secondary | ICD-10-CM | POA: Diagnosis not present

## 2021-02-08 DIAGNOSIS — R945 Abnormal results of liver function studies: Secondary | ICD-10-CM

## 2021-02-08 DIAGNOSIS — K8020 Calculus of gallbladder without cholecystitis without obstruction: Secondary | ICD-10-CM | POA: Diagnosis not present

## 2021-02-08 DIAGNOSIS — R7989 Other specified abnormal findings of blood chemistry: Secondary | ICD-10-CM | POA: Diagnosis not present

## 2021-02-08 DIAGNOSIS — K7689 Other specified diseases of liver: Secondary | ICD-10-CM | POA: Diagnosis not present

## 2021-02-10 ENCOUNTER — Other Ambulatory Visit: Payer: Self-pay | Admitting: Internal Medicine

## 2021-02-11 ENCOUNTER — Telehealth: Payer: Self-pay | Admitting: Internal Medicine

## 2021-02-11 DIAGNOSIS — Z79899 Other long term (current) drug therapy: Secondary | ICD-10-CM

## 2021-02-11 DIAGNOSIS — R945 Abnormal results of liver function studies: Secondary | ICD-10-CM

## 2021-02-11 DIAGNOSIS — R7989 Other specified abnormal findings of blood chemistry: Secondary | ICD-10-CM

## 2021-02-11 DIAGNOSIS — I7300 Raynaud's syndrome without gangrene: Secondary | ICD-10-CM

## 2021-02-11 NOTE — Telephone Encounter (Signed)
Pt is calling in stating that she has received her xray results and was wanting Dr. Regis Bill to let her know what the plan is for her.  Pt is aware that the results has not been received and once she get them they will call her to give the results.  Pt verbalized understanding and stated that she will just wait on Dr. Regis Bill.

## 2021-02-12 NOTE — Progress Notes (Signed)
Spoke with patient on phone   lab has been ordered  for fu  :avoid  alcohol nsaids tylenol as possible. No evidence of abnormal liver size or splenomegaly  .  Nl flows    No masses x stable cysts  and  hc changes   that radiologist  says could be suspicious for cirrhosis . Consider  speciality follow up or other evaluation as indicated .

## 2021-02-12 NOTE — Telephone Encounter (Addendum)
I have discussed results with phone call. Avoiding nsaids tylenol and etoh  And will plan  Fu labs in about 2 months and then go from there.  No clinical evidence of cirrhosis  Consider seeing hepatologist  Or further imagine depending . ( had mri abd 2012 benign cysts)  I will place labs  Orders   record review  Neg celiac 2015 Remote neg hep c  Last ast 47 alt 31  Nl albumin and plt count

## 2021-02-12 NOTE — Addendum Note (Signed)
Addended byShanon Ace K on: 02/12/2021 02:54 PM   Modules accepted: Orders

## 2021-04-06 ENCOUNTER — Other Ambulatory Visit: Payer: Self-pay | Admitting: Cardiovascular Disease

## 2021-04-09 ENCOUNTER — Other Ambulatory Visit: Payer: Self-pay | Admitting: Cardiovascular Disease

## 2021-04-09 ENCOUNTER — Other Ambulatory Visit: Payer: Self-pay | Admitting: Internal Medicine

## 2021-04-09 NOTE — Telephone Encounter (Signed)
Last refill- 10/21/2020 Last office visit- 01/18/2021  CPE scheduled-08/03/21

## 2021-04-12 ENCOUNTER — Ambulatory Visit (INDEPENDENT_AMBULATORY_CARE_PROVIDER_SITE_OTHER): Payer: Medicare Other

## 2021-04-12 ENCOUNTER — Other Ambulatory Visit: Payer: Self-pay

## 2021-04-12 DIAGNOSIS — Z0000 Encounter for general adult medical examination without abnormal findings: Secondary | ICD-10-CM

## 2021-04-12 NOTE — Patient Instructions (Signed)
Terri Rodriguez , Thank you for taking time to come for your Medicare Wellness Visit. I appreciate your ongoing commitment to your health goals. Please review the following plan we discussed and let me know if I can assist you in the future.   Screening recommendations/referrals: Colonoscopy: current due 07/09/2024 Mammogram: current due 09/09/2020 Bone Density: current 09/03/2022 Recommended yearly ophthalmology/optometry visit for glaucoma screening and checkup Recommended yearly dental visit for hygiene and checkup  Vaccinations: Influenza vaccine: current due in fall 2022 Pneumococcal vaccine: completed series  Tdap vaccine: current  due  01/11/2024 Shingles vaccine: will obtain local pharmacy   Advanced directives: will look into obtaining a loving will   Conditions/risks identified:none   Next appointment: none    Preventive Care 16 Years and Older, Female Preventive care refers to lifestyle choices and visits with your health care provider that can promote health and wellness. What does preventive care include?  A yearly physical exam. This is also called an annual well check.  Dental exams once or twice a year.  Routine eye exams. Ask your health care provider how often you should have your eyes checked.  Personal lifestyle choices, including:  Daily care of your teeth and gums.  Regular physical activity.  Eating a healthy diet.  Avoiding tobacco and drug use.  Limiting alcohol use.  Practicing safe sex.  Taking low-dose aspirin every day.  Taking vitamin and mineral supplements as recommended by your health care provider. What happens during an annual well check? The services and screenings done by your health care provider during your annual well check will depend on your age, overall health, lifestyle risk factors, and family history of disease. Counseling  Your health care provider may ask you questions about your:  Alcohol use.  Tobacco use.  Drug  use.  Emotional well-being.  Home and relationship well-being.  Sexual activity.  Eating habits.  History of falls.  Memory and ability to understand (cognition).  Work and work Statistician.  Reproductive health. Screening  You may have the following tests or measurements:  Height, weight, and BMI.  Blood pressure.  Lipid and cholesterol levels. These may be checked every 5 years, or more frequently if you are over 58 years old.  Skin check.  Lung cancer screening. You may have this screening every year starting at age 54 if you have a 30-pack-year history of smoking and currently smoke or have quit within the past 15 years.  Fecal occult blood test (FOBT) of the stool. You may have this test every year starting at age 71.  Flexible sigmoidoscopy or colonoscopy. You may have a sigmoidoscopy every 5 years or a colonoscopy every 10 years starting at age 12.  Hepatitis C blood test.  Hepatitis B blood test.  Sexually transmitted disease (STD) testing.  Diabetes screening. This is done by checking your blood sugar (glucose) after you have not eaten for a while (fasting). You may have this done every 1-3 years.  Bone density scan. This is done to screen for osteoporosis. You may have this done starting at age 20.  Mammogram. This may be done every 1-2 years. Talk to your health care provider about how often you should have regular mammograms. Talk with your health care provider about your test results, treatment options, and if necessary, the need for more tests. Vaccines  Your health care provider may recommend certain vaccines, such as:  Influenza vaccine. This is recommended every year.  Tetanus, diphtheria, and acellular pertussis (Tdap, Td) vaccine. You  may need a Td booster every 10 years.  Zoster vaccine. You may need this after age 2.  Pneumococcal 13-valent conjugate (PCV13) vaccine. One dose is recommended after age 53.  Pneumococcal polysaccharide  (PPSV23) vaccine. One dose is recommended after age 48. Talk to your health care provider about which screenings and vaccines you need and how often you need them. This information is not intended to replace advice given to you by your health care provider. Make sure you discuss any questions you have with your health care provider. Document Released: 12/18/2015 Document Revised: 08/10/2016 Document Reviewed: 09/22/2015 Elsevier Interactive Patient Education  2017 [place] Prevention in the Home Falls can cause injuries. They can happen to people of all ages. There are many things you can do to make your home safe and to help prevent falls. What can I do on the outside of my home?  Regularly fix the edges of walkways and driveways and fix any cracks.  Remove anything that might make you trip as you walk through a door, such as a raised step or threshold.  Trim any bushes or trees on the path to your home.  Use bright outdoor lighting.  Clear any walking paths of anything that might make someone trip, such as rocks or tools.  Regularly check to see if handrails are loose or broken. Make sure that both sides of any steps have handrails.  Any raised decks and porches should have guardrails on the edges.  Have any leaves, snow, or ice cleared regularly.  Use sand or salt on walking paths during winter.  Clean up any spills in your garage right away. This includes oil or grease spills. What can I do in the bathroom?  Use night lights.  Install grab bars by the toilet and in the tub and shower. Do not use towel bars as grab bars.  Use non-skid mats or decals in the tub or shower.  If you need to sit down in the shower, use a plastic, non-slip stool.  Keep the floor dry. Clean up any water that spills on the floor as soon as it happens.  Remove soap buildup in the tub or shower regularly.  Attach bath mats securely with double-sided non-slip rug tape.  Do not have  throw rugs and other things on the floor that can make you trip. What can I do in the bedroom?  Use night lights.  Make sure that you have a light by your bed that is easy to reach.  Do not use any sheets or blankets that are too big for your bed. They should not hang down onto the floor.  Have a firm chair that has side arms. You can use this for support while you get dressed.  Do not have throw rugs and other things on the floor that can make you trip. What can I do in the kitchen?  Clean up any spills right away.  Avoid walking on wet floors.  Keep items that you use a lot in easy-to-reach places.  If you need to reach something above you, use a strong step stool that has a grab bar.  Keep electrical cords out of the way.  Do not use floor polish or wax that makes floors slippery. If you must use wax, use non-skid floor wax.  Do not have throw rugs and other things on the floor that can make you trip. What can I do with my stairs?  Do not leave any items  on the stairs.  Make sure that there are handrails on both sides of the stairs and use them. Fix handrails that are broken or loose. Make sure that handrails are as long as the stairways.  Check any carpeting to make sure that it is firmly attached to the stairs. Fix any carpet that is loose or worn.  Avoid having throw rugs at the top or bottom of the stairs. If you do have throw rugs, attach them to the floor with carpet tape.  Make sure that you have a light switch at the top of the stairs and the bottom of the stairs. If you do not have them, ask someone to add them for you. What else can I do to help prevent falls?  Wear shoes that:  Do not have high heels.  Have rubber bottoms.  Are comfortable and fit you well.  Are closed at the toe. Do not wear sandals.  If you use a stepladder:  Make sure that it is fully opened. Do not climb a closed stepladder.  Make sure that both sides of the stepladder are  locked into place.  Ask someone to hold it for you, if possible.  Clearly mark and make sure that you can see:  Any grab bars or handrails.  First and last steps.  Where the edge of each step is.  Use tools that help you move around (mobility aids) if they are needed. These include:  Canes.  Walkers.  Scooters.  Crutches.  Turn on the lights when you go into a dark area. Replace any light bulbs as soon as they burn out.  Set up your furniture so you have a clear path. Avoid moving your furniture around.  If any of your floors are uneven, fix them.  If there are any pets around you, be aware of where they are.  Review your medicines with your doctor. Some medicines can make you feel dizzy. This can increase your chance of falling. Ask your doctor what other things that you can do to help prevent falls. This information is not intended to replace advice given to you by your health care provider. Make sure you discuss any questions you have with your health care provider. Document Released: 09/17/2009 Document Revised: 04/28/2016 Document Reviewed: 12/26/2014 Elsevier Interactive Patient Education  2017 [employer].

## 2021-04-12 NOTE — Progress Notes (Signed)
Subjective:   Terri Rodriguez is a 75 y.o. female who presents for Medicare Annual (Subsequent) preventive examination.  I connected with Merleen Nicely  today by telephone and verified that I am speaking with the correct person using two identifiers. Location patient: home Location provider: work Persons participating in the virtual visit: patient, provider.   I discussed the limitations, risks, security and privacy concerns of performing an evaluation and management service by telephone and the availability of in person appointments. I also discussed with the patient that there may be a patient responsible charge related to this service. The patient expressed understanding and verbally consented to this telephonic visit.    Interactive audio and video telecommunications were attempted between this provider and patient, however failed, due to patient having technical difficulties OR patient did not have access to video capability.  We continued and completed visit with audio only.     Review of Systems    Cardiac Risk Factors include: advanced age (>59men, >75 women);dyslipidemia;hypertensionn/a     Objective:    Today's Vitals   There is no height or weight on file to calculate BMI.  Advanced Directives 04/12/2021 09/09/2019 06/22/2015 07/16/2014 07/10/2014 10/21/2013 10/15/2013  Does Patient Have a Medical Advance Directive? No No No No Patient does not have advance directive;Patient would not like information Patient does not have advance directive;Patient would not like information Patient does not have advance directive;Patient would not like information  Would patient like information on creating a medical advance directive? No - Patient declined No - Patient declined - No - patient declined information - - -  Pre-existing out of facility DNR order (yellow form or pink MOST form) - - - - No No No    Current Medications (verified) Outpatient Encounter Medications as of 04/12/2021   Medication Sig  . amLODipine (NORVASC) 5 MG tablet TAKE 1 TABLET BY MOUTH EVERY DAY  . amoxicillin (AMOXIL) 500 MG capsule 2,000 mg. 1 hour prior to dental procedures.  Marland Kitchen aspirin 81 MG tablet Take 81 mg by mouth daily.  Marland Kitchen atorvastatin (LIPITOR) 20 MG tablet TAKE 1 TABLET BY MOUTH EVERY DAY AT 6PM  . Azelaic Acid 15 % cream Apply 1 application topically 2 (two) times daily. After skin is thoroughly washed and patted dry, gently but thoroughly massage a thin film of azelaic acid cream into the affected area t daily,after shower.  . carvedilol (COREG) 3.125 MG tablet Take 1 tablet (3.125 mg total) by mouth 2 (two) times daily with a meal.  . Cholecalciferol (VITAMIN D3) 2000 UNITS TABS Take 1 tablet by mouth daily.  . clindamycin (CLEOCIN T) 1 % external solution 2 (two) times daily as needed.  . diclofenac sodium (VOLTAREN) 1 % GEL Apply 2-4 g topically 4 (four) times daily. As needed for  Arthritis  Pain.  . DULoxetine (CYMBALTA) 60 MG capsule TAKE 1 CAPSULE (60 MG TOTAL) BY MOUTH EVERY EVENING.  Marland Kitchen estradiol (ESTRACE) 0.5 MG tablet 1/2 tab daily  . fexofenadine (ALLEGRA) 180 MG tablet Take 180 mg by mouth daily. Takes in PM  . fluocinonide (LIDEX) 0.05 % external solution Apply AA on scalp PRN  . ketoconazole (NIZORAL) 2 % cream SMARTSIG:1 Application Topical 1 to 2 Times Daily  . metroNIDAZOLE (METROGEL) 0.75 % gel APPLY TO AFFECTED AREA TWICE A DAY  . Multiple Vitamins-Minerals (CENTRUM ADULTS PO) Take 1 tablet by mouth daily.  . nitroGLYCERIN (NITROSTAT) 0.4 MG SL tablet PLACE 1 TABLET UNDER THE TONGUE EVERY 5 MINUTES AS  NEEDED FOR CHEST PAIN  . omeprazole (PRILOSEC) 20 MG capsule Take 20 mg by mouth in the morning and at bedtime.  . pregabalin (LYRICA) 50 MG capsule TAKE 1 CAPSULE BY MOUTH 3 TIMES DAILY  . ramipril (ALTACE) 10 MG capsule TAKE 1 CAPSULE BY MOUTH EVERY DAY  . valACYclovir (VALTREX) 1000 MG tablet TAKE 2 TABLET BY MOUTH Q12 hours X 2 DOSES WITH FEVER BLISTERS  . acetaminophen  (TYLENOL) 500 MG tablet Take 1,000 mg by mouth every 6 (six) hours as needed for mild pain or headache. (Patient not taking: Reported on 04/12/2021)  . celecoxib (CELEBREX) 200 MG capsule TAKE 1 CAPSULE BY MOUTH EVERY DAY (Patient not taking: Reported on 04/12/2021)  . clobetasol (TEMOVATE) 0.05 % external solution Apply topically. (Patient not taking: Reported on 04/12/2021)  . NONFORMULARY OR COMPOUNDED ITEM Topical testosterone cream 1mg /0.62ml.  Apply topically 0.24ml topically two times weekly.  Disp: 3 month supply.  #0RF. (Patient not taking: Reported on 04/12/2021)   No facility-administered encounter medications on file as of 04/12/2021.    Allergies (verified) Codeine and Crab [shellfish allergy]   History: Past Medical History:  Diagnosis Date  . ALLERGIC RHINITIS   . Anginal pain ([place])   . Blood transfusion without reported diagnosis   . Cataract    small right  . Cholelithiasis 06/09/2008   gallstones 2009 on ct scan  . Complication of anesthesia 2012   went to ICU after bilateral knees , unstable vital signs oct 2012, has surgery since did ok  . Coronary artery disease    30 % narrowing lad  . Diverticulitis   . EPICONDYLITIS, LATERAL    osteoarthritis, previous joint replacements  . Fall 10/2018  . GERD   . Hepatic hemangioma 06/09/08  . Hiatal hernia 03/2000  . Hx of adenomatous polyp of colon 06/05/08  . Hx of cardiac catheterization 2008    clean coronarys DrKelly   . Hx of chest pain 2003   neg cath remot hx of narrowwing lad in 2003 nl 2008, none in many years  . HYPERLIPIDEMIA   . IBS (irritable bowel syndrome)   . LEUKOPENIA, CHRONIC   . Mononucleosis 1963  . RAYNAUD'S SYNDROME, HX OF    improved after cardiac meds initiated  . Rosacea    facial  . Sleep apnea   . SLEEP APNEA, OBSTRUCTIVE    cpap, settings "automatic" settings 11-12  . Subacute thyroiditis   . UNSPECIFIED ANEMIA    Past Surgical History:  Procedure Laterality Date  . ABDOMINAL HYSTERECTOMY   1993   bso  . BACK SURGERY  2008   l 3 to l4 l4 to l5  . BARTHOLIN GLAND CYST EXCISION Right 09/09/2019   Procedure: EXCISION OF VULVAR MASS TIMES TWO;  Surgeon: Megan Salon, MD;  Location: [facility];  Service: Gynecology;  Laterality: Right;  . CARDIAC CATHETERIZATION  04/07/2007   Noncritical coronary artery disease. Contiue medical therapy.  Marland Kitchen CARDIAC CATHETERIZATION  12/03/2007   Normal LV function. Mild angiographic mitral valve prolapse. Normal coronary arteries.  Marland Kitchen CARDIOVASCULAR STRESS TEST  11/09/2007   Moderate ischemia in Mid Anterior, Mid Anteroseptal, Apical Anterior, and Apical Septal regions. EKG negative for ischemia.  Marland Kitchen [place]  . dental implants  11/2002,11/2011,11/2012  . dental implants     x 6  . DENTAL SURGERY    . DILATION AND CURETTAGE OF UTERUS     d and e 1988 and 1990  . JOINT  REPLACEMENT  09/21/2011   bilateral knee  . KNEE ARTHROSCOPY     bilateral  . lumbar surgery fixation with disc replacement  10/08   Left  . NASAL SEPTOPLASTY W/ TURBINOPLASTY  05/2000  . NOCTURNAL POLYSOMNOGRAM  12/31/2006   Severe obstructive sleep apnea. AHI-87/hr  . REPLACEMENT TOTAL KNEE  2012   bilateral  . TONSILLECTOMY  1952   adenoids too  . TOTAL HIP ARTHROPLASTY Left 10/21/2013   Procedure: LEFT TOTAL HIP ARTHROPLASTY ANTERIOR APPROACH;  Surgeon: [name], MD;  Location: [facility];  Service: Orthopedics;  Laterality: Left;  . TOTAL HIP ARTHROPLASTY Right 07/16/2014   Procedure: RIGHT TOTAL HIP ARTHROPLASTY ANTERIOR APPROACH;  Surgeon: [name], MD;  Location: [facility];  Service: Orthopedics;  Laterality: Right;  . TRANSTHORACIC ECHOCARDIOGRAM  11/09/2007   EF 53%, LV systolic function normal. Mild aortic root dilation.  . WRIST SURGERY Left 04/03/14   ORIF "distal radial head fracture"   Family History  Problem Relation Age of Onset  . Rheum arthritis Mother   . Diabetes Mother   . Heart failure Mother   .  Thrombocytopenia Mother   . Sleep apnea Mother   . Ulcers Mother        PUD  . Deep vein thrombosis Father   . Pulmonary embolism Father   . Osteoarthritis Father   . Atrial fibrillation Father   . Dementia Father   . Sleep apnea Father   . Sleep apnea Brother   . Obesity Brother   . Sleep apnea Brother   . Obesity Brother   . Colon cancer Maternal Aunt 38  . Colon cancer Maternal Aunt 90  . Pancreatic cancer Other   . Uterine cancer Other   . Leukemia Other   . Inflammatory bowel disease Other        aunt  . Congenital adrenal hyperplasia Grandchild   . Atrial fibrillation Brother   . Esophageal cancer Neg Hx   . Rectal cancer Neg Hx   . Stomach cancer Neg Hx    Social History   Socioeconomic History  . Marital status: Married    Spouse name: Not on file  . Number of children: 1  . Years of education: Not on file  . Highest education level: Not on file  Occupational History  . Occupation: Physician  Tobacco Use  . Smoking status: Never Smoker  . Smokeless tobacco: Never Used  Vaping Use  . Vaping Use: Never used  Substance and Sexual Activity  . Alcohol use: Yes    Alcohol/week: 14.0 standard drinks    Types: 14 Glasses of wine per week    Comment: wine 2 glasses per day  . Drug use: No  . Sexual activity: Yes    Partners: Male    Birth control/protection: Surgical    Comment: TAH/BSO  Other Topics Concern  . 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   Social Determinants of Health   Financial Resource Strain: Low Risk   . Difficulty of Paying Living Expenses: Not hard at all  Food Insecurity: No Food Insecurity  . Worried About Charity fundraiser in the Last Year: Never true  . Ran Out of Food in the Last Year: Never true  Transportation Needs: No Transportation Needs  . Lack of Transportation (Medical): No  . Lack  of Transportation  (Non-Medical): No  Physical Activity: Inactive  . Days of Exercise per Week: 0 days  . Minutes of Exercise per Session: 0 min  Stress: No Stress Concern Present  . Feeling of Stress : Not at all  Social Connections: Moderately Integrated  . Frequency of Communication with Friends and Family: More than three times a week  . Frequency of Social Gatherings with Friends and Family: More than three times a week  . Attends Religious Services: 1 to 4 times per year  . Active Member of Clubs or Organizations: No  . Attends Archivist Meetings: Never  . Marital Status: Married    Tobacco Counseling Counseling given: Not Answered   Clinical Intake:  Pre-visit preparation completed: Yes  Pain : No/denies pain     Nutritional Risks: None Diabetes: No  How often do you need to have someone help you when you read instructions, pamphlets, or other written materials from your doctor or pharmacy?: 1 - Never What is the last grade level you completed in school?: doctor  Diabetic?no  Interpreter Needed?: No  Information entered by :: [place] of Daily Living In your present state of health, do you have any difficulty performing the following activities: 04/12/2021  Hearing? N  Vision? N  Difficulty concentrating or making decisions? N  Walking or climbing stairs? N  Dressing or bathing? N  Doing errands, shopping? N  Preparing Food and eating ? N  Using the Toilet? N  In the past six months, have you accidently leaked urine? N  Do you have problems with loss of bowel control? N  Managing your Medications? N  Managing your Finances? N  Housekeeping or managing your Housekeeping? N  Some recent data might be hidden    Patient Care Team: Panosh, Standley Brooking, MD as PCP - General [facility] [place] Joyice Faster, MD (Cardiology) Jovita Gamma, MD (Neurosurgery) Megan Salon, MD as Attending Physician (Obstetrics and Gynecology) Gaynelle Arabian, MD as Attending Physician  (Orthopedic Surgery) Deneise Lever, MD (Pulmonary Disease) Bo Merino, MD as Consulting Physician (Rheumatology) Jari Pigg, MD as Consulting Physician (Dermatology) Calvert Cantor, MD as Consulting Physician (Ophthalmology) Iran Planas, MD as Consulting Physician (Orthopedic Surgery)  Indicate any recent Medical Services you may have received from other than [facility] providers in the past year (date may be approximate).     Assessment:   This is a routine wellness examination for [place].  Hearing/Vision screen  Hearing Screening   125Hz  250Hz  500Hz  1000Hz  2000Hz  3000Hz  4000Hz  6000Hz  8000Hz   Right ear:           Left ear:           Vision Screening Comments: Annual eye exam s wears glasses   Dietary issues and exercise activities discussed: Current Exercise Habits: The patient does not participate in regular exercise at present, Exercise limited by: orthopedic condition(s)  Goals Addressed            This Visit's Progress   . Exercise 3x per week (30 min per time)        Depression Screen PHQ 2/9 Scores 04/12/2021 01/18/2021 09/16/2020 09/02/2019 02/05/2018 02/01/2017 01/29/2016  PHQ - 2 Score 0 0 0 1 0 0 0    Fall Risk Fall Risk  04/12/2021 01/18/2021 09/16/2020 09/02/2019 02/05/2018  Falls in the past year? 0 0 0 1 No  Number falls in past yr: 0 0 0 0 -  Injury with Fall? 0 0 - 1 -  Risk for fall due to : - - - History of fall(s) -  Follow up Falls evaluation completed Falls evaluation completed Falls evaluation completed Falls evaluation completed -    FALL RISK PREVENTION PERTAINING TO THE HOME:  Any stairs in or around the home? Yes  If so, are there any without handrails? Yes  Home free of loose throw rugs in walkways, pet beds, electrical cords, etc? Yes  Adequate lighting in your home to reduce risk of falls? Yes   ASSISTIVE DEVICES UTILIZED TO PREVENT FALLS:  Life alert? No  Use of a cane, walker or w/c? Yes  Grab bars in the bathroom? Yes  Shower chair or  bench in shower? Yes  Elevated toilet seat or a handicapped toilet? Yes    Cognitive Function:     Normal cognitive status assessed by direct observation by this Nurse Health Advisor. No abnormalities found.      Immunizations Immunization History  Administered Date(s) Administered  . Fluad Quad(high Dose 65+) 09/02/2019, 09/16/2020  . Influenza Nasal 09/16/2020  . Influenza Split 09/05/2011, 09/19/2012, 09/19/2017  . Influenza Whole 09/09/2009, 10/12/2010  . Influenza, High Dose Seasonal PF 09/08/2014, 10/01/2015, 09/07/2018  . Influenza,inj,Quad PF,6+ Mos 09/06/2013, 07/29/2016  . Influenza,inj,quad, With Preservative 09/19/2017, 08/06/2019  . PFIZER(Purple Top)SARS-COV-2 Vaccination 12/27/2019, 01/17/2020, 08/31/2020  . Pneumococcal Conjugate-13 01/10/2014  . Pneumococcal Polysaccharide-23 04/26/2010  . Td 12/05/2001, 01/10/2014  . Zoster 09/19/2012     TDAP status: Up to date  Flu Vaccine status: Up to date  Pneumococcal vaccine status: Up to date  Covid-19 vaccine status: Completed vaccines  Qualifies for Shingles Vaccine? Yes   Zostavax completed No   Shingrix Completed?: No.    Education has been provided regarding the importance of this vaccine. Patient has been advised to call insurance company to determine out of pocket expense if they have not yet received this vaccine. Advised may also receive vaccine at local pharmacy or Health Dept. Verbalized acceptance and understanding.  Screening Tests Health Maintenance  Topic Date Due  . OPHTHALMOLOGY EXAM  12/12/2018  . COVID-19 Vaccine (4 - Booster for Pfizer series) 02/28/2021  . INFLUENZA VACCINE  07/05/2021  . TETANUS/TDAP  01/11/2024  . DEXA SCAN  Completed  . Hepatitis C Screening  Completed  . PNA vac Low Risk Adult  Completed  . HPV VACCINES  Aged Out    Health Maintenance  Health Maintenance Due  Topic Date Due  . OPHTHALMOLOGY EXAM  12/12/2018  . COVID-19 Vaccine (4 - Booster for Pfizer series)  02/28/2021    Colorectal cancer screening: Type of screening: Colonoscopy. Completed 07/10/2019. Repeat every 5 years  Mammogram status: Completed 09/09/2020. Repeat every year  Bone Density status: Completed 09/04/2019. Results reflect: Bone density results: OSTEOPENIA. Repeat every 3 years.  Lung Cancer Screening: (Low Dose CT Chest recommended if Age 71-80 years, 30 pack-year currently smoking OR have quit w/in 15years.) does not qualify.   Lung Cancer Screening Referral: n/a  Additional Screening:  Hepatitis C Screening: does not qualify  Vision Screening: Recommended annual ophthalmology exams for early detection of glaucoma and other disorders of the eye. Is the patient up to date with their annual eye exam?  Yes  Who is the provider or what is the name of the office in which the patient attends annual eye exams? Dr.Parker If pt is not established with a provider, would they like to be referred to a provider to establish care? No .   Dental Screening: Recommended annual dental exams  for proper oral hygiene  Community Resource Referral / Chronic Care Management: CRR required this visit?  No   CCM required this visit?  No      Plan:     I have personally reviewed and noted the following in the patient's chart:   . Medical and social history . Use of alcohol, tobacco or illicit drugs  . Current medications and supplements including opioid prescriptions.  . Functional ability and status . Nutritional status . Physical activity . Advanced directives . List of other physicians . Hospitalizations, surgeries, and ER visits in previous 12 months . Vitals . Screenings to include cognitive, depression, and falls . Referrals and appointments  In addition, I have reviewed and discussed with patient certain preventive protocols, quality metrics, and best practice recommendations. A written personalized care plan for preventive services as well as general preventive health  recommendations were provided to patient.     Randel Pigg, LPN   03/05/9378   Nurse Notes: none

## 2021-04-13 ENCOUNTER — Other Ambulatory Visit (INDEPENDENT_AMBULATORY_CARE_PROVIDER_SITE_OTHER): Payer: Medicare Other

## 2021-04-13 DIAGNOSIS — I7300 Raynaud's syndrome without gangrene: Secondary | ICD-10-CM | POA: Diagnosis not present

## 2021-04-13 DIAGNOSIS — R945 Abnormal results of liver function studies: Secondary | ICD-10-CM | POA: Diagnosis not present

## 2021-04-13 DIAGNOSIS — R7989 Other specified abnormal findings of blood chemistry: Secondary | ICD-10-CM

## 2021-04-13 DIAGNOSIS — Z79899 Other long term (current) drug therapy: Secondary | ICD-10-CM

## 2021-04-13 LAB — CBC WITH DIFFERENTIAL/PLATELET
Basophils Absolute: 0 10*3/uL (ref 0.0–0.1)
Basophils Relative: 1.2 % (ref 0.0–3.0)
Eosinophils Absolute: 0.1 10*3/uL (ref 0.0–0.7)
Eosinophils Relative: 3.6 % (ref 0.0–5.0)
HCT: 35.5 % — ABNORMAL LOW (ref 36.0–46.0)
Hemoglobin: 12.4 g/dL (ref 12.0–15.0)
Lymphocytes Relative: 45.4 % (ref 12.0–46.0)
Lymphs Abs: 1.4 10*3/uL (ref 0.7–4.0)
MCHC: 35 g/dL (ref 30.0–36.0)
MCV: 93.1 fl (ref 78.0–100.0)
Monocytes Absolute: 0.2 10*3/uL (ref 0.1–1.0)
Monocytes Relative: 7.5 % (ref 3.0–12.0)
Neutro Abs: 1.3 10*3/uL — ABNORMAL LOW (ref 1.4–7.7)
Neutrophils Relative %: 42.3 % — ABNORMAL LOW (ref 43.0–77.0)
Platelets: 187 10*3/uL (ref 150.0–400.0)
RBC: 3.81 Mil/uL — ABNORMAL LOW (ref 3.87–5.11)
RDW: 13.3 % (ref 11.5–15.5)
WBC: 3.1 10*3/uL — ABNORMAL LOW (ref 4.0–10.5)

## 2021-04-13 LAB — HEPATIC FUNCTION PANEL
ALT: 40 U/L — ABNORMAL HIGH (ref 0–35)
AST: 46 U/L — ABNORMAL HIGH (ref 0–37)
Albumin: 4.2 g/dL (ref 3.5–5.2)
Alkaline Phosphatase: 74 U/L (ref 39–117)
Bilirubin, Direct: 0.1 mg/dL (ref 0.0–0.3)
Total Bilirubin: 0.5 mg/dL (ref 0.2–1.2)
Total Protein: 6.3 g/dL (ref 6.0–8.3)

## 2021-04-13 LAB — PROTIME-INR
INR: 1 ratio (ref 0.8–1.0)
Prothrombin Time: 11.6 s (ref 9.6–13.1)

## 2021-04-13 LAB — IBC + FERRITIN
Ferritin: 35.4 ng/mL (ref 10.0–291.0)
Iron: 98 ug/dL (ref 42–145)
Saturation Ratios: 29.7 % (ref 20.0–50.0)
Transferrin: 236 mg/dL (ref 212.0–360.0)

## 2021-04-13 LAB — GAMMA GT: GGT: 14 U/L (ref 7–51)

## 2021-04-13 LAB — C-REACTIVE PROTEIN: CRP: <1 mg/dL (ref 0.5–20.0)

## 2021-04-14 NOTE — Progress Notes (Signed)
Tests negative so far for autoimmune other causes of abnormal liver tests.

## 2021-04-18 LAB — ALPHA-1-ANTITRYPSIN: A-1 Antitrypsin, Ser: 124 mg/dL (ref 83–199)

## 2021-04-18 LAB — HEPATITIS C ANTIBODY
Hepatitis C Ab: NONREACTIVE
SIGNAL TO CUT-OFF: 0 (ref <1.00)

## 2021-04-18 LAB — HEPATITIS B CORE ANTIBODY, TOTAL: Hep B Core Total Ab: NONREACTIVE

## 2021-04-18 LAB — HEPATITIS B SURFACE ANTIBODY,QUALITATIVE: Hep B S Ab: REACTIVE — AB

## 2021-04-18 LAB — ANA: Anti Nuclear Antibody (ANA): NEGATIVE

## 2021-04-18 LAB — ANTI-SMOOTH MUSCLE ANTIBODY, IGG: Actin (Smooth Muscle) Antibody (IGG): <20 U (ref <20)

## 2021-04-18 LAB — CERULOPLASMIN: Ceruloplasmin: 28 mg/dL (ref 18–53)

## 2021-04-18 LAB — HEPATITIS B SURFACE ANTIGEN: Hepatitis B Surface Ag: NONREACTIVE

## 2021-04-26 DIAGNOSIS — G4733 Obstructive sleep apnea (adult) (pediatric): Secondary | ICD-10-CM | POA: Diagnosis not present

## 2021-05-05 DIAGNOSIS — M25571 Pain in right ankle and joints of right foot: Secondary | ICD-10-CM | POA: Diagnosis not present

## 2021-05-05 DIAGNOSIS — M79671 Pain in right foot: Secondary | ICD-10-CM | POA: Diagnosis not present

## 2021-05-05 DIAGNOSIS — M67969 Unspecified disorder of synovium and tendon, unspecified lower leg: Secondary | ICD-10-CM | POA: Diagnosis not present

## 2021-05-07 DIAGNOSIS — M79671 Pain in right foot: Secondary | ICD-10-CM | POA: Diagnosis not present

## 2021-05-10 DIAGNOSIS — M6701 Short Achilles tendon (acquired), right ankle: Secondary | ICD-10-CM | POA: Diagnosis not present

## 2021-05-10 DIAGNOSIS — M7661 Achilles tendinitis, right leg: Secondary | ICD-10-CM | POA: Diagnosis not present

## 2021-05-16 NOTE — Progress Notes (Signed)
Normal serologies .  So I agree with limited scarce to no alcohol and  need for follow up.  We can do referral to GI  for opinion about optimum  follow up and diagnostic testing advised . Otherwise would repeat   LFT'S  cbc diff platelets in  3-4 months .   Let us know if you want to do a referral to GI

## 2021-06-14 DIAGNOSIS — M7661 Achilles tendinitis, right leg: Secondary | ICD-10-CM | POA: Diagnosis not present

## 2021-06-14 DIAGNOSIS — M6701 Short Achilles tendon (acquired), right ankle: Secondary | ICD-10-CM | POA: Diagnosis not present

## 2021-06-16 ENCOUNTER — Other Ambulatory Visit: Payer: Self-pay | Admitting: Internal Medicine

## 2021-06-25 DIAGNOSIS — M79671 Pain in right foot: Secondary | ICD-10-CM | POA: Diagnosis not present

## 2021-06-29 DIAGNOSIS — G4733 Obstructive sleep apnea (adult) (pediatric): Secondary | ICD-10-CM | POA: Diagnosis not present

## 2021-07-08 DIAGNOSIS — M79671 Pain in right foot: Secondary | ICD-10-CM | POA: Diagnosis not present

## 2021-07-12 DIAGNOSIS — M79671 Pain in right foot: Secondary | ICD-10-CM | POA: Diagnosis not present

## 2021-07-14 ENCOUNTER — Other Ambulatory Visit: Payer: Self-pay | Admitting: Cardiovascular Disease

## 2021-07-14 DIAGNOSIS — M7661 Achilles tendinitis, right leg: Secondary | ICD-10-CM | POA: Diagnosis not present

## 2021-07-16 DIAGNOSIS — M79671 Pain in right foot: Secondary | ICD-10-CM | POA: Diagnosis not present

## 2021-07-26 DIAGNOSIS — G4733 Obstructive sleep apnea (adult) (pediatric): Secondary | ICD-10-CM | POA: Diagnosis not present

## 2021-08-03 ENCOUNTER — Encounter: Payer: Self-pay | Admitting: Internal Medicine

## 2021-08-03 ENCOUNTER — Ambulatory Visit (INDEPENDENT_AMBULATORY_CARE_PROVIDER_SITE_OTHER): Payer: Medicare Other | Admitting: Internal Medicine

## 2021-08-03 ENCOUNTER — Other Ambulatory Visit: Payer: Self-pay

## 2021-08-03 VITALS — BP 116/60 | HR 77 | Temp 98.0°F | Ht 64.5 in | Wt 251.2 lb

## 2021-08-03 DIAGNOSIS — G8929 Other chronic pain: Secondary | ICD-10-CM

## 2021-08-03 DIAGNOSIS — M19042 Primary osteoarthritis, left hand: Secondary | ICD-10-CM

## 2021-08-03 DIAGNOSIS — Z0000 Encounter for general adult medical examination without abnormal findings: Secondary | ICD-10-CM | POA: Diagnosis not present

## 2021-08-03 DIAGNOSIS — E785 Hyperlipidemia, unspecified: Secondary | ICD-10-CM

## 2021-08-03 DIAGNOSIS — Z79899 Other long term (current) drug therapy: Secondary | ICD-10-CM

## 2021-08-03 DIAGNOSIS — R945 Abnormal results of liver function studies: Secondary | ICD-10-CM

## 2021-08-03 DIAGNOSIS — I7300 Raynaud's syndrome without gangrene: Secondary | ICD-10-CM

## 2021-08-03 DIAGNOSIS — R7989 Other specified abnormal findings of blood chemistry: Secondary | ICD-10-CM

## 2021-08-03 DIAGNOSIS — M489 Spondylopathy, unspecified: Secondary | ICD-10-CM | POA: Diagnosis not present

## 2021-08-03 DIAGNOSIS — M19049 Primary osteoarthritis, unspecified hand: Secondary | ICD-10-CM

## 2021-08-03 DIAGNOSIS — M19041 Primary osteoarthritis, right hand: Secondary | ICD-10-CM

## 2021-08-03 DIAGNOSIS — D72819 Decreased white blood cell count, unspecified: Secondary | ICD-10-CM

## 2021-08-03 DIAGNOSIS — G4733 Obstructive sleep apnea (adult) (pediatric): Secondary | ICD-10-CM

## 2021-08-03 DIAGNOSIS — M5136 Other intervertebral disc degeneration, lumbar region: Secondary | ICD-10-CM

## 2021-08-03 NOTE — Progress Notes (Signed)
Chief Complaint  Patient presents with   Annual Exam    HPI: Patient  Terri Rodriguez  75 y.o. comes in today for Preventive Health Care visit in addition to multiple medical problems. Family is concerned and told her to discuss.  Chronic ic pain  poor quality of life .   Family worried  about this.   Back and neck pain.  With some radiation at times Sherwood Gambler did back surgery since has retired no intervention on the C-spine otherwise a while back she has retired .  C spine.   Hand arthritis is becoming more deforming hand wakes at night.    Had to dro[p piano  lessons remote history of rheumatologic evaluation felt DJD and other no major intervention advised at the time.  She does have a history of her nodes. Had recent right Achilles partial rupture and was in a boot for a while uses boot when she has to be active otherwise is out.  Had episode of falling when she was at the beach feels it is secondary to her balance issues related to her multiple joint surgeries and predicament's  She is on Lyrica 50 mg 3 times a day with some help no other pain medicine.  At this time.  She has been on Cymbalta for both pain and depressive mood symptoms.  She has stopped alcohol other since her liver tests were abnormal and ultrasound showed possibly mild cirrhosis.  She is no longer taking the Celebrex. Of note in the past when on prednisone for what ever reason her joint pain became significantly better.  Feels successfully treated sleep apnea Thinks that her memory is not as good but family not complained feels it has not been right since her head injury.   Health Maintenance  Topic Date Due   Zoster Vaccines- Shingrix (1 of 2) Never done   OPHTHALMOLOGY EXAM  12/12/2018   INFLUENZA VACCINE  07/05/2021   COVID-19 Vaccine (5 - Booster for Pfizer series) 07/18/2021   TETANUS/TDAP  01/11/2024   DEXA SCAN  Completed   Hepatitis C Screening  Completed   PNA vac Low Risk Adult  Completed   HPV  VACCINES  Aged Out   Health Maintenance Review LIFESTYLE:  Exercise: Minimal because of joint problems Tobacco/ETS: No Alcohol: n Sugar beverages:n Sleep: osa. Nose pillow  Drug use: no HH of 2 sometimes help with grandchildren to Work: Retired Dr.    Leonard Downing as per HPI  Past Medical History:  Diagnosis Date   ALLERGIC RHINITIS    Anginal pain ([place])    Blood transfusion without reported diagnosis    Cataract    small right   Cholelithiasis 06/09/2008   gallstones 2009 on ct scan   Complication of anesthesia 2012   went to ICU after bilateral knees , unstable vital signs oct 2012, has surgery since did ok   Coronary artery disease    30 % narrowing lad   Diverticulitis    EPICONDYLITIS, LATERAL    osteoarthritis, previous joint replacements   Fall 10/2018   GERD    Hepatic hemangioma 06/09/08   Hiatal hernia 03/2000   Hx of adenomatous polyp of colon 06/05/08   Hx of cardiac catheterization 2008    clean coronarys DrKelly    Hx of chest pain 2003   neg cath remot hx of narrowwing lad in 2003 nl 2008, none in many years   HYPERLIPIDEMIA    IBS (irritable bowel syndrome)    LEUKOPENIA, CHRONIC  Mononucleosis 1963   RAYNAUD'S SYNDROME, HX OF    improved after cardiac meds initiated   Rosacea    facial   Sleep apnea    SLEEP APNEA, OBSTRUCTIVE    cpap, settings "automatic" settings 11-12   Subacute thyroiditis    UNSPECIFIED ANEMIA     Past Surgical History:  Procedure Laterality Date   ABDOMINAL HYSTERECTOMY  1993   bso   BACK SURGERY  2008   l 3 to l4 l4 to l5   BARTHOLIN GLAND CYST EXCISION Right 09/09/2019   Procedure: EXCISION OF VULVAR MASS TIMES TWO;  Surgeon: Megan Salon, MD;  Location: [facility];  Service: Gynecology;  Laterality: Right;   CARDIAC CATHETERIZATION  04/07/2007   Noncritical coronary artery disease. Contiue medical therapy.   CARDIAC CATHETERIZATION  12/03/2007   Normal LV function. Mild angiographic mitral valve  prolapse. Normal coronary arteries.   CARDIOVASCULAR STRESS TEST  11/09/2007   Moderate ischemia in Mid Anterior, Mid Anteroseptal, Apical Anterior, and Apical Septal regions. EKG negative for ischemia.   [place]   dental implants  11/2002,11/2011,11/2012   dental implants     x 6   DENTAL SURGERY     DILATION AND CURETTAGE OF UTERUS     d and e 1988 and 1990   JOINT REPLACEMENT  09/21/2011   bilateral knee   KNEE ARTHROSCOPY     bilateral   lumbar surgery fixation with disc replacement  10/08   Left   NASAL SEPTOPLASTY W/ TURBINOPLASTY  05/2000   NOCTURNAL POLYSOMNOGRAM  12/31/2006   Severe obstructive sleep apnea. AHI-87/hr   REPLACEMENT TOTAL KNEE  2012   bilateral   TONSILLECTOMY  1952   adenoids too   TOTAL HIP ARTHROPLASTY Left 10/21/2013   Procedure: LEFT TOTAL HIP ARTHROPLASTY ANTERIOR APPROACH;  Surgeon: [name], MD;  Location: [facility];  Service: Orthopedics;  Laterality: Left;   TOTAL HIP ARTHROPLASTY Right 07/16/2014   Procedure: RIGHT TOTAL HIP ARTHROPLASTY ANTERIOR APPROACH;  Surgeon: [name], MD;  Location: [facility];  Service: Orthopedics;  Laterality: Right;   TRANSTHORACIC ECHOCARDIOGRAM  11/09/2007   EF [id number], LV systolic function normal. Mild aortic root dilation.   WRIST SURGERY Left 04/03/14   ORIF "distal radial head fracture"    Family History  Problem Relation Age of Onset   Rheum arthritis Mother    Diabetes Mother    Heart failure Mother    Thrombocytopenia Mother    Sleep apnea Mother    Ulcers Mother        PUD   Deep vein thrombosis Father    Pulmonary embolism Father    Osteoarthritis Father    Atrial fibrillation Father    Dementia Father    Sleep apnea Father    Sleep apnea Brother    Obesity Brother    Sleep apnea Brother    Obesity Brother    Colon cancer Maternal Aunt 33   Colon cancer Maternal Aunt 90   Pancreatic cancer Other    Uterine cancer Other    Leukemia Other    Inflammatory bowel disease  Other        aunt   Congenital adrenal hyperplasia Grandchild    Atrial fibrillation Brother    Esophageal cancer Neg Hx    Rectal cancer Neg Hx    Stomach cancer Neg Hx     Social History   Socioeconomic History   Marital status: Married    Spouse  name: Not on file   Number of children: 1   Years of education: Not on file   Highest education level: Not on file  Occupational History   Occupation: Physician  Tobacco Use   Smoking status: Never   Smokeless tobacco: Never  Vaping Use   Vaping Use: Never used  Substance and Sexual Activity   Alcohol use: Yes    Alcohol/week: 14.0 standard drinks    Types: 14 Glasses of wine per week    Comment: wine 2 glasses per day   Drug use: No   Sexual activity: Yes    Partners: Male    Birth control/protection: Surgical    Comment: TAH/BSO  Other Topics Concern   Not on file  Social History Narrative   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   Social Determinants of Health   Financial Resource Strain: Low Risk    Difficulty of Paying Living Expenses: Not hard at all  Food Insecurity: No Food Insecurity   Worried About Charity fundraiser in the Last Year: Never true   [place] in the Last Year: Never true  Transportation Needs: No Transportation Needs   Lack of Transportation (Medical): No   Lack of Transportation (Non-Medical): No  Physical Activity: Inactive   Days of Exercise per Week: 0 days   Minutes of Exercise per Session: 0 min  Stress: No Stress Concern Present   Feeling of Stress : Not at all  Social Connections: Moderately Integrated   Frequency of Communication with Friends and Family: More than three times a week   Frequency of Social Gatherings with Friends and Family: More than three times a week   Attends Religious Services: 1 to 4 times per year   Active Member of [employer] or Organizations: No   Attends Archivist  Meetings: Never   Marital Status: Married    Outpatient Medications Prior to Visit  Medication Sig Dispense Refill   amLODipine (NORVASC) 5 MG tablet TAKE 1 TABLET BY MOUTH EVERY DAY 90 tablet 1   amoxicillin (AMOXIL) 500 MG capsule 2,000 mg. 1 hour prior to dental procedures.     aspirin 81 MG tablet Take 81 mg by mouth daily.     atorvastatin (LIPITOR) 20 MG tablet TAKE 1 TABLET BY MOUTH EVERY DAY AT 6PM 90 tablet 3   Azelaic Acid 15 % cream Apply 1 application topically 2 (two) times daily. After skin is thoroughly washed and patted dry, gently but thoroughly massage a thin film of azelaic acid cream into the affected area t daily,after shower.     carvedilol (COREG) 3.125 MG tablet Take 1 tablet (3.125 mg total) by mouth 2 (two) times daily with a meal. 180 tablet 1   Cholecalciferol (VITAMIN D3) 2000 UNITS TABS Take 1 tablet by mouth daily.     clindamycin (CLEOCIN T) 1 % external solution 2 (two) times daily as needed.  3   clobetasol (TEMOVATE) 0.05 % external solution Apply topically.     diclofenac sodium (VOLTAREN) 1 % GEL Apply 2-4 g topically 4 (four) times daily. As needed for  Arthritis  Pain. 4 Tube 1   DULoxetine (CYMBALTA) 60 MG capsule TAKE 1 CAPSULE (60 MG TOTAL) BY MOUTH EVERY EVENING. 90 capsule 1   estradiol (ESTRACE) 0.5 MG tablet 1/2 tab daily 45 tablet 4   fexofenadine (ALLEGRA) 180 MG tablet  Take 180 mg by mouth daily. Takes in PM     fluocinonide (LIDEX) 0.05 % external solution Apply AA on scalp PRN  0   ketoconazole (NIZORAL) 2 % cream SMARTSIG:1 Application Topical 1 to 2 Times Daily     metroNIDAZOLE (METROGEL) 0.75 % gel APPLY TO AFFECTED AREA TWICE A DAY     Multiple Vitamins-Minerals (CENTRUM ADULTS PO) Take 1 tablet by mouth daily.     nitroGLYCERIN (NITROSTAT) 0.4 MG SL tablet PLACE 1 TABLET UNDER THE TONGUE EVERY 5 MINUTES AS NEEDED FOR CHEST PAIN 25 tablet 3   omeprazole (PRILOSEC) 20 MG capsule Take 20 mg by mouth in the morning and at bedtime.      pregabalin (LYRICA) 50 MG capsule TAKE 1 CAPSULE BY MOUTH 3 TIMES DAILY 270 capsule 0   ramipril (ALTACE) 10 MG capsule TAKE 1 CAPSULE BY MOUTH EVERY DAY 90 capsule 1   valACYclovir (VALTREX) 1000 MG tablet TAKE 2 TABLET BY MOUTH Q12 hours X 2 DOSES WITH FEVER BLISTERS 30 tablet 1   acetaminophen (TYLENOL) 500 MG tablet Take 1,000 mg by mouth every 6 (six) hours as needed for mild pain or headache. (Patient not taking: Reported on 04/12/2021)     celecoxib (CELEBREX) 200 MG capsule TAKE 1 CAPSULE BY MOUTH EVERY DAY (Patient not taking: Reported on 04/12/2021) 90 capsule 1   NONFORMULARY OR COMPOUNDED ITEM Topical testosterone cream '1mg'$ /0.64m.  Apply topically 0.166mtopically two times weekly.  Disp: 3 month supply.  #0RF. (Patient not taking: Reported on 04/12/2021) 1 each 0   No facility-administered medications prior to visit.     EXAM:  BP 116/60 (BP Location: Left Arm, Patient Position: Sitting, Cuff Size: Large)   Pulse 77   Temp 98 F (36.7 C) (Oral)   Ht 5' 4.5" (1.638 m)   Wt 251 lb 3.2 oz (113.9 kg)   SpO2 95%   BMI 42.45 kg/m   Body mass index is 42.45 kg/m. Wt Readings from Last 3 Encounters:  08/03/21 251 lb 3.2 oz (113.9 kg)  01/18/21 246 lb 12.8 oz (111.9 kg)  10/21/20 246 lb 12.8 oz (111.9 kg)    Physical Exam: Vital signs reviewed [id number] a well-developed well-nourished alert cooperative    who appearsr stated age in no acute distress.  HEENT: normocephalic atraumatic , Eyes: PERRL EOM's full, conjunctiva clear, Nares: paten,t no deformity discharge or tenderness., Ears: no deformity EAC's clear TMs with normal landmarks. Mouth: masked  NECK: Mild limitation of motion lateral without masses, thyromegaly or bruits. CHEST/PULM:  Clear to auscultation and percussion breath sounds equal no wheeze , rales or rhonchi. No chest wall deformities or tenderness. Breast: normal by inspection . No dimpling, discharge, masses, tenderness or discharge . CV: PMI is nondisplaced,  S1 S2 no gallops, murmurs, rubs. Peripheral pulses are present.  No JVD seen ABDOMEN: Bowel sounds normal nontender  No guard or rebound, no hepato splenomegal no CVA tenderness.   Extremtities:  No clubbing cyanosis or edema, n deforming arthritis of the fingers some Heberden's nodes no redness or edema.  Healed scars knee NEURO:  Oriented x3, cranial nerves 3-12 appear to be intact, no obvious focal weakness,gait within normal limits SKIN: No acute rashes normal turgor, color, fading bruise left knee right back shoulder area no petechiae. PSYCH: Oriented, good eye contact,  cognition and judgment appear normal l LN: no cervical axillary inguinal adenopathy  Lab Results  Component Value Date   WBC 3.1 (L) 08/04/2021   HGB 12.4  08/04/2021   HCT 36.1 08/04/2021   PLT 194.0 08/04/2021   GLUCOSE 99 08/04/2021   CHOL 152 08/04/2021   TRIG 61.0 08/04/2021   HDL 72.70 08/04/2021   LDLCALC 67 08/04/2021   ALT 28 08/04/2021   AST 40 (H) 08/04/2021   NA 141 08/04/2021   K 4.2 08/04/2021   CL 108 08/04/2021   CREATININE 0.77 08/04/2021   BUN 17 08/04/2021   CO2 26 08/04/2021   TSH 1.58 08/04/2021   INR 1.0 04/13/2021   HGBA1C 5.7 08/04/2021    BP Readings from Last 3 Encounters:  08/03/21 116/60  01/18/21 128/60  10/21/20 122/60   Lab Results  Component Value Date   VITAMINB12 1,058 (H) 08/04/2021    Lab plan reviewed with patient   ASSESSMENT AND PLAN:  Discussed the following assessment and plan:    ICD-10-CM   1. Visit for preventive health examination  Z00.00     2. Abnormal LFTs  R94.5 Vitamin B12    Sedimentation rate    CBC with Differential/Platelet    Hemoglobin A1c    Hepatic function panel    Lipid panel    TSH    Basic metabolic panel    CANCELED: C-reactive protein    3. Other chronic pain  G89.29 Vitamin B12    Sedimentation rate    CBC with Differential/Platelet    Hemoglobin A1c    Hepatic function panel    Lipid panel    TSH    Basic metabolic  panel    Ambulatory referral to Physical Medicine Rehab    CANCELED: C-reactive protein   from degenerativ disease moely neck back and hands     4. Cervical spine disease  M48.9 Ambulatory referral to Physical Medicine Rehab    5. Hand arthritis  M19.049 Ambulatory referral to Physical Medicine Rehab    6. Medication management  Z79.899 Vitamin B12    Sedimentation rate    CBC with Differential/Platelet    Hemoglobin A1c    Hepatic function panel    Lipid panel    TSH    Basic metabolic panel    CANCELED: C-reactive protein    7. Raynaud's disease without gangrene  I73.00 Vitamin B12    Sedimentation rate    CBC with Differential/Platelet    Hemoglobin A1c    Hepatic function panel    Lipid panel    TSH    Basic metabolic panel    CANCELED: C-reactive protein    8. Primary osteoarthritis of both hands  M19.041 Vitamin B12   M19.042 Sedimentation rate    CBC with Differential/Platelet    Hemoglobin A1c    Hepatic function panel    Lipid panel    TSH    Basic metabolic panel    CANCELED: C-reactive protein    9. Hyperlipidemia LDL goal <70  E78.5 Vitamin B12    Sedimentation rate    CBC with Differential/Platelet    Hemoglobin A1c    Hepatic function panel    Lipid panel    TSH    Basic metabolic panel    CANCELED: C-reactive protein    10. Obstructive sleep apnea  G47.33 Vitamin B12    Sedimentation rate    CBC with Differential/Platelet    Hemoglobin A1c    Hepatic function panel    Lipid panel    TSH    Basic metabolic panel    CANCELED: C-reactive protein    11. Degenerative disc disease, lumbar status post fusion  M51.36 Vitamin B12    Sedimentation rate    CBC with Differential/Platelet    Hemoglobin A1c    Hepatic function panel    Lipid panel    TSH    Basic metabolic panel    Ambulatory referral to Physical Medicine Rehab    CANCELED: C-reactive protein    12. Leukopenia, unspecified type  D72.819 Vitamin B12    Sedimentation rate    CBC  with Differential/Platelet    Hemoglobin A1c    Hepatic function panel    Lipid panel    TSH    Basic metabolic panel    CANCELED: C-reactive protein     Trial of increasing Lyrica to 100 5050, 100 5000, then 100 3 times daily to see if it is helpful for pain She probably has a significant inflammatory component because of the past great response to prednisone in the past Celebrex was stopped because of potential side effect and liver.  I have asked her to use the topical anti-inflammatory for her hands 3-4 times a day for at least a week or 2 to see if there is an effect and would help her sleep. I advised we get help from physical medicine possible injections or other options  and consider reevaluation by rheumatology for other options.( Last eval was 2017) Consider  neurosurgery again for opinion but at this time not sure it would be the most helpful.  Consider change the cymbalta   uncertain if affecting LFTs  Return for fasting lab appt  I will place orders .  then fu as appropriate .  Patient Care Team: Leemon Ayala, Standley Brooking, MD as PCP - General Claiborne Billings Joyice Faster, MD (Cardiology) Megan Salon, MD as Attending Physician (Obstetrics and Gynecology) Gaynelle Arabian, MD as Attending Physician (Orthopedic Surgery) Deneise Lever, MD (Pulmonary Disease) Bo Merino, MD as Consulting Physician (Rheumatology) Jari Pigg, MD as Consulting Physician (Dermatology) Calvert Cantor, MD as Consulting Physician (Ophthalmology) Iran Planas, MD as Consulting Physician (Orthopedic Surgery) Patient Instructions  Get fasting lab appt .   I will place orders.   I suggest we do a referral to physical/  med rehab for help with  your spinal and hand arthritis pain and dysfunction.   We will increase to lyrica    to 100 mg 50 50 and then 100  50 100 and then 100  tid    over  month or more.   ( Max dose 300 tid )  I will review meds consider change  cymbalta   I dont think causing liver issue but  has been known to be a problem for acute hepatitis.  Consdier seeing rheumatology again .       Standley Brooking. Monty Spicher M.D.

## 2021-08-03 NOTE — Patient Instructions (Addendum)
Get fasting lab appt .   I will place orders.   I suggest we do a referral to physical/  med rehab for help with  your spinal and hand arthritis pain and dysfunction.   We will increase to lyrica    to 100 mg 50 50 and then 100  50 100 and then 100  tid    over  month or more.   ( Max dose 300 tid )  I will review meds consider change  cymbalta   I dont think causing liver issue but has been known to be a problem for acute hepatitis.  Consdier seeing rheumatology again .

## 2021-08-04 ENCOUNTER — Other Ambulatory Visit (INDEPENDENT_AMBULATORY_CARE_PROVIDER_SITE_OTHER): Payer: Medicare Other

## 2021-08-04 DIAGNOSIS — Z79899 Other long term (current) drug therapy: Secondary | ICD-10-CM

## 2021-08-04 DIAGNOSIS — M19041 Primary osteoarthritis, right hand: Secondary | ICD-10-CM | POA: Diagnosis not present

## 2021-08-04 DIAGNOSIS — G4733 Obstructive sleep apnea (adult) (pediatric): Secondary | ICD-10-CM

## 2021-08-04 DIAGNOSIS — R945 Abnormal results of liver function studies: Secondary | ICD-10-CM | POA: Diagnosis not present

## 2021-08-04 DIAGNOSIS — G8929 Other chronic pain: Secondary | ICD-10-CM

## 2021-08-04 DIAGNOSIS — I7300 Raynaud's syndrome without gangrene: Secondary | ICD-10-CM | POA: Diagnosis not present

## 2021-08-04 DIAGNOSIS — D72819 Decreased white blood cell count, unspecified: Secondary | ICD-10-CM

## 2021-08-04 DIAGNOSIS — R7989 Other specified abnormal findings of blood chemistry: Secondary | ICD-10-CM

## 2021-08-04 DIAGNOSIS — M5136 Other intervertebral disc degeneration, lumbar region: Secondary | ICD-10-CM

## 2021-08-04 DIAGNOSIS — E785 Hyperlipidemia, unspecified: Secondary | ICD-10-CM

## 2021-08-04 DIAGNOSIS — M19042 Primary osteoarthritis, left hand: Secondary | ICD-10-CM

## 2021-08-04 LAB — CBC WITH DIFFERENTIAL/PLATELET
Basophils Absolute: 0 10*3/uL (ref 0.0–0.1)
Basophils Relative: 0.9 % (ref 0.0–3.0)
Eosinophils Absolute: 0.1 10*3/uL (ref 0.0–0.7)
Eosinophils Relative: 4 % (ref 0.0–5.0)
HCT: 36.1 % (ref 36.0–46.0)
Hemoglobin: 12.4 g/dL (ref 12.0–15.0)
Lymphocytes Relative: 44.1 % (ref 12.0–46.0)
Lymphs Abs: 1.4 10*3/uL (ref 0.7–4.0)
MCHC: 34.5 g/dL (ref 30.0–36.0)
MCV: 91.1 fl (ref 78.0–100.0)
Monocytes Absolute: 0.3 10*3/uL (ref 0.1–1.0)
Monocytes Relative: 8.7 % (ref 3.0–12.0)
Neutro Abs: 1.3 10*3/uL — ABNORMAL LOW (ref 1.4–7.7)
Neutrophils Relative %: 42.3 % — ABNORMAL LOW (ref 43.0–77.0)
Platelets: 194 10*3/uL (ref 150.0–400.0)
RBC: 3.96 Mil/uL (ref 3.87–5.11)
RDW: 14.4 % (ref 11.5–15.5)
WBC: 3.1 10*3/uL — ABNORMAL LOW (ref 4.0–10.5)

## 2021-08-04 LAB — LIPID PANEL
Cholesterol: 152 mg/dL (ref 0–200)
HDL: 72.7 mg/dL (ref >39.00)
LDL Cholesterol: 67 mg/dL (ref 0–99)
NonHDL: 79.01
Total CHOL/HDL Ratio: 2
Triglycerides: 61 mg/dL (ref 0.0–149.0)
VLDL: 12.2 mg/dL (ref 0.0–40.0)

## 2021-08-04 LAB — HEPATIC FUNCTION PANEL
ALT: 28 U/L (ref 0–35)
AST: 40 U/L — ABNORMAL HIGH (ref 0–37)
Albumin: 4.1 g/dL (ref 3.5–5.2)
Alkaline Phosphatase: 71 U/L (ref 39–117)
Bilirubin, Direct: 0.1 mg/dL (ref 0.0–0.3)
Total Bilirubin: 0.5 mg/dL (ref 0.2–1.2)
Total Protein: 6.5 g/dL (ref 6.0–8.3)

## 2021-08-04 LAB — BASIC METABOLIC PANEL
BUN: 17 mg/dL (ref 6–23)
CO2: 26 mEq/L (ref 19–32)
Calcium: 9.5 mg/dL (ref 8.4–10.5)
Chloride: 108 mEq/L (ref 96–112)
Creatinine, Ser: 0.77 mg/dL (ref 0.40–1.20)
GFR: 74.88 mL/min (ref >60.00)
Glucose, Bld: 99 mg/dL (ref 70–99)
Potassium: 4.2 mEq/L (ref 3.5–5.1)
Sodium: 141 mEq/L (ref 135–145)

## 2021-08-04 LAB — VITAMIN B12: Vitamin B-12: 1058 pg/mL — ABNORMAL HIGH (ref 211–911)

## 2021-08-04 LAB — TSH: TSH: 1.58 u[IU]/mL (ref 0.35–5.50)

## 2021-08-04 LAB — HEMOGLOBIN A1C: Hgb A1c MFr Bld: 5.7 % (ref 4.6–6.5)

## 2021-08-04 LAB — SEDIMENTATION RATE: Sed Rate: 8 mm/hr (ref 0–30)

## 2021-08-12 ENCOUNTER — Encounter: Payer: Self-pay | Admitting: Physical Medicine & Rehabilitation

## 2021-08-14 NOTE — Progress Notes (Signed)
Terri  Rodriguez  labs are stable or better .  I reviewed  info on Cymbalta and don't think it is causing the liver abnormality in  your case  but  would  ask another opinion before stopping and trying transition to another medication ( would have to be over 3-4 week and may not make a difference) . Plan to start with GI appt  ( my kal please make referral  dx abnormal lfts)  .   You should have an upcoming initial evaluation appt with physical rehab . Let them know if you cant  keep appt.   Plan fu with me after Gi consult   and rehab assessment and plan

## 2021-09-06 ENCOUNTER — Other Ambulatory Visit: Payer: Self-pay | Admitting: Internal Medicine

## 2021-09-10 NOTE — Telephone Encounter (Signed)
Last refill of Pregabalin per controlled substance database: 07/14/21 Last OV: 08/03/21 Next OV: none scheduled

## 2021-09-13 NOTE — Telephone Encounter (Signed)
Patient called stating that she received her refill of pregabalin (LYRICA) 50 MG capsule but she says that Dr. Regis Bill had doubled her dosage of the medication so she does not have the right amount. Patient says the refill does not reflect the dosage changed that Dr. Regis Bill told her.  Please advise.

## 2021-09-14 NOTE — Telephone Encounter (Signed)
Last OV 08/03/21 note: "Trial of increasing Lyrica to 100 5050, 100 5000, then 100 3 times daily to see if it is helpful for pain. She probably has a significant inflammatory component because of the past great response to prednisone in the past."  Current prescription not reflected from this time. Will send to PCP to review upon her return to office. Pt notified of above. States she has enough medication to last until PCP reviews & writes current prescription.

## 2021-09-15 DIAGNOSIS — Z1231 Encounter for screening mammogram for malignant neoplasm of breast: Secondary | ICD-10-CM | POA: Diagnosis not present

## 2021-09-15 LAB — HM MAMMOGRAPHY

## 2021-09-16 ENCOUNTER — Encounter: Payer: Self-pay | Admitting: Internal Medicine

## 2021-09-16 ENCOUNTER — Encounter (HOSPITAL_BASED_OUTPATIENT_CLINIC_OR_DEPARTMENT_OTHER): Payer: Self-pay | Admitting: Obstetrics & Gynecology

## 2021-09-17 MED ORDER — PREGABALIN 100 MG PO CAPS: 100.0000 mg | ORAL_CAPSULE | Freq: Three times a day (TID) | ORAL | 2 refills | Status: DC

## 2021-09-17 NOTE — Addendum Note (Signed)
Addended byShanon Ace K on: 09/17/2021 12:26 PM   Modules accepted: Orders

## 2021-09-17 NOTE — Telephone Encounter (Signed)
It is not clear  in the messages what dose she is actually taking  ? 100 mg tid?    Please confirm dosage before I send in rx.

## 2021-09-17 NOTE — Progress Notes (Signed)
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  Husband is at the beach, sick with COVID but she has avoided exposure.   ROS-see HPI + = positive Constitutional:   No-   weight loss, night sweats, fevers, chills, fatigue, lassitude. HEENT:   No-  headaches, difficulty swallowing, tooth/dental problems, sore throat,       No-  sneezing, itching, ear ache, nasal congestion, post nasal drip,  CV:  No-   chest pain, orthopnea, PND, swelling in lower extremities, anasarca,                                                 dizziness, palpitations Resp: No-   shortness of breath with exertion or at rest.              No-   productive cough,  No non-productive cough,  No- coughing up of blood.              No-   change in color of mucus.  No- wheezing.   Skin: No-   rash or lesions. GI:  No-   heartburn, indigestion, abdominal pain, nausea, vomiting,  GU:  MS:  +joint pain or swelling.  Neuro-     nothing unusual Psych:  No- change in mood or affect. No depression or anxiety.  No memory loss.  OBJ- Physical Exam General- Alert, Oriented,  Affect-appropriate, Distress- none acute, +obese Skin- rash-none, lesions- none, excoriation- none Lymphadenopathy- none Head- atraumatic            Eyes- Gross vision intact, PERRLA, conjunctivae and secretions clear            Ears- Hearing, canals-normal            Nose- Clear, no-Septal dev, mucus, polyps, erosion, perforation             Throat- Mallampati III-IV , mucosa clear , drainage- none, tonsils- atrophic Neck- flexible , trachea midline, no stridor , thyroid nl, carotid no bruit Chest - symmetrical excursion , unlabored           Heart/CV- RRR , no murmur , no gallop  , no rub, nl s1 s2                           - JVD- none , edema- none, stasis changes- none, varices- none           Lung- clear to P&A, wheeze- none, cough- none , dullness-none, rub- none           Chest wall-  Abd-  Br/ Gen/ Rectal- Not  done, not indicated Extrem- cyanosis- none, clubbing, none, atrophy- none, strength- nl Neuro- grossly intact to observation

## 2021-09-17 NOTE — Addendum Note (Signed)
Addended by: Nilda Riggs on: 09/17/2021 12:21 PM   Modules accepted: Orders

## 2021-09-17 NOTE — Telephone Encounter (Signed)
Pt reported she is taking Lyrica 100 mg TID currently. Pt request rx be sent to [facility] pharmacy.

## 2021-09-20 ENCOUNTER — Ambulatory Visit: Payer: Medicare Other | Admitting: Internal Medicine

## 2021-09-20 ENCOUNTER — Other Ambulatory Visit: Payer: Self-pay

## 2021-09-20 ENCOUNTER — Encounter: Payer: Self-pay | Admitting: Internal Medicine

## 2021-09-20 DIAGNOSIS — G4733 Obstructive sleep apnea (adult) (pediatric): Secondary | ICD-10-CM

## 2021-09-20 NOTE — Patient Instructions (Signed)
We can continue CPAP auto 4-18  Glad you are doing so well- please call if we can help

## 2021-09-27 ENCOUNTER — Telehealth: Payer: Self-pay | Admitting: Internal Medicine

## 2021-09-27 ENCOUNTER — Ambulatory Visit: Payer: Medicare Other | Admitting: Gastroenterology

## 2021-09-27 ENCOUNTER — Other Ambulatory Visit: Payer: Self-pay | Admitting: Internal Medicine

## 2021-09-27 NOTE — Progress Notes (Deleted)
Referring Provider: Burnis Medin, MD Primary Care Physician:  Burnis Medin, MD   Reason for Consultation: Abnormal liver enzymes.   IMPRESSION:  History of hepatic hemangioma dating back to MRI from 2009 Chronic reflux Symptomatic internal and external hemorrhoids Personal history of colon polyps    - 6 polyps removed on colonoscopy 07/2019         - 5 tubular adenomas and sessile serrated polyps, one hyperplastic polyp    - Surveillance colonoscopy recommended in 3 years Family history of colon cancer in 2 maternal aunts   PLAN: ANA, AMA, IgG, IgM Elastography MRI   HPI: Terri Rodriguez is a 75 y.o. female referred by Dr. Regis Bill for further evaluation of abnormal liver enzymes.  She is a retired Engineer, drilling.  I met her in August 2020 at the time of her surveillance colonoscopy.  This is my first office visit with Terri Rodriguez. The history is obtained through the patient and review of her electronic health record.  She is a former patient of Dr. Nichola Sizer.  GI history is significant for reflux, symptomatic internal hemorrhoids, a family history of colon cancer, a personal history of colon polyps, 2.7cm hemangioma on liver MRI in 2009, and a history of IBS diagnosed at age 62 at [facility] when she underwent a battery of tests including an endoscopy, barium studies, and a rigid sigmoidoscopy.  As a child she was given castor oil by her mother for constipation.  She also has obstructive sleep apnea, coronary artery disease, obesity, allergic rhinitis.  Found to have abnormal liver enzymes.  AST elevated from 38-46.  ALT transiently elevated at 40.  Most recently 70.  Total bilirubin, alkaline phosphatase, albumin, and total protein have been normal.  Alpha-1 antitrypsin level normal.  Platelets 194.  INR 1.0.  TSH 1.58.  Hepatitis B surface antigen nonreactive, hep B surface antibody reactive, hep B core total antibody nonreactive, hep C antibody nonreactive DXA/1287.  Abdominal  ultrasound 02/08/2021 to evaluate abnormal liver enzymes showed cholelithiasis, diffuse hepatocellular disease with possible hepatic cirrhosis, multiple small hepatic cysts.  She stopped alcohol and these results were available.    Surveillance: Personal history of adenomatous polyps on last colonoscopy 5 years ago Multiple maternal aunts with colon cancer Personal history of colon polyps (tubular adenoma and sessile serrated adenoma in 2007, tubular adenoma 2009) Prior colonoscopies: 1995, 2001, 2007, 2009, 2011, 2015 Unable to perform a complete colonosopy as scheduled yesterday due to poor prep  - The perianal and digital rectal examinations were normal. - Three sessile polyps were found in the ascending colon. The polyps were less than 1 mm in size. These polyps were removed with a cold biopsy forceps. Resection and retrieval were complete. Estimated blood loss was minimal. - Two sessile polyps were found in the ascending colon. The polyps were 2 to 3 mm in size. These polyps were removed with a cold snare. Resection and retrieval were complete. Estimated blood loss was minimal. - A 1 mm polyp was found in the rectum. T  Past Medical History:  Diagnosis Date   ALLERGIC RHINITIS    Anginal pain ([place])    Blood transfusion without reported diagnosis    Cataract    small right   Cholelithiasis 06/09/2008   gallstones 2009 on ct scan   Complication of anesthesia 2012   went to ICU after bilateral knees , unstable vital signs oct 2012, has surgery since did ok   Coronary artery disease    30 %  narrowing lad   Diverticulitis    EPICONDYLITIS, LATERAL    osteoarthritis, previous joint replacements   Fall 10/2018   GERD    Hepatic hemangioma 06/09/08   Hiatal hernia 03/2000   Hx of adenomatous polyp of colon 06/05/08   Hx of cardiac catheterization 2008    clean coronarys DrKelly    Hx of chest pain 2003   neg cath remot hx of narrowwing lad in 2003 nl 2008, none in many years    HYPERLIPIDEMIA    IBS (irritable bowel syndrome)    LEUKOPENIA, CHRONIC    Mononucleosis 1963   RAYNAUD'S SYNDROME, HX OF    improved after cardiac meds initiated   Rosacea    facial   Sleep apnea    SLEEP APNEA, OBSTRUCTIVE    cpap, settings "automatic" settings 11-12   Subacute thyroiditis    UNSPECIFIED ANEMIA     Past Surgical History:  Procedure Laterality Date   ABDOMINAL HYSTERECTOMY  1993   bso   BACK SURGERY  2008   l 3 to l4 l4 to l5   BARTHOLIN GLAND CYST EXCISION Right 09/09/2019   Procedure: EXCISION OF VULVAR MASS TIMES TWO;  Surgeon: Megan Salon, MD;  Location: [facility];  Service: Gynecology;  Laterality: Right;   CARDIAC CATHETERIZATION  04/07/2007   Noncritical coronary artery disease. Contiue medical therapy.   CARDIAC CATHETERIZATION  12/03/2007   Normal LV function. Mild angiographic mitral valve prolapse. Normal coronary arteries.   CARDIOVASCULAR STRESS TEST  11/09/2007   Moderate ischemia in Mid Anterior, Mid Anteroseptal, Apical Anterior, and Apical Septal regions. EKG negative for ischemia.   [place]   dental implants  11/2002,11/2011,11/2012   dental implants     x 6   DENTAL SURGERY     DILATION AND CURETTAGE OF UTERUS     d and e 1988 and 1990   JOINT REPLACEMENT  09/21/2011   bilateral knee   KNEE ARTHROSCOPY     bilateral   lumbar surgery fixation with disc replacement  10/08   Left   NASAL SEPTOPLASTY W/ TURBINOPLASTY  05/2000   NOCTURNAL POLYSOMNOGRAM  12/31/2006   Severe obstructive sleep apnea. AHI-87/hr   REPLACEMENT TOTAL KNEE  2012   bilateral   TONSILLECTOMY  1952   adenoids too   TOTAL HIP ARTHROPLASTY Left 10/21/2013   Procedure: LEFT TOTAL HIP ARTHROPLASTY ANTERIOR APPROACH;  Surgeon: [name], MD;  Location: [facility];  Service: Orthopedics;  Laterality: Left;   TOTAL HIP ARTHROPLASTY Right 07/16/2014   Procedure: RIGHT TOTAL HIP ARTHROPLASTY ANTERIOR APPROACH;  Surgeon: [name], MD;  Location[facility];  Service: Orthopedics;  Laterality: Right;   TRANSTHORACIC ECHOCARDIOGRAM  11/09/2007   EF 82%, LV systolic function normal. Mild aortic root dilation.   WRIST SURGERY Left 04/03/14   ORIF "distal radial head fracture"     Current Outpatient Medications  Medication Sig Dispense Refill   amLODipine (NORVASC) 5 MG tablet TAKE 1 TABLET BY MOUTH EVERY DAY 90 tablet 1   amoxicillin (AMOXIL) 500 MG capsule 2,000 mg. 1 hour prior to dental procedures.     aspirin 81 MG tablet Take 81 mg by mouth daily.     atorvastatin (LIPITOR) 20 MG tablet TAKE 1 TABLET BY MOUTH EVERY DAY AT 6PM 90 tablet 3   Azelaic Acid 15 % cream Apply 1 application topically 2 (two) times daily. After skin is thoroughly washed and patted dry, gently but thoroughly  massage a thin film of azelaic acid cream into the affected area t daily,after shower.     carvedilol (COREG) 3.125 MG tablet Take 1 tablet (3.125 mg total) by mouth 2 (two) times daily with a meal. 180 tablet 1   Cholecalciferol (VITAMIN D3) 2000 UNITS TABS Take 1 tablet by mouth daily.     clindamycin (CLEOCIN T) 1 % external solution 2 (two) times daily as needed.  3   clobetasol (TEMOVATE) 0.05 % external solution Apply topically.     diclofenac sodium (VOLTAREN) 1 % GEL Apply 2-4 g topically 4 (four) times daily. As needed for  Arthritis  Pain. 4 Tube 1   DULoxetine (CYMBALTA) 60 MG capsule Take 1 capsule (60 mg total) by mouth every evening. 90 capsule 0   estradiol (ESTRACE) 0.5 MG tablet 1/2 tab daily (Patient taking differently: 1/2 tab every other day) 45 tablet 4   fexofenadine (ALLEGRA) 180 MG tablet Take 180 mg by mouth daily. Takes in PM     fluocinonide (LIDEX) 0.05 % external solution Apply AA on scalp PRN  0   ketoconazole (NIZORAL) 2 % cream SMARTSIG:1 Application Topical 1 to 2 Times Daily     metroNIDAZOLE (METROGEL) 0.75 % gel APPLY TO AFFECTED AREA TWICE A DAY     Multiple Vitamins-Minerals (CENTRUM ADULTS PO) Take 1  tablet by mouth daily.     nitroGLYCERIN (NITROSTAT) 0.4 MG SL tablet PLACE 1 TABLET UNDER THE TONGUE EVERY 5 MINUTES AS NEEDED FOR CHEST PAIN 25 tablet 3   omeprazole (PRILOSEC) 20 MG capsule Take 20 mg by mouth in the morning and at bedtime.     pregabalin (LYRICA) 100 MG capsule Take 1 capsule (100 mg total) by mouth 3 (three) times daily. 90 capsule 2   ramipril (ALTACE) 10 MG capsule TAKE 1 CAPSULE BY MOUTH EVERY DAY 90 capsule 1   valACYclovir (VALTREX) 1000 MG tablet TAKE 2 TABLET BY MOUTH Q12 hours X 2 DOSES WITH FEVER BLISTERS 30 tablet 1   No current facility-administered medications for this visit.    Allergies as of 09/27/2021 - Review Complete 09/20/2021  Allergen Reaction Noted   Codeine  12/02/2013   Crab [shellfish allergy] Itching and Nausea And Vomiting 10/10/2013    Family History  Problem Relation Age of Onset   Rheum arthritis Mother    Diabetes Mother    Heart failure Mother    Thrombocytopenia Mother    Sleep apnea Mother    Ulcers Mother        PUD   Deep vein thrombosis Father    Pulmonary embolism Father    Osteoarthritis Father    Atrial fibrillation Father    Dementia Father    Sleep apnea Father    Sleep apnea Brother    Obesity Brother    Sleep apnea Brother    Obesity Brother    Colon cancer Maternal Aunt 59   Colon cancer Maternal Aunt 90   Pancreatic cancer Other    Uterine cancer Other    Leukemia Other    Inflammatory bowel disease Other        aunt   Congenital adrenal hyperplasia Grandchild    Atrial fibrillation Brother    Esophageal cancer Neg Hx    Rectal cancer Neg Hx    Stomach cancer Neg Hx        Review of Systems: 12 system ROS is negative except as noted above.   Physical Exam: General:   Alert,  well-nourished, pleasant and   cooperative in NAD Head:  Normocephalic and atraumatic. Eyes:  Sclera clear, no icterus.   Conjunctiva pink. Ears:  Normal auditory acuity. Nose:  No deformity, discharge,  or  lesions. Mouth:  No deformity or lesions.   Neck:  Supple; no masses or thyromegaly. Lungs:  Clear throughout to auscultation.   No wheezes. Heart:  Regular rate and rhythm; no murmurs. Abdomen:  Soft,nontender, nondistended, normal bowel sounds, no rebound or guarding. No hepatosplenomegaly.   Rectal:  Deferred  Msk:  Symmetrical. No boney deformities LAD: No inguinal or umbilical LAD Extremities:  No clubbing or edema. Neurologic:  Alert and  oriented x4;  grossly nonfocal Skin:  Intact without significant lesions or rashes. Psych:  Alert and cooperative. Normal mood and affect.     L. , MD, MPH 09/27/2021, 1:39 PM      

## 2021-09-27 NOTE — Progress Notes (Unsigned)
Please contact the pharmacy and tell them she needs an early refill  because she lost the bottle.  And I am okaying an early refill The refill I sent in recently should have had 2 refills left on it.

## 2021-09-27 NOTE — Telephone Encounter (Signed)
Patient called because she has misplaced the bottle of pregabalin (LYRICA) 100 MG capsule and needs an early refill authorization. Patient states she does not know where the bottle is at all.    Please send to  [place], [place] - [address] Phone:  [phone]  Fax:  [phone]         Good callback number is [phone]     Please advise

## 2021-09-27 NOTE — Telephone Encounter (Signed)
Contact pharmacy authorize them to to do an early refill as she has refills on her Lyrica and lost the bottle.

## 2021-09-27 NOTE — Telephone Encounter (Signed)
I contacted the pt's pharmacy and pharmacist was given Early refill authorization.

## 2021-10-03 DIAGNOSIS — G4733 Obstructive sleep apnea (adult) (pediatric): Secondary | ICD-10-CM | POA: Diagnosis not present

## 2021-10-07 ENCOUNTER — Encounter: Payer: Self-pay | Admitting: Physical Medicine & Rehabilitation

## 2021-10-07 ENCOUNTER — Encounter: Payer: Medicare Other | Attending: Physical Medicine & Rehabilitation | Admitting: Physical Medicine & Rehabilitation

## 2021-10-07 ENCOUNTER — Other Ambulatory Visit: Payer: Self-pay

## 2021-10-07 VITALS — BP 102/63 | HR 86 | Temp 98.8°F | Ht 65.0 in | Wt 257.0 lb

## 2021-10-07 DIAGNOSIS — M961 Postlaminectomy syndrome, not elsewhere classified: Secondary | ICD-10-CM | POA: Insufficient documentation

## 2021-10-07 DIAGNOSIS — M47812 Spondylosis without myelopathy or radiculopathy, cervical region: Secondary | ICD-10-CM | POA: Diagnosis not present

## 2021-10-07 NOTE — Patient Instructions (Signed)
Increase voltaren gel to Qid  Referral to aquatic PT

## 2021-10-07 NOTE — Progress Notes (Signed)
Subjective:    Patient ID: Terri Rodriguez, female    DOB: [birth year], 75 y.o.   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 Sherwood Gambler at L3-4 and L4-5 levels. Low back pain is mainly below the waist she has no pains radiating down the lower extremities.  Bilateral hand osteoarthritis remains an issue and this impacts her ability to do arts and crafts.  She uses Voltaren gel to the hands twice a day.  In terms of neck pain she does have a history of right upper extremity radicular symptoms which have subsequently resolved.  She had conversations with Dr. Sherwood Gambler regarding possible decompression but this was never performed prior to his retirement.   Achilles tendon tear has subsequently healed  Bilateral TKR and THA surgeries as below.  Patient did well with her postoperative rehabilitation. Pain Inventory Average Pain 8 Pain Right Now 0 My pain is intermittent, dull, and aching  In the last 24 hours, has pain interfered with the following? General activity 8 Relation with others 0 Enjoyment of life 5 What TIME of day is your pain at its worst? morning  and daytime Sleep (in general) Good  Pain is worse with: walking, bending, and some activites Pain improves with: rest Relief from Meds: 5  walk with assistance use a cane how many minutes can you walk? 5 minutes ability to climb steps?  yes do you drive?  yes Do you have any goals in this area?  yes  retired I need assistance with the following:  household duties  tingling trouble walking depression loss of taste or smell  Any changes since last visit?  no, New Patient  Any changes since last visit?  no, New Patient    Family History  Problem Relation Age of Onset   Rheum arthritis  Mother    Diabetes Mother    Heart failure Mother    Thrombocytopenia Mother    Sleep apnea Mother    Ulcers Mother        PUD   Deep vein thrombosis Father    Pulmonary embolism Father    Osteoarthritis Father    Atrial fibrillation Father    Dementia Father    Sleep apnea Father    Sleep apnea Brother    Obesity Brother    Sleep apnea Brother    Obesity Brother    Colon cancer Maternal Aunt 59   Colon cancer Maternal Aunt 90   Pancreatic cancer Other    Uterine cancer Other    Leukemia Other    Inflammatory bowel disease Other        aunt   Congenital adrenal hyperplasia Grandchild    Atrial fibrillation Brother    Esophageal cancer Neg Hx    Rectal cancer Neg Hx    Stomach cancer Neg Hx    Social History   Socioeconomic History   Marital status: Married    Spouse name: Not on file   Number of children: 1   Years of education: Not on file   Highest education level: Not on file  Occupational History   Occupation: Physician  Tobacco Use   Smoking status: Never   Smokeless tobacco: Never  Vaping Use   Vaping Use: Never used  Substance and Sexual Activity   Alcohol use:  Yes    Alcohol/week: 14.0 standard drinks    Types: 14 Glasses of wine per week    Comment: wine 2 glasses per day   Drug use: No   Sexual activity: Yes    Partners: Male    Birth control/protection: Surgical    Comment: TAH/BSO  Other Topics Concern   Not on file  Social History Narrative   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   Social Determinants of Health   Financial Resource Strain: Low Risk    Difficulty of Paying Living Expenses: Not hard at all  Food Insecurity: No Food Insecurity   Worried About Charity fundraiser in the Last Year: Never true   [place] in the Last Year: Never true  Transportation Needs: No Transportation Needs   Lack of Transportation (Medical): No   Lack of Transportation  (Non-Medical): No  Physical Activity: Inactive   Days of Exercise per Week: 0 days   Minutes of Exercise per Session: 0 min  Stress: No Stress Concern Present   Feeling of Stress : Not at all  Social Connections: Moderately Integrated   Frequency of Communication with Friends and Family: More than three times a week   Frequency of Social Gatherings with Friends and Family: More than three times a week   Attends Religious Services: 1 to 4 times per year   Active Member of [employer] or Organizations: No   Attends Music therapist: Never   Marital Status: Married   Past Surgical History:  Procedure Laterality Date   [place]  2008   l 3 to l4 l4 to l5   BARTHOLIN GLAND CYST EXCISION Right 09/09/2019   Procedure: EXCISION OF VULVAR MASS TIMES TWO;  Surgeon: Megan Salon, MD;  Location: [facility];  Service: Gynecology;  Laterality: Right;   CARDIAC CATHETERIZATION  04/07/2007   Noncritical coronary artery disease. Contiue medical therapy.   CARDIAC CATHETERIZATION  12/03/2007   Normal LV function. Mild angiographic mitral valve prolapse. Normal coronary arteries.   CARDIOVASCULAR STRESS TEST  11/09/2007   Moderate ischemia in Mid Anterior, Mid Anteroseptal, Apical Anterior, and Apical Septal regions. EKG negative for ischemia.   [place]   dental implants  11/2002,11/2011,11/2012   dental implants     x 6   DENTAL SURGERY     DILATION AND CURETTAGE OF UTERUS     d and e 1988 and 1990   JOINT REPLACEMENT  09/21/2011   bilateral knee   KNEE ARTHROSCOPY     bilateral   lumbar surgery fixation with disc replacement  10/08   Left   NASAL SEPTOPLASTY W/ TURBINOPLASTY  05/2000   NOCTURNAL POLYSOMNOGRAM  12/31/2006   Severe obstructive sleep apnea. AHI-87/hr   REPLACEMENT TOTAL KNEE  2012   bilateral   TONSILLECTOMY  1952   adenoids too   TOTAL HIP ARTHROPLASTY Left 10/21/2013   Procedure: LEFT  TOTAL HIP ARTHROPLASTY ANTERIOR APPROACH;  Surgeon: [name], MD;  [facility];  Service: Orthopedics;  Laterality: Left;   TOTAL HIP ARTHROPLASTY Right 07/16/2014   Procedure: RIGHT TOTAL HIP ARTHROPLASTY ANTERIOR APPROACH;  Surgeon: [name], MD;  [facility];  Service: Orthopedics;  Laterality: Right;   TRANSTHORACIC ECHOCARDIOGRAM  11/09/2007   EF 51%, LV systolic  function normal. Mild aortic root dilation.   WRIST SURGERY Left 04/03/14   ORIF "distal radial head fracture"   Past Medical History:  Diagnosis Date   ALLERGIC RHINITIS    Anginal pain ([place])    Blood transfusion without reported diagnosis    Cataract    small right   Cholelithiasis 06/09/2008   gallstones 2009 on ct scan   Complication of anesthesia 2012   went to ICU after bilateral knees , unstable vital signs oct 2012, has surgery since did ok   Coronary artery disease    30 % narrowing lad   Diverticulitis    EPICONDYLITIS, LATERAL    osteoarthritis, previous joint replacements   Fall 10/2018   GERD    Hepatic hemangioma 06/09/08   Hiatal hernia 03/2000   Hx of adenomatous polyp of colon 06/05/08   Hx of cardiac catheterization 2008    clean coronarys DrKelly    Hx of chest pain 2003   neg cath remot hx of narrowwing lad in 2003 nl 2008, none in many years   HYPERLIPIDEMIA    IBS (irritable bowel syndrome)    LEUKOPENIA, CHRONIC    Mononucleosis 1963   RAYNAUD'S SYNDROME, HX OF    improved after cardiac meds initiated   Rosacea    facial   Sleep apnea    SLEEP APNEA, OBSTRUCTIVE    cpap, settings "automatic" settings 11-12   Subacute thyroiditis    UNSPECIFIED ANEMIA    There were no vitals taken for this visit.  Opioid Risk Score:   Fall Risk Score:  `1  Depression screen PHQ 2/9  Depression screen [facility] 2/9 04/12/2021 01/18/2021 09/16/2020 09/02/2019 02/05/2018 02/01/2017 01/29/2016  Decreased Interest 0 0 0 0 0 0 0  Down, Depressed, Hopeless 0 0 0 1 0 0 0  PHQ - 2 Score 0 0 0 1 0 0  0  Some recent data might be hidden    Review of Systems  Musculoskeletal:  Positive for arthralgias, back pain, gait problem and neck pain.  All other systems reviewed and are negative.     Objective:   Physical Exam Vitals and nursing note reviewed.  Constitutional:      Appearance: She is obese.  HENT:     Head: Normocephalic and atraumatic.  Eyes:     Extraocular Movements: Extraocular movements intact.     Conjunctiva/sclera: Conjunctivae normal.     Pupils: Pupils are equal, round, and reactive to light.  Cardiovascular:     Rate and Rhythm: Normal rate and regular rhythm.     Pulses: Normal pulses.     Heart sounds: Normal heart sounds.  Pulmonary:     Effort: Pulmonary effort is normal.     Breath sounds: Normal breath sounds.  Abdominal:     General: There is no distension.     Palpations: There is no mass.     Tenderness: There is no abdominal tenderness.  Musculoskeletal:     Cervical back: Normal range of motion.     Comments: Mildly diminished left greater than right knee flexion full extension bilaterally decreased bilateral hip internal rotation full external rotation. Positive distraction test, positive Faber's, positive thigh thrust, positive tenderness over the sacral area  Lumbar range of motion 75% flexion, 25% extension, cervical range of motion 75% flexion extension lateral bending and rotation.  Skin:    General: Skin is warm and dry.  Neurological:     General: No focal deficit present.     Mental  Status: She is alert and oriented to person, place, and time.     Cranial Nerves: No dysarthria.     Motor: Motor function is intact.     Coordination: Coordination is intact.     Gait: Gait is intact.     Comments: Motor strength is 5/5 bilateral deltoid, bicep, tricep, grip, hip flexor, knee extensor, ankle dorsiflexion plantarflexion Negative foraminal compression test bilaterally Negative SLR bilaterally Gait without evidence of toe drag or knee  instability  Psychiatric:        Mood and Affect: Mood normal.        Behavior: Behavior normal.          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  Probable sacroiliac pain secondary to #1, consider SI injection if this worsens Cervical spondylosis without radiculopathy or myelopathy, also with myofascial pain in upper trap region Fibromyalgia- cont pregabalin 100mg  TID aquatic PT, discussed importance of weight loss and exercise B hand OA increase voltaren gel to QID RTC 34mo

## 2021-10-14 ENCOUNTER — Encounter: Payer: Self-pay | Admitting: Internal Medicine

## 2021-10-14 ENCOUNTER — Telehealth (INDEPENDENT_AMBULATORY_CARE_PROVIDER_SITE_OTHER): Payer: Medicare Other | Admitting: Internal Medicine

## 2021-10-14 VITALS — Ht 65.0 in | Wt 257.0 lb

## 2021-10-14 DIAGNOSIS — J22 Unspecified acute lower respiratory infection: Secondary | ICD-10-CM

## 2021-10-14 DIAGNOSIS — J209 Acute bronchitis, unspecified: Secondary | ICD-10-CM

## 2021-10-14 MED ORDER — DOXYCYCLINE HYCLATE 100 MG PO TABS: 100.0000 mg | ORAL_TABLET | Freq: Two times a day (BID) | ORAL | 0 refills | Status: DC

## 2021-10-14 MED ORDER — HYDROCODONE BIT-HOMATROP MBR 5-1.5 MG/5ML PO SOLN: 5.0000 mL | Freq: Four times a day (QID) | ORAL | 0 refills | Status: DC | PRN

## 2021-10-14 NOTE — Progress Notes (Signed)
Virtual Visit via Video Note  I connected with  Terri Rodriguez on 10/14/21 at 10:00 AM EST by a video enabled telemedicine application and verified that I am speaking with the correct person using two identifiers. Location patient: home Location provider:home office Persons participating in the virtual visit: patient, provider  [place] national recommendations  regarding COVID 19 pandemic   video visit is advised over in office visit for this patient.  Patient aware  of the limitations of evaluation and management by telemedicine and  availability of in person appointments. and agreed to proceed.   HPI: Terri Rodriguez presents for video visit onset  about 5 days head congestion and cough  low grade temp a t best 99  no cp sob  phlegm initially clear  dry and now thick dark some brown  no hemoptysis  sob  Grandchildren had sx and then husband still coughing but getting better .  Face congestion no local pain .  Little sleep blocks from coughing .   ROS: See pertinent positives and negatives per HPI.  Past Medical History:  Diagnosis Date   ALLERGIC RHINITIS    Anginal pain ([place])    Blood transfusion without reported diagnosis    Cataract    small right   Cholelithiasis 06/09/2008   gallstones 2009 on ct scan   Complication of anesthesia 2012   went to ICU after bilateral knees , unstable vital signs oct 2012, has surgery since did ok   Coronary artery disease    30 % narrowing lad   Diverticulitis    EPICONDYLITIS, LATERAL    osteoarthritis, previous joint replacements   Fall 10/2018   GERD    Hepatic hemangioma 06/09/08   Hiatal hernia 03/2000   Hx of adenomatous polyp of colon 06/05/08   Hx of cardiac catheterization 2008    clean coronarys DrKelly    Hx of chest pain 2003   neg cath remot hx of narrowwing lad in 2003 nl 2008, none in many years   HYPERLIPIDEMIA    IBS (irritable bowel syndrome)    LEUKOPENIA, CHRONIC    Mononucleosis 1963   RAYNAUD'S SYNDROME, HX OF     improved after cardiac meds initiated   Rosacea    facial   Sleep apnea    SLEEP APNEA, OBSTRUCTIVE    cpap, settings "automatic" settings 11-12   Subacute thyroiditis    UNSPECIFIED ANEMIA     Past Surgical History:  Procedure Laterality Date   ABDOMINAL HYSTERECTOMY  1993   bso   BACK SURGERY  2008   l 3 to l4 l4 to l5   BARTHOLIN GLAND CYST EXCISION Right 09/09/2019   Procedure: EXCISION OF VULVAR MASS TIMES TWO;  Surgeon: Megan Salon, MD;  Location: [facility];  Service: Gynecology;  Laterality: Right;   CARDIAC CATHETERIZATION  04/07/2007   Noncritical coronary artery disease. Contiue medical therapy.   CARDIAC CATHETERIZATION  12/03/2007   Normal LV function. Mild angiographic mitral valve prolapse. Normal coronary arteries.   CARDIOVASCULAR STRESS TEST  11/09/2007   Moderate ischemia in Mid Anterior, Mid Anteroseptal, Apical Anterior, and Apical Septal regions. EKG negative for ischemia.   [place]   dental implants  11/2002,11/2011,11/2012   dental implants     x 6   DENTAL SURGERY     DILATION AND CURETTAGE OF UTERUS     d and e 1988 and [place]  09/21/2011   bilateral knee  KNEE ARTHROSCOPY     bilateral   lumbar surgery fixation with disc replacement  10/08   Left   NASAL SEPTOPLASTY W/ TURBINOPLASTY  05/2000   NOCTURNAL POLYSOMNOGRAM  12/31/2006   Severe obstructive sleep apnea. AHI-87/hr   REPLACEMENT TOTAL KNEE  2012   bilateral   TONSILLECTOMY  1952   adenoids too   TOTAL HIP ARTHROPLASTY Left 10/21/2013   Procedure: LEFT TOTAL HIP ARTHROPLASTY ANTERIOR APPROACH;  Surgeon: [name], MD;  Location: [facility];  Service: Orthopedics;  Laterality: Left;   TOTAL HIP ARTHROPLASTY Right 07/16/2014   Procedure: RIGHT TOTAL HIP ARTHROPLASTY ANTERIOR APPROACH;  Surgeon: [name], MD;  Location: [facility];  Service: Orthopedics;  Laterality: Right;   TRANSTHORACIC ECHOCARDIOGRAM  11/09/2007   EF 09%, LV  systolic function normal. Mild aortic root dilation.   WRIST SURGERY Left 04/03/14   ORIF "distal radial head fracture"    Family History  Problem Relation Age of Onset   Rheum arthritis Mother    Diabetes Mother    Heart failure Mother    Thrombocytopenia Mother    Sleep apnea Mother    Ulcers Mother        PUD   Deep vein thrombosis Father    Pulmonary embolism Father    Osteoarthritis Father    Atrial fibrillation Father    Dementia Father    Sleep apnea Father    Sleep apnea Brother    Obesity Brother    Sleep apnea Brother    Obesity Brother    Colon cancer Maternal Aunt 34   Colon cancer Maternal Aunt 90   Pancreatic cancer Other    Uterine cancer Other    Leukemia Other    Inflammatory bowel disease Other        aunt   Congenital adrenal hyperplasia Grandchild    Atrial fibrillation Brother    Esophageal cancer Neg Hx    Rectal cancer Neg Hx    Stomach cancer Neg Hx     Social History   Tobacco Use   Smoking status: Never   Smokeless tobacco: Never  Vaping Use   Vaping Use: Never used  Substance Use Topics   Alcohol use: Not Currently    Alcohol/week: 14.0 standard drinks    Types: 14 Glasses of wine per week    Comment: wine 2 glasses per day   Drug use: No      Current Outpatient Medications:    amLODipine (NORVASC) 5 MG tablet, TAKE 1 TABLET BY MOUTH EVERY DAY, Disp: 90 tablet, Rfl: 1   amoxicillin (AMOXIL) 500 MG capsule, 2,000 mg. 1 hour prior to dental procedures., Disp: , Rfl:    aspirin 81 MG tablet, Take 81 mg by mouth daily., Disp: , Rfl:    atorvastatin (LIPITOR) 20 MG tablet, Lipitor 20 mg tablet  Take 1 tablet every day by oral route., Disp: , Rfl:    Azelaic Acid 15 % cream, Apply 1 application topically 2 (two) times daily. After skin is thoroughly washed and patted dry, gently but thoroughly massage a thin film of azelaic acid cream into the affected area t daily,after shower., Disp: , Rfl:    carvedilol (COREG) 3.125 MG tablet, Coreg  3.125 mg tablet  Take 1 tablet twice a day by oral route., Disp: , Rfl:    chlorhexidine (PERIDEX) 0.12 % solution, SMARTSIG:By Mouth, Disp: , Rfl:    Cholecalciferol (VITAMIN D3) 2000 UNITS TABS, Take 1 tablet by mouth daily., Disp: , Rfl:  clindamycin (CLEOCIN T) 1 % external solution, 2 (two) times daily as needed., Disp: , Rfl: 3   clobetasol (TEMOVATE) 0.05 % external solution, Apply topically., Disp: , Rfl:    diclofenac sodium (VOLTAREN) 1 % GEL, Apply 2-4 g topically 4 (four) times daily. As needed for  Arthritis  Pain., Disp: 4 Tube, Rfl: 1   doxycycline (VIBRA-TABS) 100 MG tablet, Take 1 tablet (100 mg total) by mouth 2 (two) times daily. As directed, Disp: 14 tablet, Rfl: 0   DULoxetine (CYMBALTA) 60 MG capsule, Cymbalta 60 mg capsule,delayed release  Take 1 capsule every day by oral route., Disp: , Rfl:    estradiol (ESTRACE) 0.5 MG tablet, 1/2 tab daily (Patient taking differently: 1/2 tab every other day), Disp: 45 tablet, Rfl: 4   Fexofenadine HCl (ALLEGRA ALLERGY PO), Allegra Allergy, Disp: , Rfl:    fluocinonide (LIDEX) 0.05 % external solution, Apply AA on scalp PRN, Disp: , Rfl: 0   HYDROcodone bit-homatropine (HYCODAN) 5-1.5 MG/5ML syrup, Take 5 mLs by mouth every 6 (six) hours as needed for cough., Disp: 120 mL, Rfl: 0   ketoconazole (NIZORAL) 2 % cream, SMARTSIG:1 Application Topical 1 to 2 Times Daily, Disp: , Rfl:    metroNIDAZOLE (METROGEL) 0.75 % gel, APPLY TO AFFECTED AREA TWICE A DAY, Disp: , Rfl:    Multiple Vitamins-Minerals (CENTRUM ADULTS PO), Take 1 tablet by mouth daily., Disp: , Rfl:    nitroGLYCERIN (NITROSTAT) 0.4 MG SL tablet, PLACE 1 TABLET UNDER THE TONGUE EVERY 5 MINUTES AS NEEDED FOR CHEST PAIN, Disp: 25 tablet, Rfl: 3   omeprazole (PRILOSEC) 20 MG capsule, Take 20 mg by mouth in the morning and at bedtime., Disp: , Rfl:    pregabalin (LYRICA) 100 MG capsule, Take 1 capsule (100 mg total) by mouth 3 (three) times daily., Disp: 90 capsule, Rfl: 2    pregabalin (LYRICA) 50 MG capsule, Lyrica 50 mg capsule  Take 1 capsule 3 times a day by oral route., Disp: , Rfl:    ramipril (ALTACE) 10 MG capsule, TAKE 1 CAPSULE BY MOUTH EVERY DAY, Disp: 90 capsule, Rfl: 1   valACYclovir (VALTREX) 1000 MG tablet, TAKE 2 TABLET BY MOUTH Q12 hours X 2 DOSES WITH FEVER BLISTERS, Disp: 30 tablet, Rfl: 1  EXAM: BP Readings from Last 3 Encounters:  10/07/21 102/63  09/20/21 110/60  08/03/21 116/60    VITALS per patient if applicable:  GENERAL: alert, oriented, appears well and in no acute distress non toxic  mid ill  resp  hoarse but no stridor    cough deep bronchial sounding   HEENT: atraumatic, conjunttiva clear, no obvious abnormalities on inspection of external nose and ears congested no facial  edema   NECK: normal movements of the head and neck for baseline   LUNGS: on inspection no signs of respiratory distress, breathing rate appears normal, no obvious gross SOB, gasping or wheezing  CV: no obvious cyanosi  PSYCH/NEURO: pleasant and cooperative, no obvious depression or anxiety, speech and thought processing grossly intact   ASSESSMENT AND PLAN:  Discussed the following assessment and plan:    ICD-10-CM   1. Acute respiratory infection  J22     2. Acute bronchitis, unspecified organism  J20.9      Seemingly viral by context and course  although if  not improving in next 48 house can add antibiotic   ( doxycycline sent in case for weekend) comfort for cough  support fluids etc . Counseled.   Expectant management and discussion  of plan and treatment with opportunity to ask questions and all were answered. The patient agreed with the plan and demonstrated an understanding of the instructions. Advised to call back or seek an in-person evaluation if worsening  or having  further concerns  in interim. Return if symptoms worsen or fail to improve as expected.    Shanon Ace, MD

## 2021-10-18 ENCOUNTER — Other Ambulatory Visit: Payer: Self-pay | Admitting: Cardiovascular Disease

## 2021-10-20 ENCOUNTER — Other Ambulatory Visit: Payer: Self-pay

## 2021-10-20 ENCOUNTER — Ambulatory Visit: Payer: Medicare Other | Attending: Physical Medicine & Rehabilitation

## 2021-10-20 DIAGNOSIS — M47812 Spondylosis without myelopathy or radiculopathy, cervical region: Secondary | ICD-10-CM | POA: Diagnosis not present

## 2021-10-20 DIAGNOSIS — M961 Postlaminectomy syndrome, not elsewhere classified: Secondary | ICD-10-CM | POA: Diagnosis not present

## 2021-10-20 DIAGNOSIS — R262 Difficulty in walking, not elsewhere classified: Secondary | ICD-10-CM | POA: Insufficient documentation

## 2021-10-20 DIAGNOSIS — M545 Low back pain, unspecified: Secondary | ICD-10-CM | POA: Insufficient documentation

## 2021-10-20 DIAGNOSIS — Z9181 History of falling: Secondary | ICD-10-CM | POA: Insufficient documentation

## 2021-10-20 DIAGNOSIS — M6281 Muscle weakness (generalized): Secondary | ICD-10-CM | POA: Diagnosis not present

## 2021-10-20 NOTE — Therapy (Signed)
[facility] @ [place] [place], [place], [address] Phone: [phone]   Fax:  [phone]  Physical Therapy Evaluation  Patient Details  Name: Terri Rodriguez MRN: [id number] Date of Birth: [birth year] Referring Provider (PT): Dr. Alysia Penna   Encounter Date: 10/20/2021   PT End of Session - 10/20/21 1633     Visit Number 1    Date for PT Re-Evaluation 12/17/21    Authorization Type BCBS Medicare    PT Start Time 4287    PT Stop Time 1450    PT Time Calculation (min) 48 min    Activity Tolerance Patient tolerated treatment well    Behavior During Therapy [facility] for tasks assessed/performed             Past Medical History:  Diagnosis Date   ALLERGIC RHINITIS    Anginal pain ([place])    Blood transfusion without reported diagnosis    Cataract    small right   Cholelithiasis 06/09/2008   gallstones 2009 on ct scan   Complication of anesthesia 2012   went to ICU after bilateral knees , unstable vital signs oct 2012, has surgery since did ok   Coronary artery disease    30 % narrowing lad   Diverticulitis    EPICONDYLITIS, LATERAL    osteoarthritis, previous joint replacements   Fall 10/2018   GERD    Hepatic hemangioma 06/09/08   Hiatal hernia 03/2000   Hx of adenomatous polyp of colon 06/05/08   Hx of cardiac catheterization 2008    clean coronarys DrKelly    Hx of chest pain 2003   neg cath remot hx of narrowwing lad in 2003 nl 2008, none in many years   HYPERLIPIDEMIA    IBS (irritable bowel syndrome)    LEUKOPENIA, CHRONIC    Mononucleosis 1963   RAYNAUD'S SYNDROME, HX OF    improved after cardiac meds initiated   Rosacea    facial   Sleep apnea    SLEEP APNEA, OBSTRUCTIVE    cpap, settings "automatic" settings 11-12   Subacute thyroiditis    UNSPECIFIED ANEMIA     Past Surgical History:  Procedure Laterality Date   ABDOMINAL HYSTERECTOMY  1993   bso   BACK SURGERY  2008   l 3 to l4 l4 to  l5   BARTHOLIN GLAND CYST EXCISION Right 09/09/2019   Procedure: EXCISION OF VULVAR MASS TIMES TWO;  Surgeon: Megan Salon, MD;  Location: [facility];  Service: Gynecology;  Laterality: Right;   CARDIAC CATHETERIZATION  04/07/2007   Noncritical coronary artery disease. Contiue medical therapy.   CARDIAC CATHETERIZATION  12/03/2007   Normal LV function. Mild angiographic mitral valve prolapse. Normal coronary arteries.   CARDIOVASCULAR STRESS TEST  11/09/2007   Moderate ischemia in Mid Anterior, Mid Anteroseptal, Apical Anterior, and Apical Septal regions. EKG negative for ischemia.   [place]   dental implants  11/2002,11/2011,11/2012   dental implants     x 6   DENTAL SURGERY     DILATION AND CURETTAGE OF UTERUS     d and e 1988 and 1990   JOINT REPLACEMENT  09/21/2011   bilateral knee   KNEE ARTHROSCOPY     bilateral   lumbar surgery fixation with disc replacement  10/08   Left   NASAL SEPTOPLASTY W/ TURBINOPLASTY  05/2000   NOCTURNAL POLYSOMNOGRAM  12/31/2006   Severe obstructive sleep apnea. AHI-87/hr   REPLACEMENT TOTAL  KNEE  2012   bilateral   TONSILLECTOMY  1952   adenoids too   TOTAL HIP ARTHROPLASTY Left 10/21/2013   Procedure: LEFT TOTAL HIP ARTHROPLASTY ANTERIOR APPROACH;  Surgeon: [name], MD;  Location: [facility];  Service: Orthopedics;  Laterality: Left;   TOTAL HIP ARTHROPLASTY Right 07/16/2014   Procedure: RIGHT TOTAL HIP ARTHROPLASTY ANTERIOR APPROACH;  Surgeon: [name], MD;  Location: [facility];  Service: Orthopedics;  Laterality: Right;   TRANSTHORACIC ECHOCARDIOGRAM  11/09/2007   EF 81%, LV systolic function normal. Mild aortic root dilation.   WRIST SURGERY Left 04/03/14   ORIF "distal radial head fracture"    There were no vitals filed for this visit.    Subjective Assessment - 10/20/21 1413     Subjective Patient arrives wih SPC and antalgic gait.  She begins to go over her medical history and is very detailed  and has multiple orthopedic and medical issues.  She is a retired internal medicine MD.  She stopped practicing due to family issues and needed to care for her daughter.  She admits she sits a lot and is not very active.  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  She can't recall what it was.  "I hit my head a few years ago and I struggle with my memory".  "I was really improving until a couple of years ago.  I started a walking program and maybe I wasnt ready for this.".  She has not resumed this since that time. She goes on to discuss possible liver issues and cirrhosis along with hand issues and  fibromyalgia.  She has a lengthy surgical and medical history in her chart.  Her low back pain has been present since her surgery in 2008.  She tried aquatic exercise with a friend but there was too much jumping and it hurt her knees.  She hopes to gain some relief of her back pain andbe able to walk and be active with her husband and grandchildren.    Pertinent History Long history of medical and surgical diagnoses.    Limitations Sitting;Lifting;Standing;Walking    How long can you sit comfortably? 15 min    How long can you stand comfortably? 5 min    How long can you walk comfortably? 10 min    Diagnostic tests none recent    Patient Stated Goals I would like "not to be angry" and to be able to do more standing and walking with my family.    Currently in Pain? Yes    Pain Score 5     Pain Location Other (Comment)   All over   Pain Orientation Other (Comment)   All over   Pain Descriptors / Indicators Aching    Pain Type Chronic pain    Pain Onset More than a month ago    Pain Frequency Several days a week    Aggravating Factors  varies, unable to use multiple meds due to possible liver function issues.    Pain Relieving Factors heat, ice    Effect of Pain on Daily Activities She has become fairly sedentary due to her medical issues.    Multiple Pain Sites No                [facility] PT Assessment -  10/20/21 0001       Assessment   Medical Diagnosis Lumbar post laminectomy syndrom    Referring Provider (PT) Dr. Alysia Penna    Onset Date/Surgical Date 10/05/20  Hand Dominance Right    Next MD Visit not scheduled    Prior Therapy yes      Precautions   Precautions Fall      Balance Screen   Has the patient fallen in the past 6 months No    Has the patient had a decrease in activity level because of a fear of falling?  Yes    Is the patient reluctant to leave their home because of a fear of falling?  Yes      [place] Private residence    Living Arrangements Spouse/significant other    Available Help at Discharge Family    Type of [place] to enter    Entrance Stairs-Number of Steps 2    [place] Two level    [place] - single point      Prior Function   Level of Independence Independent with household mobility with device;Independent with community mobility with device    Vocation On disability      Sensation   Light Touch Appears Intact      Functional Tests   Functional tests Sit to Stand      Sit to Stand   Comments patient able to do sit to stand from chair without UE support      Posture/Postural Control   Posture/Postural Control Postural limitations    Postural Limitations Flexed trunk      ROM / Strength   AROM / PROM / Strength AROM;Strength      AROM   Overall AROM  Within functional limits for tasks performed      Strength   Overall Strength Deficits   MMT's somewhat inconclusive due to pain but generally 4/5 bilateral LE's.     Flexibility   Soft Tissue Assessment /Muscle Length yes    Hamstrings Limited to approx 50 degrees bilaterally      Ambulation/Gait   Ambulation/Gait Yes    Gait Pattern Step-to pattern;Decreased arm swing - right;Decreased arm swing - left;Decreased stance time - right;Decreased stride length;Antalgic                         Objective measurements completed on examination: See above findings.                PT Education - 10/20/21 1632     Education Details Discussed benefits of aquatic therapy for her condition to offload her spine and joints during exercise.  Initiated HEP.    Person(s) Educated Patient    Methods Explanation;Demonstration;Verbal cues;Handout    Comprehension Verbalized understanding;Returned demonstration              PT Short Term Goals - 10/20/21 1701       PT SHORT TERM GOAL #1   Title Independence with initial HEP    Time 4    Period Weeks    Status New    Target Date 11/17/21      PT SHORT TERM GOAL #2   Title Patient to be able to tolerate therapy without exacerbation of other orthopedic issues.    Time 4    Period Weeks    Status New    Target Date 11/17/21               PT Long Term Goals - 10/20/21 1702       PT LONG TERM GOAL #  1   Title Patient to be indpendent with advanced HEP    Time 8    Period Weeks    Status New    Target Date 12/15/21      PT LONG TERM GOAL #2   Title Patient to be able to ambulate for 30 minutes with pain no greater than 2/10    Time 8    Period Weeks    Status New    Target Date 12/15/21      PT LONG TERM GOAL #3   Title Patient to be able to ascend and descend stairs with pain no greater than 2/10    Time 8    Period Weeks    Status New    Target Date 12/15/21      PT LONG TERM GOAL #4   Title Patient to be able to begin outpatient and continue aquatics program once discharged for ongoing pain control and strengthening program.    Time 8    Period Weeks    Status New    Target Date 12/15/21                    Plan - 10/20/21 1450     Clinical Impression Statement Patient is a 75 y.o. retired Internal Medicine doctor who began having orthopedic issues around 2008.  She had lumbar laminectomy in 2008, subsequently over the next several years she had bil  knee replacements,  hip replacements and has multiple medical issues.  She talks about hand pain, memory issues, liver issues, fibromyalgia and unable to take a fair amount of medication due to her liver issues that she explains may be onset of cirrhosis.  She presents with minimal strength deficits but pain with MMT's interfere with effective testing.  She has significant tightness bilateral hamstrings.  Sensation is intact and no reported radicular symptoms.  Lumbar ROM limited by pain.  She has antalgic step to gait with wide base of support.  She uses a cane and is a fall risk. Prognosis is fair based on her medical history and chronic pain issues.  She would benefit from aquatic therapy due to her multiple joint issues and low back degenerative changes.  We will initiate treatment on land and transition to aquatics when available.    Personal Factors and Comorbidities Age;Behavior Pattern;Comorbidity 1;Comorbidity 2;Comorbidity 3+;Time since onset of injury/illness/exacerbation    Comorbidities Fibromyalgia, liver dysfunction, multiple joint replacements    Examination-Activity Limitations Dressing;Transfers;Bend;Lift;Squat;Stairs    Examination-Participation Restrictions Laundry;Cleaning;Community Activity    Stability/Clinical Decision Making Evolving/Moderate complexity    Clinical Decision Making Moderate    Rehab Potential Fair    PT Frequency 2x / week    PT Duration 8 weeks    PT Treatment/Interventions ADLs/Self Care Home Management;Aquatic Therapy;Biofeedback;Canalith Repostioning;Traction;Moist Heat;Iontophoresis 4mg /ml Dexamethasone;Electrical Stimulation;Cryotherapy;Ultrasound;DME Instruction;Gait training;Therapeutic exercise;Therapeutic activities;Functional mobility training;Stair training;Balance training;Neuromuscular re-education;Patient/family education;Manual techniques;Passive range of motion;Vestibular;Vasopneumatic Device;Taping;Energy conservation;Dry needling;Spinal  Manipulations;Joint Manipulations    PT Next Visit Plan Review HEP,  begin comprehensive flexibility exercises and core strengthening.    PT Home Exercise Plan Access Code: [email or web address]  URL: [email or web address]  Date: 10/20/2021  Prepared by: Candyce Churn    Exercises  Standing Hamstring Stretch with Step - 2 x daily - 7 x weekly - 3 reps - 30 sec hold  Quadricep Stretch with Chair and Counter Support - 2 x daily - 7 x weekly - 1 sets - 3 reps - 30 sec hold    Consulted and Agree with Plan of  Care Patient             Patient will benefit from skilled therapeutic intervention in order to improve the following deficits and impairments:  Abnormal gait, Decreased balance, Decreased endurance, Decreased mobility, Difficulty walking, Hypomobility, Increased muscle spasms, Obesity, Decreased activity tolerance, Decreased strength, Increased fascial restricitons, Pain  Visit Diagnosis: Lumbar post-laminectomy syndrome - Plan: PT plan of care cert/re-cert  Muscle weakness (generalized) - Plan: PT plan of care cert/re-cert  Pain in left lumbar region of back - Plan: PT plan of care cert/re-cert  History of falling - Plan: PT plan of care cert/re-cert  Difficulty in walking, not elsewhere classified - Plan: PT plan of care cert/re-cert    [facility] PT Assessment - 10/20/21 0001       Assessment   Medical Diagnosis Lumbar post laminectomy syndrom    Referring Provider (PT) Dr. Alysia Penna    Onset Date/Surgical Date 10/05/20    Hand Dominance Right    Next MD Visit not scheduled    Prior Therapy yes      Precautions   Precautions Fall      Balance Screen   Has the patient fallen in the past 6 months No    Has the patient had a decrease in activity level because of a fear of falling?  Yes    Is the patient reluctant to leave their home because of a fear of falling?  Yes      [place] Private residence    Living Arrangements  Spouse/significant other    Available Help at Discharge Family    Type of [place] to enter    Entrance Stairs-Number of Steps 2    [place] Two level    [place] - single point      Prior Function   Level of Independence Independent with household mobility with device;Independent with community mobility with device    Vocation On disability      Sensation   Light Touch Appears Intact      Functional Tests   Functional tests Sit to Stand      Sit to Stand   Comments patient able to do sit to stand from chair without UE support      Posture/Postural Control   Posture/Postural Control Postural limitations    Postural Limitations Flexed trunk      ROM / Strength   AROM / PROM / Strength AROM;Strength      AROM   Overall AROM  Within functional limits for tasks performed      Strength   Overall Strength Deficits   MMT's somewhat inconclusive due to pain but generally 4/5 bilateral LE's.     Flexibility   Soft Tissue Assessment /Muscle Length yes    Hamstrings Limited to approx 50 degrees bilaterally      Ambulation/Gait   Ambulation/Gait Yes    Gait Pattern Step-to pattern;Decreased arm swing - right;Decreased arm swing - left;Decreased stance time - right;Decreased stride length;Antalgic               Problem List Patient Active Problem List   Diagnosis Date Noted   Lumbar post-laminectomy syndrome 10/07/2021   Cervical spondylosis without myelopathy 10/07/2021   Primary osteoarthritis of both feet 06/18/2017   Status post bilateral total hip replacement 06/18/2017   Status post total knee replacement, bilateral 06/18/2017   Degenerative disc disease, lumbar  status post fusion 06/18/2017   Degenerative disc disease, cervical 06/18/2017   Raynaud's disease without gangrene 06/18/2017   History of cholelithiasis 06/18/2017   Other chronic pain 02/01/2017   Visit for preventive health  examination 01/21/2015   Medicare annual wellness visit, subsequent 01/21/2015   BMI 40.0-44.9, adult ([place]) 01/21/2015   Hyperlipidemia LDL goal <70 01/12/2015   Morbid obesity ([place]) 01/12/2015   Hyperglycemia 01/10/2014   Mild CAD 01/06/2014   Medication monitoring encounter 09/22/2012   Hx of cardiac catheterization    Postmenopausal HRT (hormone replacement therapy) 09/20/2012   Adenomatous polyp of colon 06/14/2012   ROSACEA 04/26/2010   Obstructive sleep apnea 09/09/2009   LEUKOPENIA, CHRONIC 01/08/2008   Elevated lipids 08/17/2007   ALLERGIC RHINITIS 08/17/2007   GERD 08/17/2007   Primary osteoarthritis of both hands 08/17/2007    Anderson Malta B. Sabas Frett, PT 11/16/225:08 PM   [place] @ [place], [place], [address] Phone: [phone]   Fax:  [phone]  Name: CORNESHA RADZIEWICZ MRN: [id number] Date of Birth: Dec 02, 1946

## 2021-10-20 NOTE — Patient Instructions (Signed)
Initiated HEP Access Code: [email or web address] URL: [email or web address] Date: 10/20/2021 Prepared by: Candyce Churn  Exercises Standing Hamstring Stretch with Step - 2 x daily - 7 x weekly - 3 reps - 30 sec hold Quadricep Stretch with Chair and Counter Support - 2 x daily - 7 x weekly - 1 sets - 3 reps - 30 sec hold

## 2021-10-22 ENCOUNTER — Ambulatory Visit: Payer: Medicare Other | Admitting: Gastroenterology

## 2021-11-01 ENCOUNTER — Ambulatory Visit: Payer: Medicare Other | Admitting: Physical Therapy

## 2021-11-01 ENCOUNTER — Other Ambulatory Visit: Payer: Self-pay

## 2021-11-01 DIAGNOSIS — M545 Low back pain, unspecified: Secondary | ICD-10-CM | POA: Diagnosis not present

## 2021-11-01 DIAGNOSIS — R262 Difficulty in walking, not elsewhere classified: Secondary | ICD-10-CM | POA: Diagnosis not present

## 2021-11-01 DIAGNOSIS — M6281 Muscle weakness (generalized): Secondary | ICD-10-CM | POA: Diagnosis not present

## 2021-11-01 DIAGNOSIS — Z9181 History of falling: Secondary | ICD-10-CM

## 2021-11-01 DIAGNOSIS — M961 Postlaminectomy syndrome, not elsewhere classified: Secondary | ICD-10-CM | POA: Diagnosis not present

## 2021-11-01 DIAGNOSIS — M47812 Spondylosis without myelopathy or radiculopathy, cervical region: Secondary | ICD-10-CM | POA: Diagnosis not present

## 2021-11-01 NOTE — Therapy (Signed)
[facility] [facility] @ [facility] [place], [place], [address] Phone: [phone]   Fax:  [phone]  Physical Therapy Treatment  Patient Details  Name: Terri Rodriguez MRN: [id number] Date of Birth: 28-Feb-1946 Referring Provider (PT): Dr. Alysia Penna   Encounter Date: 11/01/2021   PT End of Session - 11/01/21 1235     Visit Number 2    Date for PT Re-Evaluation 12/17/21    Authorization Type BCBS Medicare    PT Start Time 5361    PT Stop Time 1310    PT Time Calculation (min) 40 min    Activity Tolerance Patient tolerated treatment well    Behavior During Therapy [facility] for tasks assessed/performed             Past Medical History:  Diagnosis Date   ALLERGIC RHINITIS    Anginal pain ([place])    Blood transfusion without reported diagnosis    Cataract    small right   Cholelithiasis 06/09/2008   gallstones 2009 on ct scan   Complication of anesthesia 2012   went to ICU after bilateral knees , unstable vital signs oct 2012, has surgery since did ok   Coronary artery disease    30 % narrowing lad   Diverticulitis    EPICONDYLITIS, LATERAL    osteoarthritis, previous joint replacements   Fall 10/2018   GERD    Hepatic hemangioma 06/09/08   Hiatal hernia 03/2000   Hx of adenomatous polyp of colon 06/05/08   Hx of cardiac catheterization 2008    clean coronarys DrKelly    Hx of chest pain 2003   neg cath remot hx of narrowwing lad in 2003 nl 2008, none in many years   HYPERLIPIDEMIA    IBS (irritable bowel syndrome)    LEUKOPENIA, CHRONIC    Mononucleosis 1963   RAYNAUD'S SYNDROME, HX OF    improved after cardiac meds initiated   Rosacea    facial   Sleep apnea    SLEEP APNEA, OBSTRUCTIVE    cpap, settings "automatic" settings 11-12   Subacute thyroiditis    UNSPECIFIED ANEMIA     Past Surgical History:  Procedure Laterality Date   ABDOMINAL HYSTERECTOMY  1993   bso   BACK SURGERY  2008   l 3 to l4 l4 to l5    BARTHOLIN GLAND CYST EXCISION Right 09/09/2019   Procedure: EXCISION OF VULVAR MASS TIMES TWO;  Surgeon: Megan Salon, MD;  Location: [place];  Service: Gynecology;  Laterality: Right;   CARDIAC CATHETERIZATION  04/07/2007   Noncritical coronary artery disease. Contiue medical therapy.   CARDIAC CATHETERIZATION  12/03/2007   Normal LV function. Mild angiographic mitral valve prolapse. Normal coronary arteries.   CARDIOVASCULAR STRESS TEST  11/09/2007   Moderate ischemia in Mid Anterior, Mid Anteroseptal, Apical Anterior, and Apical Septal regions. EKG negative for ischemia.   [place]   dental implants  11/2002,11/2011,11/2012   dental implants     x 6   DENTAL SURGERY     DILATION AND CURETTAGE OF UTERUS     d and e 1988 and 1990   JOINT REPLACEMENT  09/21/2011   bilateral knee   KNEE ARTHROSCOPY     bilateral   lumbar surgery fixation with disc replacement  10/08   Left   NASAL SEPTOPLASTY W/ TURBINOPLASTY  05/2000   NOCTURNAL POLYSOMNOGRAM  12/31/2006   Severe obstructive sleep apnea. AHI-87/hr   REPLACEMENT TOTAL  KNEE  2012   bilateral   TONSILLECTOMY  1952   adenoids too   TOTAL HIP ARTHROPLASTY Left 10/21/2013   Procedure: LEFT TOTAL HIP ARTHROPLASTY ANTERIOR APPROACH;  Surgeon: [name], MD;  Location[facility];  Service: Orthopedics;  Laterality: Left;   TOTAL HIP ARTHROPLASTY Right 07/16/2014   Procedure: RIGHT TOTAL HIP ARTHROPLASTY ANTERIOR APPROACH;  Surgeon: [name], MD;  Location: [facility];  Service: Orthopedics;  Laterality: Right;   TRANSTHORACIC ECHOCARDIOGRAM  11/09/2007   EF 16%, LV systolic function normal. Mild aortic root dilation.   WRIST SURGERY Left 04/03/14   ORIF "distal radial head fracture"    There were no vitals filed for this visit.                      [facility] Adult PT Treatment/Exercise - 11/01/21 0001       Lumbar Exercises: Stretches   Active Hamstring Stretch Left;Right;3  reps;20 seconds    Active Hamstring Stretch Limitations Pt prefers seated: easier for her to do: will do seated at home      Lumbar Exercises: Aerobic   Nustep L1 4 min to end session with PTA present to montior      Knee/Hip Exercises: Public affairs consultant --   Standing marching 10x , pt could not do foot of chair: pt will start will butt kicks at home since she was successful with this     Knee/Hip Exercises: Seated   Sit to Sand 5 reps;without UE support   Added to HEP                      PT Short Term Goals - 10/20/21 1701       PT SHORT TERM GOAL #1   Title Independence with initial HEP    Time 4    Period Weeks    Status New    Target Date 11/17/21      PT SHORT TERM GOAL #2   Title Patient to be able to tolerate therapy without exacerbation of other orthopedic issues.    Time 4    Period Weeks    Status New    Target Date 11/17/21               PT Long Term Goals - 10/20/21 1702       PT LONG TERM GOAL #1   Title Patient to be indpendent with advanced HEP    Time 8    Period Weeks    Status New    Target Date 12/15/21      PT LONG TERM GOAL #2   Title Patient to be able to ambulate for 30 minutes with pain no greater than 2/10    Time 8    Period Weeks    Status New    Target Date 12/15/21      PT LONG TERM GOAL #3   Title Patient to be able to ascend and descend stairs with pain no greater than 2/10    Time 8    Period Weeks    Status New    Target Date 12/15/21      PT LONG TERM GOAL #4   Title Patient to be able to begin outpatient and continue aquatics program once discharged for ongoing pain control and strengthening program.    Time 8    Period Weeks    Status New    Target Date 12/15/21  PT LONG TERM GOAL #5   Title FOTO to be 51    Time 8    Period Weeks    Status New    Target Date 12/21/21                   Plan - 11/01/21 1235     Clinical Impression Statement Pt arrives with concerns her  initial HEp is not easy for her to do and was curious if she could do the same exercises but in different ways. PTA educated pt in how to perform her hamstring and quad stretching in slightly different positions. Pt could correctly demonstrate and verbally reports they were easier to do and therefore she would more than likely do them more consistently. PTA also added sit to stand to HEP.    Personal Factors and Comorbidities Age;Behavior Pattern;Comorbidity 1;Comorbidity 2;Comorbidity 3+;Time since onset of injury/illness/exacerbation    Comorbidities Fibromyalgia, liver dysfunction, multiple joint replacements    Examination-Activity Limitations Dressing;Transfers;Bend;Lift;Squat;Stairs    Examination-Participation Restrictions Laundry;Cleaning;Community Activity    Stability/Clinical Decision Making Evolving/Moderate complexity    Rehab Potential Fair    PT Frequency 2x / week    PT Duration 8 weeks    PT Treatment/Interventions ADLs/Self Care Home Management;Aquatic Therapy;Biofeedback;Canalith Repostioning;Traction;Moist Heat;Iontophoresis 4mg /ml Dexamethasone;Electrical Stimulation;Cryotherapy;Ultrasound;DME Instruction;Gait training;Therapeutic exercise;Therapeutic activities;Functional mobility training;Stair training;Balance training;Neuromuscular re-education;Patient/family education;Manual techniques;Passive range of motion;Vestibular;Vasopneumatic Device;Taping;Energy conservation;Dry needling;Spinal Manipulations;Joint Manipulations    PT Next Visit Plan Review HEP and pt also begins aquatic PT soon.    PT Home Exercise Plan Access Code: [email or web address]  URL: [email or web address]  Date: 10/20/2021  Prepared by: Candyce Churn    Exercises  Standing Hamstring Stretch with Step - 2 x daily - 7 x weekly - 3 reps - 30 sec hold  Quadricep Stretch with Chair and Counter Support - 2 x daily - 7 x weekly - 1 sets - 3 reps - 30 sec hold    Consulted and Agree with Plan of Care Patient              Patient will benefit from skilled therapeutic intervention in order to improve the following deficits and impairments:  Abnormal gait, Decreased balance, Decreased endurance, Decreased mobility, Difficulty walking, Hypomobility, Increased muscle spasms, Obesity, Decreased activity tolerance, Decreased strength, Increased fascial restricitons, Pain  Visit Diagnosis: Lumbar post-laminectomy syndrome  Muscle weakness (generalized)  Pain in left lumbar region of back  History of falling  Difficulty in walking, not elsewhere classified     Problem List Patient Active Problem List   Diagnosis Date Noted   Lumbar post-laminectomy syndrome 10/07/2021   Cervical spondylosis without myelopathy 10/07/2021   Primary osteoarthritis of both feet 06/18/2017   Status post bilateral total hip replacement 06/18/2017   Status post total knee replacement, bilateral 06/18/2017   Degenerative disc disease, lumbar status post fusion 06/18/2017   Degenerative disc disease, cervical 06/18/2017   Raynaud's disease without gangrene 06/18/2017   History of cholelithiasis 06/18/2017   Other chronic pain 02/01/2017   Visit for preventive health examination 01/21/2015   Medicare annual wellness visit, subsequent 01/21/2015   BMI 40.0-44.9, adult ([place]) 01/21/2015   Hyperlipidemia LDL goal <70 01/12/2015   Morbid obesity ([place]) 01/12/2015   Hyperglycemia 01/10/2014   Mild CAD 01/06/2014   Medication monitoring encounter 09/22/2012   Hx of cardiac catheterization    Postmenopausal HRT (hormone replacement therapy) 09/20/2012   Adenomatous polyp of colon 06/14/2012   ROSACEA 04/26/2010   Obstructive sleep apnea 09/09/2009   LEUKOPENIA, CHRONIC 01/08/2008  Elevated lipids 08/17/2007   ALLERGIC RHINITIS 08/17/2007   GERD 08/17/2007   Primary osteoarthritis of both hands 08/17/2007    Shawndell Varas, PTA 11/01/2021, 2:00 PM  [facility] @  [place], [place], [address] Phone: [phone]   Fax:  [phone]  Name: Terri Rodriguez MRN: [id number] Date of Birth: November 06, 1946

## 2021-11-09 ENCOUNTER — Other Ambulatory Visit: Payer: Self-pay

## 2021-11-09 MED ORDER — OSELTAMIVIR PHOSPHATE 75 MG PO CAPS: 75.0000 mg | ORAL_CAPSULE | Freq: Two times a day (BID) | ORAL | 0 refills | Status: DC

## 2021-11-11 ENCOUNTER — Ambulatory Visit (HOSPITAL_BASED_OUTPATIENT_CLINIC_OR_DEPARTMENT_OTHER): Payer: Medicare Other | Attending: Physical Medicine & Rehabilitation | Admitting: Physical Therapy

## 2021-11-11 ENCOUNTER — Other Ambulatory Visit: Payer: Self-pay

## 2021-11-11 ENCOUNTER — Encounter (HOSPITAL_BASED_OUTPATIENT_CLINIC_OR_DEPARTMENT_OTHER): Payer: Self-pay | Admitting: Physical Therapy

## 2021-11-11 DIAGNOSIS — R262 Difficulty in walking, not elsewhere classified: Secondary | ICD-10-CM | POA: Diagnosis not present

## 2021-11-11 DIAGNOSIS — M545 Low back pain, unspecified: Secondary | ICD-10-CM | POA: Diagnosis not present

## 2021-11-11 DIAGNOSIS — M6281 Muscle weakness (generalized): Secondary | ICD-10-CM | POA: Insufficient documentation

## 2021-11-11 DIAGNOSIS — Z9181 History of falling: Secondary | ICD-10-CM | POA: Diagnosis not present

## 2021-11-11 DIAGNOSIS — M961 Postlaminectomy syndrome, not elsewhere classified: Secondary | ICD-10-CM | POA: Diagnosis not present

## 2021-11-11 NOTE — Therapy (Signed)
[facility] [address] [place], [place], [address] Phone: [phone]   Fax:  [phone]  Physical Therapy Treatment  Patient Details  Name: Terri Rodriguez MRN: [id number] Date of Birth: 11/11/1946 Referring Provider (PT): Dr. Alysia Penna   Encounter Date: 11/11/2021   PT End of Session - 11/11/21 1305     Visit Number 3    Date for PT Re-Evaluation 12/17/21    Authorization Type BCBS Medicare    PT Start Time 1200    PT Stop Time 2458    PT Time Calculation (min) 48 min    Activity Tolerance Patient tolerated treatment well    Behavior During Therapy [facility] for tasks assessed/performed             Past Medical History:  Diagnosis Date   ALLERGIC RHINITIS    Anginal pain ([place])    Blood transfusion without reported diagnosis    Cataract    small right   Cholelithiasis 06/09/2008   gallstones 2009 on ct scan   Complication of anesthesia 2012   went to ICU after bilateral knees , unstable vital signs oct 2012, has surgery since did ok   Coronary artery disease    30 % narrowing lad   Diverticulitis    EPICONDYLITIS, LATERAL    osteoarthritis, previous joint replacements   Fall 10/2018   GERD    Hepatic hemangioma 06/09/08   Hiatal hernia 03/2000   Hx of adenomatous polyp of colon 06/05/08   Hx of cardiac catheterization 2008    clean coronarys DrKelly    Hx of chest pain 2003   neg cath remot hx of narrowwing lad in 2003 nl 2008, none in many years   HYPERLIPIDEMIA    IBS (irritable bowel syndrome)    LEUKOPENIA, CHRONIC    Mononucleosis 1963   RAYNAUD'S SYNDROME, HX OF    improved after cardiac meds initiated   Rosacea    facial   Sleep apnea    SLEEP APNEA, OBSTRUCTIVE    cpap, settings "automatic" settings 11-12   Subacute thyroiditis    UNSPECIFIED ANEMIA     Past Surgical History:  Procedure Laterality Date   ABDOMINAL HYSTERECTOMY  1993   bso   BACK SURGERY  2008   l 3 to l4 l4 to l5    BARTHOLIN GLAND CYST EXCISION Right 09/09/2019   Procedure: EXCISION OF VULVAR MASS TIMES TWO;  Surgeon: Megan Salon, MD;  Location: [place];  Service: Gynecology;  Laterality: Right;   CARDIAC CATHETERIZATION  04/07/2007   Noncritical coronary artery disease. Contiue medical therapy.   CARDIAC CATHETERIZATION  12/03/2007   Normal LV function. Mild angiographic mitral valve prolapse. Normal coronary arteries.   CARDIOVASCULAR STRESS TEST  11/09/2007   Moderate ischemia in Mid Anterior, Mid Anteroseptal, Apical Anterior, and Apical Septal regions. EKG negative for ischemia.   [place]   dental implants  11/2002,11/2011,11/2012   dental implants     x 6   DENTAL SURGERY     DILATION AND CURETTAGE OF UTERUS     d and e 1988 and 1990   JOINT REPLACEMENT  09/21/2011   bilateral knee   KNEE ARTHROSCOPY     bilateral   lumbar surgery fixation with disc replacement  10/08   Left   NASAL SEPTOPLASTY W/ TURBINOPLASTY  05/2000   NOCTURNAL POLYSOMNOGRAM  12/31/2006   Severe obstructive sleep apnea. AHI-87/hr   REPLACEMENT TOTAL KNEE  2012  bilateral   TONSILLECTOMY  1952   adenoids too   TOTAL HIP ARTHROPLASTY Left 10/21/2013   Procedure: LEFT TOTAL HIP ARTHROPLASTY ANTERIOR APPROACH;  Surgeon: [name], MD;  [facility];  Service: Orthopedics;  Laterality: Left;   TOTAL HIP ARTHROPLASTY Right 07/16/2014   Procedure: RIGHT TOTAL HIP ARTHROPLASTY ANTERIOR APPROACH;  Surgeon: [name], MD;  [facility];  Service: Orthopedics;  Laterality: Right;   TRANSTHORACIC ECHOCARDIOGRAM  11/09/2007   EF 24%, LV systolic function normal. Mild aortic root dilation.   WRIST SURGERY Left 04/03/14   ORIF "distal radial head fracture"    There were no vitals filed for this visit.   Subjective Assessment - 11/11/21 1255     Subjective "Pt presents with reports of pain in back 7/10.  Hoping aquatics will help ."                                         PT Education - 11/11/21 1304     Education Details properties of water;COGvsCOB;benefits of aquatic therapy    Person(s) Educated Patient    Methods Explanation    Comprehension Verbalized understanding              PT Short Term Goals - 10/20/21 1701       PT SHORT TERM GOAL #1   Title Independence with initial HEP    Time 4    Period Weeks    Status New    Target Date 11/17/21      PT SHORT TERM GOAL #2   Title Patient to be able to tolerate therapy without exacerbation of other orthopedic issues.    Time 4    Period Weeks    Status New    Target Date 11/17/21               PT Long Term Goals - 10/20/21 1702       PT LONG TERM GOAL #1   Title Patient to be indpendent with advanced HEP    Time 8    Period Weeks    Status New    Target Date 12/15/21      PT LONG TERM GOAL #2   Title Patient to be able to ambulate for 30 minutes with pain no greater than 2/10    Time 8    Period Weeks    Status New    Target Date 12/15/21      PT LONG TERM GOAL #3   Title Patient to be able to ascend and descend stairs with pain no greater than 2/10    Time 8    Period Weeks    Status New    Target Date 12/15/21      PT LONG TERM GOAL #4   Title Patient to be able to begin outpatient and continue aquatics program once discharged for ongoing pain control and strengthening program.    Time 8    Period Weeks    Status New    Target Date 12/15/21      PT LONG TERM GOAL #5   Title FOTO to be 51    Time 8    Period Weeks    Status New    Target Date 12/21/21           Pt seen for aquatic therapy today.  Treatment took place in water 3.25-4.8 ft  in depth at the [facility] pool. Temp of water was 94.  Pt entered/exited the pool via stairs (step through pattern) independently with bilat rail. Pt introduced to setting Walking fwd, retro and side stepping using hand buoys for ue  support  Seated Stretching gastroc, hamstring and adductors 3rd water step: STS with and without ue assist. Cues for weight shifting, weight bearing for immediate standing balance and use of momentum   -cycling 3 x 30 seconds   -flutter kick at hip 2 x 30 seconds  Standing Hip hinges using kick board 2x 12 Gentle core rotation 2x10 marching  Gait and balance Cross over stepping 4 widths of pool ue supported by hand buoys    Pt requires buoyancy for support and to offload joints with strengthening exercises. Viscosity of the water is needed for resistance of strengthening; water current perturbations provides challenge to standing balance unsupported, requiring increased core activation.          Plan - 11/11/21 1305     Clinical Impression Statement Pt introduced to setting.  She demonstrates slight apprehension but becomes increasingly confident as session progresses. She is directed through stretching and strengthening exercises.  Pt edu on importance of core strength and control of body submerged.  She reports decreased LBP with session.  Needs multiple vc for relaxation of shoulders (causing neck pain) and tightend core for improved core control submerged.  Completed STS activity on 3rd water step with focus on weight shifting and immediate standing balance reiterating instruction from last on land session. Pt good candidate for aquatics to faciliatate and hasten progression towards goals.    Personal Factors and Comorbidities Age;Behavior Pattern;Comorbidity 1;Comorbidity 2;Comorbidity 3+;Time since onset of injury/illness/exacerbation    Stability/Clinical Decision Making Evolving/Moderate complexity    Clinical Decision Making Moderate    Rehab Potential Fair    PT Frequency 2x / week    PT Duration 8 weeks    PT Treatment/Interventions ADLs/Self Care Home Management;Aquatic Therapy;Biofeedback;Canalith Repostioning;Traction;Moist Heat;Iontophoresis 4mg /ml  Dexamethasone;Electrical Stimulation;Cryotherapy;Ultrasound;DME Instruction;Gait training;Therapeutic exercise;Therapeutic activities;Functional mobility training;Stair training;Balance training;Neuromuscular re-education;Patient/family education;Manual techniques;Passive range of motion;Vestibular;Vasopneumatic Device;Taping;Energy conservation;Dry needling;Spinal Manipulations;Joint Manipulations    PT Next Visit Plan Aquatics: toleration to last session. Advance stregnthening, address shoulders             Patient will benefit from skilled therapeutic intervention in order to improve the following deficits and impairments:  Abnormal gait, Decreased balance, Decreased endurance, Decreased mobility, Difficulty walking, Hypomobility, Increased muscle spasms, Obesity, Decreased activity tolerance, Decreased strength, Increased fascial restricitons, Pain  Visit Diagnosis: Lumbar post-laminectomy syndrome  Muscle weakness (generalized)  Pain in left lumbar region of back  History of falling  Difficulty in walking, not elsewhere classified     Problem List Patient Active Problem List   Diagnosis Date Noted   Lumbar post-laminectomy syndrome 10/07/2021   Cervical spondylosis without myelopathy 10/07/2021   Primary osteoarthritis of both feet 06/18/2017   Status post bilateral total hip replacement 06/18/2017   Status post total knee replacement, bilateral 06/18/2017   Degenerative disc disease, lumbar status post fusion 06/18/2017   Degenerative disc disease, cervical 06/18/2017   Raynaud's disease without gangrene 06/18/2017   History of cholelithiasis 06/18/2017   Other chronic pain 02/01/2017   Visit for preventive health examination 01/21/2015   Medicare annual wellness visit, subsequent 01/21/2015   BMI 40.0-44.9, adult ([place]) 01/21/2015   Hyperlipidemia LDL goal <70 01/12/2015   Morbid obesity ([place]) 01/12/2015   Hyperglycemia 01/10/2014   Mild CAD 01/06/2014   Medication  monitoring encounter 09/22/2012   Hx of cardiac catheterization    Postmenopausal HRT (hormone replacement therapy) 09/20/2012   Adenomatous polyp of colon 06/14/2012   ROSACEA 04/26/2010   Obstructive sleep apnea 09/09/2009   LEUKOPENIA, CHRONIC 01/08/2008   Elevated lipids 08/17/2007   ALLERGIC RHINITIS 08/17/2007   GERD 08/17/2007   Primary osteoarthritis of both hands 08/17/2007    Annamarie Major) Shinita Mac MPT 11/11/2021, 1:13 PM  [facility] [address] [place], [place], [address] Phone: [phone]   Fax:  [phone]  Name: Terri Rodriguez MRN: [id number] Date of Birth: 1945/12/16

## 2021-11-16 ENCOUNTER — Other Ambulatory Visit: Payer: Self-pay | Admitting: Internal Medicine

## 2021-11-17 ENCOUNTER — Ambulatory Visit: Payer: Medicare Other | Admitting: Gastroenterology

## 2021-11-17 ENCOUNTER — Ambulatory Visit (HOSPITAL_BASED_OUTPATIENT_CLINIC_OR_DEPARTMENT_OTHER): Payer: Medicare Other | Admitting: Physical Therapy

## 2021-11-24 ENCOUNTER — Encounter (HOSPITAL_BASED_OUTPATIENT_CLINIC_OR_DEPARTMENT_OTHER): Payer: Self-pay | Admitting: Physical Therapy

## 2021-11-24 ENCOUNTER — Ambulatory Visit (HOSPITAL_BASED_OUTPATIENT_CLINIC_OR_DEPARTMENT_OTHER): Payer: Medicare Other | Admitting: Physical Therapy

## 2021-11-24 ENCOUNTER — Other Ambulatory Visit: Payer: Self-pay

## 2021-11-24 DIAGNOSIS — M6281 Muscle weakness (generalized): Secondary | ICD-10-CM

## 2021-11-24 DIAGNOSIS — M545 Low back pain, unspecified: Secondary | ICD-10-CM

## 2021-11-24 DIAGNOSIS — M961 Postlaminectomy syndrome, not elsewhere classified: Secondary | ICD-10-CM | POA: Diagnosis not present

## 2021-11-24 DIAGNOSIS — Z9181 History of falling: Secondary | ICD-10-CM | POA: Diagnosis not present

## 2021-11-24 DIAGNOSIS — R262 Difficulty in walking, not elsewhere classified: Secondary | ICD-10-CM | POA: Diagnosis not present

## 2021-11-24 NOTE — Therapy (Signed)
[facility] [address] [place], [place], [address] Phone: [phone]   Fax:  [phone]  Physical Therapy Treatment  Patient Details  Name: Terri Rodriguez MRN: [id number] Date of Birth: [birth year] Referring Provider (PT): Dr. Alysia Penna   Encounter Date: 11/24/2021   PT End of Session - 11/24/21 1126     Visit Number 4    Date for PT Re-Evaluation 12/17/21    Authorization Type BCBS Medicare    PT Start Time 6468    PT Stop Time 1203    PT Time Calculation (min) 38 min    Activity Tolerance Patient tolerated treatment well    Behavior During Therapy [facility] for tasks assessed/performed             Past Medical History:  Diagnosis Date   ALLERGIC RHINITIS    Anginal pain ([place])    Blood transfusion without reported diagnosis    Cataract    small right   Cholelithiasis 06/09/2008   gallstones 2009 on ct scan   Complication of anesthesia 2012   went to ICU after bilateral knees , unstable vital signs oct 2012, has surgery since did ok   Coronary artery disease    30 % narrowing lad   Diverticulitis    EPICONDYLITIS, LATERAL    osteoarthritis, previous joint replacements   Fall 10/2018   GERD    Hepatic hemangioma 06/09/08   Hiatal hernia 03/2000   Hx of adenomatous polyp of colon 06/05/08   Hx of cardiac catheterization 2008    clean coronarys DrKelly    Hx of chest pain 2003   neg cath remot hx of narrowwing lad in 2003 nl 2008, none in many years   HYPERLIPIDEMIA    IBS (irritable bowel syndrome)    LEUKOPENIA, CHRONIC    Mononucleosis 1963   RAYNAUD'S SYNDROME, HX OF    improved after cardiac meds initiated   Rosacea    facial   Sleep apnea    SLEEP APNEA, OBSTRUCTIVE    cpap, settings "automatic" settings 11-12   Subacute thyroiditis    UNSPECIFIED ANEMIA     Past Surgical History:  Procedure Laterality Date   ABDOMINAL HYSTERECTOMY  1993   bso   BACK SURGERY  2008   l 3 to l4 l4 to l5    BARTHOLIN GLAND CYST EXCISION Right 09/09/2019   Procedure: EXCISION OF VULVAR MASS TIMES TWO;  Surgeon: Megan Salon, MD;  Location: [facility];  Service: Gynecology;  Laterality: Right;   CARDIAC CATHETERIZATION  04/07/2007   Noncritical coronary artery disease. Contiue medical therapy.   CARDIAC CATHETERIZATION  12/03/2007   Normal LV function. Mild angiographic mitral valve prolapse. Normal coronary arteries.   CARDIOVASCULAR STRESS TEST  11/09/2007   Moderate ischemia in Mid Anterior, Mid Anteroseptal, Apical Anterior, and Apical Septal regions. EKG negative for ischemia.   [place]   dental implants  11/2002,11/2011,11/2012   dental implants     x 6   DENTAL SURGERY     DILATION AND CURETTAGE OF UTERUS     d and e 1988 and 1990   JOINT REPLACEMENT  09/21/2011   bilateral knee   KNEE ARTHROSCOPY     bilateral   lumbar surgery fixation with disc replacement  10/08   Left   NASAL SEPTOPLASTY W/ TURBINOPLASTY  05/2000   NOCTURNAL POLYSOMNOGRAM  12/31/2006   Severe obstructive sleep apnea. AHI-87/hr   REPLACEMENT TOTAL KNEE  2012  bilateral   TONSILLECTOMY  1952   adenoids too   TOTAL HIP ARTHROPLASTY Left 10/21/2013   Procedure: LEFT TOTAL HIP ARTHROPLASTY ANTERIOR APPROACH;  Surgeon: [name], MD;  Location: [facility];  Service: Orthopedics;  Laterality: Left;   TOTAL HIP ARTHROPLASTY Right 07/16/2014   Procedure: RIGHT TOTAL HIP ARTHROPLASTY ANTERIOR APPROACH;  Surgeon: [name], MD;  Location: [facility];  Service: Orthopedics;  Laterality: Right;   TRANSTHORACIC ECHOCARDIOGRAM  11/09/2007   EF 67%, LV systolic function normal. Mild aortic root dilation.   WRIST SURGERY Left 04/03/14   ORIF "distal radial head fracture"    There were no vitals filed for this visit.   Subjective Assessment - 11/24/21 1131     Subjective "My shoulders hurt really bad for a few days after last session 8/10"    Pertinent History Long history of medical  and surgical diagnoses.    Limitations Sitting;Lifting;Standing;Walking    How long can you sit comfortably? 15 min    How long can you stand comfortably? 5 min    How long can you walk comfortably? 10 min    Diagnostic tests none recent    Patient Stated Goals I would like "not to be angry" and to be able to do more standing and walking with my family.    Pain Score 8     Pain Location Shoulder    Pain Orientation Right;Left    Pain Descriptors / Indicators Aching    Pain Type Chronic pain    Pain Onset More than a month ago    Pain Frequency Intermittent                                          PT Short Term Goals - 10/20/21 1701       PT SHORT TERM GOAL #1   Title Independence with initial HEP    Time 4    Period Weeks    Status New    Target Date 11/17/21      PT SHORT TERM GOAL #2   Title Patient to be able to tolerate therapy without exacerbation of other orthopedic issues.    Time 4    Period Weeks    Status New    Target Date 11/17/21               PT Long Term Goals - 10/20/21 1702       PT LONG TERM GOAL #1   Title Patient to be indpendent with advanced HEP    Time 8    Period Weeks    Status New    Target Date 12/15/21      PT LONG TERM GOAL #2   Title Patient to be able to ambulate for 30 minutes with pain no greater than 2/10    Time 8    Period Weeks    Status New    Target Date 12/15/21      PT LONG TERM GOAL #3   Title Patient to be able to ascend and descend stairs with pain no greater than 2/10    Time 8    Period Weeks    Status New    Target Date 12/15/21      PT LONG TERM GOAL #4   Title Patient to be able to begin outpatient and continue aquatics program once discharged for ongoing pain control  and strengthening program.    Time 8    Period Weeks    Status New    Target Date 12/15/21      PT LONG TERM GOAL #5   Title FOTO to be 50    Time 8    Period Weeks    Status New    Target Date  12/21/21            Pt seen for aquatic therapy today.  Treatment took place in water 3.25-4.8 ft in depth at the [facility] pool. Temp of water was 94.  Pt entered/exited the pool via stairs (step through pattern) independently with bilat rail. Pt introduced to setting Walking fwd, retro and side stepping using hand buoys for ue support   Seated Stretching gastroc, hamstring and adductors 3rd water step: STS with and without ue assist x10.  Cues for weight shifting, weight bearing for immediate standing balance and use of momentum                         -cycling 3 x 30 seconds                         -flutter kick at hip 2 x 30 seconds   Standing Hip ext; abd/add; hip flex; marching 2x10.  Supporte on wall. Cues for core tightness throughout UE: horizontal add/abd x10, shld flex/ext; add/abd x5-8 Kick board push downs 2x10   Pt requires buoyancy for support and to offload joints with strengthening exercises. Viscosity of the water is needed for resistance of strengthening; water current perturbations provides challenge to standing balance unsupported, requiring increased core activation.       Plan - 11/24/21 1207     Clinical Impression Statement Pt reportig increase in shoulder pain after last session.  Added ROM/stretching and genlte strengtheing of shoulders today.  Pain decreased once submerged. She tolerates all prescribed exercises well.  Added focus on core strength cueing for isometic contractions, holding with completion of exercises.    Stability/Clinical Decision Making Evolving/Moderate complexity    Clinical Decision Making Moderate    Rehab Potential Fair    PT Frequency 2x / week    PT Duration 8 weeks    PT Treatment/Interventions ADLs/Self Care Home Management;Aquatic Therapy;Biofeedback;Canalith Repostioning;Traction;Moist Heat;Iontophoresis 4mg /ml Dexamethasone;Electrical Stimulation;Cryotherapy;Ultrasound;DME Instruction;Gait training;Therapeutic  exercise;Therapeutic activities;Functional mobility training;Stair training;Balance training;Neuromuscular re-education;Patient/family education;Manual techniques;Passive range of motion;Vestibular;Vasopneumatic Device;Taping;Energy conservation;Dry needling;Spinal Manipulations;Joint Manipulations    PT Next Visit Plan shoulder pain after session?? advance strengthening             Patient will benefit from skilled therapeutic intervention in order to improve the following deficits and impairments:  Abnormal gait, Decreased balance, Decreased endurance, Decreased mobility, Difficulty walking, Hypomobility, Increased muscle spasms, Obesity, Decreased activity tolerance, Decreased strength, Increased fascial restricitons, Pain  Visit Diagnosis: Lumbar post-laminectomy syndrome  Muscle weakness (generalized)  Pain in left lumbar region of back  History of falling  Difficulty in walking, not elsewhere classified     Problem List Patient Active Problem List   Diagnosis Date Noted   Lumbar post-laminectomy syndrome 10/07/2021   Cervical spondylosis without myelopathy 10/07/2021   Primary osteoarthritis of both feet 06/18/2017   Status post bilateral total hip replacement 06/18/2017   Status post total knee replacement, bilateral 06/18/2017   Degenerative disc disease, lumbar status post fusion 06/18/2017   Degenerative disc disease, cervical 06/18/2017   Raynaud's disease without gangrene 06/18/2017  History of cholelithiasis 06/18/2017   Other chronic pain 02/01/2017   Visit for preventive health examination 01/21/2015   Medicare annual wellness visit, subsequent 01/21/2015   BMI 40.0-44.9, adult ([place]) 01/21/2015   Hyperlipidemia LDL goal <70 01/12/2015   Morbid obesity ([place]) 01/12/2015   Hyperglycemia 01/10/2014   Mild CAD 01/06/2014   Medication monitoring encounter 09/22/2012   Hx of cardiac catheterization    Postmenopausal HRT (hormone replacement therapy) 09/20/2012    Adenomatous polyp of colon 06/14/2012   ROSACEA 04/26/2010   Obstructive sleep apnea 09/09/2009   LEUKOPENIA, CHRONIC 01/08/2008   Elevated lipids 08/17/2007   ALLERGIC RHINITIS 08/17/2007   GERD 08/17/2007   Primary osteoarthritis of both hands 08/17/2007    Annamarie Major) Holley Wirt MPT 11/24/2021, 1:44 PM  [facility] [address] [place], [place], [address] Phone: [phone]   Fax:  [phone]  Name: Terri Rodriguez MRN: [id number] Date of Birth: Jan 12, 1946

## 2021-12-02 ENCOUNTER — Encounter: Payer: Self-pay | Admitting: Internal Medicine

## 2021-12-02 ENCOUNTER — Telehealth: Payer: Medicare Other | Admitting: Internal Medicine

## 2021-12-02 ENCOUNTER — Telehealth (INDEPENDENT_AMBULATORY_CARE_PROVIDER_SITE_OTHER): Payer: Medicare Other | Admitting: Internal Medicine

## 2021-12-02 DIAGNOSIS — R7989 Other specified abnormal findings of blood chemistry: Secondary | ICD-10-CM

## 2021-12-02 DIAGNOSIS — M25512 Pain in left shoulder: Secondary | ICD-10-CM

## 2021-12-02 DIAGNOSIS — M19041 Primary osteoarthritis, right hand: Secondary | ICD-10-CM | POA: Diagnosis not present

## 2021-12-02 DIAGNOSIS — T887XXA Unspecified adverse effect of drug or medicament, initial encounter: Secondary | ICD-10-CM

## 2021-12-02 DIAGNOSIS — R413 Other amnesia: Secondary | ICD-10-CM | POA: Diagnosis not present

## 2021-12-02 DIAGNOSIS — M25511 Pain in right shoulder: Secondary | ICD-10-CM | POA: Diagnosis not present

## 2021-12-02 DIAGNOSIS — M19042 Primary osteoarthritis, left hand: Secondary | ICD-10-CM

## 2021-12-02 DIAGNOSIS — G8929 Other chronic pain: Secondary | ICD-10-CM

## 2021-12-02 MED ORDER — PREGABALIN 50 MG PO CAPS: ORAL_CAPSULE | ORAL | 1 refills | Status: DC

## 2021-12-02 NOTE — Progress Notes (Signed)
Virtual Visit via Video Note  I connected with NAME@ on 12/02/21 at 11:00 AM EST by a video enabled telemedicine application and verified that I am speaking with the correct person using two identifiers. [place]  [place] office Persons participating in the virtual visit: patient, provider  [place] national recommendations  regarding COVID 19 pandemic   video visit is advised over in office visit for this patient.  Patient aware  of the limitations of evaluation and management by telemedicine and  availability of in person appointments. and agreed to proceed.   HPI: Terri Rodriguez presents for video visit because of concerns of side effects of the increased dose of Lyrica.  She had increased to 100 mg 3 times daily.  Has noticed over the time memory issues getting worse name calling taking time to remember the name of her grandkids other areas.  Of note her brother who had been on similar medicine postop for C-spine surgery had that side effect and resolved with resolution and stopping the medicine.  She has been off anti-inflammatories because of the abnormal liver tests and abnormal liver ultrasound.  She has been having bilateral shoulder area pain and discomfort has been difficult. She calls the problem she has aphasia or poor poorly naming recovering remembering. Prefers not to go to another specialist at this time.    No other new findings ROS: See pertinent positives and negatives per HPI.  Past Medical History:  Diagnosis Date   ALLERGIC RHINITIS    Anginal pain ([place])    Blood transfusion without reported diagnosis    Cataract    small right   Cholelithiasis 06/09/2008   gallstones 2009 on ct scan   Complication of anesthesia 2012   went to ICU after bilateral knees , unstable vital signs oct 2012, has surgery since did ok   Coronary artery disease    30 % narrowing lad   Diverticulitis    EPICONDYLITIS, LATERAL    osteoarthritis, previous  joint replacements   Fall 10/2018   GERD    Hepatic hemangioma 06/09/08   Hiatal hernia 03/2000   Hx of adenomatous polyp of colon 06/05/08   Hx of cardiac catheterization 2008    clean coronarys DrKelly    Hx of chest pain 2003   neg cath remot hx of narrowwing lad in 2003 nl 2008, none in many years   HYPERLIPIDEMIA    IBS (irritable bowel syndrome)    LEUKOPENIA, CHRONIC    Mononucleosis 1963   RAYNAUD'S SYNDROME, HX OF    improved after cardiac meds initiated   Rosacea    facial   Sleep apnea    SLEEP APNEA, OBSTRUCTIVE    cpap, settings "automatic" settings 11-12   Subacute thyroiditis    UNSPECIFIED ANEMIA     Past Surgical History:  Procedure Laterality Date   ABDOMINAL HYSTERECTOMY  1993   bso   BACK SURGERY  2008   l 3 to l4 l4 to l5   BARTHOLIN GLAND CYST EXCISION Right 09/09/2019   Procedure: EXCISION OF VULVAR MASS TIMES TWO;  Surgeon: Megan Salon, MD;  Location: [facility];  Service: Gynecology;  Laterality: Right;   CARDIAC CATHETERIZATION  04/07/2007   Noncritical coronary artery disease. Contiue medical therapy.   CARDIAC CATHETERIZATION  12/03/2007   Normal LV function. Mild angiographic mitral valve prolapse. Normal coronary arteries.   CARDIOVASCULAR STRESS TEST  11/09/2007   Moderate ischemia in Mid Anterior, Mid Anteroseptal, Apical Anterior,  and Apical Septal regions. EKG negative for ischemia.   [place]   dental implants  11/2002,11/2011,11/2012   dental implants     x 6   DENTAL SURGERY     DILATION AND CURETTAGE OF UTERUS     d and e 1988 and 1990   JOINT REPLACEMENT  09/21/2011   bilateral knee   KNEE ARTHROSCOPY     bilateral   lumbar surgery fixation with disc replacement  10/08   Left   NASAL SEPTOPLASTY W/ TURBINOPLASTY  05/2000   NOCTURNAL POLYSOMNOGRAM  12/31/2006   Severe obstructive sleep apnea. AHI-87/hr   REPLACEMENT TOTAL KNEE  2012   bilateral   TONSILLECTOMY  1952   adenoids too   TOTAL HIP  ARTHROPLASTY Left 10/21/2013   Procedure: LEFT TOTAL HIP ARTHROPLASTY ANTERIOR APPROACH;  Surgeon: [name], MD;  [facility];  Service: Orthopedics;  Laterality: Left;   TOTAL HIP ARTHROPLASTY Right 07/16/2014   Procedure: RIGHT TOTAL HIP ARTHROPLASTY ANTERIOR APPROACH;  Surgeon: [name], MD;  [facility];  Service: Orthopedics;  Laterality: Right;   TRANSTHORACIC ECHOCARDIOGRAM  11/09/2007   EF 16%, LV systolic function normal. Mild aortic root dilation.   WRIST SURGERY Left 04/03/14   ORIF "distal radial head fracture"    Family History  Problem Relation Age of Onset   Rheum arthritis Mother    Diabetes Mother    Heart failure Mother    Thrombocytopenia Mother    Sleep apnea Mother    Ulcers Mother        PUD   Deep vein thrombosis Father    Pulmonary embolism Father    Osteoarthritis Father    Atrial fibrillation Father    Dementia Father    Sleep apnea Father    Sleep apnea Brother    Obesity Brother    Sleep apnea Brother    Obesity Brother    Colon cancer Maternal Aunt 1   Colon cancer Maternal Aunt 90   Pancreatic cancer Other    Uterine cancer Other    Leukemia Other    Inflammatory bowel disease Other        aunt   Congenital adrenal hyperplasia Grandchild    Atrial fibrillation Brother    Esophageal cancer Neg Hx    Rectal cancer Neg Hx    Stomach cancer Neg Hx     Social History   Tobacco Use   Smoking status: Never   Smokeless tobacco: Never  Vaping Use   Vaping Use: Never used  Substance Use Topics   Alcohol use: Not Currently    Alcohol/week: 14.0 standard drinks    Types: 14 Glasses of wine per week    Comment: wine 2 glasses per day   Drug use: No      Current Outpatient Medications:    amLODipine (NORVASC) 5 MG tablet, TAKE 1 TABLET BY MOUTH EVERY DAY, Disp: 90 tablet, Rfl: 1   amoxicillin (AMOXIL) 500 MG capsule, 2,000 mg. 1 hour prior to dental procedures., Disp: , Rfl:    aspirin 81 MG tablet, Take 81 mg by  mouth daily., Disp: , Rfl:    atorvastatin (LIPITOR) 20 MG tablet, TAKE 1 TABLET BY MOUTH EVERY DAY AT 6 PM, Disp: 90 tablet, Rfl: 2   Azelaic Acid 15 % cream, Apply 1 application topically 2 (two) times daily. After skin is thoroughly washed and patted dry, gently but thoroughly massage a thin film of azelaic acid cream into the  affected area t daily,after shower., Disp: , Rfl:    carvedilol (COREG) 3.125 MG tablet, Take 1 tablet by mouth 2 times daily with a meal., Disp: 180 tablet, Rfl: 1   chlorhexidine (PERIDEX) 0.12 % solution, SMARTSIG:By Mouth, Disp: , Rfl:    Cholecalciferol (VITAMIN D3) 2000 UNITS TABS, Take 1 tablet by mouth daily., Disp: , Rfl:    clindamycin (CLEOCIN T) 1 % external solution, 2 (two) times daily as needed., Disp: , Rfl: 3   clobetasol (TEMOVATE) 0.05 % external solution, Apply topically., Disp: , Rfl:    diclofenac sodium (VOLTAREN) 1 % GEL, Apply 2-4 g topically 4 (four) times daily. As needed for  Arthritis  Pain., Disp: 4 Tube, Rfl: 1   DULoxetine (CYMBALTA) 60 MG capsule, Cymbalta 60 mg capsule,delayed release  Take 1 capsule every day by oral route., Disp: , Rfl:    estradiol (ESTRACE) 0.5 MG tablet, 1/2 tab daily (Patient taking differently: 1/2 tab every other day), Disp: 45 tablet, Rfl: 4   Fexofenadine HCl (ALLEGRA ALLERGY PO), Allegra Allergy, Disp: , Rfl:    fluocinonide (LIDEX) 0.05 % external solution, Apply AA on scalp PRN, Disp: , Rfl: 0   HYDROcodone bit-homatropine (HYCODAN) 5-1.5 MG/5ML syrup, Take 5 mLs by mouth every 6 (six) hours as needed for cough., Disp: 120 mL, Rfl: 0   ketoconazole (NIZORAL) 2 % cream, SMARTSIG:1 Application Topical 1 to 2 Times Daily, Disp: , Rfl:    metroNIDAZOLE (METROGEL) 0.75 % gel, APPLY TO AFFECTED AREA TWICE A DAY, Disp: , Rfl:    Multiple Vitamins-Minerals (CENTRUM ADULTS PO), Take 1 tablet by mouth daily., Disp: , Rfl:    nitroGLYCERIN (NITROSTAT) 0.4 MG SL tablet, PLACE 1 TABLET UNDER THE TONGUE EVERY 5 MINUTES AS  NEEDED FOR CHEST PAIN, Disp: 25 tablet, Rfl: 3   omeprazole (PRILOSEC) 20 MG capsule, Take 20 mg by mouth in the morning and at bedtime., Disp: , Rfl:    oseltamivir (TAMIFLU) 75 MG capsule, Take 1 capsule (75 mg total) by mouth 2 (two) times daily., Disp: 10 capsule, Rfl: 0   pregabalin (LYRICA) 100 MG capsule, TAKE 1 CAPSULE BY MOUTH 3 TIMES DAILY, Disp: 90 capsule, Rfl: 0   pregabalin (LYRICA) 50 MG capsule, Take orally weaning with 100mg  ; !00 mg 50 mg 100 mg , per day  for one week ,then decrease to 100 -50 -50 mg per day, then 50 mg tid or as directed, Disp: 42 capsule, Rfl: 1   ramipril (ALTACE) 10 MG capsule, TAKE 1 CAPSULE BY MOUTH EVERY DAY, Disp: 90 capsule, Rfl: 1   valACYclovir (VALTREX) 1000 MG tablet, TAKE 2 TABLET BY MOUTH Q12 hours X 2 DOSES WITH FEVER BLISTERS, Disp: 30 tablet, Rfl: 1  EXAM: BP Readings from Last 3 Encounters:  10/07/21 102/63  09/20/21 110/60  08/03/21 116/60    VITALS per patient if applicable:  GENERAL: alert, oriented, appears well and in no acute distress  HEENT: atraumatic, conjunttiva clear, no obvious abnormalities on inspection of external nose and ears  NECK: normal movements of the head and neck  LUNGS: on inspection no signs of respiratory distress, breathing rate appears normal, no obvious gross SOB, gasping or wheezing  CV: no obvious cyanosis  MS: moves all visible extremities without noticeable abnormality  PSYCH/NEURO: pleasant and cooperative, no obvious depression or anxiety, speech and thought processing grossly intact but had to ask her husband to name youngest gc name quickly  .  Lab Results  Component Value Date  WBC 3.1 (L) 08/04/2021   HGB 12.4 08/04/2021   HCT 36.1 08/04/2021   PLT 194.0 08/04/2021   GLUCOSE 99 08/04/2021   CHOL 152 08/04/2021   TRIG 61.0 08/04/2021   HDL 72.70 08/04/2021   LDLCALC 67 08/04/2021   ALT 28 08/04/2021   AST 40 (H) 08/04/2021   NA 141 08/04/2021   K 4.2 08/04/2021   CL 108  08/04/2021   CREATININE 0.77 08/04/2021   BUN 17 08/04/2021   CO2 26 08/04/2021   TSH 1.58 08/04/2021   INR 1.0 04/13/2021   HGBA1C 5.7 08/04/2021    ASSESSMENT AND PLAN:  Discussed the following assessment and plan:    ICD-10-CM   1. Complaints of memory disturbance  R41.3     2. Medication side effect  T88.7XXA     3. Bilateral shoulder pain, unspecified chronicity  M25.511    M25.512     4. Primary osteoarthritis of both hands  M19.041    M19.042     5. Abnormal LFTs  R79.89     6. Other chronic pain  G89.29      Agree  that inc dose of lyric (although helped pain initially) may be causing CNS se  dose related . Plan wean down slowly to 50 tid  send in 50 mg to local pharmacy  Counseled.  Can try 1 week of celebrex  for the shoulder [(pain since not on any antiinflammatory) do not think will be sig deleterious to liver situation.    Expectant management and discussion of plan and treatment with opportunity to ask questions and all were answered. The patient agreed with the plan and demonstrated an understanding of the instructions.   Advised to call back or seek an in-person evaluation if worsening  or having  further concerns  in interim. Return for 1 month med check .   Shanon Ace, MD

## 2021-12-09 ENCOUNTER — Encounter: Payer: Medicare Other | Attending: Physical Medicine & Rehabilitation | Admitting: Physical Medicine & Rehabilitation

## 2021-12-09 ENCOUNTER — Encounter: Payer: Self-pay | Admitting: Physical Medicine & Rehabilitation

## 2021-12-09 ENCOUNTER — Other Ambulatory Visit: Payer: Self-pay

## 2021-12-09 VITALS — BP 120/67 | HR 74 | Temp 98.4°F | Ht 65.0 in | Wt 255.2 lb

## 2021-12-09 DIAGNOSIS — M961 Postlaminectomy syndrome, not elsewhere classified: Secondary | ICD-10-CM | POA: Diagnosis not present

## 2021-12-09 DIAGNOSIS — M797 Fibromyalgia: Secondary | ICD-10-CM | POA: Insufficient documentation

## 2021-12-09 NOTE — Patient Instructions (Signed)
If you do not continue aquatic therapy , I encourage you to rejoin [facility] and attend aquatic group exercise class

## 2021-12-09 NOTE — Progress Notes (Signed)
Subjective:    Patient ID: Terri Rodriguez, female    DOB: August 16, 1946, 76 y.o.   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 Sherwood Gambler at L3-4 and L4-5 levels. Low back pain is mainly below the waist she has no pains radiating down the lower extremities.   Bilateral hand osteoarthritis remains an issue and this impacts her ability to do arts and crafts.  She uses Voltaren gel to the hands twice a day.   In terms of neck pain she does have a history of right upper extremity radicular symptoms which have subsequently resolved.  She had conversations with Dr. Sherwood Gambler regarding possible decompression but this was never performed prior to his retirement.     Achilles tendon tear has subsequently healed   Bilateral TKR and THA surgeries as below.  Patient did well with her postoperative rehabilitation. HPI Has tried aquatic therapy which the patient enjoyed and found to be beneficial.  She is somewhat concerned about the $40 co-pays with each visit.  She indicates that in the past she has attended group aquatic exercise classes.  Has restarted celebrex 100mg  per day just today.  This was okayed by her primary care physician.  Weaning off pregabalin due to MS changes   Patient reports no change in widespread body pain related to reduction in pregabalin dose. Pain Inventory Average Pain 5 Pain Right Now 4 My pain is intermittent, stabbing, and aching  In the last 24 hours, has pain interfered with the following? General activity 5 Relation with others 0 Enjoyment of life 7 What TIME of day is your pain at its worst? morning  Sleep (in general) Good  Pain is worse with: bending, standing, and some activites Pain improves with: rest, heat/ice, and medication Relief from Meds: 5  Family History  Problem  Relation Age of Onset   Rheum arthritis Mother    Diabetes Mother    Heart failure Mother    Thrombocytopenia Mother    Sleep apnea Mother    Ulcers Mother        PUD   Deep vein thrombosis Father    Pulmonary embolism Father    Osteoarthritis Father    Atrial fibrillation Father    Dementia Father    Sleep apnea Father    Sleep apnea Brother    Obesity Brother    Sleep apnea Brother    Obesity Brother    Colon cancer Maternal Aunt 59   Colon cancer Maternal Aunt 90   Pancreatic cancer Other    Uterine cancer Other    Leukemia Other    Inflammatory bowel disease Other        aunt   Congenital adrenal hyperplasia Grandchild    Atrial fibrillation Brother    Esophageal cancer Neg Hx    Rectal cancer Neg Hx    Stomach cancer Neg Hx    Social History   Socioeconomic History   Marital status: Married    Spouse name: Not on file   Number of children: 1   Years of education: Not on file   Highest education level: Not on file  Occupational History   Occupation: Physician  Tobacco Use   Smoking status: Never   Smokeless tobacco: Never  Vaping Use   Vaping Use: Never used  Substance and Sexual Activity   Alcohol use: Not Currently  Alcohol/week: 14.0 standard drinks    Types: 14 Glasses of wine per week    Comment: wine 2 glasses per day   Drug use: No   Sexual activity: Yes    Partners: Male    Birth control/protection: Surgical    Comment: TAH/BSO  Other Topics Concern   Not on file  Social History Narrative   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   Social Determinants of Health   Financial Resource Strain: Low Risk    Difficulty of Paying Living Expenses: Not hard at all  Food Insecurity: No Food Insecurity   Worried About Charity fundraiser in the Last Year: Never true   [address] in the Last Year: Never true  Transportation Needs: No Transportation Needs   Lack of  Transportation (Medical): No   Lack of Transportation (Non-Medical): No  Physical Activity: Inactive   Days of Exercise per Week: 0 days   Minutes of Exercise per Session: 0 min  Stress: No Stress Concern Present   Feeling of Stress : Not at all  Social Connections: Moderately Integrated   Frequency of Communication with Friends and Family: More than three times a week   Frequency of Social Gatherings with Friends and Family: More than three times a week   Attends Religious Services: 1 to 4 times per year   Active Member of [employer] or Organizations: No   Attends Music therapist: Never   Marital Status: Married   Past Surgical History:  Procedure Laterality Date   [place]  2008   l 3 to l4 l4 to l5   BARTHOLIN GLAND CYST EXCISION Right 09/09/2019   Procedure: EXCISION OF VULVAR MASS TIMES TWO;  Surgeon: Megan Salon, MD;  Location: [facility];  Service: Gynecology;  Laterality: Right;   CARDIAC CATHETERIZATION  04/07/2007   Noncritical coronary artery disease. Contiue medical therapy.   CARDIAC CATHETERIZATION  12/03/2007   Normal LV function. Mild angiographic mitral valve prolapse. Normal coronary arteries.   CARDIOVASCULAR STRESS TEST  11/09/2007   Moderate ischemia in Mid Anterior, Mid Anteroseptal, Apical Anterior, and Apical Septal regions. EKG negative for ischemia.   [place]   dental implants  11/2002,11/2011,11/2012   dental implants     x 6   DENTAL SURGERY     DILATION AND CURETTAGE OF UTERUS     d and e 1988 and 1990   JOINT REPLACEMENT  09/21/2011   bilateral knee   KNEE ARTHROSCOPY     bilateral   lumbar surgery fixation with disc replacement  10/08   Left   NASAL SEPTOPLASTY W/ TURBINOPLASTY  05/2000   NOCTURNAL POLYSOMNOGRAM  12/31/2006   Severe obstructive sleep apnea. AHI-87/hr   REPLACEMENT TOTAL KNEE  2012   bilateral   TONSILLECTOMY  1952   adenoids too   TOTAL  HIP ARTHROPLASTY Left 10/21/2013   Procedure: LEFT TOTAL HIP ARTHROPLASTY ANTERIOR APPROACH;  Surgeon: [name], MD;  Location: [facility];  Service: Orthopedics;  Laterality: Left;   TOTAL HIP ARTHROPLASTY Right 07/16/2014   Procedure: RIGHT TOTAL HIP ARTHROPLASTY ANTERIOR APPROACH;  Surgeon: [name], MD;  Location: [facility];  Service: Orthopedics;  Laterality: Right;   TRANSTHORACIC ECHOCARDIOGRAM  11/09/2007   EF 54%, LV systolic function normal. Mild aortic  root dilation.   WRIST SURGERY Left 04/03/14   ORIF "distal radial head fracture"   Past Surgical History:  Procedure Laterality Date   ABDOMINAL HYSTERECTOMY  1993   bso   BACK SURGERY  2008   l 3 to l4 l4 to l5   BARTHOLIN GLAND CYST EXCISION Right 09/09/2019   Procedure: EXCISION OF VULVAR MASS TIMES TWO;  Surgeon: Megan Salon, MD;  Location: [facility];  Service: Gynecology;  Laterality: Right;   CARDIAC CATHETERIZATION  04/07/2007   Noncritical coronary artery disease. Contiue medical therapy.   CARDIAC CATHETERIZATION  12/03/2007   Normal LV function. Mild angiographic mitral valve prolapse. Normal coronary arteries.   CARDIOVASCULAR STRESS TEST  11/09/2007   Moderate ischemia in Mid Anterior, Mid Anteroseptal, Apical Anterior, and Apical Septal regions. EKG negative for ischemia.   [place]   dental implants  11/2002,11/2011,11/2012   dental implants     x 6   DENTAL SURGERY     DILATION AND CURETTAGE OF UTERUS     d and e 1988 and 1990   JOINT REPLACEMENT  09/21/2011   bilateral knee   KNEE ARTHROSCOPY     bilateral   lumbar surgery fixation with disc replacement  10/08   Left   NASAL SEPTOPLASTY W/ TURBINOPLASTY  05/2000   NOCTURNAL POLYSOMNOGRAM  12/31/2006   Severe obstructive sleep apnea. AHI-87/hr   REPLACEMENT TOTAL KNEE  2012   bilateral   TONSILLECTOMY  1952   adenoids too   TOTAL HIP ARTHROPLASTY Left 10/21/2013   Procedure: LEFT TOTAL HIP ARTHROPLASTY  ANTERIOR APPROACH;  Surgeon: [name], MD;  Location: [facility];  Service: Orthopedics;  Laterality: Left;   TOTAL HIP ARTHROPLASTY Right 07/16/2014   Procedure: RIGHT TOTAL HIP ARTHROPLASTY ANTERIOR APPROACH;  Surgeon: [name], MD;  Location: [facility];  Service: Orthopedics;  Laterality: Right;   TRANSTHORACIC ECHOCARDIOGRAM  11/09/2007   EF 46%, LV systolic function normal. Mild aortic root dilation.   WRIST SURGERY Left 04/03/14   ORIF "distal radial head fracture"   Past Medical History:  Diagnosis Date   ALLERGIC RHINITIS    Anginal pain ([place])    Blood transfusion without reported diagnosis    Cataract    small right   Cholelithiasis 06/09/2008   gallstones 2009 on ct scan   Complication of anesthesia 2012   went to ICU after bilateral knees , unstable vital signs oct 2012, has surgery since did ok   Coronary artery disease    30 % narrowing lad   Diverticulitis    EPICONDYLITIS, LATERAL    osteoarthritis, previous joint replacements   Fall 10/2018   GERD    Hepatic hemangioma 06/09/08   Hiatal hernia 03/2000   Hx of adenomatous polyp of colon 06/05/08   Hx of cardiac catheterization 2008    clean coronarys DrKelly    Hx of chest pain 2003   neg cath remot hx of narrowwing lad in 2003 nl 2008, none in many years   HYPERLIPIDEMIA    IBS (irritable bowel syndrome)    LEUKOPENIA, CHRONIC    Mononucleosis 1963   RAYNAUD'S SYNDROME, HX OF    improved after cardiac meds initiated   Rosacea    facial   Sleep apnea    SLEEP APNEA, OBSTRUCTIVE    cpap, settings "automatic" settings 11-12   Subacute thyroiditis    UNSPECIFIED ANEMIA    BP 120/67    Pulse 74  Temp 98.4 F (36.9 C)    Ht 5\' 5"  (1.651 m)    Wt 255 lb 3.2 oz (115.8 kg)    SpO2 95%    BMI 42.47 kg/m   Opioid Risk Score:   Fall Risk Score:  `1  Depression screen PHQ 2/9  Depression screen [facility] 2/9 12/09/2021 10/07/2021 04/12/2021 01/18/2021 09/16/2020 09/02/2019 02/05/2018  Decreased Interest 1 1 0 0 0 0 0   Down, Depressed, Hopeless 1 1 0 0 0 1 0  PHQ - 2 Score 2 2 0 0 0 1 0  Altered sleeping - 0 - - - - -  Tired, decreased energy - 1 - - - - -  Change in appetite - 1 - - - - -  Feeling bad or failure about yourself  - 1 - - - - -  Trouble concentrating - 0 - - - - -  Moving slowly or fidgety/restless - 0 - - - - -  Suicidal thoughts - 0 - - - - -  PHQ-9 Score - 5 - - - - -  Some recent data might be hidden    Review of Systems  Constitutional: Negative.   HENT: Negative.    Eyes: Negative.   Respiratory: Negative.    Cardiovascular: Negative.   Gastrointestinal: Negative.   Endocrine: Negative.   Genitourinary: Negative.   Musculoskeletal:  Positive for back pain and gait problem.       Shoulders hands knees  Skin: Negative.   Allergic/Immunologic: Negative.   Hematological: Negative.   Psychiatric/Behavioral:  Positive for dysphoric mood.   All other systems reviewed and are negative.     Objective:   Physical Exam Vitals and nursing note reviewed.  Constitutional:      Appearance: She is normal weight.  HENT:     Head: Normocephalic and atraumatic.  Skin:    General: Skin is warm and dry.  Neurological:     General: No focal deficit present.     Mental Status: She is alert and oriented to person, place, and time.  Psychiatric:        Mood and Affect: Mood normal.   There is no tenderness palpation in the upper back region or lower back region to palpation, there is tenderness across both greater trochanters of the hip. Negative straight leg raising bilaterally Normal lower extremity strength bilaterally Ambulates without assistive device no evidence of toe drag or knee instability Osteoarthritic changes bilateral hands at PIPs and DIPs with Heberden's nodules.        Assessment & Plan:  #1.  Chronic widespread pain history of fibromyalgia would continue duloxetine, she is weaning off pregabalin, she is concerned about mental status changes which may or may  not be related.  Advise community aquatic exercise program 2.  Hand osteoarthritis continue diclofenac gel 3 times a day 3.  Chronic low back pain no evidence of sciatica, advised community exercise program 4.  History of bilateral knee and hip replacements.  Doing well in regards to this.

## 2021-12-13 ENCOUNTER — Other Ambulatory Visit: Payer: Self-pay

## 2021-12-13 ENCOUNTER — Encounter: Payer: Self-pay | Admitting: Cardiovascular Disease

## 2021-12-13 ENCOUNTER — Ambulatory Visit: Payer: Medicare Other | Admitting: Cardiovascular Disease

## 2021-12-13 VITALS — BP 100/60 | HR 64 | Ht 66.0 in | Wt 259.2 lb

## 2021-12-13 DIAGNOSIS — I7300 Raynaud's syndrome without gangrene: Secondary | ICD-10-CM

## 2021-12-13 DIAGNOSIS — E785 Hyperlipidemia, unspecified: Secondary | ICD-10-CM | POA: Diagnosis not present

## 2021-12-13 DIAGNOSIS — I2510 Atherosclerotic heart disease of native coronary artery without angina pectoris: Secondary | ICD-10-CM

## 2021-12-13 DIAGNOSIS — G4733 Obstructive sleep apnea (adult) (pediatric): Secondary | ICD-10-CM

## 2021-12-13 DIAGNOSIS — I10 Essential (primary) hypertension: Secondary | ICD-10-CM

## 2021-12-13 NOTE — Patient Instructions (Signed)
Medication Instructions:  ?The current medical regimen is effective;  continue present plan and medications as directed. Please refer to the Current Medication list given to you today.  ? ?*If you need a refill on your cardiac medications before your next appointment, please call your pharmacy* ? ?Lab Work:   Testing/Procedures:  ?NONE    NONE ? ?Follow-Up: ?Your next appointment:  12 month(s) In Person with Thomas Kelly, MD    ? ?Please call our office 2 months in advance to schedule this appointment  ? ?At [facility], you and your health needs are our priority.  As part of our continuing mission to provide you with exceptional heart care, we have created designated Provider Care Teams.  These Care Teams include your primary Cardiologist (physician) and Advanced Practice Providers (APPs -  Physician Assistants and Nurse Practitioners) who all work together to provide you with the care you need, when you need it. ? ? ?

## 2021-12-13 NOTE — Progress Notes (Signed)
Patient ID: Terri Rodriguez, female   DOB: 12-Mar-1946, 76 y.o.   MRN: [id number]    Primary M.D.: Dr. Shanon Ace  HPI: Terri Rodriguez is a 76 y.o. female who presents to the office today for 14 month follow-up cardiology evaluation.  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 Cardiac catheterization demonstrated a 30% tubular narrowing of the proximal LAD. She has a history of probable Raynaud's with a remote history of digital ischemia/spasm. She has been aggressively treated with medical therapy both for potential coronary vasospasm as well as lipid-lowering therapy for treatment methods to optimize endothelial function and blood pressure. She does have significant arthritic issues with degenerative disc disease and has undergone surgery involving L3-L4, L4-L5 by Dr. Sherwood Gambler. She also is status post bilateral knee surgery as well as left hip replacement. She has a history of obstructive sleep apnea on CPAP therapy, history of osteoarthritis which has been progressive. There also is a history of rosacea.    She sustained a left radius fracture and required open reduction and internal fixation in April 2015.  In August 2015, she underwent right hip replacement.  Her arthritis has limited her activity.  She has a history of obstructive sleep apnea and admits to 100% CPAP use.  She is unaware of breakthrough snoring.  She denies excessive daytime sleepiness.  When I saw her in April 2019 she denied any recurrent episodes of chest tightness or pressure. She denied any Raynaud's phenomenon. She was unaware of any palpitations.  She continues to have neuropathy in nerve pain issues for which she is on Cymbalta and recently was started on pregabalin with improvement.  There is no shortness of breath, chest pain, or digital vasospasm on amlodipine 5 mg, ramipril 10 mg, and carvedilol 3.125 mg twice a day.  She is on atorvastatin 20 mg. Lipid studies in   2018 showed a total cholesterol 165, HDL 76, LDLs 73, which had risen from 59 one year previously, and triglycerides are 108.    I saw her in July 2019 at which time she was remaining cardiac stable.  She is a grandmother of 2 girls who have congenital adrenal hypoplasia are doing well.  She continued to have arthritic issues with both hips and knees.  He was not successful with weight loss she has not been successful with weight loss.  She uses CPAP with 100% compliance with her DME company being [employer].   She was seen October 2020.  At that time she denied any chest pain or shortness of breath.  She denies any Raynaud's symptoms.  She has been stable on a medical regimen to reduce potential for vasospasm as well as improve endothelial function including amlodipine 5 mg, ramipril 10 mg and carvedilol 3.125 mg.  Her blood pressure has been stable.  She is continue to be on atorvastatin 20 mg for hyperlipidemia.  She continues to have significant pain from her chronic arthritic issues and was on Cymbalta for nerve pain.  Recently she has been bothered by arthritis in her neck.  She has seen Dr. Sherwood Gambler.  He does have issues with both knees, both hips, and DJD of her back.  She had 100% compliance with CPAP.    I last saw her on October 21, 2020.  Over the prior year she continued to do well.  She was having issues with arthritic pain leading to neuropathy for which she has been taking gabapentin.  She had seen Dr. Sherwood Gambler earlier this year but he since has retired.  She denies any chest pain or shortness of breath.  She continues to be on atorvastatin 20 mg for hyperlipidemia.  On medical therapy she has not had any Raynaud's symptoms.  She has been on amlodipine 5 mg, ramipril 10 mg and carvedilol for hypertension and improvement in function..  She recently saw Dr. Regis Bill who checked laboratory on October 15, 2020.  LDL cholesterol was excellent at 57.  She had minimal AST elevation at 47 and  ALT at 31.  Dr. Regis Bill will be rechecking laboratory in February 2022 and discussed with her the possibility of ultrasound if they remain elevated.  Since I last saw her, she denies any chest pain or shortness of breath.  She denies any edema.  She continues to have arthritic issues.  She uses CPAP with 100% compliance which is followed by Dr. Annamaria Boots.  Download from December 8 through December 10, 2021 showed average usage over 10 hours per night with AHI 2.6.  Her 95th percentile pressure was 13.9 with maximum average pressure 15.1 cm of water.  She had laboratory in August 2022 which showed stable hemoglobin and hematocrit.  Renal function was stable with creatinine 0.77.  TSH was normal at 1.58.  Total cholesterol 152 triglycerides 61 HDL 72 LDL 67.  LFTs are normal.  She presents for evaluation.  Past Medical History:  Diagnosis Date   ALLERGIC RHINITIS    Anginal pain ([place])    Blood transfusion without reported diagnosis    Cataract    small right   Cholelithiasis 06/09/2008   gallstones 2009 on ct scan   Complication of anesthesia 2012   went to ICU after bilateral knees , unstable vital signs oct 2012, has surgery since did ok   Coronary artery disease    30 % narrowing lad   Diverticulitis    EPICONDYLITIS, LATERAL    osteoarthritis, previous joint replacements   Fall 10/2018   GERD    Hepatic hemangioma 06/09/08   Hiatal hernia 03/2000   Hx of adenomatous polyp of colon 06/05/08   Hx of cardiac catheterization 2008    clean coronarys DrKelly    Hx of chest pain 2003   neg cath remot hx of narrowwing lad in 2003 nl 2008, none in many years   HYPERLIPIDEMIA    IBS (irritable bowel syndrome)    LEUKOPENIA, CHRONIC    Mononucleosis 1963   RAYNAUD'S SYNDROME, HX OF    improved after cardiac meds initiated   Rosacea    facial   Sleep apnea    SLEEP APNEA, OBSTRUCTIVE    cpap, settings "automatic" settings 11-12   Subacute thyroiditis    UNSPECIFIED ANEMIA     Past Surgical  History:  Procedure Laterality Date   ABDOMINAL HYSTERECTOMY  1993   bso   BACK SURGERY  2008   l 3 to l4 l4 to l5   BARTHOLIN GLAND CYST EXCISION Right 09/09/2019   Procedure: EXCISION OF VULVAR MASS TIMES TWO;  Surgeon: Megan Salon, MD;  Location: [place] [facility];  Service: Gynecology;  Laterality: Right;   CARDIAC CATHETERIZATION  04/07/2007   Noncritical coronary artery disease. Contiue medical therapy.   CARDIAC CATHETERIZATION  12/03/2007   Normal LV function. Mild angiographic mitral valve prolapse. Normal coronary arteries.   CARDIOVASCULAR STRESS TEST  11/09/2007   Moderate ischemia in Mid Anterior, Mid Anteroseptal, Apical Anterior, and Apical Septal regions. EKG negative for  ischemia.   [place]   dental implants  11/2002,11/2011,11/2012   dental implants     x 6   DENTAL SURGERY     DILATION AND CURETTAGE OF UTERUS     d and e 1988 and 1990   JOINT REPLACEMENT  09/21/2011   bilateral knee   KNEE ARTHROSCOPY     bilateral   lumbar surgery fixation with disc replacement  10/08   Left   NASAL SEPTOPLASTY W/ TURBINOPLASTY  05/2000   NOCTURNAL POLYSOMNOGRAM  12/31/2006   Severe obstructive sleep apnea. AHI-87/hr   REPLACEMENT TOTAL KNEE  2012   bilateral   TONSILLECTOMY  1952   adenoids too   TOTAL HIP ARTHROPLASTY Left 10/21/2013   Procedure: LEFT TOTAL HIP ARTHROPLASTY ANTERIOR APPROACH;  Surgeon: [name], MD;  Location: [facility];  Service: Orthopedics;  Laterality: Left;   TOTAL HIP ARTHROPLASTY Right 07/16/2014   Procedure: RIGHT TOTAL HIP ARTHROPLASTY ANTERIOR APPROACH;  Surgeon: [name], MD;  Location: [facility];  Service: Orthopedics;  Laterality: Right;   TRANSTHORACIC ECHOCARDIOGRAM  11/09/2007   EF 16%, LV systolic function normal. Mild aortic root dilation.   WRIST SURGERY Left 04/03/14   ORIF "distal radial head fracture"    Allergies  Allergen Reactions   Codeine     nausea   Crab [Shellfish Allergy] Itching and  Nausea And Vomiting    Current Outpatient Medications  Medication Sig Dispense Refill   amLODipine (NORVASC) 5 MG tablet TAKE 1 TABLET BY MOUTH EVERY DAY 90 tablet 1   amoxicillin (AMOXIL) 500 MG capsule 2,000 mg. 1 hour prior to dental procedures.     aspirin 81 MG tablet Take 81 mg by mouth daily.     atorvastatin (LIPITOR) 20 MG tablet TAKE 1 TABLET BY MOUTH EVERY DAY AT 6 PM 90 tablet 2   Azelaic Acid 15 % cream Apply 1 application topically 2 (two) times daily. After skin is thoroughly washed and patted dry, gently but thoroughly massage a thin film of azelaic acid cream into the affected area t daily,after shower.     carvedilol (COREG) 3.125 MG tablet Take 1 tablet by mouth 2 times daily with a meal. 180 tablet 1   Cholecalciferol (VITAMIN D3) 2000 UNITS TABS Take 1 tablet by mouth daily.     clindamycin (CLEOCIN T) 1 % external solution 2 (two) times daily as needed.  3   clobetasol (TEMOVATE) 0.05 % external solution Apply topically.     diclofenac sodium (VOLTAREN) 1 % GEL Apply 2-4 g topically 4 (four) times daily. As needed for  Arthritis  Pain. 4 Tube 1   DULoxetine (CYMBALTA) 60 MG capsule Cymbalta 60 mg capsule,delayed release  Take 1 capsule every day by oral route.     estradiol (ESTRACE) 0.5 MG tablet 1/2 tab daily (Patient taking differently: 1/2 tab every other day) 45 tablet 4   Fexofenadine HCl (ALLEGRA ALLERGY PO) Allegra Allergy     fluocinonide (LIDEX) 0.05 % external solution Apply AA on scalp PRN  0   ketoconazole (NIZORAL) 2 % cream SMARTSIG:1 Application Topical 1 to 2 Times Daily     metroNIDAZOLE (METROGEL) 0.75 % gel APPLY TO AFFECTED AREA TWICE A DAY     Multiple Vitamins-Minerals (CENTRUM ADULTS PO) Take 1 tablet by mouth daily.     nitroGLYCERIN (NITROSTAT) 0.4 MG SL tablet PLACE 1 TABLET UNDER THE TONGUE EVERY 5 MINUTES AS NEEDED FOR CHEST PAIN 25 tablet 3   omeprazole (  PRILOSEC) 20 MG capsule Take 20 mg by mouth in the morning and at bedtime.      pregabalin (LYRICA) 50 MG capsule Take orally weaning with 155m ; !00 mg 50 mg 100 mg , per day  for one week ,then decrease to 100 -50 -50 mg per day, then 50 mg tid or as directed 42 capsule 1   ramipril (ALTACE) 10 MG capsule TAKE 1 CAPSULE BY MOUTH EVERY DAY 90 capsule 1   valACYclovir (VALTREX) 1000 MG tablet TAKE 2 TABLET BY MOUTH Q12 hours X 2 DOSES WITH FEVER BLISTERS 30 tablet 1   celecoxib (CELEBREX) 200 MG capsule Take 200 mg by mouth daily.     chlorhexidine (PERIDEX) 0.12 % solution      ciprofloxacin (CIPRO) 500 MG tablet Take 1 tablet (500 mg total) by mouth 2 (two) times daily. 14 tablet 0   No current facility-administered medications for this visit.    Social History   Socioeconomic History   Marital status: Married    Spouse name: Not on file   Number of children: 1   Years of education: Not on file   Highest education level: Professional school degree (e.g., MD, DDS, DVM, JD)  Occupational History   Occupation: Physician  Tobacco Use   Smoking status: Never   Smokeless tobacco: Never  Vaping Use   Vaping Use: Never used  Substance and Sexual Activity   Alcohol use: Not Currently    Alcohol/week: 14.0 standard drinks    Types: 14 Glasses of wine per week    Comment: wine 2 glasses per day   Drug use: No   Sexual activity: Yes    Partners: Male    Birth control/protection: Surgical    Comment: TAH/BSO  Other Topics Concern   Not on file  Social History Narrative   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   Grown DTR   HLabetteof 2   No pets   Helps with [employer]   Social Determinants of Health   Financial Resource Strain: Low Risk    Difficulty of Paying Living Expenses: Not hard at all  Food Insecurity: No Food Insecurity   Worried About RCharity fundraiserin the Last Year: Never true   RSalinasin the Last Year: Never true  Transportation Needs: No Transportation Needs   Lack of Transportation (Medical): No   Lack of  Transportation (Non-Medical): No  Physical Activity: Inactive   Days of Exercise per Week: 0 days   Minutes of Exercise per Session: 0 min  Stress: No Stress Concern Present   Feeling of Stress : Only a little  Social Connections: Unknown   Frequency of Communication with Friends and Family: More than three times a week   Frequency of Social Gatherings with Friends and Family: Twice a week   Attends Religious Services: Patient refused   AMarine scientistor Organizations: No   Attends CMusic therapist Never   Marital Status: Married  IHuman resources officerViolence: Not At Risk   Fear of Current or Ex-Partner: No   Emotionally Abused: No   Physically Abused: No   Sexually Abused: No    Family History  Problem Relation Age of Onset   Rheum arthritis Mother    Diabetes Mother    Heart failure Mother    Thrombocytopenia Mother    Sleep apnea Mother    Ulcers Mother        PUD   Deep  vein thrombosis Father    Pulmonary embolism Father    Osteoarthritis Father    Atrial fibrillation Father    Dementia Father    Sleep apnea Father    Sleep apnea Brother    Obesity Brother    Sleep apnea Brother    Obesity Brother    Colon cancer Maternal Aunt 68   Colon cancer Maternal Aunt 90   Pancreatic cancer Other    Uterine cancer Other    Leukemia Other    Inflammatory bowel disease Other        aunt   Congenital adrenal hyperplasia Grandchild    Atrial fibrillation Brother    Esophageal cancer Neg Hx    Rectal cancer Neg Hx    Stomach cancer Neg Hx     ROS General: Negative; No fevers, chills, or night sweats; positive for weight gain  HEENT: Negative; No changes in vision or hearing, sinus congestion, difficulty swallowing Pulmonary: Negative; No cough, wheezing, shortness of breath, hemoptysis Cardiovascular: Negative; No chest pain, presyncope, syncope, palpitations GI: Positive for GERD No nausea, vomiting, diarrhea, or abdominal pain GU: Negative; No  dysuria, hematuria, or difficulty voiding Musculoskeletal: Positive for lumbar disc surgery, bilateral knee replacements and bilateral hip replacements; now with neck discomfort from cervical disc disease Hematologic/Oncology: Negative; no easy bruising, bleeding Endocrine: Negative; no heat/cold intolerance; no diabetes Neuro: Negative; no changes in balance, headaches Skin: Negative; No rashes or skin lesions Psychiatric: Negative; No behavioral problems, depression Sleep: Positive for obstructive sleep apnea on CPAP therapy; No snoring, daytime sleepiness, hypersomnolence, bruxism, restless legs, hypnogognic hallucinations, no cataplexy Other comprehensive 14 point system review is negative.   PE BP 100/60 (BP Location: Right Arm, Patient Position: Sitting)    Pulse 64    Ht 5' 6"  (1.676 m)    Wt 259 lb 3.2 oz (117.6 kg)    SpO2 98%    BMI 41.84 kg/m    Repeat blood pressure by me was 118/70 supine and 112/66 standing  Wt Readings from Last 3 Encounters:  12/28/21 256 lb (116.1 kg)  12/23/21 259 lb (117.5 kg)  12/13/21 259 lb 3.2 oz (117.6 kg)   General: Alert, oriented, no distress.  Skin: normal turgor, no rashes, warm and dry HEENT: Normocephalic, atraumatic. Pupils equal round and reactive to light; sclera anicteric; extraocular muscles intact;  Nose without nasal septal hypertrophy Mouth/Parynx benign; Mallinpatti scale 3 Neck: No JVD, no carotid bruits; normal carotid upstroke Lungs: clear to ausculatation and percussion; no wheezing or rales Chest wall: without tenderness to palpitation Heart: PMI not displaced, RRR, s1 s2 normal, 1/6 systolic murmur, no diastolic murmur, no rubs, gallops, thrills, or heaves Abdomen: soft, nontender; no hepatosplenomehaly, BS+; abdominal aorta nontender and not dilated by palpation. Back: no CVA tenderness Pulses 2+ Musculoskeletal: full range of motion, normal strength, no joint deformities Extremities: no clubbing cyanosis or edema,  Homan's sign negative  Neurologic: grossly nonfocal; Cranial nerves grossly wnl Psychologic: Normal mood and affect    December 13, 2021 ECG (independently read by me):  NSR at 64, IRBBB, T wave abnormality  October 21, 2020 ECG (independently read by me): Normal sinus rhythm at 72 bpm, incomplete right bundle branch block.  No ectopy.  Normal intervals.  October 2020 ECG (independently read by me): Normal sinus rhythm at 79 bpm.  Incomplete right bundle branch block.  No ectopy.  July 2019 ECG (independently read by me): Normal sinus rhythm at 70 bpm.  T wave abnormality, nonspecific V1 V2.  Normal  intervals.  No ectopy.  April 2018 ECG (independently read by me): Normal sinus rhythm at 65 bpm.  Nonspecific T waves.  Normal intervals.  March 2017 ECG (independently read by me): Normal sinus rhythm at 81 bpm isolate a PVC.  Nonspecific T-wave changes with previously noted T-wave inversion in V1, V2.  QTc interval 487 ms.  February 2016 ECG (independently read by me): Normal sinus rhythm at 81 bpm.  Previously noted T-wave inversion V1 and V2 which is old and has been documented for over 10 years.  Mild QTC increased at 469 ms  February 2015 ECG (independently read by me): Sinus rhythm at 76 beats per minute period. T wave inversion in V1 V2 which is old.  LABS: BMP Latest Ref Rng & Units 08/04/2021 01/05/2021 10/15/2020  Glucose 70 - 99 mg/dL 99 96 108(H)  BUN 6 - 23 mg/dL 17 19 19   Creatinine 0.40 - 1.20 mg/dL 0.77 0.65 0.70  BUN/Creat Ratio 6 - 22 (calc) - - NOT APPLICABLE  Sodium 893 - 145 mEq/L 141 140 141  Potassium 3.5 - 5.1 mEq/L 4.2 4.3 4.4  Chloride 96 - 112 mEq/L 108 104 105  CO2 19 - 32 mEq/L 26 31 27   Calcium 8.4 - 10.5 mg/dL 9.5 9.7 9.3   Hepatic Function Latest Ref Rng & Units 08/04/2021 04/13/2021 01/05/2021  Total Protein 6.0 - 8.3 g/dL 6.5 6.3 6.7  Albumin 3.5 - 5.2 g/dL 4.1 4.2 4.4  AST 0 - 37 U/L 40(H) 46(H) 38(H)  ALT 0 - 35 U/L 28 40(H) 28  Alk Phosphatase 39 - 117  U/L 71 74 71  Total Bilirubin 0.2 - 1.2 mg/dL 0.5 0.5 0.5  Bilirubin, Direct 0.0 - 0.3 mg/dL 0.1 0.1 0.1    CBC Latest Ref Rng & Units 08/04/2021 04/13/2021 10/15/2020  WBC 4.0 - 10.5 K/uL 3.1(L) 3.1(L) 3.3(L)  Hemoglobin 12.0 - 15.0 g/dL 12.4 12.4 12.2  Hematocrit 36.0 - 46.0 % 36.1 35.5(L) 36.6  Platelets 150.0 - 400.0 K/uL 194.0 187.0 199    Lab Results  Component Value Date   MCV 91.1 08/04/2021   MCV 93.1 04/13/2021   MCV 97.6 10/15/2020    Lab Results  Component Value Date   TSH 1.58 08/04/2021   Lab Results  Component Value Date   HGBA1C 5.7 08/04/2021   Lipid Panel     Component Value Date/Time   CHOL 152 08/04/2021 0905   CHOL 155 06/17/2019 1109   TRIG 61.0 08/04/2021 0905   HDL 72.70 08/04/2021 0905   HDL 78 06/17/2019 1109   CHOLHDL 2 08/04/2021 0905   VLDL 12.2 08/04/2021 0905   LDLCALC 67 08/04/2021 0905   LDLCALC 57 10/15/2020 0922     RADIOLOGY: No results found.  IMPRESSION:  1. Essential hypertension   2. Hyperlipidemia LDL goal <70   3. Mild CAD   4. OSA (obstructive sleep apnea)   5. Raynaud's phenomenon without gangrene   6. 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 She underwent repeat cardiac catheterization in 2008 prior to back surgery after nuclear perfusion study suggested possible ischemia.  Coronary angiography showed normalization of coronary arteries without significant obstruction.  She has continued to well without recurrent anginal symptomatology.  She has a history of digital ischemia digestive of Raynaud's symptomatology but this has completely stabilized with her medical  regimen now consisting of amlodipine 5 mg, ramipril 10 mg, carvedilol 3.125 mg.  She initially was started on this regimen and attempt to reduce vasospasm as well as improve endothelial function.   Presently, her blood pressure is stable and she continues to be on a regimen of ramipril 10 mg as well as amlodipine 5 mg.  She is no longer having any digital vasospasm suggesting appropriate vasodilation central endothelial function improvement.  She continues to be on carvedilol 3.125 mg twice a day.  Her ECG is stable.  She has incomplete right bundle branch block.  She is on atorvastatin 20 mg for hyperlipidemia and laboratory performed by Dr. Shanon Ace on August 04, 2021 was reviewed and LDL cholesterol was 67.  LFTs had normalized.  She is on pregabalin for her neuropathy.  She continues to use CPAP with 100% compliance.  BMI is increased at 41.8 consistent with morbid obesity.  Weight loss and increased activity was recommended.  She is not having any edema.  I will see her in 1 year for reevaluation or sooner as needed.   Troy Sine, MD, [facility]  12/29/2021 8:02 AM

## 2021-12-13 NOTE — Progress Notes (Incomplete)
Patient ID: Terri Rodriguez, female   DOB: 1946/04/15, 76 y.o.   MRN: [id number]    Primary M.D.: Dr. Shanon Ace  HPI: Terri Rodriguez is a 76 y.o. female who presents to the office today for 1 year follow-up cardiology evaluation.  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 Cardiac catheterization demonstrated a 30% tubular narrowing of the proximal LAD. She has a history of probable Raynaud's with a remote history of digital ischemia/spasm. She has been aggressively treated with medical therapy both for potential coronary vasospasm as well as lipid-lowering therapy for treatment methods to optimize endothelial function and blood pressure. She does have significant arthritic issues with degenerative disc disease and has undergone surgery involving L3-L4, L4-L5 by Dr. Sherwood Gambler. She also is status post bilateral knee surgery as well as left hip replacement. She has a history of obstructive sleep apnea on CPAP therapy, history of osteoarthritis which has been progressive. There also is a history of rosacea.    She sustained a left radius fracture and required open reduction and internal fixation in April 2015.  In August 2015, she underwent right hip replacement.  Her arthritis has limited her activity.  She has a history of obstructive sleep apnea and admits to 100% CPAP use.  She is unaware of breakthrough snoring.  She denies excessive daytime sleepiness.  When I saw her in April 2019 she denied any recurrent episodes of chest tightness or pressure. She denied any Raynaud's phenomenon. She was unaware of any palpitations.  She continues to have neuropathy in nerve pain issues for which she is on Cymbalta and recently was started on pregabalin with improvement.  There is no shortness of breath, chest pain, or digital vasospasm on amlodipine 5 mg, ramipril 10 mg, and carvedilol 3.125 mg twice a day.  She is on atorvastatin 20 mg. Lipid studies in   2018 showed a total cholesterol 165, HDL 76, LDLs 73, which had risen from 59 one year previously, and triglycerides are 108.    I saw her in July 2019 at which time she was remaining cardiac stable.  She is a grandmother of 2 girls who have congenital adrenal hypoplasia are doing well.  She continued to have arthritic issues with both hips and knees.  He was not successful with weight loss she has not been successful with weight loss.  She uses CPAP with 100% compliance with her DME company being [employer].   She was last seen October 2020.  At that time she denied any chest pain or shortness of breath.  She denies any Raynaud's symptoms.  She has been stable on a medical regimen to reduce potential for vasospasm as well as improve endothelial function including amlodipine 5 mg, ramipril 10 mg and carvedilol 3.125 mg.  Her blood pressure has been stable.  She is continue to be on atorvastatin 20 mg for hyperlipidemia.  She continues to have significant pain from her chronic arthritic issues and was on Cymbalta for nerve pain.  Recently she has been bothered by arthritis in her neck.  She has seen Dr. Sherwood Gambler.  He does have issues with both knees, both hips, and DJD of her back.  She had 100% compliance with CPAP.    Over the past year, she has continued to do well.  She continues to have issues with arthritic pain leading to neuropathy for which she takes gabapentin.  She had seen Dr. Sherwood Gambler earlier this  year but he since has retired.  She denies any chest pain or shortness of breath.  She continues to be on atorvastatin 20 mg for hyperlipidemia.  On medical therapy she has not had any Raynaud's symptoms.  She has been on amlodipine 5 mg, ramipril 10 mg and carvedilol for hypertension and improvement in function..  She recently saw Dr. Regis Bill who checked laboratory on October 15, 2020.  LDL cholesterol was excellent at 57.  She had minimal AST elevation at 47 and ALT at 31.  Dr. Regis Bill will be  rechecking laboratory in February and discussed with her the possibility of ultrasound if they remain elevated.  Past Medical History:  Diagnosis Date   ALLERGIC RHINITIS    Anginal pain ([place])    Blood transfusion without reported diagnosis    Cataract    small right   Cholelithiasis 06/09/2008   gallstones 2009 on ct scan   Complication of anesthesia 2012   went to ICU after bilateral knees , unstable vital signs oct 2012, has surgery since did ok   Coronary artery disease    30 % narrowing lad   Diverticulitis    EPICONDYLITIS, LATERAL    osteoarthritis, previous joint replacements   Fall 10/2018   GERD    Hepatic hemangioma 06/09/08   Hiatal hernia 03/2000   Hx of adenomatous polyp of colon 06/05/08   Hx of cardiac catheterization 2008    clean coronarys DrKelly    Hx of chest pain 2003   neg cath remot hx of narrowwing lad in 2003 nl 2008, none in many years   HYPERLIPIDEMIA    IBS (irritable bowel syndrome)    LEUKOPENIA, CHRONIC    Mononucleosis 1963   RAYNAUD'S SYNDROME, HX OF    improved after cardiac meds initiated   Rosacea    facial   Sleep apnea    SLEEP APNEA, OBSTRUCTIVE    cpap, settings "automatic" settings 11-12   Subacute thyroiditis    UNSPECIFIED ANEMIA     Past Surgical History:  Procedure Laterality Date   ABDOMINAL HYSTERECTOMY  1993   bso   BACK SURGERY  2008   l 3 to l4 l4 to l5   BARTHOLIN GLAND CYST EXCISION Right 09/09/2019   Procedure: EXCISION OF VULVAR MASS TIMES TWO;  Surgeon: Megan Salon, MD;  Location: [place];  Service: Gynecology;  Laterality: Right;   CARDIAC CATHETERIZATION  04/07/2007   Noncritical coronary artery disease. Contiue medical therapy.   CARDIAC CATHETERIZATION  12/03/2007   Normal LV function. Mild angiographic mitral valve prolapse. Normal coronary arteries.   CARDIOVASCULAR STRESS TEST  11/09/2007   Moderate ischemia in Mid Anterior, Mid Anteroseptal, Apical Anterior, and Apical Septal regions.  EKG negative for ischemia.   [place]   dental implants  11/2002,11/2011,11/2012   dental implants     x 6   DENTAL SURGERY     DILATION AND CURETTAGE OF UTERUS     d and e 1988 and 1990   JOINT REPLACEMENT  09/21/2011   bilateral knee   KNEE ARTHROSCOPY     bilateral   lumbar surgery fixation with disc replacement  10/08   Left   NASAL SEPTOPLASTY W/ TURBINOPLASTY  05/2000   NOCTURNAL POLYSOMNOGRAM  12/31/2006   Severe obstructive sleep apnea. AHI-87/hr   REPLACEMENT TOTAL KNEE  2012   bilateral   TONSILLECTOMY  1952   adenoids too   TOTAL HIP ARTHROPLASTY Left 10/21/2013   Procedure:  LEFT TOTAL HIP ARTHROPLASTY ANTERIOR APPROACH;  Surgeon: [name], MD;  Location: [facility];  Service: Orthopedics;  Laterality: Left;   TOTAL HIP ARTHROPLASTY Right 07/16/2014   Procedure: RIGHT TOTAL HIP ARTHROPLASTY ANTERIOR APPROACH;  Surgeon: [name], MD;  Location: [facility];  Service: Orthopedics;  Laterality: Right;   TRANSTHORACIC ECHOCARDIOGRAM  11/09/2007   EF 07%, LV systolic function normal. Mild aortic root dilation.   WRIST SURGERY Left 04/03/14   ORIF "distal radial head fracture"    Allergies  Allergen Reactions   Codeine     nausea   Crab [Shellfish Allergy] Itching and Nausea And Vomiting    Current Outpatient Medications  Medication Sig Dispense Refill   amLODipine (NORVASC) 5 MG tablet TAKE 1 TABLET BY MOUTH EVERY DAY 90 tablet 1   amoxicillin (AMOXIL) 500 MG capsule 2,000 mg. 1 hour prior to dental procedures.     aspirin 81 MG tablet Take 81 mg by mouth daily.     atorvastatin (LIPITOR) 20 MG tablet TAKE 1 TABLET BY MOUTH EVERY DAY AT 6 PM 90 tablet 2   Azelaic Acid 15 % cream Apply 1 application topically 2 (two) times daily. After skin is thoroughly washed and patted dry, gently but thoroughly massage a thin film of azelaic acid cream into the affected area t daily,after shower.     carvedilol (COREG) 3.125 MG tablet Take 1 tablet by mouth 2  times daily with a meal. 180 tablet 1   Cholecalciferol (VITAMIN D3) 2000 UNITS TABS Take 1 tablet by mouth daily.     clindamycin (CLEOCIN T) 1 % external solution 2 (two) times daily as needed.  3   clobetasol (TEMOVATE) 0.05 % external solution Apply topically.     diclofenac sodium (VOLTAREN) 1 % GEL Apply 2-4 g topically 4 (four) times daily. As needed for  Arthritis  Pain. 4 Tube 1   DULoxetine (CYMBALTA) 60 MG capsule Cymbalta 60 mg capsule,delayed release  Take 1 capsule every day by oral route.     estradiol (ESTRACE) 0.5 MG tablet 1/2 tab daily (Patient taking differently: 1/2 tab every other day) 45 tablet 4   Fexofenadine HCl (ALLEGRA ALLERGY PO) Allegra Allergy     fluocinonide (LIDEX) 0.05 % external solution Apply AA on scalp PRN  0   ketoconazole (NIZORAL) 2 % cream SMARTSIG:1 Application Topical 1 to 2 Times Daily     metroNIDAZOLE (METROGEL) 0.75 % gel APPLY TO AFFECTED AREA TWICE A DAY     Multiple Vitamins-Minerals (CENTRUM ADULTS PO) Take 1 tablet by mouth daily.     nitroGLYCERIN (NITROSTAT) 0.4 MG SL tablet PLACE 1 TABLET UNDER THE TONGUE EVERY 5 MINUTES AS NEEDED FOR CHEST PAIN 25 tablet 3   omeprazole (PRILOSEC) 20 MG capsule Take 20 mg by mouth in the morning and at bedtime.     pregabalin (LYRICA) 100 MG capsule TAKE 1 CAPSULE BY MOUTH 3 TIMES DAILY 90 capsule 0   pregabalin (LYRICA) 50 MG capsule Take orally weaning with 135m ; !00 mg 50 mg 100 mg , per day  for one week ,then decrease to 100 -50 -50 mg per day, then 50 mg tid or as directed 42 capsule 1   ramipril (ALTACE) 10 MG capsule TAKE 1 CAPSULE BY MOUTH EVERY DAY 90 capsule 1   valACYclovir (VALTREX) 1000 MG tablet TAKE 2 TABLET BY MOUTH Q12 hours X 2 DOSES WITH FEVER BLISTERS 30 tablet 1   chlorhexidine (PERIDEX) 0.12 % solution SMARTSIG:By Mouth (  Patient not taking: Reported on 12/13/2021)     No current facility-administered medications for this visit.    Social History   Socioeconomic History    Marital status: Married    Spouse name: Not on file   Number of children: 1   Years of education: Not on file   Highest education level: Not on file  Occupational History   Occupation: Physician  Tobacco Use   Smoking status: Never   Smokeless tobacco: Never  Vaping Use   Vaping Use: Never used  Substance and Sexual Activity   Alcohol use: Not Currently    Alcohol/week: 14.0 standard drinks    Types: 14 Glasses of wine per week    Comment: wine 2 glasses per day   Drug use: No   Sexual activity: Yes    Partners: Male    Birth control/protection: Surgical    Comment: TAH/BSO  Other Topics Concern   Not on file  Social History Narrative   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   Social Determinants of Health   Financial Resource Strain: Low Risk    Difficulty of Paying Living Expenses: Not hard at all  Food Insecurity: No Food Insecurity   Worried About Charity fundraiser in the Last Year: Never true   [place] in the Last Year: Never true  Transportation Needs: No Transportation Needs   Lack of Transportation (Medical): No   Lack of Transportation (Non-Medical): No  Physical Activity: Inactive   Days of Exercise per Week: 0 days   Minutes of Exercise per Session: 0 min  Stress: No Stress Concern Present   Feeling of Stress : Not at all  Social Connections: Moderately Integrated   Frequency of Communication with Friends and Family: More than three times a week   Frequency of Social Gatherings with Friends and Family: More than three times a week   Attends Religious Services: 1 to 4 times per year   Active Member of [employer] or Organizations: No   Attends Music therapist: Never   Marital Status: Married  Human resources officer Violence: Not At Risk   Fear of Current or Ex-Partner: No   Emotionally Abused: No   Physically Abused: No   Sexually Abused: No    Family History  Problem  Relation Age of Onset   Rheum arthritis Mother    Diabetes Mother    Heart failure Mother    Thrombocytopenia Mother    Sleep apnea Mother    Ulcers Mother        PUD   Deep vein thrombosis Father    Pulmonary embolism Father    Osteoarthritis Father    Atrial fibrillation Father    Dementia Father    Sleep apnea Father    Sleep apnea Brother    Obesity Brother    Sleep apnea Brother    Obesity Brother    Colon cancer Maternal Aunt 59   Colon cancer Maternal Aunt 90   Pancreatic cancer Other    Uterine cancer Other    Leukemia Other    Inflammatory bowel disease Other        aunt   Congenital adrenal hyperplasia Grandchild    Atrial fibrillation Brother    Esophageal cancer Neg Hx    Rectal cancer Neg Hx    Stomach cancer Neg Hx     ROS General:  Negative; No fevers, chills, or night sweats; positive for weight gain  HEENT: Negative; No changes in vision or hearing, sinus congestion, difficulty swallowing Pulmonary: Negative; No cough, wheezing, shortness of breath, hemoptysis Cardiovascular: Negative; No chest pain, presyncope, syncope, palpitations GI: Positive for GERD No nausea, vomiting, diarrhea, or abdominal pain GU: Negative; No dysuria, hematuria, or difficulty voiding Musculoskeletal: Positive for lumbar disc surgery, bilateral knee replacements and bilateral hip replacements; now with neck discomfort from cervical disc disease Hematologic/Oncology: Negative; no easy bruising, bleeding Endocrine: Negative; no heat/cold intolerance; no diabetes Neuro: Negative; no changes in balance, headaches Skin: Negative; No rashes or skin lesions Psychiatric: Negative; No behavioral problems, depression Sleep: Positive for obstructive sleep apnea on CPAP therapy; No snoring, daytime sleepiness, hypersomnolence, bruxism, restless legs, hypnogognic hallucinations, no cataplexy Other comprehensive 14 point system review is negative.   PE There were no vitals taken for this  visit.   Repeat blood pressure by me was 118/64  Wt Readings from Last 3 Encounters:  12/09/21 255 lb 3.2 oz (115.8 kg)  10/14/21 257 lb (116.6 kg)  10/07/21 257 lb (116.6 kg)   General: Alert, oriented, no distress.  Skin: normal turgor, no rashes, warm and dry HEENT: Normocephalic, atraumatic. Pupils equal round and reactive to light; sclera anicteric; extraocular muscles intact;  Nose without nasal septal hypertrophy Mouth/Parynx benign; Mallinpatti scale 3 Neck: No JVD, no carotid bruits; normal carotid upstroke Lungs: clear to ausculatation and percussion; no wheezing or rales Chest wall: without tenderness to palpitation Heart: PMI not displaced, RRR, s1 s2 normal, 1/6 systolic murmur, no diastolic murmur, no rubs, gallops, thrills, or heaves Abdomen: soft, nontender; no hepatosplenomehaly, BS+; abdominal aorta nontender and not dilated by palpation. Back: no CVA tenderness Pulses 2+ Musculoskeletal: full range of motion, normal strength, no joint deformities Extremities: Osteoarthritis with significant Heberden's nodes; no clubbing cyanosis or edema, Homan's sign negative  Neurologic: grossly nonfocal; Cranial nerves grossly wnl Psychologic: Normal mood and affect  December 13, 2021 ECG (independently read by me): NSR at 64, , IRBBB, QTc 455 msec  October 21, 2020 ECG (independently read by me): Normal sinus rhythm at 72 bpm, incomplete right bundle branch block.  No ectopy.  Normal intervals.  October 2020 ECG (independently read by me): Normal sinus rhythm at 79 bpm.  Incomplete right bundle branch block.  No ectopy.  July 2019 ECG (independently read by me): Normal sinus rhythm at 70 bpm.  T wave abnormality, nonspecific V1 V2.  Normal intervals.  No ectopy.  April 2018 ECG (independently read by me): Normal sinus rhythm at 65 bpm.  Nonspecific T waves.  Normal intervals.  March 2017 ECG (independently read by me): Normal sinus rhythm at 81 bpm isolate a PVC.  Nonspecific  T-wave changes with previously noted T-wave inversion in V1, V2.  QTc interval 487 ms.  February 2016 ECG (independently read by me): Normal sinus rhythm at 81 bpm.  Previously noted T-wave inversion V1 and V2 which is old and has been documented for over 10 years.  Mild QTC increased at 469 ms  February 2015 ECG (independently read by me): Sinus rhythm at 76 beats per minute period. T wave inversion in V1 V2 which is old.  LABS: BMP Latest Ref Rng & Units 08/04/2021 01/05/2021 10/15/2020  Glucose 70 - 99 mg/dL 99 96 108(H)  BUN 6 - 23 mg/dL 17 19 19   Creatinine 0.40 - 1.20 mg/dL 0.77 0.65 0.70  BUN/Creat Ratio 6 - 22 (calc) - - NOT APPLICABLE  Sodium  135 - 145 mEq/L 141 140 141  Potassium 3.5 - 5.1 mEq/L 4.2 4.3 4.4  Chloride 96 - 112 mEq/L 108 104 105  CO2 19 - 32 mEq/L 26 31 27   Calcium 8.4 - 10.5 mg/dL 9.5 9.7 9.3   Hepatic Function Latest Ref Rng & Units 08/04/2021 04/13/2021 01/05/2021  Total Protein 6.0 - 8.3 g/dL 6.5 6.3 6.7  Albumin 3.5 - 5.2 g/dL 4.1 4.2 4.4  AST 0 - 37 U/L 40(H) 46(H) 38(H)  ALT 0 - 35 U/L 28 40(H) 28  Alk Phosphatase 39 - 117 U/L 71 74 71  Total Bilirubin 0.2 - 1.2 mg/dL 0.5 0.5 0.5  Bilirubin, Direct 0.0 - 0.3 mg/dL 0.1 0.1 0.1    CBC Latest Ref Rng & Units 08/04/2021 04/13/2021 10/15/2020  WBC 4.0 - 10.5 K/uL 3.1(L) 3.1(L) 3.3(L)  Hemoglobin 12.0 - 15.0 g/dL 12.4 12.4 12.2  Hematocrit 36.0 - 46.0 % 36.1 35.5(L) 36.6  Platelets 150.0 - 400.0 K/uL 194.0 187.0 199    Lab Results  Component Value Date   MCV 91.1 08/04/2021   MCV 93.1 04/13/2021   MCV 97.6 10/15/2020    Lab Results  Component Value Date   TSH 1.58 08/04/2021   Lab Results  Component Value Date   HGBA1C 5.7 08/04/2021   Lipid Panel     Component Value Date/Time   CHOL 152 08/04/2021 0905   CHOL 155 06/17/2019 1109   TRIG 61.0 08/04/2021 0905   HDL 72.70 08/04/2021 0905   HDL 78 06/17/2019 1109   CHOLHDL 2 08/04/2021 0905   VLDL 12.2 08/04/2021 0905   LDLCALC 67 08/04/2021  0905   LDLCALC 57 10/15/2020 0922     RADIOLOGY: No results found.  IMPRESSION:  No diagnosis found.   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 She underwent repeat cardiac catheterization in 2008 prior to back surgery after nuclear perfusion study raise the possibility of ischemia.  Coronary angiography showed normalization of coronary arteries without significant obstruction.  She has continued to well without recurrent anginal symptomatology.  She has a history of digital ischemia digestive of Raynaud's symptomatology but this has completely stabilized with her medical regimen now consisting of amlodipine 5 mg, ramipril 10 mg, carvedilol 3.125 mg.  She initially was started on this regimen and attempt to reduce vasospasm as well as improve endothelial function.  Over the past year she continues to be stable.  She denies any chest pain or shortness of breath.  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  At times if she keeps her legs dependent she does note some mild ankle edema but as long as she does elevate her legs portion of the day there is no edema.  She is tolerating atorvastatin 20 mg for hyperlipidemia.  Most recent LDL cholesterol is excellent at 57.  She had minimal AST and ALT elevation.  She does drink wine on a daily basis.  She continues to have issues with arthritis of her knees hips and back with compression on her nerves for which she takes pregabalin and she continues to take duloxetine.  Dr. Sherwood Gambler has retired and she will need in the future to reestablish with a neurosurgeon.  I gave her several names in the same group.  She continues to use CPAP.  As long as she is stable I will see her in 1  year for reevaluation   Troy Sine, MD, [facility]  12/13/2021 2:32 PM

## 2021-12-21 ENCOUNTER — Ambulatory Visit: Payer: Medicare Other | Admitting: Adult Health

## 2021-12-21 ENCOUNTER — Telehealth: Payer: Self-pay

## 2021-12-21 ENCOUNTER — Ambulatory Visit: Payer: Medicare Other | Admitting: Gastroenterology

## 2021-12-21 NOTE — Telephone Encounter (Signed)
PT called and left message.  Pt needs to schedule ERO with [place] to continue her treatments in the pool.

## 2021-12-23 ENCOUNTER — Encounter: Payer: Self-pay | Admitting: Gastroenterology

## 2021-12-23 ENCOUNTER — Other Ambulatory Visit: Payer: Medicare Other

## 2021-12-23 ENCOUNTER — Ambulatory Visit: Payer: Medicare Other | Admitting: Gastroenterology

## 2021-12-23 VITALS — BP 112/58 | HR 58 | Ht 63.5 in | Wt 259.0 lb

## 2021-12-23 DIAGNOSIS — R932 Abnormal findings on diagnostic imaging of liver and biliary tract: Secondary | ICD-10-CM

## 2021-12-23 DIAGNOSIS — R748 Abnormal levels of other serum enzymes: Secondary | ICD-10-CM

## 2021-12-23 DIAGNOSIS — K5900 Constipation, unspecified: Secondary | ICD-10-CM

## 2021-12-23 NOTE — Patient Instructions (Signed)
It was a pleasure to see you today.  I have recommended some labs and some imaging studies to evaluate your liver further.  Avoid alcohol will ultimately help your liver be as healthy as possible.   Given your constipation, I recommend that you use a daily dose of Miralax 17 grams. You may find this most helpful if you take it in the evening. Sit on the toilet in the morning after your cup of coffee and let's see if we can retrain your bowels.  I recommend that you eat at least 25-30 grams of fiber daily and drink at least 64 ounces of water daily. You will want to gradually increase the fiber in your diet to avoid bloating. You may increase the fiber through diet and through fiber supplements including psyllium and methycellulose.   Natural laxatives include prunes, apples, apricots, cherries, peaches, pears, aloe, rhubarb, kiwi, bananas, mango, papaya, and watermelon can also be very helpful for constipation. In particular, two kiwi a day has been show to cause less likely to cause bloating than prunes or psyllium.  As long as you have healthy kidneys, another options is using magnesium oxide supplements. I recommend starting with 500 mg daily. You could increase the dose to 1000 mg daily after one week if that doesn't seem to be helping.   I'd like to see you back in the office after your elastography and MRI.   Your provider has requested that you go to the basement level for lab work before leaving today. Press "B" on the elevator. The lab is located at the first door on the left as you exit the elevator.  You have been scheduled for an MRI at [facility] on 01-03-22. Your appointment will be after ultrasound. Please make certain not to have anything to eat or drink 6 hours prior to your test. In addition, if you have any metal in your body, have a pacemaker or defibrillator, please be sure to let your ordering physician know. This test typically takes 45 minutes to 1 hour to complete. Should  you need to reschedule, please call [phone] to do so.  You have been scheduled for an abdominal ultrasound at [facility] (1st floor of hospital) on 01-03-22 at 9:00am. Please arrive 15 minutes prior to your appointment for registration. Make certain not to have anything to eat or drink 6 hours prior to your appointment. Should you need to reschedule your appointment, please contact radiology at [phone]. This test typically takes about 30 minutes to perform.   Due to recent changes in healthcare laws, you may see the results of your imaging and laboratory studies on MyChart before your provider has had a chance to review them.  We understand that in some cases there may be results that are confusing or concerning to you. Not all laboratory results come back in the same time frame and the provider may be waiting for multiple results in order to interpret others.  Please give [place] 48 hours in order for your provider to thoroughly review all the results before contacting the office for clarification of your results.   Thank you for entrusting me with your care and choosing [facility].  Dr Tarri Glenn

## 2021-12-23 NOTE — Progress Notes (Addendum)
Referring Provider: Burnis Medin, MD Primary Care Physician:  Burnis Medin, MD   Reason for Consultation: Abnormal liver enzymes   IMPRESSION: Abnormal liver enzymes with elevated transaminases with regular wine use over the years Suspected cirrhosis by ultrasound    - low normal platelets on serial CBCs    - TB, albumin, creatinine, and sodium are normal History of hepatic hemangioma dating back to MRI from 2009 Chronic constipation    - normal TSH and serum calcium levels 07/2021 Chronic reflux Symptomatic internal and external hemorrhoids Personal history of colon polyps    - 6 polyps removed on colonoscopy 07/2019        - 5 tubular adenomas and sessile serrated polyps, one hyperplastic polyp    - Surveillance colonoscopy recommended in 3 years Family history of colon cancer in 2 maternal aunts  Suspected ASH versus NASH. Underlying cirrhosis may be present based on prior imaging.  Clinical complaint today is constipation without alarm features.    PLAN: - ANA, AMA, IgG, IgM - particularly given her history of autoimmune disease - Elastography to confirm fibrosis - MRI to follow-up on prior abnormal liver MRI - Surveillance colonoscopy due 2023 - HAV and HBV vaccination if she has not already received - Start Miralax 17g daily - Reviewed dietary recommendations for constipation - Abstinence from alcohol recommended     HPI: Terri Rodriguez is a 76 y.o. female referred by Dr. Regis Bill for further evaluation of abnormal liver enzymes.  She is a retired Engineer, drilling.  I met her in August 2020 at the time of her surveillance colonoscopy.  This is my first office visit with Terri Rodriguez. The history is obtained through the patient and review of her electronic health record.  She is a former patient of Dr. Nichola Sizer.  GI history is significant for reflux, symptomatic internal hemorrhoids, a family history of colon cancer, a personal history of colon polyps, 2.7cm hemangioma on  liver MRI in 2009, and a history of IBS diagnosed at age 14 at [facility] when she underwent a battery of tests including an endoscopy, barium studies, and a rigid sigmoidoscopy.  As a child she was given castor oil by her mother for constipation.  She also has obstructive sleep apnea, coronary artery disease, obesity, allergic rhinitis, subacute autoimmune thyroiditis.  Found to have abnormal liver enzymes. She was told that she has cirrhosis.  AST elevated from 38-46.  ALT transiently elevated at 40.  Most recently 73.  Total bilirubin, alkaline phosphatase, albumin, and total protein have been normal.  Alpha-1 antitrypsin level normal.  Platelets 194.  INR 1.0.  TSH 1.58.  Hepatitis B surface antigen nonreactive, hep B surface antibody reactive, hep B core total antibody nonreactive, hep C antibody nonreactive [id number]/[id number].  Abdominal ultrasound 02/08/2021 to evaluate abnormal liver enzymes showed cholelithiasis, diffuse hepatocellular disease with possible hepatic cirrhosis, multiple small hepatic cysts.  She stopped alcohol and these results were available. Notes that her nightly wine drinking increased during Covid.   She also reports a history of chronic constipation with hard, small stools. Sense of complete evacuation. Some straining. No blood. Occasional mucous.  She will use an enema PRN.  Serum calcium and TSH normal 08/04/21  Multiple maternal aunts with colon cancer. No family history of liver disease. Mother with RA.    Endoscopic history: - Personal history of colon polyps (tubular adenoma and sessile serrated adenoma in 2007, tubular adenoma 2009) - Prior colonoscopies: 1995, 2001, 2007, 2009, 2011, 2015 -  Colonoscopy 07/09/19 with poor prep °- Colonoscopy 07/10/19: showed 3 small ascending colon polyps, 2 small ascending colon polyps, and and a rectal polyp. Pathology revealed 5 tubular adenomas and sessile serrated polyps. The rectal polyp was a hyperplastic polyp.  ° ° ° °Past  Medical History:  °Diagnosis Date  ° ALLERGIC RHINITIS   ° Anginal pain (HCC)   ° Blood transfusion without reported diagnosis   ° Cataract   ° small right  ° Cholelithiasis 06/09/2008  ° gallstones 2009 on ct scan  ° Complication of anesthesia 2012  ° went to ICU after bilateral knees , unstable vital signs oct 2012, has surgery since did ok  ° Coronary artery disease   ° 30 % narrowing lad  ° Diverticulitis   ° EPICONDYLITIS, LATERAL   ° osteoarthritis, previous joint replacements  ° Fall 10/2018  ° GERD   ° Hepatic hemangioma 06/09/08  ° Hiatal hernia 03/2000  ° Hx of adenomatous polyp of colon 06/05/08  ° Hx of cardiac catheterization 2008   ° clean coronarys DrKelly   ° Hx of chest pain 2003  ° neg cath remot hx of narrowwing lad in 2003 nl 2008, none in many years  ° HYPERLIPIDEMIA   ° IBS (irritable bowel syndrome)   ° LEUKOPENIA, CHRONIC   ° Mononucleosis 1963  ° RAYNAUD'S SYNDROME, HX OF   ° improved after cardiac meds initiated  ° Rosacea   ° facial  ° Sleep apnea   ° SLEEP APNEA, OBSTRUCTIVE   ° cpap, settings "automatic" settings 11-12  ° Subacute thyroiditis   ° UNSPECIFIED ANEMIA   ° ° °Past Surgical History:  °Procedure Laterality Date  ° ABDOMINAL HYSTERECTOMY  1993  ° bso  ° BACK SURGERY  2008  ° l 3 to l4 l4 to l5  ° BARTHOLIN GLAND CYST EXCISION Right 09/09/2019  ° Procedure: EXCISION OF VULVAR MASS TIMES TWO;  Surgeon: Miller, Mary S, MD;  Location: [facility];  Service: Gynecology;  Laterality: Right;  ° CARDIAC CATHETERIZATION  04/07/2007  ° Noncritical coronary artery disease. Contiue medical therapy.  ° CARDIAC CATHETERIZATION  12/03/2007  ° Normal LV function. Mild angiographic mitral valve prolapse. Normal coronary arteries.  ° CARDIOVASCULAR STRESS TEST  11/09/2007  ° Moderate ischemia in Mid Anterior, Mid Anteroseptal, Apical Anterior, and Apical Septal regions. EKG negative for ischemia.  ° CESAREAN SECTION  1985, 1989  ° dental implants  11/2002,11/2011,11/2012  ° dental implants     ° x 6  ° DENTAL SURGERY    ° DILATION AND CURETTAGE OF UTERUS    ° d and e 1988 and 1990  ° JOINT REPLACEMENT  09/21/2011  ° bilateral knee  ° KNEE ARTHROSCOPY    ° bilateral  ° lumbar surgery fixation with disc replacement  10/08  ° Left  ° NASAL SEPTOPLASTY W/ TURBINOPLASTY  05/2000  ° NOCTURNAL POLYSOMNOGRAM  12/31/2006  ° Severe obstructive sleep apnea. AHI-87/hr  ° REPLACEMENT TOTAL KNEE  2012  ° bilateral  ° TONSILLECTOMY  1952  ° adenoids too  ° TOTAL HIP ARTHROPLASTY Left 10/21/2013  ° Procedure: LEFT TOTAL HIP ARTHROPLASTY ANTERIOR APPROACH;  Surgeon: Frank V Aluisio, MD;  Location: [facility];  Service: Orthopedics;  Laterality: Left;  ° TOTAL HIP ARTHROPLASTY Right 07/16/2014  ° Procedure: RIGHT TOTAL HIP ARTHROPLASTY ANTERIOR APPROACH;  Surgeon: Frank Aluisio V, MD;  Location: [facility];  Service: Orthopedics;  Laterality: Right;  ° TRANSTHORACIC ECHOCARDIOGRAM  11/09/2007  ° EF 58%, LV systolic function   normal. Mild aortic root dilation.   WRIST SURGERY Left 04/03/14   ORIF "distal radial head fracture"     Current Outpatient Medications  Medication Sig Dispense Refill   amLODipine (NORVASC) 5 MG tablet TAKE 1 TABLET BY MOUTH EVERY DAY 90 tablet 1   amoxicillin (AMOXIL) 500 MG capsule 2,000 mg. 1 hour prior to dental procedures.     aspirin 81 MG tablet Take 81 mg by mouth daily.     atorvastatin (LIPITOR) 20 MG tablet TAKE 1 TABLET BY MOUTH EVERY DAY AT 6 PM 90 tablet 2   Azelaic Acid 15 % cream Apply 1 application topically 2 (two) times daily. After skin is thoroughly washed and patted dry, gently but thoroughly massage a thin film of azelaic acid cream into the affected area t daily,after shower.     carvedilol (COREG) 3.125 MG tablet Take 1 tablet by mouth 2 times daily with a meal. 180 tablet 1   celecoxib (CELEBREX) 200 MG capsule Take 200 mg by mouth daily.     chlorhexidine (PERIDEX) 0.12 % solution      Cholecalciferol (VITAMIN D3) 2000 UNITS TABS Take 1 tablet by mouth daily.      clindamycin (CLEOCIN T) 1 % external solution 2 (two) times daily as needed.  3   clobetasol (TEMOVATE) 0.05 % external solution Apply topically.     diclofenac sodium (VOLTAREN) 1 % GEL Apply 2-4 g topically 4 (four) times daily. As needed for  Arthritis  Pain. 4 Tube 1   DULoxetine (CYMBALTA) 60 MG capsule Cymbalta 60 mg capsule,delayed release  Take 1 capsule every day by oral route.     estradiol (ESTRACE) 0.5 MG tablet 1/2 tab daily (Patient taking differently: 1/2 tab every other day) 45 tablet 4   Fexofenadine HCl (ALLEGRA ALLERGY PO) Allegra Allergy     fluocinonide (LIDEX) 0.05 % external solution Apply AA on scalp PRN  0   ketoconazole (NIZORAL) 2 % cream SMARTSIG:1 Application Topical 1 to 2 Times Daily     metroNIDAZOLE (METROGEL) 0.75 % gel APPLY TO AFFECTED AREA TWICE A DAY     Multiple Vitamins-Minerals (CENTRUM ADULTS PO) Take 1 tablet by mouth daily.     nitroGLYCERIN (NITROSTAT) 0.4 MG SL tablet PLACE 1 TABLET UNDER THE TONGUE EVERY 5 MINUTES AS NEEDED FOR CHEST PAIN 25 tablet 3   omeprazole (PRILOSEC) 20 MG capsule Take 20 mg by mouth in the morning and at bedtime.     pregabalin (LYRICA) 50 MG capsule Take orally weaning with 183m ; !00 mg 50 mg 100 mg , per day  for one week ,then decrease to 100 -50 -50 mg per day, then 50 mg tid or as directed 42 capsule 1   ramipril (ALTACE) 10 MG capsule TAKE 1 CAPSULE BY MOUTH EVERY DAY 90 capsule 1   valACYclovir (VALTREX) 1000 MG tablet TAKE 2 TABLET BY MOUTH Q12 hours X 2 DOSES WITH FEVER BLISTERS 30 tablet 1   No current facility-administered medications for this visit.    Allergies as of 12/23/2021 - Review Complete 12/23/2021  Allergen Reaction Noted   Codeine  12/02/2013   Crab [shellfish allergy] Itching and Nausea And Vomiting 10/10/2013    Family History  Problem Relation Age of Onset   Rheum arthritis Mother    Diabetes Mother    Heart failure Mother    Thrombocytopenia Mother    Sleep apnea Mother    Ulcers  Mother        PUD  Deep vein thrombosis Father   ° Pulmonary embolism Father   ° Osteoarthritis Father   ° Atrial fibrillation Father   ° Dementia Father   ° Sleep apnea Father   ° Sleep apnea Brother   ° Obesity Brother   ° Sleep apnea Brother   ° Obesity Brother   ° Colon cancer Maternal Aunt 59  ° Colon cancer Maternal Aunt 90  ° Pancreatic cancer Other   ° Uterine cancer Other   ° Leukemia Other   ° Inflammatory bowel disease Other   °     aunt  ° Congenital adrenal hyperplasia Grandchild   ° Atrial fibrillation Brother   ° Esophageal cancer Neg Hx   ° Rectal cancer Neg Hx   ° Stomach cancer Neg Hx   ° ° ° ° °Physical Exam: °Vital signs in last 24 hours: °General:   Alert, in NAD. No scleral icterus. No bilateral temporal wasting.  °Heart:  Regular rate and rhythm; no murmurs °Pulm: Clear anteriorly; no wheezing °Abdomen:  Soft. Nontender. Cental obesity. Nondistended. Normal bowel sounds. No rebound or guarding. No fluid wave.  °LAD: No inguinal or umbilical LAD °Extremities:  Without edema. °Neurologic:  Alert and  oriented x4;  grossly normal neurologically; no asterixis or clonus. °Skin: No jaundice. No palmar erythema. Spider angioma on the chest wall.  °Psych:  Alert and cooperative. Normal mood and affect.  ° °I spent over 40 minutes, including in depth chart review, independent review of results, face-to-face time with the patient, coordinating care, ordering studies and medications as appropriate, and documentation.  ° ° ° L. , MD, MPH °12/23/2021, 2:19 PM ° ° ° °  °

## 2021-12-25 ENCOUNTER — Other Ambulatory Visit: Payer: Self-pay | Admitting: Internal Medicine

## 2021-12-27 ENCOUNTER — Telehealth: Payer: Self-pay | Admitting: Gastroenterology

## 2021-12-27 LAB — IGM: IgM, Serum: 71 mg/dL (ref 50–300)

## 2021-12-27 LAB — MITOCHONDRIAL ANTIBODIES: Mitochondrial M2 Ab, IgG: <=20 U (ref <=20.0)

## 2021-12-27 LAB — IGG: IgG (Immunoglobin G), Serum: 808 mg/dL (ref 600–1540)

## 2021-12-27 LAB — ANA: Anti Nuclear Antibody (ANA): NEGATIVE

## 2021-12-27 NOTE — Telephone Encounter (Signed)
LOV 12/23/21:  PLAN: - ANA, AMA, IgG, IgM - particularly given her history of autoimmune disease - COMPLETED NUT AWAITING RESULTS OF AMA - Elastography to confirm fibrosis - SCHEDULED 01/03/22 - MRI to follow-up on prior abnormal liver MRI - SCHEDULED 01/03/22 - Surveillance colonoscopy due 2023 - NEED CLARIFICATION. LETTER INDICATES REPEAT IN 5 YRS. SHOULD RECALL BE IN 5 YRS (2025) OR 2023? - HAV and HBV vaccination if she has not already received - PT INDECISIVE IF PCP WILL MANAGE OR OUR OFFICE - Start Miralax 17g daily - PT COMPLIANT - Reviewed dietary recommendations for constipation - PT COMPLIANT WITH INCREASED INTAKE OF DIETARY FIBER - Abstinence from alcohol recommended - REMINDER PROVIDED  Returned pt call to inquire further about her concerns. States she has been taking Miralax daily and increasing fiber intake but has been having horrible abd bloating, fatigue, gas and only able to pass small pieces of stool. States she is not able to have a BM. In addition, states she is having increased urgency, frequency and pain when voiding. Further adds she is miserable and wants to know what she can do.   Provided pt with the following recommendations:  Call PCP for evaluation and treatment of dysuria Start bowel purge to remove stool burden (Sent via My Chart) Increase fluid intake to 64 ounces or more daily After completion of bowel purge, continue Miralax daily. May reduce to qod if stools become too loose. Ideally would like 1 large soft stool per day Add daily stool softener Add daily fiber supplement. May titrate gradually if having too much bloating/abd discomfort Discuss management of HAV and HBV vaccines with PCP. Will request clarification of colonoscopy recall Consider moving up date of imaging if no relief to abd discomfort  Verbalized acceptance and understanding. Routing this message to Dr. Tarri Glenn for her to review and to provide further recommendations, if any. Also routing  this message to PCP (Dr. Regis Bill) d/t pt c/o dysuria.

## 2021-12-27 NOTE — Telephone Encounter (Signed)
Patient called states she has not been able to get any relief and is seeking help. States she is really bloated\pain and unhappy.

## 2021-12-27 NOTE — Telephone Encounter (Signed)
Recall updated to reflect. Plan to f/u with pt in the next 24-48 hrs. Any further recommendations re: constipation recommendations provided? Uncertain if low dose Linzess could be considered given h/o constipation. Will await response to bowel purge.

## 2021-12-28 ENCOUNTER — Encounter: Payer: Self-pay | Admitting: Family Medicine

## 2021-12-28 ENCOUNTER — Ambulatory Visit (INDEPENDENT_AMBULATORY_CARE_PROVIDER_SITE_OTHER): Payer: Medicare Other | Admitting: Family Medicine

## 2021-12-28 ENCOUNTER — Other Ambulatory Visit: Payer: Self-pay

## 2021-12-28 VITALS — BP 90/40 | HR 81 | Temp 97.9°F | Wt 256.0 lb

## 2021-12-28 DIAGNOSIS — N390 Urinary tract infection, site not specified: Secondary | ICD-10-CM

## 2021-12-28 DIAGNOSIS — R300 Dysuria: Secondary | ICD-10-CM

## 2021-12-28 LAB — POC URINALSYSI DIPSTICK (AUTOMATED)
Bilirubin, UA: NEGATIVE
Blood, UA: NEGATIVE
Glucose, UA: NEGATIVE
Ketones, UA: NEGATIVE
Leukocytes, UA: NEGATIVE
Nitrite, UA: NEGATIVE
Protein, UA: POSITIVE — AB
Spec Grav, UA: 1.02 (ref 1.010–1.025)
Urobilinogen, UA: 0.2 E.U./dL
pH, UA: 6 (ref 5.0–8.0)

## 2021-12-28 MED ORDER — CIPROFLOXACIN HCL 500 MG PO TABS: 500.0000 mg | ORAL_TABLET | Freq: Two times a day (BID) | ORAL | 0 refills | Status: DC

## 2021-12-28 NOTE — Telephone Encounter (Signed)
My Chart message sent to pt to f/u.

## 2021-12-28 NOTE — Progress Notes (Signed)
° °  Subjective:    Patient ID: Terri Rodriguez, female    DOB: 1946-03-04, 76 y.o.   MRN: [id number]  HPI Here for one week of burning on urination, urgency to urinate, and sharp pains in the right and left lower abdomen. No fever or nausea. She is being followed by Dr. Tarri Glenn for chronic constipation. She is taking Miralax. She is scheduled to have an MRI and an US of the abdomen on 01-03-22.    Review of Systems  Constitutional: Negative.   Respiratory: Negative.    Cardiovascular: Negative.   Gastrointestinal:  Positive for abdominal distention, abdominal pain and constipation. Negative for blood in stool, diarrhea, nausea, rectal pain and vomiting.  Genitourinary:  Positive for dysuria and urgency. Negative for hematuria.      Objective:   Physical Exam Constitutional:      General: She is not in acute distress.    Appearance: Normal appearance.  Cardiovascular:     Rate and Rhythm: Normal rate and regular rhythm.     Pulses: Normal pulses.     Heart sounds: Normal heart sounds.  Pulmonary:     Effort: Pulmonary effort is normal.     Breath sounds: Normal breath sounds.  Abdominal:     General: Abdomen is flat. Bowel sounds are normal. There is no distension.     Palpations: Abdomen is soft. There is no mass.     Tenderness: There is no right CVA tenderness, left CVA tenderness, guarding or rebound.     Hernia: No hernia is present.     Comments: Mildly tender in both lower quadrants   Neurological:     Mental Status: She is alert.          Assessment & Plan:  She seems to have a UTI on top of the chronic constipation. We will treat this with 7 days of Cipro and culture the sample.  Alysia Penna, MD

## 2021-12-28 NOTE — Telephone Encounter (Signed)
Additional My Chart message sent on Dr. Velora Mediate behalf. Orders placed for UA C&S, micro.

## 2021-12-28 NOTE — Telephone Encounter (Signed)
If having dysuria   still  Advise urinalysis with micro and urine culture done at [facility]   Then fu video visit if needed

## 2021-12-29 ENCOUNTER — Encounter: Payer: Self-pay | Admitting: Internal Medicine

## 2021-12-29 ENCOUNTER — Encounter: Payer: Self-pay | Admitting: Cardiovascular Disease

## 2021-12-29 LAB — URINE CULTURE
MICRO NUMBER:: 12911801
SPECIMEN QUALITY:: ADEQUATE

## 2021-12-29 NOTE — Telephone Encounter (Signed)
Routing this My Chart message to Dr. Regis Bill for continuity of care purposes. Will await follow up from pt re: concerns for constipation.

## 2021-12-30 ENCOUNTER — Other Ambulatory Visit: Payer: Self-pay

## 2021-12-30 ENCOUNTER — Ambulatory Visit: Payer: Medicare Other | Admitting: Physician Assistant

## 2021-12-30 ENCOUNTER — Ambulatory Visit (INDEPENDENT_AMBULATORY_CARE_PROVIDER_SITE_OTHER)
Admission: RE | Admit: 2021-12-30 | Discharge: 2021-12-30 | Disposition: A | Discharge status: Home / Self Care | Payer: Medicare Other | Source: Ambulatory Visit | Attending: Physician Assistant | Admitting: Physician Assistant

## 2021-12-30 ENCOUNTER — Encounter: Payer: Self-pay | Admitting: Physician Assistant

## 2021-12-30 VITALS — BP 90/40 | HR 84 | Ht 64.75 in | Wt 258.1 lb

## 2021-12-30 DIAGNOSIS — K5900 Constipation, unspecified: Secondary | ICD-10-CM | POA: Diagnosis not present

## 2021-12-30 DIAGNOSIS — K5909 Other constipation: Secondary | ICD-10-CM

## 2021-12-30 DIAGNOSIS — R1084 Generalized abdominal pain: Secondary | ICD-10-CM

## 2021-12-30 MED ORDER — LINACLOTIDE 290 MCG PO CAPS: 290.0000 ug | ORAL_CAPSULE | Freq: Every day | ORAL | 5 refills | Status: DC

## 2021-12-30 NOTE — Telephone Encounter (Signed)
Patients husband called said the purge did not help the patient at all and she had a really rough not seeking further advise.

## 2021-12-30 NOTE — Progress Notes (Signed)
Chief Complaint: Abdominal pain and constipation  HPI:    Terri Rodriguez is a 76 year old Caucasian female with a past medical history as listed below, known to Dr. Tarri Glenn, who presents to clinic today for abdominal pain and constipation.    12/23/2021 patient seen in clinic by Dr. Tarri Glenn for abnormal liver enzymes.  There is also's but suspected cirrhosis by ultrasound, lower low normal platelets.  History of hepatic meningioma dating back to MRI from 2009, chronic constipation, chronic reflux, symptomatic internal and external hemorrhoids, history of colon polyps removed and a colonoscopy 07/2019 with repeat recommended in 3 years and a family history of colon cancer in 2 maternal aunts.  Suspected she had Ash versus NASH with underlying cirrhosis.  She had further liver labs at that time and elastography to confirm fibrosis as well as a follow-up MRI.  Recommend she start MiraLAX once daily and abstain from alcohol.  All additional liver labs including ANA, AMA, IgG and IgM were normal.    12/27/2021 patient's husband wrote on her behalf to discuss that she had not had a bowel movement in 3 weeks.  Apparently tried MiraLAX 1 dose a day when she got home but this was not working so then she received instructions for bowel purge which did not produce any relief.  She was still passing gas and liquid but only very few small bits of stool.  Discussed that she had been diagnosed with a UTI for which she was on Cipro.  Her husband expressed concern due to her arthritis and replaced joints that it is hard for her to get in and out of bed without his help.    Today, patient is seen accompanied by her husband as an urgent add-on for abdominal pain and constipation.  She explains that when she saw Dr. Tarri Glenn a week ago she had not had a bowel movement for 2 weeks.  She was started on once daily MiraLAX at that time which she took for a few days but nothing happened.  She was then given a MiraLAX bowel purge which she  completed on 12/28/2021.  Patient tells me that nothing happened afterwards and just now this morning she passed a few very small fluffy looking stools and some "mush".  Tells me she has had waves of nausea but currently feels okay.  Associated symptoms include a generalized abdominal discomfort and bloating.   Describes that normally she has a bowel movement once every other day or once every 3 days.    Denies fever, chills, weight loss or blood in her stool.  Past Medical History:  Diagnosis Date   ALLERGIC RHINITIS    Anginal pain ([place])    Blood transfusion without reported diagnosis    Cataract    small right   Cholelithiasis 06/09/2008   gallstones 2009 on ct scan   Complication of anesthesia 2012   went to ICU after bilateral knees , unstable vital signs oct 2012, has surgery since did ok   Coronary artery disease    30 % narrowing lad   Diverticulitis    EPICONDYLITIS, LATERAL    osteoarthritis, previous joint replacements   Fall 10/2018   GERD    Hepatic hemangioma 06/09/08   Hiatal hernia 03/2000   Hx of adenomatous polyp of colon 06/05/08   Hx of cardiac catheterization 2008    clean coronarys DrKelly    Hx of chest pain 2003   neg cath remot hx of narrowwing lad in 2003 nl 2008, none  in many years   HYPERLIPIDEMIA    IBS (irritable bowel syndrome)    LEUKOPENIA, CHRONIC    Mononucleosis 1963   RAYNAUD'S SYNDROME, HX OF    improved after cardiac meds initiated   Rosacea    facial   Sleep apnea    SLEEP APNEA, OBSTRUCTIVE    cpap, settings "automatic" settings 11-12   Subacute thyroiditis    UNSPECIFIED ANEMIA     Past Surgical History:  Procedure Laterality Date   ABDOMINAL HYSTERECTOMY  1993   bso   BACK SURGERY  2008   l 3 to l4 l4 to l5   BARTHOLIN GLAND CYST EXCISION Right 09/09/2019   Procedure: EXCISION OF VULVAR MASS TIMES TWO;  Surgeon: Megan Salon, MD;  Location: [facility];  Service: Gynecology;  Laterality: Right;   CARDIAC  CATHETERIZATION  04/07/2007   Noncritical coronary artery disease. Contiue medical therapy.   CARDIAC CATHETERIZATION  12/03/2007   Normal LV function. Mild angiographic mitral valve prolapse. Normal coronary arteries.   CARDIOVASCULAR STRESS TEST  11/09/2007   Moderate ischemia in Mid Anterior, Mid Anteroseptal, Apical Anterior, and Apical Septal regions. EKG negative for ischemia.   [place]   dental implants  11/2002,11/2011,11/2012   dental implants     x 6   DENTAL SURGERY     DILATION AND CURETTAGE OF UTERUS     d and e 1988 and 1990   JOINT REPLACEMENT  09/21/2011   bilateral knee   KNEE ARTHROSCOPY     bilateral   lumbar surgery fixation with disc replacement  10/08   Left   NASAL SEPTOPLASTY W/ TURBINOPLASTY  05/2000   NOCTURNAL POLYSOMNOGRAM  12/31/2006   Severe obstructive sleep apnea. AHI-87/hr   REPLACEMENT TOTAL KNEE  2012   bilateral   TONSILLECTOMY  1952   adenoids too   TOTAL HIP ARTHROPLASTY Left 10/21/2013   Procedure: LEFT TOTAL HIP ARTHROPLASTY ANTERIOR APPROACH;  Surgeon: [name], MD;  Location: [facility];  Service: Orthopedics;  Laterality: Left;   TOTAL HIP ARTHROPLASTY Right 07/16/2014   Procedure: RIGHT TOTAL HIP ARTHROPLASTY ANTERIOR APPROACH;  Surgeon: [name], MD;  Location: [facility];  Service: Orthopedics;  Laterality: Right;   TRANSTHORACIC ECHOCARDIOGRAM  11/09/2007   EF 78%, LV systolic function normal. Mild aortic root dilation.   WRIST SURGERY Left 04/03/14   ORIF "distal radial head fracture"    Current Outpatient Medications  Medication Sig Dispense Refill   amLODipine (NORVASC) 5 MG tablet TAKE 1 TABLET BY MOUTH EVERY DAY 90 tablet 1   amoxicillin (AMOXIL) 500 MG capsule 2,000 mg. 1 hour prior to dental procedures.     aspirin 81 MG tablet Take 81 mg by mouth daily.     atorvastatin (LIPITOR) 20 MG tablet TAKE 1 TABLET BY MOUTH EVERY DAY AT 6 PM 90 tablet 2   Azelaic Acid 15 % cream Apply 1 application topically 2  (two) times daily. After skin is thoroughly washed and patted dry, gently but thoroughly massage a thin film of azelaic acid cream into the affected area t daily,after shower.     carvedilol (COREG) 3.125 MG tablet Take 1 tablet by mouth 2 times daily with a meal. 180 tablet 1   celecoxib (CELEBREX) 200 MG capsule Take 200 mg by mouth daily.     chlorhexidine (PERIDEX) 0.12 % solution      Cholecalciferol (VITAMIN D3) 2000 UNITS TABS Take 1 tablet by mouth daily.  ciprofloxacin (CIPRO) 500 MG tablet Take 1 tablet (500 mg total) by mouth 2 (two) times daily. 14 tablet 0   clindamycin (CLEOCIN T) 1 % external solution 2 (two) times daily as needed.  3   clobetasol (TEMOVATE) 0.05 % external solution Apply topically.     diclofenac sodium (VOLTAREN) 1 % GEL Apply 2-4 g topically 4 (four) times daily. As needed for  Arthritis  Pain. 4 Tube 1   DULoxetine (CYMBALTA) 60 MG capsule TAKE 1 CAPSULE BY MOUTH EVERY EVENING 90 capsule 0   estradiol (ESTRACE) 0.5 MG tablet 1/2 tab daily (Patient taking differently: 1/2 tab every other day) 45 tablet 4   Fexofenadine HCl (ALLEGRA ALLERGY PO) [facility] Allergy     fluocinonide (LIDEX) 0.05 % external solution Apply AA on scalp PRN  0   ketoconazole (NIZORAL) 2 % cream SMARTSIG:1 Application Topical 1 to 2 Times Daily     metroNIDAZOLE (METROGEL) 0.75 % gel APPLY TO AFFECTED AREA TWICE A DAY     Multiple Vitamins-Minerals (CENTRUM ADULTS PO) Take 1 tablet by mouth daily.     nitroGLYCERIN (NITROSTAT) 0.4 MG SL tablet PLACE 1 TABLET UNDER THE TONGUE EVERY 5 MINUTES AS NEEDED FOR CHEST PAIN 25 tablet 3   omeprazole (PRILOSEC) 20 MG capsule Take 20 mg by mouth in the morning and at bedtime.     pregabalin (LYRICA) 50 MG capsule Take orally weaning with 100mg  ; !00 mg 50 mg 100 mg , per day  for one week ,then decrease to 100 -50 -50 mg per day, then 50 mg tid or as directed 42 capsule 1   ramipril (ALTACE) 10 MG capsule TAKE 1 CAPSULE BY MOUTH EVERY DAY 90 capsule  1   valACYclovir (VALTREX) 1000 MG tablet TAKE 2 TABLET BY MOUTH Q12 hours X 2 DOSES WITH FEVER BLISTERS 30 tablet 1   No current facility-administered medications for this visit.    Allergies as of 12/30/2021 - Review Complete 12/29/2021  Allergen Reaction Noted   Codeine  12/02/2013   Crab [shellfish allergy] Itching and Nausea And Vomiting 10/10/2013    Family History  Problem Relation Age of Onset   Rheum arthritis Mother    Diabetes Mother    Heart failure Mother    Thrombocytopenia Mother    Sleep apnea Mother    Ulcers Mother        PUD   Deep vein thrombosis Father    Pulmonary embolism Father    Osteoarthritis Father    Atrial fibrillation Father    Dementia Father    Sleep apnea Father    Sleep apnea Brother    Obesity Brother    Sleep apnea Brother    Obesity Brother    Colon cancer Maternal Aunt 37   Colon cancer Maternal Aunt 90   Pancreatic cancer Other    Uterine cancer Other    Leukemia Other    Inflammatory bowel disease Other        aunt   Congenital adrenal hyperplasia Grandchild    Atrial fibrillation Brother    Esophageal cancer Neg Hx    Rectal cancer Neg Hx    Stomach cancer Neg Hx     Social History   Socioeconomic History   Marital status: Married    Spouse name: Not on file   Number of children: 1   Years of education: Not on file   Highest education level: Professional school degree (e.g., MD, DDS, DVM, JD)  Occupational History   Occupation:  Physician  Tobacco Use   Smoking status: Never   Smokeless tobacco: Never  Vaping Use   Vaping Use: Never used  Substance and Sexual Activity   Alcohol use: Not Currently    Alcohol/week: 14.0 standard drinks    Types: 14 Glasses of wine per week    Comment: wine 2 glasses per day   Drug use: No   Sexual activity: Yes    Partners: Male    Birth control/protection: Surgical    Comment: TAH/BSO  Other Topics Concern   Not on file  Social History Narrative   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   Social Determinants of Health   Financial Resource Strain: Low Risk    Difficulty of Paying Living Expenses: Not hard at all  Food Insecurity: No Food Insecurity   Worried About Charity fundraiser in the Last Year: Never true   [place] in the Last Year: Never true  Transportation Needs: No Transportation Needs   Lack of Transportation (Medical): No   Lack of Transportation (Non-Medical): No  Physical Activity: Inactive   Days of Exercise per Week: 0 days   Minutes of Exercise per Session: 0 min  Stress: No Stress Concern Present   Feeling of Stress : Only a little  Social Connections: Unknown   Frequency of Communication with Friends and Family: More than three times a week   Frequency of Social Gatherings with Friends and Family: Twice a week   Attends Religious Services: Patient refused   Marine scientist or Organizations: No   Attends Music therapist: Never   Marital Status: Married  Human resources officer Violence: Not At Risk   Fear of Current or Ex-Partner: No   Emotionally Abused: No   Physically Abused: No   Sexually Abused: No    Review of Systems:    Constitutional: No weight loss, fever or chills Cardiovascular: No chest pain Respiratory: No SOB  Gastrointestinal: See HPI and otherwise negative   Physical Exam:  Vital signs: BP (!) 90/40 (BP Location: Left Arm, Patient Position: Sitting, Cuff Size: Large)    Pulse 84    Ht 5' 4.75" (1.645 m) Comment: height measured without shoes   Wt 258 lb 2 oz (117.1 kg)    BMI 43.29 kg/m    Constitutional:   Pleasant overweight Caucasian female appears to be in NAD, Well developed, Well nourished, alert and cooperative Respiratory: Respirations even and unlabored. Lungs clear to auscultation bilaterally.   No wheezes, crackles, or rhonchi.  Cardiovascular: Normal S1, S2. No MRG. Regular rate and rhythm. No  peripheral edema, cyanosis or pallor.  Gastrointestinal:  Soft, moderate distention, mild generalized TTP. No rebound or guarding.  Decreased but present bowel sounds in all 4 quadrants, no appreciable masses or hepatomegaly. Rectal:  Not performed.  Psychiatric: Oriented to person, place and time. Demonstrates good judgement and reason without abnormal affect or behaviors.  See HPI for recent labs and imaging.  Assessment: 1.  Chronic constipation: Chronic constipation with a bowel movement once every 2 to 3 days, acute issue currently with no reasonable bowel movement for the past 3 to 4 weeks, completed a MiraLAX bowel purge 2 days ago with no real results and continues with a generalized abdominal bloating and discomfort 2.  Abdominal pain and bloating: With above  Plan: 1.  Ordered abdominal x-ray two-view  today, though I do not have high suspicion for actual obstruction. 2.  Provided the patient with a bowel prep sample for her to complete entirely this evening. 3.  Recommend the patient back off her diet to soft/clear liquid foods for the next day or 2. 4.  Provided the patient with samples of Linzess 290 mcg to take once daily when she finishes bowel prep above as long as this provides relief for her.  Also sent in a prescription #30 with 3 refills. 5.  Patient was advised that a different physician or my nurse will call her tomorrow in regards to results as I am out of the office. 6.  Patient was told to call us back if she does not have any relief after the bowel prep this evening.  If she does not she may need to go to the ER for disimpaction.  Ellouise Newer, PA-C [facility] 12/30/2021, 2:57 PM  Cc: Burnis Medin, MD

## 2021-12-30 NOTE — Patient Instructions (Signed)
Your provider has requested that you have an abdominal x ray before leaving today. Please go to the basement floor to our Radiology department for the test.  We have sent the following medications to your pharmacy for you to pick up at your convenience: Linzess 290 mcg daily before breakfast.  Complete Plenvu bowel prep.   If you are age 77 or older, your body mass index should be between 23-30. Your Body mass index is 43.29 kg/m. If this is out of the aforementioned range listed, please consider follow up with your Primary Care Provider.  If you are age 56 or younger, your body mass index should be between 19-25. Your Body mass index is 43.29 kg/m. If this is out of the aformentioned range listed, please consider follow up with your Primary Care Provider.   ________________________________________________________  The [facility] providers would like to encourage you to use [facility] to communicate with providers for non-urgent requests or questions.  Due to long hold times on the telephone, sending your provider a message by [facility] may be a faster and more efficient way to get a response.  Please allow 48 business hours for a response.  Please remember that this is for non-urgent requests.  _______________________________________________________

## 2021-12-31 ENCOUNTER — Telehealth: Payer: Self-pay

## 2021-12-31 NOTE — Telephone Encounter (Signed)
-----   Message from Levin Erp, [place] sent at 12/30/2021  3:34 PM EST ----- Regarding: XRAY I am out of the office tomorrow but can you keep an eye out for this patient's abdominal x-ray.  Just want to make sure she does not have an obstruction or anything else abnormal other than likely a large amount of stool.  Please have the provider of the day review so that you can give her a call tomorrow to let her know the results.  Thanks, JL L

## 2021-12-31 NOTE — Telephone Encounter (Addendum)
Dr. Silverio Decamp as DOD this afternoon, please see note below from [facility]. Patient was seen yesterday for chronic constipation and generalized abdominal pain. X-ray results are currently available. Thanks

## 2021-12-31 NOTE — Telephone Encounter (Signed)
Lm on mobile vm for patient to return call °

## 2021-12-31 NOTE — Telephone Encounter (Signed)
Patient has increased stool burden, no evidence of bowel obstruction. Please advise patient to do bowel purge with Miralax.

## 2021-12-31 NOTE — Telephone Encounter (Signed)
X-ray results are still not available. I called Terri Rodriguez, she will call to see if x-ray can be read today. Will await results.

## 2021-12-31 NOTE — Progress Notes (Signed)
Reviewed and agree with management plans. ? ?Terri Rodriguez L. Keryl Gholson, MD, MPH  ?

## 2022-01-02 ENCOUNTER — Encounter (HOSPITAL_COMMUNITY): Payer: Self-pay | Admitting: Emergency Medicine

## 2022-01-02 ENCOUNTER — Emergency Department (HOSPITAL_COMMUNITY): Payer: Medicare Other

## 2022-01-02 ENCOUNTER — Inpatient Hospital Stay (HOSPITAL_COMMUNITY)
Admission: EM | Admit: 2022-01-02 | Discharge: 2022-01-14 | DRG: 853 | Disposition: A | Discharge status: Home Health | Payer: Medicare Other | Attending: Internal Medicine | Admitting: Internal Medicine

## 2022-01-02 ENCOUNTER — Other Ambulatory Visit: Payer: Self-pay

## 2022-01-02 DIAGNOSIS — R109 Unspecified abdominal pain: Secondary | ICD-10-CM | POA: Diagnosis not present

## 2022-01-02 DIAGNOSIS — R6521 Severe sepsis with septic shock: Secondary | ICD-10-CM | POA: Diagnosis not present

## 2022-01-02 DIAGNOSIS — K658 Other peritonitis: Secondary | ICD-10-CM | POA: Diagnosis not present

## 2022-01-02 DIAGNOSIS — N179 Acute kidney failure, unspecified: Secondary | ICD-10-CM | POA: Diagnosis present

## 2022-01-02 DIAGNOSIS — E785 Hyperlipidemia, unspecified: Secondary | ICD-10-CM | POA: Diagnosis present

## 2022-01-02 DIAGNOSIS — Z933 Colostomy status: Secondary | ICD-10-CM

## 2022-01-02 DIAGNOSIS — I2510 Atherosclerotic heart disease of native coronary artery without angina pectoris: Secondary | ICD-10-CM | POA: Diagnosis present

## 2022-01-02 DIAGNOSIS — E860 Dehydration: Secondary | ICD-10-CM | POA: Diagnosis present

## 2022-01-02 DIAGNOSIS — R571 Hypovolemic shock: Secondary | ICD-10-CM | POA: Diagnosis not present

## 2022-01-02 DIAGNOSIS — K5720 Diverticulitis of large intestine with perforation and abscess without bleeding: Secondary | ICD-10-CM | POA: Diagnosis present

## 2022-01-02 DIAGNOSIS — Z8249 Family history of ischemic heart disease and other diseases of the circulatory system: Secondary | ICD-10-CM

## 2022-01-02 DIAGNOSIS — K219 Gastro-esophageal reflux disease without esophagitis: Secondary | ICD-10-CM | POA: Diagnosis present

## 2022-01-02 DIAGNOSIS — G4733 Obstructive sleep apnea (adult) (pediatric): Secondary | ICD-10-CM | POA: Diagnosis present

## 2022-01-02 DIAGNOSIS — D649 Anemia, unspecified: Secondary | ICD-10-CM | POA: Diagnosis not present

## 2022-01-02 DIAGNOSIS — F05 Delirium due to known physiological condition: Secondary | ICD-10-CM | POA: Diagnosis not present

## 2022-01-02 DIAGNOSIS — Z800 Family history of malignant neoplasm of digestive organs: Secondary | ICD-10-CM

## 2022-01-02 DIAGNOSIS — I10 Essential (primary) hypertension: Secondary | ICD-10-CM | POA: Diagnosis not present

## 2022-01-02 DIAGNOSIS — Z6841 Body Mass Index (BMI) 40.0 and over, adult: Secondary | ICD-10-CM | POA: Diagnosis not present

## 2022-01-02 DIAGNOSIS — Z20822 Contact with and (suspected) exposure to covid-19: Secondary | ICD-10-CM | POA: Diagnosis not present

## 2022-01-02 DIAGNOSIS — R55 Syncope and collapse: Secondary | ICD-10-CM | POA: Diagnosis not present

## 2022-01-02 DIAGNOSIS — Z91013 Allergy to seafood: Secondary | ICD-10-CM

## 2022-01-02 DIAGNOSIS — K55039 Acute (reversible) ischemia of large intestine, extent unspecified: Secondary | ICD-10-CM | POA: Diagnosis not present

## 2022-01-02 DIAGNOSIS — K581 Irritable bowel syndrome with constipation: Secondary | ICD-10-CM | POA: Diagnosis not present

## 2022-01-02 DIAGNOSIS — A419 Sepsis, unspecified organism: Principal | ICD-10-CM | POA: Diagnosis present

## 2022-01-02 DIAGNOSIS — R5381 Other malaise: Secondary | ICD-10-CM | POA: Diagnosis present

## 2022-01-02 DIAGNOSIS — Z90710 Acquired absence of both cervix and uterus: Secondary | ICD-10-CM

## 2022-01-02 DIAGNOSIS — E061 Subacute thyroiditis: Secondary | ICD-10-CM | POA: Diagnosis present

## 2022-01-02 DIAGNOSIS — K589 Irritable bowel syndrome without diarrhea: Secondary | ICD-10-CM

## 2022-01-02 DIAGNOSIS — I959 Hypotension, unspecified: Secondary | ICD-10-CM | POA: Diagnosis not present

## 2022-01-02 DIAGNOSIS — R42 Dizziness and giddiness: Secondary | ICD-10-CM | POA: Diagnosis not present

## 2022-01-02 DIAGNOSIS — R030 Elevated blood-pressure reading, without diagnosis of hypertension: Secondary | ICD-10-CM | POA: Diagnosis not present

## 2022-01-02 DIAGNOSIS — Z96643 Presence of artificial hip joint, bilateral: Secondary | ICD-10-CM | POA: Diagnosis present

## 2022-01-02 DIAGNOSIS — I9589 Other hypotension: Secondary | ICD-10-CM | POA: Diagnosis not present

## 2022-01-02 DIAGNOSIS — G9340 Encephalopathy, unspecified: Secondary | ICD-10-CM | POA: Diagnosis not present

## 2022-01-02 DIAGNOSIS — K650 Generalized (acute) peritonitis: Secondary | ICD-10-CM | POA: Diagnosis present

## 2022-01-02 DIAGNOSIS — K5900 Constipation, unspecified: Secondary | ICD-10-CM | POA: Diagnosis not present

## 2022-01-02 DIAGNOSIS — Z8049 Family history of malignant neoplasm of other genital organs: Secondary | ICD-10-CM

## 2022-01-02 DIAGNOSIS — R1010 Upper abdominal pain, unspecified: Secondary | ICD-10-CM | POA: Diagnosis not present

## 2022-01-02 DIAGNOSIS — I700 Atherosclerosis of aorta: Secondary | ICD-10-CM | POA: Diagnosis not present

## 2022-01-02 DIAGNOSIS — K631 Perforation of intestine (nontraumatic): Secondary | ICD-10-CM | POA: Diagnosis present

## 2022-01-02 DIAGNOSIS — Z806 Family history of leukemia: Secondary | ICD-10-CM | POA: Diagnosis not present

## 2022-01-02 DIAGNOSIS — R197 Diarrhea, unspecified: Secondary | ICD-10-CM | POA: Diagnosis not present

## 2022-01-02 DIAGNOSIS — Z9989 Dependence on other enabling machines and devices: Secondary | ICD-10-CM | POA: Diagnosis not present

## 2022-01-02 DIAGNOSIS — Z833 Family history of diabetes mellitus: Secondary | ICD-10-CM | POA: Diagnosis not present

## 2022-01-02 DIAGNOSIS — K5792 Diverticulitis of intestine, part unspecified, without perforation or abscess without bleeding: Secondary | ICD-10-CM

## 2022-01-02 LAB — CBC WITH DIFFERENTIAL/PLATELET
Abs Immature Granulocytes: 0.07 10*3/uL (ref 0.00–0.07)
Basophils Absolute: 0.1 10*3/uL (ref 0.0–0.1)
Basophils Relative: 1 %
Eosinophils Absolute: 0.1 10*3/uL (ref 0.0–0.5)
Eosinophils Relative: 1 %
HCT: 32.3 % — ABNORMAL LOW (ref 36.0–46.0)
Hemoglobin: 10.3 g/dL — ABNORMAL LOW (ref 12.0–15.0)
Immature Granulocytes: 1 %
Lymphocytes Relative: 7 %
Lymphs Abs: 0.8 10*3/uL (ref 0.7–4.0)
MCH: 29.9 pg (ref 26.0–34.0)
MCHC: 31.9 g/dL (ref 30.0–36.0)
MCV: 93.6 fL (ref 80.0–100.0)
Monocytes Absolute: 1.1 10*3/uL — ABNORMAL HIGH (ref 0.1–1.0)
Monocytes Relative: 9 %
Neutro Abs: 10.2 10*3/uL — ABNORMAL HIGH (ref 1.7–7.7)
Neutrophils Relative %: 81 %
Platelets: 262 10*3/uL (ref 150–400)
RBC: 3.45 MIL/uL — ABNORMAL LOW (ref 3.87–5.11)
RDW: 14.2 % (ref 11.5–15.5)
Smear Review: NORMAL
WBC: 12.3 10*3/uL — ABNORMAL HIGH (ref 4.0–10.5)
nRBC: 0 % (ref 0.0–0.2)

## 2022-01-02 LAB — COMPREHENSIVE METABOLIC PANEL
ALT: 26 U/L (ref 0–44)
AST: 34 U/L (ref 15–41)
Albumin: 2.7 g/dL — ABNORMAL LOW (ref 3.5–5.0)
Alkaline Phosphatase: 114 U/L (ref 38–126)
Anion gap: 10 (ref 5–15)
BUN: 51 mg/dL — ABNORMAL HIGH (ref 8–23)
CO2: 21 mmol/L — ABNORMAL LOW (ref 22–32)
Calcium: 7.7 mg/dL — ABNORMAL LOW (ref 8.9–10.3)
Chloride: 101 mmol/L (ref 98–111)
Creatinine, Ser: 2.37 mg/dL — ABNORMAL HIGH (ref 0.44–1.00)
GFR, Estimated: 21 mL/min — ABNORMAL LOW (ref >60)
Glucose, Bld: 131 mg/dL — ABNORMAL HIGH (ref 70–99)
Potassium: 4.4 mmol/L (ref 3.5–5.1)
Sodium: 132 mmol/L — ABNORMAL LOW (ref 135–145)
Total Bilirubin: 0.5 mg/dL (ref 0.3–1.2)
Total Protein: 5.6 g/dL — ABNORMAL LOW (ref 6.5–8.1)

## 2022-01-02 LAB — RESP PANEL BY RT-PCR (FLU A&B, COVID) ARPGX2
Influenza A by PCR: NEGATIVE
Influenza B by PCR: NEGATIVE
SARS Coronavirus 2 by RT PCR: NEGATIVE

## 2022-01-02 LAB — TROPONIN I (HIGH SENSITIVITY)
Troponin I (High Sensitivity): 6 ng/L (ref <18)
Troponin I (High Sensitivity): 4 ng/L (ref <18)

## 2022-01-02 MED ORDER — SODIUM CHLORIDE 0.9 % IV SOLN: INTRAVENOUS | Status: DC

## 2022-01-02 MED ORDER — PANTOPRAZOLE SODIUM 40 MG IV SOLR
40.0000 mg | Freq: Every day | INTRAVENOUS | Status: DC
  Administered 2022-01-02 – 2022-01-07 (×6): 40 mg via INTRAVENOUS
  Filled 2022-01-02 (×6): qty 40

## 2022-01-02 MED ORDER — IPRATROPIUM-ALBUTEROL 0.5-2.5 (3) MG/3ML IN SOLN: 3.0000 mL | Freq: Four times a day (QID) | RESPIRATORY_TRACT | Status: DC | PRN

## 2022-01-02 MED ORDER — NOREPINEPHRINE 4 MG/250ML-% IV SOLN
0.0000 ug/min | INTRAVENOUS | Status: DC
  Administered 2022-01-02: 5 ug/min via INTRAVENOUS
  Filled 2022-01-02: qty 250

## 2022-01-02 MED ORDER — ONDANSETRON HCL 4 MG/2ML IJ SOLN
4.0000 mg | Freq: Four times a day (QID) | INTRAMUSCULAR | Status: DC | PRN
  Administered 2022-01-03 – 2022-01-08 (×3): 4 mg via INTRAVENOUS
  Filled 2022-01-02 (×3): qty 2

## 2022-01-02 MED ORDER — DOCUSATE SODIUM 100 MG PO CAPS: 100.0000 mg | ORAL_CAPSULE | Freq: Two times a day (BID) | ORAL | Status: DC | PRN

## 2022-01-02 MED ORDER — PIPERACILLIN-TAZOBACTAM 3.375 G IVPB 30 MIN
3.3750 g | Freq: Once | INTRAVENOUS | Status: AC
Start: 2022-01-02 — End: 2022-01-02
  Administered 2022-01-02: 3.375 g via INTRAVENOUS
  Filled 2022-01-02: qty 50

## 2022-01-02 MED ORDER — PIPERACILLIN-TAZOBACTAM 3.375 G IVPB
3.3750 g | Freq: Three times a day (TID) | INTRAVENOUS | Status: AC
  Administered 2022-01-03 – 2022-01-07 (×15): 3.375 g via INTRAVENOUS
  Filled 2022-01-02 (×15): qty 50

## 2022-01-02 MED ORDER — SODIUM CHLORIDE 0.9 % IV SOLN: 250.0000 mL | INTRAVENOUS | Status: DC

## 2022-01-02 MED ORDER — SODIUM CHLORIDE 0.9 % IV BOLUS
1000.0000 mL | Freq: Once | INTRAVENOUS | Status: AC
  Administered 2022-01-02: 1000 mL via INTRAVENOUS

## 2022-01-02 MED ORDER — CHLORHEXIDINE GLUCONATE CLOTH 2 % EX PADS
6.0000 | MEDICATED_PAD | Freq: Every day | CUTANEOUS | Status: DC
  Administered 2022-01-02 – 2022-01-07 (×5): 6 via TOPICAL

## 2022-01-02 MED ORDER — ORAL CARE MOUTH RINSE
15.0000 mL | Freq: Two times a day (BID) | OROMUCOSAL | Status: DC
  Administered 2022-01-02 – 2022-01-13 (×19): 15 mL via OROMUCOSAL

## 2022-01-02 MED ORDER — HEPARIN SODIUM (PORCINE) 5000 UNIT/ML IJ SOLN
5000.0000 [IU] | Freq: Three times a day (TID) | INTRAMUSCULAR | Status: DC
  Administered 2022-01-02 – 2022-01-03 (×3): 5000 [IU] via SUBCUTANEOUS
  Filled 2022-01-02 (×3): qty 1

## 2022-01-02 MED ORDER — POLYETHYLENE GLYCOL 3350 17 G PO PACK: 17.0000 g | PACK | Freq: Every day | ORAL | Status: DC | PRN

## 2022-01-02 MED ORDER — NOREPINEPHRINE 4 MG/250ML-% IV SOLN
2.0000 ug/min | INTRAVENOUS | Status: DC
  Administered 2022-01-02: 5 ug/min via INTRAVENOUS

## 2022-01-02 NOTE — H&P (Signed)
NAME:  Terri Rodriguez, MRN:  [id number], DOB:  11-12-1946, LOS: 0 ADMISSION DATE:  01/02/2022, CONSULTATION DATE:  01/02/21 REFERRING MD:  Dr. 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   Constipation x 3 weeks Started miralax daily 1/19.  1/24 trial of "bowel purge" with miralax - only scant BM  Dulcolax.  1/26: BP 90/40 AXR done  Started linzess  Bowel Prep sample given for her to take.  Started on Friday.   Per phone call note from 1/27, GI doc recommended "bowel purge" with miralax.   Today had large BM followed by syncope Hypotensive on arrival to 32I systolic.  1LNS prior to arrival   3L NS given in ED.   Receiving levophed, zosyn,  Cr 2.37 Ca corrected8.7 WBC elevated 12.3 from 3.1  + protein in urine from 1/24   Feels urgency to urinate but  cannot despite attempts.    Pertinent  Medical History  Rosacea NASH vs ASH +/- cirrhosis, abnormal LFTs  Hepatic menangioma (dating badk to 2009) Chronic constipation GERD  Hemorrhoids Allergic rhinits Cataract Cholelithiasis Complication of anesthesia  CAD  Diverticulitis Lateral epicondylitis HLD IBS  Chronic leukopenia Raynauds  Sleep apnea, on cpap Subacute thyroiditis Osteoarthrisis L R THRs,     Cipro bid since 1/24 for UTI Norvasc Lipitor Asa Coreg Ramipril  Cybalta  Estradiol Allegra Linzess daily  SL NTG prn  Lyrica Prilosec  Valtrex prn   Significant Hospital Events: Including procedures, antibiotic start and stop dates in addition to other pertinent events     Interim History / Subjective:    Objective   Blood pressure (!) 88/41, pulse 77, temperature 97.8 F (36.6 C), temperature source Oral, resp. rate 17, SpO2 97 %.         Intake/Output Summary (Last 24 hours) at 01/02/2022 2058 Last data filed at 01/02/2022 1827 Gross per 24 hour  Intake 1000 ml  Output --  Net 1000 ml   There were no vitals filed for this visit.  Examination: General: Sleeping, pleasant, arousable HENT: NCAT, slightly dry  Lungs: CTAB Cardiovascular: RRR  Abdomen: Mild tenderness in LLQ Extremities: no pitting edema Neuro: awake alert, slightly poor recollection of recent events    CT abd/pelv: IMPRESSION: 1. Free air compatible with bowel perforation. Exact site of bowel perforation is not visualized, but is likely from the colon. There are nonspecific colitis findings involving the splenic flexure to the level of the sigmoid colon. No abscess. 2. Cholelithiasis and gallbladder sludge. 3.  Aortic Atherosclerosis (ICD10-I70.0).   Resolved Hospital Problem list     Assessment & Plan:  Colitis/bowel perforation: Very small amount of free air.  Defer to surgery regarding ongoing management.  Antiobiotics - started on zosyn.   Continue fluid resuscitation.  Levophed as needed.   Hold all PO including meds.    UTI recently diagnosed and started on cipro 1/24? Hold and recheck UA.   Urine retention: feels urgency but not able to urinate.  Bladder scan and straight cath if needed.   OSA: on CPAP at home. Continue here    Best Practice (right click and "Reselect all SmartList Selections" daily)   Diet/type: NPO DVT prophylaxis: prophylactic heparin  GI prophylaxis: PPI Lines: N/A Foley:  N/A Code Status:  full code Last date of multidisciplinary goals of care discussion []   Labs   CBC: Recent Labs  Lab 01/02/22 1634  WBC 12.3*  NEUTROABS 10.2*  HGB 10.3*  HCT 32.3*  MCV 93.6  PLT 093    Basic Metabolic Panel: Recent Labs  Lab 01/02/22 1634  NA 132*  K 4.4  CL 101  CO2 21*  GLUCOSE 131*  BUN 51*  CREATININE 2.37*  CALCIUM 7.7*   GFR: Estimated Creatinine Clearance: 25.7 mL/min (A) (by C-G  formula based on SCr of 2.37 mg/dL (H)). Recent Labs  Lab 01/02/22 1634  WBC 12.3*    Liver Function Tests: Recent Labs  Lab 01/02/22 1634  AST 34  ALT 26  ALKPHOS 114  BILITOT 0.5  PROT 5.6*  ALBUMIN 2.7*   No results for input(s): LIPASE, AMYLASE in the last 168 hours. No results for input(s): AMMONIA in the last 168 hours.  ABG No results found for: PHART, PCO2ART, PO2ART, HCO3, TCO2, ACIDBASEDEF, O2SAT   Coagulation Profile: No results for input(s): INR, PROTIME in the last 168 hours.  Cardiac Enzymes: No results for input(s): CKTOTAL, CKMB, CKMBINDEX, TROPONINI in the last 168 hours.  HbA1C: Hgb A1c MFr Bld  Date/Time Value Ref Range Status  08/04/2021 09:05 AM 5.7 4.6 - 6.5 % Final    Comment:    Glycemic Control Guidelines for People with Diabetes:Non Diabetic:  <6%Goal of Therapy: <7%Additional Action Suggested:  >8%   10/15/2020 09:22 AM 5.2 <5.7 % of total Hgb Final    Comment:    For the purpose of screening for the presence of diabetes: . <5.7%       Consistent with the absence of diabetes 5.7-6.4%    Consistent with increased risk for diabetes             (prediabetes) > or =6.5%  Consistent with diabetes . This assay result is consistent with a decreased risk of diabetes. . Currently, no consensus exists regarding use of hemoglobin A1c for diagnosis of diabetes in children. . According to American Diabetes Association (ADA) guidelines, hemoglobin A1c <7.0% represents optimal control in non-pregnant diabetic patients. Different metrics may apply to specific patient populations.  Standards of Medical Care in Diabetes(ADA). .     CBG: No results for input(s): GLUCAP in the last 168 hours.  Review of Systems:     Past Medical History:  She,  has a past medical history of ALLERGIC RHINITIS, Anginal pain ([place]), Blood transfusion without reported diagnosis, Cataract, Cholelithiasis (81/82/9937), Complication of anesthesia (2012), Coronary  artery disease, Diverticulitis, EPICONDYLITIS, LATERAL, Fall (10/2018), GERD, Hepatic hemangioma (06/09/08), Hiatal hernia (03/2000), adenomatous polyp of colon (06/05/08), cardiac catheterization (2008 ), chest pain (2003), HYPERLIPIDEMIA, IBS (irritable bowel syndrome), LEUKOPENIA, CHRONIC, Mononucleosis (1963), RAYNAUD'S SYNDROME, HX OF, Rosacea, Sleep apnea, SLEEP APNEA, OBSTRUCTIVE, Subacute thyroiditis, and UNSPECIFIED ANEMIA.   Surgical History:   Past Surgical History:  Procedure Laterality Date   ABDOMINAL HYSTERECTOMY  1993   bso   BACK SURGERY  2008   l 3 to l4 l4 to l5   BARTHOLIN GLAND CYST EXCISION Right 09/09/2019   Procedure: EXCISION OF VULVAR MASS TIMES TWO;  Surgeon: Megan Salon, MD;  Location: [facility];  Service: Gynecology;  Laterality: Right;   CARDIAC CATHETERIZATION  04/07/2007   Noncritical coronary artery disease. Contiue medical therapy.   CARDIAC CATHETERIZATION  12/03/2007   Normal LV function. Mild angiographic mitral valve prolapse. Normal coronary arteries.   CARDIOVASCULAR STRESS TEST  11/09/2007  Moderate ischemia in Mid Anterior, Mid Anteroseptal, Apical Anterior, and Apical Septal regions. EKG negative for ischemia.   [place]   dental implants  11/2002,11/2011,11/2012   dental implants     x 6   DENTAL SURGERY     DILATION AND CURETTAGE OF UTERUS     d and e 1988 and 1990   JOINT REPLACEMENT  09/21/2011   bilateral knee   KNEE ARTHROSCOPY     bilateral   lumbar surgery fixation with disc replacement  10/08   Left   NASAL SEPTOPLASTY W/ TURBINOPLASTY  05/2000   NOCTURNAL POLYSOMNOGRAM  12/31/2006   Severe obstructive sleep apnea. AHI-87/hr   REPLACEMENT TOTAL KNEE  2012   bilateral   TONSILLECTOMY  1952   adenoids too   TOTAL HIP ARTHROPLASTY Left 10/21/2013   Procedure: LEFT TOTAL HIP ARTHROPLASTY ANTERIOR APPROACH;  Surgeon: [name], MD;  Location: [facility];  Service: Orthopedics;  Laterality: Left;    TOTAL HIP ARTHROPLASTY Right 07/16/2014   Procedure: RIGHT TOTAL HIP ARTHROPLASTY ANTERIOR APPROACH;  Surgeon: [name], MD;  Location: [facility];  Service: Orthopedics;  Laterality: Right;   TRANSTHORACIC ECHOCARDIOGRAM  11/09/2007   EF 09%, LV systolic function normal. Mild aortic root dilation.   WRIST SURGERY Left 04/03/14   ORIF "distal radial head fracture"     Social History:   reports that she has never smoked. She has never used smokeless tobacco. She reports that she does not currently use alcohol after a past usage of about 14.0 standard drinks per week. She reports that she does not use drugs.   Family History:  Her family history includes Atrial fibrillation in her brother and father; Colon cancer (age of onset: 81) in her maternal aunt; Colon cancer (age of onset: 20) in her maternal aunt; Congenital adrenal hyperplasia in her grandchild; Deep vein thrombosis in her father; Dementia in her father; Diabetes in her mother; Heart failure in her mother; Inflammatory bowel disease in an other family member; Leukemia in an other family member; Obesity in her brother and brother; Osteoarthritis in her father; Pancreatic cancer in an other family member; Pulmonary embolism in her father; Rheum arthritis in her mother; Sleep apnea in her brother, brother, father, and mother; Thrombocytopenia in her mother; Ulcers in her mother; Uterine cancer in an other family member. There is no history of Esophageal cancer, Rectal cancer, or Stomach cancer.   Allergies Allergies  Allergen Reactions   Codeine     nausea   Crab [Shellfish Allergy] Itching and Nausea And Vomiting     Home Medications  Prior to Admission medications   Medication Sig Start Date End Date Taking? Authorizing Provider  amLODipine (NORVASC) 5 MG tablet TAKE 1 TABLET BY MOUTH EVERY DAY Patient taking differently: Take 5 mg by mouth daily. 04/07/21  Yes Troy Sine, MD  amoxicillin (AMOXIL) 500 MG capsule Take 2,000 mg by  mouth See admin instructions. 1 hour prior to dental procedures.   Yes [provider]  aspirin 81 MG tablet Take 81 mg by mouth daily.   Yes [provider]  atorvastatin (LIPITOR) 20 MG tablet TAKE 1 TABLET BY MOUTH EVERY DAY AT 6 PM Patient taking differently: Take 20 mg by mouth daily. 10/19/21  Yes Troy Sine, MD  Azelaic Acid 15 % cream Apply 1 application topically 2 (two) times daily as needed (rosacea). After skin is thoroughly washed and patted dry, gently but thoroughly massage a thin  film of azelaic acid cream into the affected area t daily,after shower.   Yes [provider]  carvedilol (COREG) 3.125 MG tablet Take 1 tablet by mouth 2 times daily with a meal. Patient taking differently: Take 3.125 mg by mouth 2 (two) times daily with a meal. 10/19/21  Yes Troy Sine, MD  celecoxib (CELEBREX) 200 MG capsule Take 200 mg by mouth daily.   Yes [provider]  ciprofloxacin (CIPRO) 500 MG tablet Take 1 tablet (500 mg total) by mouth 2 (two) times daily. Patient taking differently: Take 500 mg by mouth 2 (two) times daily. Start date : 12/28/21 12/28/21  Yes Laurey Morale, MD  clindamycin (CLEOCIN T) 1 % external solution 1 application 2 (two) times daily as needed (for scalp). 03/29/15  Yes [provider]  clobetasol (TEMOVATE) 0.05 % external solution Apply 1 application topically daily as needed (itching). 01/06/21  Yes [provider]  diclofenac sodium (VOLTAREN) 1 % GEL Apply 2-4 g topically 4 (four) times daily. As needed for  Arthritis  Pain. Patient taking differently: Apply 2-4 g topically 4 (four) times daily as needed (pain). 11/08/17  Yes Panosh, Standley Brooking, MD  DULoxetine (CYMBALTA) 60 MG capsule TAKE 1 CAPSULE BY MOUTH EVERY EVENING Patient taking differently: 60 mg daily. 12/29/21  Yes Panosh, Standley Brooking, MD  estradiol (ESTRACE) 0.5 MG tablet 1/2 tab daily Patient taking differently: Take 0.25 mg by mouth every other day.  08/07/20  Yes Megan Salon, MD  fexofenadine (ALLEGRA) 180 MG tablet Take 180 mg by mouth daily.   Yes [provider]  fluocinonide (LIDEX) 0.05 % external solution 1 application daily as needed (scalp). 10/20/17  Yes [provider]  ketoconazole (NIZORAL) 2 % cream 1 application daily as needed for irritation. 12/12/19  Yes [provider]  linaclotide Rolan Lipa) 290 MCG CAPS capsule Take 1 capsule (290 mcg total) by mouth daily before breakfast. 12/30/21  Yes Lemmon, Lavone Nian, PA  metroNIDAZOLE (METROGEL) 1.61 % gel 1 application daily as needed. 05/25/19  Yes [provider]  nitroGLYCERIN (NITROSTAT) 0.4 MG SL tablet PLACE 1 TABLET UNDER THE TONGUE EVERY 5 MINUTES AS NEEDED FOR CHEST PAIN Patient taking differently: 0.4 mg every 5 (five) minutes as needed for chest pain. 12/23/20  Yes Troy Sine, MD  omeprazole (PRILOSEC) 20 MG capsule Take 40 mg by mouth daily.   Yes [provider]  pregabalin (LYRICA) 50 MG capsule Take orally weaning with 100mg  ; !00 mg 50 mg 100 mg , per day  for one week ,then decrease to 100 -50 -50 mg per day, then 50 mg tid or as directed Patient taking differently: Take 50 mg by mouth 3 (three) times daily. At 7 am, 3 pm and 11 pm 12/02/21  Yes Panosh, Standley Brooking, MD  psyllium (REGULOID) 0.52 g capsule Take 0.52 g by mouth 2 (two) times daily.   Yes [provider]  ramipril (ALTACE) 10 MG capsule TAKE 1 CAPSULE BY MOUTH EVERY DAY Patient taking differently: Take 10 mg by mouth daily. 10/19/21  Yes Troy Sine, MD  valACYclovir (VALTREX) 1000 MG tablet TAKE 2 TABLET BY MOUTH Q12 hours X 2 DOSES WITH FEVER BLISTERS Patient taking differently: Take 1,000 mg by mouth daily as needed (breakouts). 12/25/20  Yes Panosh, Standley Brooking, MD     Critical care time: 45 min

## 2022-01-02 NOTE — Consult Note (Addendum)
CC: diarrhea and syncopal episode Referring:  Dr Shirlyn Goltz  HPI: Terri Rodriguez is an 76 y.o. 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  She was brought by EMS to the emergency department today for a syncopal episode.  She has been having several weeks of constipation.  She started MiraLAX under orders of her gastroenterologist.  She had done a suppository and enemas as well without results.  She was then told to take a large dose of her lax as a purge on Friday.  She had a very large bowel movement earlier today.  Apparently she was in her kitchen when she became dizzy and had a syncopal episode.  Her blood pressure at that time was in the 60s.  Per her husband's report she has been hypotensive for more than a week but has not stopped her antihypertensive medications.  Surgery was asked to see the patient after a CAT scan of the abdomen pelvis showed possible colitis of the splenic flexure and descending colon and some free air of uncertain etiology.  There is no free fluid. Currently she has remained hypotensive with a heart rate only in the 80s.  She denies chest pain.  She has a history similar to Raynaud's disease and has had some vague cardiac history with some chest pain secondary to vasospasms but is followed closely by cardiology who just saw her in January.  Her last colonoscopy in 2020 was significant only for small polyps.  She has no previous history of diverticulitis. Currently, she reports minimal left-sided abdominal pain.  She has had some elevated liver function test in the past and it was suspected she may have some cirrhosis but her liver function tests are normal today.  Past Medical History:  Diagnosis Date   ALLERGIC RHINITIS    Anginal pain ([place])    Blood transfusion without reported diagnosis    Cataract    small right   Cholelithiasis 06/09/2008   gallstones 2009 on ct scan   Complication of anesthesia 2012   went to ICU after bilateral knees , unstable vital signs oct 2012,  has surgery since did ok   Coronary artery disease    30 % narrowing lad   Diverticulitis    EPICONDYLITIS, LATERAL    osteoarthritis, previous joint replacements   Fall 10/2018   GERD    Hepatic hemangioma 06/09/08   Hiatal hernia 03/2000   Hx of adenomatous polyp of colon 06/05/08   Hx of cardiac catheterization 2008    clean coronarys DrKelly    Hx of chest pain 2003   neg cath remot hx of narrowwing lad in 2003 nl 2008, none in many years   HYPERLIPIDEMIA    IBS (irritable bowel syndrome)    LEUKOPENIA, CHRONIC    Mononucleosis 1963   RAYNAUD'S SYNDROME, HX OF    improved after cardiac meds initiated   Rosacea    facial   Sleep apnea    SLEEP APNEA, OBSTRUCTIVE    cpap, settings "automatic" settings 11-12   Subacute thyroiditis    UNSPECIFIED ANEMIA     Past Surgical History:  Procedure Laterality Date   ABDOMINAL HYSTERECTOMY  1993   bso   BACK SURGERY  2008   l 3 to l4 l4 to l5   BARTHOLIN GLAND CYST EXCISION Right 09/09/2019   Procedure: EXCISION OF VULVAR MASS TIMES TWO;  Surgeon: Megan Salon, MD;  Location: [facility];  Service: Gynecology;  Laterality: Right;   CARDIAC CATHETERIZATION  04/07/2007   Noncritical coronary artery disease. Contiue medical therapy.   CARDIAC CATHETERIZATION  12/03/2007   Normal LV function. Mild angiographic mitral valve prolapse. Normal coronary arteries.   CARDIOVASCULAR STRESS TEST  11/09/2007   Moderate ischemia in Mid Anterior, Mid Anteroseptal, Apical Anterior, and Apical Septal regions. EKG negative for ischemia.   [place]   dental implants  11/2002,11/2011,11/2012   dental implants     x 6   DENTAL SURGERY     DILATION AND CURETTAGE OF UTERUS     d and e 1988 and 1990   JOINT REPLACEMENT  09/21/2011   bilateral knee   KNEE ARTHROSCOPY     bilateral   lumbar surgery fixation with disc replacement  10/08   Left   NASAL SEPTOPLASTY W/ TURBINOPLASTY  05/2000   NOCTURNAL POLYSOMNOGRAM   12/31/2006   Severe obstructive sleep apnea. AHI-87/hr   REPLACEMENT TOTAL KNEE  2012   bilateral   TONSILLECTOMY  1952   adenoids too   TOTAL HIP ARTHROPLASTY Left 10/21/2013   Procedure: LEFT TOTAL HIP ARTHROPLASTY ANTERIOR APPROACH;  Surgeon: [name], MD;  Location: [facility];  Service: Orthopedics;  Laterality: Left;   TOTAL HIP ARTHROPLASTY Right 07/16/2014   Procedure: RIGHT TOTAL HIP ARTHROPLASTY ANTERIOR APPROACH;  Surgeon: [name], MD;  Location: [facility];  Service: Orthopedics;  Laterality: Right;   TRANSTHORACIC ECHOCARDIOGRAM  11/09/2007   EF 40%, LV systolic function normal. Mild aortic root dilation.   WRIST SURGERY Left 04/03/14   ORIF "distal radial head fracture"    Family History  Problem Relation Age of Onset   Rheum arthritis Mother    Diabetes Mother    Heart failure Mother    Thrombocytopenia Mother    Sleep apnea Mother    Ulcers Mother        PUD   Deep vein thrombosis Father    Pulmonary embolism Father    Osteoarthritis Father    Atrial fibrillation Father    Dementia Father    Sleep apnea Father    Sleep apnea Brother    Obesity Brother    Sleep apnea Brother    Obesity Brother    Colon cancer Maternal Aunt 43   Colon cancer Maternal Aunt 90   Pancreatic cancer Other    Uterine cancer Other    Leukemia Other    Inflammatory bowel disease Other        aunt   Congenital adrenal hyperplasia Grandchild    Atrial fibrillation Brother    Esophageal cancer Neg Hx    Rectal cancer Neg Hx    Stomach cancer Neg Hx     Social:  reports that she has never smoked. She has never used smokeless tobacco. She reports that she does not currently use alcohol after a past usage of about 14.0 standard drinks per week. She reports that she does not use drugs.  Allergies:  Allergies  Allergen Reactions   Codeine     nausea   Crab [Shellfish Allergy] Itching and Nausea And Vomiting    Medications: I have reviewed the patient's current  medications.  Results for orders placed or performed during the hospital encounter of 01/02/22 (from the past 48 hour(s))  CBC with Differential/Platelet     Status: Abnormal   Collection Time: 01/02/22  4:34 PM  Result Value Ref Range   WBC 12.3 (H) 4.0 - 10.5 K/uL   RBC 3.45 (L) 3.87 - 5.11 MIL/uL   Hemoglobin  10.3 (L) 12.0 - 15.0 g/dL   HCT 32.3 (L) 36.0 - 46.0 %   MCV 93.6 80.0 - 100.0 fL   MCH 29.9 26.0 - 34.0 pg   MCHC 31.9 30.0 - 36.0 g/dL   RDW 14.2 11.5 - 15.5 %   Platelets 262 150 - 400 K/uL   nRBC 0.0 0.0 - 0.2 %   Neutrophils Relative % 81 %   Neutro Abs 10.2 (H) 1.7 - 7.7 K/uL   Lymphocytes Relative 7 %   Lymphs Abs 0.8 0.7 - 4.0 K/uL   Monocytes Relative 9 %   Monocytes Absolute 1.1 (H) 0.1 - 1.0 K/uL   Eosinophils Relative 1 %   Eosinophils Absolute 0.1 0.0 - 0.5 K/uL   Basophils Relative 1 %   Basophils Absolute 0.1 0.0 - 0.1 K/uL   WBC Morphology MILD LEFT SHIFT (1-5% METAS, OCC MYELO, OCC BANDS)    Smear Review Normal platelet morphology    Immature Granulocytes 1 %   Abs Immature Granulocytes 0.07 0.00 - 0.07 K/uL    Comment: Performed at [facility], [place] [address] [place], [place] [address]  Comprehensive metabolic panel     Status: Abnormal   Collection Time: 01/02/22  4:34 PM  Result Value Ref Range   Sodium 132 (L) 135 - 145 mmol/L   Potassium 4.4 3.5 - 5.1 mmol/L   Chloride 101 98 - 111 mmol/L   CO2 21 (L) 22 - 32 mmol/L   Glucose, Bld 131 (H) 70 - 99 mg/dL    Comment: Glucose reference range applies only to samples taken after fasting for at least 8 hours.   BUN 51 (H) 8 - 23 mg/dL   Creatinine, Ser 2.37 (H) 0.44 - 1.00 mg/dL   Calcium 7.7 (L) 8.9 - 10.3 mg/dL   Total Protein 5.6 (L) 6.5 - 8.1 g/dL   Albumin 2.7 (L) 3.5 - 5.0 g/dL   AST 34 15 - 41 U/L   ALT 26 0 - 44 U/L   Alkaline Phosphatase 114 38 - 126 U/L   Total Bilirubin 0.5 0.3 - 1.2 mg/dL   GFR, Estimated 21 (L) >60 mL/min    Comment: (NOTE) Calculated using  the CKD-EPI Creatinine Equation (2021)    Anion gap 10 5 - 15    Comment: Performed at [facility], [place] [address] [place], [place] [address]  Troponin I (High Sensitivity)     Status: None   Collection Time: 01/02/22  4:34 PM  Result Value Ref Range   Troponin I (High Sensitivity) 6 <18 ng/L    Comment: (NOTE) Elevated high sensitivity troponin I (hsTnI) values and significant  changes across serial measurements may suggest ACS but many other  chronic and acute conditions are known to elevate hsTnI results.  Refer to the "Links" section for chest pain algorithms and additional  guidance. Performed at [facility], [place] [address] [place], [place] [address]   Troponin I (High Sensitivity)     Status: None   Collection Time: 01/02/22  6:34 PM  Result Value Ref Range   Troponin I (High Sensitivity) 4 <18 ng/L    Comment: (NOTE) Elevated high sensitivity troponin I (hsTnI) values and significant  changes across serial measurements may suggest ACS but many other  chronic and acute conditions are known to elevate hsTnI results.  Refer to the "Links" section for chest pain algorithms and additional  guidance. Performed at [facility], [place] [address]  [place],  [address]     CT ABDOMEN PELVIS WO CONTRAST  Result Date: 01/02/2022 CLINICAL DATA:  Diarrhea and abdominal pain. EXAM: CT ABDOMEN AND PELVIS WITHOUT CONTRAST TECHNIQUE: Multidetector CT imaging of the abdomen and pelvis was performed following the standard protocol without IV contrast. RADIATION DOSE REDUCTION: This exam was performed according to the departmental dose-optimization program which includes automated exposure control, adjustment of the mA and/or kV according to patient size and/or use of iterative reconstruction technique. COMPARISON:  CT abdomen and pelvis 01/08/2008. MRI abdomen 10/21/2009. FINDINGS: Lower chest: There is atelectasis in the lung  bases. Hepatobiliary: Hypodense lesions in the left lobe of the liver are again seen and have increased in size with the largest now measuring 2.3 cm, characterized as a cyst. Hypodense lesion in the dome of the liver measures 3.6 x 1.4 cm measuring similar to prior MRI in 2010. This is previously characterized as atypical hemangioma. Gallstones and sludge are present. No biliary ductal dilatation. Pancreas: Unremarkable. No pancreatic ductal dilatation or surrounding inflammatory changes. Spleen: Normal in size without focal abnormality. Adrenals/Urinary Tract: Adrenal glands are unremarkable. Kidneys are normal, without renal calculi, focal lesion, or hydronephrosis. Bladder is unremarkable. Stomach/Bowel: There is wall thickening and inflammation of the splenic flexure, descending colon and proximal sigmoid colon compatible with colitis. There is a small amount of free air throughout the abdomen and pelvis compatible with bowel perforation. Exact site of perforation is not visualized. There is no bowel obstruction. The appendix is not seen. Small bowel and stomach appear within normal limits. No pneumatosis. No portal venous gas identified. Vascular/Lymphatic: Aortic atherosclerosis. No enlarged abdominal or pelvic lymph nodes. Reproductive: Status post hysterectomy. No adnexal masses. Other: There is a small amount of free air in the abdomen and pelvis. There is wide-mouth umbilical hernia. No abscess identified. Musculoskeletal: There is some subcutaneous scarring in the anterior thighs bilaterally. L3, L4, L5 posterior fusion hardware appears uncomplicated. Bilateral hip arthroplasties are present. No acute fractures are seen. IMPRESSION: 1. Free air compatible with bowel perforation. Exact site of bowel perforation is not visualized, but is likely from the colon. There are nonspecific colitis findings involving the splenic flexure to the level of the sigmoid colon. No abscess. 2. Cholelithiasis and  gallbladder sludge. 3.  Aortic Atherosclerosis (ICD10-I70.0). These results were called by telephone at the time of interpretation on 01/02/2022 at 7:21 pm to provider DAVID YAO , who verbally acknowledged these results. Electronically Signed   By: Ronney Asters M.D.   On: 01/02/2022 19:22    ROS - all of the below systems have been reviewed with the patient and positives are indicated with bold text General: no chills, fever or night sweats Eyes: no blurry vision or double vision ENT:  no epistaxis or sore throat Allergy/Immunology: itchy/watery eyes or nasal congestion Hematologic/Lymphatic: bleeding problems, blood clots or swollen lymph nodes Endocrine: temperature intolerance or unexpected weight changes Breast: new or changing breast lumps or nipple discharge Resp: cough, shortness of breath, or wheezing CV: chest pain or dyspnea on exertion GI: as per HPI GU: dysuria, trouble voiding, or hematuria MSK: joint pain or joint stiffness Neuro: TIA or stroke symptoms Derm: pruritus and skin lesion changes Psych: anxiety and depression  PE Blood pressure (!) 88/41, pulse 77, temperature 97.8 F (36.6 C), temperature source Oral, resp. rate 17, SpO2 97 %. Constitutional: ill appearing; conversant; no deformities Eyes:dryconjunctiva; no lid lag; anicteric; PERRL Neck: Trachea midline; no thyromegaly Lungs: Normal respiratory effort; no tactile fremitus CV: RRR;  no palpable thrills; no pitting edema GI: Abd morbidly obese, surprisingly soft with mild to moderate tenderness on the left abdomen.  The rest the abdomen is soft and nontender; no palpable hepatosplenomegaly.  she has a well-healed lower midline incision MSK: Normal range of motion of extremities; no clubbing/cyanosis Psychiatric: Appropriate affect; a little somnolent, oriented x3   Results for orders placed or performed during the hospital encounter of 01/02/22 (from the past 48 hour(s))  CBC with Differential/Platelet      Status: Abnormal   Collection Time: 01/02/22  4:34 PM  Result Value Ref Range   WBC 12.3 (H) 4.0 - 10.5 K/uL   RBC 3.45 (L) 3.87 - 5.11 MIL/uL   Hemoglobin 10.3 (L) 12.0 - 15.0 g/dL   HCT 32.3 (L) 36.0 - 46.0 %   MCV 93.6 80.0 - 100.0 fL   MCH 29.9 26.0 - 34.0 pg   MCHC 31.9 30.0 - 36.0 g/dL   RDW 14.2 11.5 - 15.5 %   Platelets 262 150 - 400 K/uL   nRBC 0.0 0.0 - 0.2 %   Neutrophils Relative % 81 %   Neutro Abs 10.2 (H) 1.7 - 7.7 K/uL   Lymphocytes Relative 7 %   Lymphs Abs 0.8 0.7 - 4.0 K/uL   Monocytes Relative 9 %   Monocytes Absolute 1.1 (H) 0.1 - 1.0 K/uL   Eosinophils Relative 1 %   Eosinophils Absolute 0.1 0.0 - 0.5 K/uL   Basophils Relative 1 %   Basophils Absolute 0.1 0.0 - 0.1 K/uL   WBC Morphology MILD LEFT SHIFT (1-5% METAS, OCC MYELO, OCC BANDS)    Smear Review Normal platelet morphology    Immature Granulocytes 1 %   Abs Immature Granulocytes 0.07 0.00 - 0.07 K/uL    Comment: Performed at [facility], [place] [address] [place], [place] [address]  Comprehensive metabolic panel     Status: Abnormal   Collection Time: 01/02/22  4:34 PM  Result Value Ref Range   Sodium 132 (L) 135 - 145 mmol/L   Potassium 4.4 3.5 - 5.1 mmol/L   Chloride 101 98 - 111 mmol/L   CO2 21 (L) 22 - 32 mmol/L   Glucose, Bld 131 (H) 70 - 99 mg/dL    Comment: Glucose reference range applies only to samples taken after fasting for at least 8 hours.   BUN 51 (H) 8 - 23 mg/dL   Creatinine, Ser 2.37 (H) 0.44 - 1.00 mg/dL   Calcium 7.7 (L) 8.9 - 10.3 mg/dL   Total Protein 5.6 (L) 6.5 - 8.1 g/dL   Albumin 2.7 (L) 3.5 - 5.0 g/dL   AST 34 15 - 41 U/L   ALT 26 0 - 44 U/L   Alkaline Phosphatase 114 38 - 126 U/L   Total Bilirubin 0.5 0.3 - 1.2 mg/dL   GFR, Estimated 21 (L) >60 mL/min    Comment: (NOTE) Calculated using the CKD-EPI Creatinine Equation (2021)    Anion gap 10 5 - 15    Comment: Performed at [facility], [place] [address], [place], [place]  [address]  Troponin I (High Sensitivity)     Status: None   Collection Time: 01/02/22  4:34 PM  Result Value Ref Range   Troponin I (High Sensitivity) 6 <18 ng/L    Comment: (NOTE) Elevated high sensitivity troponin I (hsTnI) values and significant  changes across serial measurements may suggest ACS but many other  chronic and acute conditions are known to elevate hsTnI  results.  Refer to the "Links" section for chest pain algorithms and additional  guidance. Performed at [facility], [place] [address] [place], [place] [address]   Troponin I (High Sensitivity)     Status: None   Collection Time: 01/02/22  6:34 PM  Result Value Ref Range   Troponin I (High Sensitivity) 4 <18 ng/L    Comment: (NOTE) Elevated high sensitivity troponin I (hsTnI) values and significant  changes across serial measurements may suggest ACS but many other  chronic and acute conditions are known to elevate hsTnI results.  Refer to the "Links" section for chest pain algorithms and additional  guidance. Performed at [facility], [place] [address] [place], [place] [address]     CT ABDOMEN PELVIS WO CONTRAST  Result Date: 01/02/2022 CLINICAL DATA:  Diarrhea and abdominal pain. EXAM: CT ABDOMEN AND PELVIS WITHOUT CONTRAST TECHNIQUE: Multidetector CT imaging of the abdomen and pelvis was performed following the standard protocol without IV contrast. RADIATION DOSE REDUCTION: This exam was performed according to the departmental dose-optimization program which includes automated exposure control, adjustment of the mA and/or kV according to patient size and/or use of iterative reconstruction technique. COMPARISON:  CT abdomen and pelvis 01/08/2008. MRI abdomen 10/21/2009. FINDINGS: Lower chest: There is atelectasis in the lung bases. Hepatobiliary: Hypodense lesions in the left lobe of the liver are again seen and have increased in size with the largest now measuring 2.3 cm, characterized  as a cyst. Hypodense lesion in the dome of the liver measures 3.6 x 1.4 cm measuring similar to prior MRI in 2010. This is previously characterized as atypical hemangioma. Gallstones and sludge are present. No biliary ductal dilatation. Pancreas: Unremarkable. No pancreatic ductal dilatation or surrounding inflammatory changes. Spleen: Normal in size without focal abnormality. Adrenals/Urinary Tract: Adrenal glands are unremarkable. Kidneys are normal, without renal calculi, focal lesion, or hydronephrosis. Bladder is unremarkable. Stomach/Bowel: There is wall thickening and inflammation of the splenic flexure, descending colon and proximal sigmoid colon compatible with colitis. There is a small amount of free air throughout the abdomen and pelvis compatible with bowel perforation. Exact site of perforation is not visualized. There is no bowel obstruction. The appendix is not seen. Small bowel and stomach appear within normal limits. No pneumatosis. No portal venous gas identified. Vascular/Lymphatic: Aortic atherosclerosis. No enlarged abdominal or pelvic lymph nodes. Reproductive: Status post hysterectomy. No adnexal masses. Other: There is a small amount of free air in the abdomen and pelvis. There is wide-mouth umbilical hernia. No abscess identified. Musculoskeletal: There is some subcutaneous scarring in the anterior thighs bilaterally. L3, L4, L5 posterior fusion hardware appears uncomplicated. Bilateral hip arthroplasties are present. No acute fractures are seen. IMPRESSION: 1. Free air compatible with bowel perforation. Exact site of bowel perforation is not visualized, but is likely from the colon. There are nonspecific colitis findings involving the splenic flexure to the level of the sigmoid colon. No abscess. 2. Cholelithiasis and gallbladder sludge. 3.  Aortic Atherosclerosis (ICD10-I70.0). These results were called by telephone at the time of interpretation on 01/02/2022 at 7:21 pm to provider DAVID  YAO , who verbally acknowledged these results. Electronically Signed   By: Ronney Asters M.D.   On: 01/02/2022 19:22    I have personally reviewed the relevant previous medical history as well as the current CT scan  A/P: Colitis with hypotension and dehydration Acute kidney injury Small amount of pneumoperitoneum  Surprisingly her abdominal exam does not show frank peritonitis.  The CT  scan does not show any pneumatosis or portal venous gas.  There is also no free fluid and only a small amount of free air.  The exact site of the perforation is not clear by radiology. I discussed the CT findings with her and her husband.  I discussed emergent exploration with the possible need for a bowel resection and ostomy and prolonged ventilation post op vs an attempt at conservative management.  I believe she needs admission to the ICU by critical care and aggressive rehydration and resuscitation.   Despite IV fluids, she is remained hypotensive with only a heart rate in the 80s and this may be secondary to her current HTN medications.  2 troponins have been drawn and are normal.  Lactic acid level is pending.  She and her husband would like to try conservative management. After further resuscitation, we will reevaluate her abdomen and determine whether or not she needs an exploration, resection, and likely colostomy.  High complex medical decision making  Coralie Keens, MD [facility], [facility]

## 2022-01-02 NOTE — ED Notes (Signed)
Pt sleeping in bed, pale. Pt arrousable, a/ox4. Cold peripheral skin, delayed cap refill. Per spouse, pt had been constipated x 3-4 weeks, was given a bowel prep by GI and had several BMs today with syncope and hypotension. Pt has distended abd, soft, nontender.

## 2022-01-02 NOTE — Progress Notes (Signed)
Pt being tx to ICU per primary RN. Will start US guided PIV for vasopressors once pt is settled on ICU unit.

## 2022-01-02 NOTE — ED Provider Notes (Addendum)
[facility] [place] DEPT Provider Note   CSN: [id number] Arrival date & time: 01/02/22  1628     History  Chief Complaint  Patient presents with   Loss of Consciousness    Terri Rodriguez is a 76 y.o. female here with loss of consciousness.  Patient states that she has been very constipated.  She states that she called her GI doctor.  She was initially given Dulcolax.  She then was prescribed " bowel cleanser".  She started taking it 2 days ago.  Patient had 1 large bowel movement today.  She was in her kitchen and had the accident and felt very dizzy.  Patient had no head injury.  She just laid on the floor.  Denies any abdominal pain currently.  EMS noticed that her blood pressure was in the 60s.  Patient was given 1 L normal saline prior to arrival for  The history is provided by the patient.      Home Medications Prior to Admission medications   Medication Sig Start Date End Date Taking? Authorizing Provider  amLODipine (NORVASC) 5 MG tablet TAKE 1 TABLET BY MOUTH EVERY DAY Patient taking differently: Take 5 mg by mouth daily. 04/07/21  Yes Troy Sine, MD  amoxicillin (AMOXIL) 500 MG capsule Take 2,000 mg by mouth See admin instructions. 1 hour prior to dental procedures.   Yes [provider]  aspirin 81 MG tablet Take 81 mg by mouth daily.   Yes [provider]  atorvastatin (LIPITOR) 20 MG tablet TAKE 1 TABLET BY MOUTH EVERY DAY AT 6 PM Patient taking differently: Take 20 mg by mouth daily. 10/19/21  Yes Troy Sine, MD  Azelaic Acid 15 % cream Apply 1 application topically 2 (two) times daily as needed (rosacea). After skin is thoroughly washed and patted dry, gently but thoroughly massage a thin film of azelaic acid cream into the affected area t daily,after shower.   Yes [provider]  carvedilol (COREG) 3.125 MG tablet Take 1 tablet by mouth 2 times daily with a meal. Patient taking differently: Take 3.125 mg by mouth  2 (two) times daily with a meal. 10/19/21  Yes Troy Sine, MD  celecoxib (CELEBREX) 200 MG capsule Take 200 mg by mouth daily.   Yes [provider]  ciprofloxacin (CIPRO) 500 MG tablet Take 1 tablet (500 mg total) by mouth 2 (two) times daily. Patient taking differently: Take 500 mg by mouth 2 (two) times daily. Start date : 12/28/21 12/28/21  Yes Laurey Morale, MD  clindamycin (CLEOCIN T) 1 % external solution 1 application 2 (two) times daily as needed (for scalp). 03/29/15  Yes [provider]  clobetasol (TEMOVATE) 0.05 % external solution Apply 1 application topically daily as needed (itching). 01/06/21  Yes [provider]  diclofenac sodium (VOLTAREN) 1 % GEL Apply 2-4 g topically 4 (four) times daily. As needed for  Arthritis  Pain. Patient taking differently: Apply 2-4 g topically 4 (four) times daily as needed (pain). 11/08/17  Yes Panosh, Standley Brooking, MD  DULoxetine (CYMBALTA) 60 MG capsule TAKE 1 CAPSULE BY MOUTH EVERY EVENING Patient taking differently: 60 mg daily. 12/29/21  Yes Panosh, Standley Brooking, MD  estradiol (ESTRACE) 0.5 MG tablet 1/2 tab daily Patient taking differently: Take 0.25 mg by mouth every other day. 08/07/20  Yes Megan Salon, MD  fexofenadine (ALLEGRA) 180 MG tablet Take 180 mg by mouth daily.   Yes [provider]  fluocinonide (LIDEX) 0.05 %  external solution 1 application daily as needed (scalp). 10/20/17  Yes [provider]  ketoconazole (NIZORAL) 2 % cream 1 application daily as needed for irritation. 12/12/19  Yes [provider]  linaclotide Rolan Lipa) 290 MCG CAPS capsule Take 1 capsule (290 mcg total) by mouth daily before breakfast. 12/30/21  Yes Lemmon, Lavone Nian, PA  metroNIDAZOLE (METROGEL) 1.82 % gel 1 application daily as needed. 05/25/19  Yes [provider]  nitroGLYCERIN (NITROSTAT) 0.4 MG SL tablet PLACE 1 TABLET UNDER THE TONGUE EVERY 5 MINUTES AS NEEDED FOR CHEST PAIN Patient taking  differently: 0.4 mg every 5 (five) minutes as needed for chest pain. 12/23/20  Yes Troy Sine, MD  omeprazole (PRILOSEC) 20 MG capsule Take 40 mg by mouth daily.   Yes [provider]  pregabalin (LYRICA) 50 MG capsule Take orally weaning with 100mg  ; !00 mg 50 mg 100 mg , per day  for one week ,then decrease to 100 -50 -50 mg per day, then 50 mg tid or as directed Patient taking differently: Take 50 mg by mouth 3 (three) times daily. At 7 am, 3 pm and 11 pm 12/02/21  Yes Panosh, Standley Brooking, MD  psyllium (REGULOID) 0.52 g capsule Take 0.52 g by mouth 2 (two) times daily.   Yes [provider]  ramipril (ALTACE) 10 MG capsule TAKE 1 CAPSULE BY MOUTH EVERY DAY Patient taking differently: Take 10 mg by mouth daily. 10/19/21  Yes Troy Sine, MD  valACYclovir (VALTREX) 1000 MG tablet TAKE 2 TABLET BY MOUTH Q12 hours X 2 DOSES WITH FEVER BLISTERS Patient taking differently: Take 1,000 mg by mouth daily as needed (breakouts). 12/25/20  Yes Panosh, Standley Brooking, MD      Allergies    Codeine and Otho Darner allergy]    Review of Systems   Review of Systems  Neurological:  Positive for syncope.  All other systems reviewed and are negative.  Physical Exam Updated Vital Signs BP (!) 88/41    Pulse 77    Temp 97.8 F (36.6 C) (Oral)    Resp 17    SpO2 97%  Physical Exam Vitals and nursing note reviewed.  Constitutional:      Comments: Dehydrated  HENT:     Head: Normocephalic.     Nose: Nose normal.     Mouth/Throat:     Mouth: Mucous membranes are dry.  Eyes:     Extraocular Movements: Extraocular movements intact.     Pupils: Pupils are equal, round, and reactive to light.  Cardiovascular:     Rate and Rhythm: Normal rate and regular rhythm.     Pulses: Normal pulses.     Heart sounds: Normal heart sounds.  Pulmonary:     Effort: Pulmonary effort is normal.     Breath sounds: Normal breath sounds.  Abdominal:     General: Abdomen is flat.     Palpations: Abdomen  is soft.     Comments: Nontender, not distended   Musculoskeletal:        General: Normal range of motion.     Cervical back: Normal range of motion and neck supple.  Skin:    General: Skin is warm.     Capillary Refill: Capillary refill takes less than 2 seconds.  Neurological:     General: No focal deficit present.     Mental Status: She is oriented to person, place, and time.  Psychiatric:        Mood and Affect: Mood normal.  Behavior: Behavior normal.    ED Results / Procedures / Treatments   Labs (all labs ordered are listed, but only abnormal results are displayed) Labs Reviewed  CBC WITH DIFFERENTIAL/PLATELET - Abnormal; Notable for the following components:      Result Value   WBC 12.3 (*)    RBC 3.45 (*)    Hemoglobin 10.3 (*)    HCT 32.3 (*)    Neutro Abs 10.2 (*)    Monocytes Absolute 1.1 (*)    All other components within normal limits  COMPREHENSIVE METABOLIC PANEL - Abnormal; Notable for the following components:   Sodium 132 (*)    CO2 21 (*)    Glucose, Bld 131 (*)    BUN 51 (*)    Creatinine, Ser 2.37 (*)    Calcium 7.7 (*)    Total Protein 5.6 (*)    Albumin 2.7 (*)    GFR, Estimated 21 (*)    All other components within normal limits  CULTURE, BLOOD (ROUTINE X 2)  CULTURE, BLOOD (ROUTINE X 2)  RESP PANEL BY RT-PCR (FLU A&B, COVID) ARPGX2  URINALYSIS, ROUTINE W REFLEX MICROSCOPIC  LACTIC ACID, PLASMA  LACTIC ACID, PLASMA  TROPONIN I (HIGH SENSITIVITY)  TROPONIN I (HIGH SENSITIVITY)    EKG EKG Interpretation  Date/Time:  Sunday January 02 2022 16:48:17 EST Ventricular Rate:  69 PR Interval:  158 QRS Duration: 116 QT Interval:  430 QTC Calculation: 461 R Axis:   12 Text Interpretation: Sinus rhythm Ventricular premature complex Probable left atrial enlargement Nonspecific intraventricular conduction delay Low voltage, precordial leads Borderline T abnormalities, anterior leads No significant change since last tracing Confirmed by Wandra Arthurs [phone]) on 01/02/2022 5:02:43 PM  Radiology CT ABDOMEN PELVIS WO CONTRAST  Result Date: 01/02/2022 CLINICAL DATA:  Diarrhea and abdominal pain. EXAM: CT ABDOMEN AND PELVIS WITHOUT CONTRAST TECHNIQUE: Multidetector CT imaging of the abdomen and pelvis was performed following the standard protocol without IV contrast. RADIATION DOSE REDUCTION: This exam was performed according to the departmental dose-optimization program which includes automated exposure control, adjustment of the mA and/or kV according to patient size and/or use of iterative reconstruction technique. COMPARISON:  CT abdomen and pelvis 01/08/2008. MRI abdomen 10/21/2009. FINDINGS: Lower chest: There is atelectasis in the lung bases. Hepatobiliary: Hypodense lesions in the left lobe of the liver are again seen and have increased in size with the largest now measuring 2.3 cm, characterized as a cyst. Hypodense lesion in the dome of the liver measures 3.6 x 1.4 cm measuring similar to prior MRI in 2010. This is previously characterized as atypical hemangioma. Gallstones and sludge are present. No biliary ductal dilatation. Pancreas: Unremarkable. No pancreatic ductal dilatation or surrounding inflammatory changes. Spleen: Normal in size without focal abnormality. Adrenals/Urinary Tract: Adrenal glands are unremarkable. Kidneys are normal, without renal calculi, focal lesion, or hydronephrosis. Bladder is unremarkable. Stomach/Bowel: There is wall thickening and inflammation of the splenic flexure, descending colon and proximal sigmoid colon compatible with colitis. There is a small amount of free air throughout the abdomen and pelvis compatible with bowel perforation. Exact site of perforation is not visualized. There is no bowel obstruction. The appendix is not seen. Small bowel and stomach appear within normal limits. No pneumatosis. No portal venous gas identified. Vascular/Lymphatic: Aortic atherosclerosis. No enlarged abdominal or  pelvic lymph nodes. Reproductive: Status post hysterectomy. No adnexal masses. Other: There is a small amount of free air in the abdomen and pelvis. There is wide-mouth umbilical hernia. No abscess identified.  Musculoskeletal: There is some subcutaneous scarring in the anterior thighs bilaterally. L3, L4, L5 posterior fusion hardware appears uncomplicated. Bilateral hip arthroplasties are present. No acute fractures are seen. IMPRESSION: 1. Free air compatible with bowel perforation. Exact site of bowel perforation is not visualized, but is likely from the colon. There are nonspecific colitis findings involving the splenic flexure to the level of the sigmoid colon. No abscess. 2. Cholelithiasis and gallbladder sludge. 3.  Aortic Atherosclerosis (ICD10-I70.0). These results were called by telephone at the time of interpretation on 01/02/2022 at 7:21 pm to provider Elisse Pennick , who verbally acknowledged these results. Electronically Signed   By: Ronney Asters M.D.   On: 01/02/2022 19:22    Procedures Procedures  CRITICAL CARE Performed by: Wandra Arthurs   Total critical care time: 30 minutes  Critical care time was exclusive of separately billable procedures and treating other patients.  Critical care was necessary to treat or prevent imminent or life-threatening deterioration.  Critical care was time spent personally by me on the following activities: development of treatment plan with patient and/or surrogate as well as nursing, discussions with consultants, evaluation of patient's response to treatment, examination of patient, obtaining history from patient or surrogate, ordering and performing treatments and interventions, ordering and review of laboratory studies, ordering and review of radiographic studies, pulse oximetry and re-evaluation of patient's condition.  Angiocath insertion Performed by: Wandra Arthurs  Consent: Verbal consent obtained. Risks and benefits: risks, benefits and alternatives  were discussed Time out: Immediately prior to procedure a "time out" was called to verify the correct patient, procedure, equipment, support staff and site/side marked as required.  Preparation: Patient was prepped and draped in the usual sterile fashion.  Vein Location: L antecube  Ultrasound Guided  Gauge: 20 long   Normal blood return and flush without difficulty Patient tolerance: Patient tolerated the procedure well with no immediate complications.    Medications Ordered in ED Medications  sodium chloride 0.9 % bolus 1,000 mL (0 mLs Intravenous Stopped 01/02/22 1827)  sodium chloride 0.9 % bolus 1,000 mL (0 mLs Intravenous Stopped 01/02/22 2007)  piperacillin-tazobactam (ZOSYN) IVPB 3.375 g (3.375 g Intravenous New Bag/Given 01/02/22 2006)  sodium chloride 0.9 % bolus 1,000 mL (1,000 mLs Intravenous New Bag/Given 01/02/22 2003)    ED Course/ Medical Decision Making/ A&P                           Medical Decision Making Terri Rodriguez is a 76 y.o. female here presenting with hypotension, near syncope.  Patient had a large bowel movement after bowel prep for constipation.  Patient likely has vasovagal syncope.  Also consider dehydration or renal failure or arrhythmia.  Will get EKG and we will also get CBC and CMP and troponin.  Will hydrate patient and reassess   8 pm Labs reviewed and she has acute renal failure with a creatinine is 2.3.  Also white blood cell count of 12.  I ordered CT abdomen pelvis to evaluate for acute renal failure.  CT showed free air with bowel perforation.  I consulted Dr. Ninfa Linden from surgery  8:52 PM Dr. Ninfa Linden evaluated patient.  He states that patient does not need emergency surgery right now.  He would rather patient get IV antibiotics for now.  He recommend ICU admission.  Consult ICU to admit.   Problems Addressed: Perforation bowel [facility]): acute illness or injury Septic shock [facility]): acute illness or injury  Amount and/or Complexity of Data  Reviewed Independent Historian: EMS External Data Reviewed: radiology, ECG and notes. Labs: ordered. Decision-making details documented in ED Course. Radiology: ordered and independent interpretation performed. Decision-making details documented in ED Course. ECG/medicine tests: ordered and independent interpretation performed. Decision-making details documented in ED Course.  Risk Prescription drug management. Decision regarding hospitalization. Emergency major surgery.    Final Clinical Impression(s) / ED Diagnoses Final diagnoses:  Perforation bowel ([place])  Septic shock [facility])    Rx / DC Orders ED Discharge Orders     None         Drenda Freeze, MD 01/02/22 2056    Drenda Freeze, MD 01/02/22 [phone]

## 2022-01-02 NOTE — Progress Notes (Signed)
Pharmacy Antibiotic Note  Terri Rodriguez is a 76 y.o. female admitted on 01/02/2022 with  syncopal episode.  She has been having several weeks of constipation.  Pharmacy has been consulted to dose zosyn for intra-abdominal infection.  Plan: Zosyn 3.375g IV Q8H infused over 4hrs. Follow renal function and clinical course  Weight: 120 kg (264 lb 8.8 oz)  Temp (24hrs), Avg:97.7 F (36.5 C), Min:97.6 F (36.4 C), Max:97.8 F (36.6 C)  Recent Labs  Lab 01/02/22 1634  WBC 12.3*  CREATININE 2.37*    Estimated Creatinine Clearance: 26.1 mL/min (A) (by C-G formula based on SCr of 2.37 mg/dL (H)).    Allergies  Allergen Reactions   Codeine     nausea   Crab [Shellfish Allergy] Itching and Nausea And Vomiting      Thank you for allowing pharmacy to be a part of this patients care. Dolly Rias RPh 01/02/2022, 11:39 PM

## 2022-01-02 NOTE — ED Triage Notes (Signed)
Pt arrives from home via [facility] with reports of syncope. Pt states she is doing a bowl prep and she had a BM followed by a syncopal episode. Pt was hypotensive upon Ems arrival with systolic in the 42P.

## 2022-01-02 NOTE — Progress Notes (Signed)
eLink Physician-Brief Progress Note Patient Name: Terri Rodriguez DOB: Feb 21, 1946 MRN: [id number]   Date of Service  01/02/2022  HPI/Events of Note  76 year old woman admitted with bowel perforation, recent constipation, AKI, hypotension and now needing Levophed infusion. Has arrived from the ER on 5 mic/min and has MAP of 76 at this time. No distress on camera. PCCM aware and admitting. Their note is pending. Surgery on consult.   eICU Interventions  RN noted patient has urge to urinate but unable to at this time. Bladder scan with over 77 cc. Straight cath x 1 for now. Planned for npo, fluids, antibiotics, pressors to keep map > 65. Call E link if needed     Intervention Category Major Interventions: Shock - evaluation and management  Vivek Grealish G Kassi Esteve 01/02/2022, 11:40 PM

## 2022-01-02 NOTE — ED Notes (Signed)
Admit MD at bedside

## 2022-01-03 ENCOUNTER — Ambulatory Visit (HOSPITAL_COMMUNITY)
Admission: RE | Admit: 2022-01-03 | Discharge: 2022-01-03 | Disposition: A | Discharge status: Home / Self Care | Payer: Medicare Other | Source: Ambulatory Visit | Attending: Gastroenterology | Admitting: Gastroenterology

## 2022-01-03 ENCOUNTER — Encounter: Payer: Self-pay | Admitting: Internal Medicine

## 2022-01-03 ENCOUNTER — Inpatient Hospital Stay (HOSPITAL_COMMUNITY): Payer: Medicare Other | Admitting: Certified Registered"

## 2022-01-03 ENCOUNTER — Other Ambulatory Visit: Payer: Self-pay

## 2022-01-03 ENCOUNTER — Inpatient Hospital Stay (HOSPITAL_COMMUNITY): Payer: Medicare Other

## 2022-01-03 ENCOUNTER — Encounter (HOSPITAL_COMMUNITY)
Admission: EM | Disposition: A | Discharge status: Home Health | Payer: Self-pay | Source: Home / Self Care | Attending: Internal Medicine

## 2022-01-03 DIAGNOSIS — K631 Perforation of intestine (nontraumatic): Secondary | ICD-10-CM | POA: Diagnosis not present

## 2022-01-03 HISTORY — PX: COLOSTOMY: SHX63

## 2022-01-03 HISTORY — PX: LAPAROTOMY: SHX154

## 2022-01-03 HISTORY — PX: COLON RESECTION: SHX5231

## 2022-01-03 LAB — URINALYSIS, ROUTINE W REFLEX MICROSCOPIC
Bilirubin Urine: NEGATIVE
Glucose, UA: NEGATIVE mg/dL
Hgb urine dipstick: NEGATIVE
Ketones, ur: NEGATIVE mg/dL
Leukocytes,Ua: NEGATIVE
Nitrite: NEGATIVE
Protein, ur: NEGATIVE mg/dL
Specific Gravity, Urine: 1.008 (ref 1.005–1.030)
pH: 5 (ref 5.0–8.0)

## 2022-01-03 LAB — TYPE AND SCREEN
ABO/RH(D): O NEG
Antibody Screen: NEGATIVE

## 2022-01-03 LAB — PROCALCITONIN: Procalcitonin: 0.64 ng/mL

## 2022-01-03 LAB — MRSA NEXT GEN BY PCR, NASAL: MRSA by PCR Next Gen: NOT DETECTED

## 2022-01-03 LAB — CBC
HCT: 33 % — ABNORMAL LOW (ref 36.0–46.0)
Hemoglobin: 11.1 g/dL — ABNORMAL LOW (ref 12.0–15.0)
MCH: 31.3 pg (ref 26.0–34.0)
MCHC: 33.6 g/dL (ref 30.0–36.0)
MCV: 93 fL (ref 80.0–100.0)
Platelets: 305 10*3/uL (ref 150–400)
RBC: 3.55 MIL/uL — ABNORMAL LOW (ref 3.87–5.11)
RDW: 14 % (ref 11.5–15.5)
WBC: 11.7 10*3/uL — ABNORMAL HIGH (ref 4.0–10.5)
nRBC: 0 % (ref 0.0–0.2)

## 2022-01-03 LAB — LACTIC ACID, PLASMA: Lactic Acid, Venous: 1.4 mmol/L (ref 0.5–1.9)

## 2022-01-03 LAB — CREATININE, SERUM
Creatinine, Ser: 1.79 mg/dL — ABNORMAL HIGH (ref 0.44–1.00)
GFR, Estimated: 29 mL/min — ABNORMAL LOW (ref >60)

## 2022-01-03 SURGERY — LAPAROTOMY, EXPLORATORY
Anesthesia: General

## 2022-01-03 MED ORDER — SODIUM CHLORIDE 0.9 % IV SOLN: INTRAVENOUS | Status: AC

## 2022-01-03 MED ORDER — LIDOCAINE 2% (20 MG/ML) 5 ML SYRINGE
INTRAMUSCULAR | Status: DC | PRN
  Administered 2022-01-03: 60 mg via INTRAVENOUS

## 2022-01-03 MED ORDER — DEXAMETHASONE SODIUM PHOSPHATE 10 MG/ML IJ SOLN
INTRAMUSCULAR | Status: DC | PRN
Start: 2022-01-03 — End: 2022-01-03
  Administered 2022-01-03: 4 mg via INTRAVENOUS

## 2022-01-03 MED ORDER — FENTANYL CITRATE (PF) 250 MCG/5ML IJ SOLN
INTRAMUSCULAR | Status: AC
  Filled 2022-01-03: qty 5

## 2022-01-03 MED ORDER — HYDROMORPHONE HCL 2 MG/ML IJ SOLN
INTRAMUSCULAR | Status: AC
  Filled 2022-01-03: qty 1

## 2022-01-03 MED ORDER — METHOCARBAMOL 1000 MG/10ML IJ SOLN
500.0000 mg | Freq: Four times a day (QID) | INTRAVENOUS | Status: DC | PRN
  Filled 2022-01-03 (×2): qty 5

## 2022-01-03 MED ORDER — ROCURONIUM BROMIDE 10 MG/ML (PF) SYRINGE
PREFILLED_SYRINGE | INTRAVENOUS | Status: DC | PRN
  Administered 2022-01-03: 60 mg via INTRAVENOUS
  Administered 2022-01-03: 10 mg via INTRAVENOUS

## 2022-01-03 MED ORDER — FENTANYL CITRATE (PF) 100 MCG/2ML IJ SOLN
INTRAMUSCULAR | Status: DC | PRN
  Administered 2022-01-03 (×2): 100 ug via INTRAVENOUS

## 2022-01-03 MED ORDER — ETOMIDATE 2 MG/ML IV SOLN
INTRAVENOUS | Status: AC
  Filled 2022-01-03: qty 10

## 2022-01-03 MED ORDER — SUCCINYLCHOLINE CHLORIDE 200 MG/10ML IV SOSY
PREFILLED_SYRINGE | INTRAVENOUS | Status: DC | PRN
  Administered 2022-01-03: 100 mg via INTRAVENOUS

## 2022-01-03 MED ORDER — ARTIFICIAL TEARS OPHTHALMIC OINT
TOPICAL_OINTMENT | OPHTHALMIC | Status: AC
  Filled 2022-01-03: qty 3.5

## 2022-01-03 MED ORDER — ONDANSETRON HCL 4 MG/2ML IJ SOLN
INTRAMUSCULAR | Status: AC
  Filled 2022-01-03: qty 2

## 2022-01-03 MED ORDER — HYDROMORPHONE HCL 1 MG/ML IJ SOLN
0.5000 mg | INTRAMUSCULAR | Status: DC | PRN
  Administered 2022-01-03: 0.5 mg via INTRAVENOUS
  Filled 2022-01-03: qty 1

## 2022-01-03 MED ORDER — PHENYLEPHRINE HCL-NACL 20-0.9 MG/250ML-% IV SOLN
INTRAVENOUS | Status: DC | PRN
  Administered 2022-01-03: 30 ug/min via INTRAVENOUS

## 2022-01-03 MED ORDER — ACETAMINOPHEN 325 MG PO TABS: 650.0000 mg | ORAL_TABLET | Freq: Four times a day (QID) | ORAL | Status: DC

## 2022-01-03 MED ORDER — ACETAMINOPHEN 10 MG/ML IV SOLN
1000.0000 mg | Freq: Four times a day (QID) | INTRAVENOUS | Status: AC
  Administered 2022-01-03 – 2022-01-04 (×4): 1000 mg via INTRAVENOUS
  Filled 2022-01-03 (×4): qty 100

## 2022-01-03 MED ORDER — PROPOFOL 10 MG/ML IV BOLUS
INTRAVENOUS | Status: AC
  Filled 2022-01-03: qty 20

## 2022-01-03 MED ORDER — SUGAMMADEX SODIUM 200 MG/2ML IV SOLN
INTRAVENOUS | Status: DC | PRN
  Administered 2022-01-03: 250 mg via INTRAVENOUS

## 2022-01-03 MED ORDER — ROCURONIUM BROMIDE 10 MG/ML (PF) SYRINGE
PREFILLED_SYRINGE | INTRAVENOUS | Status: AC
  Filled 2022-01-03: qty 10

## 2022-01-03 MED ORDER — ONDANSETRON HCL 4 MG/2ML IJ SOLN
4.0000 mg | Freq: Four times a day (QID) | INTRAMUSCULAR | Status: DC
  Administered 2022-01-03 – 2022-01-07 (×14): 4 mg via INTRAVENOUS
  Filled 2022-01-03 (×14): qty 2

## 2022-01-03 MED ORDER — LACTATED RINGERS IV SOLN: INTRAVENOUS | Status: DC | PRN

## 2022-01-03 MED ORDER — HYDROMORPHONE HCL 1 MG/ML IJ SOLN
0.5000 mg | INTRAMUSCULAR | Status: DC | PRN
  Administered 2022-01-03 – 2022-01-04 (×2): 1 mg via INTRAVENOUS
  Filled 2022-01-03 (×2): qty 1

## 2022-01-03 MED ORDER — SUCCINYLCHOLINE CHLORIDE 200 MG/10ML IV SOSY
PREFILLED_SYRINGE | INTRAVENOUS | Status: AC
  Filled 2022-01-03: qty 10

## 2022-01-03 MED ORDER — KETAMINE HCL 10 MG/ML IJ SOLN
INTRAMUSCULAR | Status: DC | PRN
  Administered 2022-01-03: 40 mg via INTRAVENOUS

## 2022-01-03 MED ORDER — ETOMIDATE 2 MG/ML IV SOLN
INTRAVENOUS | Status: DC | PRN
  Administered 2022-01-03: 16 mg via INTRAVENOUS

## 2022-01-03 MED ORDER — KETAMINE HCL 10 MG/ML IJ SOLN
INTRAMUSCULAR | Status: AC
  Filled 2022-01-03: qty 1

## 2022-01-03 MED ORDER — ONDANSETRON HCL 4 MG/2ML IJ SOLN
INTRAMUSCULAR | Status: DC | PRN
  Administered 2022-01-03: 4 mg via INTRAVENOUS

## 2022-01-03 MED ORDER — DEXAMETHASONE SODIUM PHOSPHATE 10 MG/ML IJ SOLN
INTRAMUSCULAR | Status: AC
  Filled 2022-01-03: qty 1

## 2022-01-03 MED ORDER — LACTATED RINGERS IV SOLN: INTRAVENOUS | Status: DC

## 2022-01-03 MED ORDER — ALBUMIN HUMAN 5 % IV SOLN: INTRAVENOUS | Status: DC | PRN

## 2022-01-03 MED ORDER — SODIUM CHLORIDE 0.9 % IV SOLN: INTRAVENOUS | Status: DC

## 2022-01-03 MED ORDER — HEPARIN SODIUM (PORCINE) 5000 UNIT/ML IJ SOLN
5000.0000 [IU] | Freq: Three times a day (TID) | INTRAMUSCULAR | Status: DC
  Administered 2022-01-04 – 2022-01-10 (×19): 5000 [IU] via SUBCUTANEOUS
  Filled 2022-01-03 (×19): qty 1

## 2022-01-03 MED ORDER — HYDROMORPHONE HCL 1 MG/ML IJ SOLN
INTRAMUSCULAR | Status: DC | PRN
  Administered 2022-01-03: .2 mg via INTRAVENOUS
  Administered 2022-01-03 (×2): .4 mg via INTRAVENOUS

## 2022-01-03 SURGICAL SUPPLY — 36 items
APL PRP STRL LF DISP 70% ISPRP (MISCELLANEOUS) ×3
BINDER ABDOMINAL 12 ML 46-62 (SOFTGOODS) ×1 IMPLANT
CHLORAPREP W/TINT 26 (MISCELLANEOUS) ×1 IMPLANT
COVER MAYO STAND STRL (DRAPES) ×4 IMPLANT
COVER SURGICAL LIGHT HANDLE (MISCELLANEOUS) ×4 IMPLANT
DRAIN PENROSE 0.5X18 (DRAIN) ×1 IMPLANT
DRAPE LAPAROSCOPIC ABDOMINAL (DRAPES) ×4 IMPLANT
DRSG OPSITE POSTOP 4X12 (GAUZE/BANDAGES/DRESSINGS) ×1 IMPLANT
ELECT BLADE TIP CTD 4 INCH (ELECTRODE) ×1 IMPLANT
ELECT REM PT RETURN 15FT ADLT (MISCELLANEOUS) ×4 IMPLANT
GLOVE SURG ENC MOIS LTX SZ6 (GLOVE) ×8 IMPLANT
GLOVE SURG UNDER LTX SZ6.5 (GLOVE) ×8 IMPLANT
GOWN STRL REUS W/TWL LRG LVL3 (GOWN DISPOSABLE) ×4 IMPLANT
GOWN STRL REUS W/TWL XL LVL3 (GOWN DISPOSABLE) ×4 IMPLANT
KIT TURNOVER KIT A (KITS) ×1 IMPLANT
LIGASURE IMPACT 36 18CM CVD LR (INSTRUMENTS) ×1 IMPLANT
PACK GENERAL/GYN (CUSTOM PROCEDURE TRAY) ×4 IMPLANT
RELOAD PROXIMATE 75MM BLUE (ENDOMECHANICALS) ×4 IMPLANT
RELOAD STAPLE 75 3.8 BLU REG (ENDOMECHANICALS) IMPLANT
SLEEVE SURGEON STRL (DRAPES) ×1 IMPLANT
SPONGE T-LAP 18X18 ~~LOC~~+RFID (SPONGE) ×2 IMPLANT
STAPLER PROXIMATE 75MM BLUE (STAPLE) ×1 IMPLANT
STAPLER VISISTAT 35W (STAPLE) ×1 IMPLANT
SUT MNCRL AB 4-0 PS2 18 (SUTURE) ×1 IMPLANT
SUT PDS AB 2-0 CT2 27 (SUTURE) ×3 IMPLANT
SUT PROLENE 0 SH 30 (SUTURE) ×1 IMPLANT
SUT SILK 2 0 (SUTURE) ×4
SUT SILK 2 0 SH CR/8 (SUTURE) ×4 IMPLANT
SUT SILK 2-0 18XBRD TIE 12 (SUTURE) ×3 IMPLANT
SUT SILK 3 0 (SUTURE) ×4
SUT SILK 3 0 SH CR/8 (SUTURE) ×4 IMPLANT
SUT SILK 3-0 18XBRD TIE 12 (SUTURE) ×3 IMPLANT
SUT VIC AB 3-0 SH 8-18 (SUTURE) ×3 IMPLANT
TOWEL OR 17X26 10 PK STRL BLUE (TOWEL DISPOSABLE) ×1 IMPLANT
TOWEL OR NON WOVEN STRL DISP B (DISPOSABLE) ×4 IMPLANT
TRAY FOLEY MTR SLVR 14FR STAT (SET/KITS/TRAYS/PACK) ×4 IMPLANT

## 2022-01-03 NOTE — Telephone Encounter (Signed)
Inbound call from [facility] radiology. States patient is now admitted to ICU and the MRI can no longer be done as an outpatient. States the hospitalist can order for inpatient but want to let it known that she can not do it.

## 2022-01-03 NOTE — Telephone Encounter (Signed)
Noted. We are aware that patient is currently admitted.

## 2022-01-03 NOTE — Progress Notes (Addendum)
NAME:  Terri Rodriguez, MRN:  [id number], DOB:  [birth year], LOS: 1 ADMISSION DATE:  01/02/2022, CONSULTATION DATE:  01/02/21 REFERRING MD:  Dr. 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   Constipation x 3 weeks Started miralax daily 1/19.  1/24 trial of "bowel purge" with miralax - only scant BM  Dulcolax.  1/26: BP 90/40 AXR done  Started linzess  Bowel Prep sample given for her to take.  Started on Friday.   Per phone call note from 1/27, GI doc recommended "bowel purge" with miralax.   Today had large BM followed by syncope Hypotensive on arrival to 23R systolic.  1LNS prior to arrival   3L NS given in ED.   Receiving levophed, zosyn,  Cr 2.37 Ca corrected8.7 WBC elevated 12.3 from 3.1  + protein in urine from 1/24   Feels urgency to urinate but  cannot despite attempts.    Pertinent  Medical History  Rosacea NASH vs ASH +/- cirrhosis, abnormal LFTs  Hepatic menangioma (dating badk to 2009) Chronic constipation GERD  Hemorrhoids Allergic rhinits Cataract Cholelithiasis Complication of anesthesia  CAD  Diverticulitis Lateral epicondylitis HLD IBS  Chronic leukopenia Raynauds  Sleep apnea, on cpap Subacute thyroiditis Osteoarthrisis L R THRs,     Cipro bid since 1/24 for UTI Norvasc Lipitor Asa Coreg Ramipril  Cybalta  Estradiol Allegra Linzess daily  SL NTG prn  Lyrica Prilosec  Valtrex prn   Significant Hospital Events: Including procedures, antibiotic start and stop dates in addition to other pertinent events   1/29 Admitted overnight for hypotension briefly requiring vasopressor support  Interim History / Subjective:  Off vasopressors Good UOP This morning had BM with worsening  acute LLQ pain Objective   Blood pressure 107/75, pulse 76, temperature 97.8 F (36.6 C), temperature source Axillary, resp. rate (!) 21, weight 120 kg, SpO2 93 %.        Intake/Output Summary (Last 24 hours) at 01/03/2022 0837 Last data filed at 01/03/2022 0700 Gross per 24 hour  Intake 2008.86 ml  Output 1200 ml  Net 808.86 ml   Filed Weights   01/02/22 2314 01/03/22 0500  Weight: 120 kg 120 kg   Physical Exam: General: Uncomfortable, in pain HENT: [place], AT, OP clear, MMM Eyes: EOMI, no scleral icterus Respiratory: Clear to auscultation bilaterally.  No crackles, wheezing or rales Cardiovascular: RRR, grade I-II/VI SEM, no JVD GI: Hypoactive, firm but remains soft, LLQ tenderness Extremities:-Edema,-tenderness Neuro: AAO x4, CNII-XII grossly intact Psych: Normal mood, normal affect  CT abd/pelv: IMPRESSION: 1. Free air compatible with bowel perforation. Exact site of bowel perforation is not visualized, but is likely from the colon. There are nonspecific colitis findings involving the splenic flexure to the level of the sigmoid colon. No abscess. 2. Cholelithiasis and gallbladder sludge. 3.  Aortic Atherosclerosis (ICD10-I70.0).   Resolved Hospital Problem list     Assessment & Plan:  Septic/Hypovolemic shock - Resolved Colitis/bowel perforation - Very small amount of free air.   - Off pressors now. Low threshold to restart for Goal MAP >65 or SBP >90 - Cleared for ice chips per Surgery this morning - STAT KUB for abdominal pain. Surgery notified. - Continue Zosyn - Continue mIVF  AKI - improving - Monitor UOP/Cr  UTI recently  diagnosed and started on cipro 1/24 Urine retention - UA neg  - No further antibiotics  OSA: on CPAP at home -CPAP nightly   Best Practice (right click and "Reselect all SmartList Selections" daily)   Diet/type: NPO DVT prophylaxis: prophylactic heparin  GI prophylaxis: PPI Lines: N/A Foley:  N/A Code Status:  full code Last  date of multidisciplinary goals of care discussion []   Labs   CBC: Recent Labs  Lab 01/02/22 1634 01/03/22 0007  WBC 12.3* 11.7*  NEUTROABS 10.2*  --   HGB 10.3* 11.1*  HCT 32.3* 33.0*  MCV 93.6 93.0  PLT 262 673    Basic Metabolic Panel: Recent Labs  Lab 01/02/22 1634 01/03/22 0007  NA 132*  --   K 4.4  --   CL 101  --   CO2 21*  --   GLUCOSE 131*  --   BUN 51*  --   CREATININE 2.37* 1.79*  CALCIUM 7.7*  --    GFR: Estimated Creatinine Clearance: 34.5 mL/min (A) (by C-G formula based on SCr of 1.79 mg/dL (H)). Recent Labs  Lab 01/02/22 1634 01/03/22 0007  PROCALCITON  --  0.64  WBC 12.3* 11.7*  LATICACIDVEN  --  1.4    Liver Function Tests: Recent Labs  Lab 01/02/22 1634  AST 34  ALT 26  ALKPHOS 114  BILITOT 0.5  PROT 5.6*  ALBUMIN 2.7*   No results for input(s): LIPASE, AMYLASE in the last 168 hours. No results for input(s): AMMONIA in the last 168 hours.  ABG No results found for: PHART, PCO2ART, PO2ART, HCO3, TCO2, ACIDBASEDEF, O2SAT   Coagulation Profile: No results for input(s): INR, PROTIME in the last 168 hours.  Cardiac Enzymes: No results for input(s): CKTOTAL, CKMB, CKMBINDEX, TROPONINI in the last 168 hours.  HbA1C: Hgb A1c MFr Bld  Date/Time Value Ref Range Status  08/04/2021 09:05 AM 5.7 4.6 - 6.5 % Final    Comment:    Glycemic Control Guidelines for People with Diabetes:Non Diabetic:  <6%Goal of Therapy: <7%Additional Action Suggested:  >8%   10/15/2020 09:22 AM 5.2 <5.7 % of total Hgb Final    Comment:    For the purpose of screening for the presence of diabetes: . <5.7%       Consistent with the absence of diabetes 5.7-6.4%    Consistent with increased risk for diabetes             (prediabetes) > or =6.5%  Consistent with diabetes . This assay result is consistent with a decreased risk of diabetes. . Currently, no consensus exists regarding use of hemoglobin A1c for diagnosis of diabetes in children. . According  to American Diabetes Association (ADA) guidelines, hemoglobin A1c <7.0% represents optimal control in non-pregnant diabetic patients. Different metrics may apply to specific patient populations.  Standards of Medical Care in Diabetes(ADA). .     CBG: No results for input(s): GLUCAP in the last 168 hours.  Critical care time: 45 min   The patient is critically ill with multiple organ systems failure and requires high complexity decision making for assessment and support, frequent evaluation and titration of therapies, application of advanced monitoring technologies and extensive interpretation of multiple databases.    Rodman Pickle, M.D. [facility] Pulmonary/Critical Care Medicine 01/03/2022 8:37 AM   Please see Amion for pager number to reach on-call Pulmonary and Critical Care Team.

## 2022-01-03 NOTE — Consult Note (Signed)
[facility] Nurse ostomy consult note  [facility] Nurse requested for preoperative stoma site marking by Dr. Ninfa Linden.  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  Her only child, a daughter, Terri Rodriguez, is with her at the time of my visit.  Discussed surgical procedure and stoma creation with patient and family.  Explained role of the [facility] nurse team.    Examined patient lying. She has a rotund, tight abdomen that she reports is down about 5 inches since yesterday.  The marks are placed within the rectus muscle and it is our hope that should a stoma be created and placed here, that it would be within her field of vision.    Marked for transverse colostomy in the LUQ  4cm to the left of the umbilicus and 58XE above the umbilicus.  Marked for transverse colostomy in the RUQ  6cm to the right of the umbilicus and  94MH above the umbilicus.  Patient's abdomen cleansed with CHG wipes at site markings, allowed to air dry prior to marking. Covered mark with thin film transparent dressing to preserve mark until surgery.   [facility] Nurse team will follow up with patient after surgery for continue ostomy care and teaching.   Maudie Flakes, MSN, RN, [place], Arther Abbott  Pager# [phone]

## 2022-01-03 NOTE — Telephone Encounter (Signed)
RX sent on 12/29/2021

## 2022-01-03 NOTE — Anesthesia Procedure Notes (Signed)
Procedure Name: Intubation Date/Time: 01/03/2022 1:38 PM Performed by: Cleda Daub, CRNA Pre-anesthesia Checklist: Patient identified, Emergency Drugs available, Suction available and Patient being monitored Patient Re-evaluated:Patient Re-evaluated prior to induction Oxygen Delivery Method: Circle system utilized Preoxygenation: Pre-oxygenation with 100% oxygen Induction Type: IV induction, Rapid sequence and Cricoid Pressure applied Laryngoscope Size: Glidescope and 3 Grade View: Grade I Tube type: Oral Number of attempts: 1 Airway Equipment and Method: Stylet and Oral airway Placement Confirmation: ETT inserted through vocal cords under direct vision, positive ETCO2 and breath sounds checked- equal and bilateral Tube secured with: Tape Dental Injury: Teeth and Oropharynx as per pre-operative assessment

## 2022-01-03 NOTE — Telephone Encounter (Signed)
I called and spoke with patient regarding her x-ray results. She states that she is currently at [facility] being treated for perforation of bowel. Pt states that they are still running tests to see if she will need to have part of her colon removed.  I told her that I will have you review her records.

## 2022-01-03 NOTE — Brief Op Note (Signed)
01/03/2022  4:20 PM  PATIENT:  Terri Rodriguez  76 y.o. female  PRE-OPERATIVE DIAGNOSIS:  Perforated viscus  POST-OPERATIVE DIAGNOSIS:  Perforated splenic flexure diverticulitis  PROCEDURE:  Procedure(s): EXPLORATORY LAPAROTOMY (N/A) COLON RESECTION (Left) COLOSTOMY  SURGEON:  Surgeon(s) and Role:    * Leylani Duley, Nickola Major, MD - Primary  PHYSICIAN ASSISTANT:   ASSISTANTS: Barkley Boards, PA   ANESTHESIA:   general  EBL:  250 mL   BLOOD ADMINISTERED:none  DRAINS: Penrose drain in the subcutaneous space exiting the inferior aspect of the wound.    LOCAL MEDICATIONS USED:  NONE  SPECIMEN:  left colon - stitch marks proximal  DISPOSITION OF SPECIMEN:  PATHOLOGY  COUNTS:  YES correct  TOURNIQUET:  None  DICTATION: .Note written in EPIC  PLAN OF CARE: Admit to inpatient   PATIENT DISPOSITION:  PACU - hemodynamically stable.   Delay start of Pharmacological VTE agent (>24hrs) due to surgical blood loss or risk of bleeding: no

## 2022-01-03 NOTE — Telephone Encounter (Signed)
Noted, thanks!

## 2022-01-03 NOTE — Anesthesia Preprocedure Evaluation (Addendum)
Anesthesia Evaluation  Patient identified by MRN, date of birth, ID band Patient awake    Reviewed: Allergy & Precautions, NPO status , Patient's Chart, lab work & pertinent test results  History of Anesthesia Complications (+) PONV and history of anesthetic complications  Airway Mallampati: III  TM Distance: >3 FB Neck ROM: Full    Dental no notable dental hx. (+) Teeth Intact, Dental Advisory Given   Pulmonary sleep apnea and Continuous Positive Airway Pressure Ventilation ,    Pulmonary exam normal breath sounds clear to auscultation       Cardiovascular + CAD  Normal cardiovascular exam Rhythm:Regular Rate:Normal  2017 Echo  Ef 55-60% trivial AR   Neuro/Psych negative neurological ROS     GI/Hepatic hiatal hernia, GERD  ,IBS   Endo/Other  Morbid obesity (BMI 44.36)  Renal/GU ARFRenal diseaseLab Results      Component                Value               Date                      CREATININE               1.79 (H)            01/03/2022                BUN                      51 (H)              01/02/2022                NA                       132 (L)             01/02/2022                K                        4.4                 01/02/2022                CL                       101                 01/02/2022                CO2                      21 (L)              01/02/2022                Musculoskeletal  (+) Arthritis , Osteoarthritis,    Abdominal (+) + obese,   Peds  Hematology  (+) anemia , Lab Results      Component                Value               Date  WBC                      11.7 (H)            01/03/2022                HGB                      11.1 (L)            01/03/2022                HCT                      33.0 (L)            01/03/2022                MCV                      93.0                01/03/2022                PLT                      305                  01/03/2022              Anesthesia Other Findings ALL: codeine  Raynauds dz  Reproductive/Obstetrics                          Anesthesia Physical Anesthesia Plan  ASA: 3  Anesthesia Plan: General   Post-op Pain Management: Dilaudid IV and Ketamine IV   Induction: Rapid sequence, Cricoid pressure planned and Intravenous  PONV Risk Score and Plan: Treatment may vary due to age or medical condition  Airway Management Planned: Video Laryngoscope Planned and Oral ETT  Additional Equipment: None  Intra-op Plan:   Post-operative Plan: Extubation in OR  Informed Consent: I have reviewed the patients History and Physical, chart, labs and discussed the procedure including the risks, benefits and alternatives for the proposed anesthesia with the patient or authorized representative who has indicated his/her understanding and acceptance.     Dental advisory given  Plan Discussed with: CRNA and Anesthesiologist  Anesthesia Plan Comments:        Anesthesia Quick Evaluation

## 2022-01-03 NOTE — Progress Notes (Signed)
CPAP on hold  due to patient has NG tube and would be uncomfortable and won't be able to get good seal on mask nurse informed, pt on 4lpm cann spo2 93-94%

## 2022-01-03 NOTE — Progress Notes (Signed)
Consult received for US guided PIV due to vasopressors ordered. Bilateral arms thoroughly assessed with [place]. Veins are too small and too deep. Unable to obtain US guided PIV. Bedside RN notified. Recommend central line placement.

## 2022-01-03 NOTE — Progress Notes (Signed)
Chaplain engaged in an initial visit with Lorrene this morning along with her daughter Judson Roch.  Chaplain offered prayer with them as Evalette was preparing to have a procedure in the next hour.  Chaplain offered support and a compassionate presence.    01/03/22 1200  Clinical Encounter Type  Visited With Patient and family together  Visit Type Initial;Spiritual support  Spiritual Encounters  Spiritual Needs Prayer

## 2022-01-03 NOTE — Transfer of Care (Signed)
Immediate Anesthesia Transfer of Care Note  Patient: Terri Rodriguez  Procedure(s) Performed: EXPLORATORY LAPAROTOMY COLON RESECTION (Left) COLOSTOMY  Patient Location: PACU  Anesthesia Type:General  Level of Consciousness: drowsy  Airway & Oxygen Therapy: Patient Spontanous Breathing and Patient connected to nasal cannula oxygen  Post-op Assessment: Report given to RN and Post -op Vital signs reviewed and stable  Post vital signs: Reviewed and stable  Last Vitals:  Vitals Value Taken Time  BP 143/57 01/03/22 1635  Temp 37.2 C 01/03/22 1635  Pulse 88 01/03/22 1641  Resp 14 01/03/22 1641  SpO2 96 % 01/03/22 1641  Vitals shown include unvalidated device data.  Last Pain:  Vitals:   01/03/22 1635  TempSrc:   PainSc: Asleep         Complications: No notable events documented.

## 2022-01-03 NOTE — Progress Notes (Signed)
Progress Note: General Surgery Service   Chief Complaint/Subjective: Checked in around 8AM and again around 10 AM.  LLQ abdominal pain getting worse. Patient does not want surgery, but is in significant, worsening pain and understands she needs surgery.  Objective: Vital signs in last 24 hours: Temp:  [97.6 F (36.4 C)-97.8 F (36.6 C)] 97.8 F (36.6 C) (01/30 0500) Pulse Rate:  [69-92] 82 (01/30 0900) Resp:  [14-23] 20 (01/30 0930) BP: (65-127)/(27-108) 82/59 (01/30 0930) SpO2:  [91 %-99 %] 93 % (01/30 0900) Weight:  [120 kg] 120 kg (01/30 0500) Last BM Date: 01/02/22  Intake/Output from previous day: 01/29 0701 - 01/30 0700 In: 2008.9 [I.V.:928.7; IV Piggyback:1080.2] Out: 1200 [Urine:1200] Intake/Output this shift: No intake/output data recorded.  Constitutional: NAD; conversant; no deformities Eyes: Moist conjunctiva; no lid lag; anicteric; PERRL Neck: Trachea midline; no thyromegaly Lungs: Normal respiratory effort; no tactile fremitus CV: RRR; no palpable thrills; no pitting edema,  GI: Abd Severe LLQ tenderness with guarding MSK: Normal range of motion of extremities; no clubbing/cyanosis Psychiatric: Appropriate affect; alert and oriented x3 Lymphatic: No palpable cervical or axillary lymphadenopathy  Lab Results: CBC  Recent Labs    01/02/22 1634 01/03/22 0007  WBC 12.3* 11.7*  HGB 10.3* 11.1*  HCT 32.3* 33.0*  PLT 262 305   BMET Recent Labs    01/02/22 1634 01/03/22 0007  NA 132*  --   K 4.4  --   CL 101  --   CO2 21*  --   GLUCOSE 131*  --   BUN 51*  --   CREATININE 2.37* 1.79*  CALCIUM 7.7*  --    PT/INR No results for input(s): LABPROT, INR in the last 72 hours. ABG No results for input(s): PHART, HCO3 in the last 72 hours.  Invalid input(s): PCO2, PO2  Anti-infectives: Anti-infectives (From admission, onward)    Start     Dose/Rate Route Frequency Ordered Stop   01/03/22 0400  piperacillin-tazobactam (ZOSYN) IVPB 3.375 g         3.375 g 12.5 mL/hr over 240 Minutes Intravenous Every 8 hours 01/02/22 2337     01/02/22 1930  piperacillin-tazobactam (ZOSYN) IVPB 3.375 g        3.375 g 100 mL/hr over 30 Minutes Intravenous  Once 01/02/22 1922 01/02/22 2134       Medications: Scheduled Meds:  acetaminophen  650 mg Oral Q6H   Chlorhexidine Gluconate Cloth  6 each Topical Daily   heparin  5,000 Units Subcutaneous Q8H   mouth rinse  15 mL Mouth Rinse BID   ondansetron (ZOFRAN) IV  4 mg Intravenous Q6H   pantoprazole (PROTONIX) IV  40 mg Intravenous QHS   Continuous Infusions:  sodium chloride Stopped (01/02/22 2355)   sodium chloride 50 mL/hr at 01/03/22 1044   norepinephrine (LEVOPHED) Adult infusion 2 mcg/min (01/03/22 0600)   piperacillin-tazobactam (ZOSYN)  IV Stopped (01/03/22 0648)   PRN Meds:.docusate sodium, HYDROmorphone (DILAUDID) injection, ipratropium-albuterol, ondansetron (ZOFRAN) IV, polyethylene glycol  Assessment/Plan: Ms. Scheeler is a 76 year old female with abdominal pain, diarrhea and small amount of free air.  We attempted conservative management, but her pain is worsening this morning so I recommended exploratory laparotomy with possible colectomy and colostomy.  The procedure itself as was its risk, benefits, and alternatives were discussed with the patient.  After full discussion all questions answered the patient granted consent to proceed.  We will asked the wound ostomy nurse to mark the patient preoperatively.  We will proceed to the  operating room emergently.    LOS: 1 day   FEN: N.p.o. ID: Zosyn VTE: SCDs, Lovenox Foley: Will be placed during surgery Dispo: Continued care in the ICU    Felicie Morn, MD  [facility] Use [email or web address] to contact on call provider  Daily Billing: [phone] - High MDM

## 2022-01-04 ENCOUNTER — Encounter (HOSPITAL_COMMUNITY): Payer: Self-pay | Admitting: Surgery

## 2022-01-04 DIAGNOSIS — K631 Perforation of intestine (nontraumatic): Secondary | ICD-10-CM | POA: Diagnosis not present

## 2022-01-04 LAB — CBC
HCT: 32.4 % — ABNORMAL LOW (ref 36.0–46.0)
Hemoglobin: 10.7 g/dL — ABNORMAL LOW (ref 12.0–15.0)
MCH: 30.8 pg (ref 26.0–34.0)
MCHC: 33 g/dL (ref 30.0–36.0)
MCV: 93.4 fL (ref 80.0–100.0)
Platelets: 309 10*3/uL (ref 150–400)
RBC: 3.47 MIL/uL — ABNORMAL LOW (ref 3.87–5.11)
RDW: 14.7 % (ref 11.5–15.5)
WBC: 14.1 10*3/uL — ABNORMAL HIGH (ref 4.0–10.5)
nRBC: 0 % (ref 0.0–0.2)

## 2022-01-04 LAB — COMPREHENSIVE METABOLIC PANEL
ALT: 21 U/L (ref 0–44)
AST: 31 U/L (ref 15–41)
Albumin: 2.4 g/dL — ABNORMAL LOW (ref 3.5–5.0)
Alkaline Phosphatase: 86 U/L (ref 38–126)
Anion gap: 8 (ref 5–15)
BUN: 36 mg/dL — ABNORMAL HIGH (ref 8–23)
CO2: 20 mmol/L — ABNORMAL LOW (ref 22–32)
Calcium: 7.4 mg/dL — ABNORMAL LOW (ref 8.9–10.3)
Chloride: 111 mmol/L (ref 98–111)
Creatinine, Ser: 1.02 mg/dL — ABNORMAL HIGH (ref 0.44–1.00)
GFR, Estimated: 57 mL/min — ABNORMAL LOW (ref >60)
Glucose, Bld: 159 mg/dL — ABNORMAL HIGH (ref 70–99)
Potassium: 4.2 mmol/L (ref 3.5–5.1)
Sodium: 139 mmol/L (ref 135–145)
Total Bilirubin: 1 mg/dL (ref 0.3–1.2)
Total Protein: 5.2 g/dL — ABNORMAL LOW (ref 6.5–8.1)

## 2022-01-04 MED ORDER — QUETIAPINE FUMARATE 50 MG PO TABS
50.0000 mg | ORAL_TABLET | Freq: Every day | ORAL | Status: DC
  Administered 2022-01-06 – 2022-01-07 (×2): 50 mg
  Filled 2022-01-04 (×2): qty 1

## 2022-01-04 MED ORDER — FENTANYL CITRATE (PF) 100 MCG/2ML IJ SOLN
25.0000 ug | INTRAMUSCULAR | Status: DC | PRN
  Administered 2022-01-04 (×2): 25 ug via INTRAVENOUS
  Administered 2022-01-05 – 2022-01-06 (×6): 50 ug via INTRAVENOUS
  Filled 2022-01-04 (×9): qty 2

## 2022-01-04 MED ORDER — HALOPERIDOL LACTATE 5 MG/ML IJ SOLN
2.0000 mg | Freq: Four times a day (QID) | INTRAMUSCULAR | Status: DC | PRN
  Administered 2022-01-04 – 2022-01-05 (×3): 2 mg via INTRAVENOUS
  Filled 2022-01-04 (×3): qty 1

## 2022-01-04 MED ORDER — METHOCARBAMOL 1000 MG/10ML IJ SOLN
1000.0000 mg | Freq: Four times a day (QID) | INTRAVENOUS | Status: DC
  Administered 2022-01-04 – 2022-01-06 (×8): 1000 mg via INTRAVENOUS
  Filled 2022-01-04: qty 10
  Filled 2022-01-04 (×3): qty 1000
  Filled 2022-01-04: qty 10
  Filled 2022-01-04 (×3): qty 1000
  Filled 2022-01-04: qty 10
  Filled 2022-01-04 (×2): qty 1000

## 2022-01-04 MED ORDER — QUETIAPINE FUMARATE 50 MG PO TABS
50.0000 mg | ORAL_TABLET | Freq: Every day | ORAL | Status: DC
  Administered 2022-01-04: 50 mg via ORAL
  Filled 2022-01-04: qty 1

## 2022-01-04 NOTE — Anesthesia Postprocedure Evaluation (Signed)
Anesthesia Post Note  Patient: Terri Rodriguez  Procedure(s) Performed: EXPLORATORY LAPAROTOMY COLON RESECTION (Left) COLOSTOMY     Patient location during evaluation: PACU Anesthesia Type: General Level of consciousness: awake and alert Pain management: pain level controlled Vital Signs Assessment: post-procedure vital signs reviewed and stable Respiratory status: spontaneous breathing, nonlabored ventilation, respiratory function stable and patient connected to nasal cannula oxygen Cardiovascular status: blood pressure returned to baseline and stable Postop Assessment: no apparent nausea or vomiting Anesthetic complications: no   No notable events documented.  Last Vitals:  Vitals:   01/04/22 1556 01/04/22 1600  BP:  (!) 123/45  Pulse:  97  Resp:  16  Temp: 37 C 37 C  SpO2:  95%    Last Pain:  Vitals:   01/04/22 1600  TempSrc: Oral  PainSc:                  [place]et [place]

## 2022-01-04 NOTE — Progress Notes (Signed)
PT Cancellation Note  Patient Details Name: Terri Rodriguez MRN: [id number] DOB: [birth year]   Cancelled Treatment:    Reason Eval/Treat Not Completed: Fatigue/lethargy limiting ability to participate, patient had Haldol  this AM and lethargic. Will check back another time.    [place] Pager [phone] Office [phone]  Claretha Cooper 01/04/2022, 12:33 PM

## 2022-01-04 NOTE — Progress Notes (Signed)
NAME:  Terri Rodriguez, MRN:  [id number], DOB:  1945-12-24, LOS: 2 ADMISSION DATE:  01/02/2022, CONSULTATION DATE:  01/02/21 REFERRING MD:  Dr. 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   Constipation x 3 weeks Started miralax daily 1/19.  1/24 trial of "bowel purge" with miralax - only scant BM  Dulcolax.  1/26: BP 90/40 AXR done  Started linzess  Bowel Prep sample given for her to take.  Started on Friday.   Per phone call note from 1/27, GI doc recommended "bowel purge" with miralax.   Today had large BM followed by syncope Hypotensive on arrival to 93Y systolic.  1LNS prior to arrival   3L NS given in ED.   Receiving levophed, zosyn,  Cr 2.37 Ca corrected8.7 WBC elevated 12.3 from 3.1  + protein in urine from 1/24   Feels urgency to urinate but  cannot despite attempts.    Pertinent  Medical History  Rosacea NASH vs ASH +/- cirrhosis, abnormal LFTs  Hepatic menangioma (dating badk to 2009) Chronic constipation GERD  Hemorrhoids Allergic rhinits Cataract Cholelithiasis Complication of anesthesia  CAD  Diverticulitis Lateral epicondylitis HLD IBS  Chronic leukopenia Raynauds  Sleep apnea, on cpap Subacute thyroiditis Osteoarthrisis L R THRs,     Cipro bid since 1/24 for UTI Norvasc Lipitor Asa Coreg Ramipril  Cybalta  Estradiol Allegra Linzess daily  SL NTG prn  Lyrica Prilosec  Valtrex prn   Significant Hospital Events: Including procedures, antibiotic start and stop dates in addition to other pertinent events   1/29 Admitted overnight for hypotension briefly requiring vasopressor support 1/30 S/p left colectomy with colostomy for perforated bowel  Interim History / Subjective:   Returned to ICU post-op. This morning delirious and confused. Unable to wear home CPAP overnight due to NGT  Objective   Blood pressure (!) 123/43, pulse 93, temperature 98.3 F (36.8 C), temperature source Oral, resp. rate 14, weight 118.2 kg, SpO2 93 %.        Intake/Output Summary (Last 24 hours) at 01/04/2022 0826 Last data filed at 01/04/2022 0542 Gross per 24 hour  Intake 4292.87 ml  Output 1555 ml  Net 2737.87 ml   Filed Weights   01/02/22 2314 01/03/22 0500 01/04/22 0500  Weight: 120 kg 120 kg 118.2 kg   Physical Exam: General: Elderly female in bed, hyperactive delirium, confused HENT: [place], AT, OP clear, MMM, NGT in place Eyes: EOMI, no scleral icterus Respiratory: Clear to auscultation bilaterally.  No crackles, wheezing or rales Cardiovascular: RRR, -M/R/G, no JVD GI: left colostomy, soft, tender to palpation  Extremities:-Edema,-tenderness Neuro: Awake, confused, intermittently follows commands  Improving Cr Increasing WBC, likely post-op related  CT abd/pelv: IMPRESSION: 1. Free air compatible with bowel perforation. Exact site of bowel perforation is not visualized, but is likely from the colon. There are nonspecific colitis findings involving the splenic flexure to the level of the sigmoid colon. No abscess. 2. Cholelithiasis and gallbladder sludge. 3.  Aortic Atherosclerosis (ICD10-I70.0).   Resolved Hospital Problem list     Assessment & Plan:  Acute hyperactive delirium Acute encephalopathy - Re-orient - Pain control - Low dose haldol given once - Consider ABG if not improved with PAD management  Septic/Hypovolemic shock secondary to bowel perforation s/p left colectomy with  colostomy 1/30 - Off pressors 1/30 - Post op management per Surgery - Continue Zosyn - Continue mIVF  AKI - improving - Monitor UOP/Cr  UTI recently diagnosed and started on cipro 1/24 Urine retention - UA neg  - No further antibiotics  OSA: on CPAP at home -Hold  CPAP due to NGT -Continue supplemental O2 for goal >88%  Best Practice (right click and "Reselect all SmartList Selections" daily)   Diet/type: NPO DVT prophylaxis: prophylactic heparin  GI prophylaxis: PPI Lines: N/A Foley:  N/A Code Status:  full code Last date of multidisciplinary goals of care discussion []   Labs   CBC: Recent Labs  Lab 01/02/22 1634 01/03/22 0007 01/04/22 0245  WBC 12.3* 11.7* 14.1*  NEUTROABS 10.2*  --   --   HGB 10.3* 11.1* 10.7*  HCT 32.3* 33.0* 32.4*  MCV 93.6 93.0 93.4  PLT 262 305 268    Basic Metabolic Panel: Recent Labs  Lab 01/02/22 1634 01/03/22 0007 01/04/22 0245  NA 132*  --  139  K 4.4  --  4.2  CL 101  --  111  CO2 21*  --  20*  GLUCOSE 131*  --  159*  BUN 51*  --  36*  CREATININE 2.37* 1.79* 1.02*  CALCIUM 7.7*  --  7.4*   GFR: Estimated Creatinine Clearance: 60.1 mL/min (A) (by C-G formula based on SCr of 1.02 mg/dL (H)). Recent Labs  Lab 01/02/22 1634 01/03/22 0007 01/04/22 0245  PROCALCITON  --  0.64  --   WBC 12.3* 11.7* 14.1*  LATICACIDVEN  --  1.4  --     Liver Function Tests: Recent Labs  Lab 01/02/22 1634 01/04/22 0245  AST 34 31  ALT 26 21  ALKPHOS 114 86  BILITOT 0.5 1.0  PROT 5.6* 5.2*  ALBUMIN 2.7* 2.4*   No results for input(s): LIPASE, AMYLASE in the last 168 hours. No results for input(s): AMMONIA in the last 168 hours.  ABG No results found for: PHART, PCO2ART, PO2ART, HCO3, TCO2, ACIDBASEDEF, O2SAT   Coagulation Profile: No results for input(s): INR, PROTIME in the last 168 hours.  Cardiac Enzymes: No results for input(s): CKTOTAL, CKMB, CKMBINDEX, TROPONINI in the last 168 hours.  HbA1C: Hgb A1c MFr Bld  Date/Time Value Ref Range Status  08/04/2021 09:05 AM 5.7 4.6 - 6.5 % Final    Comment:    Glycemic Control Guidelines for People with Diabetes:Non Diabetic:  <6%Goal of Therapy: <7%Additional Action Suggested:  >8%   10/15/2020 09:22 AM 5.2 <5.7 % of total Hgb Final     Comment:    For the purpose of screening for the presence of diabetes: . <5.7%       Consistent with the absence of diabetes 5.7-6.4%    Consistent with increased risk for diabetes             (prediabetes) > or =6.5%  Consistent with diabetes . This assay result is consistent with a decreased risk of diabetes. . Currently, no consensus exists regarding use of hemoglobin A1c for diagnosis of diabetes in children. . According to American Diabetes Association (ADA) guidelines, hemoglobin A1c <7.0% represents optimal control in non-pregnant diabetic patients. Different metrics may apply to specific patient populations.  Standards of Medical Care in Diabetes(ADA). .     CBG: No results for input(s): GLUCAP in the last 168 hours.  Critical care time: 30 min   The patient is critically ill with multiple organ systems failure and requires high complexity decision  making for assessment and support, frequent evaluation and titration of therapies, application of advanced monitoring technologies and extensive interpretation of multiple databases.    Rodman Pickle, M.D. [facility] Pulmonary/Critical Care Medicine 01/04/2022 8:27 AM   Please see Amion for pager number to reach on-call Pulmonary and Critical Care Team.

## 2022-01-04 NOTE — Progress Notes (Signed)
Progress Note  1 Day Post-Op  Subjective: Pt moaning and yelling out for help. Not able to answer orientation questions this AM. When I asked about pain she denies but reported abdominal pain to RN earlier. She appears to have had some gas from stoma but no stool output. Family coming later this AM.   Objective: Vital signs in last 24 hours: Temp:  [97.8 F (36.6 C)-99 F (37.2 C)] 98.3 F (36.8 C) (01/31 0800) Pulse Rate:  [82-103] 93 (01/31 0600) Resp:  [12-20] 14 (01/31 0600) BP: (82-143)/(30-108) 123/43 (01/31 0600) SpO2:  [89 %-100 %] 93 % (01/31 0600) Weight:  [118.2 kg] 118.2 kg (01/31 0500) Last BM Date: 01/03/22  Intake/Output from previous day: 01/30 0701 - 01/31 0700 In: 4292.9 [I.V.:3702.9; IV Piggyback:579.9] Out: 0459 [Urine:1000; Stool:5; Blood:250] Intake/Output this shift: No intake/output data recorded.  PE: General: WD, obese female who is laying in bed and moaning  Heart: regular, rate, and rhythm.  Normal s1,s2. No obvious murmurs, gallops, or rubs noted.  Palpable radial and pedal pulses bilaterally Lungs: CTAB, no wheezes, rhonchi, or rales noted.  Respiratory effort nonlabored Abd: soft, appropriately ttp, moderately distended, stoma beefy red with gas in ostomy bag, honeycomb dressing to midline with gauze and penrose at inferior aspect, minimal SS drainage, abdominal binder present, NGT with thin bilious drainage  Psych: disoriented and agitated   Lab Results:  Recent Labs    01/03/22 0007 01/04/22 0245  WBC 11.7* 14.1*  HGB 11.1* 10.7*  HCT 33.0* 32.4*  PLT 305 309   BMET Recent Labs    01/02/22 1634 01/03/22 0007 01/04/22 0245  NA 132*  --  139  K 4.4  --  4.2  CL 101  --  111  CO2 21*  --  20*  GLUCOSE 131*  --  159*  BUN 51*  --  36*  CREATININE 2.37* 1.79* 1.02*  CALCIUM 7.7*  --  7.4*   PT/INR No results for input(s): LABPROT, INR in the last 72 hours. CMP     Component Value Date/Time   NA 139 01/04/2022 0245   NA  142 06/17/2019 1109   K 4.2 01/04/2022 0245   CL 111 01/04/2022 0245   CO2 20 (L) 01/04/2022 0245   GLUCOSE 159 (H) 01/04/2022 0245   GLUCOSE 88 11/08/2006 1015   BUN 36 (H) 01/04/2022 0245   BUN 22 06/17/2019 1109   CREATININE 1.02 (H) 01/04/2022 0245   CREATININE 0.70 10/15/2020 0922   CALCIUM 7.4 (L) 01/04/2022 0245   PROT 5.2 (L) 01/04/2022 0245   PROT 6.4 06/17/2019 1109   ALBUMIN 2.4 (L) 01/04/2022 0245   ALBUMIN 4.5 06/17/2019 1109   AST 31 01/04/2022 0245   ALT 21 01/04/2022 0245   ALKPHOS 86 01/04/2022 0245   BILITOT 1.0 01/04/2022 0245   BILITOT 0.3 06/17/2019 1109   GFRNONAA 57 (L) 01/04/2022 0245   GFRNONAA 85 10/15/2020 0922   GFRAA 98 10/15/2020 0922   Lipase  No results found for: LIPASE     Studies/Results: CT ABDOMEN PELVIS WO CONTRAST  Result Date: 01/02/2022 CLINICAL DATA:  Diarrhea and abdominal pain. EXAM: CT ABDOMEN AND PELVIS WITHOUT CONTRAST TECHNIQUE: Multidetector CT imaging of the abdomen and pelvis was performed following the standard protocol without IV contrast. RADIATION DOSE REDUCTION: This exam was performed according to the departmental dose-optimization program which includes automated exposure control, adjustment of the mA and/or kV according to patient size and/or use of iterative reconstruction technique. COMPARISON:  CT  abdomen and pelvis 01/08/2008. MRI abdomen 10/21/2009. FINDINGS: Lower chest: There is atelectasis in the lung bases. Hepatobiliary: Hypodense lesions in the left lobe of the liver are again seen and have increased in size with the largest now measuring 2.3 cm, characterized as a cyst. Hypodense lesion in the dome of the liver measures 3.6 x 1.4 cm measuring similar to prior MRI in 2010. This is previously characterized as atypical hemangioma. Gallstones and sludge are present. No biliary ductal dilatation. Pancreas: Unremarkable. No pancreatic ductal dilatation or surrounding inflammatory changes. Spleen: Normal in size without  focal abnormality. Adrenals/Urinary Tract: Adrenal glands are unremarkable. Kidneys are normal, without renal calculi, focal lesion, or hydronephrosis. Bladder is unremarkable. Stomach/Bowel: There is wall thickening and inflammation of the splenic flexure, descending colon and proximal sigmoid colon compatible with colitis. There is a small amount of free air throughout the abdomen and pelvis compatible with bowel perforation. Exact site of perforation is not visualized. There is no bowel obstruction. The appendix is not seen. Small bowel and stomach appear within normal limits. No pneumatosis. No portal venous gas identified. Vascular/Lymphatic: Aortic atherosclerosis. No enlarged abdominal or pelvic lymph nodes. Reproductive: Status post hysterectomy. No adnexal masses. Other: There is a small amount of free air in the abdomen and pelvis. There is wide-mouth umbilical hernia. No abscess identified. Musculoskeletal: There is some subcutaneous scarring in the anterior thighs bilaterally. L3, L4, L5 posterior fusion hardware appears uncomplicated. Bilateral hip arthroplasties are present. No acute fractures are seen. IMPRESSION: 1. Free air compatible with bowel perforation. Exact site of bowel perforation is not visualized, but is likely from the colon. There are nonspecific colitis findings involving the splenic flexure to the level of the sigmoid colon. No abscess. 2. Cholelithiasis and gallbladder sludge. 3.  Aortic Atherosclerosis (ICD10-I70.0). These results were called by telephone at the time of interpretation on 01/02/2022 at 7:21 pm to provider DAVID YAO , who verbally acknowledged these results. Electronically Signed   By: Ronney Asters M.D.   On: 01/02/2022 19:22   DG Abd 1 View  Result Date: 01/03/2022 CLINICAL DATA:  Abdominal pain and distention, history of perforated bowel EXAM: ABDOMEN - 1 VIEW COMPARISON:  CT abdomen pelvis, 01/02/2022, abdominal radiographs 12/30/2021 FINDINGS: Gas-filled,  although not overtly distended small bowel and colon throughout the abdomen and pelvis. Previously described small volume pneumoperitoneum seen by CT is not well appreciated by supine radiograph. Status post lower lumbar fusion and bilateral hip arthroplasty. IMPRESSION: Gas-filled, although not overtly distended small bowel and colon throughout the abdomen and pelvis. Previously described small volume pneumoperitoneum seen by CT is not well appreciated by supine radiograph. Electronically Signed   By: Delanna Ahmadi M.D.   On: 01/03/2022 10:22    Anti-infectives: Anti-infectives (From admission, onward)    Start     Dose/Rate Route Frequency Ordered Stop   01/03/22 0400  piperacillin-tazobactam (ZOSYN) IVPB 3.375 g        3.375 g 12.5 mL/hr over 240 Minutes Intravenous Every 8 hours 01/02/22 2337     01/02/22 1930  piperacillin-tazobactam (ZOSYN) IVPB 3.375 g        3.375 g 100 mL/hr over 30 Minutes Intravenous  Once 01/02/22 1922 01/02/22 2134        Assessment/Plan Bowel perforation and septic shock S/P exploratory laparotomy with left colectomy and transverse colostomy 01/03/22 Dr. Thermon Leyland Post-op delirium - POD1 - patient delirious this AM, seems somewhat pain related - continue IV tylenol and schedule IV robaxin, changed dilaudid to IV fentanyl -  ostomy seems to be productive of gas somewhat but no stool yet, WOC following  - change inferior dressing at least BID but prn for saturation  - WBC 14, would continue IV abx at least 5 days post-op, purulent material was noted intraoperatively  - off pressors this AM - NGT with minimal thin bilious output - continue NGT until patient more alert, then may be able to clamp and trial ice chips/sips - mobilize as able - would continue foley today with acute delirium   FEN: NPO, IVF, NGT to LIWS VTE: SQH, SCDs ID: Zosyn 1/29>> Foley: present   Recent UTI OSA AKI - Cr 1.02, improving  LOS: 2 days   I reviewed last 24 h vitals and  pain scores, last 48 h intake and output, last 24 h labs and trends, and CCM notes .  This care required low level of medical decision making.    Norm Parcel, [facility] 01/04/2022, 8:56 AM Please see Amion for pager number during day hours 7:00am-4:30pm

## 2022-01-04 NOTE — Progress Notes (Signed)
PCCM Progress Note  Called to bedside for delirum. Start seroquel 50 mg tonight. Qtc currently <563ms. EKG to check Qtc in am. May need IV haldol overnight.

## 2022-01-04 NOTE — Progress Notes (Signed)
[facility] Progress Note Patient Name: Terri Rodriguez DOB: 24-Aug-1946 MRN: [id number]   Date of Service  01/04/2022  HPI/Events of Note  Nursing request to change Seroquel route from PO to per tube.   eICU Interventions  Will change Seroquel to per tube.      Intervention Category Major Interventions: Other:  Lysle Dingwall 01/04/2022, 10:06 PM

## 2022-01-04 NOTE — Consult Note (Signed)
[place] Nurse ostomy consult note: POD 1 Patient with left transverse colostomy, approximately 2 and 1/2 inches in diameter, pink, round, raised, moist, edematous. Lumen in center.  Met with husband Richardson Landry, and he will be engaged in Mining engineer of ostomy as will daughter, Judson Roch.  Ostomy teaching folders (2) taken to room for family engagement.  Discussed with [facility] PA-C, K. Wynetta Emery.  Patient's first ostomy pouch change can be tomorrow or Thursday. Honeycomb dressing in place. Binder in place. Pouch with sweat, only.  [place] nursing team will follow, and will remain available to this patient, the nursing and medical teams.   Thanks, Maudie Flakes, MSN, RN, [place], Arther Abbott  Pager# [phone]

## 2022-01-04 NOTE — Progress Notes (Signed)
OT Cancellation Note  Patient Details Name: Terri Rodriguez MRN: [id number] DOB: [birth year]   Cancelled Treatment:    Reason Eval/Treat Not Completed: Fatigue/lethargy limiting ability to participate. Patient received Haldol this morning groggy at attempt for eval. Will check back 2/1 as schedule permits.   Delbert Phenix OT OT pager: [place] 01/04/2022, 1:11 PM

## 2022-01-04 NOTE — Op Note (Signed)
Patient: Terri Rodriguez ([birth year], [id number])  Date of Surgery: 01/03/2022   Preoperative Diagnosis: Perforated viscus  Postoperative Diagnosis: Perforated splenic flexure of colon due to ischemia or diverticular disease  Surgical Procedure:  Left colectomy with colostomy Mobilization of the splenic flexure  Operative Team Members:  Surgeon(s) and Role:    * Vito Beg, Nickola Major, MD - Primary  Barkley Boards, PA  Anesthesiologist: Barnet Glasgow, MD CRNA: Cleda Daub, CRNA; Renato Shin, CRNA   Anesthesia: General   Fluids: Crystalloid and albumin  Complications: None  Drains:  Penrose drain in the subcutaneous space exiting the inferior aspect of the wound    Specimen:  ID Type Source Tests Collected by Time Destination  1 : LEFT COLON **STITCH IS PROXIMAL** GI PATH GI Other SURGICAL PATHOLOGY Gemini Beaumier, Nickola Major, MD 01/03/2022 1512      Disposition:  PACU - hemodynamically stable.  Plan of Care: Continue inpatient care in ICU    Indications for Procedure: Terri Rodriguez is a 76 y.o. female who presented with abdominal pain.  CT demonstrated small amount of pneumoperitoneum.  Conservative management was attempted, however her pain worsened so decision was made to proceed with exploratory laparotomy with possible colectomy and possible colostomy.  The procedure itself as well as its risks, benefits and alternatives were discussed.  The risks discussed included but were not limited to the risk of infection, bleeding, damage to nearby structures, and need for additional surgery.  After a full discussion and all questions answered the patient granted consent to proceed.  Findings: Inflamed splenic flexure of the colon with evidence of small perforation   Description of Procedure:   On the date stated above the patient was taken the operating room suite and placed in supine position.  General endotracheal anesthesia was induced.  A timeout was completed verifying  the correct patient, procedure, position, and equipment needed for the case.  Patient's abdomen was prepped and draped in usual sterile fashion.  I began by making a midline laparotomy incision.  We dissected down into the abdominal cavity without trauma the underlying viscera.  The abdomen was inspected.  There was purulent peritonitis encountered.  We explored the intestines.  The stomach was soft, the pyloric channel was soft, there is no evidence of peptic ulcer perforation.  The liver felt uniform.  A lesion was palpated in the right lobe of the liver near the falciform ligament, this was unable to be visualized from her incision, but it felt consistent with a benign cyst as described in the CT report.  The colon and small intestines were inspected.  The cecum appeared normal.  The transverse colon appeared normal.  There was inflammation and purulence coming from the splenic flexure of the colon which was riding very high up near the spleen.  The descending colon appeared normal with some diverticula.  The sigmoid colon appeared normal with some diverticula.  The rectum appeared normal.  The small bowel was run from the ligament of Treitz to the terminal ileum.  It appeared normal other than some proximal jejunum which appeared secondarily inflamed from the sitting near the splenic flexure of the colon.  Decision was made to proceed with mobilization of the splenic flexure and likely left colectomy as this appeared to be the source of her sepsis and purulent peritonitis.  We worked to lift the omentum off the distal transverse colon and free the colon from the gastrocolic attachments.  We mobilized the descending colon out of the  retroperitoneum by dividing the white line of Toldt.  We worked both these dissections up towards the splenic flexure and divided the attachments to the spleen.  We completed this dissection without any trauma to the spleen, there was good hemostasis, and the left colon was able to  be medialized.  I did end up 1 layer to deep during part of this dissection, working into the retroperitoneum, but was able to come back up into the proper layer and fully mobilized the left colon.  The splenic flexure was now able to be inspected and appeared to have a perforation secondary to ischemia or diverticulitis.  Decision was made to proceed with resection with colostomy.  The left ureter was identified and protected.  A window was made in the mesocolon at the rectosigmoid junction.  This was identified where the tinea splay into the rectal muscle fibers.  A GIA 75 mm stapler was used to divide the rectosigmoid junction.  The mesocolon was divided using LigaSure bipolar energy.  The distal transverse colon was divided just distally to the middle colic vascular bundle where it appeared well perfused.  This was done with a GIA 75 mm stapler.  She had a very redundant and slightly dilated transverse colon.  The transverse colon mesentery was divided with the LigaSure.  The specimen was passed off the field and a suture was placed to mark the proximal stapled margin of the specimen.  There was some oozing along the rectal stump staple line.  This was controlled and the staple line was oversewn using Vicryl suture.  A 0 Prolene suture was tacked to the staple line and left with long tails in the pelvis for identification during future colostomy takedown.  There was good hemostasis.  The abdomen was irrigated to rinse out the purulent peritonitis.  We direct our attention to closure and creation of the colostomy.  The patient been marked by the wound ostomy nurses in the preoperative area.  The left upper quadrant marking was used.  I created a circular incision in the skin and dissected out a core of subcutaneous fatty tissue.  I made a cruciate incision in the anterior fascia of the rectus muscle.  I split the rectus muscle fibers along their length.  I made an incision in the posterior rectus sheath and the  peritoneum.  I dilated the defect in the abdominal wall to 3 fingerbreadths.  The stapled end of the transverse colon was brought through this defect to be matured into colostomy at the end of the case.  The midline fascia was closed using running 2-0 PDS suture.  A Penrose drain was placed in the deep subcutaneous tissues, and exits the inferior aspect of the incision.  The deep dermal layer was reapproximated using Vicryl suture.  Staples were used to close the skin.  A sterile dressing was applied.  Four 3-0 Vicryl Brooks style sutures were placed to begin maturing the colostomy.  Then multiple simple interrupted 3-0 Vicryl sutures were placed to approximate the mucocutaneous junction.  An ostomy appliance was applied.  All sponge and needle counts are correct at the end of this case.  At the end of the case we reviewed the infection status of the case. Patient: Terri Rodriguez Emergency General Surgery Service Patient Case: Emergent Infection Present At Time Of Surgery (PATOS): Purulent Peritonitis  Louanna Raw, MD General, Bariatric, & Minimally Invasive Surgery [facility] Surgery, [place]

## 2022-01-04 NOTE — TOC CM/SW Note (Signed)
°  Transition of Care [facility]) Screening Note   Patient Details  Name: CHANE MAGNER Date of Birth: 1946-06-30   Transition of Care [facility]) CM/SW Contact:    Ross Ludwig, LCSW Phone Number: 01/04/2022, 5:20 PM    Transition of Care Department [facility]) has reviewed patient and no TOC needs have been identified at this time. We will continue to monitor patient advancement through interdisciplinary progression rounds. If new patient transition needs arise, please place a TOC consult.

## 2022-01-05 DIAGNOSIS — K5792 Diverticulitis of intestine, part unspecified, without perforation or abscess without bleeding: Secondary | ICD-10-CM

## 2022-01-05 DIAGNOSIS — R5381 Other malaise: Secondary | ICD-10-CM | POA: Diagnosis present

## 2022-01-05 DIAGNOSIS — F05 Delirium due to known physiological condition: Secondary | ICD-10-CM

## 2022-01-05 DIAGNOSIS — R571 Hypovolemic shock: Secondary | ICD-10-CM

## 2022-01-05 DIAGNOSIS — R030 Elevated blood-pressure reading, without diagnosis of hypertension: Secondary | ICD-10-CM | POA: Diagnosis not present

## 2022-01-05 DIAGNOSIS — K631 Perforation of intestine (nontraumatic): Secondary | ICD-10-CM | POA: Diagnosis not present

## 2022-01-05 DIAGNOSIS — A419 Sepsis, unspecified organism: Secondary | ICD-10-CM

## 2022-01-05 DIAGNOSIS — K589 Irritable bowel syndrome without diarrhea: Secondary | ICD-10-CM

## 2022-01-05 LAB — BASIC METABOLIC PANEL
Anion gap: 8 (ref 5–15)
BUN: 27 mg/dL — ABNORMAL HIGH (ref 8–23)
CO2: 22 mmol/L (ref 22–32)
Calcium: 7.8 mg/dL — ABNORMAL LOW (ref 8.9–10.3)
Chloride: 112 mmol/L — ABNORMAL HIGH (ref 98–111)
Creatinine, Ser: 0.74 mg/dL (ref 0.44–1.00)
GFR, Estimated: >60 mL/min (ref >60)
Glucose, Bld: 130 mg/dL — ABNORMAL HIGH (ref 70–99)
Potassium: 3.7 mmol/L (ref 3.5–5.1)
Sodium: 142 mmol/L (ref 135–145)

## 2022-01-05 LAB — CBC
HCT: 31.2 % — ABNORMAL LOW (ref 36.0–46.0)
Hemoglobin: 10 g/dL — ABNORMAL LOW (ref 12.0–15.0)
MCH: 30.2 pg (ref 26.0–34.0)
MCHC: 32.1 g/dL (ref 30.0–36.0)
MCV: 94.3 fL (ref 80.0–100.0)
Platelets: 272 10*3/uL (ref 150–400)
RBC: 3.31 MIL/uL — ABNORMAL LOW (ref 3.87–5.11)
RDW: 14.5 % (ref 11.5–15.5)
WBC: 13.3 10*3/uL — ABNORMAL HIGH (ref 4.0–10.5)
nRBC: 0 % (ref 0.0–0.2)

## 2022-01-05 LAB — PHOSPHORUS: Phosphorus: 1.7 mg/dL — ABNORMAL LOW (ref 2.5–4.6)

## 2022-01-05 LAB — SURGICAL PATHOLOGY

## 2022-01-05 LAB — MAGNESIUM: Magnesium: 2.7 mg/dL — ABNORMAL HIGH (ref 1.7–2.4)

## 2022-01-05 MED ORDER — LABETALOL HCL 5 MG/ML IV SOLN
10.0000 mg | INTRAVENOUS | Status: DC | PRN
  Administered 2022-01-05 (×2): 10 mg via INTRAVENOUS
  Filled 2022-01-05 (×2): qty 4

## 2022-01-05 MED ORDER — POTASSIUM PHOSPHATES 15 MMOLE/5ML IV SOLN
30.0000 mmol | Freq: Once | INTRAVENOUS | Status: AC
  Administered 2022-01-05: 30 mmol via INTRAVENOUS
  Filled 2022-01-05: qty 10

## 2022-01-05 MED ORDER — HALOPERIDOL LACTATE 5 MG/ML IJ SOLN
5.0000 mg | Freq: Four times a day (QID) | INTRAMUSCULAR | Status: DC | PRN
  Administered 2022-01-05 – 2022-01-06 (×3): 5 mg via INTRAVENOUS
  Filled 2022-01-05 (×3): qty 1

## 2022-01-05 MED ORDER — HYDRALAZINE HCL 20 MG/ML IJ SOLN: 10.0000 mg | INTRAMUSCULAR | Status: DC | PRN

## 2022-01-05 NOTE — Assessment & Plan Note (Addendum)
-   continue CPAP qhs 

## 2022-01-05 NOTE — Assessment & Plan Note (Signed)
Benefits from CPAP with continued good compliance and control. Plan-continue auto 4-18

## 2022-01-05 NOTE — Consult Note (Signed)
[place] Nurse ostomy consult note Discussed with daughter Judson Roch that plan is to change pouch tomorrow.  We agree that the focus tomorrow is stomal and peristomal assessment, not teaching, so no family is required to be in attendance. Future sessions will be planned when patient is more able to participate. Educational folders (x2) in room.     [place] nursing team will continue to follow, and will remain available to this patient, the nursing and medical teams.   Thanks, Maudie Flakes, MSN, RN, [place], Arther Abbott  Pager# [phone]

## 2022-01-05 NOTE — Assessment & Plan Note (Addendum)
-   presented with hypotension and difficulty with bowel movements at home and having failed laxative trial - CT abdomen/pelvis showed perforated viscus.  Ex lap performed on 01/03/2022 showed perforated splenic flexure diverticulitis; treated with resection and colostomy placement -Completed postop antibiotics x 5 days. -Surgery removed penrose prior to discharge, recommended change inferior dressing at least twice daily but as needed for saturation. -Remove staples on 2/13 -Currently stable, receiving ostomy teaching per wound nurse -[place]

## 2022-01-05 NOTE — Evaluation (Signed)
Occupational Therapy Evaluation Patient Details Name: Terri Rodriguez MRN: [id number] DOB: December 05, 1946 Today's Date: 01/05/2022   History of Present Illness Patient admitted for bowel perforation and septic shock  Patient now S/P exploratory laparotomy with left colectomy and transverse colostomy 01/03/22 Dr. Thermon Leyland and now presents with Post-op delirium.   Clinical Impression   Terri Rodriguez is a 76 year old woman who presents with generalized weakness, decreased activity tolerance, impaired balance, pain and altered mental status resulting in a sudden decline in functional abilities. Patient's daughter reports she is typically independent with cane and ADLs but has needed increased assistance the last three weeks. On evaluation she requires max-total assistance for all ADLs at bed level and required mod assist x 2 to transfer to side of bed and stand. Patient's confusion significantly impairing her ability function - demonstrating difficulty following commands even with mulitmodal cues. Patient will benefit from skilled OT services while in hospital to improve deficits and learn compensatory strategies as needed in order to return to PLOF.  Recommend short term rehab at discharge.       Recommendations for follow up therapy are one component of a multi-disciplinary discharge planning process, led by the attending physician.  Recommendations may be updated based on patient status, additional functional criteria and insurance authorization.   Follow Up Recommendations  Skilled nursing-short term rehab (<3 hours/day)    Assistance Recommended at Discharge Frequent or constant Supervision/Assistance  Patient can return home with the following A lot of help with bathing/dressing/bathroom;Assistance with cooking/housework;Help with stairs or ramp for entrance;Direct supervision/assist for medications management;Two people to help with walking and/or transfers    Functional Status Assessment   Patient has had a recent decline in their functional status and demonstrates the ability to make significant improvements in function in a reasonable and predictable amount of time.  Equipment Recommendations  None recommended by OT    Recommendations for Other Services       Precautions / Restrictions Precautions Precautions: Fall Precaution Comments: confusion Restrictions Weight Bearing Restrictions: No      Mobility Bed Mobility                    Transfers                          Balance Overall balance assessment: Needs assistance Sitting-balance support: No upper extremity supported, Feet supported Sitting balance-Leahy Scale: Fair     Standing balance support: Reliant on assistive device for balance Standing balance-Leahy Scale: Poor                             ADL either performed or assessed with clinical judgement   ADL Overall ADL's : Needs assistance/impaired Eating/Feeding: NPO   Grooming: Wash/dry face;Maximal assistance;Bed level   Upper Body Bathing: Maximal assistance;Bed level   Lower Body Bathing: Total assistance;Bed level   Upper Body Dressing : Maximal assistance;Bed level   Lower Body Dressing: Total assistance;Bed level       Toileting- Clothing Manipulation and Hygiene: Total assistance;Bed level       Functional mobility during ADLs: +2 for physical assistance;+2 for safety/equipment General ADL Comments: Mod x 2 to transfer to side of bed, min-mod assist with hand held assist x 2 to stand at sidde of bed 3 times - transitioned to walker in standing and patient able to take 2-3 steps toward head of between the  last two stands.     Vision   Vision Assessment?: No apparent visual deficits     Perception     Praxis      Pertinent Vitals/Pain Pain Assessment Pain Assessment: Faces Faces Pain Scale: Hurts even more Pain Descriptors / Indicators: Operative site guarding, Grimacing, Guarding,  Moaning Pain Intervention(s): Limited activity within patient's tolerance     Hand Dominance Right   Extremity/Trunk Assessment Upper Extremity Assessment Upper Extremity Assessment: Generalized weakness;Difficult to assess due to impaired cognition   Lower Extremity Assessment Lower Extremity Assessment: Defer to PT evaluation   Cervical / Trunk Assessment Cervical / Trunk Assessment: Normal   Communication Communication Communication: No difficulties   Cognition Arousal/Alertness: Awake/alert Behavior During Therapy: WFL for tasks assessed/performed Overall Cognitive Status: Impaired/Different from baseline Area of Impairment: Orientation, Attention, Memory, Following commands, Safety/judgement, Awareness, Problem solving                 Orientation Level: Person Current Attention Level: Sustained   Following Commands: Follows one step commands inconsistently Safety/Judgement: Decreased awareness of deficits, Decreased awareness of safety Awareness: Intellectual Problem Solving: Slow processing, Decreased initiation, Difficulty sequencing, Requires verbal cues, Requires tactile cues       General Comments       Exercises     Shoulder Instructions      Home Living Family/patient expects to be discharged to:: Private residence Living Arrangements: Spouse/significant other Available Help at Discharge: Family;Available 24 hours/day Type of Home: House Home Access: Stairs to enter [employer] of Steps: 1   Home Layout: Two level;Bed/bath upstairs Alternate Level Stairs-Number of Steps: 16 Alternate Level Stairs-Rails: Right Bathroom Shower/Tub: Hospital doctor Toilet: Handicapped height     Home Equipment: Conservation officer, nature (2 wheels);Cane - single point          Prior Functioning/Environment Prior Level of Function : Independent/Modified Independent;Driving;Needs assist       Physical Assist : Mobility (physical) Mobility  (physical): Transfers;Gait   Mobility Comments: used a cane some.          OT Problem List: Decreased strength;Decreased activity tolerance;Impaired balance (sitting and/or standing);Decreased cognition;Decreased safety awareness;Decreased knowledge of use of DME or AE;Obesity;Pain      OT Treatment/Interventions: Self-care/ADL training;Therapeutic exercise;DME and/or AE instruction;Balance training;Therapeutic activities;Patient/family education    OT Goals(Current goals can be found in the care plan section) Acute Rehab OT Goals OT Goal Formulation: Patient unable to participate in goal setting Time For Goal Achievement: 01/19/22 Potential to Achieve Goals: Good  OT Frequency: Min 2X/week    Co-evaluation              AM-PAC OT "6 Clicks" Daily Activity     Outcome Measure Help from another person eating meals?: Total (NPO) Help from another person taking care of personal grooming?: A Lot Help from another person toileting, which includes using toliet, bedpan, or urinal?: Total Help from another person bathing (including washing, rinsing, drying)?: A Lot Help from another person to put on and taking off regular upper body clothing?: A Lot Help from another person to put on and taking off regular lower body clothing?: Total 6 Click Score: 9   End of Session Equipment Utilized During Treatment: Rolling walker (2 wheels) Nurse Communication: Mobility status  Activity Tolerance: Patient limited by pain Patient left:    OT Visit Diagnosis: Other symptoms and signs involving cognitive function;Pain                Time: 4097-3532 OT  Time Calculation (min): 32 min Charges:  OT General Charges $OT Visit: 1 Visit OT Evaluation $OT Eval Low Complexity: 1 Low  Johnross Nabozny, OTR/L [place]  Office [phone] Pager: [place] 01/05/2022, 1:19 PM

## 2022-01-05 NOTE — Progress Notes (Addendum)
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 She had been attempting to have a bowel cleansing at home with a trial of MiraLAX starting on 12/28/2021 with little success.  She ultimately presented on 01/02/2022 with ongoing difficulty having bowel movement and ensuing hypotension. CT abdomen/pelvis performed revealed free air concerning for underlying bowel perforation with site on visualized but presumed colonic.  She was evaluated by general surgery and underwent urgent ex lap which showed perforated splenic flexure diverticulitis and she was treated with colon resection and placement of colostomy.  Surgery performed on 01/03/2022.  Interval History:  Some ongoing delirium and restlessness; daughter bedside this morning and also her husband later in the early afternoon on repeat rounds.  Patient comfortable, pain seems controlled. NGT in place in initial rounds and removed on recheck. She was resting quietly when seen again in the afternoon as well. I discussed we'd continue frequent nursing rounding and redirecting patient as needed; if she does get significantly agitated then haldol would be preferred treatment but hopefully will not require much of this.  Trial of CLD after NGT removal today; ostomy noted with soft brown stool filling almost half of bag.    Assessment and Plan: * Bowel perforation ([place])- (present on admission) - Patient presented with hypotension and difficulty with bowel movements at home and having failed laxative trial; CT abdomen/pelvis showed perforated viscus.  Ex lap performed on 01/03/2022 showed perforated splenic flexure diverticulitis;  treated with resection and colostomy placement - ostomy bag now noted with stool this am, 01/05/22; denies nausea - NGT has been in place; patient still delirious/restless this am also; discussed with surgery, will remove NGT at this time and start CLD - continue monitoring ostomy output; will eventually need ostomy education once more awake/alert/oriented  Diverticulitis - see perforated colon as well - s/p ostomy now in place - continue zosyn for 5 days total per surgery given OR findings   Elevated blood pressure reading - treat pain as needed - using labetalol or hydralazine PRN also  Delirium due to multiple etiologies, acute, hyperactive - patient has known history of delirium in the past after surgery. Suspect this is multifactorial in setting of acute infection s/p perforated colon with sedation/anesthesia as well as pain component and also some ICU delirium - for now, attempt redirecting as much as possible; if/when transfers to progressive care and if still ongoing, then will consider sitter at that time if no improvement  - haldol ordered PRN in case of refractory agitation but would not administer for her restlessness - continue treating pain as necessary   Physical deconditioning - Some expected deconditioning in setting of infection, abdominal surgery, and anticipated prolonged hospitalization/recovery -PT/OT working with patient - Tentative plan will likely be for short-term rehab  IBS (irritable bowel syndrome) - likely constipation predominant; did not tolerate/respond to miralax trial prior to admission - continue monitoring ostomy output and will need ongoing bowel regimen at discharge   Obstructive sleep apnea- (present on admission) - continue nightly CPAP  Normocytic anemia - baseline approx 12g/dL - some downtrend not unexpected in setting of recent surgery and possibly some hemodilution;  no overt bleeding appreciated - continue trending Hgb daily   Sepsis  (HCC)-resolved as of 01/05/2022 - leukocytosis, tachycardia, source considered bowel perforation - continue abx - s/p surgical repair with ex lap and resection with colostomy placement on 01/03/22 - complete 5 days abx as per surgery   Hypovolemic shock (HCC)-resolved as of 01/05/2022 - presented with syncope and hypovolemia; started on Levophed in ICU which was weaned off on 01/03/22    Old records reviewed in assessment of this patient  Antimicrobials: Zosyn  1/29 >> current  DVT prophylaxis: HSQ  Code Status:   Code Status: Full Code  Disposition Plan:  Tentative to short term rehab in 3-4 days Status is: Inpt  Objective: Blood pressure (!) 164/99, pulse 95, temperature 98.3 F (36.8 C), temperature source Oral, resp. rate (!) 22, weight 119.4 kg, SpO2 94 %.  Examination:  Physical Exam Vitals reviewed.  Constitutional:      Comments: Pleasant elderly woman resting in bed appearing restless but in no distress with observed confusion  HENT:     Head: Normocephalic and atraumatic.     Mouth/Throat:     Mouth: Mucous membranes are dry.  Eyes:     Extraocular Movements: Extraocular movements intact.  Cardiovascular:     Rate and Rhythm: Normal rate and regular rhythm.  Pulmonary:     Effort: Pulmonary effort is normal.     Breath sounds: Normal breath sounds.  Abdominal:     Palpations: Abdomen is soft.     Comments: Ostomy bag in place filled almost halfway with soft, brown stool. BS present throughout, slightly hypoactive. TTP is appropriate  Musculoskeletal:        General: Normal range of motion.     Cervical back: Normal range of motion and neck supple.  Skin:    General: Skin is dry.  Neurological:     General: No focal deficit present.     Mental Status: She is disoriented.     Consultants:  General surgery  Procedures:  Ex lap 01/03/22  Data Reviewed: I have Reviewed nursing notes, Vitals, and Lab results since pt's last encounter. Pertinent lab results  creat 0.74, phos 1.7, WBC 13.3, Hgb 10 g/dL   LOS: 3 days   Dwyane Dee, MD [facility] 01/05/2022, 3:35 PM

## 2022-01-05 NOTE — Assessment & Plan Note (Addendum)
-   leukocytosis, tachycardia, source considered bowel perforation - continue abx - s/p surgical repair with ex lap and resection with colostomy placement on 01/03/22 - completed 5 days abx

## 2022-01-05 NOTE — Evaluation (Signed)
Physical Therapy Evaluation Patient Details Name: Terri Rodriguez MRN: [id number] DOB: October 23, 1946 Today's Date: 01/05/2022  History of Present Illness  Patient admitted for bowel perforation and septic shock  Patient now S/P exploratory laparotomy with left colectomy and transverse colostomy 01/03/22 Dr. Thermon Leyland and now presents with Post-op delirium.  Clinical Impression  The patient  resting in bed. Requiring much stimulation to initially arouse. Patient able to mobilize to sitting with max assistance and stand with 2 max assist, then mod assist and  able to take a few side steps when holding onto Rw.  Patient independent PTA. Pt admitted with above diagnosis.  Pt currently with functional limitations due to the deficits listed below (see PT Problem List). Pt will benefit from skilled PT to increase their independence and safety with mobility to allow discharge to the venue listed below.           Recommendations for follow up therapy are one component of a multi-disciplinary discharge planning process, led by the attending physician.  Recommendations may be updated based on patient status, additional functional criteria and insurance authorization.  Follow Up Recommendations Skilled nursing-short term rehab (<3 hours/day)    Assistance Recommended at Discharge Frequent or constant Supervision/Assistance  Patient can return home with the following  Two people to help with walking and/or transfers;Assistance with cooking/housework;A lot of help with bathing/dressing/bathroom;Help with stairs or ramp for entrance    Equipment Recommendations None recommended by PT  Recommendations for Other Services       Functional Status Assessment Patient has had a recent decline in their functional status and demonstrates the ability to make significant improvements in function in a reasonable and predictable amount of time.     Precautions / Restrictions Precautions Precautions: Fall Precaution  Comments: confusion, Coolstomy-gets full so check it. Restrictions Weight Bearing Restrictions: No      Mobility  Bed Mobility Overal bed mobility: Needs Assistance Bed Mobility: Supine to Sit, Sit to Sidelying     Supine to sit: Mod assist, +2 for safety/equipment, +2 for physical assistance   Sit to sidelying: Max assist, +2 for physical assistance, +2 for safety/equipment General bed mobility comments: placed in bed/ chair positon. then assisted to move to left bed edge. patient moving legs with assistnace. Total assist to return to sidelying then  to supine.    Transfers Overall transfer level: Needs assistance Equipment used: Rolling walker (2 wheels), 2 person hand held assist Transfers: Sit to/from Stand Sit to Stand: Max assist, +2 physical assistance, +2 safety/equipment, Mod assist           General transfer comment: initially max to stnad. patient stood x 3 times. Able to stnad and take small side steps after providing RW.    Ambulation/Gait                  Stairs            Wheelchair Mobility    Modified Rankin (Stroke Patients Only)       Balance Overall balance assessment: Needs assistance Sitting-balance support: No upper extremity supported, Feet supported Sitting balance-Leahy Scale: Fair     Standing balance support: Reliant on assistive device for balance, No upper extremity supported, Bilateral upper extremity supported Standing balance-Leahy Scale: Poor                               Pertinent Vitals/Pain Pain Assessment Faces Pain Scale: Hurts even more  Pain Location: back side Pain Descriptors / Indicators: Operative site guarding, Grimacing, Guarding, Moaning Pain Intervention(s): Monitored during session, Premedicated before session, Limited activity within patient's tolerance    Home Living Family/patient expects to be discharged to:: Private residence Living Arrangements: Spouse/significant  other Available Help at Discharge: Family;Available 24 hours/day Type of Home: House Home Access: Stairs to enter   Entrance Stairs-Number of Steps: 1 Alternate Level Stairs-Number of Steps: 16 Home Layout: Two level;Bed/bath upstairs Home Equipment: Rolling Walker (2 wheels);Cane - single point      Prior Function Prior Level of Function : Independent/Modified Independent;Driving;Needs assist       Physical Assist : Mobility (physical) Mobility (physical): Transfers;Gait   Mobility Comments: used a cane some.       Hand Dominance   Dominant Hand: Right    Extremity/Trunk Assessment   Upper Extremity Assessment Upper Extremity Assessment: Defer to OT evaluation    Lower Extremity Assessment Lower Extremity Assessment: Generalized weakness    Cervical / Trunk Assessment Cervical / Trunk Assessment: Normal  Communication   Communication:  (speech slurred, does nopt finish  words)  Cognition Arousal/Alertness: Lethargic Behavior During Therapy: Flat affect Overall Cognitive Status: Impaired/Different from baseline Area of Impairment: Orientation, Attention, Memory, Following commands, Safety/judgement, Awareness, Problem solving                 Orientation Level: Disoriented to, Time, Situation Current Attention Level: Focused   Following Commands: Follows one step commands inconsistently Safety/Judgement: Decreased awareness of deficits, Decreased awareness of safety Awareness: Intellectual Problem Solving: Slow processing, Decreased initiation, Difficulty sequencing, Requires verbal cues, Requires tactile cues General Comments: frequnet stimulation to stay aroused, cues for safety        General Comments      Exercises     Assessment/Plan    PT Assessment Patient needs continued PT services  PT Problem List Decreased strength;Decreased balance;Decreased cognition;Decreased knowledge of precautions;Decreased mobility;Decreased activity  tolerance;Decreased safety awareness       PT Treatment Interventions DME instruction;Therapeutic activities;Cognitive remediation;Gait training;Therapeutic exercise;Patient/family education;Functional mobility training;Balance training    PT Goals (Current goals can be found in the Care Plan section)  Acute Rehab PT Goals Patient Stated Goal: none stated PT Goal Formulation: With patient/family Time For Goal Achievement: 01/19/22 Potential to Achieve Goals: Fair    Frequency Min 2X/week     Co-evaluation PT/OT/SLP Co-Evaluation/Treatment: Yes Reason for Co-Treatment: To address functional/ADL transfers PT goals addressed during session: Mobility/safety with mobility OT goals addressed during session: ADL's and self-care       AM-PAC PT "6 Clicks" Mobility  Outcome Measure Help needed turning from your back to your side while in a flat bed without using bedrails?: A Lot Help needed moving from lying on your back to sitting on the side of a flat bed without using bedrails?: A Lot Help needed moving to and from a bed to a chair (including a wheelchair)?: Total Help needed standing up from a chair using your arms (e.g., wheelchair or bedside chair)?: Total Help needed to walk in hospital room?: Total Help needed climbing 3-5 steps with a railing? : Total 6 Click Score: 8    End of Session   Activity Tolerance: Patient tolerated treatment well Patient left: in bed;with call bell/phone within reach;with bed alarm set;with family/visitor present Nurse Communication: Mobility status PT Visit Diagnosis: Unsteadiness on feet (R26.81);Difficulty in walking, not elsewhere classified (R26.2)    Time: 1105-1130 PT Time Calculation (min) (ACUTE ONLY): 25 min   Charges:  PT Evaluation $PT Eval Low Complexity: 1 Low          Tresa Endo PT Acute Rehabilitation Services Pager [phone] Office [phone] \  Claretha Cooper 01/05/2022, 3:05 PM

## 2022-01-05 NOTE — Assessment & Plan Note (Signed)
-   presented with syncope and hypovolemia; started on Levophed in ICU which was weaned off on 01/03/22

## 2022-01-05 NOTE — Hospital Course (Signed)
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 She had been attempting to have a bowel cleansing at home with a trial of MiraLAX starting on 12/28/2021 with little success.  She ultimately presented on 01/02/2022 with ongoing difficulty having bowel movement and ensuing hypotension. CT abdomen/pelvis performed revealed free air concerning for underlying bowel perforation with site on visualized but presumed colonic.  She was evaluated by general surgery and underwent urgent ex lap which showed perforated splenic flexure diverticulitis and she was treated with colon resection and placement of colostomy.  Surgery performed on 01/03/2022.

## 2022-01-05 NOTE — Consult Note (Signed)
[place] Nurse ostomy follow up Patient with pouch intact. Supine position with Sequential compression devices intact bilaterally.  I will add Prevalon Boots today and continue to stand by. No family in room.  I will connect with them later today.  [facility] nursing team will follow along with you, and will remain available to this patient, the nursing and medical teams.    Thanks, Maudie Flakes, MSN, RN, [place], Arther Abbott  Pager# [phone]

## 2022-01-05 NOTE — Progress Notes (Signed)
Pt unable to do cpap, due to large NG tube.

## 2022-01-05 NOTE — Assessment & Plan Note (Addendum)
-   PT OT evaluation initially recommended SNF.  However patient now wants to go home  -Patient's family will be able to provide 24/7 supervision and feel comfortable with her going home.

## 2022-01-05 NOTE — Progress Notes (Signed)
Patient has been very restless/agitated this AM. PRN haldol and fentanyl ineffective as patient remains restless, attempting to get OOB and moaning. Dr Sabino Gasser notified by this RN; orders received for increased PRN haldol dose. This RN will continue to reorient patient as able and assess for agitation/anxiety.

## 2022-01-05 NOTE — Progress Notes (Signed)
Pharmacy Antibiotic Note  Terri Rodriguez is a 76 y.o. female admitted on 01/02/2022 with  syncopal episode.  She has been having several weeks of constipation.  Pharmacy has been consulted to dose zosyn for intra-abdominal infection.   Now s/p colectomy/colostomy   Plan: Continue Zosyn 3.375g IV Q8H infused over 4hr through 2/4 per surgery  Will sign off and follow remotely  Weight: 119.4 kg (263 lb 3.7 oz)  Temp (24hrs), Avg:98.5 F (36.9 C), Min:98 F (36.7 C), Max:99.1 F (37.3 C)  Recent Labs  Lab 01/02/22 1634 01/03/22 0007 01/04/22 0245 01/05/22 0219  WBC 12.3* 11.7* 14.1* 13.3*  CREATININE 2.37* 1.79* 1.02* 0.74  LATICACIDVEN  --  1.4  --   --      Estimated Creatinine Clearance: 77.1 mL/min (by C-G formula based on SCr of 0.74 mg/dL).    Allergies  Allergen Reactions   Codeine     nausea   Crab [Shellfish Allergy] Itching and Nausea And Vomiting      Thank you for allowing pharmacy to be a part of this patients care.  Ulice Dash, PharmD  01/05/2022, 1:45 PM

## 2022-01-05 NOTE — Assessment & Plan Note (Addendum)
-   baseline approx 12g/dL -Mild downtrend likely due to recent surgery, no overt bleeding -H&H stable, no acute issues

## 2022-01-05 NOTE — Assessment & Plan Note (Addendum)
-   patient has known history of delirium in the past after surgery. Suspect this is multifactorial in setting of acute infection s/p perforated colon with sedation/anesthesia as well as pain component and also some ICU delirium -Mentation has actually continued to improve over the past couple days.  She is now on progressive unit and remains awake, alert, and cooperative. - continue treating pain as necessary

## 2022-01-05 NOTE — Assessment & Plan Note (Addendum)
-   see perforated colon as well - s/p ostomy now in place -Completed 5-day course of Zosyn on 01/07/2022

## 2022-01-05 NOTE — Assessment & Plan Note (Signed)
She understands the value of normal body weight.  Continuing efforts at diet/exercise.

## 2022-01-05 NOTE — Progress Notes (Signed)
Progress Note  2 Days Post-Op  Subjective: Still some confusion this AM.  Cooperative with my exam.  In mittens  Objective: Vital signs in last 24 hours: Temp:  [98 F (36.7 C)-99.1 F (37.3 C)] 98 F (36.7 C) (02/01 0800) Pulse Rate:  [80-109] 80 (02/01 0838) Resp:  [13-22] 13 (02/01 0838) BP: (118-168)/(36-101) 168/69 (02/01 0800) SpO2:  [89 %-100 %] 100 % (02/01 0838) Weight:  [119.4 kg] 119.4 kg (02/01 0400) Last BM Date: 01/05/22  Intake/Output from previous day: 01/31 0701 - 02/01 0700 In: 663.5 [IV Piggyback:663.5] Out: 1920 [Urine:1750; Emesis/NG output:170] Intake/Output this shift: Total I/O In: 1.1 [IV Piggyback:1.1] Out: 355 [Urine:325; Emesis/NG output:30]  PE:  Abd: ostomy healthy.  Incision c/d/I w/ dressing and penrose at inferior aspect with some drainage on dressing   Lab Results:  Recent Labs    01/04/22 0245 01/05/22 0219  WBC 14.1* 13.3*  HGB 10.7* 10.0*  HCT 32.4* 31.2*  PLT 309 272    BMET Recent Labs    01/04/22 0245 01/05/22 0219  NA 139 142  K 4.2 3.7  CL 111 112*  CO2 20* 22  GLUCOSE 159* 130*  BUN 36* 27*  CREATININE 1.02* 0.74  CALCIUM 7.4* 7.8*    PT/INR No results for input(s): LABPROT, INR in the last 72 hours. CMP     Component Value Date/Time   NA 142 01/05/2022 0219   NA 142 06/17/2019 1109   K 3.7 01/05/2022 0219   CL 112 (H) 01/05/2022 0219   CO2 22 01/05/2022 0219   GLUCOSE 130 (H) 01/05/2022 0219   GLUCOSE 88 11/08/2006 1015   BUN 27 (H) 01/05/2022 0219   BUN 22 06/17/2019 1109   CREATININE 0.74 01/05/2022 0219   CREATININE 0.70 10/15/2020 0922   CALCIUM 7.8 (L) 01/05/2022 0219   PROT 5.2 (L) 01/04/2022 0245   PROT 6.4 06/17/2019 1109   ALBUMIN 2.4 (L) 01/04/2022 0245   ALBUMIN 4.5 06/17/2019 1109   AST 31 01/04/2022 0245   ALT 21 01/04/2022 0245   ALKPHOS 86 01/04/2022 0245   BILITOT 1.0 01/04/2022 0245   BILITOT 0.3 06/17/2019 1109   GFRNONAA >60 01/05/2022 0219   GFRNONAA 85 10/15/2020  0922   GFRAA 98 10/15/2020 0922   Lipase  No results found for: LIPASE     Studies/Results: DG Abd 1 View  Result Date: 01/03/2022 CLINICAL DATA:  Abdominal pain and distention, history of perforated bowel EXAM: ABDOMEN - 1 VIEW COMPARISON:  CT abdomen pelvis, 01/02/2022, abdominal radiographs 12/30/2021 FINDINGS: Gas-filled, although not overtly distended small bowel and colon throughout the abdomen and pelvis. Previously described small volume pneumoperitoneum seen by CT is not well appreciated by supine radiograph. Status post lower lumbar fusion and bilateral hip arthroplasty. IMPRESSION: Gas-filled, although not overtly distended small bowel and colon throughout the abdomen and pelvis. Previously described small volume pneumoperitoneum seen by CT is not well appreciated by supine radiograph. Electronically Signed   By: Delanna Ahmadi M.D.   On: 01/03/2022 10:22    Anti-infectives: Anti-infectives (From admission, onward)    Start     Dose/Rate Route Frequency Ordered Stop   01/03/22 0400  piperacillin-tazobactam (ZOSYN) IVPB 3.375 g        3.375 g 12.5 mL/hr over 240 Minutes Intravenous Every 8 hours 01/02/22 2337     01/02/22 1930  piperacillin-tazobactam (ZOSYN) IVPB 3.375 g        3.375 g 100 mL/hr over 30 Minutes Intravenous  Once 01/02/22 1922 01/02/22  2134        Assessment/Plan Bowel perforation and septic shock S/P exploratory laparotomy with left colectomy and transverse colostomy 01/03/22 Dr. Thermon Leyland Post-op delirium - POD2 - multimodal pain control - Awaiting return of bowel function - WOC care appreciated - 5 days zosyn till WBC better to treat intra-op contamination - change inferior dressing at least BID but prn for saturation  - mobilize as able - okay to remove foley  FEN: NPO, IVF, NGT to LIWS VTE: SQH, SCDs ID: Zosyn 1/29>>01/08/22 Foley: okay to remove  Recent UTI OSA AKI - Cr 0.74, resolved  LOS: 3 days   I reviewed last 24 h vitals and  pain scores, last 48 h intake and output, last 24 h labs and trends, and CCM notes .  This care required low level of medical decision making.    Terri Rodriguez, [facility] 01/05/2022, 9:11 AM Please see Amion for pager number during day hours 7:00am-4:30pm

## 2022-01-05 NOTE — Assessment & Plan Note (Addendum)
-   likely constipation predominant; did not tolerate/respond to miralax trial prior to admission - continue monitoring ostomy output, will need bowel regimen at discharge

## 2022-01-05 NOTE — Assessment & Plan Note (Signed)
-   treat pain as needed - using labetalol or hydralazine PRN also

## 2022-01-06 DIAGNOSIS — K5792 Diverticulitis of intestine, part unspecified, without perforation or abscess without bleeding: Secondary | ICD-10-CM | POA: Diagnosis not present

## 2022-01-06 DIAGNOSIS — F05 Delirium due to known physiological condition: Secondary | ICD-10-CM | POA: Diagnosis not present

## 2022-01-06 DIAGNOSIS — A419 Sepsis, unspecified organism: Principal | ICD-10-CM

## 2022-01-06 DIAGNOSIS — K631 Perforation of intestine (nontraumatic): Secondary | ICD-10-CM | POA: Diagnosis not present

## 2022-01-06 DIAGNOSIS — R5381 Other malaise: Secondary | ICD-10-CM | POA: Diagnosis not present

## 2022-01-06 LAB — PHOSPHORUS: Phosphorus: 2.8 mg/dL (ref 2.5–4.6)

## 2022-01-06 LAB — CBC
HCT: 30.1 % — ABNORMAL LOW (ref 36.0–46.0)
Hemoglobin: 9.9 g/dL — ABNORMAL LOW (ref 12.0–15.0)
MCH: 30.8 pg (ref 26.0–34.0)
MCHC: 32.9 g/dL (ref 30.0–36.0)
MCV: 93.8 fL (ref 80.0–100.0)
Platelets: 243 10*3/uL (ref 150–400)
RBC: 3.21 MIL/uL — ABNORMAL LOW (ref 3.87–5.11)
RDW: 14.5 % (ref 11.5–15.5)
WBC: 9.8 10*3/uL (ref 4.0–10.5)
nRBC: 0 % (ref 0.0–0.2)

## 2022-01-06 LAB — BASIC METABOLIC PANEL
Anion gap: 5 (ref 5–15)
BUN: 15 mg/dL (ref 8–23)
CO2: 26 mmol/L (ref 22–32)
Calcium: 7.9 mg/dL — ABNORMAL LOW (ref 8.9–10.3)
Chloride: 110 mmol/L (ref 98–111)
Creatinine, Ser: 0.6 mg/dL (ref 0.44–1.00)
GFR, Estimated: >60 mL/min (ref >60)
Glucose, Bld: 126 mg/dL — ABNORMAL HIGH (ref 70–99)
Potassium: 3.8 mmol/L (ref 3.5–5.1)
Sodium: 141 mmol/L (ref 135–145)

## 2022-01-06 LAB — MAGNESIUM: Magnesium: 2.2 mg/dL (ref 1.7–2.4)

## 2022-01-06 MED ORDER — KETOROLAC TROMETHAMINE 15 MG/ML IJ SOLN: 15.0000 mg | Freq: Four times a day (QID) | INTRAMUSCULAR | Status: AC | PRN

## 2022-01-06 MED ORDER — OXYCODONE HCL 5 MG PO TABS
2.5000 mg | ORAL_TABLET | ORAL | Status: DC | PRN
  Administered 2022-01-08: 5 mg via ORAL
  Filled 2022-01-06: qty 1

## 2022-01-06 MED ORDER — ACETAMINOPHEN 325 MG PO TABS
650.0000 mg | ORAL_TABLET | Freq: Four times a day (QID) | ORAL | Status: DC
  Administered 2022-01-06 – 2022-01-10 (×15): 650 mg via ORAL
  Filled 2022-01-06 (×16): qty 2

## 2022-01-06 MED ORDER — FENTANYL CITRATE (PF) 100 MCG/2ML IJ SOLN: 25.0000 ug | INTRAMUSCULAR | Status: DC | PRN

## 2022-01-06 MED ORDER — LIP MEDEX EX OINT
TOPICAL_OINTMENT | CUTANEOUS | Status: DC | PRN
  Filled 2022-01-06: qty 7

## 2022-01-06 MED ORDER — ENSURE ENLIVE PO LIQD
237.0000 mL | Freq: Three times a day (TID) | ORAL | Status: DC
  Administered 2022-01-06 – 2022-01-07 (×4): 237 mL via ORAL

## 2022-01-06 MED ORDER — METHOCARBAMOL 500 MG PO TABS
500.0000 mg | ORAL_TABLET | Freq: Four times a day (QID) | ORAL | Status: DC
  Administered 2022-01-06 – 2022-01-09 (×14): 500 mg via ORAL
  Filled 2022-01-06 (×15): qty 1

## 2022-01-06 NOTE — Progress Notes (Signed)
NUTRITION NOTE  Patient is POD #3 ex lap, L colectomy, and transverse colostomy 2/2 perforation.  Patient now on FLD. Patient sleeping and no visitors present at bedside. RN requests oral nutrition supplement for between meals.  Will order Ensure Enlive/Plus High Protein TID, each supplement provides 350 kcal and 20 grams of protein.      Jarome Matin, MS, RD, LDN Inpatient Clinical Dietitian RD pager # available in [place]  After hours/weekend pager # available in [facility]

## 2022-01-06 NOTE — TOC Initial Note (Signed)
Transition of Care [facility]) - Initial/Assessment Note   Patient Details  Name: Terri Rodriguez MRN: [id number] Date of Birth: 04-03-1946  [facility]) CM/SW Contact:    [name], LCSW Phone Number: 01/06/2022, 2:19 PM  Clinical Narrative: PT and OT evaluations recommended SNF. CSW spoke with patient's husband, [name], to discuss recommendations as patient is lethargic. Husband is agreeable to SNF and reported the patient has not been to rehab before. Patient is vaccinated x4 for COVID.  Husband reported a family friend recommended [name] and this would be his first choice. CSW explained [name] often does not have bed availability as the facility takes its own residents first, so CSW recommended husband call [name] directly to see if a bed is available.  FL2 done; PASRR verified. Initial referral faxed out for review. TOC awaiting bed offers.  Expected Discharge Plan: Skilled Nursing Facility Barriers to Discharge: Continued Medical Work up  Patient Goals and CMS Choice Patient states their goals for this hospitalization and ongoing recovery are:: Go to rehab CMS Medicare.gov Compare Post Acute Care list provided to:: Patient Represenative (must comment) Choice offered to / list presented to : Spouse, Patient  Expected Discharge Plan and Services Expected Discharge Plan: [name] In-house Referral: Clinical Social Work Post Acute Care Choice: [name] Living arrangements for the past 2 months: [place]              DME Arranged: N/A DME Agency: NA  Prior Living Arrangements/Services Living arrangements for the past 2 months: Single Family Home Lives with:: Spouse Patient language and need for interpreter reviewed:: Yes Do you feel safe going back to the place where you live?: Yes      Need for Family Participation in Patient Care: Yes (Comment) Care giver support system in place?: Yes (comment) Criminal  Activity/Legal Involvement Pertinent to Current Situation/Hospitalization: No - Comment as needed  Permission Sought/Granted Permission sought to share information with : Facility Art therapist granted to share information with : Yes, Verbal Permission Granted Permission granted to share info w AGENCY: SNFs  Emotional Assessment Orientation: : Fluctuating Orientation (Suspected and/or reported Sundowners) Alcohol / Substance Use: Not Applicable Psych Involvement: No (comment)  Admission diagnosis:  Bowel perforation ([place]) [K63.1] Perforation bowel ([place]) [K63.1] Septic shock ([place]) [A41.9, R65.21] Patient Active Problem List   Diagnosis Date Noted   Delirium due to multiple etiologies, acute, hyperactive 01/05/2022   IBS (irritable bowel syndrome) 01/05/2022   Diverticulitis 01/05/2022   Elevated blood pressure reading 01/05/2022   Physical deconditioning 01/05/2022   Bowel perforation ([place]) 01/02/2022   Lumbar post-laminectomy syndrome 10/07/2021   Cervical spondylosis without myelopathy 10/07/2021   Primary osteoarthritis of both feet 06/18/2017   Status post bilateral total hip replacement 06/18/2017   Status post total knee replacement, bilateral 06/18/2017   Degenerative disc disease, lumbar status post fusion 06/18/2017   Degenerative disc disease, cervical 06/18/2017   Raynaud's disease without gangrene 06/18/2017   History of cholelithiasis 06/18/2017   Other chronic pain 02/01/2017   Visit for preventive health examination 01/21/2015   Medicare annual wellness visit, subsequent 01/21/2015   BMI 40.0-44.9, adult ([place]) 01/21/2015   Hyperlipidemia LDL goal <70 01/12/2015   Morbid obesity ([place]) 01/12/2015   Hyperglycemia 01/10/2014   Mild CAD 01/06/2014   Medication monitoring encounter 09/22/2012   Hx of cardiac catheterization    Postmenopausal HRT (hormone replacement therapy) 09/20/2012   Adenomatous polyp of colon 06/14/2012   ROSACEA 04/26/2010  Obstructive sleep apnea 09/09/2009   Normocytic anemia 12/15/2008   LEUKOPENIA, CHRONIC 01/08/2008   Elevated lipids 08/17/2007   ALLERGIC RHINITIS 08/17/2007   GERD 08/17/2007   Primary osteoarthritis of both hands 08/17/2007   PCP:  Burnis Medin, MD Pharmacy:   [facility] ([place]) [place], [place] [address] Phone: [phone] Fax: [phone]  [facility] - [place], [place] - [address] [place] [address] Phone: [phone] Fax: [phone]  Readmission Risk Interventions No flowsheet data found.

## 2022-01-06 NOTE — Progress Notes (Signed)
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 She had been attempting to have a bowel cleansing at home with a trial of MiraLAX starting on 12/28/2021 with little success.  She ultimately presented on 01/02/2022 with ongoing difficulty having bowel movement and ensuing hypotension. CT abdomen/pelvis performed revealed free air concerning for underlying bowel perforation with site on visualized but presumed colonic.  She was evaluated by general surgery and underwent urgent ex lap which showed perforated splenic flexure diverticulitis and she was treated with colon resection and placement of colostomy.  Surgery performed on 01/03/2022.  Assessment and Plan: * Bowel perforation ([place])- (present on admission) - Patient presented with hypotension and difficulty with bowel movements at home and having failed laxative trial; CT abdomen/pelvis showed perforated viscus.  Ex lap performed on 01/03/2022 showed perforated splenic flexure diverticulitis; treated with resection and colostomy placement - ostomy bag now noted with stool, 01/05/22; denies nausea - NGT removed 2/1; diet advanced further today to FLD per surgery - continue monitoring ostomy output; will eventually need ostomy education once more awake/alert/oriented  Diverticulitis - see perforated colon as well - s/p ostomy now in place - continue zosyn for 5 days total per surgery given OR findings   Elevated blood pressure reading - treat pain as needed - using labetalol or hydralazine PRN also  Delirium due to multiple etiologies, acute, hyperactive - patient has known history of delirium in the past after  surgery. Suspect this is multifactorial in setting of acute infection s/p perforated colon with sedation/anesthesia as well as pain component and also some ICU delirium - for now, attempt redirecting as much as possible; if/when transfers to progressive care and if still ongoing, then will consider sitter at that time if no improvement  - haldol ordered PRN in case of refractory agitation but would not administer for her restlessness - continue treating pain as necessary   Physical deconditioning - Some expected deconditioning in setting of infection, abdominal surgery, and anticipated prolonged hospitalization/recovery -PT/OT working with patient - Tentative plan will likely be for short-term rehab  IBS (irritable bowel syndrome) - likely constipation predominant; did not tolerate/respond to miralax trial prior to admission - continue monitoring ostomy output and will need ongoing bowel regimen at discharge   Obstructive sleep apnea- (present on admission) - continue nightly CPAP  Normocytic anemia - baseline approx 12g/dL - some downtrend not unexpected in setting of recent surgery and possibly some hemodilution; no overt bleeding appreciated - continue trending Hgb daily   Sepsis (HCC)-resolved as of 01/05/2022 - leukocytosis, tachycardia, source considered bowel perforation - continue abx - s/p surgical repair with ex lap and resection with colostomy placement on 01/03/22 - complete 5 days abx as per surgery   Hypovolemic shock (HCC)-resolved as of 01/05/2022 - presented with syncope and hypovolemia; started on Levophed in ICU which was weaned off on 01/03/22        Subjective: No events overnight.  Much more comfortable in bed this morning with less restlessness.  Still remains somewhat confused.  Physical Exam: Vitals:   01/06/22 0700 01/06/22 0800 01/06/22 0834 01/06/22 1240  BP: 138/65 (!) 159/71    Pulse: 82  80    Resp: 17 19    Temp:   98.1 F (36.7 C) (!) 97.5 F  (36.4 C)  TempSrc:   Axillary Oral  SpO2: 91% 95%    Weight:       Physical Exam Vitals reviewed.  Constitutional:      Comments: Pleasant elderly woman resting in bed appearing restless but in no distress with observed confusion  HENT:     Head: Normocephalic and atraumatic.     Mouth/Throat:     Mouth: Mucous membranes are dry.  Eyes:     Extraocular Movements: Extraocular movements intact.  Cardiovascular:     Rate and Rhythm: Normal rate and regular rhythm.  Pulmonary:     Effort: Pulmonary effort is normal.     Breath sounds: Normal breath sounds.  Abdominal:     Palpations: Abdomen is soft.     Comments: Ostomy bag in place empty this morning with stoma noted in bag but red and healthy-appearing.  Appropriately tender to palpation  Musculoskeletal:        General: Normal range of motion.     Cervical back: Normal range of motion and neck supple.  Skin:    General: Skin is dry.  Neurological:     General: No focal deficit present.     Mental Status: She is disoriented.     Data Reviewed:  I have Reviewed nursing notes, Vitals, and Lab results since pt's last encounter. Pertinent lab results Hgb 9.9, WBC 9.8 I have ordered test including CBC, Mg, BMP I have reviewed the last note from all staff,  I have discussed pt's care plan and test results with nursing staff.   Family Communication:   Disposition: Status is: Inpatient Remains inpatient appropriate because: Ongoing treatment for perforated bowel s/p resection and colostomy placement     Planned Discharge Destination: Skilled nursing facility      Author: Dwyane Dee, MD 01/06/2022 3:12 PM  For on call review [email or web address].

## 2022-01-06 NOTE — NC FL2 (Signed)
[place] LEVEL OF CARE SCREENING TOOL     IDENTIFICATION  Patient Name: Terri Rodriguez Birthdate: 03-Dec-1946 Sex: female Admission Date (Current Location): 01/02/2022  [place] and [place] Number:  Herbalist and Address:  [facility],  [place], [place]      Provider Number: [id number]  Attending Physician Name and Address:  Dwyane Dee, MD  Relative Name and Phone Number:  Kamrie Fanton (husband) Ph: [phone]    Current Level of Care: Hospital Recommended Level of Care: [place] Prior Approval Number:    Date Approved/Denied:   PASRR Number: [id number]  Discharge Plan: SNF    Current Diagnoses: Patient Active Problem List   Diagnosis Date Noted   Delirium due to multiple etiologies, acute, hyperactive 01/05/2022   IBS (irritable bowel syndrome) 01/05/2022   Diverticulitis 01/05/2022   Elevated blood pressure reading 01/05/2022   Physical deconditioning 01/05/2022   Bowel perforation ([place]) 01/02/2022   Lumbar post-laminectomy syndrome 10/07/2021   Cervical spondylosis without myelopathy 10/07/2021   Primary osteoarthritis of both feet 06/18/2017   Status post bilateral total hip replacement 06/18/2017   Status post total knee replacement, bilateral 06/18/2017   Degenerative disc disease, lumbar status post fusion 06/18/2017   Degenerative disc disease, cervical 06/18/2017   Raynaud's disease without gangrene 06/18/2017   History of cholelithiasis 06/18/2017   Other chronic pain 02/01/2017   Visit for preventive health examination 01/21/2015   Medicare annual wellness visit, subsequent 01/21/2015   BMI 40.0-44.9, adult ([place]) 01/21/2015   Hyperlipidemia LDL goal <70 01/12/2015   Morbid obesity ([place]) 01/12/2015   Hyperglycemia 01/10/2014   Mild CAD 01/06/2014   Medication monitoring encounter 09/22/2012   Hx of cardiac catheterization    Postmenopausal HRT (hormone replacement therapy)  09/20/2012   Adenomatous polyp of colon 06/14/2012   ROSACEA 04/26/2010   Obstructive sleep apnea 09/09/2009   Normocytic anemia 12/15/2008   LEUKOPENIA, CHRONIC 01/08/2008   Elevated lipids 08/17/2007   ALLERGIC RHINITIS 08/17/2007   GERD 08/17/2007   Primary osteoarthritis of both hands 08/17/2007    Orientation RESPIRATION BLADDER Height & Weight     Self, Place, Time  Normal Incontinent Weight: 260 lb 2.3 oz (118 kg) Height:     BEHAVIORAL SYMPTOMS/MOOD NEUROLOGICAL BOWEL NUTRITION STATUS   (N/A)  (N/A) Continent Diet (Full liquid (will be advanced prior to discharge))  AMBULATORY STATUS COMMUNICATION OF NEEDS Skin   Extensive Assist Verbally Surgical wounds, Other (Comment) (Ecchymosis: bilateral anus)                       Personal Care Assistance Level of Assistance  Bathing, Feeding, Dressing Bathing Assistance: Maximum assistance Feeding assistance: Limited assistance Dressing Assistance: Maximum assistance     Functional Limitations Info  Sight, Hearing, Speech Sight Info: Impaired Hearing Info: Adequate Speech Info: Adequate    SPECIAL CARE FACTORS FREQUENCY  PT (By licensed PT), OT (By licensed OT)     PT Frequency: 5x's/week OT Frequency: 5x's/week            Contractures Contractures Info: Not present    Additional Factors Info  Code Status, Allergies, Psychotropic Code Status Info: Full Allergies Info: Codeine, Crab (Shellfish Allergy) Psychotropic Info: Seroquel, Haldol         Current Medications (01/06/2022):  This is the current hospital active medication list Current Facility-Administered Medications  Medication Dose Route Frequency Provider Last Rate Last Admin   0.9 %  sodium chloride  infusion  250 mL Intravenous Continuous Norm Parcel, PA-C   Held at 01/02/22 2355   acetaminophen (TYLENOL) tablet 650 mg  650 mg Oral Q6H Norm Parcel, PA-C   650 mg at 01/06/22 1056   Chlorhexidine Gluconate Cloth 2 % PADS 6 each  6 each  Topical Daily Norm Parcel, PA-C   6 each at 01/05/22 0911   feeding supplement (ENSURE ENLIVE / ENSURE PLUS) liquid 237 mL  237 mL Oral TID BM Dwyane Dee, MD   237 mL at 01/06/22 1408   fentaNYL (SUBLIMAZE) injection 25-50 mcg  25-50 mcg Intravenous Q1H PRN Barkley Boards R, PA-C       haloperidol lactate (HALDOL) injection 5 mg  5 mg Intravenous Q6H PRN Dwyane Dee, MD   5 mg at 01/06/22 2800   heparin injection 5,000 Units  5,000 Units Subcutaneous Q8H Norm Parcel, PA-C   5,000 Units at 01/06/22 1407   hydrALAZINE (APRESOLINE) injection 10 mg  10 mg Intravenous Q4H PRN Dwyane Dee, MD       ipratropium-albuterol (DUONEB) 0.5-2.5 (3) MG/3ML nebulizer solution 3 mL  3 mL Nebulization Q6H PRN Norm Parcel, PA-C       ketorolac (TORADOL) 15 MG/ML injection 15 mg  15 mg Intravenous Q6H PRN Norm Parcel, PA-C       labetalol (NORMODYNE) injection 10 mg  10 mg Intravenous Q4H PRN Dwyane Dee, MD   10 mg at 01/05/22 2207   lip balm (CARMEX) ointment   Topical PRN Dwyane Dee, MD   Given at 01/06/22 1408   MEDLINE mouth rinse  15 mL Mouth Rinse BID Norm Parcel, PA-C   15 mL at 01/06/22 3491   methocarbamol (ROBAXIN) tablet 500 mg  500 mg Oral QID Barkley Boards R, PA-C   500 mg at 01/06/22 1413   ondansetron (ZOFRAN) injection 4 mg  4 mg Intravenous Q6H PRN Norm Parcel, PA-C   4 mg at 01/03/22 1040   ondansetron (ZOFRAN) injection 4 mg  4 mg Intravenous Q6H Barkley Boards R, PA-C   4 mg at 01/06/22 1241   oxyCODONE (Oxy IR/ROXICODONE) immediate release tablet 2.5-5 mg  2.5-5 mg Oral Q4H PRN Norm Parcel, PA-C       pantoprazole (PROTONIX) injection 40 mg  40 mg Intravenous QHS Norm Parcel, PA-C   40 mg at 01/05/22 2207   piperacillin-tazobactam (ZOSYN) IVPB 3.375 g  3.375 g Intravenous Q8H Norm Parcel, PA-C 12.5 mL/hr at 01/06/22 1400 Infusion Verify at 01/06/22 1400   QUEtiapine (SEROQUEL) tablet 50 mg  50 mg Per Tube QHS Anders Simmonds, MD          Discharge Medications: Please see discharge summary for a list of discharge medications.  Relevant Imaging Results:  Relevant Lab Results:   Additional Information SSN: [id number]  [name], LCSW

## 2022-01-06 NOTE — Progress Notes (Signed)
Progress Note  3 Days Post-Op  Subjective: Pt lethargic this AM but able to answer orientation questions regarding person, place and time. She fell asleep before answering situation. She is having stool output from stoma. PT/OT recommending SNF yesterday.   Objective: Vital signs in last 24 hours: Temp:  [98 F (36.7 C)-98.6 F (37 C)] 98.1 F (36.7 C) (02/02 0834) Pulse Rate:  [58-95] 83 (02/02 0635) Resp:  [14-24] 14 (02/02 0635) BP: (137-175)/(47-99) 137/47 (02/02 0405) SpO2:  [86 %-100 %] 93 % (02/02 0635) Weight:  [229 kg] 118 kg (02/02 0500) Last BM Date: 01/05/22  Intake/Output from previous day: 02/01 0701 - 02/02 0700 In: 585.9 [IV Piggyback:585.9] Out: 1855 [NLGXQ:1194; Emesis/NG output:80; Stool:400] Intake/Output this shift: Total I/O In: 123.8 [IV Piggyback:123.8] Out: 9 [Urine:350]  PE: General: WD, obese female who is laying in bed in NAD Heart: regular, rate, and rhythm.  Lungs: Respiratory effort nonlabored Abd: soft, appropriately ttp, mildly distended, stoma beefy red but edematous this AM and having stool output, midline wound with staples present, penrose drain in inferior incision with some dried SS appearing fluid Psych: oriented x3 this AM but lethargic   Lab Results:  Recent Labs    01/05/22 0219 01/06/22 0238  WBC 13.3* 9.8  HGB 10.0* 9.9*  HCT 31.2* 30.1*  PLT 272 243   BMET Recent Labs    01/05/22 0219 01/06/22 0238  NA 142 141  K 3.7 3.8  CL 112* 110  CO2 22 26  GLUCOSE 130* 126*  BUN 27* 15  CREATININE 0.74 0.60  CALCIUM 7.8* 7.9*   PT/INR No results for input(s): LABPROT, INR in the last 72 hours. CMP     Component Value Date/Time   NA 141 01/06/2022 0238   NA 142 06/17/2019 1109   K 3.8 01/06/2022 0238   CL 110 01/06/2022 0238   CO2 26 01/06/2022 0238   GLUCOSE 126 (H) 01/06/2022 0238   GLUCOSE 88 11/08/2006 1015   BUN 15 01/06/2022 0238   BUN 22 06/17/2019 1109   CREATININE 0.60 01/06/2022 0238    CREATININE 0.70 10/15/2020 0922   CALCIUM 7.9 (L) 01/06/2022 0238   PROT 5.2 (L) 01/04/2022 0245   PROT 6.4 06/17/2019 1109   ALBUMIN 2.4 (L) 01/04/2022 0245   ALBUMIN 4.5 06/17/2019 1109   AST 31 01/04/2022 0245   ALT 21 01/04/2022 0245   ALKPHOS 86 01/04/2022 0245   BILITOT 1.0 01/04/2022 0245   BILITOT 0.3 06/17/2019 1109   GFRNONAA >60 01/06/2022 0238   GFRNONAA 85 10/15/2020 0922   GFRAA 98 10/15/2020 0922   Lipase  No results found for: LIPASE     Studies/Results: No results found.  Anti-infectives: Anti-infectives (From admission, onward)    Start     Dose/Rate Route Frequency Ordered Stop   01/03/22 0400  piperacillin-tazobactam (ZOSYN) IVPB 3.375 g        3.375 g 12.5 mL/hr over 240 Minutes Intravenous Every 8 hours 01/02/22 2337     01/02/22 1930  piperacillin-tazobactam (ZOSYN) IVPB 3.375 g        3.375 g 100 mL/hr over 30 Minutes Intravenous  Once 01/02/22 1922 01/02/22 2134        Assessment/Plan Bowel perforation and septic shock - off pressors and HD stable, septic shock resolved s/p source control  POD3 S/P exploratory laparotomy with left colectomy and transverse colostomy 01/03/22 Dr. Thermon Leyland Post-op delirium - multimodal pain control, work on transitioning to PO pain meds today with emphasis on maximizing  non-narcotics - having stool output from stoma - advance to FLD - WOC care appreciated, stoma is edematous today, will continue to monitor  - WBC normalized today, continue IV abx 5 days post-op - change inferior dressing at least BID but prn for saturation  - continue to mobilize and make sure patient is turning frequently    FEN: FLD, SLIV VTE: SQH, SCDs ID: Zosyn 1/29>>01/08/22 Foley: removed 2/1, voiding    Recent UTI OSA AKI - Cr 0.6, resolved  LOS: 4 days   I reviewed nursing notes, Consultant WOC RN notes, hospitalist notes, last 24 h vitals and pain scores, last 48 h intake and output, last 24 h labs and trends, and therapy  notes .  This care required moderate level of medical decision making.    Norm Parcel, [facility] 01/06/2022, 9:08 AM Please see Amion for pager number during day hours 7:00am-4:30pm

## 2022-01-06 NOTE — Consult Note (Addendum)
[facility] Nurse ostomy consult note Stoma type/location: Left transverse colostomy. Assessed with [facility] PA-C. Sandria Senter who photographed the stoma and midline incision for the EHR. Stomal assessment/size: Large, edematous, pink, lumen in center.  Mild prolapse vs weight of large edematous stoma pulling it down slightly.  2 and 3/4 inch round, somewhat smaller at base. Peristomal assessment: intact.  Treatment options for stomal/peristomal skin: Two skin barrier rings needed to encircle stoma base. Output: brown stool in pouch, approximately 195mls Ostomy pouching: 2pc. , 4-inch pouching system with two skin barrier rings Education provided: None today. Patient is sleeping soundly, no family in room. Two education folders in room as active participants in care will be husband and daughter (along with patient).  Enrolled patient in [place] program: No   Anticipated discharge plan is short-term rehab.  Supplies in room:  three, 2 and 3/4 inch pouching systems with 6 skin barrier rings, pattern, stoma measuring guide. May need a few more 4-inch pouching systems, depending on the speed of stoma edema resolution in coming days/weeks. Three ordered via Unit Secretary and same communicated to Bedside RN, Minette Brine, so that they will be placed at bedside with other materials.  Pressure injury prevention modalities in place:  Prophylactic sacral foam, Prevalon Boots, pressure redistribution chair pad for time when OOB in chair, turning and repositioning per house protocol.  [facility] nursing team will follow, and will remain available to this patient, the nursing and medical teams.   Thanks, Maudie Flakes, MSN, RN, [place], Arther Abbott  Pager# [phone]

## 2022-01-07 ENCOUNTER — Encounter (HOSPITAL_COMMUNITY): Payer: Self-pay | Admitting: Pulmonary Disease

## 2022-01-07 DIAGNOSIS — F05 Delirium due to known physiological condition: Secondary | ICD-10-CM | POA: Diagnosis not present

## 2022-01-07 DIAGNOSIS — K5792 Diverticulitis of intestine, part unspecified, without perforation or abscess without bleeding: Secondary | ICD-10-CM | POA: Diagnosis not present

## 2022-01-07 DIAGNOSIS — K631 Perforation of intestine (nontraumatic): Secondary | ICD-10-CM | POA: Diagnosis not present

## 2022-01-07 DIAGNOSIS — R5381 Other malaise: Secondary | ICD-10-CM | POA: Diagnosis not present

## 2022-01-07 LAB — BASIC METABOLIC PANEL
Anion gap: 5 (ref 5–15)
BUN: 13 mg/dL (ref 8–23)
CO2: 25 mmol/L (ref 22–32)
Calcium: 8.2 mg/dL — ABNORMAL LOW (ref 8.9–10.3)
Chloride: 112 mmol/L — ABNORMAL HIGH (ref 98–111)
Creatinine, Ser: 0.62 mg/dL (ref 0.44–1.00)
GFR, Estimated: >60 mL/min (ref >60)
Glucose, Bld: 106 mg/dL — ABNORMAL HIGH (ref 70–99)
Potassium: 4.7 mmol/L (ref 3.5–5.1)
Sodium: 142 mmol/L (ref 135–145)

## 2022-01-07 LAB — CBC
HCT: 28.4 % — ABNORMAL LOW (ref 36.0–46.0)
Hemoglobin: 9.6 g/dL — ABNORMAL LOW (ref 12.0–15.0)
MCH: 30.6 pg (ref 26.0–34.0)
MCHC: 33.8 g/dL (ref 30.0–36.0)
MCV: 90.4 fL (ref 80.0–100.0)
Platelets: 215 10*3/uL (ref 150–400)
RBC: 3.14 MIL/uL — ABNORMAL LOW (ref 3.87–5.11)
RDW: 14.4 % (ref 11.5–15.5)
WBC: 7.5 10*3/uL (ref 4.0–10.5)
nRBC: 0 % (ref 0.0–0.2)

## 2022-01-07 LAB — MAGNESIUM: Magnesium: 2 mg/dL (ref 1.7–2.4)

## 2022-01-07 LAB — PHOSPHORUS: Phosphorus: 3 mg/dL (ref 2.5–4.6)

## 2022-01-07 NOTE — Progress Notes (Signed)
Physical Therapy Treatment Patient Details Name: Terri Rodriguez MRN: [id number] DOB: [birth year] Today's Date: 01/07/2022   History of Present Illness Patient admitted for bowel perforation and septic shock  Patient now S/P exploratory laparotomy with left colectomy and transverse colostomy 01/03/22  and now presents with Post-op delirium.    PT Comments    Good progress with mobility today. Mod assist for bed mobility, min assist for transfers. Pt ambulated 24' with RW, distance limited by fatigue. Cognition is improving.    Recommendations for follow up therapy are one component of a multi-disciplinary discharge planning process, led by the attending physician.  Recommendations may be updated based on patient status, additional functional criteria and insurance authorization.  Follow Up Recommendations  Skilled nursing-short term rehab (<3 hours/day)     Assistance Recommended at Discharge Frequent or constant Supervision/Assistance  Patient can return home with the following A little help with walking and/or transfers;A little help with bathing/dressing/bathroom;Assistance with cooking/housework;Direct supervision/assist for medications management;Assist for transportation;Help with stairs or ramp for entrance   Equipment Recommendations  None recommended by PT    Recommendations for Other Services       Precautions / Restrictions Precautions Precautions: Fall Precaution Comments: confusion, Coolstomy-gets full so check it, chronic LBP Restrictions Weight Bearing Restrictions: No     Mobility  Bed Mobility Overal bed mobility: Needs Assistance Bed Mobility: Rolling, Sidelying to Sit Rolling: Mod assist Sidelying to sit: Mod assist, HOB elevated       General bed mobility comments: assist to initiate roll and to raise trunk, HOB up    Transfers Overall transfer level: Needs assistance Equipment used: Rolling walker (2 wheels) Transfers: Sit to/from Stand Sit to Stand:  Mod assist           General transfer comment: assist to power up, VCs hand placement    Ambulation/Gait Ambulation/Gait assistance: Min guard Gait Distance (Feet): 24 Feet Assistive device: Rolling walker (2 wheels) Gait Pattern/deviations: Step-through pattern, Decreased stride length Gait velocity: decr     General Gait Details: distance limited by pain/fatigue, VCs for positioning in RW   Stairs             Wheelchair Mobility    Modified Rankin (Stroke Patients Only)       Balance Overall balance assessment: Needs assistance Sitting-balance support: No upper extremity supported, Feet supported Sitting balance-Leahy Scale: Fair     Standing balance support: Reliant on assistive device for balance, No upper extremity supported, Bilateral upper extremity supported Standing balance-Leahy Scale: Poor                              Cognition Arousal/Alertness: Awake/alert Behavior During Therapy: Flat affect Overall Cognitive Status: Impaired/Different from baseline Area of Impairment: Memory, Following commands                       Following Commands: Follows one step commands with increased time     Problem Solving: Slow processing, Decreased initiation, Requires verbal cues General Comments: VCs for safety        Exercises      General Comments        Pertinent Vitals/Pain Pain Assessment Pain Score: 5  Pain Location: abdominal incision Pain Descriptors / Indicators: Operative site guarding, Grimacing, Guarding, Moaning Pain Intervention(s): Limited activity within patient's tolerance, Monitored during session, Premedicated before session    Home Living  Prior Function            PT Goals (current goals can now be found in the care plan section) Acute Rehab PT Goals Patient Stated Goal: to walk PT Goal Formulation: With patient/family Time For Goal Achievement:  01/19/22 Potential to Achieve Goals: Fair Progress towards PT goals: Progressing toward goals    Frequency    Min 2X/week      PT Plan Current plan remains appropriate    Co-evaluation              AM-PAC PT "6 Clicks" Mobility   Outcome Measure  Help needed turning from your back to your side while in a flat bed without using bedrails?: A Lot Help needed moving from lying on your back to sitting on the side of a flat bed without using bedrails?: A Lot Help needed moving to and from a bed to a chair (including a wheelchair)?: A Lot Help needed standing up from a chair using your arms (e.g., wheelchair or bedside chair)?: A Lot Help needed to walk in hospital room?: A Little Help needed climbing 3-5 steps with a railing? : A Lot 6 Click Score: 13    End of Session Equipment Utilized During Treatment: Gait belt Activity Tolerance: Patient tolerated treatment well Patient left: in chair;with call bell/phone within reach;with family/visitor present;with chair alarm set Nurse Communication: Mobility status;Other (comment) (pt needs new purewick) PT Visit Diagnosis: Unsteadiness on feet (R26.81);Difficulty in walking, not elsewhere classified (R26.2)     Time: 5176-1607 PT Time Calculation (min) (ACUTE ONLY): 33 min  Charges:  $Gait Training: 8-22 mins $Therapeutic Activity: 8-22 mins                     Blondell Reveal Kistler PT 01/07/2022  Acute Rehabilitation Services Pager [phone] Office [phone]

## 2022-01-07 NOTE — Progress Notes (Signed)
Progress Note  4 Days Post-Op  Subjective: Pt sleeping this AM, when awakened she answered some questions for me but frequently fell back asleep. Reports she just got some pain medication. She is having stool output from stoma.   Objective: Vital signs in last 24 hours: Temp:  [97.5 F (36.4 C)-99.7 F (37.6 C)] 97.9 F (36.6 C) (02/03 0800) Pulse Rate:  [65-90] 69 (02/03 0700) Resp:  [14-22] 17 (02/03 0700) BP: (123-182)/(41-88) 159/58 (02/03 0700) SpO2:  [91 %-100 %] 97 % (02/03 0700) Weight:  [118.5 kg] 118.5 kg (02/03 0500) Last BM Date: 01/06/22  Intake/Output from previous day: 02/02 0701 - 02/03 0700 In: 247.6 [IV Piggyback:247.6] Out: 1350 [Urine:1100; Stool:250] Intake/Output this shift: No intake/output data recorded.  PE: General: WD, obese female who is laying in bed in NAD Heart: regular, rate, and rhythm.  Lungs: Respiratory effort nonlabored Abd: soft, appropriately ttp, mildly distended, stoma beefy red but edematous this AM and having stool output, midline wound with staples present, penrose drain in inferior incision with some dried SS appearing fluid Psych: lethargic but able to answer some questions for me    Lab Results:  Recent Labs    01/06/22 0238 01/07/22 0306  WBC 9.8 7.5  HGB 9.9* 9.6*  HCT 30.1* 28.4*  PLT 243 215   BMET Recent Labs    01/06/22 0238 01/07/22 0306  NA 141 142  K 3.8 4.7  CL 110 112*  CO2 26 25  GLUCOSE 126* 106*  BUN 15 13  CREATININE 0.60 0.62  CALCIUM 7.9* 8.2*   PT/INR No results for input(s): LABPROT, INR in the last 72 hours. CMP     Component Value Date/Time   NA 142 01/07/2022 0306   NA 142 06/17/2019 1109   K 4.7 01/07/2022 0306   CL 112 (H) 01/07/2022 0306   CO2 25 01/07/2022 0306   GLUCOSE 106 (H) 01/07/2022 0306   GLUCOSE 88 11/08/2006 1015   BUN 13 01/07/2022 0306   BUN 22 06/17/2019 1109   CREATININE 0.62 01/07/2022 0306   CREATININE 0.70 10/15/2020 0922   CALCIUM 8.2 (L) 01/07/2022  0306   PROT 5.2 (L) 01/04/2022 0245   PROT 6.4 06/17/2019 1109   ALBUMIN 2.4 (L) 01/04/2022 0245   ALBUMIN 4.5 06/17/2019 1109   AST 31 01/04/2022 0245   ALT 21 01/04/2022 0245   ALKPHOS 86 01/04/2022 0245   BILITOT 1.0 01/04/2022 0245   BILITOT 0.3 06/17/2019 1109   GFRNONAA >60 01/07/2022 0306   GFRNONAA 85 10/15/2020 0922   GFRAA 98 10/15/2020 0922   Lipase  No results found for: LIPASE     Studies/Results: No results found.  Anti-infectives: Anti-infectives (From admission, onward)    Start     Dose/Rate Route Frequency Ordered Stop   01/03/22 0400  piperacillin-tazobactam (ZOSYN) IVPB 3.375 g        3.375 g 12.5 mL/hr over 240 Minutes Intravenous Every 8 hours 01/02/22 2337 01/08/22 0359   01/02/22 1930  piperacillin-tazobactam (ZOSYN) IVPB 3.375 g        3.375 g 100 mL/hr over 30 Minutes Intravenous  Once 01/02/22 1922 01/02/22 2134        Assessment/Plan Bowel perforation and septic shock - off pressors and HD stable, septic shock resolved s/p source control  POD4 S/P exploratory laparotomy with left colectomy and transverse colostomy 01/03/22 Dr. Thermon Leyland Post-op delirium - multimodal pain control, maximize non-narcotic meds - having stool output from stoma - advance to SOFT - [place]  care appreciated, stoma is edematous today, will continue to monitor  - WBC normalized today, continue IV abx 5 days post-op - change inferior dressing at least BID but prn for saturation  - continue to mobilize and make sure patient is turning frequently  - ok to transition to floor from a surgical standpoint    FEN: SOFT diet, SLIV VTE: SQH, SCDs ID: Zosyn 1/29>>2/4 Foley: removed 2/1, voiding    Recent UTI OSA AKI - Cr 0.6, resolved  LOS: 5 days   I reviewed hospitalist notes, last 24 h vitals and pain scores, last 48 h intake and output, and last 24 h labs and trends.  This care required low level of medical decision making.    Norm Parcel, [facility] Surgery 01/07/2022, 8:51 AM Please see Amion for pager number during day hours 7:00am-4:30pm

## 2022-01-07 NOTE — Progress Notes (Signed)
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 She had been attempting to have a bowel cleansing at home with a trial of MiraLAX starting on 12/28/2021 with little success.  She ultimately presented on 01/02/2022 with ongoing difficulty having bowel movement and ensuing hypotension. CT abdomen/pelvis performed revealed free air concerning for underlying bowel perforation with site on visualized but presumed colonic.  She was evaluated by general surgery and underwent urgent ex lap which showed perforated splenic flexure diverticulitis and she was treated with colon resection and placement of colostomy.  Surgery performed on 01/03/2022.  Assessment and Plan: * Bowel perforation ([place])- (present on admission) - Patient presented with hypotension and difficulty with bowel movements at home and having failed laxative trial; CT abdomen/pelvis showed perforated viscus.  Ex lap performed on 01/03/2022 showed perforated splenic flexure diverticulitis; treated with resection and colostomy placement - ostomy bag now noted with stool, 01/05/22; denies nausea - NGT removed 2/1; diet advanced further to soft per surgery - continue monitoring ostomy output; will eventually need ostomy education once more awake/alert/oriented  Diverticulitis - see perforated colon as well - s/p ostomy now in place - continue zosyn for 5 days total per surgery given OR findings   Elevated blood pressure reading - treat pain as needed - using labetalol or hydralazine PRN also  Delirium due to multiple etiologies, acute, hyperactive - patient has known history of delirium in the past after  surgery. Suspect this is multifactorial in setting of acute infection s/p perforated colon with sedation/anesthesia as well as pain component and also some ICU delirium - for now, attempt redirecting as much as possible; if/when transfers to progressive care and if still ongoing, then will consider sitter at that time if no improvement  - haldol ordered PRN in case of refractory agitation but would not administer for her restlessness - continue treating pain as necessary   Physical deconditioning - Some expected deconditioning in setting of infection, abdominal surgery, and anticipated prolonged hospitalization/recovery -PT/OT working with patient - Tentative plan will likely be for short-term rehab  IBS (irritable bowel syndrome) - likely constipation predominant; did not tolerate/respond to miralax trial prior to admission - continue monitoring ostomy output and will need ongoing bowel regimen at discharge   Obstructive sleep apnea- (present on admission) - continue nightly CPAP  Normocytic anemia - baseline approx 12g/dL - some downtrend not unexpected in setting of recent surgery and possibly some hemodilution; no overt bleeding appreciated - continue trending Hgb daily   Sepsis (HCC)-resolved as of 01/05/2022 - leukocytosis, tachycardia, source considered bowel perforation - continue abx - s/p surgical repair with ex lap and resection with colostomy placement on 01/03/22 - complete 5 days abx as per surgery   Hypovolemic shock (HCC)-resolved as of 01/05/2022 - presented with syncope and hypovolemia; started on Levophed in ICU which was weaned off on 01/03/22     Subjective: No events overnight.  Remains comfortable but still confused. Sleepy this morning. Was unable to answer any orientation questions but was trying to think of the answers.  Will transfer out of ICU today and see if PT/OT can start to work some with patient as she  becomes more alert and awake.   Physical  Exam: Vitals:   01/07/22 0500 01/07/22 0600 01/07/22 0700 01/07/22 0800  BP: (!) 161/58 (!) 146/88 (!) 159/58 (!) 164/57  Pulse: 80 70 69 67  Resp: 17 16 17 19   Temp:    97.9 F (36.6 C)  TempSrc:    Oral  SpO2: 97% 92% 97% 93%  Weight: 118.5 kg      Physical Exam Vitals reviewed.  Constitutional:      Comments: Pleasant elderly woman resting in bed appearing restless but in no distress with observed confusion  HENT:     Head: Normocephalic and atraumatic.     Mouth/Throat:     Mouth: Mucous membranes are moist.  Eyes:     Extraocular Movements: Extraocular movements intact.  Cardiovascular:     Rate and Rhythm: Normal rate and regular rhythm.  Pulmonary:     Effort: Pulmonary effort is normal.     Breath sounds: Normal breath sounds.  Abdominal:     Palpations: Abdomen is soft.     Comments: Ostomy bag in place noted with brown stool. Stoma large but healthy appearing. BS present   Musculoskeletal:        General: Normal range of motion.     Cervical back: Normal range of motion and neck supple.  Skin:    General: Skin is dry.  Neurological:     General: No focal deficit present.     Mental Status: She is disoriented.     Data Reviewed:  I have Reviewed nursing notes, Vitals, and Lab results since pt's last encounter. Pertinent lab results Hgb 9.6, WBC 7.5 I have ordered test including CBC, Mg, BMP I have reviewed the last note from staff over past 24 hours,  I have discussed pt's care plan and test results with nursing staff.   Family Communication:   Disposition: Status is: Inpatient Remains inpatient appropriate because: Ongoing treatment for perforated bowel s/p resection and colostomy placement     Planned Discharge Destination: Skilled nursing facility DVT ppx: HSQ    Author: Dwyane Dee, MD 01/07/2022 2:31 PM  For on call review [email or web address].

## 2022-01-08 ENCOUNTER — Encounter (HOSPITAL_COMMUNITY): Payer: Self-pay | Admitting: Pulmonary Disease

## 2022-01-08 DIAGNOSIS — F05 Delirium due to known physiological condition: Secondary | ICD-10-CM | POA: Diagnosis not present

## 2022-01-08 DIAGNOSIS — R5381 Other malaise: Secondary | ICD-10-CM | POA: Diagnosis not present

## 2022-01-08 DIAGNOSIS — K631 Perforation of intestine (nontraumatic): Secondary | ICD-10-CM | POA: Diagnosis not present

## 2022-01-08 DIAGNOSIS — K5792 Diverticulitis of intestine, part unspecified, without perforation or abscess without bleeding: Secondary | ICD-10-CM | POA: Diagnosis not present

## 2022-01-08 LAB — CBC
HCT: 38.7 % (ref 36.0–46.0)
Hemoglobin: 12.5 g/dL (ref 12.0–15.0)
MCH: 30 pg (ref 26.0–34.0)
MCHC: 32.3 g/dL (ref 30.0–36.0)
MCV: 92.8 fL (ref 80.0–100.0)
Platelets: 274 10*3/uL (ref 150–400)
RBC: 4.17 MIL/uL (ref 3.87–5.11)
RDW: 14.3 % (ref 11.5–15.5)
WBC: 9.7 10*3/uL (ref 4.0–10.5)
nRBC: 0 % (ref 0.0–0.2)

## 2022-01-08 LAB — BASIC METABOLIC PANEL
Anion gap: 8 (ref 5–15)
BUN: 12 mg/dL (ref 8–23)
CO2: 27 mmol/L (ref 22–32)
Calcium: 8.3 mg/dL — ABNORMAL LOW (ref 8.9–10.3)
Chloride: 101 mmol/L (ref 98–111)
Creatinine, Ser: 0.69 mg/dL (ref 0.44–1.00)
GFR, Estimated: >60 mL/min (ref >60)
Glucose, Bld: 110 mg/dL — ABNORMAL HIGH (ref 70–99)
Potassium: 3.7 mmol/L (ref 3.5–5.1)
Sodium: 136 mmol/L (ref 135–145)

## 2022-01-08 LAB — CULTURE, BLOOD (ROUTINE X 2)
Culture: NO GROWTH
Culture: NO GROWTH
Special Requests: ADEQUATE
Special Requests: ADEQUATE

## 2022-01-08 LAB — PHOSPHORUS: Phosphorus: 3.4 mg/dL (ref 2.5–4.6)

## 2022-01-08 LAB — MAGNESIUM: Magnesium: 2 mg/dL (ref 1.7–2.4)

## 2022-01-08 MED ORDER — QUETIAPINE FUMARATE 50 MG PO TABS
50.0000 mg | ORAL_TABLET | Freq: Every day | ORAL | Status: DC
  Administered 2022-01-08: 50 mg via ORAL
  Filled 2022-01-08 (×2): qty 1

## 2022-01-08 MED ORDER — PANTOPRAZOLE SODIUM 40 MG PO TBEC
40.0000 mg | DELAYED_RELEASE_TABLET | Freq: Every day | ORAL | Status: DC
  Administered 2022-01-08 – 2022-01-12 (×3): 40 mg via ORAL
  Filled 2022-01-08 (×5): qty 1

## 2022-01-08 MED ORDER — OXYCODONE HCL 5 MG PO TABS
2.5000 mg | ORAL_TABLET | ORAL | Status: DC | PRN
  Administered 2022-01-08: 5 mg via ORAL
  Filled 2022-01-08: qty 1

## 2022-01-08 MED ORDER — MORPHINE SULFATE (PF) 2 MG/ML IV SOLN: 2.0000 mg | INTRAVENOUS | Status: DC | PRN

## 2022-01-08 MED ORDER — PROCHLORPERAZINE EDISYLATE 10 MG/2ML IJ SOLN
10.0000 mg | Freq: Four times a day (QID) | INTRAMUSCULAR | Status: DC | PRN
  Administered 2022-01-08: 10 mg via INTRAVENOUS
  Filled 2022-01-08: qty 2

## 2022-01-08 NOTE — Progress Notes (Signed)
5 Days Post-Op   Subjective/Chief Complaint: Patient transferred out to floor yesterday  Awake, alert Son is at bedside Had some PO's yesterday - soft diet No nausea or vomiting Stool output   Objective: Vital signs in last 24 hours: Temp:  [98 F (36.7 C)-99 F (37.2 C)] 99 F (37.2 C) (02/04 0620) Pulse Rate:  [66-100] 100 (02/04 0620) Resp:  [16-24] 18 (02/04 0620) BP: (135-166)/(50-94) 135/94 (02/04 0620) SpO2:  [91 %-96 %] 92 % (02/04 0620) Weight:  [116.6 kg-118.7 kg] 116.6 kg (02/04 0635) Last BM Date: 01/08/21  Intake/Output from previous day: 02/03 0701 - 02/04 0700 In: 365.8 [P.O.:240; IV Piggyback:125.8] Out: 825 [Urine:550; Stool:275] Intake/Output this shift: No intake/output data recorded.  Elderly female - NAD Abd - soft, mild tenderness; midline incision c/d/I Ostomy - pink edematous, some prolapse - stool output  Lab Results:  Recent Labs    01/06/22 0238 01/07/22 0306  WBC 9.8 7.5  HGB 9.9* 9.6*  HCT 30.1* 28.4*  PLT 243 215   BMET Recent Labs    01/07/22 0306 01/08/22 0412  NA 142 136  K 4.7 3.7  CL 112* 101  CO2 25 27  GLUCOSE 106* 110*  BUN 13 12  CREATININE 0.62 0.69  CALCIUM 8.2* 8.3*   PT/INR No results for input(s): LABPROT, INR in the last 72 hours. ABG No results for input(s): PHART, HCO3 in the last 72 hours.  Invalid input(s): PCO2, PO2  Studies/Results: No results found.  Anti-infectives: Anti-infectives (From admission, onward)    Start     Dose/Rate Route Frequency Ordered Stop   01/03/22 0400  piperacillin-tazobactam (ZOSYN) IVPB 3.375 g        3.375 g 12.5 mL/hr over 240 Minutes Intravenous Every 8 hours 01/02/22 2337 01/08/22 0100   01/02/22 1930  piperacillin-tazobactam (ZOSYN) IVPB 3.375 g        3.375 g 100 mL/hr over 30 Minutes Intravenous  Once 01/02/22 1922 01/02/22 2134       Assessment/Plan: Bowel perforation and septic shock - off pressors and HD stable, septic shock resolved s/p source  control  POD 5 S/P exploratory laparotomy with left colectomy and transverse colostomy 01/03/22 Dr. Thermon Leyland Post-op delirium - multimodal pain control, maximize non-narcotic meds - having stool output from stoma - soft diet - WOC care appreciated, stoma is edematous today, will continue to monitor  - WBC normalized today, d/c abx today - change inferior dressing at least BID but prn for saturation; remove Penrose prior to discharge - continue to mobilize and make sure patient is turning frequently  - hopefully short term SNF/rehab    FEN: SOFT diet, SLIV VTE: SQH, SCDs ID: Zosyn 1/29>>2/4 Foley: removed 2/1, voiding; Purewick   Recent UTI OSA AKI - Cr 0.6, resolved  LOS: 6 days    Terri Rodriguez 01/08/2022

## 2022-01-08 NOTE — Progress Notes (Signed)
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 She had been attempting to have a bowel cleansing at home with a trial of MiraLAX starting on 12/28/2021 with little success.  She ultimately presented on 01/02/2022 with ongoing difficulty having bowel movement and ensuing hypotension. CT abdomen/pelvis performed revealed free air concerning for underlying bowel perforation with site on visualized but presumed colonic.  She was evaluated by general surgery and underwent urgent ex lap which showed perforated splenic flexure diverticulitis and she was treated with colon resection and placement of colostomy.  Surgery performed on 01/03/2022.  Assessment and Plan: * Bowel perforation ([place])- (present on admission) - Patient presented with hypotension and difficulty with bowel movements at home and having failed laxative trial; CT abdomen/pelvis showed perforated viscus.  Ex lap performed on 01/03/2022 showed perforated splenic flexure diverticulitis; treated with resection and colostomy placement - ostomy bag now noted with stool, 01/05/22 - NGT removed 2/1; diet advanced further to soft per surgery - continue monitoring ostomy output; will eventually need ostomy education once more awake/alert/oriented  Diverticulitis - see perforated colon as well - s/p ostomy now in place - continue zosyn for 5 days total per surgery given OR findings   Elevated blood pressure reading - treat pain as needed - using labetalol or hydralazine PRN also  Delirium due to multiple etiologies, acute, hyperactive - patient has known history of delirium in the past after surgery.  Suspect this is multifactorial in setting of acute infection s/p perforated colon with sedation/anesthesia as well as pain component and also some ICU delirium -Mentation has actually continued to improve over the past couple days.  She is now on progressive unit and remains awake, alert, and cooperative. - continue treating pain as necessary   Physical deconditioning - Some expected deconditioning in setting of infection, abdominal surgery, and anticipated prolonged hospitalization/recovery -PT/OT working with patient - Tentative plan will likely be for short-term rehab  IBS (irritable bowel syndrome) - likely constipation predominant; did not tolerate/respond to miralax trial prior to admission - continue monitoring ostomy output and will need ongoing bowel regimen at discharge   Obstructive sleep apnea- (present on admission) - continue nightly CPAP  Normocytic anemia - baseline approx 12g/dL - some downtrend not unexpected in setting of recent surgery and possibly some hemodilution; no overt bleeding appreciated - continue trending Hgb daily  -Hemoglobin has continued to recover and is back to normal range as of 01/08/2022 (12.5 g/dL)  Sepsis (HCC)-resolved as of 01/05/2022 - leukocytosis, tachycardia, source considered bowel perforation - continue abx - s/p surgical repair with ex lap and resection with colostomy placement on 01/03/22 - completed 5 days abx  Hypovolemic shock (HCC)-resolved as of 01/05/2022 - presented with syncope and hypovolemia; started on Levophed in ICU which was weaned off on 01/03/22     Subjective: No events overnight.  Son-in-law present this afternoon with update given and questions answered.  Developed a little bit of nausea later on in the afternoon, and her mentation is much better today. She was comfortably resting in bed and answering questions easily.  Physical Exam: Vitals:   01/08/22 0300 01/08/22 0620 01/08/22 1700 01/08/22  1521  BP:  (!)  135/94  (!) 146/68  Pulse:  100  99  Resp:  18  17  Temp:  99 F (37.2 C)  99.3 F (37.4 C)  TempSrc:  Oral  Oral  SpO2: 95% 92%  93%  Weight:   116.6 kg   Height:       Physical Exam Vitals reviewed.  Constitutional:      Comments: Pleasant elderly woman resting in bed appearing much more comfortable; no distress  HENT:     Head: Normocephalic and atraumatic.     Mouth/Throat:     Mouth: Mucous membranes are moist.  Eyes:     Extraocular Movements: Extraocular movements intact.  Cardiovascular:     Rate and Rhythm: Normal rate and regular rhythm.  Pulmonary:     Effort: Pulmonary effort is normal.     Breath sounds: Normal breath sounds.  Abdominal:     Palpations: Abdomen is soft.     Comments: Ostomy bag in place noted with brown stool. Stoma large but healthy appearing. BS present   Musculoskeletal:        General: Normal range of motion.     Cervical back: Normal range of motion and neck supple.  Skin:    General: Skin is dry.  Neurological:     General: No focal deficit present.     Data Reviewed:  I have Reviewed nursing notes, Vitals, and Lab results since pt's last encounter. Pertinent lab results Hgb 12.5, WBC 9.7 I have ordered test including CBC, Mg, BMP I have reviewed the last note from staff over past 24 hours,  I have discussed pt's care plan and test results with nursing staff.   Family Communication: son in law  Disposition: Status is: Inpatient Remains inpatient appropriate because: Ongoing treatment for perforated bowel s/p resection and colostomy placement     Planned Discharge Destination: Skilled nursing facility DVT ppx: HSQ    Author: Dwyane Dee, MD 01/08/2022 4:55 PM  For on call review [email or web address].

## 2022-01-09 DIAGNOSIS — Z933 Colostomy status: Secondary | ICD-10-CM

## 2022-01-09 DIAGNOSIS — K55039 Acute (reversible) ischemia of large intestine, extent unspecified: Secondary | ICD-10-CM | POA: Diagnosis not present

## 2022-01-09 DIAGNOSIS — R5381 Other malaise: Secondary | ICD-10-CM | POA: Diagnosis not present

## 2022-01-09 DIAGNOSIS — F05 Delirium due to known physiological condition: Secondary | ICD-10-CM | POA: Diagnosis not present

## 2022-01-09 LAB — BASIC METABOLIC PANEL
Anion gap: 15 (ref 5–15)
BUN: 18 mg/dL (ref 8–23)
CO2: 19 mmol/L — ABNORMAL LOW (ref 22–32)
Calcium: 8.4 mg/dL — ABNORMAL LOW (ref 8.9–10.3)
Chloride: 103 mmol/L (ref 98–111)
Creatinine, Ser: 0.68 mg/dL (ref 0.44–1.00)
GFR, Estimated: >60 mL/min (ref >60)
Glucose, Bld: 84 mg/dL (ref 70–99)
Potassium: 4.2 mmol/L (ref 3.5–5.1)
Sodium: 137 mmol/L (ref 135–145)

## 2022-01-09 LAB — CBC
HCT: 36.9 % (ref 36.0–46.0)
Hemoglobin: 11.2 g/dL — ABNORMAL LOW (ref 12.0–15.0)
MCH: 30.3 pg (ref 26.0–34.0)
MCHC: 30.4 g/dL (ref 30.0–36.0)
MCV: 99.7 fL (ref 80.0–100.0)
Platelets: 278 10*3/uL (ref 150–400)
RBC: 3.7 MIL/uL — ABNORMAL LOW (ref 3.87–5.11)
RDW: 14.7 % (ref 11.5–15.5)
WBC: 11.3 10*3/uL — ABNORMAL HIGH (ref 4.0–10.5)
nRBC: 0 % (ref 0.0–0.2)

## 2022-01-09 LAB — MAGNESIUM: Magnesium: 1.9 mg/dL (ref 1.7–2.4)

## 2022-01-09 LAB — PHOSPHORUS: Phosphorus: 3.6 mg/dL (ref 2.5–4.6)

## 2022-01-09 MED ORDER — ONDANSETRON HCL 4 MG/2ML IJ SOLN
4.0000 mg | INTRAMUSCULAR | Status: DC | PRN
  Administered 2022-01-09 – 2022-01-10 (×3): 4 mg via INTRAVENOUS
  Filled 2022-01-09 (×4): qty 2

## 2022-01-09 NOTE — Consult Note (Signed)
WOC Nurse ostomy follow up Reconsulted for ostomy teaching.  This patient is known to our team and we will be following for ostomy education, involving both daughter and husband.  Next visit is planned for Monday, 01/10/22.  [place] nursing team will not follow, but will remain available to this patient, the nursing and medical teams.  Please re-consult if needed. Thanks, Maudie Flakes, MSN, RN, [place], Arther Abbott  Pager# [phone]

## 2022-01-09 NOTE — Progress Notes (Signed)
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 She had been attempting to have a bowel cleansing at home with a trial of MiraLAX starting on 12/28/2021 with little success.  She ultimately presented on 01/02/2022 with ongoing difficulty having bowel movement and ensuing hypotension. CT abdomen/pelvis performed revealed free air concerning for underlying bowel perforation with site on visualized but presumed colonic.  She was evaluated by general surgery and underwent urgent ex lap which showed perforated splenic flexure diverticulitis and she was treated with colon resection and placement of colostomy.  Surgery performed on 01/03/2022.  Assessment and Plan: Perforation of colon s/p Hartmann colectomy/colostomy 01/03/2022- (present on admission) - Patient presented with hypotension and difficulty with bowel movements at home and having failed laxative trial; CT abdomen/pelvis showed perforated viscus.  Ex lap performed on 01/03/2022 showed perforated splenic flexure diverticulitis; treated with resection and colostomy placement - ostomy bag now noted with stool consistently since 01/05/22 -Penrose drain to be removed prior to discharge - NGT removed 2/1; diet advanced further to soft per surgery -Now that she is awake and alert, needs ostomy teaching  Diverticulitis - see perforated colon as well - s/p ostomy now in place -Completed 5-day course of Zosyn on 01/07/2022   Elevated blood pressure reading-resolved as of 01/09/2022 - treat pain as needed - using labetalol or hydralazine PRN also  Delirium due to multiple etiologies, acute, hyperactive-resolved as of  01/09/2022 - patient has known history of delirium in the past after surgery. Suspect this is multifactorial in setting of acute infection s/p perforated colon with sedation/anesthesia as well as pain component and also some ICU delirium -Mentation has actually continued to improve over the past couple days.  She is now on progressive unit and remains awake, alert, and cooperative. - continue treating pain as necessary   Physical deconditioning - Some expected deconditioning in setting of infection, abdominal surgery, and anticipated prolonged hospitalization/recovery -PT/OT working with patient - Tentative plan will likely be for short-term rehab  IBS (irritable bowel syndrome) - likely constipation predominant; did not tolerate/respond to miralax trial prior to admission - continue monitoring ostomy output and will need ongoing bowel regimen at discharge   Obstructive sleep apnea- (present on admission) - continue nightly CPAP  Normocytic anemia - baseline approx 12g/dL - some downtrend not unexpected in setting of recent surgery and possibly some hemodilution; no overt bleeding appreciated - continue trending Hgb daily  -Hemoglobin has continued to recover and is back to normal range as of 01/08/2022 (12.5 g/dL)  Sepsis (HCC)-resolved as of 01/05/2022 - leukocytosis, tachycardia, source considered bowel perforation - continue abx - s/p surgical repair with ex lap and resection with colostomy placement on 01/03/22 - completed 5 days abx  Hypovolemic shock (HCC)-resolved as of 01/05/2022 - presented with syncope and hypovolemia; started on Levophed in ICU which was weaned off on 01/03/22     Subjective: No events overnight.  Husband present bedside this morning.  He states she does very well when somebody is able to stand the room overnight.  She is otherwise awake, alert and conversing appropriately.  Delirium seems to be resolved and husband states she is  back to normal  mentation.  Physical Exam: Vitals:   01/08/22 1957 01/08/22 2229 01/09/22 0437 01/09/22 0441  BP: (!) 145/72   (!) 142/59  Pulse: 91 94  98  Resp: 18 17  18   Temp: 97.8 F (36.6 C)   98.7 F (37.1 C)  TempSrc: Oral   Oral  SpO2: 95% 96%  92%  Weight:   113.7 kg   Height:       Physical Exam Vitals reviewed.  Constitutional:      Comments: Pleasant elderly woman resting in bed appearing much more comfortable; no distress  HENT:     Head: Normocephalic and atraumatic.     Mouth/Throat:     Mouth: Mucous membranes are moist.  Eyes:     Extraocular Movements: Extraocular movements intact.  Cardiovascular:     Rate and Rhythm: Normal rate and regular rhythm.  Pulmonary:     Effort: Pulmonary effort is normal.     Breath sounds: Normal breath sounds.  Abdominal:     Palpations: Abdomen is soft.     Comments: Ostomy bag in place noted with large amounts of stool. Stoma large but healthy appearing. BS present   Musculoskeletal:        General: Normal range of motion.     Cervical back: Normal range of motion and neck supple.  Skin:    General: Skin is dry.  Neurological:     General: No focal deficit present.     Data Reviewed:  I have Reviewed nursing notes, Vitals, and Lab results since pt's last encounter. Pertinent lab results Hgb 11.2, WBC 11.3 I have ordered test including CBC, Mg, BMP I have reviewed the last note from staff over past 24 hours,  I have discussed pt's care plan and test results with nursing staff.   Family Communication: son in law  Disposition: Status is: Inpatient Remains inpatient appropriate because: Ongoing treatment for perforated bowel s/p resection and colostomy placement     Planned Discharge Destination: Skilled nursing facility DVT ppx: HSQ    Author: Dwyane Dee, MD 01/09/2022 12:51 PM  For on call review [email or web address].

## 2022-01-09 NOTE — Progress Notes (Signed)
6 Days Post-Op   Subjective/Chief Complaint: Patient resting comfortably Some complaints of nausea yesterday - limited PO intake Large amount of stool output   Objective: Vital signs in last 24 hours: Temp:  [97.8 F (36.6 C)-99.3 F (37.4 C)] 98.7 F (37.1 C) (02/05 0441) Pulse Rate:  [91-99] 98 (02/05 0441) Resp:  [17-18] 18 (02/05 0441) BP: (142-146)/(59-72) 142/59 (02/05 0441) SpO2:  [92 %-96 %] 92 % (02/05 0441) Weight:  [113.7 kg] 113.7 kg (02/05 0437) Last BM Date: 01/08/22  Intake/Output from previous day: 02/04 0701 - 02/05 0700 In: -  Out: 1200 [Urine:350; Stool:850] Intake/Output this shift: No intake/output data recorded.  Elderly female - NAD Abd - soft, mild tenderness; midline incision c/d/I Ostomy - pink, less edematous, mild prolapse -  copious thin liquid stool output  Lab Results:  Recent Labs    01/07/22 0306 01/08/22 0951  WBC 7.5 9.7  HGB 9.6* 12.5  HCT 28.4* 38.7  PLT 215 274   BMET Recent Labs    01/08/22 0412 01/09/22 0355  NA 136 137  K 3.7 4.2  CL 101 103  CO2 27 19*  GLUCOSE 110* 84  BUN 12 18  CREATININE 0.69 0.68  CALCIUM 8.3* 8.4*   PT/INR No results for input(s): LABPROT, INR in the last 72 hours. ABG No results for input(s): PHART, HCO3 in the last 72 hours.  Invalid input(s): PCO2, PO2  Studies/Results: No results found.  Anti-infectives: Anti-infectives (From admission, onward)    Start     Dose/Rate Route Frequency Ordered Stop   01/03/22 0400  piperacillin-tazobactam (ZOSYN) IVPB 3.375 g        3.375 g 12.5 mL/hr over 240 Minutes Intravenous Every 8 hours 01/02/22 2337 01/08/22 0100   01/02/22 1930  piperacillin-tazobactam (ZOSYN) IVPB 3.375 g        3.375 g 100 mL/hr over 30 Minutes Intravenous  Once 01/02/22 1922 01/02/22 2134       Assessment/Plan: Bowel perforation and septic shock - off pressors and HD stable, septic shock resolved s/p source control  POD 6 S/P exploratory laparotomy with left  colectomy and transverse colostomy 01/03/22 Dr. Thermon Leyland Post-op delirium - multimodal pain control, maximize non-narcotic meds - having stool output from stoma - soft diet; still with some nausea - Zofran q4 PRN - WOC care appreciated, stoma is edematous today, will continue to monitor  - WBC normalized today, d/c abx - change inferior dressing at least BID but prn for saturation; remove Penrose prior to discharge - continue to mobilize and make sure patient is turning frequently  - hopefully short term SNF/rehab     FEN: SOFT diet, SLIV VTE: SQH, SCDs ID: Zosyn 1/29>>2/4 Foley: removed 2/1, voiding; Purewick   Recent UTI OSA AKI - Cr 0.6, resolved  LOS: 7 days    Maia Petties 01/09/2022

## 2022-01-10 DIAGNOSIS — Z933 Colostomy status: Secondary | ICD-10-CM | POA: Diagnosis not present

## 2022-01-10 DIAGNOSIS — K5792 Diverticulitis of intestine, part unspecified, without perforation or abscess without bleeding: Secondary | ICD-10-CM | POA: Diagnosis not present

## 2022-01-10 DIAGNOSIS — R5381 Other malaise: Secondary | ICD-10-CM | POA: Diagnosis not present

## 2022-01-10 DIAGNOSIS — K55039 Acute (reversible) ischemia of large intestine, extent unspecified: Secondary | ICD-10-CM | POA: Diagnosis not present

## 2022-01-10 MED ORDER — METHOCARBAMOL 500 MG PO TABS: 500.0000 mg | ORAL_TABLET | Freq: Four times a day (QID) | ORAL | Status: DC | PRN

## 2022-01-10 MED ORDER — ENOXAPARIN SODIUM 60 MG/0.6ML IJ SOSY
60.0000 mg | PREFILLED_SYRINGE | Freq: Every day | INTRAMUSCULAR | Status: DC
  Administered 2022-01-10 – 2022-01-12 (×3): 60 mg via SUBCUTANEOUS
  Filled 2022-01-10 (×4): qty 0.6

## 2022-01-10 MED ORDER — ONDANSETRON HCL 4 MG/2ML IJ SOLN: 4.0000 mg | Freq: Four times a day (QID) | INTRAMUSCULAR | Status: DC | PRN

## 2022-01-10 MED ORDER — ACETAMINOPHEN 500 MG PO TABS: 1000.0000 mg | ORAL_TABLET | Freq: Four times a day (QID) | ORAL | Status: DC | PRN

## 2022-01-10 NOTE — Consult Note (Addendum)
[place] Nurse ostomy follow up Stoma type/location:  Pt had colostomy surgery performed 1/30. Stomal assessment/size: Stoma is edematous, but slowly decreasing in size.  Decreased to 2 1/4 inches and I was able to apply a regular sized pouching system today, instead of the previous 4 inch pouch which was applied last week. Stoma is moderately prolapsed and needs to be lifted and inserted into the pouch opening for a proper fit.   Emptied 425cc liquid brown stool.  Educated patient that the pouch was overfilled and this may cause it to burst, and to call for assistance when this occurs.  Encouraged her to practice emptying the pouch when she is able to walk into the bathroom.  Peristomal skin: Red moist partial thickness excoriation underneath the stoma, where is is heavy and laying against the skin.  Applied barrier ring around the edges to attempt to protect the location. Education provided: Demonstrated pouch change to patient and husband and daughter at the bedside.  Applied 2 piece pouch and barrier ring, but patient feels a one piece will be easier to apply and I will use that next time.  5 sets of supplies left at the bedside; one piece pouch, Lawson # [phone] and barrier ring Kellie Simmering # [id number].  Educational materials in the room.  Reviewed pouching routines and ordering supplies.  Pt and husband request another teaching session Wed at 10:00.   Pt could benefit from home health assistance if she does not discharge to a rehab facility. Enrolled patient in [place] Start Discharge program: Yes Julien Girt MSN, RN, [place], [place], [place]

## 2022-01-10 NOTE — Progress Notes (Addendum)
7 Days Post-Op  Subjective: CC: No real abdominal pain this morning. Had some soreness yesterday around the upper portion of her incision Nausea throughout the day. Dry heaves around 10pm. No emesis. She states this is not worsened by po food intake. Does feel the scheduled tylenol will make her symptoms worse. Did well with soft diet for dinner yesterday. Having ostomy output. Has not emptied her ostomy yet.  Going to work with WOCN around 10am this morning. Daughter and husband will be present for teaching as well.  Patient got up to chair yesterday. Did not ambulate. Last PT session on 2/3 and recommended SNF. They appear to be on patient's care team to work with her again today.  Voiding.  Family states that her post op delirium seems to be improving.  Patient does not like getting woken up early in the morning for scheduled tylenol and subq heparin.  Pre-op patient lived with her husband. Used a cane occasionally. Able to mobilize well in her home including 16 stairs to get to the 2nd floor of her home.   Objective: Vital signs in last 24 hours: Temp:  [98 F (36.7 C)-98.9 F (37.2 C)] 98.6 F (37 C) (02/06 0512) Pulse Rate:  [91-95] 95 (02/06 0512) Resp:  [18-20] 20 (02/06 0512) BP: (146-157)/(64-71) 151/71 (02/06 0512) SpO2:  [91 %-93 %] 92 % (02/06 0512) Weight:  [113.1 kg] 113.1 kg (02/06 0500) Last BM Date: 01/09/22  Intake/Output from previous day: 02/05 0701 - 02/06 0700 In: -  Out: 1500 [Urine:1000; Stool:500] Intake/Output this shift: No intake/output data recorded.  PE: Gen:  Alert, NAD, pleasant Pulm: normal rate and effort  Abd: Soft, appropriately ttp, mildly distended, stoma beefy red but edematous this AM and having liquid stool output, midline wound with staples present, penrose drain in inferior incision with some dried SS appearing fluid  Lab Results:  Recent Labs    01/08/22 0951 01/09/22 0355  WBC 9.7 11.3*  HGB 12.5 11.2*  HCT 38.7 36.9   PLT 274 278   BMET Recent Labs    01/08/22 0412 01/09/22 0355  NA 136 137  K 3.7 4.2  CL 101 103  CO2 27 19*  GLUCOSE 110* 84  BUN 12 18  CREATININE 0.69 0.68  CALCIUM 8.3* 8.4*   PT/INR No results for input(s): LABPROT, INR in the last 72 hours. CMP     Component Value Date/Time   NA 137 01/09/2022 0355   NA 142 06/17/2019 1109   K 4.2 01/09/2022 0355   CL 103 01/09/2022 0355   CO2 19 (L) 01/09/2022 0355   GLUCOSE 84 01/09/2022 0355   GLUCOSE 88 11/08/2006 1015   BUN 18 01/09/2022 0355   BUN 22 06/17/2019 1109   CREATININE 0.68 01/09/2022 0355   CREATININE 0.70 10/15/2020 0922   CALCIUM 8.4 (L) 01/09/2022 0355   PROT 5.2 (L) 01/04/2022 0245   PROT 6.4 06/17/2019 1109   ALBUMIN 2.4 (L) 01/04/2022 0245   ALBUMIN 4.5 06/17/2019 1109   AST 31 01/04/2022 0245   ALT 21 01/04/2022 0245   ALKPHOS 86 01/04/2022 0245   BILITOT 1.0 01/04/2022 0245   BILITOT 0.3 06/17/2019 1109   GFRNONAA >60 01/09/2022 0355   GFRNONAA 85 10/15/2020 0922   GFRAA 98 10/15/2020 0922   Lipase  No results found for: LIPASE  Studies/Results: No results found.  Anti-infectives: Anti-infectives (From admission, onward)    Start     Dose/Rate Route Frequency Ordered Stop   01/03/22  0400  piperacillin-tazobactam (ZOSYN) IVPB 3.375 g        3.375 g 12.5 mL/hr over 240 Minutes Intravenous Every 8 hours 01/02/22 2337 01/08/22 0100   01/02/22 1930  piperacillin-tazobactam (ZOSYN) IVPB 3.375 g        3.375 g 100 mL/hr over 30 Minutes Intravenous  Once 01/02/22 1922 01/02/22 2134        Assessment/Plan POD 7 s/p exploratory laparotomy with left colectomy and transverse colostomy for perforated splenic flexure of colon due to ischemia or diverticular disease on 01/03/22 by Dr. Thermon Leyland - Shock improved - septic shock resolved s/p source control. Now off pressors, HD stable and  out of the unit - Post-op delirium seems to be improving.  - Cont multimodal pain control, maximize  non-narcotic meds - Having stool output from stoma - soft diet; still with some nausea - Zofran q6hrs PRN. Compazine for breakthrough n/v. QTc 488 on 2/1. She is also on scheduled Seroquel. Will repeat EKG today to check QT. I have let [facility] know.  - WOC care appreciated. Plan for patient and family teaching today.  - Completed post op abx x 5 days - Change inferior dressing at least BID but prn for saturation; remove Penrose prior to discharge - Path without evidence of malignancy. Segment of colon noted with perforation, ischemic changes and acute serositis  - Pulm toilet - Continue to mobilize. PT rec SNF.    FEN: Soft diet, ensure, SLIV VTE: SCDs. Switch from subq heparin to Lovenox  ID: Zosyn 1/29>>2/4. WBC 11.3 yesterday. No labs today. Afebrile.  Foley: removed 2/1, voiding; Minimize purewick to encourage mobilization - try to get to bedside commode to void as much as possible.    Recent UTI OSA AKI - Resolved  This care required moderate level of medical decision making. Post op   LOS: 8 days    Jillyn Ledger , [facility] Surgery 01/10/2022, 9:07 AM Please see Amion for pager number during day hours 7:00am-4:30pm

## 2022-01-10 NOTE — Progress Notes (Signed)
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 She had been attempting to have a bowel cleansing at home with a trial of MiraLAX starting on 12/28/2021 with little success.  She ultimately presented on 01/02/2022 with ongoing difficulty having bowel movement and ensuing hypotension. CT abdomen/pelvis performed revealed free air concerning for underlying bowel perforation with site on visualized but presumed colonic.  She was evaluated by general surgery and underwent urgent ex lap which showed perforated splenic flexure diverticulitis and she was treated with colon resection and placement of colostomy.  Surgery performed on 01/03/2022.  Assessment and Plan: Perforation of colon s/p Hartmann colectomy/colostomy 01/03/2022- (present on admission) - Patient presented with hypotension and difficulty with bowel movements at home and having failed laxative trial; CT abdomen/pelvis showed perforated viscus.  Ex lap performed on 01/03/2022 showed perforated splenic flexure diverticulitis; treated with resection and colostomy placement - ostomy bag now noted with stool consistently since 01/05/22 -Penrose drain to be removed prior to discharge - NGT removed 2/1; diet advanced further to soft per surgery -Now that she is awake and alert, needs ostomy teaching  Diverticulitis - see perforated colon as well - s/p ostomy now in place -Completed 5-day course of Zosyn on 01/07/2022   Elevated blood pressure reading-resolved as of 01/09/2022 - treat pain as needed - using labetalol or hydralazine PRN also  Delirium due to multiple etiologies, acute, hyperactive-resolved as of  01/09/2022 - patient has known history of delirium in the past after surgery. Suspect this is multifactorial in setting of acute infection s/p perforated colon with sedation/anesthesia as well as pain component and also some ICU delirium -Mentation has actually continued to improve over the past couple days.  She is now on progressive unit and remains awake, alert, and cooperative. - continue treating pain as necessary   Physical deconditioning - Some expected deconditioning in setting of infection, abdominal surgery, and anticipated prolonged hospitalization/recovery -PT/OT working with patient - Tentative plan will likely be for short-term rehab  IBS (irritable bowel syndrome) - likely constipation predominant; did not tolerate/respond to miralax trial prior to admission - continue monitoring ostomy output and will need ongoing bowel regimen at discharge   Obstructive sleep apnea- (present on admission) - continue nightly CPAP  Normocytic anemia - baseline approx 12g/dL - some downtrend not unexpected in setting of recent surgery and possibly some hemodilution; no overt bleeding appreciated - continue trending Hgb daily  -Hemoglobin has continued to recover and is back to normal range as of 01/08/2022 (12.5 g/dL)  Sepsis (HCC)-resolved as of 01/05/2022 - leukocytosis, tachycardia, source considered bowel perforation - continue abx - s/p surgical repair with ex lap and resection with colostomy placement on 01/03/22 - completed 5 days abx  Hypovolemic shock (HCC)-resolved as of 01/05/2022 - presented with syncope and hypovolemia; started on Levophed in ICU which was weaned off on 01/03/22     Subjective: No events overnight.  She had some ostomy teaching today and will again on Wednesday. Have informed her, we're approaching being medically ready to leave the hospital soon and will need to just keep working with PT more and getting more ostomy education.  Physical Exam: Vitals:    01/09/22 2129 01/10/22 0500 01/10/22 0512 01/10/22 1303  BP:   (!) 151/71 137/67  Pulse: 91  95 81  Resp: 18  20 18   Temp:   98.6 F (37 C) 98.4 F (36.9 C)  TempSrc:   Oral Oral  SpO2: 93%  92% 94%  Weight:  113.1 kg    Height:       Physical Exam Vitals reviewed.  Constitutional:      Comments: Pleasant elderly woman resting in bed appearing much more comfortable; no distress  HENT:     Head: Normocephalic and atraumatic.     Mouth/Throat:     Mouth: Mucous membranes are moist.  Eyes:     Extraocular Movements: Extraocular movements intact.  Cardiovascular:     Rate and Rhythm: Normal rate and regular rhythm.  Pulmonary:     Effort: Pulmonary effort is normal.     Breath sounds: Normal breath sounds.  Abdominal:     Palpations: Abdomen is soft.     Comments: Ostomy bag in place noted with large amounts of stool. Stoma large but healthy appearing. BS present   Musculoskeletal:        General: Normal range of motion.     Cervical back: Normal range of motion and neck supple.  Skin:    General: Skin is dry.  Neurological:     General: No focal deficit present.     Data Reviewed:  I have Reviewed nursing notes, Vitals, and Lab results since pt's last encounter. Pertinent lab results Hgb 11.2, WBC 11.3 I have ordered test including CBC, Mg, BMP I have reviewed the last note from staff over past 24 hours,  I have discussed pt's care plan and test results with nursing staff.   Family Communication: husband  Disposition: Status is: Inpatient Remains inpatient appropriate because: Ongoing treatment for perforated bowel s/p resection and colostomy placement     Planned Discharge Destination: Skilled nursing facility DVT ppx: HSQ    Author: Dwyane Dee, MD 01/10/2022 2:08 PM  For on call review [email or web address].

## 2022-01-10 NOTE — TOC Progression Note (Signed)
Transition of Care [facility]) - Progression Note    Patient Details  Name: Terri Rodriguez MRN: [id number] Date of Birth: 1946/05/12  Transition of [facility]) CM/SW Contact  Leeroy Cha, RN Phone Number: 01/10/2022, 8:24 AM  Clinical Narrative:    Awaiting snf placement following for any toc needs   Expected Discharge Plan: [place] Barriers to Discharge: Continued Medical Work up  Expected Discharge Plan and Services Expected Discharge Plan: [place] In-house Referral: Clinical Social Work   Post Acute Care Choice: [place] Living arrangements for the past 2 months: Single Family Home                 DME Arranged: N/A DME Agency: NA                   Social Determinants of Health (SDOH) Interventions    Readmission Risk Interventions No flowsheet data found.

## 2022-01-10 NOTE — Progress Notes (Signed)
Physical Therapy Treatment Patient Details Name: Terri Rodriguez MRN: [id number] DOB: 1946/11/06 Today's Date: 01/10/2022   History of Present Illness Patient admitted for bowel perforation and septic shock  Patient now S/P exploratory laparotomy with left colectomy and transverse colostomy 01/03/22  and now presents with Post-op delirium.    PT Comments    Pt motivated to work with PT. After getting to [facility], pt c/o dizziness. Deferred hallway ambulation and assisted pt over to recliner using RW. BP 132/72, HR 113 bpm, O2 95% on RA. Will continue to progress activity as safely able. Continue to recommend ST SNF rehab.     Recommendations for follow up therapy are one component of a multi-disciplinary discharge planning process, led by the attending physician.  Recommendations may be updated based on patient status, additional functional criteria and insurance authorization.  Follow Up Recommendations  Skilled nursing-short term rehab (<3 hours/day)     Assistance Recommended at Discharge Frequent or constant Supervision/Assistance  Patient can return home with the following A little help with walking and/or transfers;A little help with bathing/dressing/bathroom;Assistance with cooking/housework;Direct supervision/assist for medications management;Assist for transportation;Help with stairs or ramp for entrance   Equipment Recommendations  None recommended by PT    Recommendations for Other Services       Precautions / Restrictions Precautions Precautions: Fall Precaution Comments: L colostomy, abd penrose drain Restrictions Weight Bearing Restrictions: No     Mobility  Bed Mobility Overal bed mobility: Needs Assistance Bed Mobility: Supine to Sit     Supine to sit: HOB elevated, Mod assist     General bed mobility comments: Assist for trunk and LEs. Increased time. HOB maximally elevated. Cues for safety, technique, Pt used bedrail to assist.    Transfers Overall transfer  level: Needs assistance Equipment used: Rolling walker (2 wheels) Transfers: Sit to/from Stand, Bed to chair/wheelchair/BSC Sit to Stand: Min assist   Step pivot transfers: Min assist       General transfer comment: Assist to rise, steady, control descent. Cues for safety.    Ambulation/Gait Ambulation/Gait assistance: Min assist, +2 safety/equipment Gait Distance (Feet): 5 Feet Assistive device: Rolling walker (2 wheels) Gait Pattern/deviations: Step-through pattern, Decreased stride length       General Gait Details: Plan was to attempt hallway ambulation however pt c/o dizziness after rising from commode. Deferred for safety reasons. Pt took a few steps over to recliner using RW. BP 132/72 once seated in recliner.   Stairs             Wheelchair Mobility    Modified Rankin (Stroke Patients Only)       Balance Overall balance assessment: Needs assistance, History of Falls         Standing balance support: Bilateral upper extremity supported Standing balance-Leahy Scale: Poor                              Cognition Arousal/Alertness: Awake/alert Behavior During Therapy: WFL for tasks assessed/performed Overall Cognitive Status: Within Functional Limits for tasks assessed                                 General Comments: for the most part, appears to be clearing mentally        Exercises      General Comments        Pertinent Vitals/Pain Pain Assessment Pain Assessment: Faces Faces Pain  Scale: Hurts a little bit Pain Location: abdominal incision Pain Descriptors / Indicators: Discomfort, Sore Pain Intervention(s): Monitored during session, Repositioned    Home Living                          Prior Function            PT Goals (current goals can now be found in the care plan section) Progress towards PT goals: Progressing toward goals    Frequency    Min 3X/week      PT Plan Frequency needs  to be updated    Co-evaluation              AM-PAC PT "6 Clicks" Mobility   Outcome Measure  Help needed turning from your back to your side while in a flat bed without using bedrails?: A Lot Help needed moving from lying on your back to sitting on the side of a flat bed without using bedrails?: A Lot Help needed moving to and from a bed to a chair (including a wheelchair)?: A Little Help needed standing up from a chair using your arms (e.g., wheelchair or bedside chair)?: A Little Help needed to walk in hospital room?: A Lot Help needed climbing 3-5 steps with a railing? : Total 6 Click Score: 13    End of Session Equipment Utilized During Treatment: Gait belt Activity Tolerance: Patient tolerated treatment well (but limited by dizziness) Patient left: in chair;with call bell/phone within reach   PT Visit Diagnosis: Muscle weakness (generalized) (M62.81);Difficulty in walking, not elsewhere classified (R26.2)     Time: 7939-0300 PT Time Calculation (min) (ACUTE ONLY): 25 min  Charges:  $Gait Training: 8-22 mins $Therapeutic Activity: 8-22 mins                       Doreatha Massed, PT Acute Rehabilitation  Office: [phone] Pager: [phone]

## 2022-01-10 NOTE — Care Management Important Message (Signed)
Important Message  Patient Details IM Letter placed in Patients room. Name: [name] MRN: [id number] Date of Birth: August 29, 1946   Medicare Important Message Given:  Yes     Kerin Salen 01/10/2022, 1:34 PM

## 2022-01-10 NOTE — Progress Notes (Signed)
ANTICOAGULATION CONSULT NOTE - Initial Consult  Pharmacy Consult for Lovenox Indication: VTE prophylaxis  Allergies  Allergen Reactions   Codeine     nausea   Crab [Shellfish Allergy] Itching and Nausea And Vomiting    Patient Measurements: Height: 5' 4.75" (164.5 cm) Weight: 113.1 kg (249 lb 5.4 oz) IBW/kg (Calculated) : 56.43  Vital Signs: Temp: 98.6 F (37 C) (02/06 0512) Temp Source: Oral (02/06 0512) BP: 151/71 (02/06 0512) Pulse Rate: 95 (02/06 0512)  Labs: Recent Labs    01/08/22 0412 01/08/22 0951 01/09/22 0355  HGB  --  12.5 11.2*  HCT  --  38.7 36.9  PLT  --  274 278  CREATININE 0.69  --  0.68    Estimated Creatinine Clearance: 74.7 mL/min (by C-G formula based on SCr of 0.68 mg/dL).   Medical History: Past Medical History:  Diagnosis Date   ALLERGIC RHINITIS    Anginal pain ([place])    Blood transfusion without reported diagnosis    Cataract    small right   Cholelithiasis 06/09/2008   gallstones 2009 on ct scan   Complication of anesthesia 2012   went to ICU after bilateral knees , unstable vital signs oct 2012, has surgery since did ok   Coronary artery disease    30 % narrowing lad   Diverticulitis    EPICONDYLITIS, LATERAL    osteoarthritis, previous joint replacements   Fall 10/2018   GERD    Hepatic hemangioma 06/09/08   Hiatal hernia 03/2000   Hx of adenomatous polyp of colon 06/05/08   Hx of cardiac catheterization 2008    clean coronarys DrKelly    Hx of chest pain 2003   neg cath remot hx of narrowwing lad in 2003 nl 2008, none in many years   HYPERLIPIDEMIA    IBS (irritable bowel syndrome)    LEUKOPENIA, CHRONIC    Mononucleosis 1963   RAYNAUD'S SYNDROME, HX OF    improved after cardiac meds initiated   Rosacea    facial   Sleep apnea    SLEEP APNEA, OBSTRUCTIVE    cpap, settings "automatic" settings 11-12   Subacute thyroiditis    UNSPECIFIED ANEMIA    Assessment: 76 y/o F admitted with bowel perforation s/p colectomy.  Pharmacy consulted to transition the patient from subcutaneoius  Wt. 113 kg BMI > 40  Plan:  Will transition patient to Lovenox 0.5 mg/kg (60 mg) daily. Will sign off and follow remotely. Thank you for the consult.   Ulice Dash D 01/10/2022,12:55 PM

## 2022-01-11 LAB — CBC
HCT: 30.8 % — ABNORMAL LOW (ref 36.0–46.0)
Hemoglobin: 10 g/dL — ABNORMAL LOW (ref 12.0–15.0)
MCH: 30.6 pg (ref 26.0–34.0)
MCHC: 32.5 g/dL (ref 30.0–36.0)
MCV: 94.2 fL (ref 80.0–100.0)
Platelets: 278 10*3/uL (ref 150–400)
RBC: 3.27 MIL/uL — ABNORMAL LOW (ref 3.87–5.11)
RDW: 14.1 % (ref 11.5–15.5)
WBC: 9.3 10*3/uL (ref 4.0–10.5)
nRBC: 0 % (ref 0.0–0.2)

## 2022-01-11 LAB — BASIC METABOLIC PANEL
Anion gap: 7 (ref 5–15)
BUN: 15 mg/dL (ref 8–23)
CO2: 24 mmol/L (ref 22–32)
Calcium: 8.2 mg/dL — ABNORMAL LOW (ref 8.9–10.3)
Chloride: 105 mmol/L (ref 98–111)
Creatinine, Ser: 0.55 mg/dL (ref 0.44–1.00)
GFR, Estimated: >60 mL/min (ref >60)
Glucose, Bld: 88 mg/dL (ref 70–99)
Potassium: 3.9 mmol/L (ref 3.5–5.1)
Sodium: 136 mmol/L (ref 135–145)

## 2022-01-11 LAB — MAGNESIUM: Magnesium: 1.7 mg/dL (ref 1.7–2.4)

## 2022-01-11 LAB — RESP PANEL BY RT-PCR (FLU A&B, COVID) ARPGX2
Influenza A by PCR: NEGATIVE
Influenza B by PCR: NEGATIVE
SARS Coronavirus 2 by RT PCR: NEGATIVE

## 2022-01-11 NOTE — Progress Notes (Signed)
8 Days Post-Op  Subjective: CC: Patient in good spirits today.  She wants to get out of the hospital.  She is A&O x 4. She had teaching with [facility] yesterday, along with her husband and daughter. She is tolerating soft diet and eating ~50% of meals. She does not like ensure or boost. She reports no abdominal pain this morning.  Has not required prn pain medication yesterday or this morning.  Nausea has resolved.  Has not required any antinausea medication since before I saw her yesterday morning.  Having ostomy output. Voiding. OOB to chair yesterday with PT. Family at bedside.   Objective: Vital signs in last 24 hours: Temp:  [98.3 F (36.8 C)-98.7 F (37.1 C)] 98.3 F (36.8 C) (02/07 0544) Pulse Rate:  [77-85] 77 (02/07 0544) Resp:  [18-20] 20 (02/07 0544) BP: (134-143)/(60-67) 143/64 (02/07 0544) SpO2:  [92 %-100 %] 92 % (02/07 0544) Last BM Date: 01/10/22  Intake/Output from previous day: 02/06 0701 - 02/07 0700 In: 360 [P.O.:360] Out: 425 [Stool:425] Intake/Output this shift: No intake/output data recorded.  PE: Gen:  Alert, NAD, pleasant Pulm: normal rate and effort  Abd: Soft, appropriately ttp, mildly distended, stoma beefy red, edematous and overall stable. Semi formed stool and air in ostomy bag. Midline incision with staples present. There is some localized redness at sites of staples that does not spread and appears reactive - no signs of cellulitis. Penrose drain in inferior incision with some SS appearing fluid on gauze. Psych: A&O x 4 Msk: No LE edema  Lab Results:  Recent Labs    01/09/22 0355 01/11/22 0407  WBC 11.3* 9.3  HGB 11.2* 10.0*  HCT 36.9 30.8*  PLT 278 278   BMET Recent Labs    01/09/22 0355 01/11/22 0407  NA 137 136  K 4.2 3.9  CL 103 105  CO2 19* 24  GLUCOSE 84 88  BUN 18 15  CREATININE 0.68 0.55  CALCIUM 8.4* 8.2*   PT/INR No results for input(s): LABPROT, INR in the last 72 hours. CMP     Component Value Date/Time   NA 136  01/11/2022 0407   NA 142 06/17/2019 1109   K 3.9 01/11/2022 0407   CL 105 01/11/2022 0407   CO2 24 01/11/2022 0407   GLUCOSE 88 01/11/2022 0407   GLUCOSE 88 11/08/2006 1015   BUN 15 01/11/2022 0407   BUN 22 06/17/2019 1109   CREATININE 0.55 01/11/2022 0407   CREATININE 0.70 10/15/2020 0922   CALCIUM 8.2 (L) 01/11/2022 0407   PROT 5.2 (L) 01/04/2022 0245   PROT 6.4 06/17/2019 1109   ALBUMIN 2.4 (L) 01/04/2022 0245   ALBUMIN 4.5 06/17/2019 1109   AST 31 01/04/2022 0245   ALT 21 01/04/2022 0245   ALKPHOS 86 01/04/2022 0245   BILITOT 1.0 01/04/2022 0245   BILITOT 0.3 06/17/2019 1109   GFRNONAA >60 01/11/2022 0407   GFRNONAA 85 10/15/2020 0922   GFRAA 98 10/15/2020 0922   Lipase  No results found for: LIPASE  Studies/Results: No results found.  Anti-infectives: Anti-infectives (From admission, onward)    Start     Dose/Rate Route Frequency Ordered Stop   01/03/22 0400  piperacillin-tazobactam (ZOSYN) IVPB 3.375 g        3.375 g 12.5 mL/hr over 240 Minutes Intravenous Every 8 hours 01/02/22 2337 01/08/22 0100   01/02/22 1930  piperacillin-tazobactam (ZOSYN) IVPB 3.375 g        3.375 g 100 mL/hr over 30 Minutes Intravenous  Once  01/02/22 1922 01/02/22 2134        Assessment/Plan POD 8 s/p exploratory laparotomy with left colectomy and transverse colostomy for perforated splenic flexure of colon due to ischemia or diverticular disease on 01/03/22 by Dr. Thermon Leyland - Shock improved - septic shock resolved s/p source control. Now off pressors, HD stable and  out of the unit - Post-op delirium improved. Now A&O x 4  - Cont multimodal pain control, maximize non-narcotic meds - Having ostomy output and tolerating diet. Nausea improved.   - WOC care appreciated. Daughter and son present for teaching 2/6. Will also refer to the ostomy clinic. - Completed post op abx x 5 days - Change inferior dressing at least BID but prn for saturation; remove Penrose prior to discharge -  Path without evidence of malignancy. Segment of colon noted with perforation, ischemic changes and acute serositis  - Pulm toilet - Continue to mobilize. PT rec SNF. Can start working towards this from our standpoint. Will let TR[facility] know.    FEN: Soft diet, ensure, SLIV VTE: SCDs. Lovenox  ID: Zosyn 1/29>>2/4. WBC wnl. Afebrile  Foley: removed 2/1, voiding; Minimize purewick to encourage mobilization - try to get to bedside commode to void as much as possible.    QTc 488 on 2/1 - Repeat EKG pending. TRH has d/c'd seroquel. On Zofran q6hrs PRN. Compazine for breakthrough n/v.  Recent UTI OSA AKI - Resolved   LOS: 9 days  Post op care  Jillyn Ledger , [facility] 01/11/2022, 8:38 AM Please see Amion for pager number during day hours 7:00am-4:30pm

## 2022-01-11 NOTE — Progress Notes (Signed)
Occupational Therapy Treatment Patient Details Name: Terri Rodriguez MRN: [id number] DOB: 1946-07-08 Today's Date: 01/11/2022   History of present illness Patient admitted for bowel perforation and septic shock  Patient now S/P exploratory laparotomy with left colectomy and transverse colostomy 01/03/22  and now presents with Post-op delirium.   OT comments  Patient was educated on how to use AE for LB dressing tasks on EOB on this date. Patient was mod A to complete task with motivation to participate noted. Patients session was limited by patients visitor at this time. Patient's discharge plan remains appropriate at this time. OT will continue to follow acutely.     Recommendations for follow up therapy are one component of a multi-disciplinary discharge planning process, led by the attending physician.  Recommendations may be updated based on patient status, additional functional criteria and insurance authorization.    Follow Up Recommendations  Skilled nursing-short term rehab (<3 hours/day)    Assistance Recommended at Discharge Frequent or constant Supervision/Assistance  Patient can return home with the following  A lot of help with bathing/dressing/bathroom;Assistance with cooking/housework;Help with stairs or ramp for entrance;Direct supervision/assist for medications management;Two people to help with walking and/or transfers   Equipment Recommendations  None recommended by OT    Recommendations for Other Services      Precautions / Restrictions Precautions Precautions: Fall Precaution Comments: L colostomy, abd penrose drain Restrictions Weight Bearing Restrictions: No       Mobility Bed Mobility Overal bed mobility: Needs Assistance Bed Mobility: Supine to Sit   Sidelying to sit: Mod assist, HOB elevated     Sit to sidelying: Max assist General bed mobility comments: Assist for trunk and LEs. Increased time. HOB maximally elevated. Cues for safety, technique, Pt  used bedrail to assist.    Transfers                         Balance Overall balance assessment: Needs assistance, History of Falls Sitting-balance support: No upper extremity supported, Feet supported Sitting balance-Leahy Scale: Fair                                     ADL either performed or assessed with clinical judgement   ADL Overall ADL's : Needs assistance/impaired                     Lower Body Dressing: Moderate assistance;Sitting/lateral leans Lower Body Dressing Details (indicate cue type and reason): mod A for AE to don socks sitting on edge of bed. patient was able to doff socks with min A with increased education and consistent cues.               General ADL Comments: patient was able to complete scooting to [facility] to improve positioning in bed with min A and cues    Extremity/Trunk Assessment              Vision       Perception     Praxis      Cognition Arousal/Alertness: Awake/alert Behavior During Therapy: WFL for tasks assessed/performed Overall Cognitive Status: Within Functional Limits for tasks assessed                                          Exercises  Shoulder Instructions       General Comments      Pertinent Vitals/ Pain       Pain Assessment Pain Assessment: Faces Faces Pain Scale: Hurts little more Pain Location: abdominal incision Pain Descriptors / Indicators: Discomfort, Sore Pain Intervention(s): Monitored during session  Home Living                                          Prior Functioning/Environment              Frequency  Min 2X/week        Progress Toward Goals  OT Goals(current goals can now be found in the care plan section)  Progress towards OT goals: Progressing toward goals     Plan Discharge plan remains appropriate    Co-evaluation                 AM-PAC OT "6 Clicks" Daily Activity     Outcome  Measure   Help from another person eating meals?: Total Help from another person taking care of personal grooming?: A Lot Help from another person toileting, which includes using toliet, bedpan, or urinal?: Total Help from another person bathing (including washing, rinsing, drying)?: A Lot Help from another person to put on and taking off regular upper body clothing?: A Lot Help from another person to put on and taking off regular lower body clothing?: Total 6 Click Score: 9    End of Session    OT Visit Diagnosis: Other symptoms and signs involving cognitive function;Pain   Activity Tolerance Patient limited by pain   Patient Left in bed;with call bell/phone within reach;with chair alarm set;with family/visitor present   Nurse Communication Mobility status        Time: 4854-6270 OT Time Calculation (min): 28 min  Charges: OT General Charges $OT Visit: 1 Visit OT Treatments $Self Care/Home Management : 23-37 mins  Jackelyn Poling OTR/L, MS Acute Rehabilitation Department Office# [phone] Pager# [phone]   Marcellina Millin 01/11/2022, 4:50 PM

## 2022-01-11 NOTE — Discharge Instructions (Addendum)
[facility]      [facility] °[phone] ° °OPEN ABDOMINAL SURGERY: POST OP INSTRUCTIONS ° °Always review your discharge instruction sheet given to you by the facility where your surgery was performed. ° °IF YOU HAVE DISABILITY OR FAMILY LEAVE FORMS, YOU MUST BRING THEM TO THE OFFICE FOR PROCESSING.  PLEASE DO NOT GIVE THEM TO YOUR DOCTOR. ° °A prescription for pain medication may be given to you upon discharge.  Take your pain medication as prescribed, if needed.  If narcotic pain medicine is not needed, then you may take acetaminophen (Tylenol) or ibuprofen (Advil) as needed. °Take your usually prescribed medications unless otherwise directed. °If you need a refill on your pain medication, please contact your pharmacy. They will contact our office to request authorization.  Prescriptions will not be filled after 5pm or on week-ends. °You should follow a light diet the first few days after arrival home, such as soup and crackers, pudding, etc.unless your doctor has advised otherwise. A high-fiber, low fat diet can be resumed as tolerated.   Be sure to include lots of fluids daily. Most patients will experience some swelling and bruising on the chest and neck area.  Ice packs will help.  Swelling and bruising can take several days to resolve °Most patients will experience some swelling and bruising in the area of the incision. Ice pack will help. Swelling and bruising can take several days to resolve..  °It is common to experience some constipation if taking pain medication after surgery.  Increasing fluid intake and taking a stool softener will usually help or prevent this problem from occurring.  A mild laxative (Milk of Magnesia or Miralax) should be taken according to package directions if there are no bowel movements after 48 hours. ° You may have steri-strips (small skin tapes) in place directly over the incision.  These strips should be left on the skin for 7-10 days.  If your surgeon used skin  glue on the incision, you may shower in 24 hours.  The glue will flake off over the next 2-3 weeks.  Any sutures or staples will be removed at the office during your follow-up visit. You may find that a light gauze bandage over your incision may keep your staples from being rubbed or pulled. You may shower and replace the bandage daily. °ACTIVITIES:  You may resume regular (light) daily activities beginning the next day--such as daily self-care, walking, climbing stairs--gradually increasing activities as tolerated.  You may have sexual intercourse when it is comfortable.  Refrain from any heavy lifting or straining until approved by your doctor. °You may drive when you no longer are taking prescription pain medication, you can comfortably wear a seatbelt, and you can safely maneuver your car and apply brakes ° °You should see your doctor in the office for a follow-up appointment approximately two weeks after your surgery.  Make sure that you call for this appointment within a day or two after you arrive home to insure a convenient appointment time. ° °WHEN TO CALL YOUR DOCTOR: °Fever over 101.0 °Inability to urinate °Nausea and/or vomiting °Extreme swelling or bruising °Continued bleeding from incision. °Increased pain, redness, or drainage from the incision. °Difficulty swallowing or breathing °Muscle cramping or spasms. °Numbness or tingling in hands or feet or around lips. ° °The clinic staff is available to answer your questions during regular business hours.  Please don’t hesitate to call and ask to speak to one of the nurses if you have concerns. ° °For   further questions, please visit [email or web address]  °

## 2022-01-11 NOTE — Progress Notes (Signed)
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 She had been attempting to have a bowel cleansing at home with a trial of MiraLAX starting on 12/28/2021 with little success.  She ultimately presented on 01/02/2022 with ongoing difficulty having bowel movement and ensuing hypotension. CT abdomen/pelvis performed revealed free air concerning for underlying bowel perforation with site on visualized but presumed colonic.  She was evaluated by general surgery and underwent urgent ex lap which showed perforated splenic flexure diverticulitis and she was treated with colon resection and placement of colostomy.  Surgery performed on 01/03/2022.  Assessment and Plan: Perforation of colon s/p Hartmann colectomy/colostomy 01/03/2022- (present on admission) - Patient presented with hypotension and difficulty with bowel movements at home and having failed laxative trial; CT abdomen/pelvis showed perforated viscus.  Ex lap performed on 01/03/2022 showed perforated splenic flexure diverticulitis; treated with resection and colostomy placement - ostomy bag now noted with stool consistently since 01/05/22 -Penrose drain to be removed prior to discharge - remove staples on 01/17/22 if still at rehab - NGT removed 2/1; diet advanced further to soft per surgery -Now that she is awake and alert, needs ostomy teaching  Diverticulitis-resolved as of 01/11/2022 - see perforated colon as well - s/p ostomy now in place -Completed 5-day course of Zosyn on 01/07/2022   Elevated blood pressure reading-resolved as of 01/09/2022 - treat pain as needed - using labetalol or hydralazine PRN  also  Delirium due to multiple etiologies, acute, hyperactive-resolved as of 01/09/2022 - patient has known history of delirium in the past after surgery. Suspect this is multifactorial in setting of acute infection s/p perforated colon with sedation/anesthesia as well as pain component and also some ICU delirium -Mentation has actually continued to improve over the past couple days.  She is now on progressive unit and remains awake, alert, and cooperative. - continue treating pain as necessary   Physical deconditioning - Some expected deconditioning in setting of infection, abdominal surgery, and anticipated prolonged hospitalization/recovery -PT/OT working with patient - Accepted to [facility], tentative d/c 01/12/22 after more ostomy education that morning  IBS (irritable bowel syndrome) - likely constipation predominant; did not tolerate/respond to miralax trial prior to admission - continue monitoring ostomy output and will need ongoing bowel regimen at discharge   Obstructive sleep apnea- (present on admission) - continue nightly CPAP  Normocytic anemia - baseline approx 12g/dL - some downtrend not unexpected in setting of recent surgery and possibly some hemodilution; no overt bleeding appreciated - continue trending Hgb daily  -Hemoglobin has continued to recover and is back to normal range as of 01/08/2022 (12.5 g/dL)  Sepsis (HCC)-resolved as of 01/05/2022 - leukocytosis, tachycardia, source considered bowel perforation - continue abx - s/p surgical repair with ex lap and resection with colostomy placement on 01/03/22 - completed 5 days abx  Hypovolemic shock (HCC)-resolved as of 01/05/2022 - presented with syncope and hypovolemia; started on Levophed in ICU which was weaned off on 01/03/22     Subjective: Doing well this morning.  Husband present bedside.  She is very comfortable and sitting in recliner.  Overall she is significantly improved. She was excepted to [facility] with  tentative plans for discharge tomorrow.  Physical Exam: Vitals:   01/10/22 2029 01/10/22 2113 01/11/22 0544 01/11/22 1140  BP: 134/60  (!) 143/64 (!) 149/68  Pulse: 85  77 81  Resp: 20 18 20  (!) 22  Temp: 98.7 F (37.1 C)  98.3 F (36.8 C)   TempSrc: Oral  Oral   SpO2: 92%  92% 99%  Weight:      Height:       Physical Exam Vitals reviewed.  Constitutional:      Comments: Pleasant elderly woman sitting up in recliner comfortable and no distress  HENT:     Head: Normocephalic and atraumatic.     Mouth/Throat:     Mouth: Mucous membranes are moist.  Eyes:     Extraocular Movements: Extraocular movements intact.  Cardiovascular:     Rate and Rhythm: Normal rate and regular rhythm.  Pulmonary:     Effort: Pulmonary effort is normal.     Breath sounds: Normal breath sounds.  Abdominal:     Palpations: Abdomen is soft.     Comments: Ostomy bag in place noted with large amounts of stool. Stoma large but healthy appearing. BS present   Musculoskeletal:        General: Normal range of motion.     Cervical back: Normal range of motion and neck supple.  Skin:    General: Skin is dry.  Neurological:     General: No focal deficit present.     Data Reviewed:  I have Reviewed nursing notes, Vitals, and Lab results since pt's last encounter. Pertinent lab results Hgb 9.3, WBC 10 I have ordered test including CBC, Mg, BMP I have reviewed the last note from staff over past 24 hours,  I have discussed pt's care plan and test results with nursing staff.   Family Communication: husband  Disposition: Status is: Inpatient Remains inpatient appropriate because: Ongoing treatment for perforated bowel s/p resection and colostomy placement     Planned Discharge Destination: Skilled nursing facility. Tentative d/c on 2/8 DVT ppx: HSQ     Author: Dwyane Dee, MD 01/11/2022 1:44 PM  For on call review [email or web address].

## 2022-01-11 NOTE — Plan of Care (Signed)
  Problem: Health Behavior/Discharge Planning: Goal: Ability to manage health-related needs will improve Outcome: Progressing   Problem: Clinical Measurements: Goal: Ability to maintain clinical measurements within normal limits will improve Outcome: Progressing   Problem: Clinical Measurements: Goal: Will remain free from infection Outcome: Progressing   

## 2022-01-12 DIAGNOSIS — R1010 Upper abdominal pain, unspecified: Secondary | ICD-10-CM

## 2022-01-12 NOTE — Consult Note (Addendum)
[facility] Nurse ostomy follow up Stoma type/location:  Pt had colostomy surgery performed 1/30. Stomal assessment/size: Stoma is edematous, but slowly decreasing in size: 2 1/4 inches, moderately prolapsed and needs to be lifted and inserted into the pouch opening for a proper fit.   Emptied 100 cc liquid brown stool.  Encouraged her to practice emptying the pouch when she is able to walk into the bathroom.  Peristomal skin: Red moist partial thickness excoriation underneath the stoma, where is is heavy and laying against the skin.  Applied barrier ring around the edges to attempt to protect the location. Education provided: Pt was able to assist with barrier ring sizing and placement of one piece pouch.  She was able to open and close velcro to empty, but stated she feels more secure using an ostomy clamp.  She was able to open and close the clamp to empty. Husband at the bedside during the teaching session. Applied 1 piece pouch and barrier ring, since patient feels a one piece will be easier to apply.  5 sets of supplies left at the bedside; one piece pouch, Lawson # [id number] and barrier ring Kellie Simmering # [id number].  Educational materials in the room.  Reviewed pouching routines and ordering supplies.  Pt plans to discharge to a SNF.  Enrolled patient in [facility] Discharge program: Yes, previously. Julien Girt MSN, RN, [place], [place], [place]

## 2022-01-12 NOTE — Plan of Care (Signed)

## 2022-01-12 NOTE — Progress Notes (Signed)
[facility]                                                                               Terri Rodriguez, is a 76 y.o. 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 She had been attempting to have a bowel cleansing at home with a trial of MiraLAX starting on 12/28/2021 with little success.  She ultimately presented on 01/02/2022 with ongoing difficulty having bowel movement and ensuing hypotension. CT abdomen/pelvis performed revealed free air concerning for underlying bowel perforation with site on visualized but presumed colonic.  She was evaluated by general surgery and underwent urgent ex lap which showed perforated splenic flexure diverticulitis and she was treated with colon resection and placement of colostomy.  Surgery performed on 01/03/2022.   Assessment & Plan    Assessment and Plan: Physical deconditioning- (present on admission) - PT OT evaluation initially recommended SNF.  However patient now wants to go home    IBS (irritable bowel syndrome) - likely constipation predominant; did not tolerate/respond to miralax trial prior to admission - continue monitoring ostomy output, will need bowel regimen at discharge  Perforation of colon s/p Hartmann colectomy/colostomy 01/03/2022- (present on admission) - presented with hypotension and difficulty with bowel movements at home and having failed laxative trial - CT abdomen/pelvis showed perforated viscus.  Ex lap performed on 01/03/2022 showed perforated splenic flexure diverticulitis; treated with resection and colostomy placement - ostomy bag now noted with stool consistently since  01/05/22, ostomy teaching per wound nurse -Tolerating diet, completed postop antibiotics x 5 days -Surgery recommendations reviewed, removed Penrose prior to discharge, change inferior dressing at least twice daily but as needed for saturation. -Remove staples on 2/13   Obstructive sleep apnea- (present on admission) - continue CPAP qhs  Normocytic anemia- (present on admission) - baseline approx 12g/dL -Mild downtrend likely due to recent surgery, no overt bleeding -H&H stable, continue to monitor  Sepsis (HCC)-resolved as of 01/05/2022 - leukocytosis, tachycardia, source considered bowel perforation - s/p surgical repair with ex lap and resection with colostomy placement on 01/03/22 - completed 5 days abx  Hypovolemic shock (HCC)-resolved as of 01/05/2022 - presented with syncope and hypovolemia; started on Levophed in ICU which was weaned off on 01/03/22  Elevated blood pressure reading-resolved as of 01/09/2022 - treat pain as needed - using labetalol or hydralazine PRN also  Diverticulitis-resolved as of 01/11/2022 - see perforated colon as well - s/p ostomy now in place -Completed 5-day course of Zosyn on 01/07/2022   Delirium due to multiple etiologies, acute, hyperactive-resolved as of 01/09/2022 - patient has known history of delirium in the past after surgery.  - Suspect this is multifactorial in setting of acute infection s/p perforated colon with sedation/anesthesia, pain component and ICU delirium -Currently alert and oriented x3, no acute issues  Code Status: Full CODE STATUS DVT Prophylaxis:  SCDs Start: 01/02/22 2140   Level of Care: Level of care: Progressive Family Communication:   Disposition Plan:     Remains inpatient appropriate: Awaiting skilled nursing facility  Procedures:    Consultants:   General surgery  Antimicrobials:   Anti-infectives (From admission, onward)    Start     Dose/Rate Route Frequency Ordered Stop   01/03/22 0400   piperacillin-tazobactam (ZOSYN) IVPB 3.375 g        3.375 g 12.5 mL/hr over 240 Minutes Intravenous Every 8 hours 01/02/22 2337 01/08/22 0100   01/02/22 1930  piperacillin-tazobactam (ZOSYN) IVPB 3.375 g        3.375 g 100 mL/hr over 30 Minutes Intravenous  Once 01/02/22 1922 01/02/22 2134        Medications  Scheduled Meds:  enoxaparin (LOVENOX) injection  60 mg Subcutaneous Daily   feeding supplement  237 mL Oral TID BM   mouth rinse  15 mL Mouth Rinse BID   pantoprazole  40 mg Oral QHS   Continuous Infusions:  sodium chloride Stopped (01/02/22 2355)   PRN Meds:.acetaminophen, hydrALAZINE, ipratropium-albuterol, labetalol, lip balm, methocarbamol, morphine injection, ondansetron (ZOFRAN) IV, oxyCODONE    Subjective:   [name] was seen and examined today.  Alert and oriented x3, eager to go home if possible.  No acute abdominal pain, ostomy working well.  No acute overnight issues.  No fevers.    Objective:   Vitals:   01/11/22 1140 01/11/22 2115 01/12/22 0609 01/12/22 1410  BP: (!) 149/68 (!) 152/51 (!) 138/56 (!) 158/67  Pulse: 81 77 76 82  Resp: (!) 22   (!) 21  Temp:  98.5 F (36.9 C) 97.9 F (36.6 C) 98.4 F (36.9 C)  TempSrc:  Oral Oral Oral  SpO2: 99% 95% 97% 97%  Weight:   112.4 kg   Height:        Intake/Output Summary (Last 24 hours) at 01/12/2022 1608 Last data filed at 01/12/2022 1000 Gross per 24 hour  Intake 240 ml  Output 100 ml  Net 140 ml   Filed Weights   01/09/22 0437 01/10/22 0500 01/12/22 0609  Weight: 113.7 kg 113.1 kg 112.4 kg     Exam General: Alert and oriented x 3, NAD Cardiovascular: S1 S2 auscultated, no murmurs, RRR Respiratory: Clear to auscultation bilaterally, no wheezing, rales or rhonchi Gastrointestinal: Soft, ostomy bag with moderate amount of stool.  Stoma large but healthy appearing.   Ext: no pedal edema bilaterally Neuro: no new deficits    Data Reviewed:  I have personally reviewed following labs and  imaging studies   CBC Lab Results  Component Value Date   WBC 9.3 01/11/2022   RBC 3.27 (L) 01/11/2022   HGB 10.0 (L) 01/11/2022   HCT 30.8 (L) 01/11/2022   MCV 94.2 01/11/2022   MCH 30.6 01/11/2022   PLT 278 01/11/2022   MCHC 32.5 01/11/2022   RDW 14.1 01/11/2022   LYMPHSABS 0.8 01/02/2022   MONOABS 1.1 (H) 01/02/2022   EOSABS 0.1 01/02/2022   BASOSABS 0.1 76/72/0947     Last metabolic panel Lab Results  Component Value Date   NA 136 01/11/2022   K 3.9 01/11/2022   CL 105 01/11/2022   CO2 24 01/11/2022   BUN 15 01/11/2022   CREATININE 0.55 01/11/2022   GLUCOSE 88 01/11/2022   GFRNONAA >60 01/11/2022   GFRAA 98 10/15/2020   CALCIUM 8.2 (L) 01/11/2022   PHOS 3.6 01/09/2022  PROT 5.2 (L) 01/04/2022   ALBUMIN 2.4 (L) 01/04/2022   LABGLOB 1.9 06/17/2019   AGRATIO 2.4 (H) 06/17/2019   BILITOT 1.0 01/04/2022   ALKPHOS 86 01/04/2022   AST 31 01/04/2022   ALT 21 01/04/2022   ANIONGAP 7 01/11/2022      Claudeen Leason M.D. [facility] 01/12/2022, 4:08 PM  Available via Epic secure chat 7am-7pm After 7 pm, please refer to night coverage provider listed on amion.

## 2022-01-12 NOTE — Progress Notes (Signed)
Physical Therapy Treatment Patient Details Name: Terri Rodriguez MRN: [id number] DOB: Jan 14, 1946 Today's Date: 01/12/2022   History of Present Illness Patient admitted on 01/02/22 for bowel perforation and septic shock  Patient now S/P exploratory laparotomy with left colectomy and transverse colostomy 01/03/22  and  with Post-op delirium (gradually improving). Pt with hx including but not limited to chronic constipation, IBS, OA, HLD, hepatic hemangioma, leukopenia    PT Comments    Pt making good progress but does still need min guard-min A and min-mod cues for safety and redirecting to task at hand.  She was able to follow commands and discuss medical/home situation but needs cues for safety at times and demonstrated some short term memory deficits/easily distracted.    Had received message from PA regarding reevaluating pt for home vs SNF as family seemed interested in taking pt home.  Spoke with spouse on phone who reports will take her home if possible ; however, their bedroom is upstairs and he is working on Statistic[employer] but does not have movers coming until weekend or later.  Pt is not ready or able to attempt a flight of steps at this time.  If home - pt needs near 24 hr light assist/supervision and bedroom/sleeping situation on first floor.     Recommendations for follow up therapy are one component of a multi-disciplinary discharge planning process, led by the attending physician.  Recommendations may be updated based on patient status, additional functional criteria and insurance authorization.  Follow Up Recommendations  Skilled nursing-short term rehab (<3 hours/day) (SNF vs home with HHPT pending progression and family getting bedroom set up on first floor)     Assistance Recommended at Discharge Frequent or constant Supervision/Assistance  Patient can return home with the following A little help with walking and/or transfers;A little help with bathing/dressing/bathroom;Help with  stairs or ramp for entrance;Assistance with cooking/housework;Direct supervision/assist for financial management;Assist for transportation   Equipment Recommendations  None recommended by PT    Recommendations for Other Services       Precautions / Restrictions Precautions Precautions: Fall Precaution Comments: L colostomy, adominal incision     Mobility  Bed Mobility Overal bed mobility: Needs Assistance Bed Mobility: Supine to Sit     Supine to sit: Supervision, HOB elevated     General bed mobility comments: Use of rail and increased time but no physical assist; min cues to scoot forward    Transfers Overall transfer level: Needs assistance Equipment used: Rolling walker (2 wheels) Transfers: Sit to/from Stand Sit to Stand: Min guard           General transfer comment: Increased time with cues for hand placement    Ambulation/Gait Ambulation/Gait assistance: Min assist Gait Distance (Feet): 45 Feet Assistive device: Rolling walker (2 wheels) Gait Pattern/deviations: Step-to pattern, Decreased stride length Gait velocity: decr     General Gait Details: Chair follow; min reports of dizziness but tolerated; slow gait with min A to steady and assist with RW for turns; fatigued easily with 3 standing rest breaks   Stairs             Wheelchair Mobility    Modified Rankin (Stroke Patients Only)       Balance Overall balance assessment: Needs assistance, History of Falls Sitting-balance support: No upper extremity supported, Feet supported Sitting balance-Leahy Scale: Good     Standing balance support: Bilateral upper extremity supported, Reliant on assistive device for balance, Single extremity supported Standing balance-Leahy Scale: Poor Standing balance  comment: Using RW or single UE support on counter                            Cognition Arousal/Alertness: Awake/alert Behavior During Therapy: WFL for tasks  assessed/performed Overall Cognitive Status: Impaired/Different from baseline Area of Impairment: Memory                     Memory: Decreased short-term memory         General Comments: Pt also tangential and needs cues focusing on task at hand at times - pt perserverating on recent conversation with daughter        Exercises Other Exercises Other Exercises: sit to stand x 5 with increased time, min guard and cues    General Comments General comments (skin integrity, edema, etc.): VSS.  Pt reports that daughter expressed concerned about her going home and ability to care for self. PT spoke with spouse on phone who confirmed they have w/c's , walkers, bsc and necessary DME.  His biggest concern is that bedroom is upstairs - he has movers coming to move furniture but not until this weekend or Monday.      Pertinent Vitals/Pain Pain Assessment Pain Assessment: No/denies pain    Home Living                          Prior Function            PT Goals (current goals can now be found in the care plan section) Progress towards PT goals: Progressing toward goals    Frequency    Min 3X/week      PT Plan Discharge plan needs to be updated    Co-evaluation              AM-PAC PT "6 Clicks" Mobility   Outcome Measure  Help needed turning from your back to your side while in a flat bed without using bedrails?: A Little Help needed moving from lying on your back to sitting on the side of a flat bed without using bedrails?: A Little Help needed moving to and from a bed to a chair (including a wheelchair)?: A Little Help needed standing up from a chair using your arms (e.g., wheelchair or bedside chair)?: A Little Help needed to walk in hospital room?: A Little Help needed climbing 3-5 steps with a railing? : A Lot 6 Click Score: 17    End of Session Equipment Utilized During Treatment: Gait belt Activity Tolerance: Patient tolerated treatment  well Patient left: in chair;with call bell/phone within reach;with chair alarm set Nurse Communication: Mobility status PT Visit Diagnosis: Muscle weakness (generalized) (M62.81);Difficulty in walking, not elsewhere classified (R26.2)     Time: 1448-1856 PT Time Calculation (min) (ACUTE ONLY): 40 min  Charges:  $Gait Training: 8-22 mins $Therapeutic Exercise: 8-22 mins $Therapeutic Activity: 8-22 mins                     Abran Richard, PT Acute Rehab Services Pager [phone] [facility] 01/12/2022, 3:46 PM

## 2022-01-12 NOTE — Progress Notes (Signed)
9 Days Post-Op  Subjective: CC: Doing well. Reports soreness around her incision when mobilizing or moving in bed. Otherwise not having much abdominal pain. Appetite has not returned to baseline but tolerating diet without n/v. Having ostomy output. RN reports wocn to come by at 72 today for more ostomy teaching. Burping her bag and has emptied x1 at least. Voiding without difficulty. Worked with OT yesterday. Awaiting SNF bed. No PRN pain or nausea medications needed yesterday.   Objective: Vital signs in last 24 hours: Temp:  [97.9 F (36.6 C)-98.5 F (36.9 C)] 97.9 F (36.6 C) (02/08 0609) Pulse Rate:  [76-81] 76 (02/08 0609) Resp:  [22] 22 (02/07 1140) BP: (138-152)/(51-68) 138/56 (02/08 0609) SpO2:  [95 %-99 %] 97 % (02/08 0609) Weight:  [112.4 kg] 112.4 kg (02/08 0609) Last BM Date: 01/11/22  Intake/Output from previous day: 02/07 0701 - 02/08 0700 In: 240 [P.O.:240] Out: 75 [Stool:75] Intake/Output this shift: No intake/output data recorded.  PE: Gen:  Alert, NAD, pleasant Pulm: normal rate and effort  Abd: Soft, appropriately ttp, mildly distended, stoma beefy red, edematous and overall stable. Semi formed stool and air in ostomy bag. Midline incision with staples present. There is some localized redness at sites of staples that does not spread and appears reactive (stable from yesterday) - no signs of cellulitis. Penrose drain in inferior incision with some SS appearing fluid on gauze.  Lab Results:  Recent Labs    01/11/22 0407  WBC 9.3  HGB 10.0*  HCT 30.8*  PLT 278   BMET Recent Labs    01/11/22 0407  NA 136  K 3.9  CL 105  CO2 24  GLUCOSE 88  BUN 15  CREATININE 0.55  CALCIUM 8.2*   PT/INR No results for input(s): LABPROT, INR in the last 72 hours. CMP     Component Value Date/Time   NA 136 01/11/2022 0407   NA 142 06/17/2019 1109   K 3.9 01/11/2022 0407   CL 105 01/11/2022 0407   CO2 24 01/11/2022 0407   GLUCOSE 88 01/11/2022 0407    GLUCOSE 88 11/08/2006 1015   BUN 15 01/11/2022 0407   BUN 22 06/17/2019 1109   CREATININE 0.55 01/11/2022 0407   CREATININE 0.70 10/15/2020 0922   CALCIUM 8.2 (L) 01/11/2022 0407   PROT 5.2 (L) 01/04/2022 0245   PROT 6.4 06/17/2019 1109   ALBUMIN 2.4 (L) 01/04/2022 0245   ALBUMIN 4.5 06/17/2019 1109   AST 31 01/04/2022 0245   ALT 21 01/04/2022 0245   ALKPHOS 86 01/04/2022 0245   BILITOT 1.0 01/04/2022 0245   BILITOT 0.3 06/17/2019 1109   GFRNONAA >60 01/11/2022 0407   GFRNONAA 85 10/15/2020 0922   GFRAA 98 10/15/2020 0922   Lipase  No results found for: LIPASE  Studies/Results: No results found.  Anti-infectives: Anti-infectives (From admission, onward)    Start     Dose/Rate Route Frequency Ordered Stop   01/03/22 0400  piperacillin-tazobactam (ZOSYN) IVPB 3.375 g        3.375 g 12.5 mL/hr over 240 Minutes Intravenous Every 8 hours 01/02/22 2337 01/08/22 0100   01/02/22 1930  piperacillin-tazobactam (ZOSYN) IVPB 3.375 g        3.375 g 100 mL/hr over 30 Minutes Intravenous  Once 01/02/22 1922 01/02/22 2134        Assessment/Plan POD 9 s/p exploratory laparotomy with left colectomy and transverse colostomy for perforated splenic flexure of colon due to ischemia or diverticular disease on 01/03/22 by Dr.  Stechschulte - Tolerating diet and having bowel function - WOC care appreciated. Daughter and son present for teaching 2/6. Will also refer to the ostomy clinic. Supposed to have another teaching today per RN - Completed post op abx x 5 days - Change inferior dressing at least BID but prn for saturation; remove Penrose prior to discharge - Path without evidence of malignancy. Segment of colon noted with perforation, ischemic changes and acute serositis  - Pulm toilet - Continue to mobilize. PT  - Awaiting SNF   FEN: Soft diet, ensure, SLIV VTE: SCDs. Lovenox  ID: Zosyn 1/29>>2/4. WBC wnl 2/7. Afebrile  Foley: removed 2/1, voiding   QTc 488 on 2/1 - Repeat EKG  pending. Discussed with TRH. TRH has d/c'd seroquel. On Zofran q6hrs PRN but has not required over the last 24 hours.  Recent UTI OSA AKI - Resolved   LOS: 10 days   Post operative care  Jillyn Ledger , [facility] 01/12/2022, 8:40 AM Please see Amion for pager number during day hours 7:00am-4:30pm

## 2022-01-12 NOTE — TOC Progression Note (Signed)
Transition of Care [facility]) - Progression Note    Patient Details  Name: Terri Rodriguez MRN: [id number] Date of Birth: October 23, 1946  Transition of Care [facility]) CM/SW Contact  Leeroy Cha, RN Phone Number: 01/12/2022, 3:03 PM  Clinical Narrative:    Patient approved through Doreatha Martin dates [id number].number [id number] will accept patient depending on bed availably.  Expected Discharge Plan: [place] Barriers to Discharge: Continued Medical Work up  Expected Discharge Plan and Services Expected Discharge Plan: [place] In-house Referral: Clinical Social Work   Post Acute Care Choice: [place] Living arrangements for the past 2 months: Single Family Home                 DME Arranged: N/A DME Agency: NA                   Social Determinants of Health (SDOH) Interventions    Readmission Risk Interventions No flowsheet data found.

## 2022-01-13 NOTE — Care Management Important Message (Signed)
Important Message  Patient Details IM Letter placed in Patients room. Name: Terri Rodriguez MRN: [id number] Date of Birth: 1945-12-08   Medicare Important Message Given:  Yes     Kerin Salen 01/13/2022, 1:10 PM

## 2022-01-13 NOTE — TOC Progression Note (Signed)
Transition of Care [facility]) - Progression Note    Patient Details  Name: Terri Rodriguez MRN: [id number] Date of Birth: [birth year]  Transition of Care [facility]) CM/SW Contact  Leeroy Cha, RN Phone Number: 01/13/2022, 10:14 AM  Clinical Narrative:    Pt ot aide and rn requested through [facility]/stacie at 1013.   Expected Discharge Plan: Home/Self Care Barriers to Discharge: Barriers Resolved  Expected Discharge Plan and Services Expected Discharge Plan: Home/Self Care In-house Referral: Clinical Social Work   Post Acute Care Choice: [place] Living arrangements for the past 2 months: Single Family Home                 DME Arranged: N/A DME Agency: NA                   Social Determinants of Health (SDOH) Interventions    Readmission Risk Interventions No flowsheet data found.

## 2022-01-13 NOTE — TOC Progression Note (Signed)
Transition of Care [facility]) - Progression Note    Patient Details  Name: Terri Rodriguez MRN: [id number] Date of Birth: Jun 02, 1946  Transition of Care [facility]) CM/SW Contact  Leeroy Cha, RN Phone Number: 01/13/2022, 10:00 AM  Clinical Narrative:     Husband now wants to home with hhc.  Can provide 2/7 home care.  Expected Discharge Plan: Home/Self Care Barriers to Discharge: Barriers Resolved  Expected Discharge Plan and Services Expected Discharge Plan: Home/Self Care In-house Referral: Clinical Social Work   Post Acute Care Choice: [place] Living arrangements for the past 2 months: Single Family Home                 DME Arranged: N/A DME Agency: NA                   Social Determinants of Health (SDOH) Interventions    Readmission Risk Interventions No flowsheet data found.

## 2022-01-13 NOTE — Progress Notes (Signed)
Occupational Therapy Treatment Patient Details Name: Terri Rodriguez MRN: [id number] DOB: 1946/10/02 Today's Date: 01/13/2022   History of present illness Patient admitted on 01/02/22 for bowel perforation and septic shock  Patient now S/P exploratory laparotomy with left colectomy and transverse colostomy 01/03/22  and  with Post-op delirium (gradually improving). Pt with hx including but not limited to chronic constipation, IBS, OA, HLD, hepatic hemangioma, leukopenia   OT comments  Patient pleasant and cooperative, tangential at times needing cues to redirect. Patient also having some difficulties with remembering all steps for colostomy care and would benefit from another education session with wound RN. Overall patient was supervision level for bed mobility, ambulation with walker and transfer on/off toilet with min cues for safety with body mechanics. Patient was also able to perform figure 4 method semi-supine in bed to don socks at start of session. Discussed with patient possible sleeping arrangements at home and she states she has a reclining love seat that she could sleep on until bedroom furniture is moved downstairs. There is a bathroom close to her den where she would be sleeping, and also will be borrowing a 3N1 from her neighbor. Patient has progressed well with therapy and anticipate she can D/C home with [facility] services and family support.    Recommendations for follow up therapy are one component of a multi-disciplinary discharge planning process, led by the attending physician.  Recommendations may be updated based on patient status, additional functional criteria and insurance authorization.    Follow Up Recommendations  Home health OT    Assistance Recommended at Discharge Frequent or constant Supervision/Assistance  Patient can return home with the following  A little help with walking and/or transfers;A little help with bathing/dressing/bathroom;Assistance with cooking/housework;Help  with stairs or ramp for entrance;Assist for transportation   Equipment Recommendations  None recommended by OT       Precautions / Restrictions Precautions Precautions: Fall Precaution Comments: L colostomy, adominal incision Restrictions Weight Bearing Restrictions: No       Mobility Bed Mobility Overal bed mobility: Needs Assistance Bed Mobility: Rolling, Sidelying to Sit Rolling: Supervision Sidelying to sit: Supervision       General bed mobility comments: Increased time needed       Balance Overall balance assessment: Needs assistance, History of Falls Sitting-balance support: Feet supported Sitting balance-Leahy Scale: Good     Standing balance support: No upper extremity supported Standing balance-Leahy Scale: Fair Standing balance comment: While washing hands at sink no UE support                           ADL either performed or assessed with clinical judgement   ADL Overall ADL's : Needs assistance/impaired     Grooming: Wash/dry hands;Supervision/safety;Standing               Lower Body Dressing: Set up;Bed level Lower Body Dressing Details (indicate cue type and reason): using figure 4 method while semi-supine in bed patient is able to don socks without abdominal strain Toilet Transfer: Supervision/safety;Cueing for safety;Rolling walker (2 wheels);Ambulation;Regular Toilet;Grab bars Toilet Transfer Details (indicate cue type and reason): cues to push from surface vs pulling no walker. no physical assistance needed to stand         Functional mobility during ADLs: Supervision/safety;Rolling walker (2 wheels) General ADL Comments: Patient progressing well with self care tasks and functional transfers. Discuss with patient options for sleeping downstairs until her bedroom furniture can be moved to first  level. Patient states her neighbor is lending her a 3N1 to use. Bathroom from her den area where she can initially sleep is "Very  close" per patient      Cognition Arousal/Alertness: Awake/alert Behavior During Therapy: WFL for tasks assessed/performed Overall Cognitive Status: No family/caregiver present to determine baseline cognitive functioning                                 General Comments: Patient reporting difficulty remembering all steps to care for ostomy and asking for RN to come back for further training. Patient is very pleasant however tangential needing cues to redirect.                   Pertinent Vitals/ Pain       Pain Assessment Pain Assessment: Faces Faces Pain Scale: Hurts little more Pain Location: abdominal incision Pain Descriptors / Indicators: Discomfort, Sore Pain Intervention(s): Monitored during session         Frequency  Min 2X/week        Progress Toward Goals  OT Goals(current goals can now be found in the care plan section)  Progress towards OT goals: Progressing toward goals  Acute Rehab OT Goals OT Goal Formulation: With patient Time For Goal Achievement: 01/19/22 Potential to Achieve Goals: Good ADL Goals Pt Will Transfer to Toilet: with min assist;ambulating;regular height toilet;grab bars Pt Will Perform Toileting - Clothing Manipulation and hygiene: with mod assist;sit to/from stand Additional ADL Goal #1: Patient will stand at sink to perform grooming task as evidence of improving balance. Additional ADL Goal #2: Patient will perform 10 min functional activity or exercise activity as evidence of improving activity tolerance  Plan Discharge plan needs to be updated       AM-PAC OT "6 Clicks" Daily Activity     Outcome Measure   Help from another person eating meals?: None Help from another person taking care of personal grooming?: A Little Help from another person toileting, which includes using toliet, bedpan, or urinal?: A Little Help from another person bathing (including washing, rinsing, drying)?: A Little Help from another  person to put on and taking off regular upper body clothing?: A Little Help from another person to put on and taking off regular lower body clothing?: A Little 6 Click Score: 19    End of Session Equipment Utilized During Treatment: Rolling walker (2 wheels)  OT Visit Diagnosis: Other symptoms and signs involving cognitive function;Pain Pain - part of body:  (abdomen)   Activity Tolerance Patient tolerated treatment well   Patient Left in chair;with call bell/phone within reach;with chair alarm set   Nurse Communication Mobility status        Time: 5003-7048 OT Time Calculation (min): 38 min  Charges: OT General Charges $OT Visit: 1 Visit OT Treatments $Self Care/Home Management : 38-52 mins  Delbert Phenix OT OT pager: [place] 01/13/2022, 12:28 PM

## 2022-01-13 NOTE — Progress Notes (Addendum)
10 Days Post-Op  Subjective: CC: Patient reports she is doing well. Working with OT currently.  Not eating as much as she would at home but tolerating her diet without n/v. No abdominal pain this morning. Not requiring PRN pain meds. Having ostomy output yesterday. Worked with [facility] yesterday.  Wants to go home. Husband is trying to see if they can arrange to have movers move her bedroom downstairs.   Objective: Vital signs in last 24 hours: Temp:  [98.2 F (36.8 C)-99.1 F (37.3 C)] 98.2 F (36.8 C) (02/09 0600) Pulse Rate:  [75-88] 75 (02/09 0600) Resp:  [21] 21 (02/08 1410) BP: (135-158)/(67-71) 150/71 (02/09 0600) SpO2:  [92 %-98 %] 92 % (02/09 0600) Weight:  [109.5 kg] 109.5 kg (02/09 0435) Last BM Date: 01/11/22  Intake/Output from previous day: 02/08 0701 - 02/09 0700 In: -  Out: 100 [Stool:100] Intake/Output this shift: No intake/output data recorded.  PE: Gen:  Alert, NAD, pleasant Pulm: normal rate and effort  Abd: Soft, NT, ND, stoma beefy red, edematous and overall stable. Semi formed stool and air in ostomy bag. Midline incision with staples present. There is some localized redness at sites of staples that does not spread and appears reactive (stable from yesterday) - no signs of cellulitis. Penrose drain in inferior incision   Lab Results:  Recent Labs    01/11/22 0407  WBC 9.3  HGB 10.0*  HCT 30.8*  PLT 278   BMET Recent Labs    01/11/22 0407  NA 136  K 3.9  CL 105  CO2 24  GLUCOSE 88  BUN 15  CREATININE 0.55  CALCIUM 8.2*   PT/INR No results for input(s): LABPROT, INR in the last 72 hours. CMP     Component Value Date/Time   NA 136 01/11/2022 0407   NA 142 06/17/2019 1109   K 3.9 01/11/2022 0407   CL 105 01/11/2022 0407   CO2 24 01/11/2022 0407   GLUCOSE 88 01/11/2022 0407   GLUCOSE 88 11/08/2006 1015   BUN 15 01/11/2022 0407   BUN 22 06/17/2019 1109   CREATININE 0.55 01/11/2022 0407   CREATININE 0.70 10/15/2020 0922   CALCIUM  8.2 (L) 01/11/2022 0407   PROT 5.2 (L) 01/04/2022 0245   PROT 6.4 06/17/2019 1109   ALBUMIN 2.4 (L) 01/04/2022 0245   ALBUMIN 4.5 06/17/2019 1109   AST 31 01/04/2022 0245   ALT 21 01/04/2022 0245   ALKPHOS 86 01/04/2022 0245   BILITOT 1.0 01/04/2022 0245   BILITOT 0.3 06/17/2019 1109   GFRNONAA >60 01/11/2022 0407   GFRNONAA 85 10/15/2020 0922   GFRAA 98 10/15/2020 0922   Lipase  No results found for: LIPASE  Studies/Results: No results found.  Anti-infectives: Anti-infectives (From admission, onward)    Start     Dose/Rate Route Frequency Ordered Stop   01/03/22 0400  piperacillin-tazobactam (ZOSYN) IVPB 3.375 g        3.375 g 12.5 mL/hr over 240 Minutes Intravenous Every 8 hours 01/02/22 2337 01/08/22 0100   01/02/22 1930  piperacillin-tazobactam (ZOSYN) IVPB 3.375 g        3.375 g 100 mL/hr over 30 Minutes Intravenous  Once 01/02/22 1922 01/02/22 2134        Assessment/Plan POD 10 s/p exploratory laparotomy with left colectomy and transverse colostomy for perforated splenic flexure of colon due to ischemia or diverticular disease on 01/03/22 by Dr. Thermon Leyland - Tolerating diet and having bowel function - WOC care appreciated. Daughter and son present  for teaching 2/6. Additional teaching with patient done on 2/8. Will also refer to the ostomy clinic - Completed post op abx x 5 days - Change inferior dressing at least BID but prn for saturation; remove Penrose prior to discharge. Monitor incision, some reactive erythema around staples currently without overt signs of cellulitis  - Path without evidence of malignancy. Segment of colon noted with perforation, ischemic changes and acute serositis  - Pulm toilet - Continue to mobilize. PT  - SNF vs HHPT w/ 24/7 supervision from family and patient's bedroom/sleeping situation moved to the first floor. Patient would like to go home. Will reach out to family to discuss with them. Sounds like the husband is trying to have  movers move her bedroom to the first floor but it may not be till Friday/the weekend.   Addendum: Able to speak with the husband on speaker phone while in patient's room. He reports he/family is able to provide 24/7 supervision and is having movers move bedroom furniture downstairs tomorrow AM. They feel comfortable with her going home and patient is very motivated about going home. They asked if patient could have another ostomy teaching session before d/c. I have reached out to [facility]. Also referred to ostomy clinic and wrote for [facility] RN. HH PT/OT also has been written for. I have updated TRH, RN and TOC. Will ask if PT can see her tomorrow morning for an additional session before d/c.    FEN: Soft diet, ensure TID, SLIV VTE: SCDs. Lovenox  ID: Zosyn 1/29>>2/4. WBC wnl 2/7. Afebrile  Foley: removed 2/1, voiding   Recent UTI OSA AKI - Resolved   LOS: 11 days   This care is post op  Jillyn Ledger , [facility] 01/13/2022, 8:38 AM Please see Amion for pager number during day hours 7:00am-4:30pm

## 2022-01-13 NOTE — Progress Notes (Signed)
[facility]                                                                               Terri Rodriguez, is a 76 y.o. 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 She had been attempting to have a bowel cleansing at home with a trial of MiraLAX starting on 12/28/2021 with little success.  She ultimately presented on 01/02/2022 with ongoing difficulty having bowel movement and ensuing hypotension. CT abdomen/pelvis performed revealed free air concerning for underlying bowel perforation with site on visualized but presumed colonic.  She was evaluated by general surgery and underwent urgent ex lap which showed perforated splenic flexure diverticulitis and she was treated with colon resection and placement of colostomy.  Surgery performed on 01/03/2022.   Assessment & Plan    Assessment and Plan: Physical deconditioning- (present on admission) - PT OT evaluation initially recommended SNF.  However patient now wants to go home  -Patient's family will be able to provide 24/7 supervision and feel comfortable with her going home.  They requested another ostomy and PT session before discharge tomorrow.   IBS (irritable bowel syndrome) - likely constipation predominant; did not tolerate/respond to miralax trial prior to admission - continue monitoring ostomy output, will need bowel regimen at discharge  Perforation of colon s/p Hartmann colectomy/colostomy 01/03/2022- (present on admission) - presented with hypotension and difficulty with bowel movements at home and having failed laxative trial - CT abdomen/pelvis showed perforated viscus.  Ex lap  performed on 01/03/2022 showed perforated splenic flexure diverticulitis; treated with resection and colostomy placement -Completed postop antibiotics x 5 days. -Surgery recommendations reviewed, remove Penrose prior to discharge, change inferior dressing at least twice daily but as needed for saturation. -Remove staples on 2/13 -Currently stable, receiving ostomy teaching per wound nurse -Tolerating diet   Obstructive sleep apnea- (present on admission) - continue CPAP qhs  Normocytic anemia- (present on admission) - baseline approx 12g/dL -Mild downtrend likely due to recent surgery, no overt bleeding -H&H stable, no acute issues  Sepsis (HCC)-resolved as of 01/05/2022 - leukocytosis, tachycardia, source considered bowel perforation - continue abx - s/p surgical repair with ex lap and resection with colostomy placement on 01/03/22 - completed 5 days abx  Hypovolemic shock (HCC)-resolved as of 01/05/2022 - presented with syncope and hypovolemia; started on Levophed in ICU which was weaned off on 01/03/22  Elevated blood pressure reading-resolved as of 01/09/2022 - treat pain as needed - using labetalol or hydralazine PRN also  Diverticulitis-resolved as of 01/11/2022 - see perforated colon as well - s/p ostomy now in place -Completed 5-day course of Zosyn on 01/07/2022   Delirium due to multiple etiologies, acute, hyperactive-resolved as of 01/09/2022 - patient has known history of delirium in the past after surgery. Suspect this is multifactorial in setting of  acute infection s/p perforated colon with sedation/anesthesia as well as pain component and also some ICU delirium -Mentation has actually continued to improve over the past couple days.  She is now on progressive unit and remains awake, alert, and cooperative. - continue treating pain as necessary   Obesity Estimated body mass index is 40.47 kg/m as calculated from the following:   Height as of this encounter: 5' 4.75" (1.645  m).   Weight as of this encounter: 109.5 kg.  Code Status:  DVT Prophylaxis:  SCDs Start: 01/02/22 2140   Level of Care: Level of care: Progressive Family Communication:   Disposition Plan:     Remains inpatient appropriate: Plan for DC home in a.m.  Procedures:    Consultants:   General surgery  Antimicrobials:   Anti-infectives (From admission, onward)    Start     Dose/Rate Route Frequency Ordered Stop   01/03/22 0400  piperacillin-tazobactam (ZOSYN) IVPB 3.375 g        3.375 g 12.5 mL/hr over 240 Minutes Intravenous Every 8 hours 01/02/22 2337 01/08/22 0100   01/02/22 1930  piperacillin-tazobactam (ZOSYN) IVPB 3.375 g        3.375 g 100 mL/hr over 30 Minutes Intravenous  Once 01/02/22 1922 01/02/22 2134        Medications  Scheduled Meds:  enoxaparin (LOVENOX) injection  60 mg Subcutaneous Daily   feeding supplement  237 mL Oral TID BM   mouth rinse  15 mL Mouth Rinse BID   pantoprazole  40 mg Oral QHS   Continuous Infusions:  sodium chloride Stopped (01/02/22 2355)   PRN Meds:.acetaminophen, hydrALAZINE, ipratropium-albuterol, labetalol, lip balm, methocarbamol, morphine injection, ondansetron (ZOFRAN) IV, oxyCODONE    Subjective:   Terri Rodriguez was seen and examined today.  No acute complaints, working with PT, hoping to go home.  Ostomy working fine.  No acute nausea vomiting, abdominal pain.    Objective:   Vitals:   01/12/22 1410 01/12/22 1946 01/13/22 0435 01/13/22 0600  BP: (!) 158/67 (!) 158/67 135/67 (!) 150/71  Pulse: 82 88 77 75  Resp: (!) 21     Temp: 98.4 F (36.9 C) 99.1 F (37.3 C) 98.3 F (36.8 C) 98.2 F (36.8 C)  TempSrc: Oral Oral Oral Oral  SpO2: 97% 98% 92% 92%  Weight:   109.5 kg   Height:       No intake or output data in the 24 hours ending 01/13/22 1403 Filed Weights   01/10/22 0500 01/12/22 0609 01/13/22 0435  Weight: 113.1 kg 112.4 kg 109.5 kg     Exam General: Alert and oriented x 3, NAD Cardiovascular: S1  S2 auscultated,RRR Respiratory: Clear to auscultation bilaterally, no wheezing Gastrointestinal: Staples intact, ostomy bag with moderate amount of liquid stool, stoma large but healthy appearing Ext: no pedal edema bilaterally Neuro: no neuro deficits Psych: Normal affect and demeanor, alert and oriented x3    Data Reviewed:  I have personally reviewed following labs and imaging studies   CBC Lab Results  Component Value Date   WBC 9.3 01/11/2022   RBC 3.27 (L) 01/11/2022   HGB 10.0 (L) 01/11/2022   HCT 30.8 (L) 01/11/2022   MCV 94.2 01/11/2022   MCH 30.6 01/11/2022   PLT 278 01/11/2022   MCHC 32.5 01/11/2022   RDW 14.1 01/11/2022   LYMPHSABS 0.8 01/02/2022   MONOABS 1.1 (H) 01/02/2022   EOSABS 0.1 01/02/2022   BASOSABS 0.1 29/79/8921     Last metabolic panel Lab Results  Component Value Date   NA 136 01/11/2022   K 3.9 01/11/2022   CL 105 01/11/2022   CO2 24 01/11/2022   BUN 15 01/11/2022   CREATININE 0.55 01/11/2022   GLUCOSE 88 01/11/2022   GFRNONAA >60 01/11/2022   GFRAA 98 10/15/2020   CALCIUM 8.2 (L) 01/11/2022   PHOS 3.6 01/09/2022   PROT 5.2 (L) 01/04/2022   ALBUMIN 2.4 (L) 01/04/2022   LABGLOB 1.9 06/17/2019   AGRATIO 2.4 (H) 06/17/2019   BILITOT 1.0 01/04/2022   ALKPHOS 86 01/04/2022   AST 31 01/04/2022   ALT 21 01/04/2022   ANIONGAP 7 01/11/2022     Oakes Mccready M.D. [facility] 01/13/2022, 2:03 PM  Available via Epic secure chat 7am-7pm After 7 pm, please refer to night coverage provider listed on amion.

## 2022-01-13 NOTE — Plan of Care (Signed)

## 2022-01-13 NOTE — Progress Notes (Signed)
Placed Pt on home cpap for the night.

## 2022-01-14 MED ORDER — ACETAMINOPHEN 500 MG PO TABS
500.0000 mg | ORAL_TABLET | Freq: Four times a day (QID) | ORAL | 0 refills | Status: AC | PRN
Start: 2022-01-14 — End: ?

## 2022-01-14 MED ORDER — OXYCODONE HCL 5 MG PO TABS
2.5000 mg | ORAL_TABLET | Freq: Three times a day (TID) | ORAL | 0 refills | Status: DC | PRN
Start: 2022-01-14 — End: 2022-01-20

## 2022-01-14 NOTE — Consult Note (Signed)
[facility] Nurse ostomy follow up Stoma type/location:  Pt had colostomy surgery performed 1/30. Stomal assessment/size: Stoma is edematous, but slowly decreasing in size: 2 1/4 inches, moderately prolapsed and needs to be lifted and inserted into the pouch opening for a proper fit.   Emptied 70 cc liquid brown stool.   Peristomal skin: Red moist partial thickness excoriation underneath the stoma, where is is heavy and laying against the skin.  Applied barrier ring around the edges to attempt to protect the location. Education provided: Pt was able to assist with barrier ring sizing and placement of one piece pouch.  She was able to open and close velcro to empty.  Pt has decided not to use the clamp. Applied 1 piece pouch and barrier ring 5 sets of supplies left at the bedside; one piece pouch, Lawson # [phone] and barrier ring Kellie Simmering # [id number].  Educational materials in the room.  Reviewed pouching routines and ordering supplies.  Pt plans to discharge home today. Enrolled patient in [place] Discharge program: Yes, previously. Julien Girt MSN, RN, [place], [place], [place]

## 2022-01-14 NOTE — Discharge Summary (Signed)
Physician Discharge Summary   Patient: Terri Rodriguez MRN: [id number] DOB: 1946-04-30  Admit date:     01/02/2022  Discharge date: 01/14/22  Discharge Physician: Estill Cotta   PCP: Burnis Medin, MD   Recommendations at discharge:   Continue dressing changes twice daily and as needed.  Patient to follow-up with surgery.  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 She had been attempting to have a bowel cleansing at home with a trial of MiraLAX starting on 12/28/2021 with little success.  She ultimately presented on 01/02/2022 with ongoing difficulty having bowel movement and ensuing hypotension. CT abdomen/pelvis performed revealed free air concerning for underlying bowel perforation with site on visualized but presumed colonic.  She was evaluated by general surgery and underwent urgent ex lap which showed perforated splenic flexure diverticulitis and she was treated with colon resection and placement of colostomy.  Surgery performed on 01/03/2022.  Assessment and Plan: Physical deconditioning- (present on admission) - PT OT evaluation initially recommended SNF.  However patient now wants to go home  -Patient's family will be able to provide 24/7 supervision and feel comfortable with her going home.    IBS (irritable bowel syndrome) - likely constipation predominant; did not tolerate/respond to miralax trial prior to admission - continue monitoring ostomy output, will need  bowel regimen at discharge  Perforation of colon s/p Hartmann colectomy/colostomy 01/03/2022- (present on admission) - presented with hypotension and difficulty with bowel movements at home and having failed laxative trial - CT abdomen/pelvis showed perforated viscus.  Ex lap performed on 01/03/2022 showed perforated splenic flexure diverticulitis; treated with resection and colostomy placement -Completed postop antibiotics x 5 days. -Surgery removed penrose prior to discharge, recommended change inferior dressing at least twice daily but as needed for saturation. -Remove staples on 2/13 -Currently stable, receiving ostomy teaching per wound nurse -Tolerating diet   Obstructive sleep apnea- (present on admission) - continue CPAP qhs  Normocytic anemia- (present on admission) - baseline approx 12g/dL -Mild downtrend likely due to recent surgery, no overt bleeding -H&H stable, no acute issues  Sepsis (HCC)-resolved as of 01/05/2022 - leukocytosis, tachycardia, source considered bowel perforation - continue abx - s/p surgical repair with ex lap and resection with colostomy placement on 01/03/22 - completed 5 days abx  Hypovolemic shock (HCC)-resolved as of 01/05/2022 - presented with syncope and hypovolemia; started on Levophed in ICU which was weaned off on 01/03/22   Diverticulitis-resolved as of 01/11/2022 - see perforated colon as well - s/p ostomy now in place -Completed 5-day course of Zosyn on 01/07/2022   Delirium due to multiple etiologies, acute, hyperactive-resolved as of 01/09/2022 - patient has known history of delirium in the past after surgery. Suspect this is multifactorial in setting of acute infection s/p perforated colon with sedation/anesthesia as well as pain component and also some ICU delirium -Mentation has improved, currently at baseline   Pain control - Reliance was reviewed. and patient was instructed, not to  drive, operate heavy machinery, perform activities at heights, swimming or participation in water activities or provide baby-sitting services while on Pain, Sleep and Anxiety Medications; until their outpatient Physician has advised to do so again. Also recommended to not to take more than prescribed Pain, Sleep and Anxiety Medications.   Consultants:  General surgery Procedures performed:   Disposition: Home Diet recommendation:  Discharge Diet Orders (From admission, onward)     Start     Ordered   01/14/22 0000  Diet - low sodium heart healthy        01/14/22 0850           Cardiac diet  DISCHARGE MEDICATION: Allergies as of 01/14/2022       Reactions   Codeine    nausea   Crab [shellfish Allergy] Itching, Nausea And Vomiting        Medication List     STOP taking these medications    amLODipine 5 MG tablet Commonly known as: NORVASC   aspirin 81 MG tablet   celecoxib 200 MG capsule Commonly known as: CELEBREX   ciprofloxacin 500 MG tablet Commonly known as: Cipro       TAKE these medications    acetaminophen 500 MG tablet Commonly known as: TYLENOL Take 1 tablet (500 mg total) by mouth every 6 (six) hours as needed for fever or mild pain.   amoxicillin 500 MG capsule Commonly known as: AMOXIL Take 2,000 mg by mouth See admin instructions. 1 hour prior to dental procedures.   atorvastatin 20 MG tablet Commonly known as: LIPITOR TAKE 1 TABLET BY MOUTH EVERY DAY AT 6 PM What changed: See the new instructions.   Azelaic Acid 15 % gel Apply 1 application topically 2 (two) times daily as needed (rosacea). After skin is thoroughly washed and patted dry, gently but thoroughly massage a thin film of azelaic acid cream into the affected area t daily,after shower.   carvedilol 3.125 MG tablet Commonly known as: COREG Take 1 tablet by mouth 2 times daily with a meal.   clindamycin 1 % external solution Commonly known as: CLEOCIN T 1 application 2 (two)  times daily as needed (for scalp).   clobetasol 0.05 % external solution Commonly known as: TEMOVATE Apply 1 application topically daily as needed (itching).   diclofenac sodium 1 % Gel Commonly known as: VOLTAREN Apply 2-4 g topically 4 (four) times daily. As needed for  Arthritis  Pain. What changed:  when to take this reasons to take this additional instructions   DULoxetine 60 MG capsule Commonly known as: CYMBALTA TAKE 1 CAPSULE BY MOUTH EVERY EVENING What changed:  how to take this when to take this   estradiol 0.5 MG tablet Commonly known as: ESTRACE 1/2 tab daily What changed:  how much to take how to take this when to take this additional instructions   fexofenadine 180 MG tablet Commonly known as: ALLEGRA Take 180 mg by mouth daily.   fluocinonide 0.05 % external solution Commonly known as: LIDEX 1 application daily as needed (scalp).   ketoconazole 2 % cream Commonly known as: NIZORAL 1 application daily as needed for irritation.   linaclotide 290 MCG Caps capsule Commonly known as: Linzess Take 1 capsule (290 mcg total) by mouth daily before breakfast.   metroNIDAZOLE 0.75 % gel Commonly known as: METROGEL 1 application daily as needed.   nitroGLYCERIN 0.4 MG SL tablet Commonly known as: NITROSTAT PLACE 1 TABLET UNDER THE TONGUE EVERY 5 MINUTES AS NEEDED FOR CHEST  PAIN What changed:  how much to take when to take this reasons to take this additional instructions   omeprazole 20 MG capsule Commonly known as: PRILOSEC Take 40 mg by mouth daily.   oxyCODONE 5 MG immediate release tablet Commonly known as: Oxy IR/ROXICODONE Take 0.5-1 tablets (2.5-5 mg total) by mouth every 8 (eight) hours as needed for moderate pain or severe pain (2.5 mg for moderate pain, 5 mg for severe pain).   pregabalin 50 MG capsule Commonly known as: Lyrica Take orally weaning with 100mg  ; !00 mg 50 mg 100 mg , per day  for one week ,then decrease to 100 -50 -50 mg  per day, then 50 mg tid or as directed What changed:  how much to take how to take this when to take this additional instructions   psyllium 0.52 g capsule Commonly known as: REGULOID Take 0.52 g by mouth 2 (two) times daily.   ramipril 10 MG capsule Commonly known as: ALTACE TAKE 1 CAPSULE BY MOUTH EVERY DAY What changed: how much to take   valACYclovir 1000 MG tablet Commonly known as: VALTREX TAKE 2 TABLET BY MOUTH Q12 hours X 2 DOSES WITH FEVER BLISTERS What changed:  how much to take how to take this when to take this reasons to take this additional instructions               Discharge Care Instructions  (From admission, onward)           Start     Ordered   01/14/22 0000  Change dressing (specify)       Comments: Dressing change: BID times per day and PRN soiling   01/14/22 0850            Follow-up Information     [facility] [place]. Go on 01/17/2022.   Specialty: General Surgery Why: 2:00 PM For staple removal. Please arrive 30 min prior to appointment time for check in. If staples able to be removed at rehab facility, ok to call and cancel  this visit. Contact information: [place] [address] [phone]         Stechschulte, Nickola Major, MD. Go on 01/27/2022.   Specialty: Surgery Why: 9:10 AM. Please arrive 15 min prior to appointment time. Bring photo ID and insurance information. Contact information: [place]. [address] [place] [address] [phone]         Burnis Medin, MD. Schedule an appointment as soon as possible for a visit in 2 week(s).   Specialties: Internal Medicine, Pediatrics Why: for hospital follow-up Contact information: [place] [address] [phone]         Troy Sine, MD .   Specialty: Cardiology Contact information: [address] [place] [address] [phone]                 Discharge  Exam: Danley Danker Weights   01/10/22 0500 01/12/22 0609 01/13/22 0435  Weight: 113.1 kg 112.4 kg 109.5 kg   S: Doing well, tolerating diet, eager to go home today  Physical Exam General: Alert and oriented x 3, NAD Cardiovascular: S1 S2 clear, RRR. No pedal edema b/l Respiratory: CTAB, no wheezing, rales or rhonchi Gastrointestinal: Soft, soft NT, ND, stoma large, red, liquid stool in ostomy bag Ext: no pedal edema bilaterally Neuro: no new deficits   Condition at discharge: good  The results of significant diagnostics from this hospitalization (including imaging,  microbiology, ancillary and laboratory) are listed below for reference.   Imaging Studies: CT ABDOMEN PELVIS WO CONTRAST  Result Date: 01/02/2022 CLINICAL DATA:  Diarrhea and abdominal pain. EXAM: CT ABDOMEN AND PELVIS WITHOUT CONTRAST TECHNIQUE: Multidetector CT imaging of the abdomen and pelvis was performed following the standard protocol without IV contrast. RADIATION DOSE REDUCTION: This exam was performed according to the departmental dose-optimization program which includes automated exposure control, adjustment of the mA and/or kV according to patient size and/or use of iterative reconstruction technique. COMPARISON:  CT abdomen and pelvis 01/08/2008. MRI abdomen 10/21/2009. FINDINGS: Lower chest: There is atelectasis in the lung bases. Hepatobiliary: Hypodense lesions in the left lobe of the liver are again seen and have increased in size with the largest now measuring 2.3 cm, characterized as a cyst. Hypodense lesion in the dome of the liver measures 3.6 x 1.4 cm measuring similar to prior MRI in 2010. This is previously characterized as atypical hemangioma. Gallstones and sludge are present. No biliary ductal dilatation. Pancreas: Unremarkable. No pancreatic ductal dilatation or surrounding inflammatory changes. Spleen: Normal in size without focal abnormality. Adrenals/Urinary Tract: Adrenal glands are unremarkable. Kidneys are  normal, without renal calculi, focal lesion, or hydronephrosis. Bladder is unremarkable. Stomach/Bowel: There is wall thickening and inflammation of the splenic flexure, descending colon and proximal sigmoid colon compatible with colitis. There is a small amount of free air throughout the abdomen and pelvis compatible with bowel perforation. Exact site of perforation is not visualized. There is no bowel obstruction. The appendix is not seen. Small bowel and stomach appear within normal limits. No pneumatosis. No portal venous gas identified. Vascular/Lymphatic: Aortic atherosclerosis. No enlarged abdominal or pelvic lymph nodes. Reproductive: Status post hysterectomy. No adnexal masses. Other: There is a small amount of free air in the abdomen and pelvis. There is wide-mouth umbilical hernia. No abscess identified. Musculoskeletal: There is some subcutaneous scarring in the anterior thighs bilaterally. L3, L4, L5 posterior fusion hardware appears uncomplicated. Bilateral hip arthroplasties are present. No acute fractures are seen. IMPRESSION: 1. Free air compatible with bowel perforation. Exact site of bowel perforation is not visualized, but is likely from the colon. There are nonspecific colitis findings involving the splenic flexure to the level of the sigmoid colon. No abscess. 2. Cholelithiasis and gallbladder sludge. 3.  Aortic Atherosclerosis (ICD10-I70.0). These results were called by telephone at the time of interpretation on 01/02/2022 at 7:21 pm to provider DAVID YAO , who verbally acknowledged these results. Electronically Signed   By: Ronney Asters M.D.   On: 01/02/2022 19:22   DG Abd 1 View  Result Date: 01/03/2022 CLINICAL DATA:  Abdominal pain and distention, history of perforated bowel EXAM: ABDOMEN - 1 VIEW COMPARISON:  CT abdomen pelvis, 01/02/2022, abdominal radiographs 12/30/2021 FINDINGS: Gas-filled, although not overtly distended small bowel and colon throughout the abdomen and pelvis.  Previously described small volume pneumoperitoneum seen by CT is not well appreciated by supine radiograph. Status post lower lumbar fusion and bilateral hip arthroplasty. IMPRESSION: Gas-filled, although not overtly distended small bowel and colon throughout the abdomen and pelvis. Previously described small volume pneumoperitoneum seen by CT is not well appreciated by supine radiograph. Electronically Signed   By: Delanna Ahmadi M.D.   On: 01/03/2022 10:22   DG Abd 2 Views  Result Date: 12/31/2021 CLINICAL DATA:  Constipation EXAM: ABDOMEN - 2 VIEW COMPARISON:  None. FINDINGS: No significantly distended small bowel loops identified to suggest small bowel obstruction. There is bowel gas and retained fecal material seen  throughout the colon, moderate to large amount of fecal material on the left. No suspicious calcifications identified. IMPRESSION: Moderate to large amount of retained fecal material and gas in the colon. Electronically Signed   By: Ofilia Neas M.D.   On: 12/31/2021 15:04    Microbiology: Results for orders placed or performed during the hospital encounter of 01/02/22  Resp Panel by RT-PCR (Flu A&B, Covid)     Status: None   Collection Time: 01/02/22  7:32 PM   Specimen: Nasopharyngeal(NP) swabs in vial transport medium  Result Value Ref Range Status   SARS Coronavirus 2 by RT PCR NEGATIVE NEGATIVE Final    Comment: (NOTE) SARS-CoV-2 target nucleic acids are NOT DETECTED.  The SARS-CoV-2 RNA is generally detectable in upper respiratory specimens during the acute phase of infection. The lowest concentration of SARS-CoV-2 viral copies this assay can detect is 138 copies/mL. A negative result does not preclude SARS-Cov-2 infection and should not be used as the sole basis for treatment or other patient management decisions. A negative result may occur with  improper specimen collection/handling, submission of specimen other than nasopharyngeal swab, presence of viral  mutation(s) within the areas targeted by this assay, and inadequate number of viral copies(<138 copies/mL). A negative result must be combined with clinical observations, patient history, and epidemiological information. The expected result is Negative.  Fact Sheet for Patients:  EntrepreneurPulse.com.au  Fact Sheet for Healthcare Providers:  IncredibleEmployment.be  This test is no t yet approved or cleared by the [place] FDA and  has been authorized for detection and/or diagnosis of SARS-CoV-2 by FDA under an Emergency Use Authorization (EUA). This EUA will remain  in effect (meaning this test can be used) for the duration of the COVID-19 declaration under Section 564(b)(1) of the Act, 21 U.S.C.section 360bbb-3(b)(1), unless the authorization is terminated  or revoked sooner.       Influenza A by PCR NEGATIVE NEGATIVE Final   Influenza B by PCR NEGATIVE NEGATIVE Final    Comment: (NOTE) The Xpert Xpress SARS-CoV-2/FLU/RSV plus assay is intended as an aid in the diagnosis of influenza from Nasopharyngeal swab specimens and should not be used as a sole basis for treatment. Nasal washings and aspirates are unacceptable for Xpert Xpress SARS-CoV-2/FLU/RSV testing.  Fact Sheet for Patients: EntrepreneurPulse.com.au  Fact Sheet for Healthcare Providers: IncredibleEmployment.be  This test is not yet approved or cleared by the [place] FDA and has been authorized for detection and/or diagnosis of SARS-CoV-2 by FDA under an Emergency Use Authorization (EUA). This EUA will remain in effect (meaning this test can be used) for the duration of the COVID-19 declaration under Section 564(b)(1) of the Act, 21 U.S.C. section 360bbb-3(b)(1), unless the authorization is terminated or revoked.  Performed at [facility], [place] [address] [place], [place] [address]   MRSA Next Gen by PCR,  Nasal     Status: None   Collection Time: 01/02/22  9:56 PM   Specimen: Nasal Mucosa; Nasal Swab  Result Value Ref Range Status   MRSA by PCR Next Gen NOT DETECTED NOT DETECTED Final    Comment: (NOTE) The GeneXpert MRSA Assay (FDA approved for NASAL specimens only), is one component of a comprehensive MRSA colonization surveillance program. It is not intended to diagnose MRSA infection nor to guide or monitor treatment for MRSA infections. Test performance is not FDA approved in patients less than 79 years old. Performed at [facility], [place] [address], [place], [place] [address]  Blood culture (routine x 2)     Status: None   Collection Time: 01/03/22 12:07 AM   Specimen: BLOOD  Result Value Ref Range Status   Specimen Description   Final    BLOOD LEFT HAND Performed at [place] [address], [place], [place] [address]    Special Requests   Final    BOTTLES DRAWN AEROBIC ONLY Blood Culture adequate volume Performed at [place] [address], [place], [place] [address]    Culture   Final    NO GROWTH 5 DAYS Performed at [facility], [place] [address] [place], [place] [address]    Report Status 01/08/2022 FINAL  Final  Blood culture (routine x 2)     Status: None   Collection Time: 01/03/22 12:07 AM   Specimen: BLOOD  Result Value Ref Range Status   Specimen Description   Final    BLOOD LEFT WRIST Performed at [facility] [place] [address] [place], [place] [address]    Special Requests   Final    BOTTLES DRAWN AEROBIC ONLY Blood Culture adequate volume Performed at [place] [address], [place], [place] [address]    Culture   Final    NO GROWTH 5 DAYS Performed at [facility], [place] [address] [place], [place] [address]    Report Status 01/08/2022 FINAL  Final  Resp Panel by RT-PCR (Flu A&B, Covid) Nasopharyngeal Swab     Status: None    Collection Time: 01/11/22  3:09 PM   Specimen: Nasopharyngeal Swab; Nasopharyngeal(NP) swabs in vial transport medium  Result Value Ref Range Status   SARS Coronavirus 2 by RT PCR NEGATIVE NEGATIVE Final    Comment: (NOTE) SARS-CoV-2 target nucleic acids are NOT DETECTED.  The SARS-CoV-2 RNA is generally detectable in upper respiratory specimens during the acute phase of infection. The lowest concentration of SARS-CoV-2 viral copies this assay can detect is 138 copies/mL. A negative result does not preclude SARS-Cov-2 infection and should not be used as the sole basis for treatment or other patient management decisions. A negative result may occur with  improper specimen collection/handling, submission of specimen other than nasopharyngeal swab, presence of viral mutation(s) within the areas targeted by this assay, and inadequate number of viral copies(<138 copies/mL). A negative result must be combined with clinical observations, patient history, and epidemiological information. The expected result is Negative.  Fact Sheet for Patients:  EntrepreneurPulse.com.au  Fact Sheet for Healthcare Providers:  IncredibleEmployment.be  This test is no t yet approved or cleared by the [place] FDA and  has been authorized for detection and/or diagnosis of SARS-CoV-2 by FDA under an Emergency Use Authorization (EUA). This EUA will remain  in effect (meaning this test can be used) for the duration of the COVID-19 declaration under Section 564(b)(1) of the Act, 21 U.S.C.section 360bbb-3(b)(1), unless the authorization is terminated  or revoked sooner.       Influenza A by PCR NEGATIVE NEGATIVE Final   Influenza B by PCR NEGATIVE NEGATIVE Final    Comment: (NOTE) The Xpert Xpress SARS-CoV-2/FLU/RSV plus assay is intended as an aid in the diagnosis of influenza from Nasopharyngeal swab specimens and should not be used as a sole basis for treatment.  Nasal washings and aspirates are unacceptable for Xpert Xpress SARS-CoV-2/FLU/RSV testing.  Fact Sheet for Patients: EntrepreneurPulse.com.au  Fact Sheet for Healthcare Providers: IncredibleEmployment.be  This test is not yet approved or cleared by the  [place] Architectural technologist and has been authorized for detection and/or diagnosis of SARS-CoV-2 by FDA under an Print production planner (EUA). This EUA will remain in effect (meaning this test can be used) for the duration of the COVID-19 declaration under Section 564(b)(1) of the Act, 21 U.S.C. section 360bbb-3(b)(1), unless the authorization is terminated or revoked.  Performed at [facility], [place] [address], [place], [place] [address]     Labs: CBC: Recent Labs  Lab 01/08/22 0951 01/09/22 0355 01/11/22 0407  WBC 9.7 11.3* 9.3  HGB 12.5 11.2* 10.0*  HCT 38.7 36.9 30.8*  MCV 92.8 99.7 94.2  PLT 274 278 151   Basic Metabolic Panel: Recent Labs  Lab 01/08/22 0412 01/09/22 0355 01/11/22 0407  NA 136 137 136  K 3.7 4.2 3.9  CL 101 103 105  CO2 27 19* 24  GLUCOSE 110* 84 88  BUN 12 18 15   CREATININE 0.69 0.68 0.55  CALCIUM 8.3* 8.4* 8.2*  MG 2.0 1.9 1.7  PHOS 3.4 3.6  --    Liver Function Tests: No results for input(s): AST, ALT, ALKPHOS, BILITOT, PROT, ALBUMIN in the last 168 hours. CBG: No results for input(s): GLUCAP in the last 168 hours.  Discharge time spent: greater than [place] 30 minutes.  Signed: Estill Cotta, MD [facility] 01/14/2022

## 2022-01-14 NOTE — TOC Transition Note (Addendum)
Transition of Care [facility]) - CM/SW Discharge Note   Patient Details  Name: Terri Rodriguez MRN: [id number] Date of Birth: 04-Oct-1946  [facility]) CM/SW Contact:  Leeroy Cha, RN Phone Number: 01/14/2022, 12:29 PM   Clinical Narrative:    Patient went home with daughter.   Hhc arranged through [place].  Final next level of care: Home/Self Care Barriers to Discharge: Barriers Resolved   Patient Goals and CMS Choice Patient states their goals for this hospitalization and ongoing recovery are:: Go to rehab CMS Medicare.gov Compare Post Acute Care list provided to:: Patient Represenative (must comment) Choice offered to / list presented to : Spouse, Patient  Discharge Placement                       Discharge Plan and Services In-house Referral: Clinical Social Work   Post Acute Care Choice: [place]          DME Arranged: N/A DME Agency: NA       HH Arranged: RN, PT, OT, Nurse's Aide   Date HH Agency Contacted: 01/13/22 Time Ong: 1015 Representative spoke with at [place]: stacie  Social Determinants of Health ([place]) Interventions     Readmission Risk Interventions No flowsheet data found.

## 2022-01-14 NOTE — Progress Notes (Signed)
Physical Therapy Treatment Patient Details Name: Terri Rodriguez MRN: [id number] DOB: [birth year] Today's Date: 01/14/2022   History of Present Illness Patient admitted on 01/02/22 for bowel perforation and septic shock  Patient now S/P exploratory laparotomy with left colectomy and transverse colostomy 01/03/22  and  with Post-op delirium (gradually improving). Pt with hx including but not limited to chronic constipation, IBS, OA, HLD, hepatic hemangioma, leukopenia    PT Comments    POD # 11 General Comments: AxO x 3 slightly "flighty" but easily redirected.  Required repeat instruction on navigating up/down one step she has to enter her home. General bed mobility comments: with increased time and HOB elevated plus use of rail. General transfer comment: Increased time with cues for hand placement but self able.  General Gait Details: increased time, slow but steady.  Also assisted to bathroom at Supervision level.  Pt able to perform self peri care. Pt also tolerated amb in hallway with her walker.  "Not too far" stated pt.  "I don't want to do too much". Pt declines SNF and plans to D/C back home with help from daughter.    Recommendations for follow up therapy are one component of a multi-disciplinary discharge planning process, led by the attending physician.  Recommendations may be updated based on patient status, additional functional criteria and insurance authorization.  Follow Up Recommendations  Home health PT     Assistance Recommended at Discharge Frequent or constant Supervision/Assistance  Patient can return home with the following A little help with walking and/or transfers;A little help with bathing/dressing/bathroom;Help with stairs or ramp for entrance;Assistance with cooking/housework;Direct supervision/assist for financial management;Assist for transportation   Equipment Recommendations  None recommended by PT    Recommendations for Other Services       Precautions /  Restrictions Precautions Precautions: Fall Precaution Comments: L colostomy, adominal incision LAP 1 /30/2023 Restrictions Weight Bearing Restrictions: No     Mobility  Bed Mobility Overal bed mobility: Needs Assistance Bed Mobility: Rolling, Sidelying to Sit Rolling: Supervision Sidelying to sit: Supervision Supine to sit: Supervision, HOB elevated     General bed mobility comments: with increased time and HOB elevated plus use of rail.    Transfers Overall transfer level: Needs assistance Equipment used: Rolling walker (2 wheels) Transfers: Sit to/from Stand Sit to Stand: Supervision           General transfer comment: Increased time with cues for hand placement but self able    Ambulation/Gait Ambulation/Gait assistance: Supervision Gait Distance (Feet): 56 Feet Assistive device: Rolling walker (2 wheels) Gait Pattern/deviations: Step-to pattern, Decreased stride length Gait velocity: decreased     General Gait Details: increased time, slow but steady.  Also assisted to bathroom at Supervision level.  Pt able to perform self peri care. Pt also tolerated amb in hallway with her walker.  "Not too far" stated pt.  "I don't want to do too much".   Stairs             Wheelchair Mobility    Modified Rankin (Stroke Patients Only)       Balance                                            Cognition Arousal/Alertness: Awake/alert Behavior During Therapy: WFL for tasks assessed/performed Overall Cognitive Status: No family/caregiver present to determine baseline cognitive functioning  General Comments: AxO x 3 slightly "flighty" but easily redirected.  Required repeat instruction on navigating up/down one step she has to enter her home.        Exercises      General Comments        Pertinent Vitals/Pain Pain Assessment Pain Assessment: 0-10 Pain Score: 4  Pain Location: abdominal  incision Pain Descriptors / Indicators: Discomfort, Sore, Aching Pain Intervention(s): Monitored during session    Home Living                          Prior Function            PT Goals (current goals can now be found in the care plan section) Progress towards PT goals: Progressing toward goals    Frequency    Min 3X/week      PT Plan Discharge plan needs to be updated    Co-evaluation              AM-PAC PT "6 Clicks" Mobility   Outcome Measure  Help needed turning from your back to your side while in a flat bed without using bedrails?: A Little Help needed moving from lying on your back to sitting on the side of a flat bed without using bedrails?: A Little Help needed moving to and from a bed to a chair (including a wheelchair)?: A Little Help needed standing up from a chair using your arms (e.g., wheelchair or bedside chair)?: A Little Help needed to walk in hospital room?: A Little Help needed climbing 3-5 steps with a railing? : A Little 6 Click Score: 18    End of Session Equipment Utilized During Treatment: Gait belt Activity Tolerance: Patient tolerated treatment well Patient left: in chair;with call bell/phone within reach;with chair alarm set Nurse Communication: Mobility status PT Visit Diagnosis: Muscle weakness (generalized) (M62.81);Difficulty in walking, not elsewhere classified (R26.2)     Time:10:22 - 1050 PT Time Calculation (min) (ACUTE ONLY): 27 min  Charges:  $Gait Training: 8-22 mins $Therapeutic Activity: 8-22 mins                    Rica Koyanagi  PTA Acute  Rehabilitation Services Pager      [phone] Office      [phone]

## 2022-01-14 NOTE — Plan of Care (Signed)
Adequate for discharge.

## 2022-01-14 NOTE — Progress Notes (Signed)
11 Days Post-Op  Subjective: CC: In good spirits and wants to go home. Husband is having movers move bedroom furniture downstairs today. TOC arranging [place] RN/PT/OT.   Denies any abdominal pain, n/v yesterday or this morning. No PRN medications yesterday. Appetite hasn't fully returned but tolerating diet and trying to eat more each day. Doesn't like the food here. Doesn't like ensure's/boosts. Feels when she get's home she will be able to eat more. Having ostomy output. Reports stool last emptied from ostomy bag in the middle of the night. She was able to empty the bag herself yesterday. Supposed to have another teaching with wocn today. Also supposed to have another PT session before d/c. Said she was able to get oob to the chair yesterday and walk in the hallway as well.   Objective: Vital signs in last 24 hours: Temp:  [97.7 F (36.5 C)-98.8 F (37.1 C)] 97.7 F (36.5 C) (02/10 0519) Pulse Rate:  [80-87] 80 (02/10 0519) Resp:  [18] 18 (02/10 0519) BP: (140-150)/(69-73) 150/69 (02/10 0519) SpO2:  [95 %-99 %] 99 % (02/10 0519) Last BM Date: 01/11/22  Intake/Output from previous day: 02/09 0701 - 02/10 0700 In: 480 [P.O.:480] Out: 900 [Urine:900] Intake/Output this shift: No intake/output data recorded.  PE: Gen:  Alert, NAD, pleasant Pulm: normal rate and effort  Abd: Soft, NT, ND, stoma beefy red, edematous and overall stable. Air in ostomy bag. Patient reports she had liquid stool in the bag earlier that was emptied in the middle of the night. Midline incision with staples present. There is some localized redness at sites of staples that does not spread and appears reactive (stable from yesterday) - no signs of cellulitis. Penrose drain removed.   Lab Results:  No results for input(s): WBC, HGB, HCT, PLT in the last 72 hours. BMET No results for input(s): NA, K, CL, CO2, GLUCOSE, BUN, CREATININE, CALCIUM in the last 72 hours. PT/INR No results for input(s): LABPROT, INR in  the last 72 hours. CMP     Component Value Date/Time   NA 136 01/11/2022 0407   NA 142 06/17/2019 1109   K 3.9 01/11/2022 0407   CL 105 01/11/2022 0407   CO2 24 01/11/2022 0407   GLUCOSE 88 01/11/2022 0407   GLUCOSE 88 11/08/2006 1015   BUN 15 01/11/2022 0407   BUN 22 06/17/2019 1109   CREATININE 0.55 01/11/2022 0407   CREATININE 0.70 10/15/2020 0922   CALCIUM 8.2 (L) 01/11/2022 0407   PROT 5.2 (L) 01/04/2022 0245   PROT 6.4 06/17/2019 1109   ALBUMIN 2.4 (L) 01/04/2022 0245   ALBUMIN 4.5 06/17/2019 1109   AST 31 01/04/2022 0245   ALT 21 01/04/2022 0245   ALKPHOS 86 01/04/2022 0245   BILITOT 1.0 01/04/2022 0245   BILITOT 0.3 06/17/2019 1109   GFRNONAA >60 01/11/2022 0407   GFRNONAA 85 10/15/2020 0922   GFRAA 98 10/15/2020 0922   Lipase  No results found for: LIPASE  Studies/Results: No results found.  Anti-infectives: Anti-infectives (From admission, onward)    Start     Dose/Rate Route Frequency Ordered Stop   01/03/22 0400  piperacillin-tazobactam (ZOSYN) IVPB 3.375 g        3.375 g 12.5 mL/hr over 240 Minutes Intravenous Every 8 hours 01/02/22 2337 01/08/22 0100   01/02/22 1930  piperacillin-tazobactam (ZOSYN) IVPB 3.375 g        3.375 g 100 mL/hr over 30 Minutes Intravenous  Once 01/02/22 1922 01/02/22 2134  Assessment/Plan POD 11 s/p exploratory laparotomy with left colectomy and transverse colostomy for perforated splenic flexure of colon due to ischemia or diverticular disease on 01/03/22 by Dr. Thermon Leyland - Tolerating diet and having bowel function - WOC care appreciated. Daughter and son present for teaching 2/6. Additional teaching with patient done on 2/8. Supposed to have another teaching session today. Will also refer to the ostomy clinic - Completed post op abx x 5 days - Penrose drain removed. Nurse visit for staple removal arranged in office - Path without evidence of malignancy. Segment of colon noted with perforation, ischemic changes  and acute serositis  - Pulm toilet - Continue to mobilize. PT/OT  - TOC working on arranging [facility] PT/OT/RN. Okay for discharge from our standpoint after this is arranged. Husband is having movers move bedroom furniture downstairs today and during my discussion with him yesterday he reports he/family is able to provide 24/7 supervision. Follow up placed in the AVS. Discussed with TRH.    FEN: Soft diet, ensure TID, SLIV VTE: SCDs. Lovenox  ID: Zosyn 1/29>>2/4. WBC wnl 2/7. Afebrile  Foley: removed 2/1, voiding Follow-Up: Nurse visit for staple removal on 2/13. Dr. Thermon Leyland on 2/23   Recent UTI OSA AKI - Resolved   LOS: 12 days    Jillyn Ledger , [facility] 01/14/2022, 8:41 AM Please see Amion for pager number during day hours 7:00am-4:30pm

## 2022-01-18 DIAGNOSIS — K219 Gastro-esophageal reflux disease without esophagitis: Secondary | ICD-10-CM | POA: Diagnosis not present

## 2022-01-18 DIAGNOSIS — M19071 Primary osteoarthritis, right ankle and foot: Secondary | ICD-10-CM | POA: Diagnosis not present

## 2022-01-18 DIAGNOSIS — N390 Urinary tract infection, site not specified: Secondary | ICD-10-CM | POA: Diagnosis not present

## 2022-01-18 DIAGNOSIS — M961 Postlaminectomy syndrome, not elsewhere classified: Secondary | ICD-10-CM | POA: Diagnosis not present

## 2022-01-18 DIAGNOSIS — Z433 Encounter for attention to colostomy: Secondary | ICD-10-CM | POA: Diagnosis not present

## 2022-01-18 DIAGNOSIS — I951 Orthostatic hypotension: Secondary | ICD-10-CM | POA: Diagnosis not present

## 2022-01-18 DIAGNOSIS — K581 Irritable bowel syndrome with constipation: Secondary | ICD-10-CM | POA: Diagnosis not present

## 2022-01-18 DIAGNOSIS — I2510 Atherosclerotic heart disease of native coronary artery without angina pectoris: Secondary | ICD-10-CM | POA: Diagnosis not present

## 2022-01-18 DIAGNOSIS — G4733 Obstructive sleep apnea (adult) (pediatric): Secondary | ICD-10-CM | POA: Diagnosis not present

## 2022-01-18 DIAGNOSIS — R32 Unspecified urinary incontinence: Secondary | ICD-10-CM | POA: Diagnosis not present

## 2022-01-18 DIAGNOSIS — K5790 Diverticulosis of intestine, part unspecified, without perforation or abscess without bleeding: Secondary | ICD-10-CM | POA: Diagnosis not present

## 2022-01-18 DIAGNOSIS — I10 Essential (primary) hypertension: Secondary | ICD-10-CM | POA: Diagnosis not present

## 2022-01-18 DIAGNOSIS — Z48815 Encounter for surgical aftercare following surgery on the digestive system: Secondary | ICD-10-CM | POA: Diagnosis not present

## 2022-01-18 DIAGNOSIS — J309 Allergic rhinitis, unspecified: Secondary | ICD-10-CM | POA: Diagnosis not present

## 2022-01-18 DIAGNOSIS — I7300 Raynaud's syndrome without gangrene: Secondary | ICD-10-CM | POA: Diagnosis not present

## 2022-01-18 DIAGNOSIS — E785 Hyperlipidemia, unspecified: Secondary | ICD-10-CM | POA: Diagnosis not present

## 2022-01-20 ENCOUNTER — Telehealth (INDEPENDENT_AMBULATORY_CARE_PROVIDER_SITE_OTHER): Payer: Medicare Other | Admitting: Internal Medicine

## 2022-01-20 ENCOUNTER — Encounter: Payer: Self-pay | Admitting: Internal Medicine

## 2022-01-20 VITALS — BP 120/45 | HR 85

## 2022-01-20 DIAGNOSIS — Z09 Encounter for follow-up examination after completed treatment for conditions other than malignant neoplasm: Secondary | ICD-10-CM

## 2022-01-20 DIAGNOSIS — R5381 Other malaise: Secondary | ICD-10-CM

## 2022-01-20 DIAGNOSIS — G8929 Other chronic pain: Secondary | ICD-10-CM

## 2022-01-20 DIAGNOSIS — Z79899 Other long term (current) drug therapy: Secondary | ICD-10-CM

## 2022-01-20 DIAGNOSIS — Z933 Colostomy status: Secondary | ICD-10-CM

## 2022-01-20 DIAGNOSIS — M159 Polyosteoarthritis, unspecified: Secondary | ICD-10-CM

## 2022-01-20 DIAGNOSIS — R7989 Other specified abnormal findings of blood chemistry: Secondary | ICD-10-CM

## 2022-01-20 NOTE — Progress Notes (Signed)
Virtual Visit via Video Note  I connected with Terri Rodriguez on 01/20/22 at 11:00 AM EST by a video enabled telemedicine application and verified that I am speaking with the correct person using two identifiers. Location patient: home Location provider: home office Persons participating in the virtual visit: patient, provider .Spouse  [place] national recommendations  regarding COVID 19 pandemic   video visit is advised over in office visit for this patient.  Patient aware  of the limitations of evaluation and management by telemedicine and  availability of in person appointments. and agreed to proceed.   HPI: Terri Rodriguez presents for video visit follow-up post hospitalization 1/29-2/10 2023   for emergency bowel perforation ( surgery 1/30) temporary colostomy sepsis shock. Post illness delirium resolved  She came home 6 days ago.  PT/ OT beginning.HH.  Following up with surgeon. Staples out  no further complications   Ambulating with walker but difficult because of shoulder arthritis  as well as back pain predating hospitalization.  Able to eat  but "not that much"; soft food coffee banana orange juice.  In the hospital amlodipine was stopped as well as pregabalin.  She continues on ramipril carvedilol omeprazole Cymbalta Allegra and estrogen.  Is taking just 500 mg of Tylenol for pain was offered oxycodone but decided not to pick it up from the pharmacy.  She is also taking atorvastatin.  Aspirin was stopped as well. She was discharged medicine that said to continue the [place] which had just been started took 1 dose and had very liquidy output through the colostomy and stopped.  Had been on Celebrex previous that was stopped when her abnormal LFTs and question early cirrhosis on the liver ultrasound began.  Uncertain how much it helped for her arthritis and includes multiple joints. Questions are options for pain management be between simple Tylenol couple times a day and a narcotic  oxycodone. Of note at discharge her LFTs had normalized and abdominal CT with stable findings in the liver.  This included cyst possible hemangioma and gallstones.   Anemia trended down but no acute blood loss  or bleeding.   ROS: See pertinent positives and negatives per HPI.  Past Medical History:  Diagnosis Date   ALLERGIC RHINITIS    Anginal pain ([place])    Blood transfusion without reported diagnosis    Cataract    small right   Cholelithiasis 06/09/2008   gallstones 2009 on ct scan   Complication of anesthesia 2012   went to ICU after bilateral knees , unstable vital signs oct 2012, has surgery since did ok   Coronary artery disease    30 % narrowing lad   Diverticulitis    EPICONDYLITIS, LATERAL    osteoarthritis, previous joint replacements   Fall 10/2018   GERD    Hepatic hemangioma 06/09/08   Hiatal hernia 03/2000   Hx of adenomatous polyp of colon 06/05/08   Hx of cardiac catheterization 2008    clean coronarys DrKelly    Hx of chest pain 2003   neg cath remot hx of narrowwing lad in 2003 nl 2008, none in many years   HYPERLIPIDEMIA    IBS (irritable bowel syndrome)    LEUKOPENIA, CHRONIC    Mononucleosis 1963   RAYNAUD'S SYNDROME, HX OF    improved after cardiac meds initiated   Rosacea    facial   Sleep apnea    SLEEP APNEA, OBSTRUCTIVE    cpap, settings "automatic" settings 11-12   Subacute thyroiditis  UNSPECIFIED ANEMIA     Past Surgical History:  Procedure Laterality Date   ABDOMINAL HYSTERECTOMY  1993   bso   BACK SURGERY  2008   l 3 to l4 l4 to l5   BARTHOLIN GLAND CYST EXCISION Right 09/09/2019   Procedure: EXCISION OF VULVAR MASS TIMES TWO;  Surgeon: Megan Salon, MD;  Location: [facility];  Service: Gynecology;  Laterality: Right;   CARDIAC CATHETERIZATION  04/07/2007   Noncritical coronary artery disease. Contiue medical therapy.   CARDIAC CATHETERIZATION  12/03/2007   Normal LV function. Mild angiographic mitral valve  prolapse. Normal coronary arteries.   CARDIOVASCULAR STRESS TEST  11/09/2007   Moderate ischemia in Mid Anterior, Mid Anteroseptal, Apical Anterior, and Apical Septal regions. EKG negative for ischemia.   [place]   COLON RESECTION Left 01/03/2022   Procedure: COLON RESECTION;  Surgeon: Felicie Morn, MD;  Location: [facility];  Service: General;  Laterality: Left;   COLOSTOMY  01/03/2022   Procedure: COLOSTOMY;  Surgeon: Felicie Morn, MD;  Location: [facility];  Service: General;;   dental implants  11/2002,11/2011,11/2012   dental implants     x 6   [place]     d and e 1988 and [place]  09/21/2011   bilateral knee   KNEE ARTHROSCOPY     bilateral   LAPAROTOMY N/A 01/03/2022   Procedure: EXPLORATORY LAPAROTOMY;  Surgeon: Felicie Morn, MD;  Location: [facility];  Service: General;  Laterality: N/A;   lumbar surgery fixation with disc replacement  10/08   Left   NASAL SEPTOPLASTY W/ TURBINOPLASTY  05/2000   NOCTURNAL POLYSOMNOGRAM  12/31/2006   Severe obstructive sleep apnea. AHI-87/hr   REPLACEMENT TOTAL KNEE  2012   bilateral   TONSILLECTOMY  1952   adenoids too   TOTAL HIP ARTHROPLASTY Left 10/21/2013   Procedure: LEFT TOTAL HIP ARTHROPLASTY ANTERIOR APPROACH;  Surgeon: [name], MD;  Location: [facility];  Service: Orthopedics;  Laterality: Left;   TOTAL HIP ARTHROPLASTY Right 07/16/2014   Procedure: RIGHT TOTAL HIP ARTHROPLASTY ANTERIOR APPROACH;  Surgeon: [name], MD;  Location: [facility];  Service: Orthopedics;  Laterality: Right;   TRANSTHORACIC ECHOCARDIOGRAM  11/09/2007   EF 00%, LV systolic function normal. Mild aortic root dilation.   WRIST SURGERY Left 04/03/14   ORIF "distal radial head fracture"    Family History  Problem Relation Age of Onset   Rheum arthritis Mother    Diabetes Mother    Heart failure Mother    Thrombocytopenia Mother    Sleep apnea Mother     Ulcers Mother        PUD   Deep vein thrombosis Father    Pulmonary embolism Father    Osteoarthritis Father    Atrial fibrillation Father    Dementia Father    Sleep apnea Father    Sleep apnea Brother    Obesity Brother    Sleep apnea Brother    Obesity Brother    Colon cancer Maternal Aunt 2   Colon cancer Maternal Aunt 90   Pancreatic cancer Other    Uterine cancer Other    Leukemia Other    Inflammatory bowel disease Other        aunt   Congenital adrenal hyperplasia Grandchild    Atrial fibrillation Brother    Esophageal cancer Neg Hx    Rectal cancer Neg  Hx    Stomach cancer Neg Hx     Social History   Tobacco Use   Smoking status: Never   Smokeless tobacco: Never  Vaping Use   Vaping Use: Never used  Substance Use Topics   Alcohol use: Not Currently    Alcohol/week: 14.0 standard drinks    Types: 14 Glasses of wine per week    Comment: wine 2 glasses per day   Drug use: No      Current Outpatient Medications:    acetaminophen (TYLENOL) 500 MG tablet, Take 1 tablet (500 mg total) by mouth every 6 (six) hours as needed for fever or mild pain., Disp: 30 tablet, Rfl: 0   amoxicillin (AMOXIL) 500 MG capsule, Take 2,000 mg by mouth See admin instructions. 1 hour prior to dental procedures., Disp: , Rfl:    atorvastatin (LIPITOR) 20 MG tablet, TAKE 1 TABLET BY MOUTH EVERY DAY AT 6 PM (Patient taking differently: Take 20 mg by mouth daily.), Disp: 90 tablet, Rfl: 2   Azelaic Acid 15 % cream, Apply 1 application topically 2 (two) times daily as needed (rosacea). After skin is thoroughly washed and patted dry, gently but thoroughly massage a thin film of azelaic acid cream into the affected area t daily,after shower., Disp: , Rfl:    carvedilol (COREG) 3.125 MG tablet, Take 1 tablet by mouth 2 times daily with a meal. (Patient taking differently: Take 3.125 mg by mouth 2 (two) times daily with a meal.), Disp: 180 tablet, Rfl: 1   clindamycin (CLEOCIN T) 1 % external  solution, 1 application 2 (two) times daily as needed (for scalp)., Disp: , Rfl: 3   clobetasol (TEMOVATE) 0.05 % external solution, Apply 1 application topically daily as needed (itching)., Disp: , Rfl:    diclofenac sodium (VOLTAREN) 1 % GEL, Apply 2-4 g topically 4 (four) times daily. As needed for  Arthritis  Pain. (Patient taking differently: Apply 2-4 g topically 4 (four) times daily as needed (pain).), Disp: 4 Tube, Rfl: 1   DULoxetine (CYMBALTA) 60 MG capsule, TAKE 1 CAPSULE BY MOUTH EVERY EVENING (Patient taking differently: 60 mg daily.), Disp: 90 capsule, Rfl: 0   estradiol (ESTRACE) 0.5 MG tablet, 1/2 tab daily (Patient taking differently: Take 0.25 mg by mouth every other day.), Disp: 45 tablet, Rfl: 4   fexofenadine (ALLEGRA) 180 MG tablet, Take 180 mg by mouth daily., Disp: , Rfl:    fluocinonide (LIDEX) 0.05 % external solution, 1 application daily as needed (scalp)., Disp: , Rfl: 0   ketoconazole (NIZORAL) 2 % cream, 1 application daily as needed for irritation., Disp: , Rfl:    linaclotide (LINZESS) 290 MCG CAPS capsule, Take 1 capsule (290 mcg total) by mouth daily before breakfast., Disp: 30 capsule, Rfl: 5   metroNIDAZOLE (METROGEL) 6.72 % gel, 1 application daily as needed., Disp: , Rfl:    nitroGLYCERIN (NITROSTAT) 0.4 MG SL tablet, PLACE 1 TABLET UNDER THE TONGUE EVERY 5 MINUTES AS NEEDED FOR CHEST PAIN (Patient taking differently: 0.4 mg every 5 (five) minutes as needed for chest pain.), Disp: 25 tablet, Rfl: 3   omeprazole (PRILOSEC) 20 MG capsule, Take 40 mg by mouth daily., Disp: , Rfl:    psyllium (REGULOID) 0.52 g capsule, Take 0.52 g by mouth 2 (two) times daily., Disp: , Rfl:    ramipril (ALTACE) 10 MG capsule, TAKE 1 CAPSULE BY MOUTH EVERY DAY (Patient taking differently: Take 10 mg by mouth daily.), Disp: 90 capsule, Rfl: 1   valACYclovir (VALTREX)  1000 MG tablet, TAKE 2 TABLET BY MOUTH Q12 hours X 2 DOSES WITH FEVER BLISTERS (Patient taking differently: Take 1,000 mg  by mouth daily as needed (breakouts).), Disp: 30 tablet, Rfl: 1  EXAM: BP Readings from Last 3 Encounters:  01/20/22 (!) 120/45  01/14/22 (!) 150/69  12/30/21 (!) 90/40    VITALS per patient if applicable:  GENERAL: alert, oriented, appears well and in no acute distress  HEENT: atraumatic, conjunttiva clear, no obvious abnormalities on inspection of external nose and ears  NECK: normal movements of the head and neck  LUNGS: on inspection no signs of respiratory distress, breathing rate appears normal, no obvious gross SOB, gasping or wheezing  CV: no obvious cyanosis  MS: moves all visible extremities without noticeable abnormality  PSYCH/NEURO: pleasant and cooperative, no obvious depression or anxiety, speech and thought processing grossly intact Lab Results  Component Value Date   WBC 9.3 01/11/2022   HGB 10.0 (L) 01/11/2022   HCT 30.8 (L) 01/11/2022   PLT 278 01/11/2022   GLUCOSE 88 01/11/2022   CHOL 152 08/04/2021   TRIG 61.0 08/04/2021   HDL 72.70 08/04/2021   LDLCALC 67 08/04/2021   ALT 21 01/04/2022   AST 31 01/04/2022   NA 136 01/11/2022   K 3.9 01/11/2022   CL 105 01/11/2022   CREATININE 0.55 01/11/2022   BUN 15 01/11/2022   CO2 24 01/11/2022   TSH 1.58 08/04/2021   INR 1.0 04/13/2021   HGBA1C 5.7 08/04/2021   Hosp dc reveiwed  ASSESSMENT AND PLAN:  Discussed the following assessment and plan:    ICD-10-CM   1. Hospital discharge follow-up  Z09    sepsis  acute bowel perf felt ischemic.     2. Colostomy in place [facility]  Z93.3     3. Osteoarthritis of multiple joints, unspecified osteoarthritis type  M15.9     4. Physical deconditioning  R53.81     5. Abnormal LFTs  R79.89    under evaluation at time of  bowel catastrophe resolved.     6. Other chronic pain  G89.29     7. Medication management  [phone]      Agree with stopping the pregabalin added to cns fogginess  risk more than benefit seemingly  Consideration of adding back short trial  of celebrex but  cautiously with in put from Gi and surgery .  Benefit risk is uncertain Hopefully some pt will be helpful in mobility and pain .  I advise not taking linzess  will forward to GI  for concurrence opinion or other input.  Also have them  consult with GI team  About  whether need to continue evaluation for poss early cirrhosis liver based on [place] alone . ( See Ct abd)   Counseled.   Expectant management and discussion of plan and treatment with opportunity to ask questions and all were answered. The patient agreed with the plan and demonstrated an understanding of the instructions.  reviewed plan counsel 50 minutes  Advised to call back or seek an in-person evaluation if worsening  or having  further concerns  in interim. Return in about 2 months (around 03/20/2022) for or, as indicated.    Shanon Ace, MD

## 2022-01-21 ENCOUNTER — Other Ambulatory Visit: Payer: Self-pay | Admitting: Cardiovascular Disease

## 2022-01-24 ENCOUNTER — Telehealth: Payer: Self-pay | Admitting: Internal Medicine

## 2022-01-24 NOTE — Telephone Encounter (Signed)
Gennaro Africa a Physical Therapist with [facility] is calling to get verbal orders for PT once a week for nine weeks    Good callback number is [phone] Faythe Ghee to leave voicemail     Please advise

## 2022-01-24 NOTE — Progress Notes (Signed)
Pt scheduled for 4-6 wk hosp f/u with Jennifger Lemmon, PA-C on 03/02/22 @ 330pm per Dr. Tarri Glenn request. Appt reminder mailed.

## 2022-01-24 NOTE — Telephone Encounter (Signed)
Agree orders approved

## 2022-01-24 NOTE — Telephone Encounter (Signed)
Left a detailed message informing Anda Kraft of verbal orders.

## 2022-01-31 ENCOUNTER — Ambulatory Visit (HOSPITAL_COMMUNITY): Payer: Medicare Other

## 2022-02-01 ENCOUNTER — Encounter (HOSPITAL_COMMUNITY)
Admission: RE | Admit: 2022-02-01 | Discharge: 2022-02-01 | Disposition: A | Discharge status: Home / Self Care | Payer: Medicare Other | Source: Ambulatory Visit | Attending: Nurse Practitioner | Admitting: Nurse Practitioner

## 2022-02-01 ENCOUNTER — Ambulatory Visit (HOSPITAL_COMMUNITY): Payer: Medicare Other

## 2022-02-01 ENCOUNTER — Other Ambulatory Visit: Payer: Self-pay

## 2022-02-01 DIAGNOSIS — G4733 Obstructive sleep apnea (adult) (pediatric): Secondary | ICD-10-CM | POA: Diagnosis not present

## 2022-02-01 DIAGNOSIS — E65 Localized adiposity: Secondary | ICD-10-CM | POA: Diagnosis not present

## 2022-02-01 DIAGNOSIS — L24B1 Irritant contact dermatitis related to digestive stoma or fistula: Secondary | ICD-10-CM | POA: Diagnosis not present

## 2022-02-01 DIAGNOSIS — L304 Erythema intertrigo: Secondary | ICD-10-CM | POA: Insufficient documentation

## 2022-02-01 DIAGNOSIS — K9400 Colostomy complication, unspecified: Secondary | ICD-10-CM | POA: Diagnosis not present

## 2022-02-01 DIAGNOSIS — L259 Unspecified contact dermatitis, unspecified cause: Secondary | ICD-10-CM | POA: Insufficient documentation

## 2022-02-01 DIAGNOSIS — E669 Obesity, unspecified: Secondary | ICD-10-CM | POA: Insufficient documentation

## 2022-02-01 DIAGNOSIS — Z433 Encounter for attention to colostomy: Secondary | ICD-10-CM | POA: Insufficient documentation

## 2022-02-01 DIAGNOSIS — R21 Rash and other nonspecific skin eruption: Secondary | ICD-10-CM | POA: Insufficient documentation

## 2022-02-01 NOTE — Progress Notes (Signed)
[facility]   Reason for visit:  LUQ colostomy Intertriginous dermatitis to abdominal pannus. Contact dermatitis to peristomal skin HPI:  Ischemic perforation with left colectomy and transverse end colostomy ROS  Review of Systems  Constitutional:        Weakness since surgery  Gastrointestinal:        Midline surgical wound healed.  Newly epithelialized States she has mucus and feculent effluent from rectum at times.    Skin:        Intertriginous dermatitis to abdominal pannus.  Application of interdry Ag linen to wick moisture and promote healing.  Measure and cut length of InterDry to fit in skin folds that have skin breakdown  Tuck InterDry fabric into skin folds in a single layer, allow for 2 inches of overhang from skin edges to allow for wicking to occur May remove to bathe; dry area thoroughly and then tuck into affected areas again  Do not apply any creams or ointments when using InterDry DO NOT THROW AWAY FOR 5 DAYS unless soiled with stool DO NOT [facility] product, this will inactivate the silver in the material  New sheet of Interdry should be applied after 5 days of use if patient continues to have skin breakdown   Contact dermatitis to peristomal skin  protect with stoma powder and skin prep  All other systems reviewed and are negative. Vital signs:  BP 129/69    Pulse 67    Temp 98.4 F (36.9 C)    Resp 17    SpO2 96%  Exam:  Physical Exam Vitals reviewed.  Constitutional:      Appearance: Normal appearance. She is obese.  Abdominal:     Palpations: Abdomen is soft.  Skin:    Findings: Rash present.     Comments: Skin breakdown to pannus, consistent with fungal overgrowth. Using silver impregnated moisture wicking linen to treat  Neurological:     Mental Status: She is alert.  Psychiatric:        Mood and Affect: Mood normal.        Behavior: Behavior normal.        Judgment: Judgment normal.    Stoma type/location:  LUQ well budded, telescopes  at times with peristalsis Stomal assessment/size:  2" well budded Peristomal assessment:  contact dermatitis  circumferentially around stoma.  Extends 0.2 cm  Separation noted at 3 o'clock, minimal. Will protect with powder and skin prep and use of a barrier ring.  Treatment options for stomal/peristomal skin: COnvex pouch.  Needs a filtered pouch to aide in wear time, odor control and minimize opening and burping bag.  We are protecting skin with stoma powder and skin prep as well as a barrier ring Abdominal pannus is red, tender and itching.  Weeping.  Will use skin fold management linen.  Output: soft brown stool Ostomy pouching: 1pc.filtered convex with belt Education provided:  see above.     Impression/dx  Intertriginous dermatitis Colostomy Contact dermatitis Discussion  New pouching procedure.   How to use Interdry in skin folds Plan  Calll back as needed.  I have flagged all supplies in [place] and will set her up for supplies.     Visit time: 70 minutes.   Domenic Moras FNP-BC

## 2022-02-01 NOTE — Discharge Instructions (Signed)
Today we treated your abdominal rash with Interdry Ag silver linen  Leave in place for 5-6 days Measure and cut length of InterDry to fit in skin folds that have skin breakdown Tuck InterDry fabric into skin folds in a single layer, allow for 2 inches of overhang from skin edges to allow for wicking to occur May remove to bathe; dry area thoroughly and then tuck into affected areas again Do not apply any creams or ointments when using InterDry DO NOT THROW AWAY FOR 5 DAYS unless soiled with stool DO NOT [facility] product, this will inactivate the silver in the material  New sheet of Interdry should be applied after 5 days of use if patient continues to have skin breakdown    We changed your pouch to a 1 piece convex filtered pouch with a belt We treated the skin with stoma powder (lightly dusted around stoma)   Crust in the powder with skin prep Apply barrier ring Cut pouch opening to 2 inches.   Apply to skin and warm with heating pad or pillow Do not shower with ostomy belt on.  IT takes awhile to dry and is uncomfortable.

## 2022-02-02 ENCOUNTER — Encounter: Payer: Self-pay | Admitting: Internal Medicine

## 2022-02-02 NOTE — Telephone Encounter (Signed)
So gald  things are progressing    ? I can prescribe celebrex 100 mg  to take 1 bid  ( and see if insurance covers )  let  us know details if y ou want to try this

## 2022-02-03 MED ORDER — CELECOXIB 100 MG PO CAPS: 100.0000 mg | ORAL_CAPSULE | Freq: Two times a day (BID) | ORAL | 3 refills | Status: DC

## 2022-02-03 NOTE — Telephone Encounter (Signed)
Rx sent pt aware 

## 2022-02-03 NOTE — Telephone Encounter (Signed)
Yes Please order  celebrex  100 mg 1 po bid  disp 60 refill x 3 .

## 2022-02-09 DIAGNOSIS — N390 Urinary tract infection, site not specified: Secondary | ICD-10-CM

## 2022-02-09 DIAGNOSIS — Z9181 History of falling: Secondary | ICD-10-CM

## 2022-02-09 DIAGNOSIS — I951 Orthostatic hypotension: Secondary | ICD-10-CM

## 2022-02-09 DIAGNOSIS — M5136 Other intervertebral disc degeneration, lumbar region: Secondary | ICD-10-CM

## 2022-02-09 DIAGNOSIS — G8929 Other chronic pain: Secondary | ICD-10-CM

## 2022-02-09 DIAGNOSIS — Z6838 Body mass index (BMI) 38.0-38.9, adult: Secondary | ICD-10-CM

## 2022-02-09 DIAGNOSIS — Z96653 Presence of artificial knee joint, bilateral: Secondary | ICD-10-CM

## 2022-02-09 DIAGNOSIS — I10 Essential (primary) hypertension: Secondary | ICD-10-CM

## 2022-02-09 DIAGNOSIS — Z433 Encounter for attention to colostomy: Secondary | ICD-10-CM | POA: Diagnosis not present

## 2022-02-09 DIAGNOSIS — E785 Hyperlipidemia, unspecified: Secondary | ICD-10-CM

## 2022-02-09 DIAGNOSIS — M5030 Other cervical disc degeneration, unspecified cervical region: Secondary | ICD-10-CM

## 2022-02-09 DIAGNOSIS — M47812 Spondylosis without myelopathy or radiculopathy, cervical region: Secondary | ICD-10-CM

## 2022-02-09 DIAGNOSIS — M19072 Primary osteoarthritis, left ankle and foot: Secondary | ICD-10-CM

## 2022-02-09 DIAGNOSIS — G4730 Sleep apnea, unspecified: Secondary | ICD-10-CM

## 2022-02-09 DIAGNOSIS — K5790 Diverticulosis of intestine, part unspecified, without perforation or abscess without bleeding: Secondary | ICD-10-CM

## 2022-02-09 DIAGNOSIS — Z48815 Encounter for surgical aftercare following surgery on the digestive system: Secondary | ICD-10-CM | POA: Diagnosis not present

## 2022-02-09 DIAGNOSIS — K581 Irritable bowel syndrome with constipation: Secondary | ICD-10-CM | POA: Diagnosis not present

## 2022-02-09 DIAGNOSIS — K219 Gastro-esophageal reflux disease without esophagitis: Secondary | ICD-10-CM

## 2022-02-09 DIAGNOSIS — M961 Postlaminectomy syndrome, not elsewhere classified: Secondary | ICD-10-CM

## 2022-02-09 DIAGNOSIS — R32 Unspecified urinary incontinence: Secondary | ICD-10-CM

## 2022-02-09 DIAGNOSIS — J309 Allergic rhinitis, unspecified: Secondary | ICD-10-CM

## 2022-02-09 DIAGNOSIS — I2510 Atherosclerotic heart disease of native coronary artery without angina pectoris: Secondary | ICD-10-CM

## 2022-02-09 DIAGNOSIS — G4733 Obstructive sleep apnea (adult) (pediatric): Secondary | ICD-10-CM | POA: Diagnosis not present

## 2022-02-09 DIAGNOSIS — M19071 Primary osteoarthritis, right ankle and foot: Secondary | ICD-10-CM

## 2022-02-09 DIAGNOSIS — I7300 Raynaud's syndrome without gangrene: Secondary | ICD-10-CM

## 2022-02-14 ENCOUNTER — Telehealth: Payer: Self-pay | Admitting: Internal Medicine

## 2022-02-14 NOTE — Telephone Encounter (Signed)
FYI

## 2022-02-14 NOTE — Telephone Encounter (Signed)
Randy with [facility] wanted to let Dr.Panosh know that they weren't able to see her today due to [place] the beach. The patient has been there for over a week. The visit was to provide colostomy care. ? ?Louie Casa could be contacted at [phone]. ? ?Please advise. ?

## 2022-02-17 DIAGNOSIS — K581 Irritable bowel syndrome with constipation: Secondary | ICD-10-CM | POA: Diagnosis not present

## 2022-02-17 DIAGNOSIS — I2510 Atherosclerotic heart disease of native coronary artery without angina pectoris: Secondary | ICD-10-CM | POA: Diagnosis not present

## 2022-02-17 DIAGNOSIS — G4733 Obstructive sleep apnea (adult) (pediatric): Secondary | ICD-10-CM | POA: Diagnosis not present

## 2022-02-17 DIAGNOSIS — K5790 Diverticulosis of intestine, part unspecified, without perforation or abscess without bleeding: Secondary | ICD-10-CM | POA: Diagnosis not present

## 2022-02-17 DIAGNOSIS — N390 Urinary tract infection, site not specified: Secondary | ICD-10-CM | POA: Diagnosis not present

## 2022-02-17 DIAGNOSIS — I951 Orthostatic hypotension: Secondary | ICD-10-CM | POA: Diagnosis not present

## 2022-02-17 DIAGNOSIS — R32 Unspecified urinary incontinence: Secondary | ICD-10-CM | POA: Diagnosis not present

## 2022-02-17 DIAGNOSIS — E785 Hyperlipidemia, unspecified: Secondary | ICD-10-CM | POA: Diagnosis not present

## 2022-02-17 DIAGNOSIS — Z48815 Encounter for surgical aftercare following surgery on the digestive system: Secondary | ICD-10-CM | POA: Diagnosis not present

## 2022-02-17 DIAGNOSIS — I10 Essential (primary) hypertension: Secondary | ICD-10-CM | POA: Diagnosis not present

## 2022-02-17 DIAGNOSIS — M961 Postlaminectomy syndrome, not elsewhere classified: Secondary | ICD-10-CM | POA: Diagnosis not present

## 2022-02-17 DIAGNOSIS — J309 Allergic rhinitis, unspecified: Secondary | ICD-10-CM | POA: Diagnosis not present

## 2022-02-17 DIAGNOSIS — Z433 Encounter for attention to colostomy: Secondary | ICD-10-CM | POA: Diagnosis not present

## 2022-02-17 DIAGNOSIS — M19071 Primary osteoarthritis, right ankle and foot: Secondary | ICD-10-CM | POA: Diagnosis not present

## 2022-02-17 DIAGNOSIS — K219 Gastro-esophageal reflux disease without esophagitis: Secondary | ICD-10-CM | POA: Diagnosis not present

## 2022-02-17 DIAGNOSIS — I7300 Raynaud's syndrome without gangrene: Secondary | ICD-10-CM | POA: Diagnosis not present

## 2022-02-23 ENCOUNTER — Encounter: Payer: Self-pay | Admitting: Cardiovascular Disease

## 2022-03-02 ENCOUNTER — Ambulatory Visit: Payer: Medicare Other | Admitting: Physician Assistant

## 2022-03-17 DIAGNOSIS — L738 Other specified follicular disorders: Secondary | ICD-10-CM | POA: Diagnosis not present

## 2022-03-17 DIAGNOSIS — L821 Other seborrheic keratosis: Secondary | ICD-10-CM | POA: Diagnosis not present

## 2022-03-17 DIAGNOSIS — L578 Other skin changes due to chronic exposure to nonionizing radiation: Secondary | ICD-10-CM | POA: Diagnosis not present

## 2022-03-17 DIAGNOSIS — D2261 Melanocytic nevi of right upper limb, including shoulder: Secondary | ICD-10-CM | POA: Diagnosis not present

## 2022-03-18 ENCOUNTER — Telehealth (INDEPENDENT_AMBULATORY_CARE_PROVIDER_SITE_OTHER): Payer: Medicare Other | Admitting: Family Medicine

## 2022-03-18 ENCOUNTER — Telehealth: Payer: Self-pay | Admitting: Internal Medicine

## 2022-03-18 DIAGNOSIS — K529 Noninfective gastroenteritis and colitis, unspecified: Secondary | ICD-10-CM

## 2022-03-18 MED ORDER — ONDANSETRON HCL 4 MG PO TABS: 4.0000 mg | ORAL_TABLET | Freq: Three times a day (TID) | ORAL | 0 refills | Status: DC | PRN

## 2022-03-18 NOTE — Telephone Encounter (Signed)
Patient was triaged for nausea, some vomiting and diarrhea for past 12 hours but refused ED or UC. Wanted virtual. Was scheduled for virtual appointment with Banks at 3:30  ? ? ? ? ? ? ? ?FYI  ?

## 2022-03-18 NOTE — Progress Notes (Signed)
Virtual Visit via Video Note ? ?I connected with Terri Rodriguez on 03/18/22 at  3:30 PM EDT by a video enabled telemedicine application 2/2 [id number] pandemic and verified that I am speaking with the correct person using two identifiers. ? Location patient: home ?Location provider:work or home office ?Persons participating in the virtual visit: patient, provider ? ?I discussed the limitations of evaluation and management by telemedicine and the availability of in person appointments. The patient expressed understanding and agreed to proceed. ? ?Chief Complaint  ?Patient presents with  ? Nausea  ?   Colostomy bag fuller than usual   ? ? ?HPI: ?Pt started feeling strange last night after having dinner at her daughter's house.  No other family members with similar symptoms.  Pt notes having to empty her ostomy bag 7 x last night.  Notes stools have slowed down today.  Endorses HA, rigors, diaphoresis, nausea, and emesis. Pt took 2 imodium this am.    Denies fever, cough. ? ?ROS: See pertinent positives and negatives per HPI. ? ?Past Medical History:  ?Diagnosis Date  ? ALLERGIC RHINITIS   ? Anginal pain ([place])   ? Blood transfusion without reported diagnosis   ? Cataract   ? small right  ? Cholelithiasis 06/09/2008  ? gallstones 2009 on ct scan  ? Complication of anesthesia 2012  ? went to ICU after bilateral knees , unstable vital signs oct 2012, has surgery since did ok  ? Coronary artery disease   ? 30 % narrowing lad  ? Diverticulitis   ? EPICONDYLITIS, LATERAL   ? osteoarthritis, previous joint replacements  ? Fall 10/2018  ? GERD   ? Hepatic hemangioma 06/09/08  ? Hiatal hernia 03/2000  ? Hx of adenomatous polyp of colon 06/05/08  ? Hx of cardiac catheterization 2008   ? clean coronarys DrKelly   ? Hx of chest pain 2003  ? neg cath remot hx of narrowwing lad in 2003 nl 2008, none in many years  ? HYPERLIPIDEMIA   ? IBS (irritable bowel syndrome)   ? LEUKOPENIA, CHRONIC   ? Mononucleosis 1963  ? RAYNAUD'S SYNDROME, HX OF    ? improved after cardiac meds initiated  ? Rosacea   ? facial  ? Sleep apnea   ? SLEEP APNEA, OBSTRUCTIVE   ? cpap, settings "automatic" settings 11-12  ? Subacute thyroiditis   ? UNSPECIFIED ANEMIA   ? ? ?Past Surgical History:  ?Procedure Laterality Date  ? ABDOMINAL HYSTERECTOMY  1993  ? bso  ? BACK SURGERY  2008  ? l 3 to l4 l4 to l5  ? BARTHOLIN GLAND CYST EXCISION Right 09/09/2019  ? Procedure: EXCISION OF VULVAR MASS TIMES TWO;  Surgeon: Megan Salon, MD;  Location[facility];  Service: Gynecology;  Laterality: Right;  ? CARDIAC CATHETERIZATION  04/07/2007  ? Noncritical coronary artery disease. Contiue medical therapy.  ? CARDIAC CATHETERIZATION  12/03/2007  ? Normal LV function. Mild angiographic mitral valve prolapse. Normal coronary arteries.  ? CARDIOVASCULAR STRESS TEST  11/09/2007  ? Moderate ischemia in Mid Anterior, Mid Anteroseptal, Apical Anterior, and Apical Septal regions. EKG negative for ischemia.  ? [place]  ? COLON RESECTION Left 01/03/2022  ? Procedure: COLON RESECTION;  Surgeon: Felicie Morn, MD;  Location: [facility];  Service: General;  Laterality: Left;  ? COLOSTOMY  01/03/2022  ? Procedure: COLOSTOMY;  Surgeon: Felicie Morn, MD;  Location: [facility];  Service: General;;  ? dental implants  11/2002,11/2011,11/2012  ?  dental implants    ? x 6  ? DENTAL SURGERY    ? DILATION AND CURETTAGE OF UTERUS    ? d and e 1988 and 1990  ? JOINT REPLACEMENT  09/21/2011  ? bilateral knee  ? KNEE ARTHROSCOPY    ? bilateral  ? LAPAROTOMY N/A 01/03/2022  ? Procedure: EXPLORATORY LAPAROTOMY;  Surgeon: Felicie Morn, MD;  Location: [facility];  Service: General;  Laterality: N/A;  ? lumbar surgery fixation with disc replacement  10/08  ? Left  ? NASAL SEPTOPLASTY W/ TURBINOPLASTY  05/2000  ? NOCTURNAL POLYSOMNOGRAM  12/31/2006  ? Severe obstructive sleep apnea. AHI-87/hr  ? REPLACEMENT TOTAL KNEE  2012  ? bilateral  ? TONSILLECTOMY  1952  ? adenoids too  ? TOTAL  HIP ARTHROPLASTY Left 10/21/2013  ? Procedure: LEFT TOTAL HIP ARTHROPLASTY ANTERIOR APPROACH;  Surgeon: [name], MD;  Location: [facility];  Service: Orthopedics;  Laterality: Left;  ? TOTAL HIP ARTHROPLASTY Right 07/16/2014  ? Procedure: RIGHT TOTAL HIP ARTHROPLASTY ANTERIOR APPROACH;  Surgeon: [name], MD;  Location: [facility];  Service: Orthopedics;  Laterality: Right;  ? TRANSTHORACIC ECHOCARDIOGRAM  11/09/2007  ? EF 38%, LV systolic function normal. Mild aortic root dilation.  ? WRIST SURGERY Left 04/03/14  ? ORIF "distal radial head fracture"  ? ? ?Family History  ?Problem Relation Age of Onset  ? Rheum arthritis Mother   ? Diabetes Mother   ? Heart failure Mother   ? Thrombocytopenia Mother   ? Sleep apnea Mother   ? Ulcers Mother   ?     PUD  ? Deep vein thrombosis Father   ? Pulmonary embolism Father   ? Osteoarthritis Father   ? Atrial fibrillation Father   ? Dementia Father   ? Sleep apnea Father   ? Sleep apnea Brother   ? Obesity Brother   ? Sleep apnea Brother   ? Obesity Brother   ? Colon cancer Maternal Aunt 45  ? Colon cancer Maternal Aunt 90  ? Pancreatic cancer Other   ? Uterine cancer Other   ? Leukemia Other   ? Inflammatory bowel disease Other   ?     aunt  ? Congenital adrenal hyperplasia Grandchild   ? Atrial fibrillation Brother   ? Esophageal cancer Neg Hx   ? Rectal cancer Neg Hx   ? Stomach cancer Neg Hx   ? ? ? ?Current Outpatient Medications:  ?  acetaminophen (TYLENOL) 500 MG tablet, Take 1 tablet (500 mg total) by mouth every 6 (six) hours as needed for fever or mild pain., Disp: 30 tablet, Rfl: 0 ?  amoxicillin (AMOXIL) 500 MG capsule, Take 2,000 mg by mouth See admin instructions. 1 hour prior to dental procedures., Disp: , Rfl:  ?  atorvastatin (LIPITOR) 20 MG tablet, TAKE 1 TABLET BY MOUTH EVERY DAY AT 6 PM (Patient taking differently: Take 20 mg by mouth daily.), Disp: 90 tablet, Rfl: 2 ?  Azelaic Acid 15 % cream, Apply 1 application topically 2 (two) times daily as needed  (rosacea). After skin is thoroughly washed and patted dry, gently but thoroughly massage a thin film of azelaic acid cream into the affected area t daily,after shower., Disp: , Rfl:  ?  carvedilol (COREG) 3.125 MG tablet, TAKE 1 TABLET BY MOUTH 2 TIMES DAILY WITH A meal, Disp: 180 tablet, Rfl: 3 ?  celecoxib (CELEBREX) 100 MG capsule, Take 1 capsule (100 mg total) by mouth 2 (two) times daily., Disp: 60  capsule, Rfl: 3 ?  clindamycin (CLEOCIN T) 1 % external solution, 1 application 2 (two) times daily as needed (for scalp)., Disp: , Rfl: 3 ?  clobetasol (TEMOVATE) 0.05 % external solution, Apply 1 application topically daily as needed (itching)., Disp: , Rfl:  ?  diclofenac sodium (VOLTAREN) 1 % GEL, Apply 2-4 g topically 4 (four) times daily. As needed for  Arthritis  Pain. (Patient taking differently: Apply 2-4 g topically 4 (four) times daily as needed (pain).), Disp: 4 Tube, Rfl: 1 ?  DULoxetine (CYMBALTA) 60 MG capsule, TAKE 1 CAPSULE BY MOUTH EVERY EVENING (Patient taking differently: 60 mg daily.), Disp: 90 capsule, Rfl: 0 ?  estradiol (ESTRACE) 0.5 MG tablet, 1/2 tab daily (Patient taking differently: Take 0.25 mg by mouth every other day.), Disp: 45 tablet, Rfl: 4 ?  fexofenadine (ALLEGRA) 180 MG tablet, Take 180 mg by mouth daily., Disp: , Rfl:  ?  fluocinonide (LIDEX) 0.05 % external solution, 1 application daily as needed (scalp)., Disp: , Rfl: 0 ?  ketoconazole (NIZORAL) 2 % cream, 1 application daily as needed for irritation., Disp: , Rfl:  ?  linaclotide (LINZESS) 290 MCG CAPS capsule, Take 1 capsule (290 mcg total) by mouth daily before breakfast., Disp: 30 capsule, Rfl: 5 ?  metroNIDAZOLE (METROGEL) 7.65 % gel, 1 application daily as needed., Disp: , Rfl:  ?  nitroGLYCERIN (NITROSTAT) 0.4 MG SL tablet, PLACE 1 TABLET UNDER THE TONGUE EVERY 5 MINUTES AS NEEDED FOR CHEST PAIN (Patient taking differently: 0.4 mg every 5 (five) minutes as needed for chest pain.), Disp: 25 tablet, Rfl: 3 ?  omeprazole  (PRILOSEC) 20 MG capsule, Take 40 mg by mouth daily., Disp: , Rfl:  ?  psyllium (REGULOID) 0.52 g capsule, Take 0.52 g by mouth 2 (two) times daily., Disp: , Rfl:  ?  ramipril (ALTACE) 10 MG capsule, TAK

## 2022-03-21 NOTE — Telephone Encounter (Signed)
Noted  

## 2022-03-23 DIAGNOSIS — K631 Perforation of intestine (nontraumatic): Secondary | ICD-10-CM | POA: Diagnosis not present

## 2022-03-23 DIAGNOSIS — Z933 Colostomy status: Secondary | ICD-10-CM | POA: Diagnosis not present

## 2022-03-24 ENCOUNTER — Encounter: Payer: Medicare Other | Admitting: Physical Medicine & Rehabilitation

## 2022-04-07 DIAGNOSIS — G4733 Obstructive sleep apnea (adult) (pediatric): Secondary | ICD-10-CM | POA: Diagnosis not present

## 2022-04-12 ENCOUNTER — Other Ambulatory Visit: Payer: Self-pay | Admitting: Internal Medicine

## 2022-04-12 NOTE — Telephone Encounter (Signed)
Last Vv 01/20/22 ?Filled 12/29/21 ?Is it ok to refill? ?

## 2022-04-13 ENCOUNTER — Ambulatory Visit (INDEPENDENT_AMBULATORY_CARE_PROVIDER_SITE_OTHER): Payer: Medicare Other

## 2022-04-13 VITALS — Ht 64.0 in | Wt 240.0 lb

## 2022-04-13 DIAGNOSIS — Z0000 Encounter for general adult medical examination without abnormal findings: Secondary | ICD-10-CM

## 2022-04-13 NOTE — Patient Instructions (Addendum)
?Ms. Wiater , ?Thank you for taking time to come for your Medicare Wellness Visit. I appreciate your ongoing commitment to your health goals. Please review the following plan we discussed and let me know if I can assist you in the future.  ? ?These are the goals we discussed: ? Goals   ? ?   Exercise 3x per week (30 min per time)   ?   Increase physical activity (pt-stated)   ?   I would like to lose weight ?  ? ?  ?  ?This is a list of the screening recommended for you and due dates:  ?Health Maintenance  ?Topic Date Due  ? Eye exam for diabetics  12/12/2018  ? Zoster (Shingles) Vaccine (1 of 2) 07/14/2022*  ? Flu Shot  07/05/2022  ? Tetanus Vaccine  01/11/2024  ? Pneumonia Vaccine  Completed  ? DEXA scan (bone density measurement)  Completed  ? COVID-19 Vaccine  Completed  ? Hepatitis C Screening: USPSTF Recommendation to screen - Ages 22-79 yo.  Completed  ? HPV Vaccine  Aged Out  ? Colon Cancer Screening  Discontinued  ?*Topic was postponed. The date shown is not the original due date.  ?  ?Advanced directives: No ? ?Conditions/risks identified: None ? ?Next appointment: Follow up in one year for your annual wellness visit  ? ? ?Preventive Care 18 Years and Older, Female ?Preventive care refers to lifestyle choices and visits with your health care provider that can promote health and wellness. ?What does preventive care include? ?A yearly physical exam. This is also called an annual well check. ?Dental exams once or twice a year. ?Routine eye exams. Ask your health care provider how often you should have your eyes checked. ?Personal lifestyle choices, including: ?Daily care of your teeth and gums. ?Regular physical activity. ?Eating a healthy diet. ?Avoiding tobacco and drug use. ?Limiting alcohol use. ?Practicing safe sex. ?Taking low-dose aspirin every day. ?Taking vitamin and mineral supplements as recommended by your health care provider. ?What happens during an annual well check? ?The services and  screenings done by your health care provider during your annual well check will depend on your age, overall health, lifestyle risk factors, and family history of disease. ?Counseling  ?Your health care provider may ask you questions about your: ?Alcohol use. ?Tobacco use. ?Drug use. ?Emotional well-being. ?Home and relationship well-being. ?Sexual activity. ?Eating habits. ?History of falls. ?Memory and ability to understand (cognition). ?Work and work Statistician. ?Reproductive health. ?Screening  ?You may have the following tests or measurements: ?Height, weight, and BMI. ?Blood pressure. ?Lipid and cholesterol levels. These may be checked every 5 years, or more frequently if you are over 99 years old. ?Skin check. ?Lung cancer screening. You may have this screening every year starting at age 87 if you have a 30-pack-year history of smoking and currently smoke or have quit within the past 15 years. ?Fecal occult blood test (FOBT) of the stool. You may have this test every year starting at age 103. ?Flexible sigmoidoscopy or colonoscopy. You may have a sigmoidoscopy every 5 years or a colonoscopy every 10 years starting at age 71. ?Hepatitis C blood test. ?Hepatitis B blood test. ?Sexually transmitted disease (STD) testing. ?Diabetes screening. This is done by checking your blood sugar (glucose) after you have not eaten for a while (fasting). You may have this done every 1-3 years. ?Bone density scan. This is done to screen for osteoporosis. You may have this done starting at age 22. ?Mammogram. This  may be done every 1-2 years. Talk to your health care provider about how often you should have regular mammograms. ?Talk with your health care provider about your test results, treatment options, and if necessary, the need for more tests. ?Vaccines  ?Your health care provider may recommend certain vaccines, such as: ?Influenza vaccine. This is recommended every year. ?Tetanus, diphtheria, and acellular pertussis (Tdap,  Td) vaccine. You may need a Td booster every 10 years. ?Zoster vaccine. You may need this after age 57. ?Pneumococcal 13-valent conjugate (PCV13) vaccine. One dose is recommended after age 47. ?Pneumococcal polysaccharide (PPSV23) vaccine. One dose is recommended after age 53. ?Talk to your health care provider about which screenings and vaccines you need and how often you need them. ?This information is not intended to replace advice given to you by your health care provider. Make sure you discuss any questions you have with your health care provider. ?Document Released: 12/18/2015 Document Revised: 08/10/2016 Document Reviewed: 09/22/2015 ?Elsevier Interactive Patient Education ? 2017 [name]. ? ?Fall Prevention in the Home ?Falls can cause injuries. They can happen to people of all ages. There are many things you can do to make your home safe and to help prevent falls. ?What can I do on the outside of my home? ?Regularly fix the edges of walkways and driveways and fix any cracks. ?Remove anything that might make you trip as you walk through a door, such as a raised step or threshold. ?Trim any bushes or trees on the path to your home. ?Use bright outdoor lighting. ?Clear any walking paths of anything that might make someone trip, such as rocks or tools. ?Regularly check to see if handrails are loose or broken. Make sure that both sides of any steps have handrails. ?Any raised decks and porches should have guardrails on the edges. ?Have any leaves, snow, or ice cleared regularly. ?Use sand or salt on walking paths during winter. ?Clean up any spills in your garage right away. This includes oil or grease spills. ?What can I do in the bathroom? ?Use night lights. ?Install grab bars by the toilet and in the tub and shower. Do not use towel bars as grab bars. ?Use non-skid mats or decals in the tub or shower. ?If you need to sit down in the shower, use a plastic, non-slip stool. ?Keep the floor dry. Clean up any  water that spills on the floor as soon as it happens. ?Remove soap buildup in the tub or shower regularly. ?Attach bath mats securely with double-sided non-slip rug tape. ?Do not have throw rugs and other things on the floor that can make you trip. ?What can I do in the bedroom? ?Use night lights. ?Make sure that you have a light by your bed that is easy to reach. ?Do not use any sheets or blankets that are too big for your bed. They should not hang down onto the floor. ?Have a firm chair that has side arms. You can use this for support while you get dressed. ?Do not have throw rugs and other things on the floor that can make you trip. ?What can I do in the kitchen? ?Clean up any spills right away. ?Avoid walking on wet floors. ?Keep items that you use a lot in easy-to-reach places. ?If you need to reach something above you, use a strong step stool that has a grab bar. ?Keep electrical cords out of the way. ?Do not use floor polish or wax that makes floors slippery. If you must  use wax, use non-skid floor wax. ?Do not have throw rugs and other things on the floor that can make you trip. ?What can I do with my stairs? ?Do not leave any items on the stairs. ?Make sure that there are handrails on both sides of the stairs and use them. Fix handrails that are broken or loose. Make sure that handrails are as long as the stairways. ?Check any carpeting to make sure that it is firmly attached to the stairs. Fix any carpet that is loose or worn. ?Avoid having throw rugs at the top or bottom of the stairs. If you do have throw rugs, attach them to the floor with carpet tape. ?Make sure that you have a light switch at the top of the stairs and the bottom of the stairs. If you do not have them, ask someone to add them for you. ?What else can I do to help prevent falls? ?Wear shoes that: ?Do not have high heels. ?Have rubber bottoms. ?Are comfortable and fit you well. ?Are closed at the toe. Do not wear sandals. ?If you use a  stepladder: ?Make sure that it is fully opened. Do not climb a closed stepladder. ?Make sure that both sides of the stepladder are locked into place. ?Ask someone to hold it for you, if possible. ?Clearly mark

## 2022-04-13 NOTE — Progress Notes (Signed)
? ?Subjective:  ? Terri Rodriguez is a 76 y.o. female who presents for Medicare Annual (Subsequent) preventive examination. ? ?Review of Systems    ?Virtual Visit via Telephone Note ? ?I connected with  Terri Rodriguez on 04/13/22 at  2:30 PM EDT by telephone and verified that I am speaking with the correct person using two identifiers. ? ?Location: ?Patient: Home ?Provider: Office ?Persons participating in the virtual visit: patient/Nurse Health Advisor ?  ?I discussed the limitations, risks, security and privacy concerns of performing an evaluation and management service by telephone and the availability of in person appointments. The patient expressed understanding and agreed to proceed. ? ?Interactive audio and video telecommunications were attempted between this nurse and patient, however failed, due to patient having technical difficulties OR patient did not have access to video capability.  We continued and completed visit with audio only. ? ?Some vital signs may be absent or patient reported.  ? ?Terri Peaches, LPN  ?Cardiac Risk Factors include: advanced age (>73mn, >>60women);obesity (BMI >30kg/m2) ? ?   ?Objective:  ?  ?Today's Vitals  ? 04/13/22 1441  ?Weight: 240 lb (108.9 kg)  ?Height: '5\' 4"'$  (1.626 m)  ? ?Body mass index is 41.2 kg/m?. ? ? ?  04/13/2022  ?  2:55 PM 01/07/2022  ?  6:22 PM 01/02/2022  ?  4:41 PM 10/20/2021  ?  4:33 PM 04/12/2021  ?  1:29 PM 09/09/2019  ?  9:05 AM 06/22/2015  ?  1:47 PM  ?Advanced Directives  ?Does Patient Have a Medical Advance Directive? No No No No No No No  ?Would patient like information on creating a medical advance directive? No - Patient declined No - Patient declined  No - Patient declined No - Patient declined No - Patient declined   ? ? ?Current Medications (verified) ?Outpatient Encounter Medications as of 04/13/2022  ?Medication Sig  ? acetaminophen (TYLENOL) 500 MG tablet Take 1 tablet (500 mg total) by mouth every 6 (six) hours as needed for fever or mild pain.  ?  amoxicillin (AMOXIL) 500 MG capsule Take 2,000 mg by mouth See admin instructions. 1 hour prior to dental procedures.  ? atorvastatin (LIPITOR) 20 MG tablet TAKE 1 TABLET BY MOUTH EVERY DAY AT 6 PM (Patient taking differently: Take 20 mg by mouth daily.)  ? Azelaic Acid 15 % cream Apply 1 application topically 2 (two) times daily as needed (rosacea). After skin is thoroughly washed and patted dry, gently but thoroughly massage a thin film of azelaic acid cream into the affected area t daily,after shower.  ? carvedilol (COREG) 3.125 MG tablet TAKE 1 TABLET BY MOUTH 2 TIMES DAILY WITH A meal  ? celecoxib (CELEBREX) 100 MG capsule Take 1 capsule (100 mg total) by mouth 2 (two) times daily.  ? clindamycin (CLEOCIN T) 1 % external solution 1 application 2 (two) times daily as needed (for scalp).  ? clobetasol (TEMOVATE) 0.05 % external solution Apply 1 application topically daily as needed (itching).  ? diclofenac sodium (VOLTAREN) 1 % GEL Apply 2-4 g topically 4 (four) times daily. As needed for  Arthritis  Pain. (Patient taking differently: Apply 2-4 g topically 4 (four) times daily as needed (pain).)  ? DULoxetine (CYMBALTA) 60 MG capsule TAKE 1 CAPSULE BY MOUTH EVERY EVENING (Patient taking differently: 60 mg daily.)  ? estradiol (ESTRACE) 0.5 MG tablet 1/2 tab daily (Patient taking differently: Take 0.25 mg by mouth every other day.)  ? fexofenadine (ALLEGRA) 180 MG tablet  Take 180 mg by mouth daily.  ? fluocinonide (LIDEX) 0.05 % external solution 1 application daily as needed (scalp).  ? ketoconazole (NIZORAL) 2 % cream 1 application daily as needed for irritation.  ? metroNIDAZOLE (METROGEL) 8.52 % gel 1 application daily as needed.  ? nitroGLYCERIN (NITROSTAT) 0.4 MG SL tablet PLACE 1 TABLET UNDER THE TONGUE EVERY 5 MINUTES AS NEEDED FOR CHEST PAIN (Patient taking differently: 0.4 mg every 5 (five) minutes as needed for chest pain.)  ? omeprazole (PRILOSEC) 20 MG capsule Take 40 mg by mouth daily.  ? ondansetron  (ZOFRAN) 4 MG tablet Take 1 tablet (4 mg total) by mouth every 8 (eight) hours as needed for nausea or vomiting.  ? psyllium (REGULOID) 0.52 g capsule Take 0.52 g by mouth 2 (two) times daily.  ? ramipril (ALTACE) 10 MG capsule TAKE 1 CAPSULE BY MOUTH EVERY DAY  ? valACYclovir (VALTREX) 1000 MG tablet TAKE 2 TABLET BY MOUTH Q12 hours X 2 DOSES WITH FEVER BLISTERS (Patient taking differently: Take 1,000 mg by mouth daily as needed (breakouts).)  ? [DISCONTINUED] linaclotide (LINZESS) 290 MCG CAPS capsule Take 1 capsule (290 mcg total) by mouth daily before breakfast.  ? ?No facility-administered encounter medications on file as of 04/13/2022.  ? ? ?Allergies (verified) ?Codeine and Crab [shellfish allergy]  ? ?History: ?Past Medical History:  ?Diagnosis Date  ? ALLERGIC RHINITIS   ? Anginal pain ([place])   ? Blood transfusion without reported diagnosis   ? Cataract   ? small right  ? Cholelithiasis 06/09/2008  ? gallstones 2009 on ct scan  ? Complication of anesthesia 2012  ? went to ICU after bilateral knees , unstable vital signs oct 2012, has surgery since did ok  ? Coronary artery disease   ? 30 % narrowing lad  ? Diverticulitis   ? EPICONDYLITIS, LATERAL   ? osteoarthritis, previous joint replacements  ? Fall 10/2018  ? GERD   ? Hepatic hemangioma 06/09/08  ? Hiatal hernia 03/2000  ? Hx of adenomatous polyp of colon 06/05/08  ? Hx of cardiac catheterization 2008   ? clean coronarys DrKelly   ? Hx of chest pain 2003  ? neg cath remot hx of narrowwing lad in 2003 nl 2008, none in many years  ? HYPERLIPIDEMIA   ? IBS (irritable bowel syndrome)   ? LEUKOPENIA, CHRONIC   ? Mononucleosis 1963  ? RAYNAUD'S SYNDROME, HX OF   ? improved after cardiac meds initiated  ? Rosacea   ? facial  ? Sleep apnea   ? SLEEP APNEA, OBSTRUCTIVE   ? cpap, settings "automatic" settings 11-12  ? Subacute thyroiditis   ? UNSPECIFIED ANEMIA   ? ?Past Surgical History:  ?Procedure Laterality Date  ? ABDOMINAL HYSTERECTOMY  1993  ? bso  ? BACK SURGERY   2008  ? l 3 to l4 l4 to l5  ? BARTHOLIN GLAND CYST EXCISION Right 09/09/2019  ? Procedure: EXCISION OF VULVAR MASS TIMES TWO;  Surgeon: Megan Salon, MD;  Location: [facility];  Service: Gynecology;  Laterality: Right;  ? CARDIAC CATHETERIZATION  04/07/2007  ? Noncritical coronary artery disease. Contiue medical therapy.  ? CARDIAC CATHETERIZATION  12/03/2007  ? Normal LV function. Mild angiographic mitral valve prolapse. Normal coronary arteries.  ? CARDIOVASCULAR STRESS TEST  11/09/2007  ? Moderate ischemia in Mid Anterior, Mid Anteroseptal, Apical Anterior, and Apical Septal regions. EKG negative for ischemia.  ? [place]  ? COLON RESECTION Left 01/03/2022  ?  Procedure: COLON RESECTION;  Surgeon: Felicie Morn, MD;  Location: [facility];  Service: General;  Laterality: Left;  ? COLOSTOMY  01/03/2022  ? Procedure: COLOSTOMY;  Surgeon: Felicie Morn, MD;  Location: [facility];  Service: General;;  ? dental implants  11/2002,11/2011,11/2012  ? dental implants    ? x 6  ? DENTAL SURGERY    ? DILATION AND CURETTAGE OF UTERUS    ? d and e 1988 and 1990  ? JOINT REPLACEMENT  09/21/2011  ? bilateral knee  ? KNEE ARTHROSCOPY    ? bilateral  ? LAPAROTOMY N/A 01/03/2022  ? Procedure: EXPLORATORY LAPAROTOMY;  Surgeon: Felicie Morn, MD;  Location: [facility];  Service: General;  Laterality: N/A;  ? lumbar surgery fixation with disc replacement  10/08  ? Left  ? NASAL SEPTOPLASTY W/ TURBINOPLASTY  05/2000  ? NOCTURNAL POLYSOMNOGRAM  12/31/2006  ? Severe obstructive sleep apnea. AHI-87/hr  ? REPLACEMENT TOTAL KNEE  2012  ? bilateral  ? TONSILLECTOMY  1952  ? adenoids too  ? TOTAL HIP ARTHROPLASTY Left 10/21/2013  ? Procedure: LEFT TOTAL HIP ARTHROPLASTY ANTERIOR APPROACH;  Surgeon: [name], MD;  Location: [facility];  Service: Orthopedics;  Laterality: Left;  ? TOTAL HIP ARTHROPLASTY Right 07/16/2014  ? Procedure: RIGHT TOTAL HIP ARTHROPLASTY ANTERIOR APPROACH;  Surgeon: [name], MD;  Location: [facility];  Service: Orthopedics;  Laterality: Right;  ? TRANSTHORACIC ECHOCARDIOGRAM  11/09/2007  ? EF 82%, LV systolic function normal. Mild aortic root dilation.  ? WRIST SURGERY L

## 2022-04-27 DIAGNOSIS — H16223 Keratoconjunctivitis sicca, not specified as Sjogren's, bilateral: Secondary | ICD-10-CM | POA: Diagnosis not present

## 2022-04-27 DIAGNOSIS — H40013 Open angle with borderline findings, low risk, bilateral: Secondary | ICD-10-CM | POA: Diagnosis not present

## 2022-04-27 DIAGNOSIS — H2513 Age-related nuclear cataract, bilateral: Secondary | ICD-10-CM | POA: Diagnosis not present

## 2022-04-27 DIAGNOSIS — H353131 Nonexudative age-related macular degeneration, bilateral, early dry stage: Secondary | ICD-10-CM | POA: Diagnosis not present

## 2022-04-28 ENCOUNTER — Other Ambulatory Visit: Payer: Self-pay | Admitting: Cardiovascular Disease

## 2022-05-08 DIAGNOSIS — G4733 Obstructive sleep apnea (adult) (pediatric): Secondary | ICD-10-CM | POA: Diagnosis not present

## 2022-06-04 DIAGNOSIS — H524 Presbyopia: Secondary | ICD-10-CM | POA: Diagnosis not present

## 2022-06-07 DIAGNOSIS — G4733 Obstructive sleep apnea (adult) (pediatric): Secondary | ICD-10-CM | POA: Diagnosis not present

## 2022-06-11 DIAGNOSIS — Z933 Colostomy status: Secondary | ICD-10-CM | POA: Diagnosis not present

## 2022-06-11 DIAGNOSIS — K631 Perforation of intestine (nontraumatic): Secondary | ICD-10-CM | POA: Diagnosis not present

## 2022-07-01 ENCOUNTER — Encounter: Payer: Self-pay | Admitting: Adult Health

## 2022-07-01 ENCOUNTER — Ambulatory Visit (INDEPENDENT_AMBULATORY_CARE_PROVIDER_SITE_OTHER): Payer: Medicare Other | Admitting: Adult Health

## 2022-07-01 VITALS — BP 110/70 | HR 75 | Temp 98.4°F

## 2022-07-01 DIAGNOSIS — M5441 Lumbago with sciatica, right side: Secondary | ICD-10-CM | POA: Diagnosis not present

## 2022-07-01 DIAGNOSIS — M5442 Lumbago with sciatica, left side: Secondary | ICD-10-CM

## 2022-07-01 MED ORDER — METHYLPREDNISOLONE 4 MG PO TBPK: ORAL_TABLET | ORAL | 0 refills | Status: DC

## 2022-07-01 MED ORDER — CYCLOBENZAPRINE HCL 10 MG PO TABS: 10.0000 mg | ORAL_TABLET | Freq: Every day | ORAL | 0 refills | Status: DC

## 2022-07-01 NOTE — Progress Notes (Signed)
Subjective:    Patient ID: Terri Rodriguez, female    DOB: 1946/07/31, 76 y.o.   MRN: [id number]  HPI 76 year old female who  has a past medical history of ALLERGIC RHINITIS, Anginal pain ([place]), Blood transfusion without reported diagnosis, Cataract, Cholelithiasis (07/18/4817), Complication of anesthesia (2012), Coronary artery disease, Diverticulitis, EPICONDYLITIS, LATERAL, Fall (10/2018), GERD, Hepatic hemangioma (06/09/08), Hiatal hernia (03/2000), adenomatous polyp of colon (06/05/08), cardiac catheterization (2008 ), chest pain (2003), HYPERLIPIDEMIA, IBS (irritable bowel syndrome), LEUKOPENIA, CHRONIC, Mononucleosis (1963), RAYNAUD'S SYNDROME, HX OF, Rosacea, Sleep apnea, SLEEP APNEA, OBSTRUCTIVE, Subacute thyroiditis, and UNSPECIFIED ANEMIA.  She presents to the office today for an acute issue of low back pain with radiating.  Pain radiates down into the right buttock and into the left buttock and down the outside of her left leg.  Pain is felt as sharp and stabbing.  Pain is worse with sitting, changing positions, and ambulation.  She has tried Tylenol without relief   Review of Systems See HPI   Past Medical History:  Diagnosis Date   ALLERGIC RHINITIS    Anginal pain ([place])    Blood transfusion without reported diagnosis    Cataract    small right   Cholelithiasis 06/09/2008   gallstones 2009 on ct scan   Complication of anesthesia 2012   went to ICU after bilateral knees , unstable vital signs oct 2012, has surgery since did ok   Coronary artery disease    30 % narrowing lad   Diverticulitis    EPICONDYLITIS, LATERAL    osteoarthritis, previous joint replacements   Fall 10/2018   GERD    Hepatic hemangioma 06/09/08   Hiatal hernia 03/2000   Hx of adenomatous polyp of colon 06/05/08   Hx of cardiac catheterization 2008    clean coronarys DrKelly    Hx of chest pain 2003   neg cath remot hx of narrowwing lad in 2003 nl 2008, none in many years   HYPERLIPIDEMIA    IBS (irritable  bowel syndrome)    LEUKOPENIA, CHRONIC    Mononucleosis 1963   RAYNAUD'S SYNDROME, HX OF    improved after cardiac meds initiated   Rosacea    facial   Sleep apnea    SLEEP APNEA, OBSTRUCTIVE    cpap, settings "automatic" settings 11-12   Subacute thyroiditis    UNSPECIFIED ANEMIA     Social History   Socioeconomic History   Marital status: Married    Spouse name: Not on file   Number of children: 1   Years of education: Not on file   Highest education level: Professional school degree (e.g., MD, DDS, DVM, JD)  Occupational History   Occupation: Physician  Tobacco Use   Smoking status: Never   Smokeless tobacco: Never  Vaping Use   Vaping Use: Never used  Substance and Sexual Activity   Alcohol use: Not Currently    Alcohol/week: 14.0 standard drinks of alcohol    Types: 14 Glasses of wine per week    Comment: wine 2 glasses per day   Drug use: No   Sexual activity: Yes    Partners: Male    Birth control/protection: Surgical    Comment: TAH/BSO  Other Topics Concern   Not on file  Social History Narrative   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   Social Determinants of Radio broadcast assistant  Strain: Low Risk  (04/13/2022)   Overall Financial Resource Strain (CARDIA)    Difficulty of Paying Living Expenses: Not hard at all  Food Insecurity: No Food Insecurity (04/13/2022)   Hunger Vital Sign    Worried About Running Out of Food in the Last Year: Never true    [place] in the Last Year: Never true  Transportation Needs: No Transportation Needs (04/13/2022)   PRAPARE - Hydrologist (Medical): No    Lack of Transportation (Non-Medical): No  Physical Activity: Inactive (04/13/2022)   Exercise Vital Sign    Days of Exercise per Week: 0 days    Minutes of Exercise per Session: 0 min  Stress: No Stress Concern Present (04/13/2022)   [place]    Feeling of Stress : Not at all  Social Connections: [place] (04/13/2022)   Social Connection and Isolation Panel [NHANES]    Frequency of Communication with Friends and Family: More than three times a week    Frequency of Social Gatherings with Friends and Family: Once a week    Attends Religious Services: More than 4 times per year    Active Member of [employer] or Organizations: Yes    Attends Archivist Meetings: Never    Marital Status: Married  Human resources officer Violence: Not At Risk (04/13/2022)   Humiliation, Afraid, Rape, and Kick questionnaire    Fear of Current or Ex-Partner: No    Emotionally Abused: No    Physically Abused: No    Sexually Abused: No    Past Surgical History:  Procedure Laterality Date   ABDOMINAL HYSTERECTOMY  1993   bso   BACK SURGERY  2008   l 3 to l4 l4 to l5   BARTHOLIN GLAND CYST EXCISION Right 09/09/2019   Procedure: EXCISION OF VULVAR MASS TIMES TWO;  Surgeon: Megan Salon, MD;  Location: [facility];  Service: Gynecology;  Laterality: Right;   CARDIAC CATHETERIZATION  04/07/2007   Noncritical coronary artery disease. Contiue medical therapy.   CARDIAC CATHETERIZATION  12/03/2007   Normal LV function. Mild angiographic mitral valve prolapse. Normal coronary arteries.   CARDIOVASCULAR STRESS TEST  11/09/2007   Moderate ischemia in Mid Anterior, Mid Anteroseptal, Apical Anterior, and Apical Septal regions. EKG negative for ischemia.   [place]   COLON RESECTION Left 01/03/2022   Procedure: COLON RESECTION;  Surgeon: Felicie Morn, MD;  Location: [facility];  Service: General;  Laterality: Left;   COLOSTOMY  01/03/2022   Procedure: COLOSTOMY;  Surgeon: Felicie Morn, MD;  Location: [facility];  Service: General;;   dental implants  11/2002,11/2011,11/2012   dental implants     x 6   [place]      d and e 1988 and [place]  09/21/2011   bilateral knee   KNEE ARTHROSCOPY     bilateral   LAPAROTOMY N/A 01/03/2022   Procedure: EXPLORATORY LAPAROTOMY;  Surgeon: Felicie Morn, MD;  Location: [facility];  Service: General;  Laterality: N/A;   lumbar surgery fixation with disc replacement  10/08   Left   NASAL SEPTOPLASTY W/ TURBINOPLASTY  05/2000   NOCTURNAL POLYSOMNOGRAM  12/31/2006   Severe obstructive sleep apnea. AHI-87/hr   REPLACEMENT TOTAL KNEE  2012   bilateral   TONSILLECTOMY  1952   adenoids too  TOTAL HIP ARTHROPLASTY Left 10/21/2013   Procedure: LEFT TOTAL HIP ARTHROPLASTY ANTERIOR APPROACH;  Surgeon: [name], MD;  Location: [facility];  Service: Orthopedics;  Laterality: Left;   TOTAL HIP ARTHROPLASTY Right 07/16/2014   Procedure: RIGHT TOTAL HIP ARTHROPLASTY ANTERIOR APPROACH;  Surgeon: [name], MD;  Location: [facility];  Service: Orthopedics;  Laterality: Right;   TRANSTHORACIC ECHOCARDIOGRAM  11/09/2007   EF 09%, LV systolic function normal. Mild aortic root dilation.   WRIST SURGERY Left 04/03/14   ORIF "distal radial head fracture"    Family History  Problem Relation Age of Onset   Rheum arthritis Mother    Diabetes Mother    Heart failure Mother    Thrombocytopenia Mother    Sleep apnea Mother    Ulcers Mother        PUD   Deep vein thrombosis Father    Pulmonary embolism Father    Osteoarthritis Father    Atrial fibrillation Father    Dementia Father    Sleep apnea Father    Sleep apnea Brother    Obesity Brother    Sleep apnea Brother    Obesity Brother    Colon cancer Maternal Aunt 53   Colon cancer Maternal Aunt 90   Pancreatic cancer Other    Uterine cancer Other    Leukemia Other    Inflammatory bowel disease Other        aunt   Congenital adrenal hyperplasia Grandchild    Atrial fibrillation Brother    Esophageal cancer Neg Hx    Rectal cancer Neg Hx    Stomach cancer Neg Hx     Allergies  Allergen  Reactions   Codeine     nausea   Crab [Shellfish Allergy] Itching and Nausea And Vomiting    Current Outpatient Medications on File Prior to Visit  Medication Sig Dispense Refill   acetaminophen (TYLENOL) 500 MG tablet Take 1 tablet (500 mg total) by mouth every 6 (six) hours as needed for fever or mild pain. 30 tablet 0   amLODipine (NORVASC) 5 MG tablet TAKE 1 TABLET BY MOUTH EVERY DAY 90 tablet 1   amoxicillin (AMOXIL) 500 MG capsule Take 2,000 mg by mouth See admin instructions. 1 hour prior to dental procedures.     atorvastatin (LIPITOR) 20 MG tablet TAKE 1 TABLET BY MOUTH EVERY DAY AT 6 PM (Patient taking differently: Take 20 mg by mouth daily.) 90 tablet 2   Azelaic Acid 15 % cream Apply 1 application topically 2 (two) times daily as needed (rosacea). After skin is thoroughly washed and patted dry, gently but thoroughly massage a thin film of azelaic acid cream into the affected area t daily,after shower.     carvedilol (COREG) 3.125 MG tablet TAKE 1 TABLET BY MOUTH 2 TIMES DAILY WITH A meal 180 tablet 3   celecoxib (CELEBREX) 100 MG capsule Take 1 capsule (100 mg total) by mouth 2 (two) times daily. 60 capsule 3   clindamycin (CLEOCIN T) 1 % external solution 1 application 2 (two) times daily as needed (for scalp).  3   clobetasol (TEMOVATE) 0.05 % external solution Apply 1 application topically daily as needed (itching).     diclofenac sodium (VOLTAREN) 1 % GEL Apply 2-4 g topically 4 (four) times daily. As needed for  Arthritis  Pain. (Patient taking differently: Apply 2-4 g topically 4 (four) times daily as needed (pain).) 4 Tube 1   DULoxetine (CYMBALTA) 60 MG capsule Take 1 capsule (60 mg  total) by mouth daily. 90 capsule 1   estradiol (ESTRACE) 0.5 MG tablet 1/2 tab daily (Patient taking differently: Take 0.25 mg by mouth every other day.) 45 tablet 4   fexofenadine (ALLEGRA) 180 MG tablet Take 180 mg by mouth daily.     fluocinonide (LIDEX) 0.05 % external solution 1 application  daily as needed (scalp).  0   ketoconazole (NIZORAL) 2 % cream 1 application daily as needed for irritation.     metroNIDAZOLE (METROGEL) 4.74 % gel 1 application daily as needed.     nitroGLYCERIN (NITROSTAT) 0.4 MG SL tablet PLACE 1 TABLET UNDER THE TONGUE EVERY 5 MINUTES AS NEEDED FOR CHEST PAIN (Patient taking differently: 0.4 mg every 5 (five) minutes as needed for chest pain.) 25 tablet 3   omeprazole (PRILOSEC) 20 MG capsule Take 40 mg by mouth daily.     ondansetron (ZOFRAN) 4 MG tablet Take 1 tablet (4 mg total) by mouth every 8 (eight) hours as needed for nausea or vomiting. 20 tablet 0   psyllium (REGULOID) 0.52 g capsule Take 0.52 g by mouth 2 (two) times daily.     ramipril (ALTACE) 10 MG capsule TAKE 1 CAPSULE BY MOUTH EVERY DAY 90 capsule 3   valACYclovir (VALTREX) 1000 MG tablet TAKE 2 TABLET BY MOUTH Q12 hours X 2 DOSES WITH FEVER BLISTERS (Patient taking differently: Take 1,000 mg by mouth daily as needed (breakouts).) 30 tablet 1   No current facility-administered medications on file prior to visit.    BP 110/70   Pulse 75   Temp 98.4 F (36.9 C) (Oral)   SpO2 99%       Objective:   Physical Exam Vitals and nursing note reviewed.  Constitutional:      Appearance: Normal appearance.  Musculoskeletal:        General: Tenderness present. Normal range of motion.  Skin:    General: Skin is warm and dry.  Neurological:     General: No focal deficit present.     Mental Status: She is alert and oriented to person, place, and time.  Psychiatric:        Mood and Affect: Mood normal.        Behavior: Behavior normal.        Thought Content: Thought content normal.        Judgment: Judgment normal.       Assessment & Plan:   1. Acute midline low back pain with bilateral sciatica - advised warm compresses - Follow up if not improving with medication therapy in the next 2-3 days  - She was advised of sedating effects of Flexeril  - methylPREDNISolone (MEDROL  DOSEPAK) 4 MG TBPK tablet; Take as directed  Dispense: 21 tablet; Refill: 0 - cyclobenzaprine (FLEXERIL) 10 MG tablet; Take 1 tablet (10 mg total) by mouth at bedtime.  Dispense: 15 tablet; Refill: 0  Dorothyann Peng, NP

## 2022-07-02 ENCOUNTER — Other Ambulatory Visit: Payer: Self-pay | Admitting: Internal Medicine

## 2022-07-22 DIAGNOSIS — G4733 Obstructive sleep apnea (adult) (pediatric): Secondary | ICD-10-CM | POA: Diagnosis not present

## 2022-07-29 ENCOUNTER — Other Ambulatory Visit: Payer: Self-pay | Admitting: Cardiovascular Disease

## 2022-08-12 ENCOUNTER — Encounter: Payer: Self-pay | Admitting: Gastroenterology

## 2022-08-15 ENCOUNTER — Telehealth: Payer: Self-pay | Admitting: Internal Medicine

## 2022-08-15 NOTE — Telephone Encounter (Signed)
Schedule is booked until oct 12 but patient states she was told to contact you if she needed to come in.

## 2022-08-16 ENCOUNTER — Ambulatory Visit: Payer: Medicare Other | Admitting: Internal Medicine

## 2022-08-18 ENCOUNTER — Encounter: Payer: Self-pay | Admitting: Internal Medicine

## 2022-08-18 ENCOUNTER — Telehealth (INDEPENDENT_AMBULATORY_CARE_PROVIDER_SITE_OTHER): Payer: Medicare Other | Admitting: Internal Medicine

## 2022-08-18 ENCOUNTER — Telehealth: Payer: Medicare Other | Admitting: Internal Medicine

## 2022-08-18 VITALS — Ht 64.5 in | Wt 240.0 lb

## 2022-08-18 DIAGNOSIS — M961 Postlaminectomy syndrome, not elsewhere classified: Secondary | ICD-10-CM

## 2022-08-18 DIAGNOSIS — M5410 Radiculopathy, site unspecified: Secondary | ICD-10-CM

## 2022-08-18 DIAGNOSIS — M5442 Lumbago with sciatica, left side: Secondary | ICD-10-CM | POA: Diagnosis not present

## 2022-08-18 DIAGNOSIS — M5441 Lumbago with sciatica, right side: Secondary | ICD-10-CM

## 2022-08-18 DIAGNOSIS — Z933 Colostomy status: Secondary | ICD-10-CM

## 2022-08-18 DIAGNOSIS — Z9889 Other specified postprocedural states: Secondary | ICD-10-CM | POA: Diagnosis not present

## 2022-08-18 MED ORDER — METHYLPREDNISOLONE 4 MG PO TBPK: ORAL_TABLET | ORAL | 0 refills | Status: DC

## 2022-08-18 NOTE — Progress Notes (Signed)
Virtual Visit via Video Note  I connected with Terri Rodriguez on 08/18/22 at 11:00 AM EDT by a video enabled telemedicine application and verified that I am speaking with the correct person using two identifiers. [place]  Location provider: home office Persons participating in the virtual visit: patient, provider  Patient aware  of the limitations of evaluation and management by telemedicine and  availability of in person appointments. and agreed to proceed.   HPI: Terri Rodriguez presents for video visit   about a month off and on of left tingling pins and needs left lowerbad to hip and  knee and sometimes to dorsum of foot   and lateral ankle . Pain with walking at times  has to reposition herself.  No specific weakness  . Using cane to walk ,   Comes and but ongoing  for at least a month without hx of fall of fever. Had sciatica rx by Tommi Rumps with medrol dose pack 7 and relieved the sciatica right side  Remote hx of L3-4 and L4-5 Dr Sherwood Gambler . Has had bilateral hip and knee replacements  Dr Maureen Ralphs.  Still has colostomy.  Mood  gets frustrated easily from medical limitations  ROS: See pertinent positives and negatives per HPI.  Past Medical History:  Diagnosis Date   ALLERGIC RHINITIS    Anginal pain ([place])    Blood transfusion without reported diagnosis    Cataract    small right   Cholelithiasis 06/09/2008   gallstones 2009 on ct scan   Complication of anesthesia 2012   went to ICU after bilateral knees , unstable vital signs oct 2012, has surgery since did ok   Coronary artery disease    30 % narrowing lad   Diverticulitis    EPICONDYLITIS, LATERAL    osteoarthritis, previous joint replacements   Fall 10/2018   GERD    Hepatic hemangioma 06/09/08   Hiatal hernia 03/2000   Hx of adenomatous polyp of colon 06/05/08   Hx of cardiac catheterization 2008    clean coronarys DrKelly    Hx of chest pain 2003   neg cath remot hx of narrowwing lad in  2003 nl 2008, none in many years   HYPERLIPIDEMIA    IBS (irritable bowel syndrome)    LEUKOPENIA, CHRONIC    Mononucleosis 1963   RAYNAUD'S SYNDROME, HX OF    improved after cardiac meds initiated   Rosacea    facial   Sleep apnea    SLEEP APNEA, OBSTRUCTIVE    cpap, settings "automatic" settings 11-12   Subacute thyroiditis    UNSPECIFIED ANEMIA     Past Surgical History:  Procedure Laterality Date   ABDOMINAL HYSTERECTOMY  1993   bso   BACK SURGERY  2008   l 3 to l4 l4 to l5   BARTHOLIN GLAND CYST EXCISION Right 09/09/2019   Procedure: EXCISION OF VULVAR MASS TIMES TWO;  Surgeon: Megan Salon, MD;  Location: [facility];  Service: Gynecology;  Laterality: Right;   CARDIAC CATHETERIZATION  04/07/2007   Noncritical coronary artery disease. Contiue medical therapy.   CARDIAC CATHETERIZATION  12/03/2007   Normal LV function. Mild angiographic mitral valve prolapse. Normal coronary arteries.   CARDIOVASCULAR STRESS TEST  11/09/2007   Moderate ischemia in Mid Anterior, Mid Anteroseptal, Apical Anterior, and Apical Septal regions. EKG negative for ischemia.   [place] RESECTION Left 01/03/2022   Procedure: COLON RESECTION;  Surgeon: Felicie Morn, MD;  Location: [facility];  Service: General;  Laterality: Left;   COLOSTOMY  01/03/2022   Procedure: COLOSTOMY;  Surgeon: Felicie Morn, MD;  Location: [facility];  Service: General;;   dental implants  11/2002,11/2011,11/2012   dental implants     x 6   DENTAL SURGERY     DILATION AND CURETTAGE OF UTERUS     d and e 1988 and [place]  09/21/2011   bilateral knee   KNEE ARTHROSCOPY     bilateral   LAPAROTOMY N/A 01/03/2022   Procedure: EXPLORATORY LAPAROTOMY;  Surgeon: Felicie Morn, MD;  Location: [facility];  Service: General;  Laterality: N/A;   lumbar surgery fixation with disc replacement  10/08   Left   NASAL SEPTOPLASTY W/ TURBINOPLASTY  05/2000   NOCTURNAL  POLYSOMNOGRAM  12/31/2006   Severe obstructive sleep apnea. AHI-87/hr   REPLACEMENT TOTAL KNEE  2012   bilateral   TONSILLECTOMY  1952   adenoids too   TOTAL HIP ARTHROPLASTY Left 10/21/2013   Procedure: LEFT TOTAL HIP ARTHROPLASTY ANTERIOR APPROACH;  Surgeon: [name], MD;  Location: [facility];  Service: Orthopedics;  Laterality: Left;   TOTAL HIP ARTHROPLASTY Right 07/16/2014   Procedure: RIGHT TOTAL HIP ARTHROPLASTY ANTERIOR APPROACH;  Surgeon: [name], MD;  Location: [facility];  Service: Orthopedics;  Laterality: Right;   TRANSTHORACIC ECHOCARDIOGRAM  11/09/2007   EF 47%, LV systolic function normal. Mild aortic root dilation.   WRIST SURGERY Left 04/03/14   ORIF "distal radial head fracture"    Family History  Problem Relation Age of Onset   Rheum arthritis Mother    Diabetes Mother    Heart failure Mother    Thrombocytopenia Mother    Sleep apnea Mother    Ulcers Mother        PUD   Deep vein thrombosis Father    Pulmonary embolism Father    Osteoarthritis Father    Atrial fibrillation Father    Dementia Father    Sleep apnea Father    Sleep apnea Brother    Obesity Brother    Sleep apnea Brother    Obesity Brother    Colon cancer Maternal Aunt 18   Colon cancer Maternal Aunt 90   Pancreatic cancer Other    Uterine cancer Other    Leukemia Other    Inflammatory bowel disease Other        aunt   Congenital adrenal hyperplasia Grandchild    Atrial fibrillation Brother    Esophageal cancer Neg Hx    Rectal cancer Neg Hx    Stomach cancer Neg Hx     Social History   Tobacco Use   Smoking status: Never   Smokeless tobacco: Never  Vaping Use   Vaping Use: Never used  Substance Use Topics   Alcohol use: Not Currently    Alcohol/week: 14.0 standard drinks of alcohol    Types: 14 Glasses of wine per week    Comment: wine 2 glasses per day   Drug use: No      Current Outpatient Medications:    acetaminophen (TYLENOL) 500 MG tablet, Take 1 tablet  (500 mg total) by mouth every 6 (six) hours as needed for fever or mild pain., Disp: 30 tablet, Rfl: 0   amLODipine (NORVASC) 5 MG tablet, TAKE 1 TABLET BY MOUTH EVERY DAY, Disp: 90 tablet, Rfl: 3   atorvastatin (LIPITOR) 20 MG tablet, TAKE 1 TABLET BY MOUTH  EVERY DAY AT 6 PM, Disp: 90 tablet, Rfl: 2   Azelaic Acid 15 % cream, Apply 1 application topically 2 (two) times daily as needed (rosacea). After skin is thoroughly washed and patted dry, gently but thoroughly massage a thin film of azelaic acid cream into the affected area t daily,after shower., Disp: , Rfl:    carvedilol (COREG) 3.125 MG tablet, TAKE 1 TABLET BY MOUTH 2 TIMES DAILY WITH A meal, Disp: 180 tablet, Rfl: 3   celecoxib (CELEBREX) 100 MG capsule, Take 1 capsule (100 mg total) by mouth 2 (two) times daily. (Patient taking differently: Take 100 mg by mouth 2 (two) times daily. Every other day.), Disp: 60 capsule, Rfl: 3   chlorhexidine (PERIDEX) 0.12 % solution, , Disp: , Rfl:    clindamycin (CLEOCIN T) 1 % external solution, 1 application 2 (two) times daily as needed (for scalp)., Disp: , Rfl: 3   clindamycin (CLEOCIN T) 1 % external solution, Apply topically 2 (two) times daily., Disp: , Rfl:    clobetasol (TEMOVATE) 0.05 % external solution, Apply 1 application topically daily as needed (itching)., Disp: , Rfl:    cyclobenzaprine (FLEXERIL) 10 MG tablet, Take 1 tablet (10 mg total) by mouth at bedtime., Disp: 15 tablet, Rfl: 0   diclofenac sodium (VOLTAREN) 1 % GEL, Apply 2-4 g topically 4 (four) times daily. As needed for  Arthritis  Pain. (Patient taking differently: Apply 2-4 g topically 4 (four) times daily as needed (pain).), Disp: 4 Tube, Rfl: 1   diclofenac Sodium (VOLTAREN) 1 % GEL, , Disp: , Rfl:    DULoxetine (CYMBALTA) 60 MG capsule, TAKE 1 CAPSULE BY MOUTH EVERY DAY, Disp: 90 capsule, Rfl: 1   estradiol (ESTRACE) 0.5 MG tablet, 1/2 tab daily (Patient taking differently: Take 0.25 mg by mouth every other day.), Disp: 45  tablet, Rfl: 4   fexofenadine (ALLEGRA) 180 MG tablet, Take 180 mg by mouth daily., Disp: , Rfl:    fluocinonide (LIDEX) 0.05 % external solution, 1 application daily as needed (scalp)., Disp: , Rfl: 0   ketoconazole (NIZORAL) 2 % cream, 1 application daily as needed for irritation., Disp: , Rfl:    metroNIDAZOLE (METROGEL) 2.72 % gel, 1 application daily as needed., Disp: , Rfl:    nitroGLYCERIN (NITROSTAT) 0.4 MG SL tablet, PLACE 1 TABLET UNDER THE TONGUE EVERY 5 MINUTES AS NEEDED FOR CHEST PAIN (Patient taking differently: 0.4 mg every 5 (five) minutes as needed for chest pain.), Disp: 25 tablet, Rfl: 3   omeprazole (PRILOSEC) 20 MG capsule, Take 40 mg by mouth daily., Disp: , Rfl:    ondansetron (ZOFRAN) 4 MG tablet, Take 1 tablet (4 mg total) by mouth every 8 (eight) hours as needed for nausea or vomiting., Disp: 20 tablet, Rfl: 0   psyllium (REGULOID) 0.52 g capsule, Take 0.52 g by mouth 2 (two) times daily., Disp: , Rfl:    ramipril (ALTACE) 10 MG capsule, TAKE 1 CAPSULE BY MOUTH EVERY DAY, Disp: 90 capsule, Rfl: 3   valACYclovir (VALTREX) 1000 MG tablet, TAKE 2 TABLET BY MOUTH Q12 hours X 2 DOSES WITH FEVER BLISTERS (Patient taking differently: Take 1,000 mg by mouth daily as needed (breakouts).), Disp: 30 tablet, Rfl: 1   amoxicillin (AMOXIL) 500 MG capsule, Take 2,000 mg by mouth See admin instructions. 1 hour prior to dental procedures. (Patient not taking: Reported on 08/18/2022), Disp: , Rfl:    methylPREDNISolone (MEDROL DOSEPAK) 4 MG TBPK tablet, Take as directed, Disp: 21 tablet, Rfl: 0  EXAM:  BP Readings from Last 3 Encounters:  07/01/22 110/70  02/01/22 129/69  01/20/22 (!) 120/45    VITALS per patient if applicable:  GENERAL: alert, oriented, appears well and in no acute distress  HEENT: atraumatic, conjunttiva clear, no obvious abnormalities on inspection of external nose and ears  NECK: normal movements of the head and neck  LUNGS: on inspection no signs of  respiratory distress, breathing rate appears normal, no obvious gross SOB, gasping or wheezing  CV: no obvious cyanosis   PSYCH/NEURO:alert   verbal speech and thought processing grossly intact Lab Results  Component Value Date   WBC 9.3 01/11/2022   HGB 10.0 (L) 01/11/2022   HCT 30.8 (L) 01/11/2022   PLT 278 01/11/2022   GLUCOSE 88 01/11/2022   CHOL 152 08/04/2021   TRIG 61.0 08/04/2021   HDL 72.70 08/04/2021   LDLCALC 67 08/04/2021   ALT 21 01/04/2022   AST 31 01/04/2022   NA 136 01/11/2022   K 3.9 01/11/2022   CL 105 01/11/2022   CREATININE 0.55 01/11/2022   BUN 15 01/11/2022   CO2 24 01/11/2022   TSH 1.58 08/04/2021   INR 1.0 04/13/2021   HGBA1C 5.7 08/04/2021    ASSESSMENT AND PLAN:  Discussed the following assessment and plan:    ICD-10-CM   1. Back pain with left-sided radiculopathy  M54.10 Ambulatory referral to Neurosurgery    2. Acute left-sided low back pain with bilateral sciatica  M54.42 methylPREDNISolone (MEDROL DOSEPAK) 4 MG TBPK tablet   M54.41     3. History of back surgery  Z98.890 Ambulatory referral to Neurosurgery    4. Lumbar post-laminectomy syndrome  M96.1 Ambulatory referral to Neurosurgery    5. Colostomy in place [facility])  Z93.3      Agree sounds  mechanical nerve related  .  No signs of complications at this time. Advise  recourse with steroid   ( 7 days  can do longer if needed ) Refer back to NS group that did surgery. In past Counseled.   Expectant management and discussion of plan and treatment with opportunity to ask questions and all were answered. The patient agreed with the plan and demonstrated an understanding of the instructions.   Advised to call back or seek an in-person evaluation if worsening  or having  further concerns  in interim. Return if symptoms worsen or fail to improve in interim.    Shanon Ace, MD

## 2022-08-22 DIAGNOSIS — G4733 Obstructive sleep apnea (adult) (pediatric): Secondary | ICD-10-CM | POA: Diagnosis not present

## 2022-08-24 ENCOUNTER — Encounter: Payer: Self-pay | Admitting: Internal Medicine

## 2022-08-25 NOTE — Telephone Encounter (Signed)
So I advise  referral to [place] [facility]  Dr Ellene Route she was a patient of Dr Sherwood Gambler who dhas since retired)  Please do a referral with ? letter?  Not sure process  please refer.

## 2022-08-29 DIAGNOSIS — D126 Benign neoplasm of colon, unspecified: Secondary | ICD-10-CM | POA: Diagnosis not present

## 2022-08-29 DIAGNOSIS — Z933 Colostomy status: Secondary | ICD-10-CM | POA: Diagnosis not present

## 2022-08-30 ENCOUNTER — Telehealth: Payer: Self-pay | Admitting: Gastroenterology

## 2022-08-30 NOTE — Telephone Encounter (Signed)
Inbound call from patient stating that she received a letter in the mail that she need to schedule an office visit to further discuss having a colonoscopy with Dr. Tarri Glenn. Patient stated that on 10/23 Dr. Thermon Leyland is going to do surgery to reconnect her colon due to her having a ostomy. Patient stated that Dr. Thermon Leyland advised that she have a colonoscopy before having procedure in October. Patient is seeking advice on what she needs to do moving forward. Please advise.

## 2022-08-31 DIAGNOSIS — Z933 Colostomy status: Secondary | ICD-10-CM | POA: Diagnosis not present

## 2022-08-31 DIAGNOSIS — K631 Perforation of intestine (nontraumatic): Secondary | ICD-10-CM | POA: Diagnosis not present

## 2022-08-31 NOTE — Telephone Encounter (Signed)
Patient has been scheduled for PV on 09/02/22 at 11:00 am & colon on 09/15/22 at 1:30 pm with Dr. Tarri Glenn. Per Dr. Tarri Glenn she will need both prep & fleet enema's given that she has an ostomy. Pt is not diabetic or on any blood thinners. She states she only takes a baby aspirin, and can come off of it at anytime. Pt instructed on where to go for PV, no further questions.

## 2022-08-31 NOTE — Telephone Encounter (Signed)
Patient called back & rescheduled colon for 09/09/22 at 2:30 pm.

## 2022-09-02 ENCOUNTER — Ambulatory Visit (AMBULATORY_SURGERY_CENTER): Payer: Self-pay

## 2022-09-02 ENCOUNTER — Encounter: Payer: Self-pay | Admitting: Gastroenterology

## 2022-09-02 VITALS — Ht 64.0 in | Wt 251.0 lb

## 2022-09-02 DIAGNOSIS — Z86010 Personal history of colonic polyps: Secondary | ICD-10-CM

## 2022-09-02 MED ORDER — PEG 3350-KCL-NA BICARB-NACL 420 G PO SOLR: 4000.0000 mL | Freq: Once | ORAL | 0 refills | Status: AC

## 2022-09-02 NOTE — Progress Notes (Signed)
No egg or soy allergy known to patient  No issues known to pt with past sedation with any surgeries or procedures Patient denies ever being told they had issues or difficulty with intubation  No FH of Malignant Hyperthermia Pt is not on diet pills Pt is not on  home 02  Pt is not on blood thinners  Pt denies issues with constipation  No A fib or A flutter Have any cardiac testing pending--no Pt instructed to use Single[employer]com or GoodRx for a price reduction on prep   

## 2022-09-07 NOTE — Progress Notes (Signed)
Sent message, via epic in basket, requesting orders in epic from surgeon.  

## 2022-09-08 ENCOUNTER — Ambulatory Visit: Payer: Self-pay | Admitting: Surgery

## 2022-09-08 DIAGNOSIS — Z01818 Encounter for other preprocedural examination: Secondary | ICD-10-CM

## 2022-09-09 ENCOUNTER — Ambulatory Visit (AMBULATORY_SURGERY_CENTER): Payer: Medicare Other | Admitting: Gastroenterology

## 2022-09-09 ENCOUNTER — Encounter: Payer: Self-pay | Admitting: Gastroenterology

## 2022-09-09 VITALS — BP 126/85 | HR 76 | Temp 98.0°F | Resp 13 | Ht 64.0 in | Wt 251.0 lb

## 2022-09-09 DIAGNOSIS — Z86010 Personal history of colonic polyps: Secondary | ICD-10-CM

## 2022-09-09 DIAGNOSIS — Z1211 Encounter for screening for malignant neoplasm of colon: Secondary | ICD-10-CM | POA: Diagnosis not present

## 2022-09-09 DIAGNOSIS — D122 Benign neoplasm of ascending colon: Secondary | ICD-10-CM

## 2022-09-09 DIAGNOSIS — D120 Benign neoplasm of cecum: Secondary | ICD-10-CM | POA: Diagnosis not present

## 2022-09-09 DIAGNOSIS — Z09 Encounter for follow-up examination after completed treatment for conditions other than malignant neoplasm: Secondary | ICD-10-CM | POA: Diagnosis not present

## 2022-09-09 DIAGNOSIS — K635 Polyp of colon: Secondary | ICD-10-CM | POA: Diagnosis not present

## 2022-09-09 MED ORDER — SODIUM CHLORIDE 0.9 % IV SOLN: 500.0000 mL | Freq: Once | INTRAVENOUS | Status: DC

## 2022-09-09 MED ORDER — FLEET ENEMA 7-19 GM/118ML RE ENEM
1.0000 | ENEMA | Freq: Once | RECTAL | Status: AC
  Administered 2022-09-09: 1 via RECTAL

## 2022-09-09 NOTE — Patient Instructions (Signed)
    Handout on polyps given to you today  Await pathology results on polyps removed    YOU HAD AN ENDOSCOPIC PROCEDURE TODAY AT THE [facility]:   Refer to the procedure report that was given to you for any specific questions about what was found during the examination.  If the procedure report does not answer your questions, please call your gastroenterologist to clarify.  If you requested that your care partner not be given the details of your procedure findings, then the procedure report has been included in a sealed envelope for you to review at your convenience later.  YOU SHOULD EXPECT: Some feelings of bloating in the abdomen. Passage of more gas than usual.  Walking can help get rid of the air that was put into your GI tract during the procedure and reduce the bloating. If you had a lower endoscopy (such as a colonoscopy or flexible sigmoidoscopy) you may notice spotting of blood in your stool or on the toilet paper. If you underwent a bowel prep for your procedure, you may not have a normal bowel movement for a few days.  Please Note:  You might notice some irritation and congestion in your nose or some drainage.  This is from the oxygen used during your procedure.  There is no need for concern and it should clear up in a day or so.  SYMPTOMS TO REPORT IMMEDIATELY:  Following lower endoscopy (colonoscopy or flexible sigmoidoscopy):  Excessive amounts of blood in the stool  Significant tenderness or worsening of abdominal pains  Swelling of the abdomen that is new, acute  Fever of 100F or higher   For urgent or emergent issues, a gastroenterologist can be reached at any hour by calling [phone]. Do not use MyChart messaging for urgent concerns.    DIET:  We do recommend a small meal at first, but then you may proceed to your regular diet.  Drink plenty of fluids but you should avoid alcoholic beverages for 24 hours.  ACTIVITY:  You should plan to take it easy for  the rest of today and you should NOT DRIVE or use heavy machinery until tomorrow (because of the sedation medicines used during the test).    FOLLOW UP: Our staff will call the number listed on your records the next business day following your procedure.  We will call around 7:15- 8:00 am to check on you and address any questions or concerns that you may have regarding the information given to you following your procedure. If we do not reach you, we will leave a message.     If any biopsies were taken you will be contacted by phone or by letter within the next 1-3 weeks.  Please call us at [phone] if you have not heard about the biopsies in 3 weeks.    SIGNATURES/CONFIDENTIALITY: You and/or your care partner have signed paperwork which will be entered into your electronic medical record.  These signatures attest to the fact that that the information above on your After Visit Summary has been reviewed and is understood.  Full responsibility of the confidentiality of this discharge information lies with you and/or your care-partner. 

## 2022-09-09 NOTE — Progress Notes (Signed)
VS by AS   Pt's states no medical or surgical changes since previsit or office visit.  Pt unable to complete enema at home. Enema administered in pre-op.

## 2022-09-09 NOTE — Progress Notes (Signed)
Referring Provider: Burnis Medin, MD Primary Care Physician:  Burnis Medin, MD   Indication for Colonoscopy:  Colon cancer surveillance   IMPRESSION:  Need for colon cancer surveillance prior to ostomy takedown Appropriate candidate for monitored anesthesia care  PLAN: Colonoscopy in the [place] today   HPI: Terri Rodriguez is a 76 y.o. female presents for surveillance colonoscopy.  She had left colectomy with transverse colostomy on 01/03/22 for ischemic perforation likely related to medications, hypotension, and ischemia.  She returns today for colonoscopy planned prior to her ostomy takedown.  Her ostomy has been functioning well.  Prior colonoscopies in 1995, 2001, 2007, 2009, 2011, 2015, and 2020 She has a history of a tubular adenoma and sessile serrated adenoma in 2007,  a tubular adenoma in 2009, and 5 tubular adenomas 2020.  Surveillance colonoscopy recommended in 3 years.  No known family history of colon cancer or polyps. No family history of uterine/endometrial cancer, pancreatic cancer or gastric/stomach cancer.   Past Medical History:  Diagnosis Date   ALLERGIC RHINITIS    Allergy    Anginal pain ([place])    Blood transfusion without reported diagnosis    Cataract    small right   Cholelithiasis 06/09/2008   gallstones 2009 on ct scan   Complication of anesthesia 2012   went to ICU after bilateral knees , unstable vital signs oct 2012, has surgery since did ok   Coronary artery disease    30 % narrowing lad   Diverticulitis    EPICONDYLITIS, LATERAL    osteoarthritis, previous joint replacements   Fall 10/2018   GERD    Hepatic hemangioma 06/09/2008   Hiatal hernia 03/2000   Hx of adenomatous polyp of colon 06/05/2008   Hx of cardiac catheterization 2008   clean coronarys [facility]    Hx of chest pain 2003   neg cath remot hx of narrowwing lad in 2003 nl 2008, none in many years   HYPERLIPIDEMIA    IBS (irritable bowel syndrome)    LEUKOPENIA, CHRONIC     Mononucleosis 1963   RAYNAUD'S SYNDROME, HX OF    improved after cardiac meds initiated   Rosacea    facial   Sleep apnea    SLEEP APNEA, OBSTRUCTIVE    cpap, settings "automatic" settings 11-12   Subacute thyroiditis    UNSPECIFIED ANEMIA     Past Surgical History:  Procedure Laterality Date   ABDOMINAL HYSTERECTOMY  1993   bso   BACK SURGERY  2008   l 3 to l4 l4 to l5   BARTHOLIN GLAND CYST EXCISION Right 09/09/2019   Procedure: EXCISION OF VULVAR MASS TIMES TWO;  Surgeon: Megan Salon, MD;  Location: [facility];  Service: Gynecology;  Laterality: Right;   CARDIAC CATHETERIZATION  04/07/2007   Noncritical coronary artery disease. Contiue medical therapy.   CARDIAC CATHETERIZATION  12/03/2007   Normal LV function. Mild angiographic mitral valve prolapse. Normal coronary arteries.   CARDIOVASCULAR STRESS TEST  11/09/2007   Moderate ischemia in Mid Anterior, Mid Anteroseptal, Apical Anterior, and Apical Septal regions. EKG negative for ischemia.   [place] RESECTION Left 01/03/2022   Procedure: COLON RESECTION;  Surgeon: Felicie Morn, MD;  Location: [facility];  Service: General;  Laterality: Left;   COLOSTOMY  01/03/2022   Procedure: COLOSTOMY;  Surgeon: Felicie Morn, MD;  Location: [facility];  Service: General;;   dental implants  11/2002,11/2011,11/2012   dental implants  x 6   DENTAL SURGERY     DILATION AND CURETTAGE OF UTERUS     d and e 1988 and 1990   JOINT REPLACEMENT  09/21/2011   bilateral knee   KNEE ARTHROSCOPY     bilateral   LAPAROTOMY N/A 01/03/2022   Procedure: EXPLORATORY LAPAROTOMY;  Surgeon: Felicie Morn, MD;  Location: [facility];  Service: General;  Laterality: N/A;   lumbar surgery fixation with disc replacement  10/08   Left   NASAL SEPTOPLASTY W/ TURBINOPLASTY  05/2000   NOCTURNAL POLYSOMNOGRAM  12/31/2006   Severe obstructive sleep apnea. AHI-87/hr   REPLACEMENT TOTAL KNEE  2012    bilateral   TONSILLECTOMY  1952   adenoids too   TOTAL HIP ARTHROPLASTY Left 10/21/2013   Procedure: LEFT TOTAL HIP ARTHROPLASTY ANTERIOR APPROACH;  Surgeon: [name], MD;  Location: [facility];  Service: Orthopedics;  Laterality: Left;   TOTAL HIP ARTHROPLASTY Right 07/16/2014   Procedure: RIGHT TOTAL HIP ARTHROPLASTY ANTERIOR APPROACH;  Surgeon: [name], MD;  Location: [facility];  Service: Orthopedics;  Laterality: Right;   TRANSTHORACIC ECHOCARDIOGRAM  11/09/2007   EF 40%, LV systolic function normal. Mild aortic root dilation.   WRIST SURGERY Left 04/03/14   ORIF "distal radial head fracture"    Current Outpatient Medications  Medication Sig Dispense Refill   acetaminophen (TYLENOL) 500 MG tablet Take 1 tablet (500 mg total) by mouth every 6 (six) hours as needed for fever or mild pain. 30 tablet 0   amLODipine (NORVASC) 5 MG tablet TAKE 1 TABLET BY MOUTH EVERY DAY 90 tablet 3   aspirin (ASPIRIN 81) 81 MG chewable tablet Orally     atorvastatin (LIPITOR) 20 MG tablet TAKE 1 TABLET BY MOUTH EVERY DAY AT 6 PM 90 tablet 2   Azelaic Acid 15 % cream Apply 1 application topically 2 (two) times daily as needed (rosacea). After skin is thoroughly washed and patted dry, gently but thoroughly massage a thin film of azelaic acid cream into the affected area t daily,after shower.     carvedilol (COREG) 3.125 MG tablet TAKE 1 TABLET BY MOUTH 2 TIMES DAILY WITH A meal 180 tablet 3   chlorhexidine (PERIDEX) 0.12 % solution      DULoxetine (CYMBALTA) 60 MG capsule TAKE 1 CAPSULE BY MOUTH EVERY DAY 90 capsule 1   estradiol (ESTRACE) 0.5 MG tablet 1/2 tab daily (Patient taking differently: Take 0.25 mg by mouth every other day.) 45 tablet 4   fexofenadine (ALLEGRA) 180 MG tablet Take 180 mg by mouth daily.     metroNIDAZOLE (METROGEL) 9.81 % gel 1 application daily as needed.     omeprazole (PRILOSEC) 20 MG capsule Take 40 mg by mouth daily.     ramipril (ALTACE) 10 MG capsule TAKE 1 CAPSULE BY  MOUTH EVERY DAY 90 capsule 3   amoxicillin (AMOXIL) 500 MG capsule Take 2,000 mg by mouth See admin instructions. 1 hour prior to dental procedures.     clindamycin (CLEOCIN T) 1 % external solution Apply topically 2 (two) times daily.     clobetasol (TEMOVATE) 0.05 % external solution Apply 1 application topically daily as needed (itching).     diclofenac Sodium (VOLTAREN) 1 % GEL      fluocinonide (LIDEX) 0.05 % external solution 1 application daily as needed (scalp).  0   ketoconazole (NIZORAL) 2 % cream 1 application daily as needed for irritation.     nitroGLYCERIN (NITROSTAT) 0.4 MG SL tablet PLACE 1 TABLET  UNDER THE TONGUE EVERY 5 MINUTES AS NEEDED FOR CHEST PAIN (Patient taking differently: 0.4 mg every 5 (five) minutes as needed for chest pain.) 25 tablet 3   psyllium (REGULOID) 0.52 g capsule Take 0.52 g by mouth 2 (two) times daily.     valACYclovir (VALTREX) 1000 MG tablet TAKE 2 TABLET BY MOUTH Q12 hours X 2 DOSES WITH FEVER BLISTERS (Patient taking differently: Take 1,000 mg by mouth daily as needed (breakouts).) 30 tablet 1   Current Facility-Administered Medications  Medication Dose Route Frequency Provider Last Rate Last Admin   0.9 %  sodium chloride infusion  500 mL Intravenous Once Thornton Park, MD        Allergies as of 09/09/2022 - Review Complete 09/09/2022  Allergen Reaction Noted   Codeine  12/02/2013   Crab [shellfish allergy] Itching and Nausea And Vomiting 10/10/2013    Family History  Problem Relation Age of Onset   Rheum arthritis Mother    Diabetes Mother    Heart failure Mother    Thrombocytopenia Mother    Sleep apnea Mother    Ulcers Mother        PUD   Deep vein thrombosis Father    Pulmonary embolism Father    Osteoarthritis Father    Atrial fibrillation Father    Dementia Father    Sleep apnea Father    Sleep apnea Brother    Obesity Brother    Sleep apnea Brother    Obesity Brother    Atrial fibrillation Brother    Colon cancer  Maternal Aunt 59   Colon cancer Maternal Aunt 90   Congenital adrenal hyperplasia Grandchild    Pancreatic cancer Other    Uterine cancer Other    Leukemia Other    Inflammatory bowel disease Other        aunt   Esophageal cancer Neg Hx    Rectal cancer Neg Hx    Stomach cancer Neg Hx    Colon polyps Neg Hx      Physical Exam: General:   Alert,  well-nourished, pleasant and cooperative in NAD Head:  Normocephalic and atraumatic. Eyes:  Sclera clear, no icterus.   Conjunctiva pink. Mouth:  No deformity or lesions.   Neck:  Supple; no masses or thyromegaly. Lungs:  Clear throughout to auscultation.   No wheezes. Heart:  Regular rate and rhythm; no murmurs. Abdomen:  Soft, non-tender, nondistended, normal bowel sounds, no rebound or guarding.  Msk:  Symmetrical. No boney deformities LAD: No inguinal or umbilical LAD Extremities:  No clubbing or edema. Neurologic:  Alert and  oriented x4;  grossly nonfocal Skin:  No obvious rash or bruise. Psych:  Alert and cooperative. Normal mood and affect.     Studies/Results: No results found.    Alynna Hargrove L. Tarri Glenn, MD, MPH 09/09/2022, 2:59 PM

## 2022-09-09 NOTE — Progress Notes (Signed)
Called to room to assist during endoscopic procedure.  Patient ID and intended procedure confirmed with present staff. Received instructions for my participation in the procedure from the performing physician.  

## 2022-09-09 NOTE — Progress Notes (Signed)
PT taken to PACU. Monitors in place. VSS. Report given to RN. 

## 2022-09-09 NOTE — Op Note (Signed)
[facility] Patient Name: Terri Rodriguez Procedure Date: 09/09/2022 3:05 PM MRN: [id number] Endoscopist: Thornton Park MD, MD Age: 76 Referring MD:  Date of Birth: Apr 24, 1946 Gender: Female Account #: [id number] Procedure:                Colonoscopy Indications:              Surveillance: Personal history of adenomatous                            polyps on last colonoscopy 3 years ago                           Left colectomy with transverse colostomy on 01/03/22                            for ischemic perforation. She returns today for                            colonoscopy planned prior to her ostomy takedown.                            Her ostomy has been functioning well.                           Prior colonoscopies in 1995, 2001, 2007, 2009,                            2011, 2015, and 2020                           She has a history of a tubular adenoma and sessile                            serrated adenoma in 2007, a tubular adenoma in                            2009, and 5 tubular adenomas 2020. Surveillance                            colonoscopy recommended in 3 years. Medicines:                Monitored Anesthesia Care Procedure:                Pre-Anesthesia Assessment:                           - Prior to the procedure, a History and Physical                            was performed, and patient medications and                            allergies were reviewed. The patient's tolerance of  previous anesthesia was also reviewed. The risks                            and benefits of the procedure and the sedation                            options and risks were discussed with the patient.                            All questions were answered, and informed consent                            was obtained. Prior Anticoagulants: The patient has                            taken no previous anticoagulant or antiplatelet                             agents. ASA Grade Assessment: III - A patient with                            severe systemic disease. After reviewing the risks                            and benefits, the patient was deemed in                            satisfactory condition to undergo the procedure.                           After obtaining informed consent, the colonoscope                            was passed under direct vision. Throughout the                            procedure, the patient's blood pressure, pulse, and                            oxygen saturations were monitored continuously. The                            Olympus PCF-H190DL ([id number]) Colonoscope was                            introduced through the transverse colostomy and                            advanced to the the cecum, identified by                            appendiceal orifice and ileocecal valve. After  evaluation of the right colon, the colonoscope was                            withdrawn. The colonoscope was then introduce                            through the anus and advanced through the left                            colon. The colonoscopy was performed without                            difficulty. The patient tolerated the procedure                            well. The quality of the bowel preparation was                            good. The ileocecal valve, appendiceal orifice, and                            rectum were photographed. Scope In: 3:22:40 PM Scope Out: 3:38:26 PM Scope Withdrawal Time: 0 hours 12 minutes 56 seconds  Total Procedure Duration: 0 hours 15 minutes 46 seconds  Findings:                 There was evidence of a widely patent end colostomy                            in the transverse colon. This was characterized by                            healthy appearing mucosa.                           A 3 mm polyp was found in the ascending colon. The                             polyp was sessile. The polyp was removed with a                            cold snare. Resection and retrieval were complete.                            Estimated blood loss was minimal.                           A 4 mm polyp was found in the cecum. The polyp was                            sessile. The polyp was removed with a cold snare.  Resection and retrieval were complete.                           The perianal and digital rectal examinations were                            normal. The visualized left colon appeared normal.                            Formed stool limited evaluation of the most                            proximal portion of the left colon. Complications:            No immediate complications. Estimated Blood Loss:     Estimated blood loss was minimal. Impression:               - Widely patent end colostomy with healthy                            appearing mucosa in the transverse colon.                           - One 3 mm polyp in the ascending colon, removed                            with a cold snare. Resected and retrieved.                           - One 4 mm polyp in the cecum, removed with a cold                            snare. Resected and retrieved. Recommendation:           - Patient has a contact number available for                            emergencies. The signs and symptoms of potential                            delayed complications were discussed with the                            patient. Return to normal activities tomorrow.                            Written discharge instructions were provided to the                            patient.                           - Resume previous diet.                           - Continue present  medications.                           - Await pathology results.                           - Repeat colonoscopy date to be determined after                            pending pathology  results are reviewed for                            surveillance.                           - Emerging evidence supports eating a diet of                            fruits, vegetables, grains, calcium, and yogurt                            while reducing red meat and alcohol may reduce the                            risk of colon cancer. Thornton Park MD, MD 09/09/2022 3:55:53 PM This report has been signed electronically.

## 2022-09-12 ENCOUNTER — Telehealth: Payer: Self-pay

## 2022-09-12 NOTE — Telephone Encounter (Signed)
  Follow up Call-     09/09/2022    2:19 PM  Call back number  Post procedure Call Back phone  # [phone]  Permission to leave phone message Yes     Patient questions:  Do you have a fever, pain , or abdominal swelling? No. Pain Score  0 *  Have you tolerated food without any problems? Yes.    Have you been able to return to your normal activities? Yes.    Do you have any questions about your discharge instructions: Diet   No. Medications  No. Follow up visit  No.  Do you have questions or concerns about your Care? No.  Actions: * If pain score is 4 or above: No action needed, pain <4.

## 2022-09-13 NOTE — Patient Instructions (Signed)
SURGICAL WAITING ROOM VISITATION Patients having surgery or a procedure may have no more than 2 support people in the waiting area - these visitors may rotate.   Children under the age of 57 must have an adult with them who is not the patient. If the patient needs to stay at the hospital during part of their recovery, the visitor guidelines for inpatient rooms apply. Pre-op nurse will coordinate an appropriate time for 1 support person to accompany patient in pre-op.  This support person may not rotate.    Please refer to the [facility] website for the visitor guidelines for Inpatients (after your surgery is over and you are in a regular room).    Your procedure is scheduled on: 09/26/22   Report to [facility] Main Entrance    Report to admitting at 5:15 AM   Call this number if you have problems the morning of surgery [phone]   Follow clear liquid diet the day before surgery   You may have the following liquids until 4:30 AM DAY OF SURGERY  Water Non-Citrus Juices (without pulp, NO RED) Carbonated Beverages Black Coffee (NO MILK/CREAM OR CREAMERS, sugar ok)  Clear Tea (NO MILK/CREAM OR CREAMERS, sugar ok) regular and decaf                             Plain Jell-O (NO RED)                                           Fruit ices (not with fruit pulp, NO RED)                                     Popsicles (NO RED)                                                               Sports drinks like Gatorade (NO RED)              Drink 2 Ensure drinks AT 10:00 PM the night before surgery.        The day of surgery:  Drink ONE (1) Pre-Surgery Clear Ensure at 4:30 AM the morning of surgery. Drink in one sitting. Do not sip.  This drink was given to you during your hospital  pre-op appointment visit. Nothing else to drink after completing the  Pre-Surgery Clear Ensure.          If you have questions, please contact your surgeon's office.   FOLLOW BOWEL PREP AND ANY  ADDITIONAL PRE OP INSTRUCTIONS YOU RECEIVED FROM YOUR SURGEON'S OFFICE!!!     Oral Hygiene is also important to reduce your risk of infection.                                    Remember - BRUSH YOUR TEETH THE MORNING OF SURGERY WITH YOUR REGULAR TOOTHPASTE   Take these medicines the morning of surgery with A SIP OF WATER: Tylenol, Amlodipine, Carvedilol, Omeprazole  Bring CPAP mask and tubing day of surgery.                              You may not have any metal on your body including hair pins, jewelry, and body piercing             Do not wear make-up, lotions, powders, perfumes, or deodorant  Do not wear nail polish including gel and S&S, artificial/acrylic nails, or any other type of covering on natural nails including finger and toenails. If you have artificial nails, gel coating, etc. that needs to be removed by a nail salon please have this removed prior to surgery or surgery may need to be canceled/ delayed if the surgeon/ anesthesia feels like they are unable to be safely monitored.   Do not shave  48 hours prior to surgery.    Do not bring valuables to the hospital. [place].   Bring small overnight bag day of surgery.   DO NOT [place]. PHARMACY WILL DISPENSE MEDICATIONS LISTED ON YOUR MEDICATION LIST TO YOU DURING YOUR ADMISSION [place]!               Please read over the following fact sheets you were given: IF [place] [phone] Schneiders   If you received a COVID test during your pre-op visit  it is requested that you wear a mask when out in public, stay away from anyone that may not be feeling well and notify your surgeon if you develop symptoms. If you test positive for Covid or have been in contact with anyone that has tested positive in the last 10 days please notify you surgeon.     [facility] [place] - Preparing for  Surgery Before surgery, you can play an important role.  Because skin is not sterile, your skin needs to be as free of germs as possible.  You can reduce the number of germs on your skin by washing with CHG (chlorahexidine gluconate) soap before surgery.  CHG is an antiseptic cleaner which kills germs and bonds with the skin to continue killing germs even after washing. Please DO NOT use if you have an allergy to CHG or antibacterial soaps.  If your skin becomes reddened/irritated stop using the CHG and inform your nurse when you arrive at Short Stay. Do not shave (including legs and underarms) for at least 48 hours prior to the first CHG shower.  You may shave your face/neck.  Please follow these instructions carefully:  1.  Shower with CHG Soap the night before surgery and the  morning of surgery.  2.  If you choose to wash your hair, wash your hair first as usual with your normal  shampoo.  3.  After you shampoo, rinse your hair and body thoroughly to remove the shampoo.                             4.  Use CHG as you would any other liquid soap.  You can apply chg directly to the skin and wash.  Gently with a scrungie or clean washcloth.  5.  Apply the CHG Soap to your body ONLY FROM THE NECK DOWN.   Do  not use on face/ open                           Wound or open sores. Avoid contact with eyes, ears mouth and   genitals (private parts).                       Wash face,  Genitals (private parts) with your normal soap.             6.  Wash thoroughly, paying special attention to the area where your    surgery  will be performed.  7.  Thoroughly rinse your body with warm water from the neck down.  8.  DO NOT shower/wash with your normal soap after using and rinsing off the CHG Soap.                9.  Pat yourself dry with a clean towel.            10.  Wear clean pajamas.            11.  Place clean sheets on your bed the night of your first shower and do not  sleep with pets. Day of Surgery  : Do not apply any lotions/deodorants the morning of surgery.  Please wear clean clothes to the hospital/surgery center.  FAILURE TO FOLLOW THESE INSTRUCTIONS MAY RESULT IN THE CANCELLATION OF YOUR SURGERY  PATIENT SIGNATURE_________________________________  NURSE SIGNATURE__________________________________  ________________________________________________________________________   Terri Rodriguez  An incentive spirometer is a tool that can help keep your lungs clear and active. This tool measures how well you are filling your lungs with each breath. Taking long deep breaths may help reverse or decrease the chance of developing breathing (pulmonary) problems (especially infection) following: A long period of time when you are unable to move or be active. BEFORE THE PROCEDURE  If the spirometer includes an indicator to show your best effort, your nurse or respiratory therapist will set it to a desired goal. If possible, sit up straight or lean slightly forward. Try not to slouch. Hold the incentive spirometer in an upright position. INSTRUCTIONS FOR USE  Sit on the edge of your bed if possible, or sit up as far as you can in bed or on a chair. Hold the incentive spirometer in an upright position. Breathe out normally. Place the mouthpiece in your mouth and seal your lips tightly around it. Breathe in slowly and as deeply as possible, raising the piston or the ball toward the top of the column. Hold your breath for 3-5 seconds or for as long as possible. Allow the piston or ball to fall to the bottom of the column. Remove the mouthpiece from your mouth and breathe out normally. Rest for a few seconds and repeat Steps 1 through 7 at least 10 times every 1-2 hours when you are awake. Take your time and take a few normal breaths between deep breaths. The spirometer may include an indicator to show your best effort. Use the indicator as a goal to work toward during each repetition. After  each set of 10 deep breaths, practice coughing to be sure your lungs are clear. If you have an incision (the cut made at the time of surgery), support your incision when coughing by placing a pillow or rolled up towels firmly against it. Once you are able to get out of bed, walk around indoors and cough well. You may  stop using the incentive spirometer when instructed by your caregiver.  RISKS AND COMPLICATIONS Take your time so you do not get dizzy or light-headed. If you are in pain, you may need to take or ask for pain medication before doing incentive spirometry. It is harder to take a deep breath if you are having pain. AFTER USE Rest and breathe slowly and easily. It can be helpful to keep track of a log of your progress. Your caregiver can provide you with a simple table to help with this. If you are using the spirometer at home, follow these instructions: [place] IF:  You are having difficultly using the spirometer. You have trouble using the spirometer as often as instructed. Your pain medication is not giving enough relief while using the spirometer. You develop fever of 100.5 F (38.1 C) or higher. SEEK IMMEDIATE MEDICAL CARE IF:  You cough up bloody sputum that had not been present before. You develop fever of 102 F (38.9 C) or greater. You develop worsening pain at or near the incision site. MAKE SURE YOU:  Understand these instructions. Will watch your condition. Will get help right away if you are not doing well or get worse. Document Released: 04/03/2007 Document Revised: 02/13/2012 Document Reviewed: 06/04/2007 [facility] Patient Information 2014 [place], [place].   ________________________________________________________________________

## 2022-09-13 NOTE — Progress Notes (Signed)
COVID Vaccine Completed: yes  Date of COVID positive in last 90 days:  PCP - Shanon Ace, MD Cardiologist - Shelva Majestic, MD  Chest x-ray -  EKG - 01/05/22 Epic Stress Test - 2008 ECHO - 03/07/16 Epic Cardiac Cath - 2008 Pacemaker/ICD device last checked: Spinal Cord Stimulator:  Bowel Prep -   Sleep Study -  CPAP -   Fasting Blood Sugar -  Checks Blood Sugar _____ times a day  Blood Thinner Instructions: Aspirin Instructions: ASA 81 Last Dose:  Activity level:  Can go up a flight of stairs and perform activities of daily living without stopping and without symptoms of chest pain or shortness of breath.  Able to exercise without symptoms  Unable to go up a flight of stairs without symptoms of     Anesthesia review: mitral valve prolapse, CAD  Patient denies shortness of breath, fever, cough and chest pain at PAT appointment  Patient verbalized understanding of instructions that were given to them at the PAT appointment. Patient was also instructed that they will need to review over the PAT instructions again at home before surgery.

## 2022-09-14 ENCOUNTER — Encounter (HOSPITAL_COMMUNITY)
Admission: RE | Admit: 2022-09-14 | Discharge: 2022-09-14 | Disposition: A | Discharge status: Home / Self Care | Payer: Medicare Other | Source: Ambulatory Visit | Attending: Surgery | Admitting: Surgery

## 2022-09-14 ENCOUNTER — Encounter (HOSPITAL_COMMUNITY): Payer: Self-pay

## 2022-09-14 VITALS — BP 136/68 | HR 87 | Temp 98.2°F | Resp 18 | Ht 64.0 in | Wt 249.0 lb

## 2022-09-14 DIAGNOSIS — Z01812 Encounter for preprocedural laboratory examination: Secondary | ICD-10-CM | POA: Diagnosis not present

## 2022-09-14 DIAGNOSIS — I341 Nonrheumatic mitral (valve) prolapse: Secondary | ICD-10-CM | POA: Diagnosis not present

## 2022-09-14 DIAGNOSIS — I2510 Atherosclerotic heart disease of native coronary artery without angina pectoris: Secondary | ICD-10-CM | POA: Insufficient documentation

## 2022-09-14 DIAGNOSIS — Z01818 Encounter for other preprocedural examination: Secondary | ICD-10-CM

## 2022-09-14 LAB — CBC
HCT: 36.5 % (ref 36.0–46.0)
Hemoglobin: 12.2 g/dL (ref 12.0–15.0)
MCH: 32.7 pg (ref 26.0–34.0)
MCHC: 33.4 g/dL (ref 30.0–36.0)
MCV: 97.9 fL (ref 80.0–100.0)
Platelets: 216 10*3/uL (ref 150–400)
RBC: 3.73 MIL/uL — ABNORMAL LOW (ref 3.87–5.11)
RDW: 14.1 % (ref 11.5–15.5)
WBC: 3.5 10*3/uL — ABNORMAL LOW (ref 4.0–10.5)
nRBC: 0 % (ref 0.0–0.2)

## 2022-09-14 LAB — BASIC METABOLIC PANEL
Anion gap: 6 (ref 5–15)
BUN: 20 mg/dL (ref 8–23)
CO2: 28 mmol/L (ref 22–32)
Calcium: 9.5 mg/dL (ref 8.9–10.3)
Chloride: 106 mmol/L (ref 98–111)
Creatinine, Ser: 0.73 mg/dL (ref 0.44–1.00)
GFR, Estimated: >60 mL/min (ref >60)
Glucose, Bld: 106 mg/dL — ABNORMAL HIGH (ref 70–99)
Potassium: 5.1 mmol/L (ref 3.5–5.1)
Sodium: 140 mmol/L (ref 135–145)

## 2022-09-14 LAB — HEMOGLOBIN A1C
Hgb A1c MFr Bld: 5.3 % (ref 4.8–5.6)
Mean Plasma Glucose: 105.41 mg/dL

## 2022-09-15 ENCOUNTER — Encounter: Payer: Medicare Other | Admitting: Gastroenterology

## 2022-09-15 NOTE — Progress Notes (Signed)
Anesthesia Chart Review   Case: [id number] Date/Time: 09/26/22 0715   Procedure: ROBOTIC OSTOMY REVERSAL   Anesthesia type: General   Pre-op diagnosis: COLOSTOMY   Location[facility] ROOM 05 / [facility]   Surgeons: Felicie Morn, MD       DISCUSSION:76 y.o. never smoker with h/o sleep apnea w/cpap, CAD, colostomy in place  scheduled for above procedure 09/26/2022 with Dr. Louanna Raw.   Pt last seen by cardiology 12/13/2021. Stable at this visit with one year follow up recommended.   Anticipate pt can proceed with planned procedure barring acute status change.   VS: BP 136/68   Pulse 87   Temp 36.8 C (Oral)   Resp 18   Ht '5\' 4"'$  (1.626 m)   Wt 112.9 kg   SpO2 98%   BMI 42.74 kg/m   PROVIDERS: Panosh, Standley Brooking, MD is PCP   Cardiologist - Shelva Majestic, MD LABS: Labs reviewed: Acceptable for surgery. (all labs ordered are listed, but only abnormal results are displayed)  Labs Reviewed  BASIC METABOLIC PANEL - Abnormal; Notable for the following components:      Result Value   Glucose, Bld 106 (*)    All other components within normal limits  CBC - Abnormal; Notable for the following components:   WBC 3.5 (*)    RBC 3.73 (*)    All other components within normal limits  HEMOGLOBIN A1C     IMAGES:   EKG:   CV: Echo 03/07/2016 Study Conclusions   - Left ventricle: The cavity size was normal. Systolic function was    normal. The estimated ejection fraction was in the range of 55%    to 60%. Wall motion was normal; there were no regional wall    motion abnormalities. Left ventricular diastolic function    parameters were normal.  - Aortic valve: There was trivial regurgitation.  Past Medical History:  Diagnosis Date   ALLERGIC RHINITIS    Allergy    Anginal pain ([place])    Blood transfusion without reported diagnosis    Cataract    small right   Cholelithiasis 06/09/2008   gallstones 2009 on ct scan   Complication of anesthesia 2012   went to ICU after  bilateral knees , unstable vital signs oct 2012, has surgery since did ok   Coronary artery disease    30 % narrowing lad   Diverticulitis    EPICONDYLITIS, LATERAL    osteoarthritis, previous joint replacements   Fall 10/2018   GERD    Hepatic hemangioma 06/09/2008   Hiatal hernia 03/2000   Hx of adenomatous polyp of colon 06/05/2008   Hx of cardiac catheterization 2008   clean coronarys DrKelly    Hx of chest pain 2003   neg cath remot hx of narrowwing lad in 2003 nl 2008, none in many years   HYPERLIPIDEMIA    IBS (irritable bowel syndrome)    LEUKOPENIA, CHRONIC    Mononucleosis 1963   RAYNAUD'S SYNDROME, HX OF    improved after cardiac meds initiated   Rosacea    facial   Sleep apnea    SLEEP APNEA, OBSTRUCTIVE    cpap, settings "automatic" settings 11-12   Subacute thyroiditis    UNSPECIFIED ANEMIA     Past Surgical History:  Procedure Laterality Date   ABDOMINAL HYSTERECTOMY  1993   bso   BACK SURGERY  2008   l 3 to l4 l4 to l5   BARTHOLIN GLAND CYST EXCISION Right 09/09/2019  Procedure: EXCISION OF VULVAR MASS TIMES TWO;  Surgeon: Megan Salon, MD;  Location: [facility];  Service: Gynecology;  Laterality: Right;   CARDIAC CATHETERIZATION  04/07/2007   Noncritical coronary artery disease. Contiue medical therapy.   CARDIAC CATHETERIZATION  12/03/2007   Normal LV function. Mild angiographic mitral valve prolapse. Normal coronary arteries.   CARDIOVASCULAR STRESS TEST  11/09/2007   Moderate ischemia in Mid Anterior, Mid Anteroseptal, Apical Anterior, and Apical Septal regions. EKG negative for ischemia.   [place]   COLON RESECTION Left 01/03/2022   Procedure: COLON RESECTION;  Surgeon: Felicie Morn, MD;  [facility];  Service: General;  Laterality: Left;   COLOSTOMY  01/03/2022   Procedure: COLOSTOMY;  Surgeon: Felicie Morn, MD;  [facility];  Service: General;;   dental implants  11/2002,11/2011,11/2012    dental implants     x 6   [place]     d and e 1988 and [place]  09/21/2011   bilateral knee   KNEE ARTHROSCOPY     bilateral   LAPAROTOMY N/A 01/03/2022   Procedure: EXPLORATORY LAPAROTOMY;  Surgeon: Felicie Morn, MD;  [facility];  Service: General;  Laterality: N/A;   lumbar surgery fixation with disc replacement  10/08   Left   NASAL SEPTOPLASTY W/ TURBINOPLASTY  05/2000   NOCTURNAL POLYSOMNOGRAM  12/31/2006   Severe obstructive sleep apnea. AHI-87/hr   REPLACEMENT TOTAL KNEE  2012   bilateral   TONSILLECTOMY  1952   adenoids too   TOTAL HIP ARTHROPLASTY Left 10/21/2013   Procedure: LEFT TOTAL HIP ARTHROPLASTY ANTERIOR APPROACH;  Surgeon: [name], MD;  [facility];  Service: Orthopedics;  Laterality: Left;   TOTAL HIP ARTHROPLASTY Right 07/16/2014   Procedure: RIGHT TOTAL HIP ARTHROPLASTY ANTERIOR APPROACH;  Surgeon: [name], MD;  [facility];  Service: Orthopedics;  Laterality: Right;   TRANSTHORACIC ECHOCARDIOGRAM  11/09/2007   EF 76%, LV systolic function normal. Mild aortic root dilation.   WRIST SURGERY Left 04/03/14   ORIF "distal radial head fracture"    MEDICATIONS:  acetaminophen (TYLENOL) 500 MG tablet   amLODipine (NORVASC) 5 MG tablet   amoxicillin (AMOXIL) 500 MG capsule   aspirin EC 81 MG tablet   atorvastatin (LIPITOR) 20 MG tablet   Azelaic Acid 15 % cream   calcium elemental as carbonate (TUMS ULTRA 1000) 400 MG chewable tablet   carvedilol (COREG) 3.125 MG tablet   celecoxib (CELEBREX) 100 MG capsule   celecoxib (CELEBREX) 200 MG capsule   chlorhexidine (PERIDEX) 0.12 % solution   clindamycin (CLEOCIN T) 1 % external solution   clobetasol (TEMOVATE) 0.05 % external solution   diclofenac Sodium (VOLTAREN) 1 % GEL   DULoxetine (CYMBALTA) 60 MG capsule   estradiol (ESTRACE) 0.5 MG tablet   fexofenadine (ALLEGRA) 180 MG tablet   fluocinonide (LIDEX) 0.05 %  external solution   ketoconazole (NIZORAL) 2 % cream   metroNIDAZOLE (METROGEL) 0.75 % gel   Multiple Vitamin (MULTIVITAMIN WITH MINERALS) TABS tablet   nitroGLYCERIN (NITROSTAT) 0.4 MG SL tablet   omeprazole (PRILOSEC) 40 MG capsule   psyllium (REGULOID) 0.52 g capsule   ramipril (ALTACE) 10 MG capsule   valACYclovir (VALTREX) 1000 MG tablet   No current facility-administered medications for this encounter.    Konrad Felix Ward, PA-C [facility] [phone]

## 2022-09-16 ENCOUNTER — Telehealth: Payer: Self-pay | Admitting: Internal Medicine

## 2022-09-16 MED ORDER — VALACYCLOVIR HCL 1 G PO TABS: ORAL_TABLET | ORAL | 1 refills | Status: DC

## 2022-09-16 NOTE — Telephone Encounter (Signed)
Rx sent 

## 2022-09-16 NOTE — Telephone Encounter (Signed)
Pt called to request a refill of the valACYclovir (VALTREX) 1000 MG tablet Pt states it is medication to treat a herpes outbreak on her lip.  LOV:  07/01/22  [facility] - [place], [place] - [address] Phone:  [phone]  Fax:  [phone]

## 2022-09-21 DIAGNOSIS — G4733 Obstructive sleep apnea (adult) (pediatric): Secondary | ICD-10-CM | POA: Diagnosis not present

## 2022-09-21 DIAGNOSIS — Z1231 Encounter for screening mammogram for malignant neoplasm of breast: Secondary | ICD-10-CM | POA: Diagnosis not present

## 2022-09-21 LAB — HM MAMMOGRAPHY

## 2022-09-21 NOTE — Progress Notes (Signed)
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  Husband is at the beach, sick with COVID but she has avoided exposure.  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  She is comfortable and sleeping well with CPAP. Breathing is comfortable without routine cough or wheeze. Difficult year after hospitalization for bowel perforation and sepsis complicating diverticulosis.  Now pending reanastomosis of colostomy.  ROS-see HPI + = positive Constitutional:   No-   weight loss, night sweats, fevers, chills, fatigue, lassitude. HEENT:   No-  headaches, difficulty swallowing, tooth/dental problems, sore throat,       No-  sneezing, itching, ear ache, nasal congestion, post nasal drip,  CV:  No-   chest pain, orthopnea, PND, swelling in lower extremities, anasarca,                                                 dizziness, palpitations Resp: No-   shortness of breath with exertion or at rest.              No-   productive cough,  No non-productive cough,  No- coughing up of blood.              No-   change in color of mucus.  No- wheezing.   Skin:  No-   rash or lesions. GI:  No-   heartburn, indigestion, abdominal pain, nausea, vomiting,  GU:  MS:  +joint pain or swelling.  Neuro-     nothing unusual Psych:  No- change in mood or affect. No depression or anxiety.  No memory loss.  OBJ- Physical Exam General- Alert, Oriented, Affect-appropriate, Distress- none acute, +obese Skin- rash-none, lesions- none, excoriation- none Lymphadenopathy- none Head- atraumatic            Eyes- Gross vision intact, PERRLA, conjunctivae and secretions clear            Ears- Hearing, canals-normal            Nose- Clear, no-Septal dev, mucus, polyps, erosion, perforation             Throat- Mallampati III-IV , mucosa clear , drainage- none, tonsils- atrophic Neck- flexible , trachea midline, no stridor , thyroid nl, carotid no bruit Chest - symmetrical excursion , unlabored           Heart/CV- RRR , no murmur , no gallop  , no rub, nl s1 s2                           - JVD- none , edema- none, stasis changes- none, varices- none  Lung- clear to P&A, wheeze- none, cough- none , dullness-none, rub- none           Chest wall-  Abd-  Br/ Gen/ Rectal- Not done, not indicated Extrem- +cane Neuro- grossly intact to observation

## 2022-09-22 ENCOUNTER — Ambulatory Visit: Payer: Medicare Other | Admitting: Internal Medicine

## 2022-09-22 ENCOUNTER — Encounter: Payer: Self-pay | Admitting: Internal Medicine

## 2022-09-22 DIAGNOSIS — G4733 Obstructive sleep apnea (adult) (pediatric): Secondary | ICD-10-CM

## 2022-09-22 NOTE — Patient Instructions (Signed)
We can  continue CPAP auto 4-18  Good luck with everything. Please call if we can help

## 2022-09-23 ENCOUNTER — Encounter: Payer: Self-pay | Admitting: Gastroenterology

## 2022-09-23 ENCOUNTER — Encounter: Payer: Self-pay | Admitting: Internal Medicine

## 2022-09-23 NOTE — Assessment & Plan Note (Signed)
Benefits from CPAP with good compliance and control Plan-continue auto 4-18

## 2022-09-23 NOTE — Assessment & Plan Note (Signed)
Weight fairly stable compared with last year despite significant complications of bowel perforation earlier this year.  Long-term goal of normalized body weight is still encouraged.

## 2022-09-25 NOTE — Anesthesia Preprocedure Evaluation (Signed)
Anesthesia Evaluation  Patient identified by MRN, date of birth, ID band Patient awake    Reviewed: Allergy & Precautions, NPO status , Patient's Chart, lab work & pertinent test results, reviewed documented beta blocker date and time   History of Anesthesia Complications Negative for: history of anesthetic complications  Airway Mallampati: II  TM Distance: >3 FB Neck ROM: Full    Dental  (+) Dental Advisory Given, Teeth Intact   Pulmonary sleep apnea and Continuous Positive Airway Pressure Ventilation ,    Pulmonary exam normal        Cardiovascular hypertension, Pt. on medications and Pt. on home beta blockers + CAD  Normal cardiovascular exam     Neuro/Psych negative neurological ROS  negative psych ROS   GI/Hepatic Neg liver ROS, hiatal hernia, GERD  Medicated and Controlled, Colostomy following bowel perforation, diverticulitis IBS    Endo/Other  Morbid obesity  Renal/GU negative Renal ROS     Musculoskeletal  (+) Arthritis ,   Abdominal   Peds  Hematology negative hematology ROS (+)   Anesthesia Other Findings   Reproductive/Obstetrics                            Anesthesia Physical Anesthesia Plan  ASA: 3  Anesthesia Plan: General   Post-op Pain Management: Tylenol PO (pre-op)*   Induction: Intravenous  PONV Risk Score and Plan: 3 and Treatment may vary due to age or medical condition, Ondansetron and Propofol infusion  Airway Management Planned: Oral ETT  Additional Equipment: None  Intra-op Plan:   Post-operative Plan: Extubation in OR  Informed Consent: I have reviewed the patients History and Physical, chart, labs and discussed the procedure including the risks, benefits and alternatives for the proposed anesthesia with the patient or authorized representative who has indicated his/her understanding and acceptance.     Dental advisory given  Plan Discussed  with: CRNA and Anesthesiologist  Anesthesia Plan Comments:        Anesthesia Quick Evaluation

## 2022-09-26 ENCOUNTER — Other Ambulatory Visit: Payer: Self-pay

## 2022-09-26 ENCOUNTER — Inpatient Hospital Stay (HOSPITAL_COMMUNITY): Payer: Medicare Other | Admitting: Physician Assistant

## 2022-09-26 ENCOUNTER — Inpatient Hospital Stay (HOSPITAL_COMMUNITY)
Admission: RE | Admit: 2022-09-26 | Discharge: 2022-09-30 | DRG: 345 | Disposition: A | Discharge status: Home Health | Payer: Medicare Other | Source: Ambulatory Visit | Attending: Surgery | Admitting: Surgery

## 2022-09-26 ENCOUNTER — Encounter (HOSPITAL_COMMUNITY)
Admission: RE | Disposition: A | Discharge status: Home Health | Payer: Self-pay | Source: Ambulatory Visit | Attending: Surgery

## 2022-09-26 ENCOUNTER — Inpatient Hospital Stay (HOSPITAL_COMMUNITY): Payer: Medicare Other | Admitting: Certified Registered"

## 2022-09-26 ENCOUNTER — Encounter (HOSPITAL_COMMUNITY): Payer: Self-pay | Admitting: Surgery

## 2022-09-26 DIAGNOSIS — E876 Hypokalemia: Secondary | ICD-10-CM | POA: Diagnosis not present

## 2022-09-26 DIAGNOSIS — Z7982 Long term (current) use of aspirin: Secondary | ICD-10-CM

## 2022-09-26 DIAGNOSIS — Z79890 Hormone replacement therapy (postmenopausal): Secondary | ICD-10-CM | POA: Diagnosis not present

## 2022-09-26 DIAGNOSIS — I10 Essential (primary) hypertension: Secondary | ICD-10-CM

## 2022-09-26 DIAGNOSIS — Z433 Encounter for attention to colostomy: Secondary | ICD-10-CM | POA: Diagnosis not present

## 2022-09-26 DIAGNOSIS — E785 Hyperlipidemia, unspecified: Secondary | ICD-10-CM | POA: Diagnosis not present

## 2022-09-26 DIAGNOSIS — Z885 Allergy status to narcotic agent status: Secondary | ICD-10-CM | POA: Diagnosis not present

## 2022-09-26 DIAGNOSIS — Z96653 Presence of artificial knee joint, bilateral: Secondary | ICD-10-CM | POA: Diagnosis present

## 2022-09-26 DIAGNOSIS — Z90722 Acquired absence of ovaries, bilateral: Secondary | ICD-10-CM | POA: Diagnosis not present

## 2022-09-26 DIAGNOSIS — Z79899 Other long term (current) drug therapy: Secondary | ICD-10-CM | POA: Diagnosis not present

## 2022-09-26 DIAGNOSIS — K219 Gastro-esophageal reflux disease without esophagitis: Secondary | ICD-10-CM | POA: Diagnosis not present

## 2022-09-26 DIAGNOSIS — K6389 Other specified diseases of intestine: Secondary | ICD-10-CM | POA: Diagnosis not present

## 2022-09-26 DIAGNOSIS — I2510 Atherosclerotic heart disease of native coronary artery without angina pectoris: Secondary | ICD-10-CM

## 2022-09-26 DIAGNOSIS — G4733 Obstructive sleep apnea (adult) (pediatric): Secondary | ICD-10-CM | POA: Diagnosis present

## 2022-09-26 DIAGNOSIS — Z9079 Acquired absence of other genital organ(s): Secondary | ICD-10-CM | POA: Diagnosis not present

## 2022-09-26 DIAGNOSIS — Z800 Family history of malignant neoplasm of digestive organs: Secondary | ICD-10-CM

## 2022-09-26 DIAGNOSIS — G8929 Other chronic pain: Secondary | ICD-10-CM | POA: Diagnosis present

## 2022-09-26 DIAGNOSIS — I7300 Raynaud's syndrome without gangrene: Secondary | ICD-10-CM | POA: Diagnosis not present

## 2022-09-26 DIAGNOSIS — Z9049 Acquired absence of other specified parts of digestive tract: Secondary | ICD-10-CM | POA: Diagnosis not present

## 2022-09-26 DIAGNOSIS — K589 Irritable bowel syndrome without diarrhea: Secondary | ICD-10-CM | POA: Diagnosis present

## 2022-09-26 DIAGNOSIS — Z939 Artificial opening status, unspecified: Principal | ICD-10-CM

## 2022-09-26 DIAGNOSIS — Z91013 Allergy to seafood: Secondary | ICD-10-CM | POA: Diagnosis not present

## 2022-09-26 DIAGNOSIS — Z96643 Presence of artificial hip joint, bilateral: Secondary | ICD-10-CM | POA: Diagnosis present

## 2022-09-26 DIAGNOSIS — Z90710 Acquired absence of both cervix and uterus: Secondary | ICD-10-CM

## 2022-09-26 DIAGNOSIS — Z9989 Dependence on other enabling machines and devices: Secondary | ICD-10-CM

## 2022-09-26 DIAGNOSIS — J309 Allergic rhinitis, unspecified: Secondary | ICD-10-CM | POA: Diagnosis not present

## 2022-09-26 DIAGNOSIS — Z6841 Body Mass Index (BMI) 40.0 and over, adult: Secondary | ICD-10-CM | POA: Diagnosis not present

## 2022-09-26 HISTORY — PX: XI ROBOTIC ASSISTED COLOSTOMY TAKEDOWN: SHX6828

## 2022-09-26 LAB — CBC
HCT: 40 % (ref 36.0–46.0)
Hemoglobin: 12.9 g/dL (ref 12.0–15.0)
MCH: 32.1 pg (ref 26.0–34.0)
MCHC: 32.3 g/dL (ref 30.0–36.0)
MCV: 99.5 fL (ref 80.0–100.0)
Platelets: 186 10*3/uL (ref 150–400)
RBC: 4.02 MIL/uL (ref 3.87–5.11)
RDW: 13.9 % (ref 11.5–15.5)
WBC: 7.4 10*3/uL (ref 4.0–10.5)
nRBC: 0 % (ref 0.0–0.2)

## 2022-09-26 LAB — CREATININE, SERUM
Creatinine, Ser: 0.84 mg/dL (ref 0.44–1.00)
GFR, Estimated: >60 mL/min (ref >60)

## 2022-09-26 SURGERY — CLOSURE, COLOSTOMY, ROBOT-ASSISTED
Anesthesia: General | Site: Abdomen

## 2022-09-26 MED ORDER — PROPOFOL 500 MG/50ML IV EMUL
INTRAVENOUS | Status: DC | PRN
  Administered 2022-09-26: 25 ug/kg/min via INTRAVENOUS

## 2022-09-26 MED ORDER — ATORVASTATIN CALCIUM 20 MG PO TABS
20.0000 mg | ORAL_TABLET | Freq: Every day | ORAL | Status: DC
  Administered 2022-09-27 – 2022-09-30 (×4): 20 mg via ORAL
  Filled 2022-09-26 (×4): qty 1

## 2022-09-26 MED ORDER — ASPIRIN 81 MG PO TBEC
81.0000 mg | DELAYED_RELEASE_TABLET | Freq: Every day | ORAL | Status: DC
  Administered 2022-09-27 – 2022-09-30 (×4): 81 mg via ORAL
  Filled 2022-09-26 (×4): qty 1

## 2022-09-26 MED ORDER — ENOXAPARIN SODIUM 40 MG/0.4ML IJ SOSY
40.0000 mg | PREFILLED_SYRINGE | INTRAMUSCULAR | Status: DC
  Administered 2022-09-27 – 2022-09-30 (×4): 40 mg via SUBCUTANEOUS
  Filled 2022-09-26 (×4): qty 0.4

## 2022-09-26 MED ORDER — ACETAMINOPHEN 500 MG PO TABS
1000.0000 mg | ORAL_TABLET | Freq: Once | ORAL | Status: DC
  Filled 2022-09-26: qty 2

## 2022-09-26 MED ORDER — ORAL CARE MOUTH RINSE: 15.0000 mL | Freq: Once | OROMUCOSAL | Status: AC

## 2022-09-26 MED ORDER — SODIUM CHLORIDE 0.9 % IV SOLN
INTRAVENOUS | Status: AC
  Filled 2022-09-26: qty 2

## 2022-09-26 MED ORDER — PROCHLORPERAZINE EDISYLATE 10 MG/2ML IJ SOLN: 10.0000 mg | INTRAMUSCULAR | Status: DC | PRN

## 2022-09-26 MED ORDER — ROCURONIUM BROMIDE 10 MG/ML (PF) SYRINGE
PREFILLED_SYRINGE | INTRAVENOUS | Status: DC | PRN
  Administered 2022-09-26: 10 mg via INTRAVENOUS
  Administered 2022-09-26: 20 mg via INTRAVENOUS
  Administered 2022-09-26: 50 mg via INTRAVENOUS
  Administered 2022-09-26 (×2): 20 mg via INTRAVENOUS

## 2022-09-26 MED ORDER — BUPIVACAINE LIPOSOME 1.3 % IJ SUSP
INTRAMUSCULAR | Status: DC | PRN
  Administered 2022-09-26: 20 mL

## 2022-09-26 MED ORDER — FENTANYL CITRATE (PF) 100 MCG/2ML IJ SOLN
INTRAMUSCULAR | Status: DC | PRN
  Administered 2022-09-26: 25 ug via INTRAVENOUS
  Administered 2022-09-26 (×3): 50 ug via INTRAVENOUS
  Administered 2022-09-26: 25 ug via INTRAVENOUS

## 2022-09-26 MED ORDER — PANTOPRAZOLE SODIUM 40 MG PO TBEC
40.0000 mg | DELAYED_RELEASE_TABLET | Freq: Every day | ORAL | Status: DC
  Administered 2022-09-27 – 2022-09-30 (×4): 40 mg via ORAL
  Filled 2022-09-26 (×4): qty 1

## 2022-09-26 MED ORDER — ONDANSETRON HCL 4 MG/2ML IJ SOLN
4.0000 mg | Freq: Four times a day (QID) | INTRAMUSCULAR | Status: DC | PRN
  Administered 2022-09-28 – 2022-09-29 (×2): 4 mg via INTRAVENOUS
  Filled 2022-09-26 (×5): qty 2

## 2022-09-26 MED ORDER — LACTATED RINGERS IR SOLN
Status: DC | PRN
  Administered 2022-09-26: 1000 mL

## 2022-09-26 MED ORDER — BUPIVACAINE LIPOSOME 1.3 % IJ SUSP
INTRAMUSCULAR | Status: AC
  Filled 2022-09-26: qty 20

## 2022-09-26 MED ORDER — LACTATED RINGERS IV SOLN: INTRAVENOUS | Status: DC

## 2022-09-26 MED ORDER — 0.9 % SODIUM CHLORIDE (POUR BTL) OPTIME
TOPICAL | Status: DC | PRN
  Administered 2022-09-26: 2000 mL

## 2022-09-26 MED ORDER — RAMIPRIL 5 MG PO CAPS
10.0000 mg | ORAL_CAPSULE | Freq: Every day | ORAL | Status: DC
  Administered 2022-09-27 – 2022-09-30 (×4): 10 mg via ORAL
  Filled 2022-09-26 (×4): qty 2

## 2022-09-26 MED ORDER — LORATADINE 10 MG PO TABS
10.0000 mg | ORAL_TABLET | Freq: Every day | ORAL | Status: DC
  Administered 2022-09-26 – 2022-09-30 (×5): 10 mg via ORAL
  Filled 2022-09-26 (×5): qty 1

## 2022-09-26 MED ORDER — METHOCARBAMOL 1000 MG/10ML IJ SOLN: 500.0000 mg | Freq: Four times a day (QID) | INTRAVENOUS | Status: DC | PRN

## 2022-09-26 MED ORDER — PSYLLIUM 95 % PO PACK
1.0000 | PACK | Freq: Two times a day (BID) | ORAL | Status: DC
  Administered 2022-09-26 – 2022-09-28 (×5): 1 via ORAL
  Filled 2022-09-26 (×6): qty 1

## 2022-09-26 MED ORDER — AZELAIC ACID 15 % EX GEL: 1.0000 "application " | Freq: Two times a day (BID) | CUTANEOUS | Status: DC | PRN

## 2022-09-26 MED ORDER — DULOXETINE HCL 60 MG PO CPEP
60.0000 mg | ORAL_CAPSULE | Freq: Every evening | ORAL | Status: DC
  Administered 2022-09-26 – 2022-09-29 (×4): 60 mg via ORAL
  Filled 2022-09-26 (×4): qty 1

## 2022-09-26 MED ORDER — ROCURONIUM BROMIDE 10 MG/ML (PF) SYRINGE
PREFILLED_SYRINGE | INTRAVENOUS | Status: AC
  Filled 2022-09-26: qty 10

## 2022-09-26 MED ORDER — PROPOFOL 500 MG/50ML IV EMUL
INTRAVENOUS | Status: AC
  Filled 2022-09-26: qty 100

## 2022-09-26 MED ORDER — FENTANYL CITRATE (PF) 100 MCG/2ML IJ SOLN
INTRAMUSCULAR | Status: AC
  Filled 2022-09-26: qty 2

## 2022-09-26 MED ORDER — SODIUM CHLORIDE 0.9 % IV SOLN
INTRAVENOUS | Status: DC | PRN
  Administered 2022-09-26: 2 g via INTRAVENOUS

## 2022-09-26 MED ORDER — GABAPENTIN 300 MG PO CAPS
300.0000 mg | ORAL_CAPSULE | Freq: Three times a day (TID) | ORAL | Status: DC
  Administered 2022-09-26 – 2022-09-28 (×8): 300 mg via ORAL
  Filled 2022-09-26 (×12): qty 1

## 2022-09-26 MED ORDER — DEXAMETHASONE SODIUM PHOSPHATE 10 MG/ML IJ SOLN
INTRAMUSCULAR | Status: DC | PRN
  Administered 2022-09-26: 4 mg via INTRAVENOUS

## 2022-09-26 MED ORDER — ACETAMINOPHEN 325 MG PO TABS
650.0000 mg | ORAL_TABLET | Freq: Four times a day (QID) | ORAL | Status: DC
  Administered 2022-09-26 – 2022-09-30 (×13): 650 mg via ORAL
  Filled 2022-09-26 (×15): qty 2

## 2022-09-26 MED ORDER — BUPIVACAINE-EPINEPHRINE 0.25% -1:200000 IJ SOLN
INTRAMUSCULAR | Status: DC | PRN
  Administered 2022-09-26: 30 mL

## 2022-09-26 MED ORDER — OXYCODONE HCL 5 MG PO TABS
5.0000 mg | ORAL_TABLET | ORAL | Status: DC | PRN
  Administered 2022-09-27 – 2022-09-28 (×4): 5 mg via ORAL
  Filled 2022-09-26 (×5): qty 1

## 2022-09-26 MED ORDER — OXYCODONE HCL 5 MG PO TABS: 5.0000 mg | ORAL_TABLET | Freq: Once | ORAL | Status: DC | PRN

## 2022-09-26 MED ORDER — CLOBETASOL PROPIONATE 0.05 % EX CREA: 1.0000 | TOPICAL_CREAM | Freq: Every day | CUTANEOUS | Status: DC | PRN

## 2022-09-26 MED ORDER — DEXAMETHASONE SODIUM PHOSPHATE 10 MG/ML IJ SOLN
INTRAMUSCULAR | Status: AC
  Filled 2022-09-26: qty 1

## 2022-09-26 MED ORDER — LIDOCAINE 2% (20 MG/ML) 5 ML SYRINGE
INTRAMUSCULAR | Status: DC | PRN
  Administered 2022-09-26: 60 mg via INTRAVENOUS

## 2022-09-26 MED ORDER — ALVIMOPAN 12 MG PO CAPS
12.0000 mg | ORAL_CAPSULE | Freq: Two times a day (BID) | ORAL | Status: DC
  Administered 2022-09-27 – 2022-09-28 (×3): 12 mg via ORAL
  Filled 2022-09-26 (×5): qty 1

## 2022-09-26 MED ORDER — PROPOFOL 500 MG/50ML IV EMUL
INTRAVENOUS | Status: AC
  Filled 2022-09-26: qty 50

## 2022-09-26 MED ORDER — DOCUSATE SODIUM 100 MG PO CAPS
100.0000 mg | ORAL_CAPSULE | Freq: Two times a day (BID) | ORAL | Status: DC
  Administered 2022-09-26 – 2022-09-29 (×7): 100 mg via ORAL
  Filled 2022-09-26 (×7): qty 1

## 2022-09-26 MED ORDER — OXYCODONE HCL 5 MG PO TABS
10.0000 mg | ORAL_TABLET | ORAL | Status: DC | PRN
  Administered 2022-09-26: 10 mg via ORAL
  Filled 2022-09-26 (×2): qty 2

## 2022-09-26 MED ORDER — FENTANYL CITRATE PF 50 MCG/ML IJ SOSY: 25.0000 ug | PREFILLED_SYRINGE | INTRAMUSCULAR | Status: DC | PRN

## 2022-09-26 MED ORDER — CARVEDILOL 3.125 MG PO TABS
3.1250 mg | ORAL_TABLET | Freq: Two times a day (BID) | ORAL | Status: DC
  Administered 2022-09-26 – 2022-09-30 (×8): 3.125 mg via ORAL
  Filled 2022-09-26 (×8): qty 1

## 2022-09-26 MED ORDER — LIDOCAINE HCL (PF) 2 % IJ SOLN
INTRAMUSCULAR | Status: AC
  Filled 2022-09-26: qty 5

## 2022-09-26 MED ORDER — ONDANSETRON HCL 4 MG/2ML IJ SOLN: 4.0000 mg | Freq: Once | INTRAMUSCULAR | Status: DC | PRN

## 2022-09-26 MED ORDER — PROPOFOL 10 MG/ML IV BOLUS
INTRAVENOUS | Status: DC | PRN
  Administered 2022-09-26: 40 mg via INTRAVENOUS
  Administered 2022-09-26: 130 mg via INTRAVENOUS

## 2022-09-26 MED ORDER — PROPOFOL 10 MG/ML IV BOLUS
INTRAVENOUS | Status: AC
  Filled 2022-09-26: qty 20

## 2022-09-26 MED ORDER — SIMETHICONE 80 MG PO CHEW
80.0000 mg | CHEWABLE_TABLET | Freq: Four times a day (QID) | ORAL | Status: DC | PRN
  Administered 2022-09-28 (×2): 80 mg via ORAL
  Filled 2022-09-26 (×2): qty 1

## 2022-09-26 MED ORDER — ONDANSETRON HCL 4 MG/2ML IJ SOLN
INTRAMUSCULAR | Status: DC | PRN
  Administered 2022-09-26: 4 mg via INTRAVENOUS

## 2022-09-26 MED ORDER — LACTATED RINGERS IV SOLN: INTRAVENOUS | Status: DC | PRN

## 2022-09-26 MED ORDER — SUGAMMADEX SODIUM 200 MG/2ML IV SOLN
INTRAVENOUS | Status: DC | PRN
  Administered 2022-09-26: 230 mg via INTRAVENOUS

## 2022-09-26 MED ORDER — BUPIVACAINE-EPINEPHRINE (PF) 0.25% -1:200000 IJ SOLN
INTRAMUSCULAR | Status: AC
  Filled 2022-09-26: qty 30

## 2022-09-26 MED ORDER — ONDANSETRON HCL 4 MG/2ML IJ SOLN
INTRAMUSCULAR | Status: AC
  Filled 2022-09-26: qty 2

## 2022-09-26 MED ORDER — CHLORHEXIDINE GLUCONATE 0.12 % MT SOLN
15.0000 mL | Freq: Once | OROMUCOSAL | Status: AC
  Administered 2022-09-26: 15 mL via OROMUCOSAL

## 2022-09-26 MED ORDER — AMLODIPINE BESYLATE 5 MG PO TABS
5.0000 mg | ORAL_TABLET | Freq: Every day | ORAL | Status: DC
  Administered 2022-09-27 – 2022-09-30 (×4): 5 mg via ORAL
  Filled 2022-09-26 (×4): qty 1

## 2022-09-26 MED ORDER — PHENYLEPHRINE HCL-NACL 20-0.9 MG/250ML-% IV SOLN
INTRAVENOUS | Status: DC | PRN
  Administered 2022-09-26: 20 ug/min via INTRAVENOUS

## 2022-09-26 MED ORDER — OXYCODONE HCL 5 MG/5ML PO SOLN: 5.0000 mg | Freq: Once | ORAL | Status: DC | PRN

## 2022-09-26 MED ORDER — HYDROMORPHONE HCL 1 MG/ML IJ SOLN: 0.5000 mg | INTRAMUSCULAR | Status: DC | PRN

## 2022-09-26 SURGICAL SUPPLY — 96 items
BAG COUNTER SPONGE SURGICOUNT (BAG) ×1 IMPLANT
BAG SPNG CNTER NS LX DISP (BAG)
BLADE EXTENDED COATED 6.5IN (ELECTRODE) IMPLANT
BNDG GAUZE DERMACEA FLUFF 4 (GAUZE/BANDAGES/DRESSINGS) IMPLANT
BNDG GZE DERMACEA 4 6PLY (GAUZE/BANDAGES/DRESSINGS) ×1
CANNULA REDUC XI 12-8 STAPL (CANNULA) ×1
CANNULA REDUCER 12-8 DVNC XI (CANNULA) IMPLANT
CELLS DAT CNTRL 66122 CELL SVR (MISCELLANEOUS) IMPLANT
COVER SURGICAL LIGHT HANDLE (MISCELLANEOUS) ×2 IMPLANT
COVER TIP SHEARS 8 DVNC (MISCELLANEOUS) ×1 IMPLANT
COVER TIP SHEARS 8MM DA VINCI (MISCELLANEOUS) ×1
DRAIN CHANNEL 19F RND (DRAIN) IMPLANT
DRAPE ARM DVNC X/XI (DISPOSABLE) ×4 IMPLANT
DRAPE COLUMN DVNC XI (DISPOSABLE) ×1 IMPLANT
DRAPE DA VINCI XI ARM (DISPOSABLE) ×4
DRAPE DA VINCI XI COLUMN (DISPOSABLE) ×1
DRAPE SURG IRRIG POUCH 19X23 (DRAPES) ×1 IMPLANT
DRSG OPSITE POSTOP 4X10 (GAUZE/BANDAGES/DRESSINGS) IMPLANT
DRSG OPSITE POSTOP 4X6 (GAUZE/BANDAGES/DRESSINGS) IMPLANT
DRSG OPSITE POSTOP 4X8 (GAUZE/BANDAGES/DRESSINGS) IMPLANT
ELECT PENCIL ROCKER SW 15FT (MISCELLANEOUS) ×1 IMPLANT
ELECT REM PT RETURN 15FT ADLT (MISCELLANEOUS) ×1 IMPLANT
ENDOLOOP SUT PDS II  0 18 (SUTURE)
ENDOLOOP SUT PDS II 0 18 (SUTURE) IMPLANT
EVACUATOR SILICONE 100CC (DRAIN) IMPLANT
GAUZE SPONGE 4X4 12PLY STRL (GAUZE/BANDAGES/DRESSINGS) IMPLANT
GLOVE BIO SURGEON STRL SZ7.5 (GLOVE) IMPLANT
GLOVE BIOGEL PI IND STRL 7.0 (GLOVE) ×2 IMPLANT
GLOVE BIOGEL PI IND STRL 8 (GLOVE) IMPLANT
GOWN SRG XL LVL 4 BRTHBL STRL (GOWNS) ×1 IMPLANT
GOWN STRL NON-REIN XL LVL4 (GOWNS) ×3
GOWN STRL REUS W/ TWL XL LVL3 (GOWN DISPOSABLE) ×3 IMPLANT
GOWN STRL REUS W/TWL XL LVL3 (GOWN DISPOSABLE) ×3
GRASPER SUT TROCAR 14GX15 (MISCELLANEOUS) IMPLANT
HOLDER FOLEY CATH W/STRAP (MISCELLANEOUS) ×1 IMPLANT
IRRIG SUCT STRYKERFLOW 2 WTIP (MISCELLANEOUS) ×1
IRRIGATION SUCT STRKRFLW 2 WTP (MISCELLANEOUS) ×1 IMPLANT
KIT PROCEDURE DA VINCI SI (MISCELLANEOUS)
KIT PROCEDURE DVNC SI (MISCELLANEOUS) IMPLANT
KIT TURNOVER KIT A (KITS) IMPLANT
NDL INSUFFLATION 14GA 120MM (NEEDLE) ×1 IMPLANT
NEEDLE INSUFFLATION 14GA 120MM (NEEDLE) ×1 IMPLANT
PACK CARDIOVASCULAR III (CUSTOM PROCEDURE TRAY) ×1 IMPLANT
PACK COLON (CUSTOM PROCEDURE TRAY) ×1 IMPLANT
PAD POSITIONING PINK XL (MISCELLANEOUS) ×1 IMPLANT
RELOAD STAPLE 60 3.5 BLU DVNC (STAPLE) IMPLANT
RELOAD STAPLE 60 4.3 GRN DVNC (STAPLE) IMPLANT
RELOAD STAPLER 3.5X60 BLU DVNC (STAPLE) ×2 IMPLANT
RELOAD STAPLER 4.3X60 GRN DVNC (STAPLE) IMPLANT
RETRACTOR WND ALEXIS 18 MED (MISCELLANEOUS) IMPLANT
RTRCTR WOUND ALEXIS 18CM MED (MISCELLANEOUS)
SCISSORS LAP 5X35 DISP (ENDOMECHANICALS) IMPLANT
SEAL CANN UNIV 5-8 DVNC XI (MISCELLANEOUS) ×3 IMPLANT
SEAL XI 5MM-8MM UNIVERSAL (MISCELLANEOUS) ×4
SEALER VESSEL DA VINCI XI (MISCELLANEOUS) ×1
SEALER VESSEL EXT DVNC XI (MISCELLANEOUS) ×1 IMPLANT
SOLUTION ELECTROLUBE (MISCELLANEOUS) ×1 IMPLANT
SPIKE FLUID TRANSFER (MISCELLANEOUS) IMPLANT
STAPLER 60 DA VINCI SURE FORM (STAPLE) ×1
STAPLER 60 SUREFORM DVNC (STAPLE) IMPLANT
STAPLER CANNULA SEAL DVNC XI (STAPLE) IMPLANT
STAPLER CANNULA SEAL XI (STAPLE) ×1
STAPLER ECHELON POWER CIR 29 (STAPLE) IMPLANT
STAPLER ECHELON POWER CIR 31 (STAPLE) IMPLANT
STAPLER RELOAD 3.5X60 BLU DVNC (STAPLE) ×2
STAPLER RELOAD 3.5X60 BLUE (STAPLE) ×2
STAPLER RELOAD 4.3X60 GREEN (STAPLE)
STAPLER RELOAD 4.3X60 GRN DVNC (STAPLE)
SUT ETHILON 2 0 PS N (SUTURE) IMPLANT
SUT PDS AB 2-0 CT2 27 (SUTURE) IMPLANT
SUT PROLENE 2 0 KS (SUTURE) IMPLANT
SUT SILK 2 0 (SUTURE) ×1
SUT SILK 2 0 SH CR/8 (SUTURE) IMPLANT
SUT SILK 2-0 18XBRD TIE 12 (SUTURE) ×1 IMPLANT
SUT SILK 3 0 (SUTURE)
SUT SILK 3 0 SH CR/8 (SUTURE) ×1 IMPLANT
SUT SILK 3-0 18XBRD TIE 12 (SUTURE) IMPLANT
SUT STRAFIX PDS 18 CTX (SUTURE) IMPLANT
SUT V-LOC BARB 180 2/0GR6 GS22 (SUTURE) ×2
SUT VIC AB 2-0 SH 18 (SUTURE) IMPLANT
SUT VIC AB 2-0 SH 27 (SUTURE) ×1
SUT VIC AB 2-0 SH 27X BRD (SUTURE) IMPLANT
SUT VIC AB 3-0 SH 18 (SUTURE) IMPLANT
SUT VIC AB 4-0 PS2 27 (SUTURE) ×2 IMPLANT
SUTURE V-LC BRB 180 2/0GR6GS22 (SUTURE) IMPLANT
SYR 10ML ECCENTRIC (SYRINGE) ×1 IMPLANT
SYS LAPSCP GELPORT 120MM (MISCELLANEOUS)
SYS WOUND ALEXIS 18CM MED (MISCELLANEOUS) ×1
SYSTEM LAPSCP GELPORT 120MM (MISCELLANEOUS) IMPLANT
SYSTEM WOUND ALEXIS 18CM MED (MISCELLANEOUS) IMPLANT
TOWEL OR 17X26 10 PK STRL BLUE (TOWEL DISPOSABLE) IMPLANT
TOWEL OR NON WOVEN STRL DISP B (DISPOSABLE) ×1 IMPLANT
TRAY FOLEY MTR SLVR 14FR STAT (SET/KITS/TRAYS/PACK) IMPLANT
TROCAR Z THREAD OPTICAL 12X100 (TROCAR) IMPLANT
TUBING CONNECTING 10 (TUBING) ×2 IMPLANT
TUBING INSUFFLATION 10FT LAP (TUBING) ×1 IMPLANT

## 2022-09-26 NOTE — H&P (Signed)
Admitting Physician: Nickola Major Nicola Heinemann  Service: General Surgery  CC: Ostomy  Subjective   HPI: Terri Rodriguez is an 76 y.o. female who is here for ostomy reversal  Past Medical History:  Diagnosis Date   ALLERGIC RHINITIS    Allergy    Anginal pain ([place])    Blood transfusion without reported diagnosis    Cataract    small right   Cholelithiasis 06/09/2008   gallstones 2009 on ct scan   Complication of anesthesia 2012   went to ICU after bilateral knees , unstable vital signs oct 2012, has surgery since did ok   Coronary artery disease    30 % narrowing lad   Diverticulitis    EPICONDYLITIS, LATERAL    osteoarthritis, previous joint replacements   Fall 10/2018   GERD    Hepatic hemangioma 06/09/2008   Hiatal hernia 03/2000   Hx of adenomatous polyp of colon 06/05/2008   Hx of cardiac catheterization 2008   clean coronarys DrKelly    Hx of chest pain 2003   neg cath remot hx of narrowwing lad in 2003 nl 2008, none in many years   HYPERLIPIDEMIA    IBS (irritable bowel syndrome)    LEUKOPENIA, CHRONIC    Mononucleosis 1963   RAYNAUD'S SYNDROME, HX OF    improved after cardiac meds initiated   Rosacea    facial   Sleep apnea    SLEEP APNEA, OBSTRUCTIVE    cpap, settings "automatic" settings 11-12   Subacute thyroiditis    UNSPECIFIED ANEMIA     Past Surgical History:  Procedure Laterality Date   ABDOMINAL HYSTERECTOMY  1993   bso   BACK SURGERY  2008   l 3 to l4 l4 to l5   BARTHOLIN GLAND CYST EXCISION Right 09/09/2019   Procedure: EXCISION OF VULVAR MASS TIMES TWO;  Surgeon: Megan Salon, MD;  Location: [facility];  Service: Gynecology;  Laterality: Right;   CARDIAC CATHETERIZATION  04/07/2007   Noncritical coronary artery disease. Contiue medical therapy.   CARDIAC CATHETERIZATION  12/03/2007   Normal LV function. Mild angiographic mitral valve prolapse. Normal coronary arteries.   CARDIOVASCULAR STRESS TEST  11/09/2007   Moderate  ischemia in Mid Anterior, Mid Anteroseptal, Apical Anterior, and Apical Septal regions. EKG negative for ischemia.   [place]   COLON RESECTION Left 01/03/2022   Procedure: COLON RESECTION;  Surgeon: Felicie Morn, MD;  Location: [facility];  Service: General;  Laterality: Left;   COLOSTOMY  01/03/2022   Procedure: COLOSTOMY;  Surgeon: Felicie Morn, MD;  Location: [facility];  Service: General;;   dental implants  11/2002,11/2011,11/2012   dental implants     x 6   [place]     d and e 1988 and [place]  09/21/2011   bilateral knee   KNEE ARTHROSCOPY     bilateral   LAPAROTOMY N/A 01/03/2022   Procedure: EXPLORATORY LAPAROTOMY;  Surgeon: Felicie Morn, MD;  Location: [facility];  Service: General;  Laterality: N/A;   lumbar surgery fixation with disc replacement  10/08   Left   NASAL SEPTOPLASTY W/ TURBINOPLASTY  05/2000   NOCTURNAL POLYSOMNOGRAM  12/31/2006   Severe obstructive sleep apnea. AHI-87/hr   REPLACEMENT TOTAL KNEE  2012   bilateral   TONSILLECTOMY  1952   adenoids too   TOTAL HIP ARTHROPLASTY Left 10/21/2013   Procedure: LEFT  TOTAL HIP ARTHROPLASTY ANTERIOR APPROACH;  Surgeon: [name], MD;  Location: [facility];  Service: Orthopedics;  Laterality: Left;   TOTAL HIP ARTHROPLASTY Right 07/16/2014   Procedure: RIGHT TOTAL HIP ARTHROPLASTY ANTERIOR APPROACH;  Surgeon: [name], MD;  Location: [facility];  Service: Orthopedics;  Laterality: Right;   TRANSTHORACIC ECHOCARDIOGRAM  11/09/2007   EF 37%, LV systolic function normal. Mild aortic root dilation.   WRIST SURGERY Left 04/03/14   ORIF "distal radial head fracture"    Family History  Problem Relation Age of Onset   Rheum arthritis Mother    Diabetes Mother    Heart failure Mother    Thrombocytopenia Mother    Sleep apnea Mother    Ulcers Mother        PUD   Deep vein thrombosis Father    Pulmonary embolism Father     Osteoarthritis Father    Atrial fibrillation Father    Dementia Father    Sleep apnea Father    Sleep apnea Brother    Obesity Brother    Sleep apnea Brother    Obesity Brother    Atrial fibrillation Brother    Colon cancer Maternal Aunt 59   Colon cancer Maternal Aunt 90   Congenital adrenal hyperplasia Grandchild    Pancreatic cancer Other    Uterine cancer Other    Leukemia Other    Inflammatory bowel disease Other        aunt   Esophageal cancer Neg Hx    Rectal cancer Neg Hx    Stomach cancer Neg Hx    Colon polyps Neg Hx     Social:  reports that she has never smoked. She has never used smokeless tobacco. She reports that she does not currently use alcohol after a past usage of about 7.0 standard drinks of alcohol per week. She reports that she does not use drugs.  Allergies:  Allergies  Allergen Reactions   Codeine Nausea Only   Crab [Shellfish Allergy] Itching and Nausea And Vomiting    Medications: Current Outpatient Medications  Medication Instructions   acetaminophen (TYLENOL) 500 mg, Oral, Every 6 hours PRN   amLODipine (NORVASC) 5 MG tablet TAKE 1 TABLET BY MOUTH EVERY DAY   amoxicillin (AMOXIL) 2,000 mg, Oral, See admin instructions, Take 2000 mg by mouth 1 hour prior to dental procedures.   aspirin EC 81 mg, Oral, Daily   atorvastatin (LIPITOR) 20 MG tablet TAKE 1 TABLET BY MOUTH EVERY DAY AT 6 PM   Azelaic Acid 15 % cream 1 application , Topical, 2 times daily PRN, After skin is thoroughly washed and patted dry, gently but thoroughly massage a thin film of azelaic acid cream into the affected area t daily,after shower.   carvedilol (COREG) 3.125 MG tablet TAKE 1 TABLET BY MOUTH 2 TIMES DAILY WITH A meal   celecoxib (CELEBREX) 200 mg, Oral, Every other day   celecoxib (CELEBREX) 100 mg, Oral, Every other day   chlorhexidine (PERIDEX) 0.12 % solution 5 mLs, Mouth/Throat, 2 times daily   clindamycin (CLEOCIN T) 1 % external solution 1 Application, Topical, 2  times daily PRN   clobetasol (TEMOVATE) 0.05 % external solution 1 application , Topical, Daily PRN   diclofenac Sodium (VOLTAREN) 2 g, Topical, Daily PRN   DULoxetine (CYMBALTA) 60 MG capsule Oral, Daily   estradiol (ESTRACE) 0.5 MG tablet 1/2 tab daily   fexofenadine (ALLEGRA) 180 mg, Oral, Daily   fluocinonide (LIDEX) 0.05 % external solution 1 application ,  Daily PRN   ketoconazole (NIZORAL) 2 % cream 1 application , Daily PRN   metroNIDAZOLE (METROGEL) 0.78 % gel 1 Application, Topical, Daily PRN   Multiple Vitamin (MULTIVITAMIN WITH MINERALS) TABS tablet 1 tablet, Oral, Daily   nitroGLYCERIN (NITROSTAT) 0.4 MG SL tablet PLACE 1 TABLET UNDER THE TONGUE EVERY 5 MINUTES AS NEEDED FOR CHEST PAIN   omeprazole (PRILOSEC) 40 mg, Oral, Daily   psyllium (REGULOID) 0.52 g, Oral, 2 times daily   ramipril (ALTACE) 10 MG capsule TAKE 1 CAPSULE BY MOUTH EVERY DAY   Tums Ultra 1000 1,000 mg, Oral, Daily PRN   valACYclovir (VALTREX) 1000 MG tablet TAKE 2 TABLET BY MOUTH Q12 hours X 2 DOSES WITH FEVER BLISTERS    ROS - all of the below systems have been reviewed with the patient and positives are indicated with bold text General: chills, fever or night sweats Eyes: blurry vision or double vision ENT: epistaxis or sore throat Allergy/Immunology: itchy/watery eyes or nasal congestion Hematologic/Lymphatic: bleeding problems, blood clots or swollen lymph nodes Endocrine: temperature intolerance or unexpected weight changes Breast: new or changing breast lumps or nipple discharge Resp: cough, shortness of breath, or wheezing CV: chest pain or dyspnea on exertion GI: as per HPI GU: dysuria, trouble voiding, or hematuria MSK: joint pain or joint stiffness Neuro: TIA or stroke symptoms Derm: pruritus and skin lesion changes Psych: anxiety and depression  Objective   PE Blood pressure 121/71, pulse 85, temperature 98.1 F (36.7 C), temperature source Oral, resp. rate 16, height '5\' 4"'$  (1.626 m),  weight 114.1 kg, SpO2 96 %. Constitutional: NAD; conversant; no deformities Eyes: Moist conjunctiva; no lid lag; anicteric; PERRL Neck: Trachea midline; no thyromegaly Lungs: Normal respiratory effort; no tactile fremitus CV: RRR; no palpable thrills; no pitting edema GI: Abd soft, nontender, ostomy in place; no palpable hepatosplenomegaly MSK: Normal range of motion of extremities; no clubbing/cyanosis Psychiatric: Appropriate affect; alert and oriented x3 Lymphatic: No palpable cervical or axillary lymphadenopathy  No results found for this or any previous visit (from the past 24 hour(s)).  Imaging Orders  No imaging studies ordered today     Assessment and Plan   JOLEAH KOSAK is an 76 y.o. female with an ostomy, here for robotic reversal.  Risks, benefits and alternatives discussed and patient granted consent to proceed.    Felicie Morn, MD  [facility] Use [email or web address] to contact on call provider

## 2022-09-26 NOTE — Anesthesia Procedure Notes (Addendum)
Procedure Name: Intubation Date/Time: 09/26/2022 7:49 AM  Performed by: Eben Burow, CRNAPre-anesthesia Checklist: Patient identified, Emergency Drugs available, Suction available, Patient being monitored and Timeout performed Patient Re-evaluated:Patient Re-evaluated prior to induction Oxygen Delivery Method: Circle system utilized Preoxygenation: Pre-oxygenation with 100% oxygen Induction Type: IV induction Ventilation: Mask ventilation without difficulty and Oral airway inserted - appropriate to patient size Laryngoscope Size: Mac and 4 Grade View: Grade III Tube type: Oral Tube size: 7.0 mm Number of attempts: 1 Airway Equipment and Method: Stylet Placement Confirmation: ETT inserted through vocal cords under direct vision, positive ETCO2 and breath sounds checked- equal and bilateral Secured at: 22 cm Tube secured with: Tape Dental Injury: Teeth and Oropharynx as per pre-operative assessment

## 2022-09-26 NOTE — Op Note (Signed)
Patient: Terri Rodriguez (18-Nov-1946, [id number])  Date of Surgery: 09/26/2022   Preoperative Diagnosis: COLOSTOMY REVERSAL   Postoperative Diagnosis: COLOSTOMY REVERSAL   Surgical Procedure: ROBOTIC OSTOMY REVERSAL: 46503 (CPT)   Operative Team Members:  Surgeon(s) and Role:    * Arvine Clayburn, Nickola Major, MD - Primary    * Kinsinger, Arta Bruce, MD - Assisting   Anesthesiologist: [name], MD CRNA: Lind Covert, CRNA; Eben Burow, CRNA   Anesthesia: General   Fluids:  Total I/O In: 1700 [I.V.:1600; IV Piggyback:100] Out: 160 [Urine:140; TWSFK:81]  Complications: * No complications entered in OR log *  Drains:  none   Specimen:  ID Type Source Tests Collected by Time Destination  1 : Colostomy Tissue PATH GI Other SURGICAL PATHOLOGY Thera Basden, Nickola Major, MD 09/26/2022 1030      Disposition:  PACU - hemodynamically stable.  Plan of Care: Admit to inpatient     Indications for Procedure: Terri Rodriguez is a 76 y.o. female who has a history of ostomy creation for ischemic colitis.  She presents today for robotic ostomy takedown..  The procedure itself as well as its risks, benefits and alternatives were discussed.  The risks discussed included but were not limited to the risk of infection, bleeding, damage to nearby structures, and need for additional surgery or procedure.  After a full discussion and all questions answered the patient granted consent to proceed.  Findings: ostomy   Description of Procedure:   On the date stated above the patient was taken operating room suite and placed in supine position.  General endotracheal anesthesia was induced.  She was then moved into lithotomy position.  The patient's abdomen was prepped and draped in usual sterile fashion.  A timeout was completed verifying the correct patient, procedure, position, and equipment needed for the case.  I began by making a incision in the right upper abdomen and entered the abdomen  using Optiview technique.  The abdomen was insufflated to 15 mmHg.  There was no trauma the underlying viscera with initial trocar placement.  Four trocars were placed across the right abdomen.  Laparoscopic lysis of adhesion was performed to prevent any trauma to underlying viscera with trocar placement.  A circular incision was made around the ostomy.  Dissection was carried around the ostomy down through the level of the fascia into the abdomen.  The colon was fully mobilized off of the abdominal wall and dropped down into the abdomen.  A Alexis device and HandPort was placed through the defect in the abdominal wall where the ostomy was so the insufflation could be maintained.  The robot was then docked and we directed our attention towards mobilizing the rectum and distal colon.  The rectum was mobilized off the pelvic sidewalls in order to straighten the rectum.  There was a very long Hartmann pouch with likely some sigmoid colon still in place.  A dilator was placed through the anus and was unable to be passed completely up to the rectal staple line so decision was made to perform an intracorporeal anastomosis.  The descending colon was mobilized out of the retroperitoneum dividing the attachments.  The splenic flexure of the colon had been mobilized during the previous procedure so the colon was already pretty mobile.  The colon that had traversed the abdominal wall was resected by firing a blue load of the stapler across the distal descending colon.  This cleaned the end and prepared it for anastomosis to the rectum.  Enterotomies were made in the descending colon about 6 cm proximal to the stapled end, and the rectum near the stapled end.  Each end of the 60 mm blue load of the robotic linear stapler was inserted into these enterotomies and then fired in order to create the anastomosis.  The anastomosis was inspected through the common enterotomy and appeared well formed.  The common enterotomy was closed  using running 2 oh V-Loc suture as an internal layer and interrupted Vicryl suture as an outer layer.  Irrigation was used to submerge the anastomosis and a proctoscope was used to inflate the anastomosis from the rectum.  There were no bubbles suggesting a negative leak test.  At this point we directed our attention to closure.  The abdomen was desufflated.  The specimen was removed, this was the stapled end of the descending colon that had previously formed the ostomy.  All suture material was removed.  The ostomy site was closed at the posterior rectus sheath using 0 Vicryl suture.  The ostomy site was closed at the anterior rectus sheath layer using running #1 strata fix suture.  The skin of the ostomy site was reapproximated using a pursestring 2-0 PDS suture.  This remaining small wound was packed with gauze.  The 12 mm port site in the upper mid abdomen was closed at the fascial level using a 0 Vicryl suture.  The skin was closed with the 4 port sites with 4-0 Monocryl and Dermabond.  All sponge and needle counts were correct at the end of this case.  At the end of the case we reviewed the infection status of the case. Patient: Private Patient Elective Case Case: Elective Infection Present At Time Of Surgery (PATOS):  Some contamination from the creation of an intracorporeal colorectal anastomosis.  Terri Raw, MD General, Bariatric, & Minimally Invasive Surgery [facility], [place]

## 2022-09-26 NOTE — Transfer of Care (Signed)
Immediate Anesthesia Transfer of Care Note  Patient: Terri Rodriguez  Procedure(s) Performed: ROBOTIC OSTOMY REVERSAL (Abdomen)  Patient Location: PACU  Anesthesia Type:General  Level of Consciousness: sedated  Airway & Oxygen Therapy: Patient Spontanous Breathing and Patient connected to face mask oxygen  Post-op Assessment: Report given to RN and Post -op Vital signs reviewed and stable  Post vital signs: Reviewed and stable  Last Vitals:  Vitals Value Taken Time  BP 163/61 09/26/22 1107  Temp    Pulse 88 09/26/22 1108  Resp 17 09/26/22 1108  SpO2 94 % 09/26/22 1108  Vitals shown include unvalidated device data.  Last Pain:  Vitals:   09/26/22 0627  TempSrc: Oral  PainSc:       Patients Stated Pain Goal: 5 (43/60/16 5800)  Complications: No notable events documented.

## 2022-09-26 NOTE — Anesthesia Postprocedure Evaluation (Signed)
Anesthesia Post Note  Patient: Terri Rodriguez  Procedure(s) Performed: ROBOTIC OSTOMY REVERSAL (Abdomen)     Patient location during evaluation: PACU Anesthesia Type: General Level of consciousness: awake and alert Pain management: pain level controlled Vital Signs Assessment: post-procedure vital signs reviewed and stable Respiratory status: spontaneous breathing, nonlabored ventilation, respiratory function stable and patient connected to nasal cannula oxygen Cardiovascular status: blood pressure returned to baseline and stable Postop Assessment: no apparent nausea or vomiting Anesthetic complications: no   No notable events documented.  Last Vitals:  Vitals:   09/26/22 1315 09/26/22 1413  BP: 131/69 139/66  Pulse: 84 89  Resp: 16 18  Temp:  36.8 C  SpO2: 94% 94%    Last Pain:  Vitals:   09/26/22 1413  TempSrc: Oral  PainSc:                  [name]

## 2022-09-26 NOTE — Progress Notes (Signed)
PT Cancellation Note  Patient Details Name: Terri Rodriguez MRN: [id number] DOB: March 16, 1946   Cancelled Treatment:    Reason Eval/Treat Not Completed: Patient declined, no reason specified (Pt tired and hurting, encouraged to dangle EOB today with RN staff at least. Will follow up at later date/time when pt able and as schedule allows.)   Verner Mould, [place] Office [phone]  09/26/22 3:02 PM

## 2022-09-27 ENCOUNTER — Encounter (HOSPITAL_COMMUNITY): Payer: Self-pay | Admitting: Surgery

## 2022-09-27 LAB — CBC
HCT: 32.7 % — ABNORMAL LOW (ref 36.0–46.0)
Hemoglobin: 10.9 g/dL — ABNORMAL LOW (ref 12.0–15.0)
MCH: 32.8 pg (ref 26.0–34.0)
MCHC: 33.3 g/dL (ref 30.0–36.0)
MCV: 98.5 fL (ref 80.0–100.0)
Platelets: 173 10*3/uL (ref 150–400)
RBC: 3.32 MIL/uL — ABNORMAL LOW (ref 3.87–5.11)
RDW: 14.3 % (ref 11.5–15.5)
WBC: 8.9 10*3/uL (ref 4.0–10.5)
nRBC: 0 % (ref 0.0–0.2)

## 2022-09-27 LAB — BASIC METABOLIC PANEL
Anion gap: 6 (ref 5–15)
BUN: 14 mg/dL (ref 8–23)
CO2: 25 mmol/L (ref 22–32)
Calcium: 8.2 mg/dL — ABNORMAL LOW (ref 8.9–10.3)
Chloride: 108 mmol/L (ref 98–111)
Creatinine, Ser: 0.8 mg/dL (ref 0.44–1.00)
GFR, Estimated: >60 mL/min (ref >60)
Glucose, Bld: 137 mg/dL — ABNORMAL HIGH (ref 70–99)
Potassium: 3.4 mmol/L — ABNORMAL LOW (ref 3.5–5.1)
Sodium: 139 mmol/L (ref 135–145)

## 2022-09-27 LAB — SURGICAL PATHOLOGY

## 2022-09-27 NOTE — Evaluation (Signed)
Physical Therapy Evaluation Patient Details Name: Terri Rodriguez MRN: [id number] DOB: 1946-02-26 Today's Date: 09/27/2022  History of Present Illness  76 yo female s/p ostomy reversal 09/27/22. Hx of colostomy 12/2021, bil THA, CAD, falls, chronic leukopenia, L LE pain  Clinical Impression  On eval, pt required Min A for mobility. She walked ~100 feet with a RW. Moderate pain with activity. She fatigues fairly easily. Dyspnea 2/4. No family present during session. Will plan to follow and progress activity. Hopefully mobility team can work with her as well. At this time, recommendation is for HHPT-may not need it if she progresses well.        Recommendations for follow up therapy are one component of a multi-disciplinary discharge planning process, led by the attending physician.  Recommendations may be updated based on patient status, additional functional criteria and insurance authorization.  Follow Up Recommendations Home health PT      Assistance Recommended at Discharge Intermittent Supervision/Assistance  Patient can return home with the following  A little help with walking and/or transfers;A little help with bathing/dressing/bathroom;Help with stairs or ramp for entrance;Assistance with cooking/housework;Assist for transportation    Equipment Recommendations None recommended by PT  Recommendations for Other Services       Functional Status Assessment Patient has had a recent decline in their functional status and demonstrates the ability to make significant improvements in function in a reasonable and predictable amount of time.     Precautions / Restrictions Precautions Precautions: Fall Restrictions Weight Bearing Restrictions: No      Mobility  Bed Mobility Overal bed mobility: Needs Assistance Bed Mobility: Supine to Sit     Supine to sit: Min guard, HOB elevated     General bed mobility comments: Pt relied on bedrail. Increased time. Cues provided.     Transfers Overall transfer level: Needs assistance Equipment used: Rolling walker (2 wheels) Transfers: Sit to/from Stand Sit to Stand: From elevated surface, Min guard           General transfer comment: Min guard for safety. Cues for safety, hand placement. Increased time.    Ambulation/Gait Ambulation/Gait assistance: Min assist Gait Distance (Feet): 100 Feet Assistive device: Rolling walker (2 wheels) Gait Pattern/deviations: Decreased step length - right, Decreased step length - left, Decreased stride length       General Gait Details: Tends to intermittently go into a quick, shuffle step. Cues for posture, RW proximity, increased step length bilaterally, and to slow pace for improved safety and control. Dyspnea 2/4.  Stairs            Wheelchair Mobility    Modified Rankin (Stroke Patients Only)       Balance Overall balance assessment: Needs assistance, History of Falls         Standing balance support: Bilateral upper extremity supported, Reliant on assistive device for balance, During functional activity Standing balance-Leahy Scale: Poor                               Pertinent Vitals/Pain Pain Assessment Pain Assessment: 0-10 Pain Score: 7  Pain Location: abdomen Pain Descriptors / Indicators: Discomfort, Sore, Tightness Pain Intervention(s): Limited activity within patient's tolerance, Monitored during session, Repositioned    Home Living Family/patient expects to be discharged to:: Private residence Living Arrangements: Spouse/significant other Available Help at Discharge: Family;Available 24 hours/day Type of Home: House Home Access: Stairs to enter   [employer] of Steps: 1 Alternate  Level Stairs-Number of Steps: 16 Home Layout: Two level;Bed/bath upstairs Home Equipment: Rolling Walker (2 wheels);Cane - single point      Prior Function Prior Level of Function : Independent/Modified Independent;Driving;Needs  assist             Mobility Comments: used a cane some.       Hand Dominance   Dominant Hand: Right    Extremity/Trunk Assessment   Upper Extremity Assessment Upper Extremity Assessment: Defer to OT evaluation    Lower Extremity Assessment Lower Extremity Assessment: Generalized weakness    Cervical / Trunk Assessment Cervical / Trunk Assessment: Normal  Communication   Communication: No difficulties  Cognition Arousal/Alertness: Awake/alert Behavior During Therapy: WFL for tasks assessed/performed Overall Cognitive Status: Within Functional Limits for tasks assessed                                 General Comments: talkative; requires safety cues and cues to focus on task at hand        General Comments      Exercises     Assessment/Plan    PT Assessment Patient needs continued PT services  PT Problem List Decreased strength;Decreased mobility;Decreased activity tolerance;Decreased balance;Decreased knowledge of use of DME;Pain;Decreased safety awareness       PT Treatment Interventions DME instruction;Gait training;Therapeutic activities;Therapeutic exercise;Patient/family education;Balance training;Functional mobility training    PT Goals (Current goals can be found in the Care Plan section)  Acute Rehab PT Goals Patient Stated Goal: less pain PT Goal Formulation: With patient Time For Goal Achievement: 10/11/22 Potential to Achieve Goals: Good    Frequency Min 3X/week     Co-evaluation               AM-PAC PT "6 Clicks" Mobility  Outcome Measure Help needed turning from your back to your side while in a flat bed without using bedrails?: A Little Help needed moving from lying on your back to sitting on the side of a flat bed without using bedrails?: A Little Help needed moving to and from a bed to a chair (including a wheelchair)?: A Little Help needed standing up from a chair using your arms (e.g., wheelchair or bedside  chair)?: A Little Help needed to walk in hospital room?: A Little Help needed climbing 3-5 steps with a railing? : A Little 6 Click Score: 18    End of Session Equipment Utilized During Treatment: Gait belt Activity Tolerance: Patient tolerated treatment well Patient left: in chair;with call bell/phone within reach   PT Visit Diagnosis: Pain;Difficulty in walking, not elsewhere classified (R26.2);History of falling (Z91.81)    Time: 4332-9518 PT Time Calculation (min) (ACUTE ONLY): 19 min   Charges:   PT Evaluation $PT Eval Low Complexity: [place], PT Acute Rehabilitation  Office: [phone]

## 2022-09-27 NOTE — Progress Notes (Signed)
Progress Note: General Surgery Service   Chief Complaint/Subjective: Doing well POD1.  No gas or bm  Objective: Vital signs in last 24 hours: Temp:  [97.6 F (36.4 C)-98.8 F (37.1 C)] 98.6 F (37 C) (10/24 0945) Pulse Rate:  [84-108] 99 (10/24 0945) Resp:  [16-19] 19 (10/24 0945) BP: (108-145)/(53-70) 114/58 (10/24 0945) SpO2:  [90 %-97 %] 90 % (10/24 0945) Weight:  [118.8 kg] 118.8 kg (10/24 0500) Last BM Date :  (hx colostomy takedown.)  Intake/Output from previous day: 10/23 0701 - 10/24 0700 In: 3347.1 [P.O.:480; I.V.:2767.1; IV Piggyback:100] Out: 1060 [Urine:1040; Blood:20] Intake/Output this shift: Total I/O In: -  Out: 200 [Urine:200]  GI: Abd Incision c/d/I w/ glue,Bandage over ostomy site  Lab Results: CBC  Recent Labs    09/26/22 1148 09/27/22 0439  WBC 7.4 8.9  HGB 12.9 10.9*  HCT 40.0 32.7*  PLT 186 173   BMET Recent Labs    09/26/22 1148 09/27/22 0439  NA  --  139  K  --  3.4*  CL  --  108  CO2  --  25  GLUCOSE  --  137*  BUN  --  14  CREATININE 0.84 0.80  CALCIUM  --  8.2*   PT/INR No results for input(s): "LABPROT", "INR" in the last 72 hours. ABG No results for input(s): "PHART", "HCO3" in the last 72 hours.  Invalid input(s): "PCO2", "PO2"  Anti-infectives: Anti-infectives (From admission, onward)    Start     Dose/Rate Route Frequency Ordered Stop   09/26/22 0804  sodium chloride 0.9 % with cefoTEtan (CEFOTAN) ADS Med       Note to Pharmacy: Jasmine Pang K: cabinet override      09/26/22 0804 09/26/22 0809       Medications: Scheduled Meds:  acetaminophen  650 mg Oral Q6H   alvimopan  12 mg Oral BID   amLODipine  5 mg Oral Daily   aspirin EC  81 mg Oral Daily   atorvastatin  20 mg Oral Daily   carvedilol  3.125 mg Oral BID WC   docusate sodium  100 mg Oral BID   DULoxetine  60 mg Oral QPM   enoxaparin (LOVENOX) injection  40 mg Subcutaneous Q24H   gabapentin  300 mg Oral TID   loratadine  10 mg Oral Daily    pantoprazole  40 mg Oral Daily   psyllium  1 packet Oral BID   ramipril  10 mg Oral Daily   Continuous Infusions:  lactated ringers 75 mL/hr at 09/27/22 0531   methocarbamol (ROBAXIN) IV     PRN Meds:.Azelaic Acid, clobetasol cream, HYDROmorphone (DILAUDID) injection, methocarbamol (ROBAXIN) IV, ondansetron (ZOFRAN) IV, oxyCODONE, oxyCODONE, prochlorperazine, simethicone  Assessment/Plan: s/p Procedure(s): ROBOTIC OSTOMY REVERSAL 09/26/2022  Doing well POD1 Remove foley Increase activity - PT/OT    LOS: 1 day    Felicie Morn, MD  [facility] Use [email or web address] to contact on call provider  Daily Billing: [phone] - post op

## 2022-09-27 NOTE — TOC Progression Note (Signed)
Transition of Care [facility]) - Progression Note    Patient Details  Name: Terri Rodriguez MRN: [id number] Date of Birth: September 17, 1946  [facility]) CM/SW Contact  Purcell Mouton, RN Phone Number: 09/27/2022, 11:50 AM  Clinical Narrative:    TOC reviewed chart will continue to follow for [facility]    Expected Discharge Plan: [place] Barriers to Discharge: No Barriers Identified  Expected Discharge Plan and Services Expected Discharge Plan: [place] arrangements for the past 2 months: Single Family Home                                       Social Determinants of Health (SDOH) Interventions Food Insecurity Interventions: Patient Refused Housing Interventions: Patient Refused Transportation Interventions: Intervention Not Indicated Utilities Interventions: Patient Refused  Readmission Risk Interventions     No data to display

## 2022-09-27 NOTE — Evaluation (Signed)
Occupational Therapy Evaluation Patient Details Name: Terri Rodriguez MRN: [id number] DOB: 08/03/1946 Today's Date: 09/27/2022   History of Present Illness 76 yo female s/p ostomy reversal 09/27/22. Hx of colostomy 12/2021, bil THA, CAD, falls, chronic leukopenia, L LE pain   Clinical Impression   Patient is a 76 year old female with above medical history. Prior to hospitalization patient was mod I for ADLs and drove within the community. Patient now presents with decreased activity tolerance, pain in abdomen, and decreased balance. Patient max A for supine to sit-requiring physical assistance for LUE and sitting balance. Min A for sit to stand-exhibiting a posterior lean. Patient limited by decreased standing balance and pain,  only able to complete two side steps. Patient was transitioned back to bed. Patient requires continuous cueing and redirection to perform tasks. Patient Max A for LB ADLs. Patient does have 24/7 physical assistance from husband after discharge. Anticipate that patient will be able to progress to safely transition home with husband after acute stay.  OT will continue to follow acutely while in hospital to improve deficits and learn compensatory strategies as needed in order to return to PLOF.  Recommend HHOT.      Recommendations for follow up therapy are one component of a multi-disciplinary discharge planning process, led by the attending physician.  Recommendations may be updated based on patient status, additional functional criteria and insurance authorization.   Follow Up Recommendations  Home health OT    Assistance Recommended at Discharge Intermittent Supervision/Assistance  Patient can return home with the following A lot of help with walking and/or transfers;A lot of help with bathing/dressing/bathroom;Assist for transportation;Direct supervision/assist for financial management;Assistance with cooking/housework;Direct supervision/assist for medications  management    Functional Status Assessment  Patient has had a recent decline in their functional status and demonstrates the ability to make significant improvements in function in a reasonable and predictable amount of time.  Equipment Recommendations  None recommended by OT    Recommendations for Other Services       Precautions / Restrictions Precautions Precautions: Fall Restrictions Weight Bearing Restrictions: No      Mobility Bed Mobility Overal bed mobility: Needs Assistance Bed Mobility: Supine to Sit     Supine to sit: HOB elevated, Max assist     General bed mobility comments: Pt relied on bedrail. Increased time. Cues provided. physical assistance scooting EOB    Transfers   Equipment used: Rolling walker (2 wheels) Transfers: Sit to/from Stand Sit to Stand: From elevated surface, Min assist           General transfer comment: Cues for safety, hand placement. Increased time.      Balance Overall balance assessment: Needs assistance, History of Falls Sitting-balance support: Bilateral upper extremity supported, Feet supported Sitting balance-Leahy Scale: Poor Sitting balance - Comments: required phyiscal assistance from therapist to maintain sitting balance until patient became steady after sitting for a few mins. Postural control: Posterior lean Standing balance support: Bilateral upper extremity supported, Reliant on assistive device for balance, During functional activity Standing balance-Leahy Scale: Poor Standing balance comment: reliant on walker                           ADL either performed or assessed with clinical judgement   ADL Overall ADL's : Needs assistance/impaired Eating/Feeding: Set up;Sitting   Grooming: Set up;Sitting   Upper Body Bathing: Minimal assistance;Sitting   Lower Body Bathing: Maximal assistance   Upper Body Dressing :  Minimal assistance   Lower Body Dressing: Maximal assistance   Toilet  Transfer: Minimal assistance   Toileting- Clothing Manipulation and Hygiene: Maximal assistance               Vision Baseline Vision/History: 1 Wears glasses       Perception     Praxis      Pertinent Vitals/Pain Pain Assessment Pain Assessment: Faces Faces Pain Scale: Hurts little more Pain Location: abdomen Pain Descriptors / Indicators: Discomfort, Sore, Tightness Pain Intervention(s): Limited activity within patient's tolerance, Ice applied, Monitored during session     Hand Dominance Right   Extremity/Trunk Assessment Upper Extremity Assessment Upper Extremity Assessment: RUE deficits/detail;LUE deficits/detail RUE Deficits / Details: ROM-WFL RUE Sensation: WNL RUE Coordination: WNL LUE Deficits / Details: ROM-WFL LUE Sensation: WNL LUE Coordination: WNL   Lower Extremity Assessment Lower Extremity Assessment: Defer to PT evaluation   Cervical / Trunk Assessment Cervical / Trunk Assessment: Normal   Communication Communication Communication: No difficulties   Cognition Arousal/Alertness: Awake/alert Behavior During Therapy: WFL for tasks assessed/performed Overall Cognitive Status: Within Functional Limits for tasks assessed                                 General Comments: talkative; requires safety cues and cues to focus on task at hand. Patient reports that aesthesia affects her ability to partipcate in therapy.     General Comments       Exercises     Shoulder Instructions      Home Living Family/patient expects to be discharged to:: Private residence Living Arrangements: Spouse/significant other Available Help at Discharge: Family;Available 24 hours/day Type of Home: House Home Access: Stairs to enter [employer] of Steps: 1   Home Layout: Two level;Bed/bath upstairs Alternate Level Stairs-Number of Steps: 16 Alternate Level Stairs-Rails: Right Bathroom Shower/Tub: Hospital doctor Toilet:  Handicapped height     Home Equipment: Conservation officer, nature (2 wheels);Cane - single point;Shower seat;Grab bars - tub/shower          Prior Functioning/Environment Prior Level of Function : Independent/Modified Independent;Driving             Mobility Comments: used a cane some.          OT Problem List: Decreased strength;Decreased range of motion;Decreased activity tolerance;Impaired balance (sitting and/or standing);Decreased safety awareness;Pain;Decreased coordination      OT Treatment/Interventions: Self-care/ADL training;Therapeutic exercise;Modalities;Balance training;Energy conservation;Therapeutic activities;Cognitive remediation/compensation;DME and/or AE instruction    OT Goals(Current goals can be found in the care plan section)    OT Frequency: Min 2X/week    Co-evaluation              AM-PAC OT "6 Clicks" Daily Activity     Outcome Measure Help from another person eating meals?: A Little Help from another person taking care of personal grooming?: A Little Help from another person toileting, which includes using toliet, bedpan, or urinal?: A Lot Help from another person bathing (including washing, rinsing, drying)?: A Lot Help from another person to put on and taking off regular upper body clothing?: A Little Help from another person to put on and taking off regular lower body clothing?: A Lot 6 Click Score: 15   End of Session Equipment Utilized During Treatment: Gait belt;Rolling walker (2 wheels) Nurse Communication: Mobility status (pt. able to take a few side steps to head of bed. Pain limiting mobility.)  Activity Tolerance: Patient limited by pain  Patient left: in bed;with bed alarm set;with call bell/phone within reach  OT Visit Diagnosis: Unsteadiness on feet (R26.81);History of falling (Z91.81);Muscle weakness (generalized) (M62.81);Pain Pain - part of body:  (abdomen)                Time: 8377-9396 OT Time Calculation (min): 25  min Charges:  OT General Charges $OT Visit: 1 Visit OT Evaluation $OT Eval Moderate Complexity: 1 Mod OT Treatments $Therapeutic Activity: 8-22 mins  Charlann Lange, OTS Acute rehab services   Charlann Lange 09/27/2022, 4:52 PM

## 2022-09-28 LAB — CBC
HCT: 31.6 % — ABNORMAL LOW (ref 36.0–46.0)
Hemoglobin: 10.7 g/dL — ABNORMAL LOW (ref 12.0–15.0)
MCH: 32.8 pg (ref 26.0–34.0)
MCHC: 33.9 g/dL (ref 30.0–36.0)
MCV: 96.9 fL (ref 80.0–100.0)
Platelets: 166 10*3/uL (ref 150–400)
RBC: 3.26 MIL/uL — ABNORMAL LOW (ref 3.87–5.11)
RDW: 14.3 % (ref 11.5–15.5)
WBC: 8.8 10*3/uL (ref 4.0–10.5)
nRBC: 0 % (ref 0.0–0.2)

## 2022-09-28 LAB — BASIC METABOLIC PANEL
Anion gap: 7 (ref 5–15)
BUN: 17 mg/dL (ref 8–23)
CO2: 25 mmol/L (ref 22–32)
Calcium: 8.1 mg/dL — ABNORMAL LOW (ref 8.9–10.3)
Chloride: 104 mmol/L (ref 98–111)
Creatinine, Ser: 0.79 mg/dL (ref 0.44–1.00)
GFR, Estimated: >60 mL/min (ref >60)
Glucose, Bld: 120 mg/dL — ABNORMAL HIGH (ref 70–99)
Potassium: 3.3 mmol/L — ABNORMAL LOW (ref 3.5–5.1)
Sodium: 136 mmol/L (ref 135–145)

## 2022-09-28 MED ORDER — POTASSIUM CHLORIDE CRYS ER 20 MEQ PO TBCR
40.0000 meq | EXTENDED_RELEASE_TABLET | Freq: Once | ORAL | Status: AC
  Administered 2022-09-28: 40 meq via ORAL
  Filled 2022-09-28: qty 2

## 2022-09-28 NOTE — Progress Notes (Signed)
Progress Note: General Surgery Service   Chief Complaint/Subjective: Doing okay POD2.  Passing flatus, no bowel movements.  Feels very bloated  Objective: Vital signs in last 24 hours: Temp:  [98.2 F (36.8 C)-98.7 F (37.1 C)] 98.7 F (37.1 C) (10/25 0537) Pulse Rate:  [90-103] 98 (10/25 0537) Resp:  [16-20] 20 (10/25 0537) BP: (114-137)/(50-60) 129/60 (10/25 0537) SpO2:  [84 %-92 %] 92 % (10/25 0537) Weight:  [476 kg] 119 kg (10/25 0500) Last BM Date :  (hx colostomy takedown.)  Intake/Output from previous day: 10/24 0701 - 10/25 0700 In: 1080 [P.O.:1080] Out: 1275 [Urine:1275] Intake/Output this shift: No intake/output data recorded.  GI: Abd Incision c/d/I w/ glue,Bandage over ostomy site  Lab Results: CBC  Recent Labs    09/27/22 0439 09/28/22 0420  WBC 8.9 8.8  HGB 10.9* 10.7*  HCT 32.7* 31.6*  PLT 173 166    BMET Recent Labs    09/27/22 0439 09/28/22 0420  NA 139 136  K 3.4* 3.3*  CL 108 104  CO2 25 25  GLUCOSE 137* 120*  BUN 14 17  CREATININE 0.80 0.79  CALCIUM 8.2* 8.1*    PT/INR No results for input(s): "LABPROT", "INR" in the last 72 hours. ABG No results for input(s): "PHART", "HCO3" in the last 72 hours.  Invalid input(s): "PCO2", "PO2"  Anti-infectives: Anti-infectives (From admission, onward)    Start     Dose/Rate Route Frequency Ordered Stop   09/26/22 0804  sodium chloride 0.9 % with cefoTEtan (CEFOTAN) ADS Med       Note to Pharmacy: Jasmine Pang K: cabinet override      09/26/22 0804 09/26/22 0809       Medications: Scheduled Meds:  acetaminophen  650 mg Oral Q6H   alvimopan  12 mg Oral BID   amLODipine  5 mg Oral Daily   aspirin EC  81 mg Oral Daily   atorvastatin  20 mg Oral Daily   carvedilol  3.125 mg Oral BID WC   docusate sodium  100 mg Oral BID   DULoxetine  60 mg Oral QPM   enoxaparin (LOVENOX) injection  40 mg Subcutaneous Q24H   gabapentin  300 mg Oral TID   loratadine  10 mg Oral Daily    pantoprazole  40 mg Oral Daily   potassium chloride  40 mEq Oral Once   psyllium  1 packet Oral BID   ramipril  10 mg Oral Daily   Continuous Infusions:  lactated ringers 75 mL/hr at 09/28/22 0500   methocarbamol (ROBAXIN) IV     PRN Meds:.Azelaic Acid, clobetasol cream, HYDROmorphone (DILAUDID) injection, methocarbamol (ROBAXIN) IV, ondansetron (ZOFRAN) IV, oxyCODONE, oxyCODONE, prochlorperazine, simethicone  Assessment/Plan: s/p Procedure(s): ROBOTIC OSTOMY REVERSAL 09/26/2022  Doing okay POD2 Remove foley Increase activity - PT/OT Okay for straws and full liquid diet     LOS: 2 days    Felicie Morn, MD  [facility] Use [email or web address] to contact on call provider  Daily Billing: [phone] - post op

## 2022-09-29 LAB — BASIC METABOLIC PANEL
Anion gap: 9 (ref 5–15)
BUN: 17 mg/dL (ref 8–23)
CO2: 25 mmol/L (ref 22–32)
Calcium: 8.3 mg/dL — ABNORMAL LOW (ref 8.9–10.3)
Chloride: 104 mmol/L (ref 98–111)
Creatinine, Ser: 0.72 mg/dL (ref 0.44–1.00)
GFR, Estimated: >60 mL/min (ref >60)
Glucose, Bld: 140 mg/dL — ABNORMAL HIGH (ref 70–99)
Potassium: 3.7 mmol/L (ref 3.5–5.1)
Sodium: 138 mmol/L (ref 135–145)

## 2022-09-29 LAB — CBC
HCT: 36 % (ref 36.0–46.0)
Hemoglobin: 12.2 g/dL (ref 12.0–15.0)
MCH: 32.4 pg (ref 26.0–34.0)
MCHC: 33.9 g/dL (ref 30.0–36.0)
MCV: 95.5 fL (ref 80.0–100.0)
Platelets: 201 10*3/uL (ref 150–400)
RBC: 3.77 MIL/uL — ABNORMAL LOW (ref 3.87–5.11)
RDW: 13.8 % (ref 11.5–15.5)
WBC: 10.3 10*3/uL (ref 4.0–10.5)
nRBC: 0 % (ref 0.0–0.2)

## 2022-09-29 NOTE — Progress Notes (Signed)
Progress Note: General Surgery Service   Chief Complaint/Subjective: Doesn't feel good, but bloating better, had a bowel movement yesterday.  Some nausea.    Objective: Vital signs in last 24 hours: Temp:  [98 F (36.7 C)-98.7 F (37.1 C)] 98 F (36.7 C) (10/26 0556) Pulse Rate:  [81-103] 97 (10/26 0556) Resp:  [18] 18 (10/25 2202) BP: (136-148)/(69-73) 148/73 (10/26 0556) SpO2:  [86 %-94 %] 91 % (10/26 0556) Last BM Date :  (hx colostomy takedown.)  Intake/Output from previous day: 10/25 0701 - 10/26 0700 In: 3096 [P.O.:240; I.V.:2856] Out: 0  Intake/Output this shift: No intake/output data recorded.  GI: Abd Incision c/d/I w/ glue,Bandage over ostomy site  Lab Results: CBC  Recent Labs    09/28/22 0420 09/29/22 0415  WBC 8.8 10.3  HGB 10.7* 12.2  HCT 31.6* 36.0  PLT 166 201    BMET Recent Labs    09/28/22 0420 09/29/22 0415  NA 136 138  K 3.3* 3.7  CL 104 104  CO2 25 25  GLUCOSE 120* 140*  BUN 17 17  CREATININE 0.79 0.72  CALCIUM 8.1* 8.3*    PT/INR No results for input(s): "LABPROT", "INR" in the last 72 hours. ABG No results for input(s): "PHART", "HCO3" in the last 72 hours.  Invalid input(s): "PCO2", "PO2"  Anti-infectives: Anti-infectives (From admission, onward)    Start     Dose/Rate Route Frequency Ordered Stop   09/26/22 0804  sodium chloride 0.9 % with cefoTEtan (CEFOTAN) ADS Med       Note to Pharmacy: Jasmine Pang K: cabinet override      09/26/22 0804 09/26/22 0809       Medications: Scheduled Meds:  acetaminophen  650 mg Oral Q6H   alvimopan  12 mg Oral BID   amLODipine  5 mg Oral Daily   aspirin EC  81 mg Oral Daily   atorvastatin  20 mg Oral Daily   carvedilol  3.125 mg Oral BID WC   docusate sodium  100 mg Oral BID   DULoxetine  60 mg Oral QPM   enoxaparin (LOVENOX) injection  40 mg Subcutaneous Q24H   gabapentin  300 mg Oral TID   loratadine  10 mg Oral Daily   pantoprazole  40 mg Oral Daily   psyllium  1  packet Oral BID   ramipril  10 mg Oral Daily   Continuous Infusions:  lactated ringers 75 mL/hr at 09/28/22 0500   methocarbamol (ROBAXIN) IV     PRN Meds:.Azelaic Acid, clobetasol cream, HYDROmorphone (DILAUDID) injection, methocarbamol (ROBAXIN) IV, ondansetron (ZOFRAN) IV, oxyCODONE, oxyCODONE, prochlorperazine, simethicone  Assessment/Plan: s/p Procedure(s): ROBOTIC OSTOMY REVERSAL 09/26/2022  Doing okay POD3 Advance diet Increase activity - PT/OT Okay for straws     LOS: 3 days    Felicie Morn, MD  [facility] Use [email or web address] to contact on call provider  Daily Billing: [phone] - post op

## 2022-09-29 NOTE — TOC Initial Note (Addendum)
Transition of Care [facility]) - Initial/Assessment Note    Patient Details  Name: Terri Rodriguez MRN: [id number] Date of Birth: 10/13/1946  Transition of Care [facility]) CM/SW Contact:    Henrietta Dine, RN Phone Number: 09/29/2022, 3:17 PM  Clinical Narrative:                 [facility] consult for HHPT; spoke with pt in room; the pt says she lives at home with her husband and she plans to return at discharge; the pt says she uses a cane, and wears glasses; she also says she wears hearing aids; the pt says she has transportation home and to her appts; she is agreeable to [facility]; spoke with Claiborne Billings at [place] and the agency is able to accept the pt; [place] contact information added to follow up providers; TOC will con't to follow.  Expected Discharge Plan: [place] Barriers to Discharge: Continued Medical Work up   Patient Goals and CMS Choice Patient states their goals for this hospitalization and ongoing recovery are:: home CMS Medicare.gov Compare Post Acute Care list provided to:: Patient Choice offered to / list presented to : Patient, Spouse  Expected Discharge Plan and Services Expected Discharge Plan: [place]   Discharge Planning Services: CM Consult   Living arrangements for the past 2 months: Single Family Home                           HH Arranged: PT HH Agency: [facility] Date [place]: 09/29/22 Time [place]: [id number] Representative spoke with at [place]: Claiborne Billings  Prior Living Arrangements/Services Living arrangements for the past 2 months: [place] with:: Spouse Patient language and need for interpreter reviewed:: Yes Do you feel safe going back to the place where you live?: Yes      Need for Family Participation in Patient Care: Yes (Comment) Care giver support system in place?: Yes (comment)   Criminal Activity/Legal Involvement Pertinent to Current  Situation/Hospitalization: No - Comment as needed  Activities of Daily Living Home Assistive Devices/Equipment: Cane (specify quad or straight) ADL Screening (condition at time of admission) Patient's cognitive ability adequate to safely complete daily activities?: Yes Is the patient deaf or have difficulty hearing?: Yes Does the patient have difficulty seeing, even when wearing glasses/contacts?: No Does the patient have difficulty concentrating, remembering, or making decisions?: Yes Patient able to express need for assistance with ADLs?: Yes Does the patient have difficulty dressing or bathing?: No Independently performs ADLs?: Yes (appropriate for developmental age) Does the patient have difficulty walking or climbing stairs?: No Weakness of Legs: None Weakness of Arms/Hands: None  Permission Sought/Granted Permission sought to share information with : Case Manager Permission granted to share information with : Yes, Verbal Permission Granted  Share Information with NAME: Lenor Coffin, RN, CM           Emotional Assessment Appearance:: Appears stated age Attitude/Demeanor/Rapport: Gracious Affect (typically observed): Accepting Orientation: : Oriented to Self, Oriented to Place, Oriented to  Time, Oriented to Situation Alcohol / Substance Use: Not Applicable Psych Involvement: No (comment)  Admission diagnosis:  History of creation of ostomy [facility]) [Z93.9] Patient Active Problem List   Diagnosis Date Noted   History of creation of ostomy ([place]) 09/26/2022   Colostomy in place [facility]) 01/09/2022   IBS (irritable bowel syndrome) 01/05/2022   Physical deconditioning 01/05/2022   Perforation of colon s/p  Hartmann colectomy/colostomy 01/03/2022 01/02/2022   Lumbar post-laminectomy syndrome 10/07/2021   Cervical spondylosis without myelopathy 10/07/2021   Primary osteoarthritis of both feet 06/18/2017   Status post bilateral total hip replacement 06/18/2017   Status post total  knee replacement, bilateral 06/18/2017   Degenerative disc disease, lumbar status post fusion 06/18/2017   Degenerative disc disease, cervical 06/18/2017   Raynaud's disease without gangrene 06/18/2017   History of cholelithiasis 06/18/2017   Other chronic pain 02/01/2017   Visit for preventive health examination 01/21/2015   Medicare annual wellness visit, subsequent 01/21/2015   BMI 40.0-44.9, adult ([place]) 01/21/2015   Hyperlipidemia LDL goal <70 01/12/2015   Morbid obesity ([place]) 01/12/2015   Hyperglycemia 01/10/2014   Mild CAD 01/06/2014   Medication monitoring encounter 09/22/2012   Hx of cardiac catheterization    Postmenopausal HRT (hormone replacement therapy) 09/20/2012   Adenomatous polyp of colon 06/14/2012   ROSACEA 04/26/2010   Obstructive sleep apnea 09/09/2009   Normocytic anemia 12/15/2008   LEUKOPENIA, CHRONIC 01/08/2008   Elevated lipids 08/17/2007   ALLERGIC RHINITIS 08/17/2007   GERD 08/17/2007   Primary osteoarthritis of both hands 08/17/2007   PCP:  Burnis Medin, MD Pharmacy:   [facility] [place], [place] [address] [place] [address] Phone: [phone] Fax: [phone]  [place], [place] - [address] [place] [address] Phone: [phone] Fax: [phone]  [id number] [place], [id number] [id number]: [phone]: [phone]    Social Determinants of Health (SDOH) Interventions Food Insecurity Interventions: Patient Refused Housing Interventions: Patient Refused Transportation Interventions: Intervention Not Indicated Utilities Interventions: Patient Refused  Readmission Risk Interventions     No data to display

## 2022-09-29 NOTE — Progress Notes (Signed)
Physical Therapy Treatment Patient Details Name: Terri Rodriguez MRN: [id number] DOB: 06/12/1946 Today's Date: 09/29/2022   History of Present Illness  76 yo female s/p ostomy reversal 09/27/22. Hx of colostomy 12/2021, bil THA, CAD, falls, chronic leukopenia, L LE pain    PT Comments    Patient making progress with mobility, requires min assist for transfers and gait with moderate encouragement required to increase ambulation. Pt walked short bout to bathroom and back to chair requiring seated rest due to fatigue. Pt was able to ambulate ~200' in hallway with standing rest breaks only, SpO2 97% on RA and HR 110's throughout. Chair follow provided for safety. EOS pt positioned in chair position and educated on importance of upright activity and time OOB and encouraged to use bathroom and eat meals OOB.    Recommendations for follow up therapy are one component of a multi-disciplinary discharge planning process, led by the attending physician.  Recommendations may be updated based on patient status, additional functional criteria and insurance authorization. PT Recommendation   Follow Up Recommendations Home health PT Filed 09/29/2022 1140  Assistance recommended at discharge Intermittent Supervision/Assistance Filed 09/29/2022 1140  Patient can return home with the following A little help with walking and/or transfers, A little help with bathing/dressing/bathroom, Help with stairs or ramp for entrance, Assistance with cooking/housework, Assist for transportation Filed 09/29/2022 1140  Functional Status Assessment Patient has had a recent decline in their functional status and demonstrates the ability to make significant improvements in function in a reasonable and predictable amount of time. Filed 09/27/2022 1054  PT equipment None recommended by PT Filed 09/29/2022 1140    Mobility  Bed Mobility Overal bed mobility: Needs Assistance Bed Mobility: Sit to Supine       Sit to supine: Min  assist, HOB elevated   General bed mobility comments: pt required cues for log roll technique and assist to bring LE's onto bed and reposition in bed at EOS.    Transfers Overall transfer level: Needs assistance Equipment used: Rolling walker (2 wheels) Transfers: Sit to/from Stand Sit to Stand: Min assist, Min guard           General transfer comment: cues for safety with hand placement for power up. pt required min assist to rise from recliner 1x and min guard for power up from toilet.    Ambulation/Gait Ambulation/Gait assistance: Min assist Gait Distance (Feet): 200 Feet Assistive device: Rolling walker (2 wheels) Gait Pattern/deviations: Decreased step length - right, Decreased step length - left, Decreased stride length Gait velocity: decr     General Gait Details: pt ambulated into bathroom and back to recliner, seated rest required before ambulation in hallway. Min assist to guide/steady walker. pt required multiple standing rest breaks due to fatigue. overall steps shuffled with low hip flexion/poor foot clearance and cues for heel to toe pattern did not make significant change to step quality. EOS pt returned to bed to rest.   Stairs             Wheelchair Mobility    Modified Rankin (Stroke Patients Only)       Balance Overall balance assessment: Needs assistance Sitting-balance support: Feet supported Sitting balance-Leahy Scale: Fair     Standing balance support: Bilateral upper extremity supported, Reliant on assistive device for balance, During functional activity Standing balance-Leahy Scale: Poor  Cognition Arousal/Alertness: Awake/alert Behavior During Therapy: WFL for tasks assessed/performed Overall Cognitive Status: Within Functional Limits for tasks assessed                                          Exercises      General Comments        Pertinent Vitals/Pain Pain  Assessment Pain Assessment: Faces Faces Pain Scale: Hurts little more Pain Location: abdomen Pain Descriptors / Indicators: Discomfort, Sore, Tightness Pain Intervention(s): Limited activity within patient's tolerance, Monitored during session, Repositioned      09/29/22 1140  PT - End of Session  Equipment Utilized During Treatment Gait belt  Activity Tolerance Patient tolerated treatment well  Patient left with call bell/phone within reach;in bed;with bed alarm set;with family/visitor present  Nurse Communication Mobility status   PT - Assessment/Plan  PT Plan Current plan remains appropriate  PT Visit Diagnosis Pain;Difficulty in walking, not elsewhere classified (R26.2);History of falling (Z91.81)  PT Frequency (ACUTE ONLY) Min 3X/week  Follow Up Recommendations Home health PT  Assistance recommended at discharge Intermittent Supervision/Assistance  Patient can return home with the following A little help with walking and/or transfers;A little help with bathing/dressing/bathroom;Help with stairs or ramp for entrance;Assistance with cooking/housework;Assist for transportation  PT equipment None recommended by PT  AM-PAC PT "6 Clicks" Mobility Outcome Measure (Version 2)  Help needed turning from your back to your side while in a flat bed without using bedrails? 3  Help needed moving from lying on your back to sitting on the side of a flat bed without using bedrails? 3  Help needed moving to and from a bed to a chair (including a wheelchair)? 3  Help needed standing up from a chair using your arms (e.g., wheelchair or bedside chair)? 3  Help needed to walk in hospital room? 3  Help needed climbing 3-5 steps with a railing?  2  6 Click Score 17  Consider Recommendation of Discharge To: Home with [facility]  Progressive Mobility  What is the highest level of mobility based on the progressive mobility assessment? Level 5 (Walks with assist in room/hall) - Balance while stepping forward/back  and can walk in room with assist - Complete  Mobility Referral No  PT Goal Progression  Progress towards PT goals Progressing toward goals  Acute Rehab PT Goals  PT Goal Formulation With patient  Time For Goal Achievement 10/11/22  Potential to Achieve Goals Good  PT Time Calculation  PT Start Time (ACUTE ONLY) 1132  PT Stop Time (ACUTE ONLY) 1201  PT Time Calculation (min) (ACUTE ONLY) 29 min  PT General Charges  $$ ACUTE PT VISIT 1 Visit  PT Treatments  $Gait Training 8-22 mins  $Therapeutic Activity 8-22 mins     Verner Mould, DPT Acute Rehabilitation Services Office [phone]  09/29/22 4:24 PM

## 2022-09-29 NOTE — Progress Notes (Signed)
Occupational Therapy Treatment Patient Details Name: Terri Rodriguez MRN: [id number] DOB: 1946/02/13 Today's Date: 09/29/2022   History of present illness 76 yo female s/p ostomy reversal 09/27/22. Hx of colostomy 12/2021, bil THA, CAD, falls, chronic leukopenia, L LE pain   OT comments  Patient found supine in bed and reporting incontinent of watery stool. Patient min assist to transfer to edge of bed and then min assist to pivot to [facility]. Patient max assist for lower body bathing task.Once clean patient able to take steps to recliner with min assist. Patient making sound as if she is in pain but only reports reflux as the issue. She reports fatigue after limited activity. Able to perform grooming task in recliner. She reported feeling woozy and BP found to be 163/82. She and her husband reported odd reactions to medicine - and that she is seeing colors. Today she reported the wall was green. Overall she is making progressing though still limited. Continue POC.    Recommendations for follow up therapy are one component of a multi-disciplinary discharge planning process, led by the attending physician.  Recommendations may be updated based on patient status, additional functional criteria and insurance authorization.    Follow Up Recommendations  Home health OT    Assistance Recommended at Discharge Frequent or constant Supervision/Assistance  Patient can return home with the following  A lot of help with walking and/or transfers;A lot of help with bathing/dressing/bathroom;Assist for transportation;Direct supervision/assist for financial management;Assistance with cooking/housework;Direct supervision/assist for medications management   Equipment Recommendations  None recommended by OT    Recommendations for Other Services      Precautions / Restrictions Precautions Precautions: Fall Restrictions Weight Bearing Restrictions: No       Mobility Bed Mobility                     Transfers                         Balance Overall balance assessment: Mild deficits observed, not formally tested                                         ADL either performed or assessed with clinical judgement   ADL Overall ADL's : Needs assistance/impaired     Grooming: Set up;Sitting;Oral care Grooming Details (indicate cue type and reason): sitting in recliner     Lower Body Bathing: Maximal assistance;Sit to/from stand Lower Body Bathing Details (indicate cue type and reason): needed assistasnce to wash lower legs and thighs. Patient able to attempt wiping periarea but struggled     Lower Body Dressing: Maximal assistance Lower Body Dressing Details (indicate cue type and reason): needed assistance to don socks Toilet Transfer: Minimal assistance;BSC/3in1 Toilet Transfer Details (indicate cue type and reason): pivoted to [place] and Hygiene: Total assistance;Sitting/lateral lean Toileting - Clothing Manipulation Details (indicate cue type and reason): on [facility] after diarrhea incontinent episode     Functional mobility during ADLs: Minimal assistance General ADL Comments: Mina ssist to transfer to edge of bed for hand hold. Min assist to pivot to [facility] and then take steps to recliner. Limited mobilty today.    Extremity/Trunk Assessment              Vision Baseline Vision/History: 1 Wears glasses     Perception     Praxis  Cognition Arousal/Alertness: Awake/alert Behavior During Therapy: WFL for tasks assessed/performed Overall Cognitive Status: Within Functional Limits for tasks assessed                                          Exercises      Shoulder Instructions       General Comments      Pertinent Vitals/ Pain       Pain Assessment Pain Assessment: Faces Faces Pain Scale: Hurts a little bit Pain Location: reflux pain Pain Descriptors / Indicators: Discomfort, Sore,  Tightness Pain Intervention(s): Limited activity within patient's tolerance  Home Living                                          Prior Functioning/Environment              Frequency  Min 2X/week        Progress Toward Goals  OT Goals(current goals can now be found in the care plan section)  Progress towards OT goals: Progressing toward goals     Plan Discharge plan remains appropriate    Co-evaluation                 AM-PAC OT "6 Clicks" Daily Activity     Outcome Measure   Help from another person eating meals?: A Little Help from another person taking care of personal grooming?: A Little Help from another person toileting, which includes using toliet, bedpan, or urinal?: A Lot Help from another person bathing (including washing, rinsing, drying)?: A Lot Help from another person to put on and taking off regular upper body clothing?: A Little Help from another person to put on and taking off regular lower body clothing?: A Lot 6 Click Score: 15    End of Session Equipment Utilized During Treatment: Rolling walker (2 wheels)  OT Visit Diagnosis: Unsteadiness on feet (R26.81);History of falling (Z91.81);Muscle weakness (generalized) (M62.81);Pain   Activity Tolerance Patient limited by fatigue   Patient Left in chair;with call bell/phone within reach;with family/visitor present   Nurse Communication Mobility status (high BP)        Time: 5035-4656 OT Time Calculation (min): 27 min  Charges: OT General Charges $OT Visit: 1 Visit OT Treatments $Self Care/Home Management : 23-37 mins  Gustavo Lah, OTR/L [place]  Office [phone]   Lenward Chancellor 09/29/2022, 12:14 PM

## 2022-09-30 LAB — BASIC METABOLIC PANEL
Anion gap: 7 (ref 5–15)
BUN: 18 mg/dL (ref 8–23)
CO2: 25 mmol/L (ref 22–32)
Calcium: 7.9 mg/dL — ABNORMAL LOW (ref 8.9–10.3)
Chloride: 105 mmol/L (ref 98–111)
Creatinine, Ser: 0.68 mg/dL (ref 0.44–1.00)
GFR, Estimated: >60 mL/min (ref >60)
Glucose, Bld: 125 mg/dL — ABNORMAL HIGH (ref 70–99)
Potassium: 3.6 mmol/L (ref 3.5–5.1)
Sodium: 137 mmol/L (ref 135–145)

## 2022-09-30 LAB — CBC
HCT: 30.2 % — ABNORMAL LOW (ref 36.0–46.0)
Hemoglobin: 10.3 g/dL — ABNORMAL LOW (ref 12.0–15.0)
MCH: 32.6 pg (ref 26.0–34.0)
MCHC: 34.1 g/dL (ref 30.0–36.0)
MCV: 95.6 fL (ref 80.0–100.0)
Platelets: 202 10*3/uL (ref 150–400)
RBC: 3.16 MIL/uL — ABNORMAL LOW (ref 3.87–5.11)
RDW: 13.9 % (ref 11.5–15.5)
WBC: 5.8 10*3/uL (ref 4.0–10.5)
nRBC: 0 % (ref 0.0–0.2)

## 2022-09-30 MED ORDER — OXYCODONE-ACETAMINOPHEN 5-325 MG PO TABS: 1.0000 | ORAL_TABLET | ORAL | 0 refills | Status: DC | PRN

## 2022-09-30 NOTE — Progress Notes (Signed)
Reviewed discharge instructions with pt and her husband and all questions answered. They verbalized understanding. Discharged via wheelchair with all belongings in stable condition.

## 2022-09-30 NOTE — Care Management Important Message (Signed)
Important Message  Patient Details IM Letter given to the Patient. Name: Terri Rodriguez MRN: [id number] Date of Birth: 29-Sep-1946   Medicare Important Message Given:  Yes     Kerin Salen 09/30/2022, 11:18 AM

## 2022-09-30 NOTE — Discharge Summary (Signed)
Patient ID: Terri Rodriguez [id number] 76 y.o. 1946/10/10  09/26/2022  Discharge date and time: 09/30/2022  Admitting Physician: Terri Rodriguez  Discharge Physician: Terri Rodriguez  Admission Diagnoses: History of creation of ostomy [facility]) [Z93.9] Patient Active Problem List   Diagnosis Date Noted   History of creation of ostomy ([facility]) 09/26/2022   Colostomy in place [facility]) 01/09/2022   IBS (irritable bowel syndrome) 01/05/2022   Physical deconditioning 01/05/2022   Perforation of colon s/p Hartmann colectomy/colostomy 01/03/2022 01/02/2022   Lumbar post-laminectomy syndrome 10/07/2021   Cervical spondylosis without myelopathy 10/07/2021   Primary osteoarthritis of both feet 06/18/2017   Status post bilateral total hip replacement 06/18/2017   Status post total knee replacement, bilateral 06/18/2017   Degenerative disc disease, lumbar status post fusion 06/18/2017   Degenerative disc disease, cervical 06/18/2017   Raynaud's disease without gangrene 06/18/2017   History of cholelithiasis 06/18/2017   Other chronic pain 02/01/2017   Visit for preventive health examination 01/21/2015   Medicare annual wellness visit, subsequent 01/21/2015   BMI 40.0-44.9, adult ([place]) 01/21/2015   Hyperlipidemia LDL goal <70 01/12/2015   Morbid obesity ([place]) 01/12/2015   Hyperglycemia 01/10/2014   Mild CAD 01/06/2014   Medication monitoring encounter 09/22/2012   Hx of cardiac catheterization    Postmenopausal HRT (hormone replacement therapy) 09/20/2012   Adenomatous polyp of colon 06/14/2012   ROSACEA 04/26/2010   Obstructive sleep apnea 09/09/2009   Normocytic anemia 12/15/2008   LEUKOPENIA, CHRONIC 01/08/2008   Elevated lipids 08/17/2007   ALLERGIC RHINITIS 08/17/2007   GERD 08/17/2007   Primary osteoarthritis of both hands 08/17/2007     Discharge Diagnoses:  Patient Active Problem List   Diagnosis Date Noted   History of creation of ostomy ([place]) 09/26/2022   Colostomy  in place [facility]) 01/09/2022   IBS (irritable bowel syndrome) 01/05/2022   Physical deconditioning 01/05/2022   Perforation of colon s/p Hartmann colectomy/colostomy 01/03/2022 01/02/2022   Lumbar post-laminectomy syndrome 10/07/2021   Cervical spondylosis without myelopathy 10/07/2021   Primary osteoarthritis of both feet 06/18/2017   Status post bilateral total hip replacement 06/18/2017   Status post total knee replacement, bilateral 06/18/2017   Degenerative disc disease, lumbar status post fusion 06/18/2017   Degenerative disc disease, cervical 06/18/2017   Raynaud's disease without gangrene 06/18/2017   History of cholelithiasis 06/18/2017   Other chronic pain 02/01/2017   Visit for preventive health examination 01/21/2015   Medicare annual wellness visit, subsequent 01/21/2015   BMI 40.0-44.9, adult ([place]) 01/21/2015   Hyperlipidemia LDL goal <70 01/12/2015   Morbid obesity ([place]) 01/12/2015   Hyperglycemia 01/10/2014   Mild CAD 01/06/2014   Medication monitoring encounter 09/22/2012   Hx of cardiac catheterization    Postmenopausal HRT (hormone replacement therapy) 09/20/2012   Adenomatous polyp of colon 06/14/2012   ROSACEA 04/26/2010   Obstructive sleep apnea 09/09/2009   Normocytic anemia 12/15/2008   LEUKOPENIA, CHRONIC 01/08/2008   Elevated lipids 08/17/2007   ALLERGIC RHINITIS 08/17/2007   GERD 08/17/2007   Primary osteoarthritis of both hands 08/17/2007    Operations: Procedure(s): ROBOTIC OSTOMY REVERSAL  Admission Condition: good  Discharged Condition: good  Indication for Admission: [facility] Course: Terri Rodriguez is a young at heart 76 year old patient who underwent robotic ostomy reversal and recovered well in the hospital.  She was discharged with home health PT due to baseline mobility issues being exacerbated by abdominal surgery.  Consults: None  Significant Diagnostic Studies: none  Treatments: surgery: as above  Disposition:  Home  Patient  Instructions:  Allergies as of 09/30/2022       Reactions   Codeine Nausea Only   Crab [shellfish Allergy] Itching, Nausea And Vomiting        Medication List     TAKE these medications    acetaminophen 500 MG tablet Commonly known as: TYLENOL Take 1 tablet (500 mg total) by mouth every 6 (six) hours as needed for fever or mild pain.   amLODipine 5 MG tablet Commonly known as: NORVASC TAKE 1 TABLET BY MOUTH EVERY DAY   amoxicillin 500 MG capsule Commonly known as: AMOXIL Take 2,000 mg by mouth See admin instructions. Take 2000 mg by mouth 1 hour prior to dental procedures.   aspirin EC 81 MG tablet Take 81 mg by mouth daily.   atorvastatin 20 MG tablet Commonly known as: LIPITOR TAKE 1 TABLET BY MOUTH EVERY DAY AT 6 PM   Azelaic Acid 15 % gel Apply 1 application topically 2 (two) times daily as needed (rosacea). After skin is thoroughly washed and patted dry, gently but thoroughly massage a thin film of azelaic acid cream into the affected area t daily,after shower.   carvedilol 3.125 MG tablet Commonly known as: COREG TAKE 1 TABLET BY MOUTH 2 TIMES DAILY WITH A meal   celecoxib 200 MG capsule Commonly known as: CELEBREX Take 200 mg by mouth every other day.   celecoxib 100 MG capsule Commonly known as: CELEBREX Take 100 mg by mouth every other day.   chlorhexidine 0.12 % solution Commonly known as: PERIDEX Use as directed 5 mLs in the mouth or throat 2 (two) times daily.   clindamycin 1 % external solution Commonly known as: CLEOCIN T Apply 1 Application topically 2 (two) times daily as needed (rosacea).   clobetasol 0.05 % external solution Commonly known as: TEMOVATE Apply 1 application topically daily as needed (itching).   diclofenac Sodium 1 % Gel Commonly known as: VOLTAREN Apply 2 g topically daily as needed (pain).   DULoxetine 60 MG capsule Commonly known as: CYMBALTA TAKE 1 CAPSULE BY MOUTH EVERY DAY What changed:  how much to take when  to take this   estradiol 0.5 MG tablet Commonly known as: ESTRACE 1/2 tab daily What changed:  how much to take how to take this when to take this additional instructions   fexofenadine 180 MG tablet Commonly known as: ALLEGRA Take 180 mg by mouth daily.   fluocinonide 0.05 % external solution Commonly known as: LIDEX 1 application daily as needed (scalp).   ketoconazole 2 % cream Commonly known as: NIZORAL 1 application daily as needed for irritation.   metroNIDAZOLE 0.75 % gel Commonly known as: METROGEL Apply 1 Application topically daily as needed (rosacea).   multivitamin with minerals Tabs tablet Take 1 tablet by mouth daily.   nitroGLYCERIN 0.4 MG SL tablet Commonly known as: NITROSTAT PLACE 1 TABLET UNDER THE TONGUE EVERY 5 MINUTES AS NEEDED FOR CHEST PAIN What changed:  how much to take when to take this reasons to take this additional instructions   omeprazole 40 MG capsule Commonly known as: PRILOSEC Take 40 mg by mouth daily.   oxyCODONE-acetaminophen 5-325 MG tablet Commonly known as: Percocet Take 1 tablet by mouth every 4 (four) hours as needed for severe pain.   psyllium 0.52 g capsule Commonly known as: REGULOID Take 0.52 g by mouth 2 (two) times daily.   ramipril 10 MG capsule Commonly known as: ALTACE TAKE 1 CAPSULE BY MOUTH EVERY DAY  Tums Ultra 1000 400 MG chewable tablet Generic drug: calcium elemental as carbonate Chew 1,000 mg by mouth daily as needed for heartburn.   valACYclovir 1000 MG tablet Commonly known as: VALTREX TAKE 2 TABLET BY MOUTH Q12 hours X 2 DOSES WITH FEVER BLISTERS        Activity: no heavy lifting for 4 weeks Diet: regular diet Wound Care: keep wound clean and dry, change packing at ostomy site daily  Follow-up:  With Dr. Thermon Leyland in 4 weeks.  Signed: Nickola Major Atina Feeley [facility], Bariatric, [facility] Minimally Invasive Surgery [facility], [place]   09/30/2022, 2:42 PM

## 2022-09-30 NOTE — Progress Notes (Signed)
Physical Therapy Treatment Patient Details Name: Terri Rodriguez MRN: [id number] DOB: 1946/07/03 Today's Date: 09/30/2022   History of Present Illness 76 yo female s/p ostomy reversal 09/27/22. Hx of colostomy 12/2021, bil THA, CAD, falls, chronic leukopenia, L LE pain    PT Comments    POD x3 pm session. MD cleared patient for discharge during session. Patient was able to pull herself into sitting using handrail, required HHA to sit upright on edge of bed. Required min guard for transfers. Ambulated to bathroom and was able to get up from toilet with no assistance. Trial ambulation w/ cane vs walker into hallway for another 75 ft requiring min assist. Fatigued half way through and started to use the rails to ambulate back to bed. Patient should continue to use RW. Plan is to discharge back home with family support.    Recommendations for follow up therapy are one component of a multi-disciplinary discharge planning process, led by the attending physician.  Recommendations may be updated based on patient status, additional functional criteria and insurance authorization.  Follow Up Recommendations  Home health PT     Assistance Recommended at Discharge    Patient can return home with the following A little help with walking and/or transfers;A little help with bathing/dressing/bathroom;Help with stairs or ramp for entrance;Assistance with cooking/housework;Assist for transportation   Equipment Recommendations  None recommended by PT    Recommendations for Other Services       Precautions / Restrictions Precautions Precautions: Fall Restrictions Weight Bearing Restrictions: No     Mobility  Bed Mobility Overal bed mobility: Needs Assistance Bed Mobility: Supine to Sit, Sit to Supine     Supine to sit: HOB elevated, Min assist Sit to supine: HOB elevated, Min guard   General bed mobility comments: Patient was able to pull herself into sitting using handrail, required HHA to get  to sitting upright on edge of bed    Transfers Overall transfer level: Needs assistance Equipment used: Straight cane Transfers: Sit to/from Stand Sit to Stand: Min guard           General transfer comment: cues for safety with hand placement for power up. Patient independently got up from toilet. Required min guard getting up from bed and sitting on toilet.    Ambulation/Gait Ambulation/Gait assistance: Min assist Gait Distance (Feet): 75 Feet Assistive device: Straight cane Gait Pattern/deviations: Decreased step length - right, Decreased step length - left, Decreased stride length, Step-through pattern, Trunk flexed Gait velocity: decr     General Gait Details: pt ambulated into bathroom and then our into hallway for 50 ft. Trial ambulation w/ cane vs. walker. Patient was stable for first half of ambulation and began to fatigue and grab onto rails in hallway during the 2nd half of ambulation. Cues needed to breathe.   Stairs             Wheelchair Mobility    Modified Rankin (Stroke Patients Only)       Balance                                            Cognition Arousal/Alertness: Awake/alert Behavior During Therapy: WFL for tasks assessed/performed Overall Cognitive Status: Within Functional Limits for tasks assessed  Exercises      General Comments        Pertinent Vitals/Pain Pain Assessment Pain Assessment: Faces Faces Pain Scale: Hurts a little bit Pain Location: reflux pain Pain Descriptors / Indicators: Discomfort, Sore, Tightness Pain Intervention(s): Monitored during session    Home Living Family/patient expects to be discharged to:: Private residence Living Arrangements: Spouse/significant other Available Help at Discharge: Family;Available 24 hours/day Type of Home: House Home Access: Stairs to enter   Entrance Stairs-Number of Steps: 1 Alternate Level  Stairs-Number of Steps: 16 Home Layout: Two level;Bed/bath upstairs Home Equipment: Rolling Walker (2 wheels);Cane - single point;Shower seat;Grab bars - tub/shower      Prior Function            PT Goals (current goals can now be found in the care plan section) Acute Rehab PT Goals Patient Stated Goal: less pain PT Goal Formulation: With patient Time For Goal Achievement: 10/11/22 Potential to Achieve Goals: Good    Frequency    Min 3X/week      PT Plan      Co-evaluation              AM-PAC PT "6 Clicks" Mobility   Outcome Measure  Help needed turning from your back to your side while in a flat bed without using bedrails?: A Little Help needed moving from lying on your back to sitting on the side of a flat bed without using bedrails?: A Little Help needed moving to and from a bed to a chair (including a wheelchair)?: A Little Help needed standing up from a chair using your arms (e.g., wheelchair or bedside chair)?: A Little Help needed to walk in hospital room?: A Little Help needed climbing 3-5 steps with a railing? : A Lot 6 Click Score: 17    End of Session Equipment Utilized During Treatment: Gait belt Activity Tolerance: Patient tolerated treatment well Patient left: with call bell/phone within reach;in bed;with bed alarm set Nurse Communication: Mobility status PT Visit Diagnosis: Pain;Difficulty in walking, not elsewhere classified (R26.2);History of falling (Z91.81)     Time: 8416-6063 PT Time Calculation (min) (ACUTE ONLY): 21 min  Charges:  $Gait Training: 8-22 mins                        Amylah Will 09/30/2022, 3:13 PM

## 2022-09-30 NOTE — Discharge Instructions (Signed)
POST OP INSTRUCTIONS AFTER COLON SURGERY  DIET: Be sure to include lots of fluids daily to stay hydrated - 64oz of water per day (8, 8 oz glasses).  Avoid fast food or heavy meals for the first couple of weeks as your are more likely to get nauseated. Avoid raw/uncooked fruits or vegetables for the first 4 weeks (its ok to have these if they are blended into smoothie form). If you have fruits/vegetables, make sure they are cooked until soft enough to mash on the roof of your mouth and chew your food well. Otherwise, diet as tolerated.  Take your usually prescribed home medications unless otherwise directed.  PAIN CONTROL: Pain is best controlled by a usual combination of three different methods TOGETHER: Ice/Heat Over the counter pain medication Prescription pain medication Most patients will experience some swelling and bruising around the surgical site.  Ice packs or heating pads (30-60 minutes up to 6 times a day) will help. Some people prefer to use ice alone, heat alone, alternating between ice & heat.  Experiment to what works for you.  Swelling and bruising can take several weeks to resolve.   It is helpful to take an over-the-counter pain medication regularly for the first few weeks: Ibuprofen (Motrin/Advil) - '200mg'$  tabs - take 3 tabs ('600mg'$ ) every 6 hours as needed for pain (unless you have been directed previously to avoid NSAIDs/ibuprofen) Acetaminophen (Tylenol) - you may take '650mg'$  every 6 hours as needed. You can take this with motrin as they act differently on the body. If you are taking a narcotic pain medication that has acetaminophen in it, do not take over the counter tylenol at the same time. NOTE: You may take both of these medications together - most patients  find it most helpful when alternating between the two (i.e. Ibuprofen at 6am, tylenol at 9am, ibuprofen at 12pm ..Marland Kitchen) A  prescription for pain medication should be given to you upon discharge.  Take your pain medication  as prescribed if your pain is not adequatly controlled with the over-the-counter pain reliefs mentioned above.  Avoid getting constipated.  Between the surgery and the pain medications, it is common to experience some constipation.  Increasing fluid intake and taking a fiber supplement (such as Metamucil, Citrucel, FiberCon, MiraLax, etc) 1-2 times a day regularly will usually help prevent this problem from occurring.  A mild laxative (prune juice, Milk of Magnesia, MiraLax, etc) should be taken according to package directions if there are no bowel movements after 48 hours.    Dressing: Your incisions are covered in Dermabond which is like sterile superglue for the skin. This will come off on it's own in a couple weeks. It is waterproof and you may shower normally starting the day after your surgery. Avoid baths/pools/lakes/oceans until your wounds have fully healed.  Remove the packing at your ostomy site in the shower, then replace it with a clean dressing afterwards.  ACTIVITIES as tolerated:   Avoid heavy lifting (>10lbs or 1 gallon of milk) for the next 6 weeks. You may resume regular daily activities as tolerated--such as daily self-care, walking, climbing stairs--gradually increasing activities as tolerated.  If you can walk 30 minutes without difficulty, it is safe to try more intense activity such as jogging, treadmill, bicycling, low-impact aerobics.  DO NOT PUSH THROUGH PAIN.  Let pain be your guide: If it hurts to do something, don't do it. You may drive when you are no longer taking prescription pain medication, you can comfortably wear  a seatbelt, and you can safely maneuver your car and apply brakes.  FOLLOW UP in our office Please call [facility] at [phone] to set up an appointment to see your surgeon in the office for a follow-up appointment approximately 2 weeks after your surgery. Make sure that you call for this appointment the day you arrive home to insure a convenient appointment  time.  9. If you have disability or family leave forms that need to be completed, you may have them completed by your primary care physician's office; for return to work instructions, please ask our office staff and they will be happy to assist you in obtaining this documentation   When to call us [phone]: Poor pain control Reactions / problems with new medications (rash/itching, etc)  Fever over 101.5 F (38.5 C) Inability to urinate Nausea/vomiting Worsening swelling or bruising Continued bleeding from incision. Increased pain, redness, or drainage from the incision  The clinic staff is available to answer your questions during regular business hours (8:30am-5pm).  Please don't hesitate to call and ask to speak to one of our nurses for clinical concerns.   A surgeon from [facility] is always on call at the hospitals   If you have a medical emergency, go to the nearest emergency room or call 911.  [facility], [place] [address], [place], [place],   [address] MAIN: [phone] FAX: [phone] [email or web address]

## 2022-10-05 DIAGNOSIS — I7300 Raynaud's syndrome without gangrene: Secondary | ICD-10-CM | POA: Diagnosis not present

## 2022-10-05 DIAGNOSIS — E785 Hyperlipidemia, unspecified: Secondary | ICD-10-CM | POA: Diagnosis not present

## 2022-10-05 DIAGNOSIS — D649 Anemia, unspecified: Secondary | ICD-10-CM | POA: Diagnosis not present

## 2022-10-05 DIAGNOSIS — H259 Unspecified age-related cataract: Secondary | ICD-10-CM | POA: Diagnosis not present

## 2022-10-05 DIAGNOSIS — F32A Depression, unspecified: Secondary | ICD-10-CM | POA: Diagnosis not present

## 2022-10-05 DIAGNOSIS — F419 Anxiety disorder, unspecified: Secondary | ICD-10-CM | POA: Diagnosis not present

## 2022-10-05 DIAGNOSIS — G4733 Obstructive sleep apnea (adult) (pediatric): Secondary | ICD-10-CM | POA: Diagnosis not present

## 2022-10-05 DIAGNOSIS — I2510 Atherosclerotic heart disease of native coronary artery without angina pectoris: Secondary | ICD-10-CM | POA: Diagnosis not present

## 2022-10-05 DIAGNOSIS — M961 Postlaminectomy syndrome, not elsewhere classified: Secondary | ICD-10-CM | POA: Diagnosis not present

## 2022-10-05 DIAGNOSIS — Z433 Encounter for attention to colostomy: Secondary | ICD-10-CM | POA: Diagnosis not present

## 2022-10-05 DIAGNOSIS — M5136 Other intervertebral disc degeneration, lumbar region: Secondary | ICD-10-CM | POA: Diagnosis not present

## 2022-10-05 DIAGNOSIS — Z48815 Encounter for surgical aftercare following surgery on the digestive system: Secondary | ICD-10-CM | POA: Diagnosis not present

## 2022-10-05 DIAGNOSIS — M5030 Other cervical disc degeneration, unspecified cervical region: Secondary | ICD-10-CM | POA: Diagnosis not present

## 2022-10-05 DIAGNOSIS — K8020 Calculus of gallbladder without cholecystitis without obstruction: Secondary | ICD-10-CM | POA: Diagnosis not present

## 2022-10-05 DIAGNOSIS — K219 Gastro-esophageal reflux disease without esophagitis: Secondary | ICD-10-CM | POA: Diagnosis not present

## 2022-10-05 DIAGNOSIS — E0590 Thyrotoxicosis, unspecified without thyrotoxic crisis or storm: Secondary | ICD-10-CM | POA: Diagnosis not present

## 2022-10-10 ENCOUNTER — Telehealth: Payer: Self-pay | Admitting: Internal Medicine

## 2022-10-10 NOTE — Telephone Encounter (Signed)
[name], a PT with [facility], called to get verbal orders for home health PT frequency being:  one time a week for one week   two times a week for four weeks  one time a week for four weeks       Good callback number is [phone]   Faythe Ghee to leave voicemails      Please advise

## 2022-10-11 NOTE — Telephone Encounter (Signed)
Ok to do pt orders

## 2022-10-11 NOTE — Telephone Encounter (Signed)
Contacted [name]. Left a voicemail to call us back.

## 2022-10-13 NOTE — Telephone Encounter (Signed)
Spoke to [place]. Inform her 'ok' per Dr. Regis Bill for verbal order.

## 2022-10-18 ENCOUNTER — Encounter: Payer: Self-pay | Admitting: Internal Medicine

## 2022-10-18 NOTE — Progress Notes (Signed)
[facility] 

## 2022-10-21 DIAGNOSIS — G4733 Obstructive sleep apnea (adult) (pediatric): Secondary | ICD-10-CM | POA: Diagnosis not present

## 2022-10-22 DIAGNOSIS — G4733 Obstructive sleep apnea (adult) (pediatric): Secondary | ICD-10-CM | POA: Diagnosis not present

## 2022-11-04 DIAGNOSIS — K8020 Calculus of gallbladder without cholecystitis without obstruction: Secondary | ICD-10-CM | POA: Diagnosis not present

## 2022-11-04 DIAGNOSIS — M5416 Radiculopathy, lumbar region: Secondary | ICD-10-CM | POA: Diagnosis not present

## 2022-11-04 DIAGNOSIS — E785 Hyperlipidemia, unspecified: Secondary | ICD-10-CM | POA: Diagnosis not present

## 2022-11-04 DIAGNOSIS — M5412 Radiculopathy, cervical region: Secondary | ICD-10-CM | POA: Diagnosis not present

## 2022-11-04 DIAGNOSIS — F32A Depression, unspecified: Secondary | ICD-10-CM | POA: Diagnosis not present

## 2022-11-04 DIAGNOSIS — Z48815 Encounter for surgical aftercare following surgery on the digestive system: Secondary | ICD-10-CM | POA: Diagnosis not present

## 2022-11-04 DIAGNOSIS — H259 Unspecified age-related cataract: Secondary | ICD-10-CM | POA: Diagnosis not present

## 2022-11-04 DIAGNOSIS — G4733 Obstructive sleep apnea (adult) (pediatric): Secondary | ICD-10-CM | POA: Diagnosis not present

## 2022-11-04 DIAGNOSIS — M4722 Other spondylosis with radiculopathy, cervical region: Secondary | ICD-10-CM | POA: Diagnosis not present

## 2022-11-04 DIAGNOSIS — K219 Gastro-esophageal reflux disease without esophagitis: Secondary | ICD-10-CM | POA: Diagnosis not present

## 2022-11-04 DIAGNOSIS — M5030 Other cervical disc degeneration, unspecified cervical region: Secondary | ICD-10-CM | POA: Diagnosis not present

## 2022-11-04 DIAGNOSIS — D649 Anemia, unspecified: Secondary | ICD-10-CM | POA: Diagnosis not present

## 2022-11-04 DIAGNOSIS — M961 Postlaminectomy syndrome, not elsewhere classified: Secondary | ICD-10-CM | POA: Diagnosis not present

## 2022-11-04 DIAGNOSIS — E0590 Thyrotoxicosis, unspecified without thyrotoxic crisis or storm: Secondary | ICD-10-CM | POA: Diagnosis not present

## 2022-11-04 DIAGNOSIS — F419 Anxiety disorder, unspecified: Secondary | ICD-10-CM | POA: Diagnosis not present

## 2022-11-04 DIAGNOSIS — Z433 Encounter for attention to colostomy: Secondary | ICD-10-CM | POA: Diagnosis not present

## 2022-11-04 DIAGNOSIS — I7300 Raynaud's syndrome without gangrene: Secondary | ICD-10-CM | POA: Diagnosis not present

## 2022-11-04 DIAGNOSIS — M5136 Other intervertebral disc degeneration, lumbar region: Secondary | ICD-10-CM | POA: Diagnosis not present

## 2022-11-04 DIAGNOSIS — I2510 Atherosclerotic heart disease of native coronary artery without angina pectoris: Secondary | ICD-10-CM | POA: Diagnosis not present

## 2022-11-20 DIAGNOSIS — G4733 Obstructive sleep apnea (adult) (pediatric): Secondary | ICD-10-CM | POA: Diagnosis not present

## 2022-11-23 DIAGNOSIS — M5126 Other intervertebral disc displacement, lumbar region: Secondary | ICD-10-CM | POA: Diagnosis not present

## 2022-11-23 DIAGNOSIS — M2578 Osteophyte, vertebrae: Secondary | ICD-10-CM | POA: Diagnosis not present

## 2022-11-23 DIAGNOSIS — M4802 Spinal stenosis, cervical region: Secondary | ICD-10-CM | POA: Diagnosis not present

## 2022-11-23 DIAGNOSIS — M4722 Other spondylosis with radiculopathy, cervical region: Secondary | ICD-10-CM | POA: Diagnosis not present

## 2022-11-23 DIAGNOSIS — M5416 Radiculopathy, lumbar region: Secondary | ICD-10-CM | POA: Diagnosis not present

## 2022-11-25 DIAGNOSIS — M4802 Spinal stenosis, cervical region: Secondary | ICD-10-CM | POA: Diagnosis not present

## 2022-11-25 DIAGNOSIS — Z6841 Body Mass Index (BMI) 40.0 and over, adult: Secondary | ICD-10-CM | POA: Diagnosis not present

## 2022-11-25 DIAGNOSIS — M5416 Radiculopathy, lumbar region: Secondary | ICD-10-CM | POA: Diagnosis not present

## 2022-11-30 ENCOUNTER — Telehealth: Payer: Self-pay | Admitting: Internal Medicine

## 2022-11-30 ENCOUNTER — Other Ambulatory Visit: Payer: Self-pay | Admitting: Family

## 2022-11-30 MED ORDER — OSELTAMIVIR PHOSPHATE 75 MG PO CAPS: 75.0000 mg | ORAL_CAPSULE | Freq: Every day | ORAL | 0 refills | Status: DC

## 2022-11-30 NOTE — Telephone Encounter (Signed)
Calling to check progress of this refill.  

## 2022-11-30 NOTE — Telephone Encounter (Signed)
error 

## 2022-11-30 NOTE — Telephone Encounter (Signed)
Patient exposed to family members with the flu 2 days ago. Requesting a preventive prescription of tamiflu. Declined OV  [facility] - [place], [place] - [address] Phone: [phone]  Fax: [phone]     

## 2022-12-01 ENCOUNTER — Other Ambulatory Visit: Payer: Self-pay | Admitting: Neurological Surgery

## 2022-12-01 NOTE — Telephone Encounter (Signed)
Called pt husband message given medication sent.

## 2022-12-02 ENCOUNTER — Other Ambulatory Visit: Payer: Self-pay | Admitting: Internal Medicine

## 2022-12-06 DIAGNOSIS — M5116 Intervertebral disc disorders with radiculopathy, lumbar region: Secondary | ICD-10-CM | POA: Diagnosis not present

## 2022-12-06 DIAGNOSIS — M5416 Radiculopathy, lumbar region: Secondary | ICD-10-CM | POA: Diagnosis not present

## 2022-12-09 ENCOUNTER — Other Ambulatory Visit: Payer: Self-pay

## 2022-12-09 ENCOUNTER — Encounter (HOSPITAL_COMMUNITY): Payer: Self-pay | Admitting: Neurological Surgery

## 2022-12-09 NOTE — Progress Notes (Signed)
PCP - Dr. Regis Bill  Cardiologist - Dr. Corky Downs  EP- Denies  Endocrine- Denies  Pulm- Denies  Chest x-ray - Denies  EKG - 12/13/22- Day of surgery  Stress Test - Denies  ECHO - 03/07/16 (E)  Cardiac Cath - 12/03/07 (E)  AICD-na PM-na LOOP-na  Nerve Stimulator- Denies  Dialysis- Denies  Sleep Study - Yes- Positive CPAP - Yes  LABS- 12/13/22: CBC, BMP, T/S, PCR  ASA- LD- 2 wks ago  ERAS- Yes- Clears until 0430  HA1C- Denies  Anesthesia- No  Pt denies having chest pain, sob, or fever during the pre-op phone call. All instructions explained to the pt, with a verbal understanding of the material. Pt also instructed to wear a mask and social distance if she goes out. The opportunity to ask questions was provided.

## 2022-12-09 NOTE — Progress Notes (Signed)
S.D.W- Instructions   Your procedure is scheduled on Tues., Jan. 9, 2024 from 7:30AM-11:06AM.  Report to [facility] Main Entrance "A" at 5:30 A.M., then check in with the Admitting office.  Call this number if you have problems the morning of surgery:  [phone]             If you experience any cold or flu symptoms such as cough, fever, chills, shortness of breath, etc. between now and your scheduled surgery, please notify us at the above         number.  Masks are now required throughout our facilities due to the increasing cases of Covid, Flu, and RSV infections.   Remember:  Do not eat after midnight on Jan. 8th  You may drink clear liquids until 3 hours (4:30AM) prior to the surgery time, the morning of your surgery.   Clear liquids allowed are: Water, Non-Citrus Juices (without pulp), Carbonated Beverages, Clear Tea, Black Coffee ONLY (NO MILK, CREAM OR POWDERED CREAMER of any kind), and Gatorade    Take these medicines the morning of surgery with A SIP OF WATER: Acetaminophen (TYLENOL)  Carvedilol (COREG)  Estradiol (ESTRACE)  Omeprazole (PRILOSEC)   As of today, STOP taking any Aspirin (unless otherwise instructed by your surgeon) Aleve, Celecoxib (CELEBREX), Naproxen, Ibuprofen, Motrin, Advil, Goody's, BC's, all herbal medications, fish oil, and all vitamins.          Do not wear jewelry or makeup. Do not wear lotions, powders, perfumes or deodorant. Do not shave 48 hours prior to surgery.  Do not bring valuables to the hospital. Do not wear nail polish, gel polish, artificial nails, or any other type of covering on natural nails (fingers and toes) If you have artificial nails or gel coating that need to be removed by a nail salon, please have this removed prior to surgery. Artificial nails or gel coating may interfere with anesthesia's ability to adequately monitor your vital signs.  [facility] is not responsible for any belongings or valuables.    Do NOT Smoke  (Tobacco/Vaping)  24 hours prior to your procedure  If you use a CPAP at night, you may bring your mask for your overnight stay.   Contacts, glasses, hearing aids, dentures or partials may not be worn into surgery, please bring cases for these belongings   For patients admitted to the hospital, discharge time will be determined by your treatment team.   Patients discharged the day of surgery will not be allowed to drive home, and someone needs to stay with them for 24 hours.  Special instructions:    Oral Hygiene is also important to reduce your risk of infection.  Remember - BRUSH YOUR TEETH THE MORNING OF SURGERY WITH YOUR REGULAR TOOTHPASTE  [facility]- Preparing For Surgery  Before surgery, you can play an important role. Because skin is not sterile, your skin needs to be as free of germs as possible. You can reduce the number of germs on your skin by washing with Antibacterial Soap before surgery.     Please follow these instructions carefully.     Shower the NIGHT BEFORE SURGERY and the MORNING OF SURGERY with Antibacterial Soap.   Pat yourself dry with a CLEAN TOWEL.  Wear CLEAN PAJAMAS to bed the night before surgery  Place CLEAN SHEETS on your bed the night before your surgery  DO NOT SLEEP WITH PETS.  Day of Surgery:  Take a shower with Antibacterial soap. Wear Clean/Comfortable clothing the morning of  surgery Do not apply any deodorants/lotions.   Remember to brush your teeth WITH YOUR REGULAR TOOTHPASTE.   If you test positive for Covid, or been in contact with anyone that has tested positive in the last 10 days, please notify your surgeon.  SURGICAL WAITING ROOM VISITATION Patients having surgery or a procedure may have no more than 2 support people in the waiting area - these visitors may rotate.   Children under the age of 36 must have an adult with them who is not the patient. If the patient needs to stay at the hospital during part of their recovery, the  visitor guidelines for inpatient rooms apply. Pre-op nurse will coordinate an appropriate time for 1 support person to accompany patient in pre-op.  This support person may not rotate.   Please refer to the [facility] website for the visitor guidelines for Inpatients (after your surgery is over and you are in a regular room).

## 2022-12-20 ENCOUNTER — Encounter (HOSPITAL_COMMUNITY)
Admission: RE | Disposition: A | Discharge status: Home / Self Care | Payer: Self-pay | Source: Home / Self Care | Attending: Neurological Surgery

## 2022-12-20 ENCOUNTER — Inpatient Hospital Stay (HOSPITAL_COMMUNITY): Payer: Medicare Other

## 2022-12-20 ENCOUNTER — Inpatient Hospital Stay (HOSPITAL_COMMUNITY): Payer: Medicare Other | Admitting: Registered Nurse

## 2022-12-20 ENCOUNTER — Inpatient Hospital Stay (HOSPITAL_COMMUNITY)
Admission: RE | Admit: 2022-12-20 | Discharge: 2022-12-21 | DRG: 473 | Disposition: A | Discharge status: Home / Self Care | Payer: Medicare Other | Attending: Neurological Surgery | Admitting: Neurological Surgery

## 2022-12-20 ENCOUNTER — Other Ambulatory Visit: Payer: Self-pay

## 2022-12-20 ENCOUNTER — Encounter (HOSPITAL_COMMUNITY): Payer: Self-pay | Admitting: Neurological Surgery

## 2022-12-20 DIAGNOSIS — I10 Essential (primary) hypertension: Secondary | ICD-10-CM | POA: Diagnosis not present

## 2022-12-20 DIAGNOSIS — M4712 Other spondylosis with myelopathy, cervical region: Principal | ICD-10-CM | POA: Diagnosis present

## 2022-12-20 DIAGNOSIS — Z8249 Family history of ischemic heart disease and other diseases of the circulatory system: Secondary | ICD-10-CM | POA: Diagnosis not present

## 2022-12-20 DIAGNOSIS — Z96653 Presence of artificial knee joint, bilateral: Secondary | ICD-10-CM | POA: Diagnosis present

## 2022-12-20 DIAGNOSIS — Z79899 Other long term (current) drug therapy: Secondary | ICD-10-CM | POA: Diagnosis not present

## 2022-12-20 DIAGNOSIS — M1990 Unspecified osteoarthritis, unspecified site: Secondary | ICD-10-CM | POA: Diagnosis not present

## 2022-12-20 DIAGNOSIS — M47816 Spondylosis without myelopathy or radiculopathy, lumbar region: Secondary | ICD-10-CM | POA: Diagnosis not present

## 2022-12-20 DIAGNOSIS — Z90710 Acquired absence of both cervix and uterus: Secondary | ICD-10-CM

## 2022-12-20 DIAGNOSIS — Z806 Family history of leukemia: Secondary | ICD-10-CM | POA: Diagnosis not present

## 2022-12-20 DIAGNOSIS — G959 Disease of spinal cord, unspecified: Principal | ICD-10-CM | POA: Diagnosis present

## 2022-12-20 DIAGNOSIS — M4802 Spinal stenosis, cervical region: Secondary | ICD-10-CM | POA: Diagnosis present

## 2022-12-20 DIAGNOSIS — Z96643 Presence of artificial hip joint, bilateral: Secondary | ICD-10-CM | POA: Diagnosis present

## 2022-12-20 DIAGNOSIS — G4733 Obstructive sleep apnea (adult) (pediatric): Secondary | ICD-10-CM | POA: Diagnosis not present

## 2022-12-20 DIAGNOSIS — Z833 Family history of diabetes mellitus: Secondary | ICD-10-CM

## 2022-12-20 DIAGNOSIS — M2578 Osteophyte, vertebrae: Secondary | ICD-10-CM | POA: Diagnosis not present

## 2022-12-20 DIAGNOSIS — G4730 Sleep apnea, unspecified: Secondary | ICD-10-CM | POA: Diagnosis not present

## 2022-12-20 DIAGNOSIS — Z7982 Long term (current) use of aspirin: Secondary | ICD-10-CM

## 2022-12-20 DIAGNOSIS — Z885 Allergy status to narcotic agent status: Secondary | ICD-10-CM

## 2022-12-20 DIAGNOSIS — Z8261 Family history of arthritis: Secondary | ICD-10-CM

## 2022-12-20 DIAGNOSIS — I2510 Atherosclerotic heart disease of native coronary artery without angina pectoris: Secondary | ICD-10-CM | POA: Diagnosis not present

## 2022-12-20 DIAGNOSIS — Z800 Family history of malignant neoplasm of digestive organs: Secondary | ICD-10-CM | POA: Diagnosis not present

## 2022-12-20 DIAGNOSIS — Z8049 Family history of malignant neoplasm of other genital organs: Secondary | ICD-10-CM

## 2022-12-20 DIAGNOSIS — Z91013 Allergy to seafood: Secondary | ICD-10-CM

## 2022-12-20 DIAGNOSIS — I25119 Atherosclerotic heart disease of native coronary artery with unspecified angina pectoris: Secondary | ICD-10-CM | POA: Diagnosis not present

## 2022-12-20 DIAGNOSIS — E785 Hyperlipidemia, unspecified: Secondary | ICD-10-CM | POA: Diagnosis not present

## 2022-12-20 DIAGNOSIS — Z86010 Personal history of colonic polyps: Secondary | ICD-10-CM | POA: Diagnosis not present

## 2022-12-20 DIAGNOSIS — M4322 Fusion of spine, cervical region: Secondary | ICD-10-CM | POA: Diagnosis not present

## 2022-12-20 DIAGNOSIS — M4722 Other spondylosis with radiculopathy, cervical region: Secondary | ICD-10-CM | POA: Diagnosis not present

## 2022-12-20 HISTORY — PX: ANTERIOR FUSION CERVICAL SPINE: SUR626

## 2022-12-20 HISTORY — PX: ANTERIOR CERVICAL DECOMP/DISCECTOMY FUSION: SHX1161

## 2022-12-20 LAB — CBC
HCT: 37.1 % (ref 36.0–46.0)
Hemoglobin: 12.7 g/dL (ref 12.0–15.0)
MCH: 32.8 pg (ref 26.0–34.0)
MCHC: 34.2 g/dL (ref 30.0–36.0)
MCV: 95.9 fL (ref 80.0–100.0)
Platelets: 222 10*3/uL (ref 150–400)
RBC: 3.87 MIL/uL (ref 3.87–5.11)
RDW: 14.6 % (ref 11.5–15.5)
WBC: 6 10*3/uL (ref 4.0–10.5)
nRBC: 0 % (ref 0.0–0.2)

## 2022-12-20 LAB — BASIC METABOLIC PANEL
Anion gap: 9 (ref 5–15)
BUN: 25 mg/dL — ABNORMAL HIGH (ref 8–23)
CO2: 23 mmol/L (ref 22–32)
Calcium: 9.2 mg/dL (ref 8.9–10.3)
Chloride: 106 mmol/L (ref 98–111)
Creatinine, Ser: 0.66 mg/dL (ref 0.44–1.00)
GFR, Estimated: >60 mL/min (ref >60)
Glucose, Bld: 110 mg/dL — ABNORMAL HIGH (ref 70–99)
Potassium: 4.3 mmol/L (ref 3.5–5.1)
Sodium: 138 mmol/L (ref 135–145)

## 2022-12-20 LAB — TYPE AND SCREEN
ABO/RH(D): O NEG
Antibody Screen: NEGATIVE

## 2022-12-20 LAB — SURGICAL PCR SCREEN
MRSA, PCR: NEGATIVE
Staphylococcus aureus: NEGATIVE

## 2022-12-20 SURGERY — ANTERIOR CERVICAL DECOMPRESSION/DISCECTOMY FUSION 3 LEVELS
Anesthesia: General

## 2022-12-20 MED ORDER — PHENOL 1.4 % MT LIQD: 1.0000 | OROMUCOSAL | Status: DC | PRN

## 2022-12-20 MED ORDER — FENTANYL CITRATE (PF) 250 MCG/5ML IJ SOLN
INTRAMUSCULAR | Status: DC | PRN
  Administered 2022-12-20: 100 ug via INTRAVENOUS
  Administered 2022-12-20 (×3): 50 ug via INTRAVENOUS

## 2022-12-20 MED ORDER — THROMBIN 5000 UNITS EX SOLR
CUTANEOUS | Status: AC
  Filled 2022-12-20: qty 5000

## 2022-12-20 MED ORDER — SODIUM CHLORIDE 0.9% FLUSH: 3.0000 mL | Freq: Two times a day (BID) | INTRAVENOUS | Status: DC

## 2022-12-20 MED ORDER — ORAL CARE MOUTH RINSE: 15.0000 mL | Freq: Once | OROMUCOSAL | Status: AC

## 2022-12-20 MED ORDER — PSYLLIUM 0.52 G PO CAPS: 0.5200 g | ORAL_CAPSULE | Freq: Two times a day (BID) | ORAL | Status: DC | PRN

## 2022-12-20 MED ORDER — LIDOCAINE-EPINEPHRINE 1 %-1:100000 IJ SOLN
INTRAMUSCULAR | Status: DC | PRN
  Administered 2022-12-20: 3 mL

## 2022-12-20 MED ORDER — MIDAZOLAM HCL 2 MG/2ML IJ SOLN
INTRAMUSCULAR | Status: AC
  Filled 2022-12-20: qty 2

## 2022-12-20 MED ORDER — MORPHINE SULFATE (PF) 2 MG/ML IV SOLN: 2.0000 mg | INTRAVENOUS | Status: DC | PRN

## 2022-12-20 MED ORDER — PHENYLEPHRINE 80 MCG/ML (10ML) SYRINGE FOR IV PUSH (FOR BLOOD PRESSURE SUPPORT)
PREFILLED_SYRINGE | INTRAVENOUS | Status: DC | PRN
  Administered 2022-12-20: 80 ug via INTRAVENOUS

## 2022-12-20 MED ORDER — PHENYLEPHRINE 80 MCG/ML (10ML) SYRINGE FOR IV PUSH (FOR BLOOD PRESSURE SUPPORT)
PREFILLED_SYRINGE | INTRAVENOUS | Status: AC
  Filled 2022-12-20: qty 10

## 2022-12-20 MED ORDER — DEXMEDETOMIDINE HCL IN NACL 80 MCG/20ML IV SOLN
INTRAVENOUS | Status: AC
  Filled 2022-12-20: qty 20

## 2022-12-20 MED ORDER — GLYCOPYRROLATE PF 0.2 MG/ML IJ SOSY
PREFILLED_SYRINGE | INTRAMUSCULAR | Status: DC | PRN
  Administered 2022-12-20: .1 mg via INTRAVENOUS

## 2022-12-20 MED ORDER — CEFAZOLIN SODIUM-DEXTROSE 2-4 GM/100ML-% IV SOLN
2.0000 g | Freq: Three times a day (TID) | INTRAVENOUS | Status: DC
  Administered 2022-12-20: 2 g via INTRAVENOUS
  Filled 2022-12-20: qty 100

## 2022-12-20 MED ORDER — SODIUM CHLORIDE 0.9% FLUSH: 3.0000 mL | INTRAVENOUS | Status: DC | PRN

## 2022-12-20 MED ORDER — ONDANSETRON HCL 4 MG/2ML IJ SOLN
INTRAMUSCULAR | Status: DC | PRN
  Administered 2022-12-20: 4 mg via INTRAVENOUS

## 2022-12-20 MED ORDER — LIDOCAINE 2% (20 MG/ML) 5 ML SYRINGE
INTRAMUSCULAR | Status: DC | PRN
  Administered 2022-12-20: 60 mg via INTRAVENOUS

## 2022-12-20 MED ORDER — HYDROMORPHONE HCL 1 MG/ML IJ SOLN
INTRAMUSCULAR | Status: AC
  Filled 2022-12-20: qty 0.5

## 2022-12-20 MED ORDER — AMISULPRIDE (ANTIEMETIC) 5 MG/2ML IV SOLN: 10.0000 mg | Freq: Once | INTRAVENOUS | Status: DC | PRN

## 2022-12-20 MED ORDER — CLOBETASOL PROPIONATE 0.05 % EX SOLN: 1.0000 | Freq: Every day | CUTANEOUS | Status: DC | PRN

## 2022-12-20 MED ORDER — CHLORHEXIDINE GLUCONATE 0.12 % MT SOLN
15.0000 mL | Freq: Once | OROMUCOSAL | Status: AC
  Administered 2022-12-20: 15 mL via OROMUCOSAL
  Filled 2022-12-20: qty 15

## 2022-12-20 MED ORDER — CEFAZOLIN SODIUM-DEXTROSE 2-4 GM/100ML-% IV SOLN
2.0000 g | INTRAVENOUS | Status: AC
  Administered 2022-12-20 (×2): 2 g via INTRAVENOUS
  Filled 2022-12-20: qty 100

## 2022-12-20 MED ORDER — ACETAMINOPHEN 650 MG RE SUPP: 650.0000 mg | RECTAL | Status: DC | PRN

## 2022-12-20 MED ORDER — FLEET ENEMA 7-19 GM/118ML RE ENEM: 1.0000 | ENEMA | Freq: Once | RECTAL | Status: DC | PRN

## 2022-12-20 MED ORDER — DULOXETINE HCL 30 MG PO CPEP
60.0000 mg | ORAL_CAPSULE | Freq: Every day | ORAL | Status: DC
  Administered 2022-12-20 – 2022-12-21 (×2): 60 mg via ORAL
  Filled 2022-12-20 (×2): qty 2

## 2022-12-20 MED ORDER — DEXAMETHASONE SODIUM PHOSPHATE 10 MG/ML IJ SOLN
INTRAMUSCULAR | Status: DC | PRN
  Administered 2022-12-20: 10 mg via INTRAVENOUS

## 2022-12-20 MED ORDER — ACETAMINOPHEN 10 MG/ML IV SOLN
INTRAVENOUS | Status: AC
  Filled 2022-12-20: qty 100

## 2022-12-20 MED ORDER — ACETAMINOPHEN 500 MG PO TABS: 500.0000 mg | ORAL_TABLET | Freq: Four times a day (QID) | ORAL | Status: DC | PRN

## 2022-12-20 MED ORDER — LACTATED RINGERS IV SOLN: INTRAVENOUS | Status: DC

## 2022-12-20 MED ORDER — RAMIPRIL 5 MG PO CAPS
10.0000 mg | ORAL_CAPSULE | Freq: Every day | ORAL | Status: DC
  Administered 2022-12-20 – 2022-12-21 (×2): 10 mg via ORAL
  Filled 2022-12-20 (×2): qty 2

## 2022-12-20 MED ORDER — BUPIVACAINE HCL (PF) 0.5 % IJ SOLN
INTRAMUSCULAR | Status: AC
  Filled 2022-12-20: qty 30

## 2022-12-20 MED ORDER — LIDOCAINE 2% (20 MG/ML) 5 ML SYRINGE
INTRAMUSCULAR | Status: AC
  Filled 2022-12-20: qty 5

## 2022-12-20 MED ORDER — PROPOFOL 10 MG/ML IV BOLUS
INTRAVENOUS | Status: DC | PRN
  Administered 2022-12-20: 150 mg via INTRAVENOUS
  Administered 2022-12-20: 25 ug/kg/min via INTRAVENOUS

## 2022-12-20 MED ORDER — ALUM & MAG HYDROXIDE-SIMETH 200-200-20 MG/5ML PO SUSP: 30.0000 mL | Freq: Four times a day (QID) | ORAL | Status: DC | PRN

## 2022-12-20 MED ORDER — PROPOFOL 10 MG/ML IV BOLUS
INTRAVENOUS | Status: AC
  Filled 2022-12-20: qty 20

## 2022-12-20 MED ORDER — MENTHOL 3 MG MT LOZG: 1.0000 | LOZENGE | OROMUCOSAL | Status: DC | PRN

## 2022-12-20 MED ORDER — METHOCARBAMOL 1000 MG/10ML IJ SOLN: 500.0000 mg | Freq: Four times a day (QID) | INTRAVENOUS | Status: DC | PRN

## 2022-12-20 MED ORDER — SODIUM CHLORIDE 0.9 % IV SOLN
250.0000 mL | INTRAVENOUS | Status: DC
  Administered 2022-12-20: 250 mL via INTRAVENOUS

## 2022-12-20 MED ORDER — GLYCOPYRROLATE PF 0.2 MG/ML IJ SOSY
PREFILLED_SYRINGE | INTRAMUSCULAR | Status: AC
  Filled 2022-12-20: qty 1

## 2022-12-20 MED ORDER — ACETAMINOPHEN 10 MG/ML IV SOLN
1000.0000 mg | Freq: Once | INTRAVENOUS | Status: DC | PRN
  Administered 2022-12-20: 1000 mg via INTRAVENOUS

## 2022-12-20 MED ORDER — FENTANYL CITRATE (PF) 100 MCG/2ML IJ SOLN
25.0000 ug | INTRAMUSCULAR | Status: DC | PRN
  Administered 2022-12-20: 25 ug via INTRAVENOUS
  Administered 2022-12-20 (×2): 50 ug via INTRAVENOUS

## 2022-12-20 MED ORDER — ONDANSETRON HCL 4 MG/2ML IJ SOLN
INTRAMUSCULAR | Status: AC
  Filled 2022-12-20: qty 2

## 2022-12-20 MED ORDER — DEXAMETHASONE SODIUM PHOSPHATE 10 MG/ML IJ SOLN
INTRAMUSCULAR | Status: AC
  Filled 2022-12-20: qty 1

## 2022-12-20 MED ORDER — ROCURONIUM BROMIDE 10 MG/ML (PF) SYRINGE
PREFILLED_SYRINGE | INTRAVENOUS | Status: AC
  Filled 2022-12-20: qty 10

## 2022-12-20 MED ORDER — ROCURONIUM BROMIDE 10 MG/ML (PF) SYRINGE
PREFILLED_SYRINGE | INTRAVENOUS | Status: DC | PRN
  Administered 2022-12-20 (×2): 10 mg via INTRAVENOUS
  Administered 2022-12-20: 30 mg via INTRAVENOUS
  Administered 2022-12-20: 70 mg via INTRAVENOUS
  Administered 2022-12-20: 10 mg via INTRAVENOUS

## 2022-12-20 MED ORDER — OXYCODONE-ACETAMINOPHEN 5-325 MG PO TABS
1.0000 | ORAL_TABLET | ORAL | Status: DC | PRN
  Administered 2022-12-21: 1 via ORAL
  Filled 2022-12-20 (×2): qty 1

## 2022-12-20 MED ORDER — DEXMEDETOMIDINE HCL IN NACL 80 MCG/20ML IV SOLN
INTRAVENOUS | Status: DC | PRN
  Administered 2022-12-20: 8 ug via BUCCAL

## 2022-12-20 MED ORDER — AMLODIPINE BESYLATE 5 MG PO TABS
5.0000 mg | ORAL_TABLET | Freq: Every day | ORAL | Status: DC
  Administered 2022-12-20 – 2022-12-21 (×2): 5 mg via ORAL
  Filled 2022-12-20 (×2): qty 1

## 2022-12-20 MED ORDER — FENTANYL CITRATE (PF) 100 MCG/2ML IJ SOLN
INTRAMUSCULAR | Status: AC
  Filled 2022-12-20: qty 2

## 2022-12-20 MED ORDER — CEFAZOLIN SODIUM 1 G IJ SOLR
INTRAMUSCULAR | Status: AC
  Filled 2022-12-20: qty 20

## 2022-12-20 MED ORDER — LIDOCAINE-EPINEPHRINE 1 %-1:100000 IJ SOLN
INTRAMUSCULAR | Status: AC
  Filled 2022-12-20: qty 1

## 2022-12-20 MED ORDER — PHENYLEPHRINE HCL-NACL 20-0.9 MG/250ML-% IV SOLN
INTRAVENOUS | Status: DC | PRN
  Administered 2022-12-20: 25 ug/min via INTRAVENOUS

## 2022-12-20 MED ORDER — BUPIVACAINE HCL (PF) 0.5 % IJ SOLN
INTRAMUSCULAR | Status: DC | PRN
  Administered 2022-12-20: 3 mL

## 2022-12-20 MED ORDER — METHOCARBAMOL 500 MG PO TABS
500.0000 mg | ORAL_TABLET | Freq: Four times a day (QID) | ORAL | Status: DC | PRN
  Filled 2022-12-20: qty 1

## 2022-12-20 MED ORDER — CHLORHEXIDINE GLUCONATE CLOTH 2 % EX PADS: 6.0000 | MEDICATED_PAD | Freq: Once | CUTANEOUS | Status: DC

## 2022-12-20 MED ORDER — ONDANSETRON HCL 4 MG/2ML IJ SOLN: 4.0000 mg | Freq: Four times a day (QID) | INTRAMUSCULAR | Status: DC | PRN

## 2022-12-20 MED ORDER — FENTANYL CITRATE (PF) 250 MCG/5ML IJ SOLN
INTRAMUSCULAR | Status: AC
  Filled 2022-12-20: qty 5

## 2022-12-20 MED ORDER — BISACODYL 10 MG RE SUPP: 10.0000 mg | Freq: Every day | RECTAL | Status: DC | PRN

## 2022-12-20 MED ORDER — POLYETHYLENE GLYCOL 3350 17 G PO PACK: 17.0000 g | PACK | Freq: Every day | ORAL | Status: DC | PRN

## 2022-12-20 MED ORDER — ONDANSETRON HCL 4 MG PO TABS: 4.0000 mg | ORAL_TABLET | Freq: Four times a day (QID) | ORAL | Status: DC | PRN

## 2022-12-20 MED ORDER — ACETAMINOPHEN 325 MG PO TABS
650.0000 mg | ORAL_TABLET | ORAL | Status: DC | PRN
  Administered 2022-12-20 – 2022-12-21 (×2): 650 mg via ORAL
  Filled 2022-12-20 (×4): qty 2

## 2022-12-20 MED ORDER — MIDAZOLAM HCL 2 MG/2ML IJ SOLN
INTRAMUSCULAR | Status: DC | PRN
  Administered 2022-12-20: 2 mg via INTRAVENOUS

## 2022-12-20 MED ORDER — THROMBIN 5000 UNITS EX SOLR
OROMUCOSAL | Status: DC | PRN
  Administered 2022-12-20: 5 mL via TOPICAL

## 2022-12-20 MED ORDER — CARVEDILOL 3.125 MG PO TABS
3.1250 mg | ORAL_TABLET | Freq: Two times a day (BID) | ORAL | Status: DC
  Administered 2022-12-20 – 2022-12-21 (×2): 3.125 mg via ORAL
  Filled 2022-12-20 (×2): qty 1

## 2022-12-20 MED ORDER — LORATADINE 10 MG PO TABS
10.0000 mg | ORAL_TABLET | Freq: Every day | ORAL | Status: DC
  Administered 2022-12-20 – 2022-12-21 (×2): 10 mg via ORAL
  Filled 2022-12-20 (×2): qty 1

## 2022-12-20 MED ORDER — NITROGLYCERIN 0.4 MG SL SUBL: 0.4000 mg | SUBLINGUAL_TABLET | SUBLINGUAL | Status: DC | PRN

## 2022-12-20 MED ORDER — SUGAMMADEX SODIUM 200 MG/2ML IV SOLN
INTRAVENOUS | Status: DC | PRN
  Administered 2022-12-20: 300 mg via INTRAVENOUS

## 2022-12-20 MED ORDER — SENNA 8.6 MG PO TABS
1.0000 | ORAL_TABLET | Freq: Two times a day (BID) | ORAL | Status: DC
  Administered 2022-12-20 – 2022-12-21 (×2): 8.6 mg via ORAL
  Filled 2022-12-20 (×2): qty 1

## 2022-12-20 MED ORDER — ATORVASTATIN CALCIUM 10 MG PO TABS
20.0000 mg | ORAL_TABLET | Freq: Every day | ORAL | Status: DC
  Administered 2022-12-20 – 2022-12-21 (×2): 20 mg via ORAL
  Filled 2022-12-20 (×2): qty 2

## 2022-12-20 MED ORDER — PANTOPRAZOLE SODIUM 40 MG PO TBEC
40.0000 mg | DELAYED_RELEASE_TABLET | Freq: Every day | ORAL | Status: DC
  Administered 2022-12-21: 40 mg via ORAL
  Filled 2022-12-20: qty 1

## 2022-12-20 MED ORDER — DOCUSATE SODIUM 100 MG PO CAPS
100.0000 mg | ORAL_CAPSULE | Freq: Two times a day (BID) | ORAL | Status: DC
  Administered 2022-12-20 – 2022-12-21 (×2): 100 mg via ORAL
  Filled 2022-12-20 (×2): qty 1

## 2022-12-20 MED ORDER — OSELTAMIVIR PHOSPHATE 75 MG PO CAPS: 75.0000 mg | ORAL_CAPSULE | Freq: Every day | ORAL | Status: DC

## 2022-12-20 SURGICAL SUPPLY — 51 items
ADH SKN CLS APL DERMABOND .7 (GAUZE/BANDAGES/DRESSINGS) ×1
BAG COUNTER SPONGE SURGICOUNT (BAG) ×1 IMPLANT
BAG SPNG CNTER NS LX DISP (BAG) ×2
BAND INSRT 18 STRL LF DISP RB (MISCELLANEOUS)
BAND RUBBER #18 3X1/16 STRL (MISCELLANEOUS) IMPLANT
BIT DRILL NEURO 2X3.1 SFT TUCH (MISCELLANEOUS) ×1 IMPLANT
BNDG GAUZE DERMACEA FLUFF 4 (GAUZE/BANDAGES/DRESSINGS) IMPLANT
BNDG GZE DERMACEA 4 6PLY (GAUZE/BANDAGES/DRESSINGS)
BUR BARREL STRAIGHT FLUTE 4.0 (BURR) IMPLANT
CAGE CERV MOD 5X17X14 7D (Cage) IMPLANT
CAGE CERV MOD 6X17X14 7D (Cage) IMPLANT
CANISTER SUCT 3000ML PPV (MISCELLANEOUS) ×1 IMPLANT
DERMABOND ADVANCED .7 DNX12 (GAUZE/BANDAGES/DRESSINGS) ×1 IMPLANT
DRAPE LAPAROTOMY 100X72 PEDS (DRAPES) ×1 IMPLANT
DRAPE MICROSCOPE SLANT 54X150 (MISCELLANEOUS) IMPLANT
DRILL NEURO 2X3.1 SOFT TOUCH (MISCELLANEOUS) ×1
DURAPREP 6ML APPLICATOR 50/CS (WOUND CARE) ×1 IMPLANT
ELECT COATED BLADE 2.86 ST (ELECTRODE) ×1 IMPLANT
ELECT REM PT RETURN 9FT ADLT (ELECTROSURGICAL) ×1
ELECTRODE REM PT RTRN 9FT ADLT (ELECTROSURGICAL) ×1 IMPLANT
GAUZE 4X4 16PLY ~~LOC~~+RFID DBL (SPONGE) IMPLANT
GLOVE BIOGEL PI IND STRL 8.5 (GLOVE) ×1 IMPLANT
GLOVE ECLIPSE 8.5 STRL (GLOVE) ×1 IMPLANT
GLOVE EXAM NITRILE XL STR (GLOVE) IMPLANT
GOWN STRL REUS W/ TWL LRG LVL3 (GOWN DISPOSABLE) IMPLANT
GOWN STRL REUS W/ TWL XL LVL3 (GOWN DISPOSABLE) ×1 IMPLANT
GOWN STRL REUS W/TWL 2XL LVL3 (GOWN DISPOSABLE) ×1 IMPLANT
GOWN STRL REUS W/TWL LRG LVL3 (GOWN DISPOSABLE)
GOWN STRL REUS W/TWL XL LVL3 (GOWN DISPOSABLE) ×2
HALTER HD/CHIN CERV TRACTION D (MISCELLANEOUS) ×1 IMPLANT
HEMOSTAT POWDER KIT SURGIFOAM (HEMOSTASIS) ×1 IMPLANT
KIT BASIN OR (CUSTOM PROCEDURE TRAY) ×1 IMPLANT
KIT TURNOVER KIT B (KITS) ×1 IMPLANT
NDL SPNL 22GX3.5 QUINCKE BK (NEEDLE) ×1 IMPLANT
NEEDLE HYPO 22GX1.5 SAFETY (NEEDLE) ×1 IMPLANT
NEEDLE SPNL 22GX3.5 QUINCKE BK (NEEDLE) ×1 IMPLANT
NS IRRIG 1000ML POUR BTL (IV SOLUTION) ×1 IMPLANT
PACK LAMINECTOMY NEURO (CUSTOM PROCEDURE TRAY) ×1 IMPLANT
PAD ARMBOARD 7.5X6 YLW CONV (MISCELLANEOUS) ×3 IMPLANT
PATTIES SURGICAL .5 X1 (DISPOSABLE) ×1 IMPLANT
PIN DISTRACTION 14MM (PIN) IMPLANT
PLATE ACP 1.9X54 3LVL (Plate) IMPLANT
PUTTY DBM PROPEL MEDIUM (Putty) IMPLANT
SCREW ACP VA ST 3.5X15 (Screw) IMPLANT
SET WALTER ACTIVATION W/DRAPE (SET/KITS/TRAYS/PACK) ×1 IMPLANT
SPIKE FLUID TRANSFER (MISCELLANEOUS) ×1 IMPLANT
SPONGE INTESTINAL PEANUT (DISPOSABLE) ×1 IMPLANT
SUT VIC AB 4-0 RB1 18 (SUTURE) ×2 IMPLANT
TOWEL GREEN STERILE (TOWEL DISPOSABLE) ×1 IMPLANT
TOWEL GREEN STERILE FF (TOWEL DISPOSABLE) ×1 IMPLANT
WATER STERILE IRR 1000ML POUR (IV SOLUTION) ×1 IMPLANT

## 2022-12-20 NOTE — Anesthesia Postprocedure Evaluation (Signed)
Anesthesia Post Note  Patient: Terri Rodriguez  Procedure(s) Performed: Cervical four-five Cervical five-six Cervical six-seven Anterior Cervical Decompression Fusion     Patient location during evaluation: PACU Anesthesia Type: General Level of consciousness: awake and alert Pain management: pain level controlled Vital Signs Assessment: post-procedure vital signs reviewed and stable Respiratory status: spontaneous breathing, nonlabored ventilation, respiratory function stable and patient connected to nasal cannula oxygen Cardiovascular status: blood pressure returned to baseline and stable Postop Assessment: no apparent nausea or vomiting Anesthetic complications: no  There were no known notable events for this encounter.  Last Vitals:  Vitals:   12/20/22 1345 12/20/22 1410  BP: (!) 132/58 134/68  Pulse: 80 82  Resp: 15 19  Temp: 36.8 C 36.7 C  SpO2: 94% 97%    Last Pain:  Vitals:   12/20/22 1410  TempSrc: Oral  PainSc: 3                  Tiajuana Amass

## 2022-12-20 NOTE — Op Note (Signed)
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 Postoperative diagnosis: Same Procedure: Anterior cervical decompression C4-5 C5-6 and C6-7 arthrodesis with structural titanium implant allograft and anterior fixation with cervical plate C4-C7. Surgeon: Kristeen Miss First Assistant: Duffy Rhody, MD Anesthesia: General endotracheal Indications: Terri Rodriguez is a 77 year old individual has had significant neck shoulder and arm pain plus deterioration of gait in addition to back pain and leg pain she has known spondylitic disease at the L1-L2 level however it was noted that she had cord compression at the C6-C7 level with high-grade stenosis also noted at C4-5 and C5-6.  She was advised regarding the need for cervical decompression.  Procedure: The patient was brought to the operating room supine on the stretcher.  After the smooth induction of general endotracheal anesthesia, she was carefully placed and 5 pounds of halter traction neck was prepped with alcohol DuraPrep and draped in a sterile fashion.  A transverse incision was made in the left-sided neck and carried down through the platysma.  Plane between the sternocleidomastoid and strap muscles dissected bluntly into the prevertebral space was reached.  First identifiable disc space was noted to be that of C5-C6.  Longus coli muscle was stripped off via the side of midline and large ventral osteophytes were also removed.  The disc base at C5-C6 was entered and was noted to be severely degenerated and desiccated with large hard bony osteophytic overgrowth.  The disc space was drilled out to the region of the posterior longitudinal ligament and the ligament was gradually opened from side-to-side decompressing the central canal and then into the lateral recesses doing a full decompression out into the foramen.  Once this was accomplished the interspace was sized for appropriate  sized spacer and was felt that a 6 mm tall 17 x 14 mm spacer with 7 degrees of lordosis would fit best this was fitted with allograft and allograft was packed around the spacer once it was placed.  Attention was then turned to C6-7 where similar process was carried out and a good decompression was also obtained here a 5 mm tall by 17 x 14 mm spacer with 7 degrees of lordosis was chosen for this site.  C4-5 was then decompressed in a similar fashion large ventral osteophytes were also encountered here and these were removed to smooth the ventral aspects of the vertebral bodies.  A 5 x 17 x 14 mm spacer with 7 degrees lordosis was used here filled with allograft.  Then a 54 mm standard size ACP plate was fitted to the ventral aspect of the vertebral bodies and held in place with 8 variable-angle 3.5 x 15 mm screws.  Final radiograph was obtained identifying good fixation of the vertebral bodies.  Then the wound was inspected for hemostasis the retractors were removed and once verified the platysma was closed with 4-0 Vicryl in interrupted fashion and the subcuticular tissue was closed with 4-0 Vicryl also.  Dermabond was placed on the skin.  Blood loss was estimated at 100 cc.  During this procedure Dr. Marcello Moores helped with retraction and decompressing the contralateral aspect of the vertebrae while I work from the left side.

## 2022-12-20 NOTE — Transfer of Care (Signed)
Immediate Anesthesia Transfer of Care Note  Patient: Terri Rodriguez  Procedure(s) Performed: Cervical four-five Cervical five-six Cervical six-seven Anterior Cervical Decompression Fusion  Patient Location: PACU  Anesthesia Type:General  Level of Consciousness: drowsy  Airway & Oxygen Therapy: Patient Spontanous Breathing and Patient connected to face mask oxygen  Post-op Assessment: Report given to RN, Post -op Vital signs reviewed and stable, and Patient moving all extremities  Post vital signs: Reviewed and stable  Last Vitals:  Vitals Value Taken Time  BP 149/64 12/20/22 1145  Temp 36.8 C 12/20/22 1145  Pulse 88 12/20/22 1148  Resp 21 12/20/22 1148  SpO2 97 % 12/20/22 1148  Vitals shown include unvalidated device data.  Last Pain:  Vitals:   12/20/22 0622  TempSrc:   PainSc: 0-No pain      Patients Stated Pain Goal: 3 (49/44/96 7591)  Complications: There were no known notable events for this encounter.

## 2022-12-20 NOTE — Anesthesia Preprocedure Evaluation (Addendum)
Anesthesia Evaluation  Patient identified by MRN, date of birth, ID band Patient awake    Reviewed: Allergy & Precautions, NPO status , Patient's Chart, lab work & pertinent test results  Airway Mallampati: II  TM Distance: >3 FB Neck ROM: Full    Dental no notable dental hx.    Pulmonary sleep apnea and Continuous Positive Airway Pressure Ventilation    Pulmonary exam normal        Cardiovascular hypertension, Pt. on medications and Pt. on home beta blockers + angina  + CAD   Rhythm:Regular Rate:Normal     Neuro/Psych negative neurological ROS  negative psych ROS   GI/Hepatic Neg liver ROS, hiatal hernia,GERD  Medicated,,  Endo/Other  negative endocrine ROS    Renal/GU negative Renal ROS  negative genitourinary   Musculoskeletal  (+) Arthritis , Osteoarthritis,  Cervical stenosis    Abdominal Normal abdominal exam  (+)   Peds  Hematology  (+) Blood dyscrasia, anemia   Anesthesia Other Findings   Reproductive/Obstetrics                             Anesthesia Physical Anesthesia Plan  ASA: 3  Anesthesia Plan: General   Post-op Pain Management:    Induction: Intravenous  PONV Risk Score and Plan: 3 and Ondansetron, Dexamethasone and Treatment may vary due to age or medical condition  Airway Management Planned: Mask, Oral ETT and Video Laryngoscope Planned  Additional Equipment: None  Intra-op Plan:   Post-operative Plan: Extubation in OR  Informed Consent: I have reviewed the patients History and Physical, chart, labs and discussed the procedure including the risks, benefits and alternatives for the proposed anesthesia with the patient or authorized representative who has indicated his/her understanding and acceptance.     Dental advisory given  Plan Discussed with: CRNA  Anesthesia Plan Comments: (Lab Results      Component                Value               Date                       WBC                      6.0                 12/20/2022                HGB                      12.7                12/20/2022                HCT                      37.1                12/20/2022                MCV                      95.9                12/20/2022  PLT                      222                 12/20/2022             Lab Results      Component                Value               Date                      NA                       138                 12/20/2022                K                        4.3                 12/20/2022                CO2                      23                  12/20/2022                GLUCOSE                  110 (H)             12/20/2022                BUN                      25 (H)              12/20/2022                CREATININE               0.66                12/20/2022                CALCIUM                  9.2                 12/20/2022                GFRNONAA                 >60                 12/20/2022           )       Anesthesia Quick Evaluation

## 2022-12-20 NOTE — Progress Notes (Signed)
Patient's IV site in Left hand noted to be swollen. Extremity was then elevated and ice was applied. This nurse stated that she has another ABT due at 0400 but pt is hesitant/refused to be stuck again because she stated she have been stuck couple of times. Pt asked if there is a PO/ABT that she can take instead. Will endorse and ask MD in AM. Will continue to close monitor pt.

## 2022-12-20 NOTE — H&P (Signed)
Terri Rodriguez is an 77 y.o. female.   Chief Complaint: Numbness in the hands shoulder discomfort leg pain while walking HPI: Patient is a 77 year old individual who has some significant spondylosis in the lumbar spine with adjacent level disease.  On her workup it was noted that she had advanced cervical spondylitic disease with cord compression at C3 6 7 being the worst C4-5 and C5-6 also showing flattening of the cord.  She was advised regarding the need for surgical decompression from C4-C7 and is now admitted for this process.  Past Medical History:  Diagnosis Date   ALLERGIC RHINITIS    Allergy    Anginal pain ([place])    Blood transfusion without reported diagnosis    Cataract    small right   Cholelithiasis 06/09/2008   gallstones 2009 on ct scan   Complication of anesthesia 2012   went to ICU after bilateral knees , unstable vital signs oct 2012, has surgery since did ok   Coronary artery disease    30 % narrowing lad   Diverticulitis    EPICONDYLITIS, LATERAL    osteoarthritis, previous joint replacements   Fall 10/2018   GERD    Hepatic hemangioma 06/09/2008   Hiatal hernia 03/2000   Hx of adenomatous polyp of colon 06/05/2008   Hx of cardiac catheterization 2008   clean coronarys DrKelly    Hx of chest pain 2003   neg cath remot hx of narrowwing lad in 2003 nl 2008, none in many years   HYPERLIPIDEMIA    IBS (irritable bowel syndrome)    LEUKOPENIA, CHRONIC    Mononucleosis 1963   RAYNAUD'S SYNDROME, HX OF    improved after cardiac meds initiated   Rosacea    facial   Sleep apnea    SLEEP APNEA, OBSTRUCTIVE    cpap, settings "automatic" settings 11-12   Subacute thyroiditis    UNSPECIFIED ANEMIA     Past Surgical History:  Procedure Laterality Date   ABDOMINAL HYSTERECTOMY  1993   bso   BACK SURGERY  2008   l 3 to l4 l4 to l5   BARTHOLIN GLAND CYST EXCISION Right 09/09/2019   Procedure: EXCISION OF VULVAR MASS TIMES TWO;  Surgeon: Megan Salon, MD;   Location: [facility];  Service: Gynecology;  Laterality: Right;   CARDIAC CATHETERIZATION  04/07/2007   Noncritical coronary artery disease. Contiue medical therapy.   CARDIAC CATHETERIZATION  12/03/2007   Normal LV function. Mild angiographic mitral valve prolapse. Normal coronary arteries.   CARDIOVASCULAR STRESS TEST  11/09/2007   Moderate ischemia in Mid Anterior, Mid Anteroseptal, Apical Anterior, and Apical Septal regions. EKG negative for ischemia.   [place]   COLON RESECTION Left 01/03/2022   Procedure: COLON RESECTION;  Surgeon: Felicie Morn, MD;  Location: [facility];  Service: General;  Laterality: Left;   COLOSTOMY  01/03/2022   Procedure: COLOSTOMY;  Surgeon: Felicie Morn, MD;  Location: [facility];  Service: General;;   dental implants  11/2002,11/2011,11/2012   dental implants     x 6   [place]     d and e 1988 and [place]  09/21/2011   bilateral knee   KNEE ARTHROSCOPY     bilateral   LAPAROTOMY N/A 01/03/2022   Procedure: EXPLORATORY LAPAROTOMY;  Surgeon: Felicie Morn, MD;  Location: [facility];  Service: General;  Laterality: N/A;   lumbar  surgery fixation with disc replacement  10/08   Left   NASAL SEPTOPLASTY W/ TURBINOPLASTY  05/2000   NOCTURNAL POLYSOMNOGRAM  12/31/2006   Severe obstructive sleep apnea. AHI-87/hr   REPLACEMENT TOTAL KNEE  2012   bilateral   TONSILLECTOMY  1952   adenoids too   TOTAL HIP ARTHROPLASTY Left 10/21/2013   Procedure: LEFT TOTAL HIP ARTHROPLASTY ANTERIOR APPROACH;  Surgeon: [name], MD;  Location: [facility];  Service: Orthopedics;  Laterality: Left;   TOTAL HIP ARTHROPLASTY Right 07/16/2014   Procedure: RIGHT TOTAL HIP ARTHROPLASTY ANTERIOR APPROACH;  Surgeon: [name], MD;  Location: [facility];  Service: Orthopedics;  Laterality: Right;   TRANSTHORACIC ECHOCARDIOGRAM  11/09/2007   EF 16%, LV systolic function normal. Mild  aortic root dilation.   WRIST SURGERY Left 04/03/14   ORIF "distal radial head fracture"   XI ROBOTIC ASSISTED COLOSTOMY TAKEDOWN N/A 09/26/2022   Procedure: ROBOTIC OSTOMY REVERSAL;  Surgeon: Felicie Morn, MD;  Location: [facility];  Service: General;  Laterality: N/A;    Family History  Problem Relation Age of Onset   Rheum arthritis Mother    Diabetes Mother    Heart failure Mother    Thrombocytopenia Mother    Sleep apnea Mother    Ulcers Mother        PUD   Deep vein thrombosis Father    Pulmonary embolism Father    Osteoarthritis Father    Atrial fibrillation Father    Dementia Father    Sleep apnea Father    Sleep apnea Brother    Obesity Brother    Sleep apnea Brother    Obesity Brother    Atrial fibrillation Brother    Colon cancer Maternal Aunt 59   Colon cancer Maternal Aunt 90   Congenital adrenal hyperplasia Grandchild    Pancreatic cancer Other    Uterine cancer Other    Leukemia Other    Inflammatory bowel disease Other        aunt   Esophageal cancer Neg Hx    Rectal cancer Neg Hx    Stomach cancer Neg Hx    Colon polyps Neg Hx    Social History:  reports that she has never smoked. She has never used smokeless tobacco. She reports current alcohol use of about 7.0 standard drinks of alcohol per week. She reports that she does not use drugs.  Allergies:  Allergies  Allergen Reactions   Codeine Nausea Only   Crab [Shellfish Allergy] Itching and Nausea And Vomiting    Palm and sole of feet itch- stone crab  Per pt- betadine ok    Medications Prior to Admission  Medication Sig Dispense Refill   acetaminophen (TYLENOL) 500 MG tablet Take 1 tablet (500 mg total) by mouth every 6 (six) hours as needed for fever or mild pain. (Patient taking differently: Take 500 mg by mouth every morning.) 30 tablet 0   amLODipine (NORVASC) 5 MG tablet TAKE 1 TABLET BY MOUTH EVERY DAY (Patient taking differently: Take 5 mg by mouth every evening.) 90 tablet 3    aspirin EC 81 MG tablet Take 81 mg by mouth daily.     atorvastatin (LIPITOR) 20 MG tablet TAKE 1 TABLET BY MOUTH EVERY DAY AT 6 PM 90 tablet 2   calcium elemental as carbonate (TUMS ULTRA 1000) 400 MG chewable tablet Chew 1,000 mg by mouth daily as needed for heartburn.     carvedilol (COREG) 3.125 MG tablet TAKE 1 TABLET BY MOUTH 2  TIMES DAILY WITH A meal 180 tablet 3   celecoxib (CELEBREX) 100 MG capsule TAKE 1 CAPSULE BY MOUTH 2 TIMES DAILY (Patient taking differently: Take 200 mg by mouth every morning.) 60 capsule 3   DULoxetine (CYMBALTA) 60 MG capsule TAKE 1 CAPSULE BY MOUTH EVERY DAY (Patient taking differently: Take 60 mg by mouth every evening.) 90 capsule 1   estradiol (ESTRACE) 0.5 MG tablet 1/2 tab daily (Patient taking differently: Take 0.25 mg by mouth every other day.) 45 tablet 4   fexofenadine (ALLEGRA) 180 MG tablet Take 180 mg by mouth daily with supper.     metroNIDAZOLE (METROGEL) 1 % gel Apply 1 Application topically daily as needed (rosacea).     Multiple Vitamin (MULTIVITAMIN WITH MINERALS) TABS tablet Take 1 tablet by mouth daily. Centrum Women's no Iron     nitroGLYCERIN (NITROSTAT) 0.4 MG SL tablet PLACE 1 TABLET UNDER THE TONGUE EVERY 5 MINUTES AS NEEDED FOR CHEST PAIN (Patient taking differently: 0.4 mg every 5 (five) minutes as needed for chest pain.) 25 tablet 3   omeprazole (PRILOSEC) 20 MG capsule Take 40 mg by mouth every morning.     oseltamivir (TAMIFLU) 75 MG capsule Take 1 capsule (75 mg total) by mouth daily. 10 capsule 0   psyllium (REGULOID) 0.52 g capsule Take 0.52 g by mouth 2 (two) times daily as needed (bowel).     ramipril (ALTACE) 10 MG capsule TAKE 1 CAPSULE BY MOUTH EVERY DAY (Patient taking differently: Take 10 mg by mouth every evening.) 90 capsule 3   amoxicillin (AMOXIL) 500 MG capsule Take 2,000 mg by mouth See admin instructions. Take 2000 mg by mouth 1 hour prior to dental procedures.     Azelaic Acid 15 % cream Apply 1 application topically 2  (two) times daily as needed (rosacea). After skin is thoroughly washed and patted dry, gently but thoroughly massage a thin film of azelaic acid cream into the affected area t daily,after shower.     clobetasol (TEMOVATE) 0.05 % external solution Apply 1 application topically daily as needed (itching).     oxyCODONE-acetaminophen (PERCOCET) 5-325 MG tablet Take 1 tablet by mouth every 4 (four) hours as needed for severe pain. (Patient not taking: Reported on 12/07/2022) 20 tablet 0   valACYclovir (VALTREX) 1000 MG tablet TAKE 2 TABLET BY MOUTH Q12 hours X 2 DOSES WITH FEVER BLISTERS 30 tablet 1    Results for orders placed or performed during the hospital encounter of 12/20/22 (from the past 48 hour(s))  Type and screen     Status: None   Collection Time: 12/20/22  6:01 AM  Result Value Ref Range   ABO/RH(D) O NEG    Antibody Screen NEG    Sample Expiration      12/23/2022,2359 Performed at [facility], [place] [address], [place], [place] [address]   Basic metabolic panel per protocol     Status: Abnormal   Collection Time: 12/20/22  6:08 AM  Result Value Ref Range   Sodium 138 135 - 145 mmol/L   Potassium 4.3 3.5 - 5.1 mmol/L   Chloride 106 98 - 111 mmol/L   CO2 23 22 - 32 mmol/L   Glucose, Bld 110 (H) 70 - 99 mg/dL    Comment: Glucose reference range applies only to samples taken after fasting for at least 8 hours.   BUN 25 (H) 8 - 23 mg/dL   Creatinine, Ser 0.66 0.44 - 1.00 mg/dL   Calcium 9.2 8.9 - 10.3  mg/dL   GFR, Estimated >60 >60 mL/min    Comment: (NOTE) Calculated using the CKD-EPI Creatinine Equation (2021)    Anion gap 9 5 - 15    Comment: Performed at [facility], [place] [address] [place], [place] [address]  CBC per protocol     Status: None   Collection Time: 12/20/22  6:08 AM  Result Value Ref Range   WBC 6.0 4.0 - 10.5 K/uL   RBC 3.87 3.87 - 5.11 MIL/uL   Hemoglobin 12.7 12.0 - 15.0 g/dL   HCT 37.1 36.0 - 46.0 %   MCV 95.9 80.0 - 100.0 fL   MCH 32.8  26.0 - 34.0 pg   MCHC 34.2 30.0 - 36.0 g/dL   RDW 14.6 11.5 - 15.5 %   Platelets 222 150 - 400 K/uL   nRBC 0.0 0.0 - 0.2 %    Comment: Performed at [facility], [place] [address] [place], [place] [address]   No results found.  Review of Systems  Musculoskeletal:  Positive for back pain, gait problem, neck pain and neck stiffness.  Neurological:  Positive for weakness and numbness.  All other systems reviewed and are negative.   Blood pressure 137/74, pulse 69, temperature 98.5 F (36.9 C), temperature source Oral, resp. rate 17, height 5' 4.5" (1.638 m), weight 106.6 kg, SpO2 97 %. Physical Exam Constitutional:      Appearance: Normal appearance. She is obese.  HENT:     Head: Normocephalic and atraumatic.     Right Ear: Tympanic membrane, ear canal and external ear normal.     Left Ear: Tympanic membrane, ear canal and external ear normal.     Nose: Nose normal.     Mouth/Throat:     Mouth: Mucous membranes are moist.     Pharynx: Oropharynx is clear.  Eyes:     Extraocular Movements: Extraocular movements intact.     Conjunctiva/sclera: Conjunctivae normal.     Pupils: Pupils are equal, round, and reactive to light.  Neck:     Comments: Mended range of motion turning 50% flexion extension 50% positive Spurling maneuver Cardiovascular:     Rate and Rhythm: Normal rate and regular rhythm.  Pulmonary:     Effort: Pulmonary effort is normal.     Breath sounds: Normal breath sounds.  Abdominal:     General: Abdomen is flat.     Palpations: Abdomen is soft.  Skin:    Capillary Refill: Capillary refill takes less than 2 seconds.  Neurological:     General: No focal deficit present.     Mental Status: She is alert.  Psychiatric:        Mood and Affect: Mood normal.        Behavior: Behavior normal.        Thought Content: Thought content normal.        Judgment: Judgment normal.      Assessment/Plan Cervical spondylosis with myelopathy and radiculopathy C4-5  C5-6 C6-C7.  Plan: Decompression fusion C4-5 C5-6 C6-C7.  Earleen Newport, MD 12/20/2022, 7:29 AM

## 2022-12-20 NOTE — Anesthesia Procedure Notes (Signed)
Procedure Name: Intubation Date/Time: 12/20/2022 7:51 AM  Performed by: Ester Rink, CRNAPre-anesthesia Checklist: Patient identified, Emergency Drugs available, Suction available and Patient being monitored Patient Re-evaluated:Patient Re-evaluated prior to induction Oxygen Delivery Method: Circle system utilized Preoxygenation: Pre-oxygenation with 100% oxygen Induction Type: IV induction Ventilation: Mask ventilation without difficulty Laryngoscope Size: Glidescope and 4 Grade View: Grade II Tube type: Oral Tube size: 7.0 mm Number of attempts: 1 Airway Equipment and Method: Stylet and Oral airway Placement Confirmation: ETT inserted through vocal cords under direct vision, positive ETCO2 and breath sounds checked- equal and bilateral Secured at: 22 cm Tube secured with: Tape Dental Injury: Teeth and Oropharynx as per pre-operative assessment

## 2022-12-21 ENCOUNTER — Encounter (HOSPITAL_COMMUNITY): Payer: Self-pay | Admitting: Neurological Surgery

## 2022-12-21 MED ORDER — OXYCODONE-ACETAMINOPHEN 5-325 MG PO TABS: 1.0000 | ORAL_TABLET | ORAL | 0 refills | Status: DC | PRN

## 2022-12-21 MED ORDER — METHOCARBAMOL 500 MG PO TABS: 500.0000 mg | ORAL_TABLET | Freq: Four times a day (QID) | ORAL | 1 refills | Status: DC | PRN

## 2022-12-21 NOTE — Plan of Care (Signed)
  Problem: Education: Goal: Ability to verbalize activity precautions or restrictions will improve Outcome: Completed/Met Goal: Knowledge of the prescribed therapeutic regimen will improve Outcome: Completed/Met Goal: Understanding of discharge needs will improve Outcome: Completed/Met   Problem: Activity: Goal: Ability to avoid complications of mobility impairment will improve Outcome: Completed/Met Goal: Ability to tolerate increased activity will improve Outcome: Completed/Met Goal: Will remain free from falls Outcome: Completed/Met   Problem: Bowel/Gastric: Goal: Gastrointestinal status for postoperative course will improve Outcome: Completed/Met   Problem: Clinical Measurements: Goal: Ability to maintain clinical measurements within normal limits will improve Outcome: Completed/Met Goal: Postoperative complications will be avoided or minimized Outcome: Completed/Met Goal: Diagnostic test results will improve Outcome: Completed/Met   Problem: Pain Management: Goal: Pain level will decrease Outcome: Completed/Met   Problem: Skin Integrity: Goal: Will show signs of wound healing Outcome: Completed/Met   Problem: Health Behavior/Discharge Planning: Goal: Identification of resources available to assist in meeting health care needs will improve Outcome: Completed/Met   Problem: Bladder/Genitourinary: Goal: Urinary functional status for postoperative course will improve Outcome: Completed/Met Patient alert and oriented, void, ambulate. Surgical site clean and dry. D/c instructions explain and given to the patient, all questions answered.

## 2022-12-21 NOTE — Evaluation (Signed)
Physical Therapy Evaluation  Patient Details Name: Terri Rodriguez MRN: [id number] DOB: 05/11/1946 Today's Date: 12/21/2022  History of Present Illness  Pt is a 77 y/o female presenting on 1/16 for same day C4-5, C5-6, C6-7 ACDF. PMH includes: cataract, cholelithiasis, CAD, hiatal hernia, IBS, sleep apnea, prior back surgery, B TKA, B THA.   Clinical Impression  Pt admitted with above diagnosis. At the time of PT eval, pt was able to demonstrate transfers and ambulation with gross min guard assist to supervision for safety and RW for support. Pt highly distractible throughout session and requires cues to attend to task and precautions. Pt was educated on precautions, brace application/wearing schedule, appropriate activity progression, and car transfer. Pt currently with functional limitations due to the deficits listed below (see PT Problem List). Pt will benefit from skilled PT to increase their independence and safety with mobility to allow discharge to the venue listed below.         Recommendations for follow up therapy are one component of a multi-disciplinary discharge planning process, led by the attending physician.  Recommendations may be updated based on patient status, additional functional criteria and insurance authorization.  Follow Up Recommendations No PT follow up      Assistance Recommended at Discharge PRN  Patient can return home with the following  A little help with walking and/or transfers;A little help with bathing/dressing/bathroom;Assistance with cooking/housework;Assist for transportation;Help with stairs or ramp for entrance    Equipment Recommendations None recommended by PT  Recommendations for Other Services       Functional Status Assessment Patient has had a recent decline in their functional status and demonstrates the ability to make significant improvements in function in a reasonable and predictable amount of time.     Precautions / Restrictions  Precautions Precautions: Cervical Precaution Booklet Issued: Yes (comment) Precaution Comments: Reviewed handout and pt was cued for precautions during functional mobility. Required Braces or Orthoses: Cervical Brace Cervical Brace: Soft collar;For comfort Restrictions Weight Bearing Restrictions: No      Mobility  Bed Mobility               General bed mobility comments: Pt was received sitting up EOB    Transfers Overall transfer level: Needs assistance Equipment used: Rolling walker (2 wheels) Transfers: Sit to/from Stand Sit to Stand: Supervision           General transfer comment: VC's for hand placement on seated surface for safety and improved posture with power up to full stand. No assist required however pt with significant trunk flexion throughout transfers.    Ambulation/Gait Ambulation/Gait assistance: Min guard, Supervision Gait Distance (Feet): 225 Feet Assistive device: Rolling walker (2 wheels) Gait Pattern/deviations: Step-through pattern, Decreased stride length, Trunk flexed Gait velocity: Decreased Gait velocity interpretation: <1.31 ft/sec, indicative of household ambulator   General Gait Details: VC's throughout for improved posture, closer walker proximity, and forward gaze. No overt LOB noted however pt appears reliant on RW at times. She was able to walk around the bed to the chair utilizing bed rails for support (1 UE)  Stairs Stairs: Yes Stairs assistance: Min guard Stair Management: Two rails, Step to pattern, Alternating pattern, Forwards Number of Stairs: 10 General stair comments: Pt with uncoordinated stair negotiation. VC's for step-to pattern for safety but pt with difficulty adhering to this.  Wheelchair Mobility    Modified Rankin (Stroke Patients Only)       Balance Overall balance assessment: Mild deficits observed, not formally tested  Pertinent  Vitals/Pain Pain Assessment Pain Assessment: Faces Faces Pain Scale: Hurts a little bit Pain Location: neck- surgical Pain Descriptors / Indicators: Operative site guarding, Discomfort Pain Intervention(s): Limited activity within patient's tolerance, Monitored during session, Repositioned    Home Living Family/patient expects to be discharged to:: Private residence Living Arrangements: Spouse/significant other Available Help at Discharge: Family Type of Home: House Home Access: Stairs to enter Entrance Stairs-Rails: None Entrance Stairs-Number of Steps: 2 Alternate Level Stairs-Number of Steps: 16 Home Layout: Two level;Bed/bath upstairs (shower on 2nd floor (tub on 1st), bedroom on 1st) Home Equipment: Rolling Walker (2 wheels);Cane - single point;Shower seat;Grab bars - tub/shower;Hand held shower head      Prior Function Prior Level of Function : Independent/Modified Independent;Driving             Mobility Comments: used a cane some. ADLs Comments: using shower on 2nd floor since it is a walk in shower, independent and drives     Hand Dominance   Dominant Hand: Right    Extremity/Trunk Assessment   Upper Extremity Assessment Upper Extremity Assessment: Defer to OT evaluation    Lower Extremity Assessment Lower Extremity Assessment: Generalized weakness (Mild. Pt reports prior B TKA)    Cervical / Trunk Assessment Cervical / Trunk Assessment: Neck Surgery  Communication   Communication: No difficulties  Cognition Arousal/Alertness: Awake/alert Behavior During Therapy: WFL for tasks assessed/performed Overall Cognitive Status: Within Functional Limits for tasks assessed                                          General Comments      Exercises     Assessment/Plan    PT Assessment Patient needs continued PT services  PT Problem List Decreased strength;Decreased range of motion;Decreased activity tolerance;Decreased balance;Decreased  mobility;Decreased knowledge of use of DME;Decreased safety awareness;Decreased knowledge of precautions;Pain       PT Treatment Interventions DME instruction;Gait training;Stair training;Functional mobility training;Therapeutic activities;Balance training;Therapeutic exercise;Patient/family education    PT Goals (Current goals can be found in the Care Plan section)  Acute Rehab PT Goals Patient Stated Goal: Home today PT Goal Formulation: With patient Time For Goal Achievement: 12/28/22 Potential to Achieve Goals: Good    Frequency Min 5X/week     Co-evaluation               AM-PAC PT "6 Clicks" Mobility  Outcome Measure Help needed turning from your back to your side while in a flat bed without using bedrails?: A Little Help needed moving from lying on your back to sitting on the side of a flat bed without using bedrails?: A Little Help needed moving to and from a bed to a chair (including a wheelchair)?: A Little Help needed standing up from a chair using your arms (e.g., wheelchair or bedside chair)?: A Little Help needed to walk in hospital room?: A Little Help needed climbing 3-5 steps with a railing? : A Little 6 Click Score: 18    End of Session Equipment Utilized During Treatment: Gait belt;Cervical collar Activity Tolerance: Patient tolerated treatment well Patient left: in chair;with call bell/phone within reach Nurse Communication: Mobility status PT Visit Diagnosis: Unsteadiness on feet (R26.81);Pain Pain - part of body:  (back)    Time: 5462-7035 PT Time Calculation (min) (ACUTE ONLY): 38 min   Charges:   PT Evaluation $PT Eval Low Complexity: 1 Low PT Treatments $Gait  Training: 23-37 mins        Rolinda Roan, PT, DPT Acute Rehabilitation Services Secure Chat Preferred Office: [phone]   Thelma Comp 12/21/2022, 10:25 AM

## 2022-12-21 NOTE — Progress Notes (Signed)
Orthopedic Tech Progress Note Patient Details:  Terri Rodriguez [birth year] [id number] Soft collar was applied by patient's nurse.  Ortho Devices Type of Ortho Device: Soft collar Ortho Device/Splint Location: Neck Ortho Device/Splint Interventions: Ordered      Brieanne Mignone E Irfan Veal 12/21/2022, 9:23 AM

## 2022-12-21 NOTE — Discharge Summary (Signed)
Physician Discharge Summary  Patient ID: Terri Rodriguez MRN: [id number] DOB/AGE: 78-16-1947 78 y.o.  Admit date: 12/20/2022 Discharge date: 12/21/2022  Admission Diagnoses: Cervical spondylosis with myelopathy and radiculopathy C4-5 C5-6 and C6-C7  Discharge Diagnoses: Cervical spondylosis with myelopathy and radiculopathy C4-5 C5-6 and C6-7 Principal Problem:   Cervical myelopathy [facility])   Discharged Condition: good  Hospital Course: Patient was admitted to undergo surgical decompression arthrodesis C4-C7.  He tolerated surgery well.  Consults: None  Significant Diagnostic Studies: None  Treatments: surgery: See op note  Discharge Exam: Blood pressure 136/63, pulse 96, temperature 98.7 F (37.1 C), temperature source Oral, resp. rate 17, height 5' 4.5" (1.638 m), weight 106.6 kg, SpO2 94 %. Station is intact incision is clean and dry motor function is normal  Disposition: Discharge disposition: 01-Home or Self Care       Discharge Instructions     Call MD for:  redness, tenderness, or signs of infection (pain, swelling, redness, odor or green/yellow discharge around incision site)   Complete by: As directed    Call MD for:  severe uncontrolled pain   Complete by: As directed    Call MD for:  temperature >100.4   Complete by: As directed    Diet - low sodium heart healthy   Complete by: As directed    Discharge instructions   Complete by: As directed    Okay to shower. Do not apply salves or appointments to incision. No heavy lifting with the upper extremities greater than 10 pounds. May resume driving when not requiring pain medication and patient feels comfortable with doing so.   Incentive spirometry RT   Complete by: As directed    Increase activity slowly   Complete by: As directed       Allergies as of 12/21/2022       Reactions   Codeine Nausea Only   Crab [shellfish Allergy] Itching, Nausea And Vomiting   Palm and sole of feet itch- stone crab Per pt-  betadine ok        Medication List     TAKE these medications    acetaminophen 500 MG tablet Commonly known as: TYLENOL Take 1 tablet (500 mg total) by mouth every 6 (six) hours as needed for fever or mild pain. What changed: when to take this   amLODipine 5 MG tablet Commonly known as: NORVASC TAKE 1 TABLET BY MOUTH EVERY DAY What changed: when to take this   amoxicillin 500 MG capsule Commonly known as: AMOXIL Take 2,000 mg by mouth See admin instructions. Take 2000 mg by mouth 1 hour prior to dental procedures.   aspirin EC 81 MG tablet Take 81 mg by mouth daily.   atorvastatin 20 MG tablet Commonly known as: LIPITOR TAKE 1 TABLET BY MOUTH EVERY DAY AT 6 PM   Azelaic Acid 15 % gel Apply 1 application topically 2 (two) times daily as needed (rosacea). After skin is thoroughly washed and patted dry, gently but thoroughly massage a thin film of azelaic acid cream into the affected area t daily,after shower.   carvedilol 3.125 MG tablet Commonly known as: COREG TAKE 1 TABLET BY MOUTH 2 TIMES DAILY WITH A meal   celecoxib 100 MG capsule Commonly known as: CELEBREX TAKE 1 CAPSULE BY MOUTH 2 TIMES DAILY What changed:  how much to take when to take this   clobetasol 0.05 % external solution Commonly known as: TEMOVATE Apply 1 application topically daily as needed (itching).   DULoxetine 60 MG  capsule Commonly known as: CYMBALTA TAKE 1 CAPSULE BY MOUTH EVERY DAY What changed:  how much to take when to take this   estradiol 0.5 MG tablet Commonly known as: ESTRACE 1/2 tab daily What changed:  how much to take how to take this when to take this additional instructions   fexofenadine 180 MG tablet Commonly known as: ALLEGRA Take 180 mg by mouth daily with supper.   methocarbamol 500 MG tablet Commonly known as: ROBAXIN Take 1 tablet (500 mg total) by mouth every 6 (six) hours as needed for muscle spasms.   metroNIDAZOLE 1 % gel Commonly known as:  METROGEL Apply 1 Application topically daily as needed (rosacea).   multivitamin with minerals Tabs tablet Take 1 tablet by mouth daily. Centrum Women's no Iron   nitroGLYCERIN 0.4 MG SL tablet Commonly known as: NITROSTAT PLACE 1 TABLET UNDER THE TONGUE EVERY 5 MINUTES AS NEEDED FOR CHEST PAIN What changed:  how much to take when to take this reasons to take this additional instructions   omeprazole 20 MG capsule Commonly known as: PRILOSEC Take 40 mg by mouth every morning.   oseltamivir 75 MG capsule Commonly known as: Tamiflu Take 1 capsule (75 mg total) by mouth daily.   oxyCODONE-acetaminophen 5-325 MG tablet Commonly known as: Percocet Take 1 tablet by mouth every 4 (four) hours as needed for severe pain.   psyllium 0.52 g capsule Commonly known as: REGULOID Take 0.52 g by mouth 2 (two) times daily as needed (bowel).   ramipril 10 MG capsule Commonly known as: ALTACE TAKE 1 CAPSULE BY MOUTH EVERY DAY What changed:  how much to take when to take this   Tums Ultra 1000 400 MG chewable tablet Generic drug: calcium elemental as carbonate Chew 1,000 mg by mouth daily as needed for heartburn.   valACYclovir 1000 MG tablet Commonly known as: VALTREX TAKE 2 TABLET BY MOUTH Q12 hours X 2 DOSES WITH FEVER BLISTERS         Signed: Earleen Newport 12/21/2022, 9:17 AM

## 2022-12-21 NOTE — Evaluation (Signed)
Occupational Therapy Evaluation Patient Details Name: Terri Rodriguez MRN: [id number] DOB: [birth year] Today's Date: 12/21/2022   History of Present Illness Pt is a 77 y/o female presenting on 1/16 for same day C4-5, C5-6, C6-7 ACDF. PMH includes: cataract, cholelithiasis, CAD, hiatal hernia, IBS, sleep apnea, prior back surgery, B TKA, B THA.   Clinical Impression   PTA patient independent and driving. Admitted for above and presents with problem list below. Reviewed cervical precautions, ADL compensatory techniques, recommendations, safety and DME.  She reports her spouse is available to assist as needed. Completing mobility using RW with supervision, ADLs with up to min assist.  Min cueing for cervical precaution adherence, pt self correcting as well. Based on performance, no further OT needs identified after dc.  Will follow acutely to ensure adherence to cervical precautions and progression of independence.      Recommendations for follow up therapy are one component of a multi-disciplinary discharge planning process, led by the attending physician.  Recommendations may be updated based on patient status, additional functional criteria and insurance authorization.   Follow Up Recommendations  No OT follow up     Assistance Recommended at Discharge Intermittent Supervision/Assistance  Patient can return home with the following A little help with bathing/dressing/bathroom;Assistance with cooking/housework;Assist for transportation;Help with stairs or ramp for entrance    Functional Status Assessment  Patient has had a recent decline in their functional status and demonstrates the ability to make significant improvements in function in a reasonable and predictable amount of time.  Equipment Recommendations  None recommended by OT    Recommendations for Other Services PT consult     Precautions / Restrictions Precautions Precautions: Cervical Precaution Booklet Issued: Yes  (comment) Precaution Comments: reviewed with pt Restrictions Weight Bearing Restrictions: No      Mobility Bed Mobility Overal bed mobility: Modified Independent             General bed mobility comments: HOB elevated long sitting and turn to EOB, reviewed log roll technique    Transfers Overall transfer level: Needs assistance Equipment used: Rolling walker (2 wheels) Transfers: Sit to/from Stand Sit to Stand: Supervision           General transfer comment: cueing for posture and safety      Balance Overall balance assessment: Mild deficits observed, not formally tested                                         ADL either performed or assessed with clinical judgement   ADL Overall ADL's : Needs assistance/impaired     Grooming: Supervision/safety;Standing           Upper Body Dressing : Supervision/safety;Standing   Lower Body Dressing: Minimal assistance;Sit to/from stand Lower Body Dressing Details (indicate cue type and reason): to don socks Toilet Transfer: Supervision/safety;Ambulation;Rolling walker (2 wheels)           Functional mobility during ADLs: Supervision/safety;Rolling walker (2 wheels);Cueing for safety General ADL Comments: reviewed cervical precautions, safety, ADL compensatory techniques, recommendations     Vision   Vision Assessment?: No apparent visual deficits     Perception     Praxis      Pertinent Vitals/Pain Pain Assessment Pain Assessment: Faces Faces Pain Scale: Hurts a little bit Pain Location: neck- surgical Pain Descriptors / Indicators: Operative site guarding, Discomfort Pain Intervention(s): Limited activity within patient's tolerance, Monitored during session,  Repositioned     Hand Dominance Right   Extremity/Trunk Assessment Upper Extremity Assessment Upper Extremity Assessment: Generalized weakness (within cervical precautions, denies sensory deficits)   Lower Extremity  Assessment Lower Extremity Assessment: Defer to PT evaluation   Cervical / Trunk Assessment Cervical / Trunk Assessment: Neck Surgery   Communication Communication Communication: No difficulties   Cognition Arousal/Alertness: Awake/alert Behavior During Therapy: WFL for tasks assessed/performed Overall Cognitive Status: Within Functional Limits for tasks assessed                                 General Comments: verbose, redirection to attend but anticipate baseline     General Comments       Exercises     Shoulder Instructions      Home Living Family/patient expects to be discharged to:: Private residence Living Arrangements: Spouse/significant other Available Help at Discharge: Family Type of Home: House Home Access: Stairs to enter Technical brewer of Steps: 2 Entrance Stairs-Rails: None Home Layout: Two level;Bed/bath upstairs (shower on 2nd floor (tub on 1st), bedroom on 1st) Alternate Level Stairs-Number of Steps: 16 Alternate Level Stairs-Rails: Right Bathroom Shower/Tub: Tub/shower unit;Walk-in shower   Bathroom Toilet: Handicapped height     Home Equipment: Conservation officer, nature (2 wheels);Cane - single point;Shower seat;Grab bars - tub/shower;Hand held shower head          Prior Functioning/Environment Prior Level of Function : Independent/Modified Independent;Driving               ADLs Comments: using shower on 2nd floor since it is a walk in shower, independent and drivie        OT Problem List: Decreased strength;Decreased activity tolerance;Impaired balance (sitting and/or standing);Decreased safety awareness;Decreased knowledge of use of DME or AE;Decreased knowledge of precautions;Obesity      OT Treatment/Interventions:      OT Goals(Current goals can be found in the care plan section) Acute Rehab OT Goals Patient Stated Goal: home OT Goal Formulation: With patient  OT Frequency:      Co-evaluation               AM-PAC OT "6 Clicks" Daily Activity     Outcome Measure Help from another person eating meals?: None Help from another person taking care of personal grooming?: A Little Help from another person toileting, which includes using toliet, bedpan, or urinal?: A Little Help from another person bathing (including washing, rinsing, drying)?: A Little Help from another person to put on and taking off regular upper body clothing?: A Little Help from another person to put on and taking off regular lower body clothing?: A Little 6 Click Score: 19   End of Session Equipment Utilized During Treatment: Rolling walker (2 wheels) Nurse Communication: Mobility status  Activity Tolerance: Patient tolerated treatment well Patient left: with call bell/phone within reach;Other (comment) (sitting EOB)  OT Visit Diagnosis: Pain;Muscle weakness (generalized) (M62.81) Pain - part of body:  (neck)                Time: 3295-1884 OT Time Calculation (min): 22 min Charges:  OT General Charges $OT Visit: 1 Visit OT Evaluation $OT Eval Low Complexity: 1 Low  Jolaine Artist, OT Acute Rehabilitation Services Office [phone]   Delight Stare 12/21/2022, 9:16 AM

## 2022-12-22 ENCOUNTER — Telehealth: Payer: Self-pay

## 2022-12-22 NOTE — Patient Outreach (Signed)
  Care Coordination TOC Note Transition Care Management Follow-up Telephone Call Date of discharge and from where: 12/21/22-[facility] Dx: "cervical myelopathy" How have you been since you were released from the hospital? Patient pleased to report ho well she is doing. Pain is controlled and managed. She rates it at a level of 3-4 out of 10. She is taking Percocet. She voices that she did not like Robaxin-it made her a little dizzy. Appetite has been good. LBM was yesterday. She is up walking and moving around-wearing soft collar. She voices incision is without s/s of infection.  Any questions or concerns? No  Items Reviewed: Did the pt receive and understand the discharge instructions provided? Yes  Medications obtained and verified? Yes  Other? No  Any new allergies since your discharge? No  Dietary orders reviewed? Yes Do you have support at home? Yes -spouse  Home Care and Equipment/Supplies: Were home health services ordered? not applicable If so, what is the name of the agency? N/A  Has the agency set up a time to come to the patient's home? not applicable Were any new equipment or medical supplies ordered?  No What is the name of the medical supply agency? N/A Were you able to get the supplies/equipment? not applicable Do you have any questions related to the use of the equipment or supplies? No  Functional Questionnaire: (I = Independent and D = Dependent) ADLs: A  Bathing/Dressing- A  Meal Prep- A  Eating- I  Maintaining continence- I  Transferring/Ambulation- I  Managing Meds- I  Follow up appointments reviewed:  PCP Hospital f/u appt confirmed? No Specialist Hospital f/u appt confirmed? Yes  Scheduled to see Surgeon in 3wks-patient unsure of date-does not have papers right in front of her Are transportation arrangements needed? No  If their condition worsens, is the pt aware to call PCP or go to the Emergency Dept.? Yes Was the patient provided with contact  information for the PCP's office or ED? Yes Was to pt encouraged to call back with questions or concerns? Yes  SDOH assessments and interventions completed:   Yes SDOH Interventions Today    Flowsheet Row Most Recent Value  SDOH Interventions   Food Insecurity Interventions Intervention Not Indicated  Transportation Interventions Intervention Not Indicated       Care Coordination Interventions:  Education provided regarding post op care, pain mgmt and bowel regimen    Encounter Outcome:  Pt. Visit Completed    Enzo Montgomery, RN,BSN,CCM [facility] Telephonic Care Management Coordinator Direct Phone: [phone] Toll Free: [phone] Fax: [phone]

## 2022-12-24 ENCOUNTER — Other Ambulatory Visit: Payer: Self-pay | Admitting: Cardiovascular Disease

## 2022-12-24 ENCOUNTER — Other Ambulatory Visit: Payer: Self-pay | Admitting: Internal Medicine

## 2023-01-04 DIAGNOSIS — M4722 Other spondylosis with radiculopathy, cervical region: Secondary | ICD-10-CM | POA: Diagnosis not present

## 2023-01-23 ENCOUNTER — Encounter: Payer: Self-pay | Admitting: Internal Medicine

## 2023-01-23 DIAGNOSIS — G4733 Obstructive sleep apnea (adult) (pediatric): Secondary | ICD-10-CM

## 2023-01-23 NOTE — Telephone Encounter (Signed)
Please order her DME- replace old CPAP machine auto 4-18, ask of choice, humidifier, supplies, AirView/ card.  She has used it compliantly with benefit since original diagnostic sleep study in 2008. We can send that study also to DME.

## 2023-01-23 NOTE — Telephone Encounter (Signed)
Dr. Annamaria Boots, please see mychart messages sent by pt and advise.

## 2023-01-25 ENCOUNTER — Other Ambulatory Visit: Payer: Self-pay | Admitting: Cardiovascular Disease

## 2023-02-21 ENCOUNTER — Encounter: Payer: Self-pay | Admitting: Internal Medicine

## 2023-02-22 DIAGNOSIS — G4733 Obstructive sleep apnea (adult) (pediatric): Secondary | ICD-10-CM | POA: Diagnosis not present

## 2023-02-22 NOTE — Telephone Encounter (Signed)
Hi Cierre   This is a tough one .thanks for your good wishes  If severe may need to take the oxycodone for now Since you are post op even though back is not the surgical site ; Have  you gotten back with [facility] about the severity of the pain and advice help with  pain management?   Not sure any of the meds we tried int he past were more helpful than having side effects for pain .  ? Would  local lidocaine patches help ?( Ask [facility])  Viona Gilmore

## 2023-02-23 ENCOUNTER — Other Ambulatory Visit: Payer: Self-pay | Admitting: Internal Medicine

## 2023-02-24 DIAGNOSIS — H353131 Nonexudative age-related macular degeneration, bilateral, early dry stage: Secondary | ICD-10-CM | POA: Diagnosis not present

## 2023-02-24 DIAGNOSIS — H2513 Age-related nuclear cataract, bilateral: Secondary | ICD-10-CM | POA: Diagnosis not present

## 2023-02-24 DIAGNOSIS — H40013 Open angle with borderline findings, low risk, bilateral: Secondary | ICD-10-CM | POA: Diagnosis not present

## 2023-02-24 DIAGNOSIS — H16223 Keratoconjunctivitis sicca, not specified as Sjogren's, bilateral: Secondary | ICD-10-CM | POA: Diagnosis not present

## 2023-02-27 NOTE — Telephone Encounter (Signed)
I sent a message  back last week directly to patient.  I think its on a different encounter.

## 2023-03-03 ENCOUNTER — Other Ambulatory Visit: Payer: Self-pay | Admitting: Cardiovascular Disease

## 2023-03-03 NOTE — Telephone Encounter (Signed)
*  STAT* If patient is at the pharmacy, call can be transferred to refill team.   1. Which medications need to be refilled? (please list name of each medication and dose if known)  Ramipril  2. Which pharmacy/location (including street and city if local pharmacy) is medication to be sent to? [facility] [address] [place]  3. Do they need a 30 day or 90 day supply? Need enough until her appoitment on 5-1--24

## 2023-03-08 DIAGNOSIS — G4733 Obstructive sleep apnea (adult) (pediatric): Secondary | ICD-10-CM | POA: Diagnosis not present

## 2023-03-08 DIAGNOSIS — Z96659 Presence of unspecified artificial knee joint: Secondary | ICD-10-CM | POA: Diagnosis not present

## 2023-03-20 DIAGNOSIS — H2513 Age-related nuclear cataract, bilateral: Secondary | ICD-10-CM | POA: Diagnosis not present

## 2023-03-22 DIAGNOSIS — L570 Actinic keratosis: Secondary | ICD-10-CM | POA: Diagnosis not present

## 2023-03-22 DIAGNOSIS — L723 Sebaceous cyst: Secondary | ICD-10-CM | POA: Diagnosis not present

## 2023-03-22 DIAGNOSIS — L821 Other seborrheic keratosis: Secondary | ICD-10-CM | POA: Diagnosis not present

## 2023-03-22 DIAGNOSIS — L578 Other skin changes due to chronic exposure to nonionizing radiation: Secondary | ICD-10-CM | POA: Diagnosis not present

## 2023-03-22 DIAGNOSIS — D2261 Melanocytic nevi of right upper limb, including shoulder: Secondary | ICD-10-CM | POA: Diagnosis not present

## 2023-03-25 DIAGNOSIS — G4733 Obstructive sleep apnea (adult) (pediatric): Secondary | ICD-10-CM | POA: Diagnosis not present

## 2023-03-30 ENCOUNTER — Other Ambulatory Visit: Payer: Self-pay | Admitting: Cardiovascular Disease

## 2023-04-05 ENCOUNTER — Ambulatory Visit: Payer: BLUE CROSS/BLUE SHIELD | Attending: Nurse Practitioner | Admitting: Nurse Practitioner

## 2023-04-05 ENCOUNTER — Encounter: Payer: Self-pay | Admitting: Nurse Practitioner

## 2023-04-05 VITALS — BP 132/84 | HR 73 | Ht 64.5 in | Wt 246.4 lb

## 2023-04-05 DIAGNOSIS — I2510 Atherosclerotic heart disease of native coronary artery without angina pectoris: Secondary | ICD-10-CM

## 2023-04-05 DIAGNOSIS — I10 Essential (primary) hypertension: Secondary | ICD-10-CM

## 2023-04-05 DIAGNOSIS — G4733 Obstructive sleep apnea (adult) (pediatric): Secondary | ICD-10-CM

## 2023-04-05 DIAGNOSIS — I7300 Raynaud's syndrome without gangrene: Secondary | ICD-10-CM

## 2023-04-05 DIAGNOSIS — E785 Hyperlipidemia, unspecified: Secondary | ICD-10-CM | POA: Diagnosis not present

## 2023-04-05 NOTE — Patient Instructions (Signed)
Medication Instructions:  Your physician recommends that you continue on your current medications as directed. Please refer to the Current Medication list given to you today.  *If you need a refill on your cardiac medications before your next appointment, please call your pharmacy*   Lab Work: Your physician recommends that you return for lab work at your convenience.  Fasting Lipid panel, CBC, CMET  If you have labs (blood work) drawn today and your tests are completely normal, you will receive your results only by: MyChart Message (if you have MyChart) OR A paper copy in the mail If you have any lab test that is abnormal or we need to change your treatment, we will call you to review the results.   Testing/Procedures: NONE ordered at this time of appointment     Follow-Up: At [facility], you and your health needs are our priority.  As part of our continuing mission to provide you with exceptional heart care, we have created designated Provider Care Teams.  These Care Teams include your primary Cardiologist (physician) and Advanced Practice Providers (APPs -  Physician Assistants and Nurse Practitioners) who all work together to provide you with the care you need, when you need it.  We recommend signing up for the patient portal called "MyChart".  Sign up information is provided on this After Visit Summary.  MyChart is used to connect with patients for Virtual Visits (Telemedicine).  Patients are able to view lab/test results, encounter notes, upcoming appointments, etc.  Non-urgent messages can be sent to your provider as well.   To learn more about what you can do with MyChart, go to [email or web address].    Your next appointment:   1 year(s)  Provider:   Nicki Guadalajara, MD     Other Instructions

## 2023-04-05 NOTE — Progress Notes (Signed)
Office Visit    Patient Name: Terri Rodriguez Date of Encounter: 04/05/2023  Primary Care Provider:  Madelin Headings, MD Primary Cardiologist:  Nicki Guadalajara, MD  Chief Complaint    77 year old female with a history of mild CAD, hypertension, hyperlipidemia, Raynaud's phenomenon, and OSA who presents for follow-up related to CAD.  Past Medical History    Past Medical History:  Diagnosis Date   ALLERGIC RHINITIS    Allergy    Anginal pain (HCC)    Blood transfusion without reported diagnosis    Cataract    small right   Cholelithiasis 06/09/2008   gallstones 2009 on ct scan   Complication of anesthesia 2012   went to ICU after bilateral knees , unstable vital signs oct 2012, has surgery since did ok   Coronary artery disease    30 % narrowing lad   Diverticulitis    EPICONDYLITIS, LATERAL    osteoarthritis, previous joint replacements   Fall 10/2018   GERD    Hepatic hemangioma 06/09/2008   Hiatal hernia 03/2000   Hx of adenomatous polyp of colon 06/05/2008   Hx of cardiac catheterization 2008   clean coronarys DrKelly    Hx of chest pain 2003   neg cath remot hx of narrowwing lad in 2003 nl 2008, none in many years   HYPERLIPIDEMIA    IBS (irritable bowel syndrome)    LEUKOPENIA, CHRONIC    Mononucleosis 1963   RAYNAUD'S SYNDROME, HX OF    improved after cardiac meds initiated   Rosacea    facial   Sleep apnea    SLEEP APNEA, OBSTRUCTIVE    cpap, settings "automatic" settings 11-12   Subacute thyroiditis    UNSPECIFIED ANEMIA    Past Surgical History:  Procedure Laterality Date   ABDOMINAL HYSTERECTOMY  1993   bso   ANTERIOR CERVICAL DECOMP/DISCECTOMY FUSION N/A 12/20/2022   Procedure: Cervical four-five Cervical five-six Cervical six-seven Anterior Cervical Decompression Fusion;  Surgeon: Barnett Abu, MD;  Location: [facility];  Service: Neurosurgery;  Laterality: N/A;   BACK SURGERY  2008   l 3 to l4 l4 to l5   BARTHOLIN GLAND CYST EXCISION Right 09/09/2019    Procedure: EXCISION OF VULVAR MASS TIMES TWO;  Surgeon: Jerene Bears, MD;  Location: [facility];  Service: Gynecology;  Laterality: Right;   CARDIAC CATHETERIZATION  04/07/2007   Noncritical coronary artery disease. Contiue medical therapy.   CARDIAC CATHETERIZATION  12/03/2007   Normal LV function. Mild angiographic mitral valve prolapse. Normal coronary arteries.   CARDIOVASCULAR STRESS TEST  11/09/2007   Moderate ischemia in Mid Anterior, Mid Anteroseptal, Apical Anterior, and Apical Septal regions. EKG negative for ischemia.   CESAREAN SECTION  1985, 1989   COLON RESECTION Left 01/03/2022   Procedure: COLON RESECTION;  Surgeon: Quentin Ore, MD;  Location: [facility];  Service: General;  Laterality: Left;   COLOSTOMY  01/03/2022   Procedure: COLOSTOMY;  Surgeon: Quentin Ore, MD;  Location: [facility];  Service: General;;   dental implants  11/2002,11/2011,11/2012   dental implants     x 6   DENTAL SURGERY     DILATION AND CURETTAGE OF UTERUS     d and e 1988 and 1990   JOINT REPLACEMENT  09/21/2011   bilateral knee   KNEE ARTHROSCOPY     bilateral   LAPAROTOMY N/A 01/03/2022   Procedure: EXPLORATORY LAPAROTOMY;  Surgeon: Quentin Ore, MD;  Location: [facility];  Service: General;  Laterality: N/A;   lumbar surgery fixation with disc replacement  10/08   Left   NASAL SEPTOPLASTY W/ TURBINOPLASTY  05/2000   NOCTURNAL POLYSOMNOGRAM  12/31/2006   Severe obstructive sleep apnea. AHI-87/hr   REPLACEMENT TOTAL KNEE  2012   bilateral   TONSILLECTOMY  1952   adenoids too   TOTAL HIP ARTHROPLASTY Left 10/21/2013   Procedure: LEFT TOTAL HIP ARTHROPLASTY ANTERIOR APPROACH;  Surgeon: Loanne Drilling, MD;  Location: [facility];  Service: Orthopedics;  Laterality: Left;   TOTAL HIP ARTHROPLASTY Right 07/16/2014   Procedure: RIGHT TOTAL HIP ARTHROPLASTY ANTERIOR APPROACH;  Surgeon: Loanne Drilling, MD;  Location: [facility];  Service: Orthopedics;  Laterality: Right;    TRANSTHORACIC ECHOCARDIOGRAM  11/09/2007   EF 58%, LV systolic function normal. Mild aortic root dilation.   WRIST SURGERY Left 04/03/14   ORIF "distal radial head fracture"   XI ROBOTIC ASSISTED COLOSTOMY TAKEDOWN N/A 09/26/2022   Procedure: ROBOTIC OSTOMY REVERSAL;  Surgeon: Quentin Ore, MD;  Location: [facility];  Service: General;  Laterality: N/A;    Allergies  Allergies  Allergen Reactions   Codeine Nausea Only   Crab [Shellfish Allergy] Itching and Nausea And Vomiting    Palm and sole of feet itch- stone crab  Per pt- betadine ok     Labs/Other Studies Reviewed    The following studies were reviewed today: Echo 2017: LV EF: 55% -   60%   -------------------------------------------------------------------  Indications:     CAD (I25.10).   -------------------------------------------------------------------  Study Conclusions   - Left ventricle: The cavity size was normal. Systolic function was    normal. The estimated ejection fraction was in the range of 55%    to 60%. Wall motion was normal; there were no regional wall    motion abnormalities. Left ventricular diastolic function    parameters were normal.  - Aortic valve: There was trivial regurgitation.   Transthoracic echocardiography.  M-mode, complete 2D, spectral  Doppler, and color Doppler.  Birthdate:  Patient birthdate:  1946/09/24.  Age:  Patient is 77 yr old.  Sex:  Gender: female.  BMI: 39.4 kg/m^2.  Blood pressure:     124/68  Patient status:  Outpatient.  Study date:  Study date: 03/07/2016. Study time: 03:16  PM.  Location:  [facility]   -------------------------------------------------------------------   -------------------------------------------------------------------  Left ventricle:  The cavity size was normal. Systolic function was  normal. The estimated ejection fraction was in the range of 55% to  60%. Wall motion was normal; there were no regional wall motion   abnormalities. The transmitral flow pattern was normal. The  deceleration time of the early transmitral flow velocity was  normal. The pulmonary vein flow pattern was normal. The tissue  Doppler parameters were normal. Left ventricular diastolic function  parameters were normal.   -------------------------------------------------------------------  Aortic valve:   Trileaflet; normal thickness leaflets. Mobility was  not restricted.  Doppler:  Transvalvular velocity was within the  normal range. There was no stenosis. There was trivial  regurgitation.   -------------------------------------------------------------------  Aorta: Aortic root: The aortic root was normal in size.   -------------------------------------------------------------------  Mitral valve:   Structurally normal valve.   Mobility was not  restricted.  Doppler:  Transvalvular velocity was within the normal  range. There was no evidence for stenosis. There was no  regurgitation.    Peak gradient (D): 3 mm Hg.   -------------------------------------------------------------------  Left atrium:  The atrium was normal in size.   -------------------------------------------------------------------  Right ventricle:  The cavity size was normal. Wall thickness was  normal. Systolic function was normal.   -------------------------------------------------------------------  Pulmonic valve:    The valve appears to be grossly normal.  Doppler:  Transvalvular velocity was within the normal range. There  was no evidence for stenosis. There was trivial regurgitation.   -------------------------------------------------------------------  Tricuspid valve:   Structurally normal valve.    Doppler:  Transvalvular velocity was within the normal range. There was  trivial regurgitation.   -------------------------------------------------------------------  Pulmonary artery:   The main pulmonary artery was normal-sized.  Systolic  pressure was within the normal range.   -------------------------------------------------------------------  Right atrium:  The atrium was normal in size.   -------------------------------------------------------------------  Pericardium: There was no pericardial effusion.   -------------------------------------------------------------------  Systemic veins:  Inferior vena cava: The vessel was normal in size. The  respirophasic diameter changes were in the normal range (= 50%),  consistent with normal central venous pressure. Diameter: 20 mm.    Recent Labs: 12/20/2022: BUN 25; Creatinine, Ser 0.66; Hemoglobin 12.7; Platelets 222; Potassium 4.3; Sodium 138  Recent Lipid Panel    Component Value Date/Time   CHOL 152 08/04/2021 0905   CHOL 155 06/17/2019 1109   TRIG 61.0 08/04/2021 0905   HDL 72.70 08/04/2021 0905   HDL 78 06/17/2019 1109   CHOLHDL 2 08/04/2021 0905   VLDL 12.2 08/04/2021 0905   LDLCALC 67 08/04/2021 0905   LDLCALC 57 10/15/2020 0922    History of Present Illness    77 year old female with the above past medical history including mild CAD, hypertension, hyperlipidemia, Raynaud's phenomenon, and OSA.   Cardiac catheterization in May 2003 revealed mild CAD.  Repeat cardiac catheterization in 2008 prior to back surgery and following abnormal nuclear study showed normalization of coronary arteries without significant obstruction.  She does have a history of incomplete RBBB.  She also has a history of Raynaud's phenomenon managed with amlodipine and ramipril.  She was last seen in the office on 12/13/2021 and was stable from a cardiac standpoint.  She denied symptoms concerning for angina.  She was hospitalized in February 2023 in the setting of sepsis secondary to acute ischemic colitis with perforation s/p Hartmann left colectomy/colostomy.  She underwent robotic colostomy reversal in 09/2022.  She underwent anterior cervical decompression C4-5, C5-6, and C6-7 arthrodesis  with structural titanium implant allograft and anterior fixation with cervical plate Z6-X0 in January 2024.  She presents today for follow-up.  Since her last visit she has done well from a cardiac standpoint.  She denies any symptoms concerning for angina.  She has recovered well from her surgeries and hospitalizations.  He does continue to struggle with orthopedic issues but overall, she reports feeling well.  Home Medications    Current Outpatient Medications  Medication Sig Dispense Refill   acetaminophen (TYLENOL) 500 MG tablet Take 1 tablet (500 mg total) by mouth every 6 (six) hours as needed for fever or mild pain. (Patient taking differently: Take 500 mg by mouth every morning.) 30 tablet 0   amLODipine (NORVASC) 5 MG tablet TAKE 1 TABLET BY MOUTH EVERY DAY (Patient taking differently: Take 5 mg by mouth every evening.) 90 tablet 3   aspirin EC 81 MG tablet Take 81 mg by mouth daily.     atorvastatin (LIPITOR) 20 MG tablet Take 1 tablet (20 mg total) by mouth daily. Pt. Will need an appointment in order to receive future refills. 30 tablet 0   Azelaic Acid 15 % cream Apply 1  application topically 2 (two) times daily as needed (rosacea). After skin is thoroughly washed and patted dry, gently but thoroughly massage a thin film of azelaic acid cream into the affected area t daily,after shower.     calcium elemental as carbonate (TUMS ULTRA 1000) 400 MG chewable tablet Chew 1,000 mg by mouth daily as needed for heartburn.     carvedilol (COREG) 3.125 MG tablet TAKE 1 TABLET BY MOUTH 2 TIMES DAILY WITH A MEAL 180 tablet 1   celecoxib (CELEBREX) 100 MG capsule TAKE 1 CAPSULE BY MOUTH 2 TIMES DAILY (Patient taking differently: Take 200 mg by mouth every morning.) 60 capsule 3   celecoxib (CELEBREX) 200 MG capsule TAKE 1 CAPSULE BY MOUTH EVERY DAY 90 capsule 0   DULoxetine (CYMBALTA) 60 MG capsule TAKE 1 CAPSULE BY MOUTH EVERY DAY 90 capsule 0   fexofenadine (ALLEGRA) 180 MG tablet Take 180 mg by  mouth daily with supper.     metroNIDAZOLE (METROGEL) 1 % gel Apply 1 Application topically daily as needed (rosacea).     Multiple Vitamin (MULTIVITAMIN WITH MINERALS) TABS tablet Take 1 tablet by mouth daily. Centrum Women's no Iron     omeprazole (PRILOSEC) 20 MG capsule Take 40 mg by mouth every morning.     oxyCODONE-acetaminophen (PERCOCET) 5-325 MG tablet Take 1 tablet by mouth every 4 (four) hours as needed for severe pain. 30 tablet 0   psyllium (REGULOID) 0.52 g capsule Take 0.52 g by mouth 2 (two) times daily as needed (bowel).     ramipril (ALTACE) 10 MG capsule TAKE 1 CAPSULE BY MOUTH EVERY DAY 90 capsule 3   valACYclovir (VALTREX) 1000 MG tablet TAKE 2 TABLET BY MOUTH Q12 hours X 2 DOSES WITH FEVER BLISTERS 30 tablet 1   amoxicillin (AMOXIL) 500 MG capsule Take 2,000 mg by mouth See admin instructions. Take 2000 mg by mouth 1 hour prior to dental procedures. (Patient not taking: Reported on 04/05/2023)     clobetasol (TEMOVATE) 0.05 % external solution Apply 1 application topically daily as needed (itching).     estradiol (ESTRACE) 0.5 MG tablet 1/2 tab daily (Patient taking differently: Take 0.25 mg by mouth every other day.) 45 tablet 4   methocarbamol (ROBAXIN) 500 MG tablet Take 1 tablet (500 mg total) by mouth every 6 (six) hours as needed for muscle spasms. (Patient not taking: Reported on 04/05/2023) 30 tablet 1   nitroGLYCERIN (NITROSTAT) 0.4 MG SL tablet PLACE 1 TABLET UNDER THE TONGUE EVERY 5 MINUTES AS NEEDED FOR CHEST PAIN (Patient not taking: Reported on 04/05/2023) 25 tablet 3   oseltamivir (TAMIFLU) 75 MG capsule Take 1 capsule (75 mg total) by mouth daily. (Patient not taking: Reported on 04/05/2023) 10 capsule 0   No current facility-administered medications for this visit.     Review of Systems    She denies chest pain, palpitations, dyspnea, pnd, orthopnea, n, v, dizziness, syncope, edema, weight gain, or early satiety. All other systems reviewed and are otherwise  negative except as noted above.   Physical Exam    VS:  BP 132/84   Pulse 73   Ht 5' 4.5" (1.638 m)   Wt 246 lb 6.4 oz (111.8 kg)   SpO2 97%   BMI 41.64 kg/m  GEN: Well nourished, well developed, in no acute distress. HEENT: normal. Neck: Supple, no JVD, carotid bruits, or masses. Cardiac: RRR, no murmurs, rubs, or gallops. No clubbing, cyanosis, edema.  Radials/DP/PT 2+ and equal bilaterally.  Respiratory:  Respirations regular and  unlabored, clear to auscultation bilaterally. GI: Soft, nontender, nondistended, BS + x 4. MS: no deformity or atrophy. Skin: warm and dry, no rash. Neuro:  Strength and sensation are intact. Psych: Normal affect.  Accessory Clinical Findings    ECG personally reviewed by me today -NSR, 73 bpm- no acute changes.   Lab Results  Component Value Date   WBC 6.0 12/20/2022   HGB 12.7 12/20/2022   HCT 37.1 12/20/2022   MCV 95.9 12/20/2022   PLT 222 12/20/2022   Lab Results  Component Value Date   CREATININE 0.66 12/20/2022   BUN 25 (H) 12/20/2022   NA 138 12/20/2022   K 4.3 12/20/2022   CL 106 12/20/2022   CO2 23 12/20/2022   Lab Results  Component Value Date   ALT 21 01/04/2022   AST 31 01/04/2022   ALKPHOS 86 01/04/2022   BILITOT 1.0 01/04/2022   Lab Results  Component Value Date   CHOL 152 08/04/2021   HDL 72.70 08/04/2021   LDLCALC 67 08/04/2021   TRIG 61.0 08/04/2021   CHOLHDL 2 08/04/2021    Lab Results  Component Value Date   HGBA1C 5.3 09/14/2022    Assessment & Plan    1. CAD: Cath in 2008 was without significant obstruction. Stable with no anginal symptoms. No indication for ischemic evaluation. Continue Aspirin, carvedilol, amlodipine, ramipril, and Lipitor. Will update CBC, CMET.   2. Hypertension: BP well controlled. Continue current antihypertensive regimen.   3. Hyperlipidemia: LDL was 67 in 2022. Will update fasting lipid panel. Continue Lipitor.   4. Raynaud's phenomenon: Well-managed with amlodipine,  ramipril.  5. OSA: Adherent to CPAP.   6. Disposition: Follow-up in 1 year.      Joylene Grapes, NP 04/05/2023, 5:47 PM

## 2023-04-07 DIAGNOSIS — Z96659 Presence of unspecified artificial knee joint: Secondary | ICD-10-CM | POA: Diagnosis not present

## 2023-04-07 DIAGNOSIS — G4733 Obstructive sleep apnea (adult) (pediatric): Secondary | ICD-10-CM | POA: Diagnosis not present

## 2023-04-10 DIAGNOSIS — E785 Hyperlipidemia, unspecified: Secondary | ICD-10-CM | POA: Diagnosis not present

## 2023-04-10 DIAGNOSIS — I7300 Raynaud's syndrome without gangrene: Secondary | ICD-10-CM | POA: Diagnosis not present

## 2023-04-10 DIAGNOSIS — I10 Essential (primary) hypertension: Secondary | ICD-10-CM | POA: Diagnosis not present

## 2023-04-10 DIAGNOSIS — I2510 Atherosclerotic heart disease of native coronary artery without angina pectoris: Secondary | ICD-10-CM | POA: Diagnosis not present

## 2023-04-11 LAB — CBC
Hematocrit: 37 % (ref 34.0–46.6)
Hemoglobin: 12.3 g/dL (ref 11.1–15.9)
MCH: 32 pg (ref 26.6–33.0)
MCHC: 33.2 g/dL (ref 31.5–35.7)
MCV: 96 fL (ref 79–97)
Platelets: 228 10*3/uL (ref 150–450)
RBC: 3.84 x10E6/uL (ref 3.77–5.28)
RDW: 12.5 % (ref 11.7–15.4)
WBC: 3.3 10*3/uL — ABNORMAL LOW (ref 3.4–10.8)

## 2023-04-11 LAB — LIPID PANEL
Chol/HDL Ratio: 1.9 ratio (ref 0.0–4.4)
Cholesterol, Total: 162 mg/dL (ref 100–199)
HDL: 85 mg/dL (ref >39)
LDL Chol Calc (NIH): 64 mg/dL (ref 0–99)
Triglycerides: 68 mg/dL (ref 0–149)
VLDL Cholesterol Cal: 13 mg/dL (ref 5–40)

## 2023-04-11 LAB — COMPREHENSIVE METABOLIC PANEL
ALT: 33 IU/L — ABNORMAL HIGH (ref 0–32)
AST: 48 IU/L — ABNORMAL HIGH (ref 0–40)
Albumin/Globulin Ratio: 2.3 — ABNORMAL HIGH (ref 1.2–2.2)
Albumin: 4.3 g/dL (ref 3.8–4.8)
Alkaline Phosphatase: 98 IU/L (ref 44–121)
BUN/Creatinine Ratio: 26 (ref 12–28)
BUN: 18 mg/dL (ref 8–27)
Bilirubin Total: 0.4 mg/dL (ref 0.0–1.2)
CO2: 27 mmol/L (ref 20–29)
Calcium: 10 mg/dL (ref 8.7–10.3)
Chloride: 106 mmol/L (ref 96–106)
Creatinine, Ser: 0.69 mg/dL (ref 0.57–1.00)
Globulin, Total: 1.9 g/dL (ref 1.5–4.5)
Glucose: 107 mg/dL — ABNORMAL HIGH (ref 70–99)
Potassium: 5 mmol/L (ref 3.5–5.2)
Sodium: 143 mmol/L (ref 134–144)
Total Protein: 6.2 g/dL (ref 6.0–8.5)
eGFR: 89 mL/min/{1.73_m2} (ref >59)

## 2023-04-14 ENCOUNTER — Telehealth: Payer: Self-pay

## 2023-04-14 NOTE — Telephone Encounter (Signed)
Spoke with pt. Pt was notified of lab results and recommendations. Pt will f/u with her PCP about her WBC. Pt will continue her current medication and f/u as planned.

## 2023-04-17 ENCOUNTER — Ambulatory Visit (INDEPENDENT_AMBULATORY_CARE_PROVIDER_SITE_OTHER): Payer: Medicare Other

## 2023-04-17 VITALS — Ht 64.5 in | Wt 240.0 lb

## 2023-04-17 DIAGNOSIS — Z0000 Encounter for general adult medical examination without abnormal findings: Secondary | ICD-10-CM | POA: Diagnosis not present

## 2023-04-17 NOTE — Patient Instructions (Addendum)
Ms. Terri Rodriguez , Thank you for taking time to come for your Medicare Wellness Visit. I appreciate your ongoing commitment to your health goals. Please review the following plan we discussed and let me know if I can assist you in the future.   These are the goals we discussed:  Goals       Exercise 3x per week (30 min per time)      Increase physical activity (pt-stated)      I would like to lose weight      Patient stated (pt-stated)      I want to see better.        This is a list of the screening recommended for you and due dates:  Health Maintenance  Topic Date Due   Eye exam for diabetics  04/17/2023*   Yearly kidney health urinalysis for diabetes  04/18/2023*   COVID-19 Vaccine (7 - 2023-24 season) 05/03/2023*   Zoster (Shingles) Vaccine (1 of 2) 07/18/2023*   Flu Shot  07/06/2023   DTaP/Tdap/Td vaccine (3 - Tdap) 01/11/2024   Yearly kidney function blood test for diabetes  04/09/2024   Medicare Annual Wellness Visit  04/16/2024   Pneumonia Vaccine  Completed   DEXA scan (bone density measurement)  Completed   Hepatitis C Screening: USPSTF Recommendation to screen - Ages 49-79 yo.  Completed   HPV Vaccine  Aged Out   Colon Cancer Screening  Discontinued  *Topic was postponed. The date shown is not the original due date.    Advanced directives: Advance directive discussed with you today. Even though you declined this today, please call our office should you change your mind, and we can give you the proper paperwork for you to fill out.   Conditions/risks identified: None  Next appointment: Follow up in one year for your annual wellness visit    Preventive Care 65 Years and Older, Female Preventive care refers to lifestyle choices and visits with your health care provider that can promote health and wellness. What does preventive care include? A yearly physical exam. This is also called an annual well check. Dental exams once or twice a year. Routine eye exams. Ask your  health care provider how often you should have your eyes checked. Personal lifestyle choices, including: Daily care of your teeth and gums. Regular physical activity. Eating a healthy diet. Avoiding tobacco and drug use. Limiting alcohol use. Practicing safe sex. Taking low-dose aspirin every day. Taking vitamin and mineral supplements as recommended by your health care provider. What happens during an annual well check? The services and screenings done by your health care provider during your annual well check will depend on your age, overall health, lifestyle risk factors, and family history of disease. Counseling  Your health care provider may ask you questions about your: Alcohol use. Tobacco use. Drug use. Emotional well-being. Home and relationship well-being. Sexual activity. Eating habits. History of falls. Memory and ability to understand (cognition). Work and work Astronomer. Reproductive health. Screening  You may have the following tests or measurements: Height, weight, and BMI. Blood pressure. Lipid and cholesterol levels. These may be checked every 5 years, or more frequently if you are over 17 years old. Skin check. Lung cancer screening. You may have this screening every year starting at age 27 if you have a 30-pack-year history of smoking and currently smoke or have quit within the past 15 years. Fecal occult blood test (FOBT) of the stool. You may have this test every year starting at  age 95. Flexible sigmoidoscopy or colonoscopy. You may have a sigmoidoscopy every 5 years or a colonoscopy every 10 years starting at age 28. Hepatitis C blood test. Hepatitis B blood test. Sexually transmitted disease (STD) testing. Diabetes screening. This is done by checking your blood sugar (glucose) after you have not eaten for a while (fasting). You may have this done every 1-3 years. Bone density scan. This is done to screen for osteoporosis. You may have this done  starting at age 83. Mammogram. This may be done every 1-2 years. Talk to your health care provider about how often you should have regular mammograms. Talk with your health care provider about your test results, treatment options, and if necessary, the need for more tests. Vaccines  Your health care provider may recommend certain vaccines, such as: Influenza vaccine. This is recommended every year. Tetanus, diphtheria, and acellular pertussis (Tdap, Td) vaccine. You may need a Td booster every 10 years. Zoster vaccine. You may need this after age 12. Pneumococcal 13-valent conjugate (PCV13) vaccine. One dose is recommended after age 61. Pneumococcal polysaccharide (PPSV23) vaccine. One dose is recommended after age 82. Talk to your health care provider about which screenings and vaccines you need and how often you need them. This information is not intended to replace advice given to you by your health care provider. Make sure you discuss any questions you have with your health care provider. Document Released: 12/18/2015 Document Revised: 08/10/2016 Document Reviewed: 09/22/2015 Elsevier Interactive Patient Education  2017 [place] Prevention in the Home Falls can cause injuries. They can happen to people of all ages. There are many things you can do to make your home safe and to help prevent falls. What can I do on the outside of my home? Regularly fix the edges of walkways and driveways and fix any cracks. Remove anything that might make you trip as you walk through a door, such as a raised step or threshold. Trim any bushes or trees on the path to your home. Use bright outdoor lighting. Clear any walking paths of anything that might make someone trip, such as rocks or tools. Regularly check to see if handrails are loose or broken. Make sure that both sides of any steps have handrails. Any raised decks and porches should have guardrails on the edges. Have any leaves, snow, or  ice cleared regularly. Use sand or salt on walking paths during winter. Clean up any spills in your garage right away. This includes oil or grease spills. What can I do in the bathroom? Use night lights. Install grab bars by the toilet and in the tub and shower. Do not use towel bars as grab bars. Use non-skid mats or decals in the tub or shower. If you need to sit down in the shower, use a plastic, non-slip stool. Keep the floor dry. Clean up any water that spills on the floor as soon as it happens. Remove soap buildup in the tub or shower regularly. Attach bath mats securely with double-sided non-slip rug tape. Do not have throw rugs and other things on the floor that can make you trip. What can I do in the bedroom? Use night lights. Make sure that you have a light by your bed that is easy to reach. Do not use any sheets or blankets that are too big for your bed. They should not hang down onto the floor. Have a firm chair that has side arms. You can use this for support while  you get dressed. Do not have throw rugs and other things on the floor that can make you trip. What can I do in the kitchen? Clean up any spills right away. Avoid walking on wet floors. Keep items that you use a lot in easy-to-reach places. If you need to reach something above you, use a strong step stool that has a grab bar. Keep electrical cords out of the way. Do not use floor polish or wax that makes floors slippery. If you must use wax, use non-skid floor wax. Do not have throw rugs and other things on the floor that can make you trip. What can I do with my stairs? Do not leave any items on the stairs. Make sure that there are handrails on both sides of the stairs and use them. Fix handrails that are broken or loose. Make sure that handrails are as long as the stairways. Check any carpeting to make sure that it is firmly attached to the stairs. Fix any carpet that is loose or worn. Avoid having throw rugs at  the top or bottom of the stairs. If you do have throw rugs, attach them to the floor with carpet tape. Make sure that you have a light switch at the top of the stairs and the bottom of the stairs. If you do not have them, ask someone to add them for you. What else can I do to help prevent falls? Wear shoes that: Do not have high heels. Have rubber bottoms. Are comfortable and fit you well. Are closed at the toe. Do not wear sandals. If you use a stepladder: Make sure that it is fully opened. Do not climb a closed stepladder. Make sure that both sides of the stepladder are locked into place. Ask someone to hold it for you, if possible. Clearly mark and make sure that you can see: Any grab bars or handrails. First and last steps. Where the edge of each step is. Use tools that help you move around (mobility aids) if they are needed. These include: Canes. Walkers. Scooters. Crutches. Turn on the lights when you go into a dark area. Replace any light bulbs as soon as they burn out. Set up your furniture so you have a clear path. Avoid moving your furniture around. If any of your floors are uneven, fix them. If there are any pets around you, be aware of where they are. Review your medicines with your doctor. Some medicines can make you feel dizzy. This can increase your chance of falling. Ask your doctor what other things that you can do to help prevent falls. This information is not intended to replace advice given to you by your health care provider. Make sure you discuss any questions you have with your health care provider. Document Released: 09/17/2009 Document Revised: 04/28/2016 Document Reviewed: 12/26/2014 Elsevier Interactive Patient Education  2017 [employer].

## 2023-04-17 NOTE — Progress Notes (Signed)
Subjective:   Terri Rodriguez is a 77 y.o. female who presents for Medicare Annual (Subsequent) preventive examination.  Review of Systems    Virtual Visit via Telephone Note  I connected with  Terri Rodriguez on 04/17/23 at  2:00 PM EDT by telephone and verified that I am speaking with the correct person using two identifiers.  Location: Patient: Home Provider: Office Persons participating in the virtual visit: patient/Nurse Health Advisor   I discussed the limitations, risks, security and privacy concerns of performing an evaluation and management service by telephone and the availability of in person appointments. The patient expressed understanding and agreed to proceed.  Interactive audio and video telecommunications were attempted between this nurse and patient, however failed, due to patient having technical difficulties OR patient did not have access to video capability.  We continued and completed visit with audio only.  Some vital signs may be absent or patient reported.   Terri Rung, LPN  Cardiac Risk Factors include: advanced age (>60men, >44 women), Risk factor comments: Dx CAD     Objective:    Today's Vitals   04/17/23 1354  Weight: 240 lb (108.9 kg)  Height: 5' 4.5" (1.638 m)   Body mass index is 40.56 kg/m.     04/17/2023    2:08 PM 12/09/2022    4:52 PM 09/26/2022    1:43 PM 09/14/2022    1:19 PM 04/13/2022    2:55 PM 01/07/2022    6:22 PM 01/02/2022    4:41 PM  Advanced Directives  Does Patient Have a Medical Advance Directive? No No No No No No No  Would patient like information on creating a medical advance directive? No - Patient declined No - Patient declined No - Patient declined No - Patient declined No - Patient declined No - Patient declined     Current Medications (verified) Outpatient Encounter Medications as of 04/17/2023  Medication Sig   acetaminophen (TYLENOL) 500 MG tablet Take 1 tablet (500 mg total) by mouth every 6 (six) hours as  needed for fever or mild pain. (Patient taking differently: Take 500 mg by mouth every morning.)   amLODipine (NORVASC) 5 MG tablet TAKE 1 TABLET BY MOUTH EVERY DAY (Patient taking differently: Take 5 mg by mouth every evening.)   amoxicillin (AMOXIL) 500 MG capsule Take 2,000 mg by mouth See admin instructions. Take 2000 mg by mouth 1 hour prior to dental procedures. (Patient not taking: Reported on 04/05/2023)   aspirin EC 81 MG tablet Take 81 mg by mouth daily.   atorvastatin (LIPITOR) 20 MG tablet Take 1 tablet (20 mg total) by mouth daily. Pt. Will need an appointment in order to receive future refills.   Azelaic Acid 15 % cream Apply 1 application topically 2 (two) times daily as needed (rosacea). After skin is thoroughly washed and patted dry, gently but thoroughly massage a thin film of azelaic acid cream into the affected area t daily,after shower.   calcium elemental as carbonate (TUMS ULTRA 1000) 400 MG chewable tablet Chew 1,000 mg by mouth daily as needed for heartburn.   carvedilol (COREG) 3.125 MG tablet TAKE 1 TABLET BY MOUTH 2 TIMES DAILY WITH A MEAL   celecoxib (CELEBREX) 100 MG capsule TAKE 1 CAPSULE BY MOUTH 2 TIMES DAILY (Patient taking differently: Take 200 mg by mouth every morning.)   celecoxib (CELEBREX) 200 MG capsule TAKE 1 CAPSULE BY MOUTH EVERY DAY   DULoxetine (CYMBALTA) 60 MG capsule TAKE 1 CAPSULE BY MOUTH  EVERY DAY   fexofenadine (ALLEGRA) 180 MG tablet Take 180 mg by mouth daily with supper.   metroNIDAZOLE (METROGEL) 1 % gel Apply 1 Application topically daily as needed (rosacea).   Multiple Vitamin (MULTIVITAMIN WITH MINERALS) TABS tablet Take 1 tablet by mouth daily. Centrum Women's no Iron   nitroGLYCERIN (NITROSTAT) 0.4 MG SL tablet PLACE 1 TABLET UNDER THE TONGUE EVERY 5 MINUTES AS NEEDED FOR CHEST PAIN (Patient not taking: Reported on 04/05/2023)   omeprazole (PRILOSEC) 20 MG capsule Take 40 mg by mouth every morning.   oxyCODONE-acetaminophen (PERCOCET) 5-325 MG  tablet Take 1 tablet by mouth every 4 (four) hours as needed for severe pain.   psyllium (REGULOID) 0.52 g capsule Take 0.52 g by mouth 2 (two) times daily as needed (bowel).   ramipril (ALTACE) 10 MG capsule TAKE 1 CAPSULE BY MOUTH EVERY DAY   valACYclovir (VALTREX) 1000 MG tablet TAKE 2 TABLET BY MOUTH Q12 hours X 2 DOSES WITH FEVER BLISTERS   [DISCONTINUED] clobetasol (TEMOVATE) 0.05 % external solution Apply 1 application topically daily as needed (itching).   [DISCONTINUED] estradiol (ESTRACE) 0.5 MG tablet 1/2 tab daily (Patient taking differently: Take 0.25 mg by mouth every other day.)   [DISCONTINUED] methocarbamol (ROBAXIN) 500 MG tablet Take 1 tablet (500 mg total) by mouth every 6 (six) hours as needed for muscle spasms. (Patient not taking: Reported on 04/05/2023)   [DISCONTINUED] oseltamivir (TAMIFLU) 75 MG capsule Take 1 capsule (75 mg total) by mouth daily. (Patient not taking: Reported on 04/05/2023)   No facility-administered encounter medications on file as of 04/17/2023.    Allergies (verified) Codeine and Crab [shellfish allergy]   History: Past Medical History:  Diagnosis Date   ALLERGIC RHINITIS    Allergy    Anginal pain (HCC)    Blood transfusion without reported diagnosis    Cataract    small right   Cholelithiasis 06/09/2008   gallstones 2009 on ct scan   Complication of anesthesia 2012   went to ICU after bilateral knees , unstable vital signs oct 2012, has surgery since did ok   Coronary artery disease    30 % narrowing lad   Diverticulitis    EPICONDYLITIS, LATERAL    osteoarthritis, previous joint replacements   Fall 10/2018   GERD    Hepatic hemangioma 06/09/2008   Hiatal hernia 03/2000   Hx of adenomatous polyp of colon 06/05/2008   Hx of cardiac catheterization 2008   clean coronarys DrKelly    Hx of chest pain 2003   neg cath remot hx of narrowwing lad in 2003 nl 2008, none in many years   HYPERLIPIDEMIA    IBS (irritable bowel syndrome)     LEUKOPENIA, CHRONIC    Mononucleosis 1963   RAYNAUD'S SYNDROME, HX OF    improved after cardiac meds initiated   Rosacea    facial   Sleep apnea    SLEEP APNEA, OBSTRUCTIVE    cpap, settings "automatic" settings 11-12   Subacute thyroiditis    UNSPECIFIED ANEMIA    Past Surgical History:  Procedure Laterality Date   ABDOMINAL HYSTERECTOMY  1993   bso   ANTERIOR CERVICAL DECOMP/DISCECTOMY FUSION N/A 12/20/2022   Procedure: Cervical four-five Cervical five-six Cervical six-seven Anterior Cervical Decompression Fusion;  Surgeon: Barnett Abu, MD;  Location: [facility];  Service: Neurosurgery;  Laterality: N/A;   BACK SURGERY  2008   l 3 to l4 l4 to l5   BARTHOLIN GLAND CYST EXCISION Right 09/09/2019   Procedure: EXCISION  OF VULVAR MASS TIMES TWO;  Surgeon: Jerene Bears, MD;  Location: [facility];  Service: Gynecology;  Laterality: Right;   CARDIAC CATHETERIZATION  04/07/2007   Noncritical coronary artery disease. Contiue medical therapy.   CARDIAC CATHETERIZATION  12/03/2007   Normal LV function. Mild angiographic mitral valve prolapse. Normal coronary arteries.   CARDIOVASCULAR STRESS TEST  11/09/2007   Moderate ischemia in Mid Anterior, Mid Anteroseptal, Apical Anterior, and Apical Septal regions. EKG negative for ischemia.   CESAREAN SECTION  1985, 1989   COLON RESECTION Left 01/03/2022   Procedure: COLON RESECTION;  Surgeon: Quentin Ore, MD;  Location: [facility];  Service: General;  Laterality: Left;   COLOSTOMY  01/03/2022   Procedure: COLOSTOMY;  Surgeon: Quentin Ore, MD;  Location: [facility];  Service: General;;   dental implants  11/2002,11/2011,11/2012   dental implants     x 6   DENTAL SURGERY     DILATION AND CURETTAGE OF UTERUS     d and e 1988 and 1990   JOINT REPLACEMENT  09/21/2011   bilateral knee   KNEE ARTHROSCOPY     bilateral   LAPAROTOMY N/A 01/03/2022   Procedure: EXPLORATORY LAPAROTOMY;  Surgeon: Quentin Ore, MD;  Location: [facility];  Service: General;  Laterality: N/A;   lumbar surgery fixation with disc replacement  10/08   Left   NASAL SEPTOPLASTY W/ TURBINOPLASTY  05/2000   NOCTURNAL POLYSOMNOGRAM  12/31/2006   Severe obstructive sleep apnea. AHI-87/hr   REPLACEMENT TOTAL KNEE  2012   bilateral   TONSILLECTOMY  1952   adenoids too   TOTAL HIP ARTHROPLASTY Left 10/21/2013   Procedure: LEFT TOTAL HIP ARTHROPLASTY ANTERIOR APPROACH;  Surgeon: Loanne Drilling, MD;  Location: [facility];  Service: Orthopedics;  Laterality: Left;   TOTAL HIP ARTHROPLASTY Right 07/16/2014   Procedure: RIGHT TOTAL HIP ARTHROPLASTY ANTERIOR APPROACH;  Surgeon: Loanne Drilling, MD;  Location: [facility];  Service: Orthopedics;  Laterality: Right;   TRANSTHORACIC ECHOCARDIOGRAM  11/09/2007   EF 58%, LV systolic function normal. Mild aortic root dilation.   WRIST SURGERY Left 04/03/14   ORIF "distal radial head fracture"   XI ROBOTIC ASSISTED COLOSTOMY TAKEDOWN N/A 09/26/2022   Procedure: ROBOTIC OSTOMY REVERSAL;  Surgeon: Quentin Ore, MD;  Location: [facility];  Service: General;  Laterality: N/A;   Family History  Problem Relation Age of Onset   Rheum arthritis Mother    Diabetes Mother    Heart failure Mother    Thrombocytopenia Mother    Sleep apnea Mother    Ulcers Mother        PUD   Deep vein thrombosis Father    Pulmonary embolism Father    Osteoarthritis Father    Atrial fibrillation Father    Dementia Father    Sleep apnea Father    Sleep apnea Brother    Obesity Brother    Sleep apnea Brother    Obesity Brother    Atrial fibrillation Brother    Colon cancer Maternal Aunt 59   Colon cancer Maternal Aunt 90   Congenital adrenal hyperplasia Grandchild    Pancreatic cancer Other    Uterine cancer Other    Leukemia Other    Inflammatory bowel disease Other        aunt   Esophageal cancer Neg Hx    Rectal cancer Neg Hx    Stomach cancer Neg Hx    Colon polyps Neg Hx    Social History  Socioeconomic History    Marital status: Married    Spouse name: Not on file   Number of children: 1   Years of education: Not on file   Highest education level: Professional school degree (e.g., MD, DDS, DVM, JD)  Occupational History   Occupation: Physician  Tobacco Use   Smoking status: Never   Smokeless tobacco: Never  Vaping Use   Vaping Use: Never used  Substance and Sexual Activity   Alcohol use: Yes    Alcohol/week: 7.0 standard drinks of alcohol    Types: 7 Glasses of wine per week   Drug use: No   Sexual activity: Yes    Partners: Male    Birth control/protection: Surgical    Comment: TAH/BSO  Other Topics Concern   Not on file  Social History Narrative   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   Social Determinants of Health   Financial Resource Strain: Low Risk  (04/17/2023)   Overall Financial Resource Strain (CARDIA)    Difficulty of Paying Living Expenses: Not hard at all  Food Insecurity: No Food Insecurity (04/17/2023)   Hunger Vital Sign    Worried About Running Out of Food in the Last Year: Never true    Ran Out of Food in the Last Year: Never true  Transportation Needs: No Transportation Needs (04/17/2023)   PRAPARE - Administrator, Civil Service (Medical): No    Lack of Transportation (Non-Medical): No  Physical Activity: Inactive (04/17/2023)   Exercise Vital Sign    Days of Exercise per Week: 0 days    Minutes of Exercise per Session: 0 min  Stress: No Stress Concern Present (04/17/2023)   [employer] of Occupational Health - Occupational Stress Questionnaire    Feeling of Stress : Not at all  Social Connections: Socially Integrated (04/17/2023)   Social Connection and Isolation Panel [NHANES]    Frequency of Communication with Friends and Family: More than three times a week    Frequency of Social Gatherings with Friends and Family: More than three times a week    Attends Religious  Services: More than 4 times per year    Active Member of [employer] or Organizations: Yes    Attends Engineer, structural: More than 4 times per year    Marital Status: Married    Tobacco Counseling Counseling given: Not Answered   Clinical Intake:  Pre-visit preparation completed: Yes  Pain : No/denies pain     BMI - recorded: 40.56 Nutritional Status: BMI > 30  Obese Nutritional Risks: None Diabetes: No  How often do you need to have someone help you when you read instructions, pamphlets, or other written materials from your doctor or pharmacy?: 1 - Never  Diabetic?  No  Interpreter Needed?: No  Information entered by :: Theresa Mulligan LPN   Activities of Daily Living    04/17/2023    2:06 PM 04/17/2023    2:04 PM  In your present state of health, do you have any difficulty performing the following activities:  Hearing? 0 1  Comment  Wears hearing aids  Vision? 0 0  Difficulty concentrating or making decisions? 0 0  Walking or climbing stairs? 0 0  Comment  Uses walker and cane  Dressing or bathing? 0 0  Doing errands, shopping? 0 0  Preparing Food and eating ? N N  Using the  Toilet? N N  In the past six months, have you accidently leaked urine? N N  Do you have problems with loss of bowel control? N N  Managing your Medications? N N  Managing your Finances? N N  Housekeeping or managing your Housekeeping? N N    Patient Care Team: Panosh, Neta Mends, MD as PCP - General Tresa Endo Clovis Pu, MD as PCP - Cardiology (Cardiology) Lennette Bihari, MD (Cardiology) Jerene Bears, MD as Attending Physician (Obstetrics and Gynecology) Ollen Gross, MD as Attending Physician (Orthopedic Surgery) Waymon Budge, MD (Pulmonary Disease) Pollyann Savoy, MD as Consulting Physician (Rheumatology) Elmon Else, MD as Consulting Physician (Dermatology) Nelson Chimes, MD as Consulting Physician (Ophthalmology) Bradly Bienenstock, MD as Consulting Physician (Orthopedic  Surgery) Tressia Danas, MD as Consulting Physician (Gastroenterology) Stechschulte, Hyman Hopes, MD as Consulting Physician (Surgery)  Indicate any recent Medical Services you may have received from other than [facility] providers in the past year (date may be approximate).     Assessment:   This is a routine wellness examination for Terri Rodriguez.  Hearing/Vision screen Hearing Screening - Comments:: Wears hearing aids Vision Screening - Comments:: Wears rx glasses - up to date with routine eye exams with  Dr Sherrine Maples  Dietary issues and exercise activities discussed: Exercise limited by: None identified   Goals Addressed               This Visit's Progress     Patient stated (pt-stated)        I want to see better.       Depression Screen    04/17/2023    2:03 PM 04/13/2022    2:49 PM 12/09/2021    2:56 PM 10/07/2021   10:29 AM 04/12/2021    1:30 PM 01/18/2021    2:19 PM 09/16/2020    3:49 PM  PHQ 2/9 Scores  PHQ - 2 Score 0 0 2 2 0 0 0  PHQ- 9 Score    5       Fall Risk    04/17/2023    2:06 PM 04/12/2023   12:25 PM 04/13/2022    2:53 PM 04/13/2022   12:26 PM 12/27/2021    5:19 PM  Fall Risk   Falls in the past year? 0 0 1 1 1   Number falls in past yr: 0  0 0 0  Injury with Fall? 0  0 0 0  Comment   No injury or medical attention needed    Risk for fall due to : No Fall Risks  No Fall Risks    Follow up Falls prevention discussed        FALL RISK PREVENTION PERTAINING TO THE HOME:  Any stairs in or around the home? Yes  If so, are there any without handrails? No  Home free of loose throw rugs in walkways, pet beds, electrical cords, etc? Yes  Adequate lighting in your home to reduce risk of falls? Yes   ASSISTIVE DEVICES UTILIZED TO PREVENT FALLS:  Life alert? No  Use of a cane, walker or w/c? Yes  Grab bars in the bathroom? Yes Shower chair or bench in shower? Yes Elevated toilet seat or a handicapped toilet? Yes  TIMED UP AND GO:  Was the test performed? No . Audio  Visit  Cognitive Function:        04/17/2023    2:08 PM 04/13/2022    2:55 PM  6CIT Screen  What Year? 0 points 0 points  What month?  0 points 0 points  What time? 0 points 0 points  Count back from 20 0 points 0 points  Months in reverse 0 points 0 points  Repeat phrase 0 points 0 points  Total Score 0 points 0 points    Immunizations Immunization History  Administered Date(s) Administered   COVID-19, mRNA, vaccine(Comirnaty)12 years and older 09/20/2022   Fluad Quad(high Dose 65+) 09/02/2019, 09/16/2020, 09/20/2022   Influenza Nasal 09/16/2020   Influenza Split 09/05/2011, 09/19/2012, 09/19/2017   Influenza Whole 09/09/2009, 10/12/2010   Influenza, High Dose Seasonal PF 09/08/2014, 10/01/2015, 09/07/2018   Influenza,inj,Quad PF,6+ Mos 09/06/2013, 07/29/2016   Influenza,inj,quad, With Preservative 09/19/2017, 08/06/2019   Influenza-Unspecified 08/24/2021   PFIZER(Purple Top)SARS-COV-2 Vaccination 12/27/2019, 01/17/2020, 08/31/2020, 03/18/2021   Pfizer Covid-19 Vaccine Bivalent Booster 50yrs & up 08/24/2021   Pneumococcal Conjugate-13 01/10/2014   Pneumococcal Polysaccharide-23 04/26/2010   Rsv, Bivalent, Protein Subunit Rsvpref,pf Verdis Frederickson) 09/20/2022   Td 12/05/2001, 01/10/2014   Zoster, Live 09/19/2012    TDAP status: Up to date  Flu Vaccine status: Up to date  Pneumococcal vaccine status: Up to date  Covid-19 vaccine status: Completed vaccines  Qualifies for Shingles Vaccine? Yes   Zostavax completed No   Shingrix Completed?: No.    Education has been provided regarding the importance of this vaccine. Patient has been advised to call insurance company to determine out of pocket expense if they have not yet received this vaccine. Advised may also receive vaccine at local pharmacy or Health Dept. Verbalized acceptance and understanding.  Screening Tests Health Maintenance  Topic Date Due   OPHTHALMOLOGY EXAM  04/17/2023 (Originally 12/12/2018)   Diabetic kidney  evaluation - Urine ACR  04/18/2023 (Originally 01/30/1964)   COVID-19 Vaccine (7 - 2023-24 season) 05/03/2023 (Originally 11/15/2022)   Zoster Vaccines- Shingrix (1 of 2) 07/18/2023 (Originally 01/31/1965)   INFLUENZA VACCINE  07/06/2023   DTaP/Tdap/Td (3 - Tdap) 01/11/2024   Diabetic kidney evaluation - eGFR measurement  04/09/2024   Medicare Annual Wellness (AWV)  04/16/2024   Pneumonia Vaccine 32+ Years old  Completed   DEXA SCAN  Completed   Hepatitis C Screening  Completed   HPV VACCINES  Aged Out   COLONOSCOPY (Pts 45-63yrs Insurance coverage will need to be confirmed)  Discontinued    Health Maintenance  There are no preventive care reminders to display for this patient.   Colorectal cancer screening: No longer required.   Mammogram status: No longer required due to Age.  Bone Density status: Completed 09/04/19. Results reflect: Bone density results: OSTEOPOROSIS. Repeat every   years.  Lung Cancer Screening: (Low Dose CT Chest recommended if Age 82-80 years, 30 pack-year currently smoking OR have quit w/in 15years.) does not qualify.     Additional Screening:  Hepatitis C Screening: does qualify; Completed 04/13/21  Vision Screening: Recommended annual ophthalmology exams for early detection of glaucoma and other disorders of the eye. Is the patient up to date with their annual eye exam?  Yes  Who is the provider or what is the name of the office in which the patient attends annual eye exams? Dr Sherrine Maples If pt is not established with a provider, would they like to be referred to a provider to establish care? No .   Dental Screening: Recommended annual dental exams for proper oral hygiene  Community Resource Referral / Chronic Care Management:  CRR required this visit?  No   CCM required this visit?  No      Plan:     I have personally  reviewed and noted the following in the patient's chart:   Medical and social history Use of alcohol, tobacco or illicit drugs   Current medications and supplements including opioid prescriptions. Patient is not currently taking opioid prescriptions. Functional ability and status Nutritional status Physical activity Advanced directives List of other physicians Hospitalizations, surgeries, and ER visits in previous 12 months Vitals Screenings to include cognitive, depression, and falls Referrals and appointments  In addition, I have reviewed and discussed with patient certain preventive protocols, quality metrics, and best practice recommendations. A written personalized care plan for preventive services as well as general preventive health recommendations were provided to patient.     Terri Rung, LPN   03/13/8118   Nurse Notes: Patient due Diabetic kidney evaluation-Urine ACR

## 2023-04-18 ENCOUNTER — Encounter: Payer: Self-pay | Admitting: Adult Health

## 2023-04-18 ENCOUNTER — Ambulatory Visit: Payer: BLUE CROSS/BLUE SHIELD | Admitting: Internal Medicine

## 2023-04-18 ENCOUNTER — Ambulatory Visit (INDEPENDENT_AMBULATORY_CARE_PROVIDER_SITE_OTHER): Payer: Medicare Other | Admitting: Adult Health

## 2023-04-18 VITALS — BP 130/64 | HR 107 | Ht 64.0 in | Wt 249.0 lb

## 2023-04-18 DIAGNOSIS — G4733 Obstructive sleep apnea (adult) (pediatric): Secondary | ICD-10-CM | POA: Diagnosis not present

## 2023-04-18 NOTE — Assessment & Plan Note (Signed)
Healthy weight loss discussed 

## 2023-04-18 NOTE — Patient Instructions (Signed)
Continue on CPap at bedtime. Keep up the good work. Work on healthy weight loss Do not drive if sleepy Follow with Dr. Maple Hudson in 1 year and as needed

## 2023-04-18 NOTE — Progress Notes (Signed)
@Patient  ID: Terri Rodriguez, female    DOB: 1946/09/29, 77 y.o.   MRN: [id number]  Chief Complaint  Patient presents with   Follow-up    Referring provider: Madelin Headings, MD  HPI: 77 yo female never smoker followed for OSA Retired MD   TEST/EVENTS :   04/18/2023 Follow up ; OSA  Patient returns for 71-month follow-up.  Patient has underlying sleep apnea.  She is on nocturnal CPAP.  Patient says she is doing well on CPAP.  Cannot sleep without it.  Feels that she benefits from CPAP with decreased daytime sleepiness.  CPAP download shows excellent compliance with 100% usage.  Daily average usage around 10.5 hours.  Patient is on auto CPAP 4 to 18 cm H2O.  AHI 4.6/hour. Feels pressure is comfortable. Uses nasal pillows. Uses [employer] [facility]. Got new CPAP machine.   Critical illness in early 2023 for acute ischemic colitis /diverticultis complicated by septic shock, long recovery . Required colostomy , s/p reversal.   Allergies  Allergen Reactions   Codeine Nausea Only   Crab [Shellfish Allergy] Itching and Nausea And Vomiting    Palm and sole of feet itch- stone crab  Per pt- betadine ok    Immunization History  Administered Date(s) Administered   COVID-19, mRNA, vaccine(Comirnaty)12 years and older 09/20/2022   Fluad Quad(high Dose 65+) 09/02/2019, 09/16/2020, 09/20/2022   Influenza Nasal 09/16/2020   Influenza Split 09/05/2011, 09/19/2012, 09/19/2017   Influenza Whole 09/09/2009, 10/12/2010   Influenza, High Dose Seasonal PF 09/08/2014, 10/01/2015, 09/07/2018   Influenza,inj,Quad PF,6+ Mos 09/06/2013, 07/29/2016   Influenza,inj,quad, With Preservative 09/19/2017, 08/06/2019   Influenza-Unspecified 08/24/2021   PFIZER(Purple Top)SARS-COV-2 Vaccination 12/27/2019, 01/17/2020, 08/31/2020, 03/18/2021   Pfizer Covid-19 Vaccine Bivalent Booster 17yrs & up 08/24/2021   Pneumococcal Conjugate-13 01/10/2014   Pneumococcal Polysaccharide-23 04/26/2010   Rsv, Bivalent, Protein Subunit  Rsvpref,pf Verdis Frederickson) 09/20/2022   Td 12/05/2001, 01/10/2014   Zoster, Live 09/19/2012    Past Medical History:  Diagnosis Date   ALLERGIC RHINITIS    Allergy    Anginal pain (HCC)    Blood transfusion without reported diagnosis    Cataract    small right   Cholelithiasis 06/09/2008   gallstones 2009 on ct scan   Complication of anesthesia 2012   went to ICU after bilateral knees , unstable vital signs oct 2012, has surgery since did ok   Coronary artery disease    30 % narrowing lad   Diverticulitis    EPICONDYLITIS, LATERAL    osteoarthritis, previous joint replacements   Fall 10/2018   GERD    Hepatic hemangioma 06/09/2008   Hiatal hernia 03/2000   Hx of adenomatous polyp of colon 06/05/2008   Hx of cardiac catheterization 2008   clean coronarys DrKelly    Hx of chest pain 2003   neg cath remot hx of narrowwing lad in 2003 nl 2008, none in many years   HYPERLIPIDEMIA    IBS (irritable bowel syndrome)    LEUKOPENIA, CHRONIC    Mononucleosis 1963   RAYNAUD'S SYNDROME, HX OF    improved after cardiac meds initiated   Rosacea    facial   Sleep apnea    SLEEP APNEA, OBSTRUCTIVE    cpap, settings "automatic" settings 11-12   Subacute thyroiditis    UNSPECIFIED ANEMIA     Tobacco History: Social History   Tobacco Use  Smoking Status Never  Smokeless Tobacco Never   Counseling given: Not Answered   Outpatient Medications Prior to Visit  Medication Sig Dispense Refill   acetaminophen (TYLENOL) 500 MG tablet Take 1 tablet (500 mg total) by mouth every 6 (six) hours as needed for fever or mild pain. (Patient taking differently: Take 500 mg by mouth every morning.) 30 tablet 0   amLODipine (NORVASC) 5 MG tablet TAKE 1 TABLET BY MOUTH EVERY DAY (Patient taking differently: Take 5 mg by mouth every evening.) 90 tablet 3   amoxicillin (AMOXIL) 500 MG capsule Take 2,000 mg by mouth See admin instructions. Take 2000 mg by mouth 1 hour prior to dental procedures. (Patient  not taking: Reported on 04/05/2023)     aspirin EC 81 MG tablet Take 81 mg by mouth daily.     atorvastatin (LIPITOR) 20 MG tablet Take 1 tablet (20 mg total) by mouth daily. Pt. Will need an appointment in order to receive future refills. 30 tablet 0   Azelaic Acid 15 % cream Apply 1 application topically 2 (two) times daily as needed (rosacea). After skin is thoroughly washed and patted dry, gently but thoroughly massage a thin film of azelaic acid cream into the affected area t daily,after shower.     calcium elemental as carbonate (TUMS ULTRA 1000) 400 MG chewable tablet Chew 1,000 mg by mouth daily as needed for heartburn.     carvedilol (COREG) 3.125 MG tablet TAKE 1 TABLET BY MOUTH 2 TIMES DAILY WITH A MEAL 180 tablet 1   celecoxib (CELEBREX) 200 MG capsule TAKE 1 CAPSULE BY MOUTH EVERY DAY 90 capsule 0   DULoxetine (CYMBALTA) 60 MG capsule TAKE 1 CAPSULE BY MOUTH EVERY DAY 90 capsule 0   fexofenadine (ALLEGRA) 180 MG tablet Take 180 mg by mouth daily with supper.     metroNIDAZOLE (METROGEL) 1 % gel Apply 1 Application topically daily as needed (rosacea).     Multiple Vitamin (MULTIVITAMIN WITH MINERALS) TABS tablet Take 1 tablet by mouth daily. Centrum Women's no Iron     nitroGLYCERIN (NITROSTAT) 0.4 MG SL tablet PLACE 1 TABLET UNDER THE TONGUE EVERY 5 MINUTES AS NEEDED FOR CHEST PAIN (Patient not taking: Reported on 04/05/2023) 25 tablet 3   omeprazole (PRILOSEC) 20 MG capsule Take 40 mg by mouth every morning.     oxyCODONE-acetaminophen (PERCOCET) 5-325 MG tablet Take 1 tablet by mouth every 4 (four) hours as needed for severe pain. 30 tablet 0   psyllium (REGULOID) 0.52 g capsule Take 0.52 g by mouth 2 (two) times daily as needed (bowel).     ramipril (ALTACE) 10 MG capsule TAKE 1 CAPSULE BY MOUTH EVERY DAY 90 capsule 3   valACYclovir (VALTREX) 1000 MG tablet TAKE 2 TABLET BY MOUTH Q12 hours X 2 DOSES WITH FEVER BLISTERS 30 tablet 1   celecoxib (CELEBREX) 100 MG capsule TAKE 1 CAPSULE BY  MOUTH 2 TIMES DAILY (Patient taking differently: Take 200 mg by mouth every morning.) 60 capsule 3   No facility-administered medications prior to visit.     Review of Systems:   Constitutional:   No  weight loss, night sweats,  Fevers, chills, fatigue, or  lassitude.  HEENT:   No headaches,  Difficulty swallowing,  Tooth/dental problems, or  Sore throat,                No sneezing, itching, ear ache, nasal congestion, post nasal drip,   CV:  No chest pain,  Orthopnea, PND, swelling in lower extremities, anasarca, dizziness, palpitations, syncope.   GI  No heartburn, indigestion, abdominal pain, nausea, vomiting, diarrhea, change in bowel habits,  loss of appetite, bloody stools.   Resp: No shortness of breath with exertion or at rest.  No excess mucus, no productive cough,  No non-productive cough,  No coughing up of blood.  No change in color of mucus.  No wheezing.  No chest wall deformity  Skin: no rash or lesions.  GU: no dysuria, change in color of urine, no urgency or frequency.  No flank pain, no hematuria   MS:  +arthritis     Physical Exam  BP 130/64   Pulse (!) 107   Ht 5\' 4"  (1.626 m)   Wt 249 lb (112.9 kg)   SpO2 97%   BMI 42.74 kg/m   GEN: A/Ox3; pleasant , NAD, well nourished , walks with cane    HEENT:  [place]/AT,   NOSE-clear, THROAT-clear, no lesions, no postnasal drip or exudate noted. Class 2-3 MP airway   NECK:  Supple w/ fair ROM; no JVD; normal carotid impulses w/o bruits; no thyromegaly or nodules palpated; no lymphadenopathy.    RESP  Clear  P & A; w/o, wheezes/ rales/ or rhonchi. no accessory muscle use, no dullness to percussion  CARD:  RRR, no m/r/g, no peripheral edema, pulses intact, no cyanosis or clubbing.  GI:   Soft & nt; nml bowel sounds; no organomegaly or masses detected.   Musco: Warm bil, no deformities or joint swelling noted.   Neuro: alert, no focal deficits noted.    Skin: Warm, no lesions or rashes    Lab  Results:    BNP No results found for: "BNP"  ProBNP No results found for: "PROBNP"  Imaging: No results found.        No data to display          No results found for: "NITRICOXIDE"      Assessment & Plan:   Obstructive sleep apnea Excellent control and compliance  No change in setting   Plan  Patient Instructions  Continue on CPap at bedtime. Keep up the good work. Work on healthy weight loss Do not drive if sleepy Follow with Dr. Maple Hudson in 1 year and as needed    Morbid obesity Healthy weight loss discussed      Rubye Oaks, NP 04/18/2023

## 2023-04-18 NOTE — Assessment & Plan Note (Signed)
Excellent control and compliance  No change in setting   Plan  Patient Instructions  Continue on CPap at bedtime. Keep up the good work. Work on healthy weight loss Do not drive if sleepy Follow with Dr. Maple Hudson in 1 year and as needed

## 2023-04-21 ENCOUNTER — Other Ambulatory Visit: Payer: Self-pay | Admitting: Family

## 2023-04-21 ENCOUNTER — Other Ambulatory Visit: Payer: Self-pay | Admitting: Cardiovascular Disease

## 2023-04-24 DIAGNOSIS — G4733 Obstructive sleep apnea (adult) (pediatric): Secondary | ICD-10-CM | POA: Diagnosis not present

## 2023-04-25 DIAGNOSIS — H2511 Age-related nuclear cataract, right eye: Secondary | ICD-10-CM | POA: Diagnosis not present

## 2023-04-25 HISTORY — PX: CATARACT EXTRACTION: SUR2

## 2023-04-27 ENCOUNTER — Other Ambulatory Visit: Payer: Self-pay | Admitting: Cardiovascular Disease

## 2023-05-08 DIAGNOSIS — Z96659 Presence of unspecified artificial knee joint: Secondary | ICD-10-CM | POA: Diagnosis not present

## 2023-05-08 DIAGNOSIS — G4733 Obstructive sleep apnea (adult) (pediatric): Secondary | ICD-10-CM | POA: Diagnosis not present

## 2023-05-18 DIAGNOSIS — K080 Exfoliation of teeth due to systemic causes: Secondary | ICD-10-CM | POA: Diagnosis not present

## 2023-05-22 ENCOUNTER — Other Ambulatory Visit: Payer: Self-pay | Admitting: Internal Medicine

## 2023-05-24 DIAGNOSIS — G4733 Obstructive sleep apnea (adult) (pediatric): Secondary | ICD-10-CM | POA: Diagnosis not present

## 2023-05-25 DIAGNOSIS — G4733 Obstructive sleep apnea (adult) (pediatric): Secondary | ICD-10-CM | POA: Diagnosis not present

## 2023-05-31 NOTE — Progress Notes (Signed)
Chief Complaint  Patient presents with   Follow-up    Pt would like to discuss pain control, getting a physical therapy and renew handicap form.     HPI: Terri Rodriguez 77 y.o. come in for Chronic disease management  here with spouse today  fu MM issues and pain help  MS back  spine: dr Danielle Dess had advised pt and had pt [place] and referred to [facility] for water therapy which was helpful but then had medical emergency with ruptured diverticula and temp coloscopy and reanastomosis  which interfered.  Looking into techniques to manage pain . Had used an ocass oxycodone for post op bad days when has to travel function etc. ( 15 may last 6 mos.. Left sciatica persists  Chagrinned about memory since septic and abd perf admission  delayed work finding  but function ok.  GI stable now  had polyps benign? Colon told no need for fu?this form letter  family hx of colon cancer.  Mammogram dense breast told no need fu [facility] ? Screening  no sx   Needs renewal for handicap tag   forgot form today.   Chagrinned that cannot physically play  interact with grand kids .  Ability to Play piano  cog and physical  husband supportive and cooks helps with family and driving   Had lab recently  no bleeding  ? Dm ?BG ROS: See pertinent positives and negatives per HPI. Had fall recetntly  gained weight back  that had lost from  medical   Procedures :  Had  spine injection 1 2 24   Cervical spine fusion 1 24 Robotic ostomy reversal 11 23   Past Medical History:  Diagnosis Date   ALLERGIC RHINITIS    Allergy    Anginal pain (HCC)    Blood transfusion without reported diagnosis    Cataract    small right   Cholelithiasis 06/09/2008   gallstones 2009 on ct scan   Complication of anesthesia 2012   went to ICU after bilateral knees , unstable vital signs oct 2012, has surgery since did ok   Coronary artery disease    30 % narrowing lad   Diverticulitis    EPICONDYLITIS, LATERAL    osteoarthritis,  previous joint replacements   Fall 10/2018   GERD    Hepatic hemangioma 06/09/2008   Hiatal hernia 03/2000   Hx of adenomatous polyp of colon 06/05/2008   Hx of cardiac catheterization 2008   clean coronarys DrKelly    Hx of chest pain 2003   neg cath remot hx of narrowwing lad in 2003 nl 2008, none in many years   HYPERLIPIDEMIA    IBS (irritable bowel syndrome)    LEUKOPENIA, CHRONIC    Mononucleosis 1963   RAYNAUD'S SYNDROME, HX OF    improved after cardiac meds initiated   Rosacea    facial   Sleep apnea    SLEEP APNEA, OBSTRUCTIVE    cpap, settings "automatic" settings 11-12   Subacute thyroiditis    UNSPECIFIED ANEMIA     Family History  Problem Relation Age of Onset   Rheum arthritis Mother    Diabetes Mother    Heart failure Mother    Thrombocytopenia Mother    Sleep apnea Mother    Ulcers Mother        PUD   Deep vein thrombosis Father    Pulmonary embolism Father    Osteoarthritis Father    Atrial fibrillation Father    Dementia Father  Sleep apnea Father    Sleep apnea Brother    Obesity Brother    Sleep apnea Brother    Obesity Brother    Atrial fibrillation Brother    Colon cancer Maternal Aunt 59   Colon cancer Maternal Aunt 90   Congenital adrenal hyperplasia Grandchild    Pancreatic cancer Other    Uterine cancer Other    Leukemia Other    Inflammatory bowel disease Other        aunt   Esophageal cancer Neg Hx    Rectal cancer Neg Hx    Stomach cancer Neg Hx    Colon polyps Neg Hx     Social History   Socioeconomic History   Marital status: Married    Spouse name: Not on file   Number of children: 1   Years of education: Not on file   Highest education level: Professional school degree (e.g., MD, DDS, DVM, JD)  Occupational History   Occupation: Physician  Tobacco Use   Smoking status: Never   Smokeless tobacco: Never  Vaping Use   Vaping Use: Never used  Substance and Sexual Activity   Alcohol use: Yes    Alcohol/week: 7.0  standard drinks of alcohol    Types: 7 Glasses of wine per week   Drug use: No   Sexual activity: Yes    Partners: Male    Birth control/protection: Surgical    Comment: TAH/BSO  Other Topics Concern   Not on file  Social History Narrative   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   Social Determinants of Health   Financial Resource Strain: Low Risk  (05/31/2023)   Overall Financial Resource Strain (CARDIA)    Difficulty of Paying Living Expenses: Not hard at all  Food Insecurity: No Food Insecurity (05/31/2023)   Hunger Vital Sign    Worried About Running Out of Food in the Last Year: Never true    Ran Out of Food in the Last Year: Never true  Transportation Needs: No Transportation Needs (05/31/2023)   PRAPARE - Administrator, Civil Service (Medical): No    Lack of Transportation (Non-Medical): No  Physical Activity: Inactive (05/31/2023)   Exercise Vital Sign    Days of Exercise per Week: 0 days    Minutes of Exercise per Session: 0 min  Stress: No Stress Concern Present (05/31/2023)   [employer] of Occupational Health - Occupational Stress Questionnaire    Feeling of Stress : Only a little  Social Connections: Socially Integrated (05/31/2023)   Social Connection and Isolation Panel [NHANES]    Frequency of Communication with Friends and Family: Twice a week    Frequency of Social Gatherings with Friends and Family: Once a week    Attends Religious Services: 1 to 4 times per year    Active Member of [employer] or Organizations: Yes    Attends Banker Meetings: Never    Marital Status: Married    Outpatient Medications Prior to Visit  Medication Sig Dispense Refill   acetaminophen (TYLENOL) 500 MG tablet Take 1 tablet (500 mg total) by mouth every 6 (six) hours as needed for fever or mild pain. (Patient taking differently: Take 500 mg by mouth every morning.) 30 tablet 0    amLODipine (NORVASC) 5 MG tablet TAKE 1 TABLET BY MOUTH EVERY DAY (Patient taking differently: Take 5 mg by mouth every  evening.) 90 tablet 3   amoxicillin (AMOXIL) 500 MG capsule Take 2,000 mg by mouth See admin instructions. Take 2000 mg by mouth 1 hour prior to dental procedures.     aspirin EC 81 MG tablet Take 81 mg by mouth daily.     atorvastatin (LIPITOR) 20 MG tablet Take 1 tablet (20 mg total) by mouth daily. 90 tablet 0   Azelaic Acid 15 % cream Apply 1 application topically 2 (two) times daily as needed (rosacea). After skin is thoroughly washed and patted dry, gently but thoroughly massage a thin film of azelaic acid cream into the affected area t daily,after shower.     calcium elemental as carbonate (TUMS ULTRA 1000) 400 MG chewable tablet Chew 1,000 mg by mouth daily as needed for heartburn.     carvedilol (COREG) 3.125 MG tablet TAKE 1 TABLET BY MOUTH 2 TIMES DAILY WITH A MEAL 180 tablet 1   celecoxib (CELEBREX) 200 MG capsule TAKE 1 CAPSULE BY MOUTH EVERY DAY 90 capsule 0   DULoxetine (CYMBALTA) 60 MG capsule TAKE 1 CAPSULE BY MOUTH EVERY DAY 90 capsule 0   fexofenadine (ALLEGRA) 180 MG tablet Take 180 mg by mouth daily with supper.     metroNIDAZOLE (METROGEL) 1 % gel Apply 1 Application topically daily as needed (rosacea).     Multiple Vitamin (MULTIVITAMIN WITH MINERALS) TABS tablet Take 1 tablet by mouth daily. Centrum Women's no Iron     nitroGLYCERIN (NITROSTAT) 0.4 MG SL tablet PLACE 1 TABLET UNDER THE TONGUE EVERY 5 MINUTES AS NEEDED FOR CHEST PAIN 25 tablet 0   omeprazole (PRILOSEC) 20 MG capsule Take 40 mg by mouth every morning.     psyllium (REGULOID) 0.52 g capsule Take 0.52 g by mouth 2 (two) times daily as needed (bowel).     ramipril (ALTACE) 10 MG capsule TAKE 1 CAPSULE BY MOUTH EVERY DAY 90 capsule 3   valACYclovir (VALTREX) 1000 MG tablet TAKE 2 TABLET BY MOUTH Q12 hours X 2 DOSES WITH FEVER BLISTERS 30 tablet 1   oxyCODONE-acetaminophen (PERCOCET) 5-325 MG  tablet Take 1 tablet by mouth every 4 (four) hours as needed for severe pain. 30 tablet 0   No facility-administered medications prior to visit.     EXAM:  BP 106/66 (BP Location: Right Arm, Patient Position: Sitting, Cuff Size: Large)   Pulse 77   Temp 97.7 F (36.5 C) (Oral)   Ht 5\' 4"  (1.626 m)   Wt 246 lb 3.2 oz (111.7 kg)   SpO2 95%   BMI 42.26 kg/m   Body mass index is 42.26 kg/m.  GENERAL: vitals reviewed and listed above, alert, oriented, appears well hydrated and in no acute distress cane arthritis hands  verbal ocass seconds  delay in word finding   HEENT: atraumatic, conjunctiva  clear, no obvious abnormalities on inspection of external nose and ears  MS: moves all extremities can walking  arthritis in hands  PSYCH: pleasant and cooperative,verbal nl speech  Lab Results  Component Value Date   WBC 3.3 (L) 04/10/2023   HGB 12.3 04/10/2023   HCT 37.0 04/10/2023   PLT 228 04/10/2023   GLUCOSE 107 (H) 04/10/2023   CHOL 162 04/10/2023   TRIG 68 04/10/2023   HDL 85 04/10/2023   LDLCALC 64 04/10/2023   ALT 33 (H) 04/10/2023   AST 48 (H) 04/10/2023   NA 143 04/10/2023   K 5.0 04/10/2023   CL 106 04/10/2023   CREATININE 0.69 04/10/2023   BUN 18 04/10/2023  CO2 27 04/10/2023   TSH 1.58 08/04/2021   INR 1.0 04/13/2021   HGBA1C 5.3 06/01/2023   BP Readings from Last 3 Encounters:  06/01/23 106/66  04/18/23 130/64  04/05/23 132/84   Lab Results  Component Value Date   VITAMINB12 1,058 (H) 08/04/2021    ASSESSMENT AND PLAN:  Discussed the following assessment and plan:  Back pain with left-sided radiculopathy - Plan: Ambulatory referral to Physical Therapy  Medication management - Plan: Ambulatory referral to Physical Therapy  Other chronic pain - Plan: Ambulatory referral to Physical Therapy  Osteoarthritis of multiple joints, unspecified osteoarthritis type - Plan: Ambulatory referral to Physical Therapy  Hyperglycemia - Plan: POC HgB  A1c  Degenerative disc disease, lumbar status post fusion No dm although had hyperglycemia and has risk  Obesity weight back  up Agree with getting back to PT water therapy would be good  referral . Avoid reg narcotics but will order 15 for rescue with events tylenol oxycodone use ( caution with tylenol dosing)  Disc  can revisit need for colon in 5 years ( typical polyp fu guidelines)  Counseling record   review plan   45 minutes  counsel review plan and fu   Regarding pain and management  Memory and fu  Plan 3-4 mos fu  -Patient advised to return or notify health care team  if  new concerns arise.  Patient Instructions  Pt referral  for water therapy or other.   Small amount of  narcotic  as rescue.   Get me the  handicap  form I can sign .  Careful of tylenol dosing.   Neta Mends. Oakley Orban M.D.

## 2023-06-01 ENCOUNTER — Telehealth: Payer: Self-pay | Admitting: Internal Medicine

## 2023-06-01 ENCOUNTER — Ambulatory Visit (INDEPENDENT_AMBULATORY_CARE_PROVIDER_SITE_OTHER): Payer: Medicare Other | Admitting: Internal Medicine

## 2023-06-01 ENCOUNTER — Encounter: Payer: Self-pay | Admitting: Internal Medicine

## 2023-06-01 VITALS — BP 106/66 | HR 77 | Temp 97.7°F | Ht 64.0 in | Wt 246.2 lb

## 2023-06-01 DIAGNOSIS — R739 Hyperglycemia, unspecified: Secondary | ICD-10-CM | POA: Diagnosis not present

## 2023-06-01 DIAGNOSIS — M159 Polyosteoarthritis, unspecified: Secondary | ICD-10-CM | POA: Diagnosis not present

## 2023-06-01 DIAGNOSIS — G8929 Other chronic pain: Secondary | ICD-10-CM

## 2023-06-01 DIAGNOSIS — M5410 Radiculopathy, site unspecified: Secondary | ICD-10-CM | POA: Diagnosis not present

## 2023-06-01 DIAGNOSIS — M51369 Other intervertebral disc degeneration, lumbar region without mention of lumbar back pain or lower extremity pain: Secondary | ICD-10-CM

## 2023-06-01 DIAGNOSIS — Z79899 Other long term (current) drug therapy: Secondary | ICD-10-CM

## 2023-06-01 DIAGNOSIS — M5136 Other intervertebral disc degeneration, lumbar region: Secondary | ICD-10-CM

## 2023-06-01 LAB — POCT GLYCOSYLATED HEMOGLOBIN (HGB A1C): Hemoglobin A1C: 5.3 % (ref 4.0–5.6)

## 2023-06-01 MED ORDER — OXYCODONE-ACETAMINOPHEN 5-325 MG PO TABS: 1.0000 | ORAL_TABLET | Freq: Three times a day (TID) | ORAL | 0 refills | Status: AC | PRN

## 2023-06-01 NOTE — Patient Instructions (Signed)
Pt referral  for water therapy or other.   Small amount of  narcotic  as rescue.   Get me the  handicap  form I can sign .  Careful of tylenol dosing.

## 2023-06-01 NOTE — Telephone Encounter (Signed)
Completed and signed on your desk

## 2023-06-01 NOTE — Telephone Encounter (Signed)
Attempted to reach pt. Left a detail message that form is ready to be pick up at the front desk. Call back if have any questions.

## 2023-06-01 NOTE — Telephone Encounter (Signed)
Patient dropped off document Handicap Placard, to be filled out by provider. Patient requested to send it back via Call Patient to pick up within 7-days. Document is located in providers tray at front office.Please advise at Mobile [phone] (mobile)

## 2023-06-02 ENCOUNTER — Other Ambulatory Visit: Payer: Self-pay

## 2023-06-07 DIAGNOSIS — Z96659 Presence of unspecified artificial knee joint: Secondary | ICD-10-CM | POA: Diagnosis not present

## 2023-06-07 DIAGNOSIS — G4733 Obstructive sleep apnea (adult) (pediatric): Secondary | ICD-10-CM | POA: Diagnosis not present

## 2023-06-09 ENCOUNTER — Other Ambulatory Visit: Payer: Self-pay

## 2023-06-09 MED ORDER — ATORVASTATIN CALCIUM 20 MG PO TABS: 20.0000 mg | ORAL_TABLET | Freq: Every day | ORAL | 3 refills | Status: DC

## 2023-06-13 ENCOUNTER — Other Ambulatory Visit: Payer: Self-pay | Admitting: Family

## 2023-06-13 ENCOUNTER — Other Ambulatory Visit: Payer: Self-pay | Admitting: Cardiovascular Disease

## 2023-06-14 MED ORDER — CELECOXIB 200 MG PO CAPS: 200.0000 mg | ORAL_CAPSULE | Freq: Every day | ORAL | 0 refills | Status: DC

## 2023-06-14 NOTE — Therapy (Signed)
OUTPATIENT PHYSICAL THERAPY THORACOLUMBAR EVALUATION   Patient Name: KYMBERLYN ECKFORD MRN: [id number] DOB:Feb 05, 1946, 77 y.o., female Today's Date: 06/15/2023  END OF SESSION:  PT End of Session - 06/15/23 1450     Visit Number 1    Date for PT Re-Evaluation 09/07/23    Authorization Type [employer] MCR    Progress Note Due on Visit 10    PT Start Time 1450    PT Stop Time 1529    PT Time Calculation (min) 39 min    Activity Tolerance Patient tolerated treatment well    Behavior During Therapy WFL for tasks assessed/performed             Past Medical History:  Diagnosis Date   ALLERGIC RHINITIS    Allergy    Anginal pain (HCC)    Blood transfusion without reported diagnosis    Cataract    small right   Cholelithiasis 06/09/2008   gallstones 2009 on ct scan   Complication of anesthesia 2012   went to ICU after bilateral knees , unstable vital signs oct 2012, has surgery since did ok   Coronary artery disease    30 % narrowing lad   Diverticulitis    EPICONDYLITIS, LATERAL    osteoarthritis, previous joint replacements   Fall 10/2018   GERD    Hepatic hemangioma 06/09/2008   Hiatal hernia 03/2000   Hx of adenomatous polyp of colon 06/05/2008   Hx of cardiac catheterization 2008   clean coronarys DrKelly    Hx of chest pain 2003   neg cath remot hx of narrowwing lad in 2003 nl 2008, none in many years   HYPERLIPIDEMIA    IBS (irritable bowel syndrome)    LEUKOPENIA, CHRONIC    Mononucleosis 1963   RAYNAUD'S SYNDROME, HX OF    improved after cardiac meds initiated   Rosacea    facial   Sleep apnea    SLEEP APNEA, OBSTRUCTIVE    cpap, settings "automatic" settings 11-12   Subacute thyroiditis    UNSPECIFIED ANEMIA    Past Surgical History:  Procedure Laterality Date   ABDOMINAL HYSTERECTOMY  1993   bso   ANTERIOR CERVICAL DECOMP/DISCECTOMY FUSION N/A 12/20/2022   Procedure: Cervical four-five Cervical five-six Cervical six-seven Anterior Cervical Decompression  Fusion;  Surgeon: Barnett Abu, MD;  Location: [facility];  Service: Neurosurgery;  Laterality: N/A;   ANTERIOR FUSION CERVICAL SPINE N/A 12/20/2022   BACK SURGERY  2008   l 3 to l4 l4 to l5   BARTHOLIN GLAND CYST EXCISION Right 09/09/2019   Procedure: EXCISION OF VULVAR MASS TIMES TWO;  Surgeon: Jerene Bears, MD;  Location: [facility];  Service: Gynecology;  Laterality: Right;   CARDIAC CATHETERIZATION  04/07/2007   Noncritical coronary artery disease. Contiue medical therapy.   CARDIAC CATHETERIZATION  12/03/2007   Normal LV function. Mild angiographic mitral valve prolapse. Normal coronary arteries.   CARDIOVASCULAR STRESS TEST  11/09/2007   Moderate ischemia in Mid Anterior, Mid Anteroseptal, Apical Anterior, and Apical Septal regions. EKG negative for ischemia.   CATARACT EXTRACTION Right 04/25/2023   CESAREAN SECTION  1985, 1989   COLON RESECTION Left 01/03/2022   Procedure: COLON RESECTION;  Surgeon: Quentin Ore, MD;  Location: [facility];  Service: General;  Laterality: Left;   COLOSTOMY  01/03/2022   Procedure: COLOSTOMY;  Surgeon: Quentin Ore, MD;  Location: [facility];  Service: General;;   dental implants  11/2002,11/2011,11/2012   dental implants  x 6   DENTAL SURGERY     DILATION AND CURETTAGE OF UTERUS     d and e 1988 and 1990   JOINT REPLACEMENT  09/21/2011   bilateral knee   KNEE ARTHROSCOPY     bilateral   LAPAROTOMY N/A 01/03/2022   Procedure: EXPLORATORY LAPAROTOMY;  Surgeon: Quentin Ore, MD;  Location: [facility];  Service: General;  Laterality: N/A;   lumbar surgery fixation with disc replacement  09/2007   Left   NASAL SEPTOPLASTY W/ TURBINOPLASTY  05/2000   NOCTURNAL POLYSOMNOGRAM  12/31/2006   Severe obstructive sleep apnea. AHI-87/hr   REPLACEMENT TOTAL KNEE  2012   bilateral   TONSILLECTOMY  1952   adenoids too   TOTAL HIP ARTHROPLASTY Left 10/21/2013   Procedure: LEFT TOTAL HIP ARTHROPLASTY ANTERIOR APPROACH;   Surgeon: Loanne Drilling, MD;  Location: [facility];  Service: Orthopedics;  Laterality: Left;   TOTAL HIP ARTHROPLASTY Right 07/16/2014   Procedure: RIGHT TOTAL HIP ARTHROPLASTY ANTERIOR APPROACH;  Surgeon: Loanne Drilling, MD;  Location: [facility];  Service: Orthopedics;  Laterality: Right;   TRANSTHORACIC ECHOCARDIOGRAM  11/09/2007   EF 58%, LV systolic function normal. Mild aortic root dilation.   WRIST SURGERY Left 04/03/2014   ORIF "distal radial head fracture"   XI ROBOTIC ASSISTED COLOSTOMY TAKEDOWN N/A 09/26/2022   Procedure: ROBOTIC OSTOMY REVERSAL;  Surgeon: Quentin Ore, MD;  Location: [facility];  Service: General;  Laterality: N/A;   Patient Active Problem List   Diagnosis Date Noted   Cervical myelopathy (HCC) 12/20/2022   History of creation of ostomy (HCC) 09/26/2022   Colostomy in place (HCC) 01/09/2022   IBS (irritable bowel syndrome) 01/05/2022   Physical deconditioning 01/05/2022   Perforation of colon s/p Hartmann colectomy/colostomy 01/03/2022 01/02/2022   Lumbar post-laminectomy syndrome 10/07/2021   Cervical spondylosis without myelopathy 10/07/2021   Primary osteoarthritis of both feet 06/18/2017   Status post bilateral total hip replacement 06/18/2017   Status post total knee replacement, bilateral 06/18/2017   Degenerative disc disease, lumbar status post fusion 06/18/2017   Degenerative disc disease, cervical 06/18/2017   History of cholelithiasis 06/18/2017   Other chronic pain 02/01/2017   Visit for preventive health examination 01/21/2015   Medicare annual wellness visit, subsequent 01/21/2015   BMI 40.0-44.9, adult (HCC) 01/21/2015   Hyperlipidemia LDL goal <70 01/12/2015   Morbid obesity (HCC) 01/12/2015   Hyperglycemia 01/10/2014   Mild CAD 01/06/2014   Medication monitoring encounter 09/22/2012   Hx of cardiac catheterization    Adenomatous polyp of colon 06/14/2012   ROSACEA 04/26/2010   Obstructive sleep apnea 09/09/2009   Normocytic anemia  12/15/2008   LEUKOPENIA, CHRONIC 01/08/2008   Elevated lipids 08/17/2007   ALLERGIC RHINITIS 08/17/2007   GERD 08/17/2007   Primary osteoarthritis of both hands 08/17/2007    PCP: Madelin Headings, MD   REFERRING PROVIDER: Madelin Headings, MD   REFERRING DIAG: M54.10 (ICD-10-CM) - Back pain with left-sided radiculopathy  G89.29 (ICD-10-CM) - Other chronic pain M15.9 (ICD-10-CM) - Osteoarthritis of multiple joints, unspecified osteoarthritis type   Rationale for Evaluation and Treatment: Rehabilitation  THERAPY DIAG:  Muscle weakness (generalized)  Other abnormalities of gait and mobility  Other low back pain  ONSET DATE: 01/03/23  SUBJECTIVE:  SUBJECTIVE STATEMENT: 01/03/22 became deathly ill. 1  PERTINENT HISTORY:  ACDF 12/20/22, Long history of medical and surgical diagnoses. Memory issues, LBP since surgery 2008, fibromyalgia, B hip and knee replacements, R achilles tear 2022  PAIN:  Are you having pain? Yes: NPRS scale: 4/10 Pain location: LBP some sciatica B Pain description: ache Aggravating factors: sitting, bending Relieving factors: flexion  PRECAUTIONS: Other: ACDF 12/20/22  RED FLAGS: None   WEIGHT BEARING RESTRICTIONS: No  FALLS:  Has patient fallen in last 6 months? Yes. Number of falls fell in Dr.'s office  3 months ago  LIVING ENVIRONMENT: Lives with: lives with their spouse Lives in: House/apartment Stairs: Yes: Internal: 16 steps; can reach both and External: 3-9 steps; can reach both Has following equipment at home: Single point cane, Walker - 2 wheeled, and Environmental consultant - 4 wheeled  OCCUPATION: retired  PLOF: Independent with household mobility with device  PATIENT GOALS: Would like to move around without grunting  NEXT MD VISIT: 6-12 months  OBJECTIVE:    DIAGNOSTIC FINDINGS:  N/A   COGNITION: Overall cognitive status:  reports memory issues      SENSATION: WFL  MUSCLE LENGTH:   POSTURE: rounded shoulders, forward head, and flexed trunk   PALPATION: TBD  LUMBAR ROM: *pain  AROM eval  Flexion To knees *  Extension Neutral *  Right lateral flexion 25%  Left lateral flexion 25%  Right rotation 75%  Left rotation 75%   (Blank rows = not tested)  LOWER EXTREMITY ROM:   ER limited B - unable to place foot on opp knee  Active  Right eval Left eval  Hip flexion    Hip extension    Hip abduction    Hip adduction    Hip internal rotation    Hip external rotation    Knee flexion    Knee extension    Ankle dorsiflexion    Ankle plantarflexion    Ankle inversion    Ankle eversion     (Blank rows = not tested)  LOWER EXTREMITY MMT:    MMT Right eval Left eval  Hip flexion 5 4+  Hip extension    Hip abduction    Hip adduction    Hip internal rotation    Hip external rotation    Knee flexion 5 5  Knee extension 5 4+  Ankle dorsiflexion  5 in available range  Ankle plantarflexion    Ankle inversion    Ankle eversion     (Blank rows = not tested)  UPPER EXTREMITY MMT: Shoulder flex and ABD 4+/5, IR/ER 5/5   FUNCTIONAL TESTS:  5 times sit to stand: 31 sec Timed up and go (TUG): 15.13 SEC 3 minute walk test: 242 ft - reports increased pain in LB, R hip and L calf after Dynamic Gait Index: TBD  GAIT: Distance walked: 242 ft Assistive device utilized: Single point cane Level of assistance: Modified independence Comments: short step length B, heel strike decreases B as gait progresses  TODAY'S TREATMENT:  DATE:   06/15/23  See pt ed and HEP  PATIENT EDUCATION:  Education details: PT eval findings, anticipated POC, need for further assessment of LE flexibility, strength and  balance, and initial HEP  Person educated: Patient Education method: Explanation, Demonstration, and Handouts Education comprehension: verbalized understanding and returned demonstration  HOME EXERCISE PROGRAM: Access Code: [facility] URL: [email or web address] Date: 06/15/2023 Prepared by: Raynelle Fanning  Exercises - Sit to Stand with Arms Crossed  - 3 x daily - 3-4 x weekly - 1 sets - 10 reps - Heel Raises with Counter Support  - 1 x daily - 3-4 x weekly - 1-3 sets - 10 reps - Heel Toe Raises with Counter Support  - 1 x daily - 3-4 x weekly - 1-3 sets - 10 reps - Standing March with Counter Support  - 1 x daily - 3-4 x weekly - 1-3 sets - 10 reps - Standing Hip Abduction with Counter Support  - 1 x daily - 3-4 x weekly - 1-3 sets - 10 reps - Standing Hip Extension with Counter Support  - 1 x daily - 3-4 x weekly - 1-3 sets - 10 reps  ASSESSMENT:  CLINICAL IMPRESSION: Patient is a 77 y.o. female who was seen today for physical therapy evaluation and treatment for general deconditioning and low back pain. She also demonstrates unsteadiness with gait and has had one fall in the past 6 months. She uses a SPC in the community and uses it intermittently at home. Her TUG and 5XSTS indicate she is at risk for falls. She will benefit from skilled PT to address these deficits.    OBJECTIVE IMPAIRMENTS: Abnormal gait, decreased activity tolerance, decreased balance, decreased ROM, decreased strength, increased muscle spasms, impaired flexibility, postural dysfunction, and pain.   ACTIVITY LIMITATIONS: bending, standing, stairs, and locomotion level  PARTICIPATION LIMITATIONS: driving, shopping, and community activity  PERSONAL FACTORS: Age, Behavior pattern, Time since onset of injury/illness/exacerbation, and 3+ comorbidities: ACDF 12/20/22, Long history of medical and surgical diagnoses. Memory issues, LBP since surgery 2008, fibromyalgia, B hip and knee replacements, R achilles tear 2022   are also affecting patient's functional outcome.   REHAB POTENTIAL: Good  CLINICAL DECISION MAKING: Evolving/moderate complexity  EVALUATION COMPLEXITY: Moderate   GOALS: Goals reviewed with patient? Yes  SHORT TERM GOALS: Target date: 07/13/2023   Patient will be independent with initial HEP. Baseline:  Goal status: INITIAL  2.  Patient will demonstrate 5XSTS in < = 10 sec demonstrating improved functional strength Baseline: 15.13 sec Goal status: INITIAL   LONG TERM GOALS: Target date: 09/07/2023  Patient will be independent with advanced/ongoing HEP to improve outcomes and carryover.  Baseline:  Goal status: INITIAL  2.  Patient will be able to ambulate 600' with LRAD with good safety to access community.  Baseline: 242 feet in 3 min Goal status: INITIAL  3.  Patient will be able to step up/down curb safely with LRAD for safety with community ambulation.  Baseline:  Goal status: INITIAL   4.  Patient will demonstrate improved TUG score to <= 9 sec indicating decreased risk of falls Baseline:  Goal status: INITIAL  5.  Patient will demonstrate at least 19/24 on DGI to improve gait stability and reduce risk for falls. Baseline: TBD Goal status: INITIAL  6.  Patient will report improved general mobility by 50% or more to improve QOL. Baseline:  Goal status: INITIAL   PLAN:  PT FREQUENCY: 2x/week  PT DURATION: 12 weeks Patient will be on vacation until  the week of 07/10/23  PLANNED INTERVENTIONS: Therapeutic exercises, Therapeutic activity, Neuromuscular re-education, Balance training, Gait training, Patient/Family education, Self Care, Joint mobilization, Stair training, Aquatic Therapy, Dry Needling, Electrical stimulation, Spinal mobilization, Cryotherapy, Moist heat, and Manual therapy.  PLAN FOR NEXT SESSION: [place]: reassess 5xSTS, complete DGI, assess flexibility deficits and LE strength. Work on general strengthening for LE and core, balance, gait.   Solon Palm, PT 06/15/2023, 4:04 PM [facility] [address] [place], [place] [address] Phone # [phone] Fax [phone]

## 2023-06-14 NOTE — Telephone Encounter (Signed)
I spoke with patient. She is alternating between 200 mg and 100 mg. She will take 200 mg one day and then 100 mg the next day and then 200 mg the following day. She mentioned this was discussed during her last visit but I didn't see anything in the note.

## 2023-06-14 NOTE — Telephone Encounter (Signed)
Clarify  with patient how often she is taking taking ( since we are  limiting meds that effect kidneys and liver function )  And then can refill

## 2023-06-15 ENCOUNTER — Other Ambulatory Visit: Payer: Self-pay

## 2023-06-15 ENCOUNTER — Encounter: Payer: Self-pay | Admitting: Physical Therapy

## 2023-06-15 ENCOUNTER — Ambulatory Visit: Payer: Medicare Other | Attending: Internal Medicine | Admitting: Physical Therapy

## 2023-06-15 DIAGNOSIS — R2689 Other abnormalities of gait and mobility: Secondary | ICD-10-CM | POA: Diagnosis not present

## 2023-06-15 DIAGNOSIS — M5410 Radiculopathy, site unspecified: Secondary | ICD-10-CM | POA: Diagnosis not present

## 2023-06-15 DIAGNOSIS — G8929 Other chronic pain: Secondary | ICD-10-CM | POA: Insufficient documentation

## 2023-06-15 DIAGNOSIS — M5459 Other low back pain: Secondary | ICD-10-CM | POA: Insufficient documentation

## 2023-06-15 DIAGNOSIS — M6281 Muscle weakness (generalized): Secondary | ICD-10-CM | POA: Insufficient documentation

## 2023-06-15 DIAGNOSIS — M159 Polyosteoarthritis, unspecified: Secondary | ICD-10-CM | POA: Insufficient documentation

## 2023-06-15 DIAGNOSIS — Z79899 Other long term (current) drug therapy: Secondary | ICD-10-CM | POA: Insufficient documentation

## 2023-06-23 DIAGNOSIS — G4733 Obstructive sleep apnea (adult) (pediatric): Secondary | ICD-10-CM | POA: Diagnosis not present

## 2023-07-08 ENCOUNTER — Encounter: Payer: Self-pay | Admitting: Internal Medicine

## 2023-07-08 MED ORDER — NIRMATRELVIR/RITONAVIR (PAXLOVID)TABLET: ORAL_TABLET | ORAL | 0 refills | Status: DC

## 2023-07-08 NOTE — Telephone Encounter (Signed)
Will send paxlovid to [facility]   Disc  situation on phone yesterday . No alarm sx has risk . Day 2+ of sx  Last est gfr 89 Aware of known  risk benefit

## 2023-07-12 ENCOUNTER — Encounter (HOSPITAL_BASED_OUTPATIENT_CLINIC_OR_DEPARTMENT_OTHER): Payer: Medicare Other | Admitting: Physical Therapy

## 2023-07-14 ENCOUNTER — Ambulatory Visit: Payer: Medicare Other | Admitting: Physical Therapy

## 2023-07-19 ENCOUNTER — Ambulatory Visit (HOSPITAL_BASED_OUTPATIENT_CLINIC_OR_DEPARTMENT_OTHER): Payer: Medicare Other | Admitting: Physical Therapy

## 2023-07-19 DIAGNOSIS — K080 Exfoliation of teeth due to systemic causes: Secondary | ICD-10-CM | POA: Diagnosis not present

## 2023-07-21 ENCOUNTER — Encounter: Payer: Medicare Other | Admitting: Physical Therapy

## 2023-07-24 DIAGNOSIS — G4733 Obstructive sleep apnea (adult) (pediatric): Secondary | ICD-10-CM | POA: Diagnosis not present

## 2023-07-26 ENCOUNTER — Ambulatory Visit (HOSPITAL_BASED_OUTPATIENT_CLINIC_OR_DEPARTMENT_OTHER): Payer: Medicare Other | Admitting: Physical Therapy

## 2023-07-28 ENCOUNTER — Ambulatory Visit: Payer: Medicare Other | Attending: Internal Medicine | Admitting: Rehabilitative and Restorative Service Providers"

## 2023-07-28 ENCOUNTER — Encounter: Payer: Self-pay | Admitting: Rehabilitative and Restorative Service Providers"

## 2023-07-28 DIAGNOSIS — R2689 Other abnormalities of gait and mobility: Secondary | ICD-10-CM | POA: Insufficient documentation

## 2023-07-28 DIAGNOSIS — M6281 Muscle weakness (generalized): Secondary | ICD-10-CM | POA: Diagnosis not present

## 2023-07-28 DIAGNOSIS — M5459 Other low back pain: Secondary | ICD-10-CM | POA: Insufficient documentation

## 2023-07-28 NOTE — Therapy (Signed)
OUTPATIENT PHYSICAL THERAPY TREATMENT NOTE   Patient Name: Terri Rodriguez MRN: [id number] DOB:04/27/1946, 77 y.o., female Today's Date: 07/28/2023  END OF SESSION:  PT End of Session - 07/28/23 1102     Visit Number 2    Date for PT Re-Evaluation 09/07/23    Authorization Type BCBS MCR    Progress Note Due on Visit 10    PT Start Time 1100    PT Stop Time 1140    PT Time Calculation (min) 40 min    Activity Tolerance Patient tolerated treatment well    Behavior During Therapy WFL for tasks assessed/performed             Past Medical History:  Diagnosis Date   ALLERGIC RHINITIS    Allergy    Anginal pain (HCC)    Blood transfusion without reported diagnosis    Cataract    small right   Cholelithiasis 06/09/2008   gallstones 2009 on ct scan   Complication of anesthesia 2012   went to ICU after bilateral knees , unstable vital signs oct 2012, has surgery since did ok   Coronary artery disease    30 % narrowing lad   Diverticulitis    EPICONDYLITIS, LATERAL    osteoarthritis, previous joint replacements   Fall 10/2018   GERD    Hepatic hemangioma 06/09/2008   Hiatal hernia 03/2000   Hx of adenomatous polyp of colon 06/05/2008   Hx of cardiac catheterization 2008   clean coronarys DrKelly    Hx of chest pain 2003   neg cath remot hx of narrowwing lad in 2003 nl 2008, none in many years   HYPERLIPIDEMIA    IBS (irritable bowel syndrome)    LEUKOPENIA, CHRONIC    Mononucleosis 1963   RAYNAUD'S SYNDROME, HX OF    improved after cardiac meds initiated   Rosacea    facial   Sleep apnea    SLEEP APNEA, OBSTRUCTIVE    cpap, settings "automatic" settings 11-12   Subacute thyroiditis    UNSPECIFIED ANEMIA    Past Surgical History:  Procedure Laterality Date   ABDOMINAL HYSTERECTOMY  1993   bso   ANTERIOR CERVICAL DECOMP/DISCECTOMY FUSION N/A 12/20/2022   Procedure: Cervical four-five Cervical five-six Cervical six-seven Anterior Cervical Decompression Fusion;   Surgeon: Barnett Abu, MD;  Location: [facility];  Service: Neurosurgery;  Laterality: N/A;   ANTERIOR FUSION CERVICAL SPINE N/A 12/20/2022   BACK SURGERY  2008   l 3 to l4 l4 to l5   BARTHOLIN GLAND CYST EXCISION Right 09/09/2019   Procedure: EXCISION OF VULVAR MASS TIMES TWO;  Surgeon: Jerene Bears, MD;  Location: [facility];  Service: Gynecology;  Laterality: Right;   CARDIAC CATHETERIZATION  04/07/2007   Noncritical coronary artery disease. Contiue medical therapy.   CARDIAC CATHETERIZATION  12/03/2007   Normal LV function. Mild angiographic mitral valve prolapse. Normal coronary arteries.   CARDIOVASCULAR STRESS TEST  11/09/2007   Moderate ischemia in Mid Anterior, Mid Anteroseptal, Apical Anterior, and Apical Septal regions. EKG negative for ischemia.   CATARACT EXTRACTION Right 04/25/2023   CESAREAN SECTION  1985, 1989   COLON RESECTION Left 01/03/2022   Procedure: COLON RESECTION;  Surgeon: Quentin Ore, MD;  Location: [facility];  Service: General;  Laterality: Left;   COLOSTOMY  01/03/2022   Procedure: COLOSTOMY;  Surgeon: Quentin Ore, MD;  Location: [facility];  Service: General;;   dental implants  11/2002,11/2011,11/2012   dental implants  x 6   DENTAL SURGERY     DILATION AND CURETTAGE OF UTERUS     d and e 1988 and 1990   JOINT REPLACEMENT  09/21/2011   bilateral knee   KNEE ARTHROSCOPY     bilateral   LAPAROTOMY N/A 01/03/2022   Procedure: EXPLORATORY LAPAROTOMY;  Surgeon: Quentin Ore, MD;  Location: [facility];  Service: General;  Laterality: N/A;   lumbar surgery fixation with disc replacement  09/2007   Left   NASAL SEPTOPLASTY W/ TURBINOPLASTY  05/2000   NOCTURNAL POLYSOMNOGRAM  12/31/2006   Severe obstructive sleep apnea. AHI-87/hr   REPLACEMENT TOTAL KNEE  2012   bilateral   TONSILLECTOMY  1952   adenoids too   TOTAL HIP ARTHROPLASTY Left 10/21/2013   Procedure: LEFT TOTAL HIP ARTHROPLASTY ANTERIOR APPROACH;  Surgeon: Loanne Drilling, MD;  Location: [facility];  Service: Orthopedics;  Laterality: Left;   TOTAL HIP ARTHROPLASTY Right 07/16/2014   Procedure: RIGHT TOTAL HIP ARTHROPLASTY ANTERIOR APPROACH;  Surgeon: Loanne Drilling, MD;  Location: [facility];  Service: Orthopedics;  Laterality: Right;   TRANSTHORACIC ECHOCARDIOGRAM  11/09/2007   EF 58%, LV systolic function normal. Mild aortic root dilation.   WRIST SURGERY Left 04/03/2014   ORIF "distal radial head fracture"   XI ROBOTIC ASSISTED COLOSTOMY TAKEDOWN N/A 09/26/2022   Procedure: ROBOTIC OSTOMY REVERSAL;  Surgeon: Quentin Ore, MD;  Location: [facility];  Service: General;  Laterality: N/A;   Patient Active Problem List   Diagnosis Date Noted   Cervical myelopathy (HCC) 12/20/2022   History of creation of ostomy (HCC) 09/26/2022   Colostomy in place (HCC) 01/09/2022   IBS (irritable bowel syndrome) 01/05/2022   Physical deconditioning 01/05/2022   Perforation of colon s/p Hartmann colectomy/colostomy 01/03/2022 01/02/2022   Lumbar post-laminectomy syndrome 10/07/2021   Cervical spondylosis without myelopathy 10/07/2021   Primary osteoarthritis of both feet 06/18/2017   Status post bilateral total hip replacement 06/18/2017   Status post total knee replacement, bilateral 06/18/2017   Degenerative disc disease, lumbar status post fusion 06/18/2017   Degenerative disc disease, cervical 06/18/2017   History of cholelithiasis 06/18/2017   Other chronic pain 02/01/2017   Visit for preventive health examination 01/21/2015   Medicare annual wellness visit, subsequent 01/21/2015   BMI 40.0-44.9, adult (HCC) 01/21/2015   Hyperlipidemia LDL goal <70 01/12/2015   Morbid obesity (HCC) 01/12/2015   Hyperglycemia 01/10/2014   Mild CAD 01/06/2014   Medication monitoring encounter 09/22/2012   Hx of cardiac catheterization    Adenomatous polyp of colon 06/14/2012   ROSACEA 04/26/2010   Obstructive sleep apnea 09/09/2009   Normocytic anemia 12/15/2008    LEUKOPENIA, CHRONIC 01/08/2008   Elevated lipids 08/17/2007   ALLERGIC RHINITIS 08/17/2007   GERD 08/17/2007   Primary osteoarthritis of both hands 08/17/2007    PCP: Madelin Headings, MD   REFERRING PROVIDER: Madelin Headings, MD   REFERRING DIAG: M54.10 (ICD-10-CM) - Back pain with left-sided radiculopathy  G89.29 (ICD-10-CM) - Other chronic pain M15.9 (ICD-10-CM) - Osteoarthritis of multiple joints, unspecified osteoarthritis type   Rationale for Evaluation and Treatment: Rehabilitation  THERAPY DIAG:  Muscle weakness (generalized)  Other abnormalities of gait and mobility  Other low back pain  ONSET DATE: 01/03/23  SUBJECTIVE:  SUBJECTIVE STATEMENT: Pt reports that since she had her evaluation, she has fallen twice.  Pt missed multiple visits due to covid and not feeling well.  One of the falls being at the beach when she tripped on a rug and landed on her face.  PERTINENT HISTORY:  ACDF 12/20/22, Long history of medical and surgical diagnoses. Memory issues, LBP since surgery 2008, fibromyalgia, B hip and knee replacements, R achilles tear 2022  PAIN:  Are you having pain? Yes: NPRS scale: 5-8/10 Pain location: LBP some sciatica B Pain description: ache Aggravating factors: sitting, bending Relieving factors: flexion  PRECAUTIONS: Other: ACDF 12/20/22  RED FLAGS: None   WEIGHT BEARING RESTRICTIONS: No  FALLS:  Has patient fallen in last 6 months? Yes. Number of falls fell in Dr.'s office  3 months ago  LIVING ENVIRONMENT: Lives with: lives with their spouse Lives in: House/apartment Stairs: Yes: Internal: 16 steps; can reach both and External: 3-9 steps; can reach both Has following equipment at home: Single point cane, Walker - 2 wheeled, and Environmental consultant - 4 wheeled  OCCUPATION:  retired  PLOF: Independent with household mobility with device  PATIENT GOALS: Would like to move around without grunting  NEXT MD VISIT: 6-12 months  OBJECTIVE:   DIAGNOSTIC FINDINGS:  N/A   COGNITION: Overall cognitive status:  reports memory issues      SENSATION: WFL  MUSCLE LENGTH:   POSTURE: rounded shoulders, forward head, and flexed trunk   PALPATION: TBD  LUMBAR ROM: *pain  AROM eval  Flexion To knees *  Extension Neutral *  Right lateral flexion 25%  Left lateral flexion 25%  Right rotation 75%  Left rotation 75%   (Blank rows = not tested)  LOWER EXTREMITY ROM:   ER limited B - unable to place foot on opp knee  Active  Right eval Left eval  Hip flexion    Hip extension    Hip abduction    Hip adduction    Hip internal rotation    Hip external rotation    Knee flexion    Knee extension    Ankle dorsiflexion    Ankle plantarflexion    Ankle inversion    Ankle eversion     (Blank rows = not tested)  LOWER EXTREMITY MMT:    MMT Right eval Left eval  Hip flexion 5 4+  Hip extension    Hip abduction    Hip adduction    Hip internal rotation    Hip external rotation    Knee flexion 5 5  Knee extension 5 4+  Ankle dorsiflexion  5 in available range  Ankle plantarflexion    Ankle inversion    Ankle eversion     (Blank rows = not tested)  UPPER EXTREMITY MMT: Shoulder flex and ABD 4+/5, IR/ER 5/5   FUNCTIONAL TESTS:  Eval: 5 times sit to stand: 31 sec Timed up and go (TUG): 15.13 SEC 3 minute walk test: 242 ft - reports increased pain in LB, R hip and L calf after Dynamic Gait Index: TBD  07/28/2023: 5 times sit to stand:  16.39 sec Timed up and go (TUG):  13.47 sec without assistive device 3 minute walk test:  3 minute ambulation: 259 ft with increased back and hip pain as a result (HR of 92 bpm and 97% O2 sats following)  GAIT: Distance walked: 242 ft Assistive device utilized: Single point cane Level of assistance:  Modified independence Comments: short step length B, heel strike decreases B  as gait progresses  TODAY'S TREATMENT:                                                                                                                              DATE:   07/28/2023: 5 times sit to stand and TUG Nustep level 3 x6 min with PT present to discuss status (HR of 81 bpm and 97% O2 following) 3 minute ambulation: 259 ft with increased back and hip pain as a result (HR of 92 bpm and 97% O2 sats following) Seated:  heel/toe raises, marching, LAQ.   Seated hip adduction with ball squeeze 2x10 Seated hip abduction with red tband 2x10 Seated hamstring curl with red tband 2x10 bilat   06/15/23  See pt ed and HEP  PATIENT EDUCATION:  Education details: PT eval findings, anticipated POC, need for further assessment of LE flexibility, strength and balance, and initial HEP  Person educated: Patient Education method: Explanation, Demonstration, and Handouts Education comprehension: verbalized understanding and returned demonstration  HOME EXERCISE PROGRAM: Access Code: [facility] URL: [email or web address] Date: 06/15/2023 Prepared by: Raynelle Fanning  Exercises - Sit to Stand with Arms Crossed  - 3 x daily - 3-4 x weekly - 1 sets - 10 reps - Heel Raises with Counter Support  - 1 x daily - 3-4 x weekly - 1-3 sets - 10 reps - Heel Toe Raises with Counter Support  - 1 x daily - 3-4 x weekly - 1-3 sets - 10 reps - Standing March with Counter Support  - 1 x daily - 3-4 x weekly - 1-3 sets - 10 reps - Standing Hip Abduction with Counter Support  - 1 x daily - 3-4 x weekly - 1-3 sets - 10 reps - Standing Hip Extension with Counter Support  - 1 x daily - 3-4 x weekly - 1-3 sets - 10 reps  ASSESSMENT:  CLINICAL IMPRESSION: Dr Ladona Ridgel presents to PT for first visit following initial evaluation on 06/15/2023.  Patient has had a prolonged period between visits secondary to a beach trip and contracting Covid.  She  reports that she has had 2 falls since initial evaluation and that she has difficulty returning to standing after the falls.  Patient did have improved time noted on all objective testing during visit today.  Patient encouraged to continue to perform HEP provided at initial evaluation and to continue with recumbent biking and walking.  She verbalizes her understanding.  Patient did require occasional recovery periods throughout treatment secondary to fatigue and pain.  Patient to have aquatic PT session for next visit and she is excited to exercise in the pool environment.   OBJECTIVE IMPAIRMENTS: Abnormal gait, decreased activity tolerance, decreased balance, decreased ROM, decreased strength, increased muscle spasms, impaired flexibility, postural dysfunction, and pain.   ACTIVITY LIMITATIONS: bending, standing, stairs, and locomotion level  PARTICIPATION LIMITATIONS: driving, shopping, and community activity  PERSONAL FACTORS: Age, Behavior pattern, Time since onset of injury/illness/exacerbation, and 3+ comorbidities: ACDF 12/20/22,  Long history of medical and surgical diagnoses. Memory issues, LBP since surgery 2008, fibromyalgia, B hip and knee replacements, R achilles tear 2022  are also affecting patient's functional outcome.   REHAB POTENTIAL: Good  CLINICAL DECISION MAKING: Evolving/moderate complexity  EVALUATION COMPLEXITY: Moderate   GOALS: Goals reviewed with patient? Yes  SHORT TERM GOALS: Target date: 07/13/2023   Patient will be independent with initial HEP. Baseline:  Goal status: MET  2.  Patient will demonstrate 5XSTS in < = 10 sec demonstrating improved functional strength Baseline: 15.13 sec Goal status: IN PROGRESS   LONG TERM GOALS: Target date: 09/07/2023  Patient will be independent with advanced/ongoing HEP to improve outcomes and carryover.  Baseline:  Goal status: IN PROGRESS  2.  Patient will be able to ambulate 600' with LRAD with good safety to access  community.  Baseline: 242 feet in 3 min Goal status: IN PROGRESS (see above)  3.  Patient will be able to step up/down curb safely with LRAD for safety with community ambulation.  Baseline:  Goal status: INITIAL   4.  Patient will demonstrate improved TUG score to <= 9 sec indicating decreased risk of falls Baseline:  Goal status: IN PROGRESS (see above)  5.  Patient will demonstrate at least 19/24 on DGI to improve gait stability and reduce risk for falls. Baseline: TBD Goal status: INITIAL  6.  Patient will report improved general mobility by 50% or more to improve QOL. Baseline:  Goal status: INITIAL   PLAN:  PT FREQUENCY: 2x/week  PT DURATION: 12 weeks Patient will be on vacation until the week of 07/10/23  PLANNED INTERVENTIONS: Therapeutic exercises, Therapeutic activity, Neuromuscular re-education, Balance training, Gait training, Patient/Family education, Self Care, Joint mobilization, Stair training, Aquatic Therapy, Dry Needling, Electrical stimulation, Spinal mobilization, Cryotherapy, Moist heat, and Manual therapy.  PLAN FOR NEXT SESSION: Aquatic PT.  Land: complete DGI, assess flexibility deficits and LE strength. Work on general strengthening for LE and core, balance, gait.   Reather Laurence, PT, DPT 07/28/23, 11:56 AM  [facility] [address], [address] [place], [place] [address] Phone # [phone] Fax [phone]

## 2023-08-02 ENCOUNTER — Ambulatory Visit (HOSPITAL_BASED_OUTPATIENT_CLINIC_OR_DEPARTMENT_OTHER): Payer: Medicare Other | Attending: Internal Medicine | Admitting: Physical Therapy

## 2023-08-02 ENCOUNTER — Encounter (HOSPITAL_BASED_OUTPATIENT_CLINIC_OR_DEPARTMENT_OTHER): Payer: Self-pay | Admitting: Physical Therapy

## 2023-08-02 DIAGNOSIS — M5459 Other low back pain: Secondary | ICD-10-CM | POA: Diagnosis not present

## 2023-08-02 DIAGNOSIS — R2689 Other abnormalities of gait and mobility: Secondary | ICD-10-CM | POA: Insufficient documentation

## 2023-08-02 DIAGNOSIS — M6281 Muscle weakness (generalized): Secondary | ICD-10-CM | POA: Diagnosis not present

## 2023-08-02 NOTE — Therapy (Signed)
OUTPATIENT PHYSICAL THERAPY TREATMENT NOTE   Patient Name: Terri ELDRETH MRN: [id number] DOB:Jan 09, 1946, 77 y.o., female Today's Date: 08/02/2023  END OF SESSION:  PT End of Session - 08/02/23 1852     Visit Number 3    Date for PT Re-Evaluation 09/07/23    Authorization Type BCBS MCR    Progress Note Due on Visit 10    PT Start Time 1332    PT Stop Time 1415    PT Time Calculation (min) 43 min    Activity Tolerance Patient tolerated treatment well    Behavior During Therapy WFL for tasks assessed/performed              Past Medical History:  Diagnosis Date   ALLERGIC RHINITIS    Allergy    Anginal pain (HCC)    Blood transfusion without reported diagnosis    Cataract    small right   Cholelithiasis 06/09/2008   gallstones 2009 on ct scan   Complication of anesthesia 2012   went to ICU after bilateral knees , unstable vital signs oct 2012, has surgery since did ok   Coronary artery disease    30 % narrowing lad   Diverticulitis    EPICONDYLITIS, LATERAL    osteoarthritis, previous joint replacements   Fall 10/2018   GERD    Hepatic hemangioma 06/09/2008   Hiatal hernia 03/2000   Hx of adenomatous polyp of colon 06/05/2008   Hx of cardiac catheterization 2008   clean coronarys DrKelly    Hx of chest pain 2003   neg cath remot hx of narrowwing lad in 2003 nl 2008, none in many years   HYPERLIPIDEMIA    IBS (irritable bowel syndrome)    LEUKOPENIA, CHRONIC    Mononucleosis 1963   RAYNAUD'S SYNDROME, HX OF    improved after cardiac meds initiated   Rosacea    facial   Sleep apnea    SLEEP APNEA, OBSTRUCTIVE    cpap, settings "automatic" settings 11-12   Subacute thyroiditis    UNSPECIFIED ANEMIA    Past Surgical History:  Procedure Laterality Date   ABDOMINAL HYSTERECTOMY  1993   bso   ANTERIOR CERVICAL DECOMP/DISCECTOMY FUSION N/A 12/20/2022   Procedure: Cervical four-five Cervical five-six Cervical six-seven Anterior Cervical Decompression Fusion;   Surgeon: Barnett Abu, MD;  Location: [facility];  Service: Neurosurgery;  Laterality: N/A;   ANTERIOR FUSION CERVICAL SPINE N/A 12/20/2022   BACK SURGERY  2008   l 3 to l4 l4 to l5   BARTHOLIN GLAND CYST EXCISION Right 09/09/2019   Procedure: EXCISION OF VULVAR MASS TIMES TWO;  Surgeon: Jerene Bears, MD;  Location: [facility];  Service: Gynecology;  Laterality: Right;   CARDIAC CATHETERIZATION  04/07/2007   Noncritical coronary artery disease. Contiue medical therapy.   CARDIAC CATHETERIZATION  12/03/2007   Normal LV function. Mild angiographic mitral valve prolapse. Normal coronary arteries.   CARDIOVASCULAR STRESS TEST  11/09/2007   Moderate ischemia in Mid Anterior, Mid Anteroseptal, Apical Anterior, and Apical Septal regions. EKG negative for ischemia.   CATARACT EXTRACTION Right 04/25/2023   CESAREAN SECTION  1985, 1989   COLON RESECTION Left 01/03/2022   Procedure: COLON RESECTION;  Surgeon: Quentin Ore, MD;  Location: [facility];  Service: General;  Laterality: Left;   COLOSTOMY  01/03/2022   Procedure: COLOSTOMY;  Surgeon: Quentin Ore, MD;  Location: [facility];  Service: General;;   dental implants  11/2002,11/2011,11/2012   dental implants  x 6   DENTAL SURGERY     DILATION AND CURETTAGE OF UTERUS     d and e 1988 and 1990   JOINT REPLACEMENT  09/21/2011   bilateral knee   KNEE ARTHROSCOPY     bilateral   LAPAROTOMY N/A 01/03/2022   Procedure: EXPLORATORY LAPAROTOMY;  Surgeon: Quentin Ore, MD;  Location: [facility];  Service: General;  Laterality: N/A;   lumbar surgery fixation with disc replacement  09/2007   Left   NASAL SEPTOPLASTY W/ TURBINOPLASTY  05/2000   NOCTURNAL POLYSOMNOGRAM  12/31/2006   Severe obstructive sleep apnea. AHI-87/hr   REPLACEMENT TOTAL KNEE  2012   bilateral   TONSILLECTOMY  1952   adenoids too   TOTAL HIP ARTHROPLASTY Left 10/21/2013   Procedure: LEFT TOTAL HIP ARTHROPLASTY ANTERIOR APPROACH;  Surgeon: Loanne Drilling, MD;  Location: [facility];  Service: Orthopedics;  Laterality: Left;   TOTAL HIP ARTHROPLASTY Right 07/16/2014   Procedure: RIGHT TOTAL HIP ARTHROPLASTY ANTERIOR APPROACH;  Surgeon: Loanne Drilling, MD;  Location: [facility];  Service: Orthopedics;  Laterality: Right;   TRANSTHORACIC ECHOCARDIOGRAM  11/09/2007   EF 58%, LV systolic function normal. Mild aortic root dilation.   WRIST SURGERY Left 04/03/2014   ORIF "distal radial head fracture"   XI ROBOTIC ASSISTED COLOSTOMY TAKEDOWN N/A 09/26/2022   Procedure: ROBOTIC OSTOMY REVERSAL;  Surgeon: Quentin Ore, MD;  Location: [facility];  Service: General;  Laterality: N/A;   Patient Active Problem List   Diagnosis Date Noted   Cervical myelopathy (HCC) 12/20/2022   History of creation of ostomy (HCC) 09/26/2022   Colostomy in place (HCC) 01/09/2022   IBS (irritable bowel syndrome) 01/05/2022   Physical deconditioning 01/05/2022   Perforation of colon s/p Hartmann colectomy/colostomy 01/03/2022 01/02/2022   Lumbar post-laminectomy syndrome 10/07/2021   Cervical spondylosis without myelopathy 10/07/2021   Primary osteoarthritis of both feet 06/18/2017   Status post bilateral total hip replacement 06/18/2017   Status post total knee replacement, bilateral 06/18/2017   Degenerative disc disease, lumbar status post fusion 06/18/2017   Degenerative disc disease, cervical 06/18/2017   History of cholelithiasis 06/18/2017   Other chronic pain 02/01/2017   Visit for preventive health examination 01/21/2015   Medicare annual wellness visit, subsequent 01/21/2015   BMI 40.0-44.9, adult (HCC) 01/21/2015   Hyperlipidemia LDL goal <70 01/12/2015   Morbid obesity (HCC) 01/12/2015   Hyperglycemia 01/10/2014   Mild CAD 01/06/2014   Medication monitoring encounter 09/22/2012   Hx of cardiac catheterization    Adenomatous polyp of colon 06/14/2012   ROSACEA 04/26/2010   Obstructive sleep apnea 09/09/2009   Normocytic anemia 12/15/2008    LEUKOPENIA, CHRONIC 01/08/2008   Elevated lipids 08/17/2007   ALLERGIC RHINITIS 08/17/2007   GERD 08/17/2007   Primary osteoarthritis of both hands 08/17/2007    PCP: Madelin Headings, MD   REFERRING PROVIDER: Madelin Headings, MD   REFERRING DIAG: M54.10 (ICD-10-CM) - Back pain with left-sided radiculopathy  G89.29 (ICD-10-CM) - Other chronic pain M15.9 (ICD-10-CM) - Osteoarthritis of multiple joints, unspecified osteoarthritis type   Rationale for Evaluation and Treatment: Rehabilitation  THERAPY DIAG:  Muscle weakness (generalized)  Other abnormalities of gait and mobility  Other low back pain  ONSET DATE: 01/03/23  SUBJECTIVE:  SUBJECTIVE STATEMENT: Pt reports pain walking to setting 8/10, with sitting decreases to 4/10  PERTINENT HISTORY:  ACDF 12/20/22, Long history of medical and surgical diagnoses. Memory issues, LBP since surgery 2008, fibromyalgia, B hip and knee replacements, R achilles tear 2022  PAIN:  Are you having pain? Yes: NPRS scale: 5-8/10 Pain location: LBP some sciatica B Pain description: ache Aggravating factors: sitting, bending Relieving factors: flexion  PRECAUTIONS: Other: ACDF 12/20/22  RED FLAGS: None   WEIGHT BEARING RESTRICTIONS: No  FALLS:  Has patient fallen in last 6 months? Yes. Number of falls fell in Dr.'s office  3 months ago  LIVING ENVIRONMENT: Lives with: lives with their spouse Lives in: House/apartment Stairs: Yes: Internal: 16 steps; can reach both and External: 3-9 steps; can reach both Has following equipment at home: Single point cane, Walker - 2 wheeled, and Environmental consultant - 4 wheeled  OCCUPATION: retired  PLOF: Independent with household mobility with device  PATIENT GOALS: Would like to move around without grunting  NEXT MD VISIT:  6-12 months  OBJECTIVE:   DIAGNOSTIC FINDINGS:  N/A   COGNITION: Overall cognitive status:  reports memory issues      SENSATION: WFL  MUSCLE LENGTH:   POSTURE: rounded shoulders, forward head, and flexed trunk   PALPATION: TBD  LUMBAR ROM: *pain  AROM eval  Flexion To knees *  Extension Neutral *  Right lateral flexion 25%  Left lateral flexion 25%  Right rotation 75%  Left rotation 75%   (Blank rows = not tested)  LOWER EXTREMITY ROM:   ER limited B - unable to place foot on opp knee  Active  Right eval Left eval  Hip flexion    Hip extension    Hip abduction    Hip adduction    Hip internal rotation    Hip external rotation    Knee flexion    Knee extension    Ankle dorsiflexion    Ankle plantarflexion    Ankle inversion    Ankle eversion     (Blank rows = not tested)  LOWER EXTREMITY MMT:    MMT Right eval Left eval  Hip flexion 5 4+  Hip extension    Hip abduction    Hip adduction    Hip internal rotation    Hip external rotation    Knee flexion 5 5  Knee extension 5 4+  Ankle dorsiflexion  5 in available range  Ankle plantarflexion    Ankle inversion    Ankle eversion     (Blank rows = not tested)  UPPER EXTREMITY MMT: Shoulder flex and ABD 4+/5, IR/ER 5/5   FUNCTIONAL TESTS:  Eval: 5 times sit to stand: 31 sec Timed up and go (TUG): 15.13 SEC 3 minute walk test: 242 ft - reports increased pain in LB, R hip and L calf after Dynamic Gait Index: TBD  07/28/2023: 5 times sit to stand:  16.39 sec Timed up and go (TUG):  13.47 sec without assistive device 3 minute walk test:  3 minute ambulation: 259 ft with increased back and hip pain as a result (HR of 92 bpm and 97% O2 sats following)  GAIT: Distance walked: 242 ft Assistive device utilized: Single point cane Level of assistance: Modified independence Comments: short step length B, heel strike decreases B as gait progresses  TODAY'S TREATMENT:  DATE:  08/02/23  Pt seen for aquatic therapy today.  Treatment took place in water 3.5-4.75 ft in depth at the [facility] pool. Temp of water was 91.  Pt entered/exited the pool via stairs using step to pattern with hand rail.  *CGA with stair negotiation *Standing 3.6  ft ue support on wall: high knee marching; relaxed squat *Forward walking in 3.8 ft ue support on barbell x 4 widths then x 2 backward, x 2 side stepping  - short standing rest period. *Seated on lift: LAQ; hip add/abd; cycling 2 sets each ~ 1-2 minutes *STS from lift chair x 5 ue support barbell.  Vc for weight shift and immediate standing balance *standing ue support wall: hip add/abd 2 x 5 *L stretch x 3  Pt requires the buoyancy and hydrostatic pressure of water for support, and to offload joints by unweighting joint load by at least 50 % in navel deep water and by at least 75-80% in chest to neck deep water.  Viscosity of the water is needed for resistance of strengthening. Water current perturbations provides challenge to standing balance requiring increased core activation.       07/28/2023: 5 times sit to stand and TUG Nustep level 3 x6 min with PT present to discuss status (HR of 81 bpm and 97% O2 following) 3 minute ambulation: 259 ft with increased back and hip pain as a result (HR of 92 bpm and 97% O2 sats following) Seated:  heel/toe raises, marching, LAQ.   Seated hip adduction with ball squeeze 2x10 Seated hip abduction with red tband 2x10 Seated hamstring curl with red tband 2x10 bilat   06/15/23  See pt ed and HEP  PATIENT EDUCATION:  Education details: PT eval findings, anticipated POC, need for further assessment of LE flexibility, strength and balance, and initial HEP  Person educated: Patient Education method: Explanation, Demonstration, and Handouts Education comprehension: verbalized  understanding and returned demonstration  HOME EXERCISE PROGRAM: Access Code: [facility] URL: [email or web address] Date: 06/15/2023 Prepared by: Raynelle Fanning  Exercises - Sit to Stand with Arms Crossed  - 3 x daily - 3-4 x weekly - 1 sets - 10 reps - Heel Raises with Counter Support  - 1 x daily - 3-4 x weekly - 1-3 sets - 10 reps - Heel Toe Raises with Counter Support  - 1 x daily - 3-4 x weekly - 1-3 sets - 10 reps - Standing March with Counter Support  - 1 x daily - 3-4 x weekly - 1-3 sets - 10 reps - Standing Hip Abduction with Counter Support  - 1 x daily - 3-4 x weekly - 1-3 sets - 10 reps - Standing Hip Extension with Counter Support  - 1 x daily - 3-4 x weekly - 1-3 sets - 10 reps  ASSESSMENT:  CLINICAL IMPRESSION: Pt familiar with setting as she has been a pt here before.  She demonstrates safety with supervision in setting therapist on deck following her back and forth as needed.  She was able to maintain continuous walking for 12 mins in all directions before needing rest period.  Pain sensitivity decreases as session continues. Pt instruction required for exiting pool/stair negotiation focusing on use of LE strength vs heavy ue support and pulling.  She has good execution. She is a good candidate for aquatic therapy and will benefit from the properties of water which provide a safe environment against falls to strengthening and improve balance/gait.   Initial Impression Dr Ladona Ridgel presents to PT for  first visit following initial evaluation on 06/15/2023.  Patient has had a prolonged period between visits secondary to a beach trip and contracting Covid.  She reports that she has had 2 falls since initial evaluation and that she has difficulty returning to standing after the falls.  Patient did have improved time noted on all objective testing during visit today.  Patient encouraged to continue to perform HEP provided at initial evaluation and to continue with recumbent biking and  walking.  She verbalizes her understanding.  Patient did require occasional recovery periods throughout treatment secondary to fatigue and pain.  Patient to have aquatic PT session for next visit and she is excited to exercise in the pool environment.   OBJECTIVE IMPAIRMENTS: Abnormal gait, decreased activity tolerance, decreased balance, decreased ROM, decreased strength, increased muscle spasms, impaired flexibility, postural dysfunction, and pain.   ACTIVITY LIMITATIONS: bending, standing, stairs, and locomotion level  PARTICIPATION LIMITATIONS: driving, shopping, and community activity  PERSONAL FACTORS: Age, Behavior pattern, Time since onset of injury/illness/exacerbation, and 3+ comorbidities: ACDF 12/20/22, Long history of medical and surgical diagnoses. Memory issues, LBP since surgery 2008, fibromyalgia, B hip and knee replacements, R achilles tear 2022  are also affecting patient's functional outcome.   REHAB POTENTIAL: Good  CLINICAL DECISION MAKING: Evolving/moderate complexity  EVALUATION COMPLEXITY: Moderate   GOALS: Goals reviewed with patient? Yes  SHORT TERM GOALS: Target date: 07/13/2023   Patient will be independent with initial HEP. Baseline:  Goal status: MET  2.  Patient will demonstrate 5XSTS in < = 10 sec demonstrating improved functional strength Baseline: 15.13 sec Goal status: IN PROGRESS   LONG TERM GOALS: Target date: 09/07/2023  Patient will be independent with advanced/ongoing HEP to improve outcomes and carryover.  Baseline:  Goal status: IN PROGRESS  2.  Patient will be able to ambulate 600' with LRAD with good safety to access community.  Baseline: 242 feet in 3 min Goal status: IN PROGRESS (see above)  3.  Patient will be able to step up/down curb safely with LRAD for safety with community ambulation.  Baseline:  Goal status: INITIAL   4.  Patient will demonstrate improved TUG score to <= 9 sec indicating decreased risk of falls Baseline:   Goal status: IN PROGRESS (see above)  5.  Patient will demonstrate at least 19/24 on DGI to improve gait stability and reduce risk for falls. Baseline: TBD Goal status: INITIAL  6.  Patient will report improved general mobility by 50% or more to improve QOL. Baseline:  Goal status: INITIAL   PLAN:  PT FREQUENCY: 2x/week  PT DURATION: 12 weeks Patient will be on vacation until the week of 07/10/23  PLANNED INTERVENTIONS: Therapeutic exercises, Therapeutic activity, Neuromuscular re-education, Balance training, Gait training, Patient/Family education, Self Care, Joint mobilization, Stair training, Aquatic Therapy, Dry Needling, Electrical stimulation, Spinal mobilization, Cryotherapy, Moist heat, and Manual therapy.  PLAN FOR NEXT SESSION: Aquatic PT.  Land: complete DGI, assess flexibility deficits and LE strength. Work on general strengthening for LE and core, balance, gait.   Corrie Dandy Tomma Lightning) Czar Ysaguirre MPT 08/02/23, 6:52 PM

## 2023-08-04 ENCOUNTER — Encounter: Payer: Medicare Other | Admitting: Rehabilitative and Restorative Service Providers"

## 2023-08-08 ENCOUNTER — Ambulatory Visit (HOSPITAL_BASED_OUTPATIENT_CLINIC_OR_DEPARTMENT_OTHER): Payer: Medicare Other | Admitting: Physical Therapy

## 2023-08-11 ENCOUNTER — Encounter: Payer: Self-pay | Admitting: Rehabilitative and Restorative Service Providers"

## 2023-08-11 ENCOUNTER — Ambulatory Visit: Payer: Medicare Other | Attending: Internal Medicine | Admitting: Rehabilitative and Restorative Service Providers"

## 2023-08-11 DIAGNOSIS — M5459 Other low back pain: Secondary | ICD-10-CM | POA: Insufficient documentation

## 2023-08-11 DIAGNOSIS — Z9181 History of falling: Secondary | ICD-10-CM | POA: Diagnosis not present

## 2023-08-11 DIAGNOSIS — M961 Postlaminectomy syndrome, not elsewhere classified: Secondary | ICD-10-CM | POA: Insufficient documentation

## 2023-08-11 DIAGNOSIS — R262 Difficulty in walking, not elsewhere classified: Secondary | ICD-10-CM | POA: Insufficient documentation

## 2023-08-11 DIAGNOSIS — R2689 Other abnormalities of gait and mobility: Secondary | ICD-10-CM | POA: Insufficient documentation

## 2023-08-11 DIAGNOSIS — M6281 Muscle weakness (generalized): Secondary | ICD-10-CM | POA: Insufficient documentation

## 2023-08-11 DIAGNOSIS — M545 Low back pain, unspecified: Secondary | ICD-10-CM | POA: Diagnosis not present

## 2023-08-11 NOTE — Therapy (Signed)
OUTPATIENT PHYSICAL THERAPY TREATMENT NOTE   Patient Name: Terri Rodriguez MRN: [id number] DOB:Jul 24, 1946, 77 y.o., female Today's Date: 08/11/2023  END OF SESSION:  PT End of Session - 08/11/23 1108     Visit Number 4    Date for PT Re-Evaluation 09/07/23    Authorization Type BCBS MCR    Progress Note Due on Visit 10    PT Start Time 1103    PT Stop Time 1141    PT Time Calculation (min) 38 min    Activity Tolerance Patient tolerated treatment well    Behavior During Therapy WFL for tasks assessed/performed              Past Medical History:  Diagnosis Date   ALLERGIC RHINITIS    Allergy    Anginal pain (HCC)    Blood transfusion without reported diagnosis    Cataract    small right   Cholelithiasis 06/09/2008   gallstones 2009 on ct scan   Complication of anesthesia 2012   went to ICU after bilateral knees , unstable vital signs oct 2012, has surgery since did ok   Coronary artery disease    30 % narrowing lad   Diverticulitis    EPICONDYLITIS, LATERAL    osteoarthritis, previous joint replacements   Fall 10/2018   GERD    Hepatic hemangioma 06/09/2008   Hiatal hernia 03/2000   Hx of adenomatous polyp of colon 06/05/2008   Hx of cardiac catheterization 2008   clean coronarys DrKelly    Hx of chest pain 2003   neg cath remot hx of narrowwing lad in 2003 nl 2008, none in many years   HYPERLIPIDEMIA    IBS (irritable bowel syndrome)    LEUKOPENIA, CHRONIC    Mononucleosis 1963   RAYNAUD'S SYNDROME, HX OF    improved after cardiac meds initiated   Rosacea    facial   Sleep apnea    SLEEP APNEA, OBSTRUCTIVE    cpap, settings "automatic" settings 11-12   Subacute thyroiditis    UNSPECIFIED ANEMIA    Past Surgical History:  Procedure Laterality Date   ABDOMINAL HYSTERECTOMY  1993   bso   ANTERIOR CERVICAL DECOMP/DISCECTOMY FUSION N/A 12/20/2022   Procedure: Cervical four-five Cervical five-six Cervical six-seven Anterior Cervical Decompression Fusion;   Surgeon: Barnett Abu, MD;  Location: [facility];  Service: Neurosurgery;  Laterality: N/A;   ANTERIOR FUSION CERVICAL SPINE N/A 12/20/2022   BACK SURGERY  2008   l 3 to l4 l4 to l5   BARTHOLIN GLAND CYST EXCISION Right 09/09/2019   Procedure: EXCISION OF VULVAR MASS TIMES TWO;  Surgeon: Jerene Bears, MD;  Location: [facility];  Service: Gynecology;  Laterality: Right;   CARDIAC CATHETERIZATION  04/07/2007   Noncritical coronary artery disease. Contiue medical therapy.   CARDIAC CATHETERIZATION  12/03/2007   Normal LV function. Mild angiographic mitral valve prolapse. Normal coronary arteries.   CARDIOVASCULAR STRESS TEST  11/09/2007   Moderate ischemia in Mid Anterior, Mid Anteroseptal, Apical Anterior, and Apical Septal regions. EKG negative for ischemia.   CATARACT EXTRACTION Right 04/25/2023   CESAREAN SECTION  1985, 1989   COLON RESECTION Left 01/03/2022   Procedure: COLON RESECTION;  Surgeon: Quentin Ore, MD;  Location: [facility];  Service: General;  Laterality: Left;   COLOSTOMY  01/03/2022   Procedure: COLOSTOMY;  Surgeon: Quentin Ore, MD;  Location: [facility];  Service: General;;   dental implants  11/2002,11/2011,11/2012   dental implants  x 6   DENTAL SURGERY     DILATION AND CURETTAGE OF UTERUS     d and e 1988 and 1990   JOINT REPLACEMENT  09/21/2011   bilateral knee   KNEE ARTHROSCOPY     bilateral   LAPAROTOMY N/A 01/03/2022   Procedure: EXPLORATORY LAPAROTOMY;  Surgeon: Quentin Ore, MD;  Location: [facility];  Service: General;  Laterality: N/A;   lumbar surgery fixation with disc replacement  09/2007   Left   NASAL SEPTOPLASTY W/ TURBINOPLASTY  05/2000   NOCTURNAL POLYSOMNOGRAM  12/31/2006   Severe obstructive sleep apnea. AHI-87/hr   REPLACEMENT TOTAL KNEE  2012   bilateral   TONSILLECTOMY  1952   adenoids too   TOTAL HIP ARTHROPLASTY Left 10/21/2013   Procedure: LEFT TOTAL HIP ARTHROPLASTY ANTERIOR APPROACH;  Surgeon: Loanne Drilling, MD;  Location: [facility];  Service: Orthopedics;  Laterality: Left;   TOTAL HIP ARTHROPLASTY Right 07/16/2014   Procedure: RIGHT TOTAL HIP ARTHROPLASTY ANTERIOR APPROACH;  Surgeon: Loanne Drilling, MD;  Location: [facility];  Service: Orthopedics;  Laterality: Right;   TRANSTHORACIC ECHOCARDIOGRAM  11/09/2007   EF 58%, LV systolic function normal. Mild aortic root dilation.   WRIST SURGERY Left 04/03/2014   ORIF "distal radial head fracture"   XI ROBOTIC ASSISTED COLOSTOMY TAKEDOWN N/A 09/26/2022   Procedure: ROBOTIC OSTOMY REVERSAL;  Surgeon: Quentin Ore, MD;  Location: [facility];  Service: General;  Laterality: N/A;   Patient Active Problem List   Diagnosis Date Noted   Cervical myelopathy (HCC) 12/20/2022   History of creation of ostomy (HCC) 09/26/2022   Colostomy in place (HCC) 01/09/2022   IBS (irritable bowel syndrome) 01/05/2022   Physical deconditioning 01/05/2022   Perforation of colon s/p Hartmann colectomy/colostomy 01/03/2022 01/02/2022   Lumbar post-laminectomy syndrome 10/07/2021   Cervical spondylosis without myelopathy 10/07/2021   Primary osteoarthritis of both feet 06/18/2017   Status post bilateral total hip replacement 06/18/2017   Status post total knee replacement, bilateral 06/18/2017   Degenerative disc disease, lumbar status post fusion 06/18/2017   Degenerative disc disease, cervical 06/18/2017   History of cholelithiasis 06/18/2017   Other chronic pain 02/01/2017   Visit for preventive health examination 01/21/2015   Medicare annual wellness visit, subsequent 01/21/2015   BMI 40.0-44.9, adult (HCC) 01/21/2015   Hyperlipidemia LDL goal <70 01/12/2015   Morbid obesity (HCC) 01/12/2015   Hyperglycemia 01/10/2014   Mild CAD 01/06/2014   Medication monitoring encounter 09/22/2012   Hx of cardiac catheterization    Adenomatous polyp of colon 06/14/2012   ROSACEA 04/26/2010   Obstructive sleep apnea 09/09/2009   Normocytic anemia 12/15/2008    LEUKOPENIA, CHRONIC 01/08/2008   Elevated lipids 08/17/2007   ALLERGIC RHINITIS 08/17/2007   GERD 08/17/2007   Primary osteoarthritis of both hands 08/17/2007    PCP: Madelin Headings, MD   REFERRING PROVIDER: Madelin Headings, MD   REFERRING DIAG: M54.10 (ICD-10-CM) - Back pain with left-sided radiculopathy  G89.29 (ICD-10-CM) - Other chronic pain M15.9 (ICD-10-CM) - Osteoarthritis of multiple joints, unspecified osteoarthritis type   Rationale for Evaluation and Treatment: Rehabilitation  THERAPY DIAG:  Muscle weakness (generalized)  Other abnormalities of gait and mobility  Other low back pain  ONSET DATE: 01/03/23  SUBJECTIVE:  SUBJECTIVE STATEMENT: Pt reports general pain, but that overall, she is doing better.  Pt reports that she has been doing her standing exercises.  PERTINENT HISTORY:  ACDF 12/20/22, Long history of medical and surgical diagnoses. Memory issues, LBP since surgery 2008, fibromyalgia, B hip and knee replacements, R achilles tear 2022  PAIN:  Are you having pain? Yes: NPRS scale: 3-4/10 Pain location: LBP some sciatica B Pain description: ache Aggravating factors: sitting, bending Relieving factors: flexion  PRECAUTIONS: Other: ACDF 12/20/22  RED FLAGS: None   WEIGHT BEARING RESTRICTIONS: No  FALLS:  Has patient fallen in last 6 months? Yes. Number of falls fell in Dr.'s office  3 months ago  LIVING ENVIRONMENT: Lives with: lives with their spouse Lives in: House/apartment Stairs: Yes: Internal: 16 steps; can reach both and External: 3-9 steps; can reach both Has following equipment at home: Single point cane, Walker - 2 wheeled, and Environmental consultant - 4 wheeled  OCCUPATION: retired  PLOF: Independent with household mobility with device  PATIENT GOALS: Would like  to move around without grunting  NEXT MD VISIT: 6-12 months  OBJECTIVE:   DIAGNOSTIC FINDINGS:  N/A   COGNITION: Overall cognitive status:  reports memory issues      SENSATION: WFL  MUSCLE LENGTH:   POSTURE: rounded shoulders, forward head, and flexed trunk   PALPATION: TBD  LUMBAR ROM: *pain  AROM eval  Flexion To knees *  Extension Neutral *  Right lateral flexion 25%  Left lateral flexion 25%  Right rotation 75%  Left rotation 75%   (Blank rows = not tested)  LOWER EXTREMITY ROM:   ER limited B - unable to place foot on opp knee  Active  Right eval Left eval  Hip flexion    Hip extension    Hip abduction    Hip adduction    Hip internal rotation    Hip external rotation    Knee flexion    Knee extension    Ankle dorsiflexion    Ankle plantarflexion    Ankle inversion    Ankle eversion     (Blank rows = not tested)  LOWER EXTREMITY MMT:    MMT Right eval Left eval  Hip flexion 5 4+  Hip extension    Hip abduction    Hip adduction    Hip internal rotation    Hip external rotation    Knee flexion 5 5  Knee extension 5 4+  Ankle dorsiflexion  5 in available range  Ankle plantarflexion    Ankle inversion    Ankle eversion     (Blank rows = not tested)  UPPER EXTREMITY MMT: Shoulder flex and ABD 4+/5, IR/ER 5/5   FUNCTIONAL TESTS:  Eval: 5 times sit to stand: 31 sec Timed up and go (TUG): 15.13 SEC 3 minute walk test: 242 ft - reports increased pain in LB, R hip and L calf after Dynamic Gait Index: TBD  07/28/2023: 5 times sit to stand:  16.39 sec Timed up and go (TUG):  13.47 sec without assistive device 3 minute walk test:  3 minute ambulation: 259 ft with increased back and hip pain as a result (HR of 92 bpm and 97% O2 sats following)  GAIT: Distance walked: 242 ft Assistive device utilized: Single point cane Level of assistance: Modified independence Comments: short step length B, heel strike decreases B as gait  progresses  TODAY'S TREATMENT:  DATE:  08/11/2023 Nustep level 3 x6 min with PT present to discuss status Dynamic Gait Index 3/24 Seated:  heel/toe raises, marching, LAQ.  2x10 each bilat Seated hip adduction with ball squeeze 2x10 Seated hip abduction with red tband 2x10 Seated hamstring curl with red tband 2x10 bilat Sit to/from stand 3x5 with UE Standing at barre:  marching, hip abduction, and hip extension.  X10 each bilat     08/02/23 Pt seen for aquatic therapy today.  Treatment took place in water 3.5-4.75 ft in depth at the [facility] pool. Temp of water was 91.  Pt entered/exited the pool via stairs using step to pattern with hand rail.  *CGA with stair negotiation *Standing 3.6  ft ue support on wall: high knee marching; relaxed squat *Forward walking in 3.8 ft ue support on barbell x 4 widths then x 2 backward, x 2 side stepping  - short standing rest period. *Seated on lift: LAQ; hip add/abd; cycling 2 sets each ~ 1-2 minutes *STS from lift chair x 5 ue support barbell.  Vc for weight shift and immediate standing balance *standing ue support wall: hip add/abd 2 x 5 *L stretch x 3  Pt requires the buoyancy and hydrostatic pressure of water for support, and to offload joints by unweighting joint load by at least 50 % in navel deep water and by at least 75-80% in chest to neck deep water.  Viscosity of the water is needed for resistance of strengthening. Water current perturbations provides challenge to standing balance requiring increased core activation.       07/28/2023: 5 times sit to stand and TUG Nustep level 3 x6 min with PT present to discuss status (HR of 81 bpm and 97% O2 following) 3 minute ambulation: 259 ft with increased back and hip pain as a result (HR of 92 bpm and 97% O2 sats following) Seated:  heel/toe raises, marching,  LAQ.   Seated hip adduction with ball squeeze 2x10 Seated hip abduction with red tband 2x10 Seated hamstring curl with red tband 2x10 bilat     PATIENT EDUCATION:  Education details: PT eval findings, anticipated POC, need for further assessment of LE flexibility, strength and balance, and initial HEP  Person educated: Patient Education method: Explanation, Demonstration, and Handouts Education comprehension: verbalized understanding and returned demonstration  HOME EXERCISE PROGRAM: Access Code: [facility] URL: [email or web address] Date: 08/11/2023 Prepared by: Clydie Braun Deivi Huckins  Exercises - Sit to Stand with Arms Crossed  - 3 x daily - 3-4 x weekly - 1 sets - 10 reps - Heel Raises with Counter Support  - 1 x daily - 3-4 x weekly - 1-3 sets - 10 reps - Heel Toe Raises with Counter Support  - 1 x daily - 3-4 x weekly - 1-3 sets - 10 reps - Standing March with Counter Support  - 1 x daily - 3-4 x weekly - 1-3 sets - 10 reps - Standing Hip Abduction with Counter Support  - 1 x daily - 3-4 x weekly - 1-3 sets - 10 reps - Standing Hip Extension with Counter Support  - 1 x daily - 3-4 x weekly - 1-3 sets - 10 reps - Seated March  - 1 x daily - 7 x weekly - 2 sets - 10 reps - Seated Long Arc Quad  - 1 x daily - 7 x weekly - 2 sets - 10 reps - Seated Hip Adduction Isometrics with Ball  - 1 x daily - 7 x weekly - 2  sets - 10 reps - Seated Hip Abduction with Resistance  - 1 x daily - 7 x weekly - 2 sets - 10 reps  ASSESSMENT:  CLINICAL IMPRESSION: 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  Patient able to participate in DGI balance assessment with a 3/24 score during session and requires her SPC.  Patient with difficulty with all higher level balance tasks and requires close SBA/CGA to prevent loss of balance.  Patient with difficulty with sit to stand, so broke them up into 3 sets of 5.  Patient continues to require both  aquatic and land-based PT appointments to progress with improved functional strength.   OBJECTIVE IMPAIRMENTS: Abnormal gait, decreased activity tolerance, decreased balance, decreased ROM, decreased strength, increased muscle spasms, impaired flexibility, postural dysfunction, and pain.   ACTIVITY LIMITATIONS: bending, standing, stairs, and locomotion level  PARTICIPATION LIMITATIONS: driving, shopping, and community activity  PERSONAL FACTORS: Age, Behavior pattern, Time since onset of injury/illness/exacerbation, and 3+ comorbidities: ACDF 12/20/22, Long history of medical and surgical diagnoses. Memory issues, LBP since surgery 2008, fibromyalgia, B hip and knee replacements, R achilles tear 2022  are also affecting patient's functional outcome.   REHAB POTENTIAL: Good  CLINICAL DECISION MAKING: Evolving/moderate complexity  EVALUATION COMPLEXITY: Moderate   GOALS: Goals reviewed with patient? Yes  SHORT TERM GOALS: Target date: 07/13/2023   Patient will be independent with initial HEP. Baseline:  Goal status: MET  2.  Patient will demonstrate 5XSTS in < = 10 sec demonstrating improved functional strength Baseline: 15.13 sec Goal status: IN PROGRESS (see above)   LONG TERM GOALS: Target date: 09/07/2023  Patient will be independent with advanced/ongoing HEP to improve outcomes and carryover.  Baseline:  Goal status: IN PROGRESS  2.  Patient will be able to ambulate 600' with LRAD with good safety to access community.  Baseline: 242 feet in 3 min Goal status: IN PROGRESS (see above)  3.  Patient will be able to step up/down curb safely with LRAD for safety with community ambulation.  Baseline:  Goal status: INITIAL   4.  Patient will demonstrate improved TUG score to <= 9 sec indicating decreased risk of falls Baseline:  Goal status: IN PROGRESS (see above)  5.  Patient will demonstrate at least 19/24 on DGI to improve gait stability and reduce risk for  falls. Baseline: 3/24 Goal status: IN PROGRESS  6.  Patient will report improved general mobility by 50% or more to improve QOL. Baseline:  Goal status: INITIAL   PLAN:  PT FREQUENCY: 2x/week  PT DURATION: 12 weeks Patient will be on vacation until the week of 07/10/23  PLANNED INTERVENTIONS: Therapeutic exercises, Therapeutic activity, Neuromuscular re-education, Balance training, Gait training, Patient/Family education, Self Care, Joint mobilization, Stair training, Aquatic Therapy, Dry Needling, Electrical stimulation, Spinal mobilization, Cryotherapy, Moist heat, and Manual therapy.  PLAN FOR NEXT SESSION: Aquatic PT.  Land: flexibility deficits and LE strength. Work on general strengthening for LE and core, balance, gait.   Reather Laurence, PT, DPT 08/11/23, 11:53 AM  [facility] [address], [address] [place], [place] [address] Phone # [phone] Fax [phone]

## 2023-08-14 ENCOUNTER — Encounter (HOSPITAL_BASED_OUTPATIENT_CLINIC_OR_DEPARTMENT_OTHER): Payer: Self-pay | Admitting: Physical Therapy

## 2023-08-14 ENCOUNTER — Ambulatory Visit (HOSPITAL_BASED_OUTPATIENT_CLINIC_OR_DEPARTMENT_OTHER): Payer: Medicare Other | Attending: Internal Medicine | Admitting: Physical Therapy

## 2023-08-14 DIAGNOSIS — Z9181 History of falling: Secondary | ICD-10-CM | POA: Diagnosis not present

## 2023-08-14 DIAGNOSIS — R2689 Other abnormalities of gait and mobility: Secondary | ICD-10-CM | POA: Diagnosis not present

## 2023-08-14 DIAGNOSIS — M5459 Other low back pain: Secondary | ICD-10-CM | POA: Insufficient documentation

## 2023-08-14 DIAGNOSIS — M6281 Muscle weakness (generalized): Secondary | ICD-10-CM | POA: Insufficient documentation

## 2023-08-14 NOTE — Therapy (Signed)
OUTPATIENT PHYSICAL THERAPY TREATMENT NOTE   Patient Name: Terri Rodriguez MRN: [id number] DOB:February 16, 1946, 77 y.o., female Today's Date: 08/14/2023  END OF SESSION:  PT End of Session - 08/14/23 1112     Visit Number 5    Date for PT Re-Evaluation 09/07/23    Authorization Type BC[employer] MCR    Progress Note Due on Visit 10    PT Start Time 1111    PT Stop Time 1155    PT Time Calculation (min) 44 min    Activity Tolerance Patient tolerated treatment well    Behavior During Therapy WFL for tasks assessed/performed              Past Medical History:  Diagnosis Date   ALLERGIC RHINITIS    Allergy    Anginal pain (HCC)    Blood transfusion without reported diagnosis    Cataract    small right   Cholelithiasis 06/09/2008   gallstones 2009 on ct scan   Complication of anesthesia 2012   went to ICU after bilateral knees , unstable vital signs oct 2012, has surgery since did ok   Coronary artery disease    30 % narrowing lad   Diverticulitis    EPICONDYLITIS, LATERAL    osteoarthritis, previous joint replacements   Fall 10/2018   GERD    Hepatic hemangioma 06/09/2008   Hiatal hernia 03/2000   Hx of adenomatous polyp of colon 06/05/2008   Hx of cardiac catheterization 2008   clean coronarys DrKelly    Hx of chest pain 2003   neg cath remot hx of narrowwing lad in 2003 nl 2008, none in many years   HYPERLIPIDEMIA    IBS (irritable bowel syndrome)    LEUKOPENIA, CHRONIC    Mononucleosis 1963   RAYNAUD'S SYNDROME, HX OF    improved after cardiac meds initiated   Rosacea    facial   Sleep apnea    SLEEP APNEA, OBSTRUCTIVE    cpap, settings "automatic" settings 11-12   Subacute thyroiditis    UNSPECIFIED ANEMIA    Past Surgical History:  Procedure Laterality Date   ABDOMINAL HYSTERECTOMY  1993   bso   ANTERIOR CERVICAL DECOMP/DISCECTOMY FUSION N/A 12/20/2022   Procedure: Cervical four-five Cervical five-six Cervical six-seven Anterior Cervical Decompression Fusion;   Surgeon: Barnett Abu, MD;  Location: [facility];  Service: Neurosurgery;  Laterality: N/A;   ANTERIOR FUSION CERVICAL SPINE N/A 12/20/2022   BACK SURGERY  2008   l 3 to l4 l4 to l5   BARTHOLIN GLAND CYST EXCISION Right 09/09/2019   Procedure: EXCISION OF VULVAR MASS TIMES TWO;  Surgeon: Jerene Bears, MD;  Location: [facility];  Service: Gynecology;  Laterality: Right;   CARDIAC CATHETERIZATION  04/07/2007   Noncritical coronary artery disease. Contiue medical therapy.   CARDIAC CATHETERIZATION  12/03/2007   Normal LV function. Mild angiographic mitral valve prolapse. Normal coronary arteries.   CARDIOVASCULAR STRESS TEST  11/09/2007   Moderate ischemia in Mid Anterior, Mid Anteroseptal, Apical Anterior, and Apical Septal regions. EKG negative for ischemia.   CATARACT EXTRACTION Right 04/25/2023   CESAREAN SECTION  1985, 1989   COLON RESECTION Left 01/03/2022   Procedure: COLON RESECTION;  Surgeon: Quentin Ore, MD;  Location: [facility];  Service: General;  Laterality: Left;   COLOSTOMY  01/03/2022   Procedure: COLOSTOMY;  Surgeon: Quentin Ore, MD;  Location: [facility];  Service: General;;   dental implants  11/2002,11/2011,11/2012   dental implants  x 6   DENTAL SURGERY     DILATION AND CURETTAGE OF UTERUS     d and e 1988 and 1990   JOINT REPLACEMENT  09/21/2011   bilateral knee   KNEE ARTHROSCOPY     bilateral   LAPAROTOMY N/A 01/03/2022   Procedure: EXPLORATORY LAPAROTOMY;  Surgeon: Quentin Ore, MD;  Location: [facility];  Service: General;  Laterality: N/A;   lumbar surgery fixation with disc replacement  09/2007   Left   NASAL SEPTOPLASTY W/ TURBINOPLASTY  05/2000   NOCTURNAL POLYSOMNOGRAM  12/31/2006   Severe obstructive sleep apnea. AHI-87/hr   REPLACEMENT TOTAL KNEE  2012   bilateral   TONSILLECTOMY  1952   adenoids too   TOTAL HIP ARTHROPLASTY Left 10/21/2013   Procedure: LEFT TOTAL HIP ARTHROPLASTY ANTERIOR APPROACH;  Surgeon: Loanne Drilling, MD;  Location: [facility];  Service: Orthopedics;  Laterality: Left;   TOTAL HIP ARTHROPLASTY Right 07/16/2014   Procedure: RIGHT TOTAL HIP ARTHROPLASTY ANTERIOR APPROACH;  Surgeon: Loanne Drilling, MD;  Location: [facility];  Service: Orthopedics;  Laterality: Right;   TRANSTHORACIC ECHOCARDIOGRAM  11/09/2007   EF 58%, LV systolic function normal. Mild aortic root dilation.   WRIST SURGERY Left 04/03/2014   ORIF "distal radial head fracture"   XI ROBOTIC ASSISTED COLOSTOMY TAKEDOWN N/A 09/26/2022   Procedure: ROBOTIC OSTOMY REVERSAL;  Surgeon: Quentin Ore, MD;  Location: [facility];  Service: General;  Laterality: N/A;   Patient Active Problem List   Diagnosis Date Noted   Cervical myelopathy (HCC) 12/20/2022   History of creation of ostomy (HCC) 09/26/2022   Colostomy in place (HCC) 01/09/2022   IBS (irritable bowel syndrome) 01/05/2022   Physical deconditioning 01/05/2022   Perforation of colon s/p Hartmann colectomy/colostomy 01/03/2022 01/02/2022   Lumbar post-laminectomy syndrome 10/07/2021   Cervical spondylosis without myelopathy 10/07/2021   Primary osteoarthritis of both feet 06/18/2017   Status post bilateral total hip replacement 06/18/2017   Status post total knee replacement, bilateral 06/18/2017   Degenerative disc disease, lumbar status post fusion 06/18/2017   Degenerative disc disease, cervical 06/18/2017   History of cholelithiasis 06/18/2017   Other chronic pain 02/01/2017   Visit for preventive health examination 01/21/2015   Medicare annual wellness visit, subsequent 01/21/2015   BMI 40.0-44.9, adult (HCC) 01/21/2015   Hyperlipidemia LDL goal <70 01/12/2015   Morbid obesity (HCC) 01/12/2015   Hyperglycemia 01/10/2014   Mild CAD 01/06/2014   Medication monitoring encounter 09/22/2012   Hx of cardiac catheterization    Adenomatous polyp of colon 06/14/2012   ROSACEA 04/26/2010   Obstructive sleep apnea 09/09/2009   Normocytic anemia 12/15/2008    LEUKOPENIA, CHRONIC 01/08/2008   Elevated lipids 08/17/2007   ALLERGIC RHINITIS 08/17/2007   GERD 08/17/2007   Primary osteoarthritis of both hands 08/17/2007    PCP: Madelin Headings, MD   REFERRING PROVIDER: Madelin Headings, MD   REFERRING DIAG: M54.10 (ICD-10-CM) - Back pain with left-sided radiculopathy  G89.29 (ICD-10-CM) - Other chronic pain M15.9 (ICD-10-CM) - Osteoarthritis of multiple joints, unspecified osteoarthritis type   Rationale for Evaluation and Treatment: Rehabilitation  THERAPY DIAG:  Muscle weakness (generalized)  Other abnormalities of gait and mobility  Other low back pain  ONSET DATE: 01/03/23  SUBJECTIVE:  SUBJECTIVE STATEMENT: Pt reports general pain, but that overall, she is doing better.  Pt reports that she has been doing her standing exercises.  PERTINENT HISTORY:  ACDF 12/20/22, Long history of medical and surgical diagnoses. Memory issues, LBP since surgery 2008, fibromyalgia, B hip and knee replacements, R achilles tear 2022  PAIN:  Are you having pain? Yes: NPRS scale: 3-4/10 Pain location: LBP some sciatica B Pain description: ache Aggravating factors: sitting, bending Relieving factors: flexion  PRECAUTIONS: Other: ACDF 12/20/22  RED FLAGS: None   WEIGHT BEARING RESTRICTIONS: No  FALLS:  Has patient fallen in last 6 months? Yes. Number of falls fell in Dr.'s office  3 months ago  LIVING ENVIRONMENT: Lives with: lives with their spouse Lives in: House/apartment Stairs: Yes: Internal: 16 steps; can reach both and External: 3-9 steps; can reach both Has following equipment at home: Single point cane, Walker - 2 wheeled, and Environmental consultant - 4 wheeled  OCCUPATION: retired  PLOF: Independent with household mobility with device  PATIENT GOALS: Would like  to move around without grunting  NEXT MD VISIT: 6-12 months  OBJECTIVE:   DIAGNOSTIC FINDINGS:  N/A   COGNITION: Overall cognitive status:  reports memory issues      SENSATION: WFL  MUSCLE LENGTH:   POSTURE: rounded shoulders, forward head, and flexed trunk   PALPATION: TBD  LUMBAR ROM: *pain  AROM eval  Flexion To knees *  Extension Neutral *  Right lateral flexion 25%  Left lateral flexion 25%  Right rotation 75%  Left rotation 75%   (Blank rows = not tested)  LOWER EXTREMITY ROM:   ER limited B - unable to place foot on opp knee  Active  Right eval Left eval  Hip flexion    Hip extension    Hip abduction    Hip adduction    Hip internal rotation    Hip external rotation    Knee flexion    Knee extension    Ankle dorsiflexion    Ankle plantarflexion    Ankle inversion    Ankle eversion     (Blank rows = not tested)  LOWER EXTREMITY MMT:    MMT Right eval Left eval  Hip flexion 5 4+  Hip extension    Hip abduction    Hip adduction    Hip internal rotation    Hip external rotation    Knee flexion 5 5  Knee extension 5 4+  Ankle dorsiflexion  5 in available range  Ankle plantarflexion    Ankle inversion    Ankle eversion     (Blank rows = not tested)  UPPER EXTREMITY MMT: Shoulder flex and ABD 4+/5, IR/ER 5/5   FUNCTIONAL TESTS:  Eval: 5 times sit to stand: 31 sec Timed up and go (TUG): 15.13 SEC 3 minute walk test: 242 ft - reports increased pain in LB, R hip and L calf after Dynamic Gait Index: TBD  07/28/2023: 5 times sit to stand:  16.39 sec Timed up and go (TUG):  13.47 sec without assistive device 3 minute walk test:  3 minute ambulation: 259 ft with increased back and hip pain as a result (HR of 92 bpm and 97% O2 sats following)  GAIT: Distance walked: 242 ft Assistive device utilized: Single point cane Level of assistance: Modified independence Comments: short step length B, heel strike decreases B as gait  progresses  TODAY'S TREATMENT:  9/9: Pt seen for aquatic therapy today.  Treatment took place in water 3.5-4.75 ft in depth at the [facility] pool. Temp of water was 91.  Pt entered/exited the pool via stairs using step to pattern with hand rail.  *Sup with stair negotiation *Standing 3.6  ft ue support on wall: high knee marching; relaxed squat *Forward walking in 3.8 ft -> 4.2 ue support on barbell x 4 widths then x 2 backward, x 2 side stepping  - short standing rest period. *Standing ue support barbell 4.2 ft: High knee marhihng x 10; hip add/abd 2x5; hip flex 2x5  - ue support on wall relaxed squats x 10 *Seated on 3rd step: hip abd/add x 20; flutter kicking *STS from lift chair x 5 ue support barbell.   Progressed to water bench. Vc for weight shift and immediate standing balance *standing ue support wall: hip add/abd 2 x 5 *L stretch x 3  Pt requires the buoyancy and hydrostatic pressure of water for support, and to offload joints by unweighting joint load by at least 50 % in navel deep water and by at least 75-80% in chest to neck deep water.  Viscosity of the water is needed for resistance of strengthening. Water current perturbations provides challenge to standing balance requiring increased core activation.   DATE:  08/11/2023 Nustep level 3 x6 min with PT present to discuss status Dynamic Gait Index 3/24 Seated:  heel/toe raises, marching, LAQ.  2x10 each bilat Seated hip adduction with ball squeeze 2x10 Seated hip abduction with red tband 2x10 Seated hamstring curl with red tband 2x10 bilat Sit to/from stand 3x5 with UE Standing at barre:  marching, hip abduction, and hip extension.  X10 each bilat     08/02/23 Pt seen for aquatic therapy today.  Treatment took place in water 3.5-4.75 ft in depth at the [facility] pool. Temp of  water was 91.  Pt entered/exited the pool via stairs using step to pattern with hand rail.  *CGA with stair negotiation *Standing 3.6  ft ue support on wall: high knee marching; relaxed squat *Forward walking in 3.8 ft ue support on barbell x 4 widths then x 2 backward, x 2 side stepping  - short standing rest period. *Seated on lift: LAQ; hip add/abd; cycling 2 sets each ~ 1-2 minutes *STS from lift chair x 5 ue support barbell.  Vc for weight shift and immediate standing balance *standing ue support wall: hip add/abd 2 x 5 *L stretch x 3  Pt requires the buoyancy and hydrostatic pressure of water for support, and to offload joints by unweighting joint load by at least 50 % in navel deep water and by at least 75-80% in chest to neck deep water.  Viscosity of the water is needed for resistance of strengthening. Water current perturbations provides challenge to standing balance requiring increased core activation.       07/28/2023: 5 times sit to stand and TUG Nustep level 3 x6 min with PT present to discuss status (HR of 81 bpm and 97% O2 following) 3 minute ambulation: 259 ft with increased back and hip pain as a result (HR of 92 bpm and 97% O2 sats following) Seated:  heel/toe raises, marching, LAQ.   Seated hip adduction with ball squeeze 2x10 Seated hip abduction with red tband 2x10 Seated hamstring curl with red tband 2x10 bilat     PATIENT EDUCATION:  Education details: PT eval findings, anticipated POC, need for further assessment of LE flexibility, strength  and balance, and initial HEP  Person educated: Patient Education method: Explanation, Demonstration, and Handouts Education comprehension: verbalized understanding and returned demonstration  HOME EXERCISE PROGRAM: Access Code: [facility] URL: [email or web address] Date: 08/11/2023 Prepared by: Clydie Braun Menke  Exercises - Sit to Stand with Arms Crossed  - 3 x daily - 3-4 x weekly - 1 sets - 10 reps -  Heel Raises with Counter Support  - 1 x daily - 3-4 x weekly - 1-3 sets - 10 reps - Heel Toe Raises with Counter Support  - 1 x daily - 3-4 x weekly - 1-3 sets - 10 reps - Standing March with Counter Support  - 1 x daily - 3-4 x weekly - 1-3 sets - 10 reps - Standing Hip Abduction with Counter Support  - 1 x daily - 3-4 x weekly - 1-3 sets - 10 reps - Standing Hip Extension with Counter Support  - 1 x daily - 3-4 x weekly - 1-3 sets - 10 reps - Seated March  - 1 x daily - 7 x weekly - 2 sets - 10 reps - Seated Long Arc Quad  - 1 x daily - 7 x weekly - 2 sets - 10 reps - Seated Hip Adduction Isometrics with Ball  - 1 x daily - 7 x weekly - 2 sets - 10 reps - Seated Hip Abduction with Resistance  - 1 x daily - 7 x weekly - 2 sets - 10 reps  ASSESSMENT:  CLINICAL IMPRESSION: Pt with radicular pain into left ankle with amb submerged in 3.8 ft.  Increased depth to 4.2 ft which decreases sx. She progressed with balance completing STS from lower surface ( lift chair -> bench).  Gaining immediate standing balance indep 100% of time.  Improved toleration to seated LE strengthening sitting on 3 step rather than lift chair due to increased submersion. Good progression towards goals.     OBJECTIVE IMPAIRMENTS: Abnormal gait, decreased activity tolerance, decreased balance, decreased ROM, decreased strength, increased muscle spasms, impaired flexibility, postural dysfunction, and pain.   ACTIVITY LIMITATIONS: bending, standing, stairs, and locomotion level  PARTICIPATION LIMITATIONS: driving, shopping, and community activity  PERSONAL FACTORS: Age, Behavior pattern, Time since onset of injury/illness/exacerbation, and 3+ comorbidities: ACDF 12/20/22, Long history of medical and surgical diagnoses. Memory issues, LBP since surgery 2008, fibromyalgia, B hip and knee replacements, R achilles tear 2022  are also affecting patient's functional outcome.   REHAB POTENTIAL: Good  CLINICAL DECISION MAKING:  Evolving/moderate complexity  EVALUATION COMPLEXITY: Moderate   GOALS: Goals reviewed with patient? Yes  SHORT TERM GOALS: Target date: 07/13/2023   Patient will be independent with initial HEP. Baseline:  Goal status: MET  2.  Patient will demonstrate 5XSTS in < = 10 sec demonstrating improved functional strength Baseline: 15.13 sec Goal status: IN PROGRESS (see above)   LONG TERM GOALS: Target date: 09/07/2023  Patient will be independent with advanced/ongoing HEP to improve outcomes and carryover.  Baseline:  Goal status: IN PROGRESS  2.  Patient will be able to ambulate 600' with LRAD with good safety to access community.  Baseline: 242 feet in 3 min Goal status: IN PROGRESS (see above)  3.  Patient will be able to step up/down curb safely with LRAD for safety with community ambulation.  Baseline:  Goal status: INITIAL   4.  Patient will demonstrate improved TUG score to <= 9 sec indicating decreased risk of falls Baseline:  Goal status: IN PROGRESS (see above)  5.  Patient will  demonstrate at least 19/24 on DGI to improve gait stability and reduce risk for falls. Baseline: 3/24 Goal status: IN PROGRESS  6.  Patient will report improved general mobility by 50% or more to improve QOL. Baseline:  Goal status: INITIAL   PLAN:  PT FREQUENCY: 2x/week  PT DURATION: 12 weeks Patient will be on vacation until the week of 07/10/23  PLANNED INTERVENTIONS: Therapeutic exercises, Therapeutic activity, Neuromuscular re-education, Balance training, Gait training, Patient/Family education, Self Care, Joint mobilization, Stair training, Aquatic Therapy, Dry Needling, Electrical stimulation, Spinal mobilization, Cryotherapy, Moist heat, and Manual therapy.  PLAN FOR NEXT SESSION: Aquatic PT.  Land: flexibility deficits and LE strength. Work on general strengthening for LE and core, balance, gait.   Cathey Fredenburg (Frankie) Chanteria Haggard MPT 08/14/23, 11:15 AM

## 2023-08-16 ENCOUNTER — Ambulatory Visit (HOSPITAL_BASED_OUTPATIENT_CLINIC_OR_DEPARTMENT_OTHER): Payer: Medicare Other | Admitting: Physical Therapy

## 2023-08-18 ENCOUNTER — Encounter: Payer: Medicare Other | Admitting: Rehabilitative and Restorative Service Providers"

## 2023-08-23 ENCOUNTER — Ambulatory Visit (HOSPITAL_BASED_OUTPATIENT_CLINIC_OR_DEPARTMENT_OTHER): Payer: Medicare Other | Admitting: Physical Therapy

## 2023-08-23 ENCOUNTER — Encounter (HOSPITAL_BASED_OUTPATIENT_CLINIC_OR_DEPARTMENT_OTHER): Payer: Self-pay | Admitting: Physical Therapy

## 2023-08-23 DIAGNOSIS — M5459 Other low back pain: Secondary | ICD-10-CM

## 2023-08-23 DIAGNOSIS — G4733 Obstructive sleep apnea (adult) (pediatric): Secondary | ICD-10-CM | POA: Diagnosis not present

## 2023-08-23 DIAGNOSIS — Z9181 History of falling: Secondary | ICD-10-CM | POA: Diagnosis not present

## 2023-08-23 DIAGNOSIS — M6281 Muscle weakness (generalized): Secondary | ICD-10-CM | POA: Diagnosis not present

## 2023-08-23 DIAGNOSIS — R2689 Other abnormalities of gait and mobility: Secondary | ICD-10-CM

## 2023-08-23 NOTE — Therapy (Signed)
OUTPATIENT PHYSICAL THERAPY TREATMENT NOTE   Patient Name: Terri Rodriguez MRN: [id number] DOB:22-Dec-1945, 77 y.o., female Today's Date: 08/23/2023  END OF SESSION:  PT End of Session - 08/23/23 1342     Visit Number 6    Date for PT Re-Evaluation 09/07/23    Authorization Type BC[employer] MCR    Progress Note Due on Visit 10    PT Start Time 1335    PT Stop Time 1415    PT Time Calculation (min) 40 min    Activity Tolerance Patient tolerated treatment well    Behavior During Therapy WFL for tasks assessed/performed              Past Medical History:  Diagnosis Date   ALLERGIC RHINITIS    Allergy    Anginal pain (HCC)    Blood transfusion without reported diagnosis    Cataract    small right   Cholelithiasis 06/09/2008   gallstones 2009 on ct scan   Complication of anesthesia 2012   went to ICU after bilateral knees , unstable vital signs oct 2012, has surgery since did ok   Coronary artery disease    30 % narrowing lad   Diverticulitis    EPICONDYLITIS, LATERAL    osteoarthritis, previous joint replacements   Fall 10/2018   GERD    Hepatic hemangioma 06/09/2008   Hiatal hernia 03/2000   Hx of adenomatous polyp of colon 06/05/2008   Hx of cardiac catheterization 2008   clean coronarys DrKelly    Hx of chest pain 2003   neg cath remot hx of narrowwing lad in 2003 nl 2008, none in many years   HYPERLIPIDEMIA    IBS (irritable bowel syndrome)    LEUKOPENIA, CHRONIC    Mononucleosis 1963   RAYNAUD'S SYNDROME, HX OF    improved after cardiac meds initiated   Rosacea    facial   Sleep apnea    SLEEP APNEA, OBSTRUCTIVE    cpap, settings "automatic" settings 11-12   Subacute thyroiditis    UNSPECIFIED ANEMIA    Past Surgical History:  Procedure Laterality Date   ABDOMINAL HYSTERECTOMY  1993   bso   ANTERIOR CERVICAL DECOMP/DISCECTOMY FUSION N/A 12/20/2022   Procedure: Cervical four-five Cervical five-six Cervical six-seven Anterior Cervical Decompression Fusion;   Surgeon: Barnett Abu, MD;  Location: [facility];  Service: Neurosurgery;  Laterality: N/A;   ANTERIOR FUSION CERVICAL SPINE N/A 12/20/2022   BACK SURGERY  2008   l 3 to l4 l4 to l5   BARTHOLIN GLAND CYST EXCISION Right 09/09/2019   Procedure: EXCISION OF VULVAR MASS TIMES TWO;  Surgeon: Jerene Bears, MD;  Location: [facility];  Service: Gynecology;  Laterality: Right;   CARDIAC CATHETERIZATION  04/07/2007   Noncritical coronary artery disease. Contiue medical therapy.   CARDIAC CATHETERIZATION  12/03/2007   Normal LV function. Mild angiographic mitral valve prolapse. Normal coronary arteries.   CARDIOVASCULAR STRESS TEST  11/09/2007   Moderate ischemia in Mid Anterior, Mid Anteroseptal, Apical Anterior, and Apical Septal regions. EKG negative for ischemia.   CATARACT EXTRACTION Right 04/25/2023   CESAREAN SECTION  1985, 1989   COLON RESECTION Left 01/03/2022   Procedure: COLON RESECTION;  Surgeon: Quentin Ore, MD;  Location: [facility];  Service: General;  Laterality: Left;   COLOSTOMY  01/03/2022   Procedure: COLOSTOMY;  Surgeon: Quentin Ore, MD;  Location: [facility];  Service: General;;   dental implants  11/2002,11/2011,11/2012   dental implants  x 6   DENTAL SURGERY     DILATION AND CURETTAGE OF UTERUS     d and e 1988 and 1990   JOINT REPLACEMENT  09/21/2011   bilateral knee   KNEE ARTHROSCOPY     bilateral   LAPAROTOMY N/A 01/03/2022   Procedure: EXPLORATORY LAPAROTOMY;  Surgeon: Quentin Ore, MD;  Location: [facility];  Service: General;  Laterality: N/A;   lumbar surgery fixation with disc replacement  09/2007   Left   NASAL SEPTOPLASTY W/ TURBINOPLASTY  05/2000   NOCTURNAL POLYSOMNOGRAM  12/31/2006   Severe obstructive sleep apnea. AHI-87/hr   REPLACEMENT TOTAL KNEE  2012   bilateral   TONSILLECTOMY  1952   adenoids too   TOTAL HIP ARTHROPLASTY Left 10/21/2013   Procedure: LEFT TOTAL HIP ARTHROPLASTY ANTERIOR APPROACH;  Surgeon: Loanne Drilling, MD;  Location: [facility];  Service: Orthopedics;  Laterality: Left;   TOTAL HIP ARTHROPLASTY Right 07/16/2014   Procedure: RIGHT TOTAL HIP ARTHROPLASTY ANTERIOR APPROACH;  Surgeon: Loanne Drilling, MD;  Location: [facility];  Service: Orthopedics;  Laterality: Right;   TRANSTHORACIC ECHOCARDIOGRAM  11/09/2007   EF 58%, LV systolic function normal. Mild aortic root dilation.   WRIST SURGERY Left 04/03/2014   ORIF "distal radial head fracture"   XI ROBOTIC ASSISTED COLOSTOMY TAKEDOWN N/A 09/26/2022   Procedure: ROBOTIC OSTOMY REVERSAL;  Surgeon: Quentin Ore, MD;  Location: [facility];  Service: General;  Laterality: N/A;   Patient Active Problem List   Diagnosis Date Noted   Cervical myelopathy (HCC) 12/20/2022   History of creation of ostomy (HCC) 09/26/2022   Colostomy in place (HCC) 01/09/2022   IBS (irritable bowel syndrome) 01/05/2022   Physical deconditioning 01/05/2022   Perforation of colon s/p Hartmann colectomy/colostomy 01/03/2022 01/02/2022   Lumbar post-laminectomy syndrome 10/07/2021   Cervical spondylosis without myelopathy 10/07/2021   Primary osteoarthritis of both feet 06/18/2017   Status post bilateral total hip replacement 06/18/2017   Status post total knee replacement, bilateral 06/18/2017   Degenerative disc disease, lumbar status post fusion 06/18/2017   Degenerative disc disease, cervical 06/18/2017   History of cholelithiasis 06/18/2017   Other chronic pain 02/01/2017   Visit for preventive health examination 01/21/2015   Medicare annual wellness visit, subsequent 01/21/2015   BMI 40.0-44.9, adult (HCC) 01/21/2015   Hyperlipidemia LDL goal <70 01/12/2015   Morbid obesity (HCC) 01/12/2015   Hyperglycemia 01/10/2014   Mild CAD 01/06/2014   Medication monitoring encounter 09/22/2012   Hx of cardiac catheterization    Adenomatous polyp of colon 06/14/2012   ROSACEA 04/26/2010   Obstructive sleep apnea 09/09/2009   Normocytic anemia 12/15/2008    LEUKOPENIA, CHRONIC 01/08/2008   Elevated lipids 08/17/2007   ALLERGIC RHINITIS 08/17/2007   GERD 08/17/2007   Primary osteoarthritis of both hands 08/17/2007    PCP: Madelin Headings, MD   REFERRING PROVIDER: Madelin Headings, MD   REFERRING DIAG: M54.10 (ICD-10-CM) - Back pain with left-sided radiculopathy  G89.29 (ICD-10-CM) - Other chronic pain M15.9 (ICD-10-CM) - Osteoarthritis of multiple joints, unspecified osteoarthritis type   Rationale for Evaluation and Treatment: Rehabilitation  THERAPY DIAG:  Muscle weakness (generalized)  Other abnormalities of gait and mobility  Other low back pain  ONSET DATE: 01/03/23  SUBJECTIVE:  SUBJECTIVE STATEMENT: Pt reports general pain, but that overall, she is doing better.  Pt reports that she has been doing her standing exercises.  PERTINENT HISTORY:  ACDF 12/20/22, Long history of medical and surgical diagnoses. Memory issues, LBP since surgery 2008, fibromyalgia, B hip and knee replacements, R achilles tear 2022  PAIN:  Are you having pain? Yes: NPRS scale: 3-4/10 Pain location: LBP some sciatica B Pain description: ache Aggravating factors: sitting, bending Relieving factors: flexion  PRECAUTIONS: Other: ACDF 12/20/22  RED FLAGS: None   WEIGHT BEARING RESTRICTIONS: No  FALLS:  Has patient fallen in last 6 months? Yes. Number of falls fell in Dr.'s office  3 months ago  LIVING ENVIRONMENT: Lives with: lives with their spouse Lives in: House/apartment Stairs: Yes: Internal: 16 steps; can reach both and External: 3-9 steps; can reach both Has following equipment at home: Single point cane, Walker - 2 wheeled, and Environmental consultant - 4 wheeled  OCCUPATION: retired  PLOF: Independent with household mobility with device  PATIENT GOALS: Would like  to move around without grunting  NEXT MD VISIT: 6-12 months  OBJECTIVE:   DIAGNOSTIC FINDINGS:  N/A   COGNITION: Overall cognitive status:  reports memory issues      SENSATION: WFL  MUSCLE LENGTH:   POSTURE: rounded shoulders, forward head, and flexed trunk   PALPATION: TBD  LUMBAR ROM: *pain  AROM eval  Flexion To knees *  Extension Neutral *  Right lateral flexion 25%  Left lateral flexion 25%  Right rotation 75%  Left rotation 75%   (Blank rows = not tested)  LOWER EXTREMITY ROM:   ER limited B - unable to place foot on opp knee  Active  Right eval Left eval  Hip flexion    Hip extension    Hip abduction    Hip adduction    Hip internal rotation    Hip external rotation    Knee flexion    Knee extension    Ankle dorsiflexion    Ankle plantarflexion    Ankle inversion    Ankle eversion     (Blank rows = not tested)  LOWER EXTREMITY MMT:    MMT Right eval Left eval  Hip flexion 5 4+  Hip extension    Hip abduction    Hip adduction    Hip internal rotation    Hip external rotation    Knee flexion 5 5  Knee extension 5 4+  Ankle dorsiflexion  5 in available range  Ankle plantarflexion    Ankle inversion    Ankle eversion     (Blank rows = not tested)  UPPER EXTREMITY MMT: Shoulder flex and ABD 4+/5, IR/ER 5/5   FUNCTIONAL TESTS:  Eval: 5 times sit to stand: 31 sec Timed up and go (TUG): 15.13 SEC 3 minute walk test: 242 ft - reports increased pain in LB, R hip and L calf after Dynamic Gait Index: TBD  07/28/2023: 5 times sit to stand:  16.39 sec Timed up and go (TUG):  13.47 sec without assistive device 3 minute walk test:  3 minute ambulation: 259 ft with increased back and hip pain as a result (HR of 92 bpm and 97% O2 sats following)  GAIT: Distance walked: 242 ft Assistive device utilized: Single point cane Level of assistance: Modified independence Comments: short step length B, heel strike decreases B as gait  progresses  TODAY'S TREATMENT:  08/23/23 Pt seen for aquatic therapy today.  Treatment took place in water 3.5-4.75 ft in depth at the [facility] pool. Temp of water was 91.  Pt entered/exited the pool via stairs using step to pattern with hand rail.  *walking ue support barbell forward and backward x 4 widths; side ways x 2 *decompression on yellow noodle; beginning cycling; UE support wall hip add/abd *Standing 4.0  ft ue support on wall: high knee marching; relaxed squat; df; pf; x 10-12; hip abd 2 x 5; high knee marching x 10; hamstring curls x 10  *Seated on 3rd step: hip abd/add x 20; flutter kicking *STS from lift chair x 5 ue support barbell.   Progressed to water bench. Vc for weight shift and immediate standing balance *standing ue support wall: hip add/abd 2 x 5 *L stretch x 3  Pt requires the buoyancy and hydrostatic pressure of water for support, and to offload joints by unweighting joint load by at least 50 % in navel deep water and by at least 75-80% in chest to neck deep water.  Viscosity of the water is needed for resistance of strengthening. Water current perturbations provides challenge to standing balance requiring increased core activation.     DATE:  08/11/2023 Nustep level 3 x6 min with PT present to discuss status Dynamic Gait Index 3/24 Seated:  heel/toe raises, marching, LAQ.  2x10 each bilat Seated hip adduction with ball squeeze 2x10 Seated hip abduction with red tband 2x10 Seated hamstring curl with red tband 2x10 bilat Sit to/from stand 3x5 with UE Standing at barre:  marching, hip abduction, and hip extension.  X10 each bilat       07/28/2023: 5 times sit to stand and TUG Nustep level 3 x6 min with PT present to discuss status (HR of 81 bpm and 97% O2 following) 3 minute ambulation: 259 ft with increased back and hip  pain as a result (HR of 92 bpm and 97% O2 sats following) Seated:  heel/toe raises, marching, LAQ.   Seated hip adduction with ball squeeze 2x10 Seated hip abduction with red tband 2x10 Seated hamstring curl with red tband 2x10 bilat     PATIENT EDUCATION:  Education details: PT eval findings, anticipated POC, need for further assessment of LE flexibility, strength and balance, and initial HEP  Person educated: Patient Education method: Explanation, Demonstration, and Handouts Education comprehension: verbalized understanding and returned demonstration  HOME EXERCISE PROGRAM: Access Code: [facility] URL: [email or web address] Date: 08/11/2023 Prepared by: Clydie Braun Menke  Exercises - Sit to Stand with Arms Crossed  - 3 x daily - 3-4 x weekly - 1 sets - 10 reps - Heel Raises with Counter Support  - 1 x daily - 3-4 x weekly - 1-3 sets - 10 reps - Heel Toe Raises with Counter Support  - 1 x daily - 3-4 x weekly - 1-3 sets - 10 reps - Standing March with Counter Support  - 1 x daily - 3-4 x weekly - 1-3 sets - 10 reps - Standing Hip Abduction with Counter Support  - 1 x daily - 3-4 x weekly - 1-3 sets - 10 reps - Standing Hip Extension with Counter Support  - 1 x daily - 3-4 x weekly - 1-3 sets - 10 reps - Seated March  - 1 x daily - 7 x weekly - 2 sets - 10 reps - Seated Long Arc Quad  - 1 x daily - 7 x weekly - 2 sets - 10 reps - Seated  Hip Adduction Isometrics with Ball  - 1 x daily - 7 x weekly - 2 sets - 10 reps - Seated Hip Abduction with Resistance  - 1 x daily - 7 x weekly - 2 sets - 10 reps  ASSESSMENT:  CLINICAL IMPRESSION: Pt requires submersion of 67ft or deeper to resolve radicular pain with activity.   She has improved balance further completing STS with increased loading from bench onto water step. She does have some hesitation initially but rises 100% of time without LOB.  Pain centralizes during session with overall reduction in pain by 2 points.  Goals  ongoing       OBJECTIVE IMPAIRMENTS: Abnormal gait, decreased activity tolerance, decreased balance, decreased ROM, decreased strength, increased muscle spasms, impaired flexibility, postural dysfunction, and pain.   ACTIVITY LIMITATIONS: bending, standing, stairs, and locomotion level  PARTICIPATION LIMITATIONS: driving, shopping, and community activity  PERSONAL FACTORS: Age, Behavior pattern, Time since onset of injury/illness/exacerbation, and 3+ comorbidities: ACDF 12/20/22, Long history of medical and surgical diagnoses. Memory issues, LBP since surgery 2008, fibromyalgia, B hip and knee replacements, R achilles tear 2022  are also affecting patient's functional outcome.   REHAB POTENTIAL: Good  CLINICAL DECISION MAKING: Evolving/moderate complexity  EVALUATION COMPLEXITY: Moderate   GOALS: Goals reviewed with patient? Yes  SHORT TERM GOALS: Target date: 07/13/2023   Patient will be independent with initial HEP. Baseline:  Goal status: MET  2.  Patient will demonstrate 5XSTS in < = 10 sec demonstrating improved functional strength Baseline: 15.13 sec Goal status: IN PROGRESS (see above)   LONG TERM GOALS: Target date: 09/07/2023  Patient will be independent with advanced/ongoing HEP to improve outcomes and carryover.  Baseline:  Goal status: IN PROGRESS  2.  Patient will be able to ambulate 600' with LRAD with good safety to access community.  Baseline: 242 feet in 3 min Goal status: IN PROGRESS (see above)  3.  Patient will be able to step up/down curb safely with LRAD for safety with community ambulation.  Baseline:  Goal status: INITIAL   4.  Patient will demonstrate improved TUG score to <= 9 sec indicating decreased risk of falls Baseline:  Goal status: IN PROGRESS (see above)  5.  Patient will demonstrate at least 19/24 on DGI to improve gait stability and reduce risk for falls. Baseline: 3/24 Goal status: IN PROGRESS  6.  Patient will report improved  general mobility by 50% or more to improve QOL. Baseline:  Goal status: INITIAL   PLAN:  PT FREQUENCY: 2x/week  PT DURATION: 12 weeks Patient will be on vacation until the week of 07/10/23  PLANNED INTERVENTIONS: Therapeutic exercises, Therapeutic activity, Neuromuscular re-education, Balance training, Gait training, Patient/Family education, Self Care, Joint mobilization, Stair training, Aquatic Therapy, Dry Needling, Electrical stimulation, Spinal mobilization, Cryotherapy, Moist heat, and Manual therapy.  PLAN FOR NEXT SESSION: Aquatic PT.  Land: flexibility deficits and LE strength. Work on general strengthening for LE and core, balance, gait.   Terri Rodriguez MPT 08/23/23, 1:43 PM

## 2023-08-25 ENCOUNTER — Ambulatory Visit: Payer: Medicare Other | Admitting: Physical Therapy

## 2023-08-25 ENCOUNTER — Encounter: Payer: Self-pay | Admitting: Physical Therapy

## 2023-08-25 DIAGNOSIS — M6281 Muscle weakness (generalized): Secondary | ICD-10-CM | POA: Diagnosis not present

## 2023-08-25 DIAGNOSIS — Z9181 History of falling: Secondary | ICD-10-CM

## 2023-08-25 DIAGNOSIS — M545 Low back pain, unspecified: Secondary | ICD-10-CM | POA: Diagnosis not present

## 2023-08-25 DIAGNOSIS — M5459 Other low back pain: Secondary | ICD-10-CM

## 2023-08-25 DIAGNOSIS — M961 Postlaminectomy syndrome, not elsewhere classified: Secondary | ICD-10-CM

## 2023-08-25 DIAGNOSIS — R2689 Other abnormalities of gait and mobility: Secondary | ICD-10-CM

## 2023-08-25 DIAGNOSIS — R262 Difficulty in walking, not elsewhere classified: Secondary | ICD-10-CM

## 2023-08-25 NOTE — Therapy (Signed)
OUTPATIENT PHYSICAL THERAPY TREATMENT NOTE   Patient Name: Terri Rodriguez MRN: [id number] DOB:[birth year], 77 y.o., female Today's Date: 08/25/2023  END OF SESSION:  PT End of Session - 08/25/23 1150     Visit Number 7    Date for PT Re-Evaluation 09/07/23    Authorization Type [employer] MCR    PT Start Time 1101    PT Stop Time 1143    PT Time Calculation (min) 42 min    Activity Tolerance Patient tolerated treatment well    Behavior During Therapy WFL for tasks assessed/performed               Past Medical History:  Diagnosis Date   ALLERGIC RHINITIS    Allergy    Anginal pain (HCC)    Blood transfusion without reported diagnosis    Cataract    small right   Cholelithiasis 06/09/2008   gallstones 2009 on ct scan   Complication of anesthesia 2012   went to ICU after bilateral knees , unstable vital signs oct 2012, has surgery since did ok   Coronary artery disease    30 % narrowing lad   Diverticulitis    EPICONDYLITIS, LATERAL    osteoarthritis, previous joint replacements   Fall 10/2018   GERD    Hepatic hemangioma 06/09/2008   Hiatal hernia 03/2000   Hx of adenomatous polyp of colon 06/05/2008   Hx of cardiac catheterization 2008   clean coronarys DrKelly    Hx of chest pain 2003   neg cath remot hx of narrowwing lad in 2003 nl 2008, none in many years   HYPERLIPIDEMIA    IBS (irritable bowel syndrome)    LEUKOPENIA, CHRONIC    Mononucleosis 1963   RAYNAUD'S SYNDROME, HX OF    improved after cardiac meds initiated   Rosacea    facial   Sleep apnea    SLEEP APNEA, OBSTRUCTIVE    cpap, settings "automatic" settings 11-12   Subacute thyroiditis    UNSPECIFIED ANEMIA    Past Surgical History:  Procedure Laterality Date   ABDOMINAL HYSTERECTOMY  1993   bso   ANTERIOR CERVICAL DECOMP/DISCECTOMY FUSION N/A 12/20/2022   Procedure: Cervical four-five Cervical five-six Cervical six-seven Anterior Cervical Decompression Fusion;  Surgeon: Barnett Abu, MD;   Location: [facility];  Service: Neurosurgery;  Laterality: N/A;   ANTERIOR FUSION CERVICAL SPINE N/A 12/20/2022   BACK SURGERY  2008   l 3 to l4 l4 to l5   BARTHOLIN GLAND CYST EXCISION Right 09/09/2019   Procedure: EXCISION OF VULVAR MASS TIMES TWO;  Surgeon: Jerene Bears, MD;  Location: [facility];  Service: Gynecology;  Laterality: Right;   CARDIAC CATHETERIZATION  04/07/2007   Noncritical coronary artery disease. Contiue medical therapy.   CARDIAC CATHETERIZATION  12/03/2007   Normal LV function. Mild angiographic mitral valve prolapse. Normal coronary arteries.   CARDIOVASCULAR STRESS TEST  11/09/2007   Moderate ischemia in Mid Anterior, Mid Anteroseptal, Apical Anterior, and Apical Septal regions. EKG negative for ischemia.   CATARACT EXTRACTION Right 04/25/2023   CESAREAN SECTION  1985, 1989   COLON RESECTION Left 01/03/2022   Procedure: COLON RESECTION;  Surgeon: Quentin Ore, MD;  Location: [facility];  Service: General;  Laterality: Left;   COLOSTOMY  01/03/2022   Procedure: COLOSTOMY;  Surgeon: Quentin Ore, MD;  Location: [facility];  Service: General;;   dental implants  11/2002,11/2011,11/2012   dental implants     x 6   DENTAL SURGERY  DILATION AND CURETTAGE OF UTERUS     d and e 1988 and 1990   JOINT REPLACEMENT  09/21/2011   bilateral knee   KNEE ARTHROSCOPY     bilateral   LAPAROTOMY N/A 01/03/2022   Procedure: EXPLORATORY LAPAROTOMY;  Surgeon: Quentin Ore, MD;  Location: [facility];  Service: General;  Laterality: N/A;   lumbar surgery fixation with disc replacement  09/2007   Left   NASAL SEPTOPLASTY W/ TURBINOPLASTY  05/2000   NOCTURNAL POLYSOMNOGRAM  12/31/2006   Severe obstructive sleep apnea. AHI-87/hr   REPLACEMENT TOTAL KNEE  2012   bilateral   TONSILLECTOMY  1952   adenoids too   TOTAL HIP ARTHROPLASTY Left 10/21/2013   Procedure: LEFT TOTAL HIP ARTHROPLASTY ANTERIOR APPROACH;  Surgeon: Loanne Drilling, MD;  Location: [facility];  Service: Orthopedics;  Laterality: Left;   TOTAL HIP ARTHROPLASTY Right 07/16/2014   Procedure: RIGHT TOTAL HIP ARTHROPLASTY ANTERIOR APPROACH;  Surgeon: Loanne Drilling, MD;  Location: [facility];  Service: Orthopedics;  Laterality: Right;   TRANSTHORACIC ECHOCARDIOGRAM  11/09/2007   EF 58%, LV systolic function normal. Mild aortic root dilation.   WRIST SURGERY Left 04/03/2014   ORIF "distal radial head fracture"   XI ROBOTIC ASSISTED COLOSTOMY TAKEDOWN N/A 09/26/2022   Procedure: ROBOTIC OSTOMY REVERSAL;  Surgeon: Quentin Ore, MD;  Location: [facility];  Service: General;  Laterality: N/A;   Patient Active Problem List   Diagnosis Date Noted   Cervical myelopathy (HCC) 12/20/2022   History of creation of ostomy (HCC) 09/26/2022   Colostomy in place (HCC) 01/09/2022   IBS (irritable bowel syndrome) 01/05/2022   Physical deconditioning 01/05/2022   Perforation of colon s/p Hartmann colectomy/colostomy 01/03/2022 01/02/2022   Lumbar post-laminectomy syndrome 10/07/2021   Cervical spondylosis without myelopathy 10/07/2021   Primary osteoarthritis of both feet 06/18/2017   Status post bilateral total hip replacement 06/18/2017   Status post total knee replacement, bilateral 06/18/2017   Degenerative disc disease, lumbar status post fusion 06/18/2017   Degenerative disc disease, cervical 06/18/2017   History of cholelithiasis 06/18/2017   Other chronic pain 02/01/2017   Visit for preventive health examination 01/21/2015   Medicare annual wellness visit, subsequent 01/21/2015   BMI 40.0-44.9, adult (HCC) 01/21/2015   Hyperlipidemia LDL goal <70 01/12/2015   Morbid obesity (HCC) 01/12/2015   Hyperglycemia 01/10/2014   Mild CAD 01/06/2014   Medication monitoring encounter 09/22/2012   Hx of cardiac catheterization    Adenomatous polyp of colon 06/14/2012   ROSACEA 04/26/2010   Obstructive sleep apnea 09/09/2009   Normocytic anemia 12/15/2008   LEUKOPENIA, CHRONIC 01/08/2008    Elevated lipids 08/17/2007   ALLERGIC RHINITIS 08/17/2007   GERD 08/17/2007   Primary osteoarthritis of both hands 08/17/2007    PCP: Madelin Headings, MD   REFERRING PROVIDER: Madelin Headings, MD   REFERRING DIAG: M54.10 (ICD-10-CM) - Back pain with left-sided radiculopathy  G89.29 (ICD-10-CM) - Other chronic pain M15.9 (ICD-10-CM) - Osteoarthritis of multiple joints, unspecified osteoarthritis type   Rationale for Evaluation and Treatment: Rehabilitation  THERAPY DIAG:  Muscle weakness (generalized)  History of falling  Other abnormalities of gait and mobility  Other low back pain  Difficulty in walking, not elsewhere classified  Pain in left lumbar region of back  Lumbar post-laminectomy syndrome  ONSET DATE: 01/03/23  SUBJECTIVE:  SUBJECTIVE STATEMENT: Patient has had a rough few days. Her power went out yesterday and she was not able to sleep with her sleep apnea machine. Her back is not bothering her 2/10 pain.  PERTINENT HISTORY:  ACDF 12/20/22, Long history of medical and surgical diagnoses. Memory issues, LBP since surgery 2008, fibromyalgia, B hip and knee replacements, R achilles tear 2022  PAIN:  Are you having pain? Yes: NPRS scale: 2/10 Pain location: LBP some sciatica B Pain description: ache Aggravating factors: sitting, bending Relieving factors: flexion  PRECAUTIONS: Other: ACDF 12/20/22  RED FLAGS: None   WEIGHT BEARING RESTRICTIONS: No  FALLS:  Has patient fallen in last 6 months? Yes. Number of falls fell in Dr.'s office  3 months ago  LIVING ENVIRONMENT: Lives with: lives with their spouse Lives in: House/apartment Stairs: Yes: Internal: 16 steps; can reach both and External: 3-9 steps; can reach both Has following equipment at home: Single point cane,  Walker - 2 wheeled, and Environmental consultant - 4 wheeled  OCCUPATION: retired physician  PLOF: Independent with household mobility with device  PATIENT GOALS: Would like to move around without grunting  NEXT MD VISIT: 6-12 months  OBJECTIVE:   DIAGNOSTIC FINDINGS:  N/A   COGNITION: Overall cognitive status:  reports memory issues      SENSATION: WFL  MUSCLE LENGTH:   POSTURE: rounded shoulders, forward head, and flexed trunk   PALPATION: TBD  LUMBAR ROM: *pain  AROM eval  Flexion To knees *  Extension Neutral *  Right lateral flexion 25%  Left lateral flexion 25%  Right rotation 75%  Left rotation 75%   (Blank rows = not tested)  LOWER EXTREMITY ROM:   ER limited B - unable to place foot on opp knee  Active  Right eval Left eval  Hip flexion    Hip extension    Hip abduction    Hip adduction    Hip internal rotation    Hip external rotation    Knee flexion    Knee extension    Ankle dorsiflexion    Ankle plantarflexion    Ankle inversion    Ankle eversion     (Blank rows = not tested)  LOWER EXTREMITY MMT:    MMT Right eval Left eval  Hip flexion 5 4+  Hip extension    Hip abduction    Hip adduction    Hip internal rotation    Hip external rotation    Knee flexion 5 5  Knee extension 5 4+  Ankle dorsiflexion  5 in available range  Ankle plantarflexion    Ankle inversion    Ankle eversion     (Blank rows = not tested)  UPPER EXTREMITY MMT: Shoulder flex and ABD 4+/5, IR/ER 5/5   FUNCTIONAL TESTS:  Eval: 5 times sit to stand: 31 sec Timed up and go (TUG): 15.13 SEC 3 minute walk test: 242 ft - reports increased pain in LB, R hip and L calf after Dynamic Gait Index: TBD  07/28/2023: 5 times sit to stand:  16.39 sec Timed up and go (TUG):  13.47 sec without assistive device 3 minute walk test:  3 minute ambulation: 259 ft with increased back and hip pain as a result (HR of 92 bpm and 97% O2 sats following)                       08/25/23 4  MWT: 334.29ft ; used single point cane for the last 3 minutes GAIT:  Distance walked: 242 ft Assistive device utilized: Single point cane Level of assistance: Modified independence Comments: short step length B, heel strike decreases B as gait progresses  TODAY'S TREATMENT:                                                                                                                              08/25/23 Nustep level 3 x7 min with PT present to discuss status Seated Marching 2x10 Seated hip adduction with ball squeeze 2x10 Seated hip abduction with red tband 2x10 4 MWT 334.43ft ; used single point cane for the last 3 minutes Sit to Stand no UE 2x10  Step Taps 6 inch step x20 Standing L at [facility] Seated lateral stretch with peanut stability ball    08/23/23 Pt seen for aquatic therapy today.  Treatment took place in water 3.5-4.75 ft in depth at the [facility] pool. Temp of water was 91.  Pt entered/exited the pool via stairs using step to pattern with hand rail.  *walking ue support barbell forward and backward x 4 widths; side ways x 2 *decompression on yellow noodle; beginning cycling; UE support wall hip add/abd *Standing 4.0  ft ue support on wall: high knee marching; relaxed squat; df; pf; x 10-12; hip abd 2 x 5; high knee marching x 10; hamstring curls x 10  *Seated on 3rd step: hip abd/add x 20; flutter kicking *STS from lift chair x 5 ue support barbell.   Progressed to water bench. Vc for weight shift and immediate standing balance *standing ue support wall: hip add/abd 2 x 5 *L stretch x 3  Pt requires the buoyancy and hydrostatic pressure of water for support, and to offload joints by unweighting joint load by at least 50 % in navel deep water and by at least 75-80% in chest to neck deep water.  Viscosity of the water is needed for resistance of strengthening. Water current perturbations provides challenge to standing balance requiring increased core  activation.    DATE:  08/11/2023 Nustep level 3 x6 min with PT present to discuss status Dynamic Gait Index 3/24 Seated:  heel/toe raises, marching, LAQ.  2x10 each bilat Seated hip adduction with ball squeeze 2x10 Seated hip abduction with red tband 2x10 Seated hamstring curl with red tband 2x10 bilat Sit to/from stand 3x5 with UE Standing at barre:  marching, hip abduction, and hip extension.  X10 each bilat     PATIENT EDUCATION:  Education details: PT eval findings, anticipated POC, need for further assessment of LE flexibility, strength and balance, and initial HEP  Person educated: Patient Education method: Explanation, Demonstration, and Handouts Education comprehension: verbalized understanding and returned demonstration  HOME EXERCISE PROGRAM: Access Code: [facility] URL: [email or web address] Date: 08/25/2023 Prepared by: Claude Manges  Exercises - Sit to Stand with Arms Crossed  - 3 x daily - 3-4 x weekly - 1 sets - 10 reps - Heel Raises with Counter Support  - 1 x daily -  3-4 x weekly - 1-3 sets - 10 reps - Heel Toe Raises with Counter Support  - 1 x daily - 3-4 x weekly - 1-3 sets - 10 reps - Standing March with Counter Support  - 1 x daily - 3-4 x weekly - 1-3 sets - 10 reps - Standing Hip Abduction with Counter Support  - 1 x daily - 3-4 x weekly - 1-3 sets - 10 reps - Standing Hip Extension with Counter Support  - 1 x daily - 3-4 x weekly - 1-3 sets - 10 reps - Seated March  - 1 x daily - 7 x weekly - 2 sets - 10 reps - Seated Long Arc Quad  - 1 x daily - 7 x weekly - 2 sets - 10 reps - Seated Hip Adduction Isometrics with Ball  - 1 x daily - 7 x weekly - 2 sets - 10 reps - Seated Hip Abduction with Resistance  - 1 x daily - 7 x weekly - 2 sets - 10 reps - Standing 'L' Stretch at Counter  - 1 x daily - 7 x weekly - 2 sets - 10 reps  ASSESSMENT:  CLINICAL IMPRESSION: Today's treatment session focused on LE strengthening and improving mobility.  Patient was able to tolerate walking 4 minutes and covered 334.9 ft. She used her straight point cane for the last 3 minutes of the test. Her back started hurting which limited her from walking further. While performing standing step taps patient caught foot on step twice but was able to self recovery balance. Added standing L at counter to patient's HEP. Patient tolerated treatment session well. Patient will benefit from skilled PT to address the below impairments and improve overall function.      OBJECTIVE IMPAIRMENTS: Abnormal gait, decreased activity tolerance, decreased balance, decreased ROM, decreased strength, increased muscle spasms, impaired flexibility, postural dysfunction, and pain.   ACTIVITY LIMITATIONS: bending, standing, stairs, and locomotion level  PARTICIPATION LIMITATIONS: driving, shopping, and community activity  PERSONAL FACTORS: Age, Behavior pattern, Time since onset of injury/illness/exacerbation, and 3+ comorbidities: ACDF 12/20/22, Long history of medical and surgical diagnoses. Memory issues, LBP since surgery 2008, fibromyalgia, B hip and knee replacements, R achilles tear 2022  are also affecting patient's functional outcome.   REHAB POTENTIAL: Good  CLINICAL DECISION MAKING: Evolving/moderate complexity  EVALUATION COMPLEXITY: Moderate   GOALS: Goals reviewed with patient? Yes  SHORT TERM GOALS: Target date: 07/13/2023   Patient will be independent with initial HEP. Baseline:  Goal status: MET  2.  Patient will demonstrate 5XSTS in < = 10 sec demonstrating improved functional strength Baseline: 15.13 sec Goal status: IN PROGRESS (see above)   LONG TERM GOALS: Target date: 09/07/2023  Patient will be independent with advanced/ongoing HEP to improve outcomes and carryover.  Baseline:  Goal status: IN PROGRESS  2.  Patient will be able to ambulate 600' with LRAD with good safety to access community.  Baseline: 334.63ft 08/25/23 Goal status: IN  PROGRESS (see above)  3.  Patient will be able to step up/down curb safely with LRAD for safety with community ambulation.  Baseline:  Goal status: INITIAL   4.  Patient will demonstrate improved TUG score to <= 9 sec indicating decreased risk of falls Baseline:  Goal status: IN PROGRESS (see above)  5.  Patient will demonstrate at least 19/24 on DGI to improve gait stability and reduce risk for falls. Baseline: 3/24 Goal status: IN PROGRESS  6.  Patient will report improved general mobility  by 50% or more to improve QOL. Baseline:  Goal status: INITIAL   PLAN:  PT FREQUENCY: 2x/week  PT DURATION: 12 weeks Patient will be on vacation until the week of 07/10/23  PLANNED INTERVENTIONS: Therapeutic exercises, Therapeutic activity, Neuromuscular re-education, Balance training, Gait training, Patient/Family education, Self Care, Joint mobilization, Stair training, Aquatic Therapy, Dry Needling, Electrical stimulation, Spinal mobilization, Cryotherapy, Moist heat, and Manual therapy.  PLAN FOR NEXT SESSION: Aquatic PT.  Land: balance; trunk flexibility, LE strengthening.   Claude Manges, PT 08/25/23 11:51 AM

## 2023-08-30 ENCOUNTER — Encounter (HOSPITAL_BASED_OUTPATIENT_CLINIC_OR_DEPARTMENT_OTHER): Payer: Self-pay | Admitting: Physical Therapy

## 2023-08-30 ENCOUNTER — Ambulatory Visit (HOSPITAL_BASED_OUTPATIENT_CLINIC_OR_DEPARTMENT_OTHER): Payer: Medicare Other | Admitting: Physical Therapy

## 2023-08-30 DIAGNOSIS — Z9181 History of falling: Secondary | ICD-10-CM | POA: Diagnosis not present

## 2023-08-30 DIAGNOSIS — R2689 Other abnormalities of gait and mobility: Secondary | ICD-10-CM | POA: Diagnosis not present

## 2023-08-30 DIAGNOSIS — M5459 Other low back pain: Secondary | ICD-10-CM | POA: Diagnosis not present

## 2023-08-30 DIAGNOSIS — M6281 Muscle weakness (generalized): Secondary | ICD-10-CM | POA: Diagnosis not present

## 2023-08-30 NOTE — Therapy (Signed)
OUTPATIENT PHYSICAL THERAPY TREATMENT NOTE   Patient Name: Terri Rodriguez MRN: [id number] DOB:1945-12-31, 77 y.o., female Today's Date: 08/30/2023  END OF SESSION:  PT End of Session - 08/30/23 1338     Visit Number 8    Date for PT Re-Evaluation 09/07/23    Authorization Type [employer] MCR    PT Start Time 1334    PT Stop Time 1415    PT Time Calculation (min) 41 min    Activity Tolerance Patient tolerated treatment well    Behavior During Therapy WFL for tasks assessed/performed               Past Medical History:  Diagnosis Date   ALLERGIC RHINITIS    Allergy    Anginal pain (HCC)    Blood transfusion without reported diagnosis    Cataract    small right   Cholelithiasis 06/09/2008   gallstones 2009 on ct scan   Complication of anesthesia 2012   went to ICU after bilateral knees , unstable vital signs oct 2012, has surgery since did ok   Coronary artery disease    30 % narrowing lad   Diverticulitis    EPICONDYLITIS, LATERAL    osteoarthritis, previous joint replacements   Fall 10/2018   GERD    Hepatic hemangioma 06/09/2008   Hiatal hernia 03/2000   Hx of adenomatous polyp of colon 06/05/2008   Hx of cardiac catheterization 2008   clean coronarys DrKelly    Hx of chest pain 2003   neg cath remot hx of narrowwing lad in 2003 nl 2008, none in many years   HYPERLIPIDEMIA    IBS (irritable bowel syndrome)    LEUKOPENIA, CHRONIC    Mononucleosis 1963   RAYNAUD'S SYNDROME, HX OF    improved after cardiac meds initiated   Rosacea    facial   Sleep apnea    SLEEP APNEA, OBSTRUCTIVE    cpap, settings "automatic" settings 11-12   Subacute thyroiditis    UNSPECIFIED ANEMIA    Past Surgical History:  Procedure Laterality Date   ABDOMINAL HYSTERECTOMY  1993   bso   ANTERIOR CERVICAL DECOMP/DISCECTOMY FUSION N/A 12/20/2022   Procedure: Cervical four-five Cervical five-six Cervical six-seven Anterior Cervical Decompression Fusion;  Surgeon: Barnett Abu, MD;   Location: [facility];  Service: Neurosurgery;  Laterality: N/A;   ANTERIOR FUSION CERVICAL SPINE N/A 12/20/2022   BACK SURGERY  2008   l 3 to l4 l4 to l5   BARTHOLIN GLAND CYST EXCISION Right 09/09/2019   Procedure: EXCISION OF VULVAR MASS TIMES TWO;  Surgeon: Jerene Bears, MD;  Location: [facility];  Service: Gynecology;  Laterality: Right;   CARDIAC CATHETERIZATION  04/07/2007   Noncritical coronary artery disease. Contiue medical therapy.   CARDIAC CATHETERIZATION  12/03/2007   Normal LV function. Mild angiographic mitral valve prolapse. Normal coronary arteries.   CARDIOVASCULAR STRESS TEST  11/09/2007   Moderate ischemia in Mid Anterior, Mid Anteroseptal, Apical Anterior, and Apical Septal regions. EKG negative for ischemia.   CATARACT EXTRACTION Right 04/25/2023   CESAREAN SECTION  1985, 1989   COLON RESECTION Left 01/03/2022   Procedure: COLON RESECTION;  Surgeon: Quentin Ore, MD;  Location: [facility];  Service: General;  Laterality: Left;   COLOSTOMY  01/03/2022   Procedure: COLOSTOMY;  Surgeon: Quentin Ore, MD;  Location: [facility];  Service: General;;   dental implants  11/2002,11/2011,11/2012   dental implants     x 6   DENTAL SURGERY  DILATION AND CURETTAGE OF UTERUS     d and e 1988 and 1990   JOINT REPLACEMENT  09/21/2011   bilateral knee   KNEE ARTHROSCOPY     bilateral   LAPAROTOMY N/A 01/03/2022   Procedure: EXPLORATORY LAPAROTOMY;  Surgeon: Quentin Ore, MD;  Location: [facility];  Service: General;  Laterality: N/A;   lumbar surgery fixation with disc replacement  09/2007   Left   NASAL SEPTOPLASTY W/ TURBINOPLASTY  05/2000   NOCTURNAL POLYSOMNOGRAM  12/31/2006   Severe obstructive sleep apnea. AHI-87/hr   REPLACEMENT TOTAL KNEE  2012   bilateral   TONSILLECTOMY  1952   adenoids too   TOTAL HIP ARTHROPLASTY Left 10/21/2013   Procedure: LEFT TOTAL HIP ARTHROPLASTY ANTERIOR APPROACH;  Surgeon: Loanne Drilling, MD;  Location: [facility];  Service: Orthopedics;  Laterality: Left;   TOTAL HIP ARTHROPLASTY Right 07/16/2014   Procedure: RIGHT TOTAL HIP ARTHROPLASTY ANTERIOR APPROACH;  Surgeon: Loanne Drilling, MD;  Location: [facility];  Service: Orthopedics;  Laterality: Right;   TRANSTHORACIC ECHOCARDIOGRAM  11/09/2007   EF 58%, LV systolic function normal. Mild aortic root dilation.   WRIST SURGERY Left 04/03/2014   ORIF "distal radial head fracture"   XI ROBOTIC ASSISTED COLOSTOMY TAKEDOWN N/A 09/26/2022   Procedure: ROBOTIC OSTOMY REVERSAL;  Surgeon: Quentin Ore, MD;  Location: [facility];  Service: General;  Laterality: N/A;   Patient Active Problem List   Diagnosis Date Noted   Cervical myelopathy (HCC) 12/20/2022   History of creation of ostomy (HCC) 09/26/2022   Colostomy in place (HCC) 01/09/2022   IBS (irritable bowel syndrome) 01/05/2022   Physical deconditioning 01/05/2022   Perforation of colon s/p Hartmann colectomy/colostomy 01/03/2022 01/02/2022   Lumbar post-laminectomy syndrome 10/07/2021   Cervical spondylosis without myelopathy 10/07/2021   Primary osteoarthritis of both feet 06/18/2017   Status post bilateral total hip replacement 06/18/2017   Status post total knee replacement, bilateral 06/18/2017   Degenerative disc disease, lumbar status post fusion 06/18/2017   Degenerative disc disease, cervical 06/18/2017   History of cholelithiasis 06/18/2017   Other chronic pain 02/01/2017   Visit for preventive health examination 01/21/2015   Medicare annual wellness visit, subsequent 01/21/2015   BMI 40.0-44.9, adult (HCC) 01/21/2015   Hyperlipidemia LDL goal <70 01/12/2015   Morbid obesity (HCC) 01/12/2015   Hyperglycemia 01/10/2014   Mild CAD 01/06/2014   Medication monitoring encounter 09/22/2012   Hx of cardiac catheterization    Adenomatous polyp of colon 06/14/2012   ROSACEA 04/26/2010   Obstructive sleep apnea 09/09/2009   Normocytic anemia 12/15/2008   LEUKOPENIA, CHRONIC 01/08/2008    Elevated lipids 08/17/2007   ALLERGIC RHINITIS 08/17/2007   GERD 08/17/2007   Primary osteoarthritis of both hands 08/17/2007    PCP: Madelin Headings, MD   REFERRING PROVIDER: Madelin Headings, MD   REFERRING DIAG: M54.10 (ICD-10-CM) - Back pain with left-sided radiculopathy  G89.29 (ICD-10-CM) - Other chronic pain M15.9 (ICD-10-CM) - Osteoarthritis of multiple joints, unspecified osteoarthritis type   Rationale for Evaluation and Treatment: Rehabilitation  THERAPY DIAG:  Muscle weakness (generalized)  History of falling  Other abnormalities of gait and mobility  Other low back pain  ONSET DATE: 01/03/23  SUBJECTIVE:  SUBJECTIVE STATEMENT: "I was about a 8-9/10 before coming here doing what I needed to do to get ready but now low".  PERTINENT HISTORY:  ACDF 12/20/22, Long history of medical and surgical diagnoses. Memory issues, LBP since surgery 2008, fibromyalgia, B hip and knee replacements, R achilles tear 2022  PAIN:  Are you having pain? Yes: NPRS scale: 2/10 Pain location: LBP some sciatica B Pain description: ache Aggravating factors: sitting, bending Relieving factors: flexion  PRECAUTIONS: Other: ACDF 12/20/22  RED FLAGS: None   WEIGHT BEARING RESTRICTIONS: No  FALLS:  Has patient fallen in last 6 months? Yes. Number of falls fell in Dr.'s office  3 months ago  LIVING ENVIRONMENT: Lives with: lives with their spouse Lives in: House/apartment Stairs: Yes: Internal: 16 steps; can reach both and External: 3-9 steps; can reach both Has following equipment at home: Single point cane, Walker - 2 wheeled, and Environmental consultant - 4 wheeled  OCCUPATION: retired physician  PLOF: Independent with household mobility with device  PATIENT GOALS: Would like to move around without  grunting  NEXT MD VISIT: 6-12 months  OBJECTIVE:   DIAGNOSTIC FINDINGS:  N/A   COGNITION: Overall cognitive status:  reports memory issues      SENSATION: WFL  MUSCLE LENGTH:   POSTURE: rounded shoulders, forward head, and flexed trunk   PALPATION: TBD  LUMBAR ROM: *pain  AROM eval  Flexion To knees *  Extension Neutral *  Right lateral flexion 25%  Left lateral flexion 25%  Right rotation 75%  Left rotation 75%   (Blank rows = not tested)  LOWER EXTREMITY ROM:   ER limited B - unable to place foot on opp knee  Active  Right eval Left eval  Hip flexion    Hip extension    Hip abduction    Hip adduction    Hip internal rotation    Hip external rotation    Knee flexion    Knee extension    Ankle dorsiflexion    Ankle plantarflexion    Ankle inversion    Ankle eversion     (Blank rows = not tested)  LOWER EXTREMITY MMT:    MMT Right eval Left eval  Hip flexion 5 4+  Hip extension    Hip abduction    Hip adduction    Hip internal rotation    Hip external rotation    Knee flexion 5 5  Knee extension 5 4+  Ankle dorsiflexion  5 in available range  Ankle plantarflexion    Ankle inversion    Ankle eversion     (Blank rows = not tested)  UPPER EXTREMITY MMT: Shoulder flex and ABD 4+/5, IR/ER 5/5   FUNCTIONAL TESTS:  Eval: 5 times sit to stand: 31 sec Timed up and go (TUG): 15.13 SEC 3 minute walk test: 242 ft - reports increased pain in LB, R hip and L calf after Dynamic Gait Index: TBD  07/28/2023: 5 times sit to stand:  16.39 sec Timed up and go (TUG):  13.47 sec without assistive device 3 minute walk test:  3 minute ambulation: 259 ft with increased back and hip pain as a result (HR of 92 bpm and 97% O2 sats following)                       08/25/23 4 MWT: 334.29ft ; used single point cane for the last 3 minutes GAIT: Distance walked: 242 ft Assistive device utilized: Single point cane Level of assistance:  Modified  independence Comments: short step length B, heel strike decreases B as gait progresses  TODAY'S TREATMENT:                                                                                                                              08/23/23 Pt seen for aquatic therapy today.  Treatment took place in water 3.5-4.75 ft in depth at the [facility] pool. Temp of water was 91.  Pt entered/exited the pool via stairs using step to pattern with hand rail.  *walking ue support barbell forward and backward x 4 widths; side ways x 2 *decompression on yellow noodle:cycling; UE support wall hip add/abd; skiing (difficult) *Standing 4.0  ft ue support on noodle: high knee marching; relaxed squat; df; pf; x 10-12; hip abd 2 x 5; high knee marching x 10; hamstring curls x 10 *STS from bench onto water step 2 x 5 ue support barbell.   *Seated on 3rd step: hip abd/add 2 sets 10 slow then 10 fast; flutter kicking 2 sets 10 slow then 10 fast *L stretch x 3  Pt requires the buoyancy and hydrostatic pressure of water for support, and to offload joints by unweighting joint load by at least 50 % in navel deep water and by at least 75-80% in chest to neck deep water.  Viscosity of the water is needed for resistance of strengthening. Water current perturbations provides challenge to standing balance requiring increased core activation.  08/25/23 Nustep level 3 x7 min with PT present to discuss status Seated Marching 2x10 Seated hip adduction with ball squeeze 2x10 Seated hip abduction with red tband 2x10 4 MWT 334.34ft ; used single point cane for the last 3 minutes Sit to Stand no UE 2x10  Step Taps 6 inch step x20 Standing L at [facility] Seated lateral stretch with peanut stability ball     DATE:  08/11/2023 Nustep level 3 x6 min with PT present to discuss status Dynamic Gait Index 3/24 Seated:  heel/toe raises, marching, LAQ.  2x10 each bilat Seated hip adduction with ball squeeze 2x10 Seated hip  abduction with red tband 2x10 Seated hamstring curl with red tband 2x10 bilat Sit to/from stand 3x5 with UE Standing at barre:  marching, hip abduction, and hip extension.  X10 each bilat     PATIENT EDUCATION:  Education details: PT eval findings, anticipated POC, need for further assessment of LE flexibility, strength and balance, and initial HEP  Person educated: Patient Education method: Explanation, Demonstration, and Handouts Education comprehension: verbalized understanding and returned demonstration  HOME EXERCISE PROGRAM: Access Code: [facility] URL: [email or web address] Date: 08/25/2023 Prepared by: Claude Manges  Exercises - Sit to Stand with Arms Crossed  - 3 x daily - 3-4 x weekly - 1 sets - 10 reps - Heel Raises with Counter Support  - 1 x daily - 3-4 x weekly - 1-3 sets - 10 reps - Heel Toe Raises with Counter Support  - 1  x daily - 3-4 x weekly - 1-3 sets - 10 reps - Standing March with Counter Support  - 1 x daily - 3-4 x weekly - 1-3 sets - 10 reps - Standing Hip Abduction with Counter Support  - 1 x daily - 3-4 x weekly - 1-3 sets - 10 reps - Standing Hip Extension with Counter Support  - 1 x daily - 3-4 x weekly - 1-3 sets - 10 reps - Seated March  - 1 x daily - 7 x weekly - 2 sets - 10 reps - Seated Long Arc Quad  - 1 x daily - 7 x weekly - 2 sets - 10 reps - Seated Hip Adduction Isometrics with Ball  - 1 x daily - 7 x weekly - 2 sets - 10 reps - Seated Hip Abduction with Resistance  - 1 x daily - 7 x weekly - 2 sets - 10 reps - Standing 'L' Stretch at Counter  - 1 x daily - 7 x weekly - 2 sets - 10 reps  ASSESSMENT:  CLINICAL IMPRESSION: Progressed  balance challenges decreasing ue support with exercises which proved to be a good challenge. She tends to lose balance backward but is able to regain positioning indep the water being safe environment for practice. Continues to complain of LBP which is relieved when submerged but returns once land  based. Pt end of cert is 16/1. No further aquatic session scheduled.  She has progressed well in setting assisting pt to reach land based goals.       OBJECTIVE IMPAIRMENTS: Abnormal gait, decreased activity tolerance, decreased balance, decreased ROM, decreased strength, increased muscle spasms, impaired flexibility, postural dysfunction, and pain.   ACTIVITY LIMITATIONS: bending, standing, stairs, and locomotion level  PARTICIPATION LIMITATIONS: driving, shopping, and community activity  PERSONAL FACTORS: Age, Behavior pattern, Time since onset of injury/illness/exacerbation, and 3+ comorbidities: ACDF 12/20/22, Long history of medical and surgical diagnoses. Memory issues, LBP since surgery 2008, fibromyalgia, B hip and knee replacements, R achilles tear 2022  are also affecting patient's functional outcome.   REHAB POTENTIAL: Good  CLINICAL DECISION MAKING: Evolving/moderate complexity  EVALUATION COMPLEXITY: Moderate   GOALS: Goals reviewed with patient? Yes  SHORT TERM GOALS: Target date: 07/13/2023   Patient will be independent with initial HEP. Baseline:  Goal status: MET  2.  Patient will demonstrate 5XSTS in < = 10 sec demonstrating improved functional strength Baseline: 15.13 sec Goal status: IN PROGRESS (see above)   LONG TERM GOALS: Target date: 09/07/2023  Patient will be independent with advanced/ongoing HEP to improve outcomes and carryover.  Baseline:  Goal status: IN PROGRESS  2.  Patient will be able to ambulate 600' with LRAD with good safety to access community.  Baseline: 334.63ft 08/25/23 Goal status: IN PROGRESS (see above)  3.  Patient will be able to step up/down curb safely with LRAD for safety with community ambulation.  Baseline:  Goal status: INITIAL   4.  Patient will demonstrate improved TUG score to <= 9 sec indicating decreased risk of falls Baseline:  Goal status: IN PROGRESS (see above)  5.  Patient will demonstrate at least 19/24 on  DGI to improve gait stability and reduce risk for falls. Baseline: 3/24 Goal status: IN PROGRESS  6.  Patient will report improved general mobility by 50% or more to improve QOL. Baseline:  Goal status: INITIAL   PLAN:  PT FREQUENCY: 2x/week  PT DURATION: 12 weeks Patient will be on vacation until the week of 07/10/23  PLANNED INTERVENTIONS: Therapeutic exercises, Therapeutic activity, Neuromuscular re-education, Balance training, Gait training, Patient/Family education, Self Care, Joint mobilization, Stair training, Aquatic Therapy, Dry Needling, Electrical stimulation, Spinal mobilization, Cryotherapy, Moist heat, and Manual therapy.  PLAN FOR NEXT SESSION: Aquatic PT.  Land: balance; trunk flexibility, LE strengthening.   Terri Rodriguez MPT 08/30/23 1:40 PM

## 2023-08-31 ENCOUNTER — Ambulatory Visit: Payer: Medicare Other | Admitting: Physical Therapy

## 2023-08-31 ENCOUNTER — Encounter: Payer: Self-pay | Admitting: Physical Therapy

## 2023-08-31 DIAGNOSIS — M6281 Muscle weakness (generalized): Secondary | ICD-10-CM | POA: Diagnosis not present

## 2023-08-31 DIAGNOSIS — R2689 Other abnormalities of gait and mobility: Secondary | ICD-10-CM | POA: Diagnosis not present

## 2023-08-31 DIAGNOSIS — M545 Low back pain, unspecified: Secondary | ICD-10-CM | POA: Diagnosis not present

## 2023-08-31 DIAGNOSIS — Z9181 History of falling: Secondary | ICD-10-CM

## 2023-08-31 DIAGNOSIS — R262 Difficulty in walking, not elsewhere classified: Secondary | ICD-10-CM | POA: Diagnosis not present

## 2023-08-31 DIAGNOSIS — M961 Postlaminectomy syndrome, not elsewhere classified: Secondary | ICD-10-CM | POA: Diagnosis not present

## 2023-08-31 DIAGNOSIS — M5459 Other low back pain: Secondary | ICD-10-CM | POA: Diagnosis not present

## 2023-08-31 NOTE — Therapy (Addendum)
OUTPATIENT PHYSICAL THERAPY TREATMENT NOTE / Re-Evalutation / LATE ENTRY DISCHARGE NOTE   Progress Note Reporting Period 06/15/2023 to 08/31/2023  See note below for Objective Data and Assessment of Progress/Goals.     Patient Name: Terri Rodriguez MRN: [id number] DOB:05-Mar-1946, 77 y.o., female Today's Date: 08/31/2023  END OF SESSION:  PT End of Session - 08/31/23 1200     Visit Number 9    Date for PT Re-Evaluation 10/26/23    Authorization Type [employer] MCR    Progress Note Due on Visit 19    PT Start Time 1100    PT Stop Time 1143    PT Time Calculation (min) 43 min    Activity Tolerance Patient tolerated treatment well    Behavior During Therapy WFL for tasks assessed/performed                Past Medical History:  Diagnosis Date   ALLERGIC RHINITIS    Allergy    Anginal pain (HCC)    Blood transfusion without reported diagnosis    Cataract    small right   Cholelithiasis 06/09/2008   gallstones 2009 on ct scan   Complication of anesthesia 2012   went to ICU after bilateral knees , unstable vital signs oct 2012, has surgery since did ok   Coronary artery disease    30 % narrowing lad   Diverticulitis    EPICONDYLITIS, LATERAL    osteoarthritis, previous joint replacements   Fall 10/2018   GERD    Hepatic hemangioma 06/09/2008   Hiatal hernia 03/2000   Hx of adenomatous polyp of colon 06/05/2008   Hx of cardiac catheterization 2008   clean coronarys DrKelly    Hx of chest pain 2003   neg cath remot hx of narrowwing lad in 2003 nl 2008, none in many years   HYPERLIPIDEMIA    IBS (irritable bowel syndrome)    LEUKOPENIA, CHRONIC    Mononucleosis 1963   RAYNAUD'S SYNDROME, HX OF    improved after cardiac meds initiated   Rosacea    facial   Sleep apnea    SLEEP APNEA, OBSTRUCTIVE    cpap, settings "automatic" settings 11-12   Subacute thyroiditis    UNSPECIFIED ANEMIA    Past Surgical History:  Procedure Laterality Date   ABDOMINAL HYSTERECTOMY   1993   bso   ANTERIOR CERVICAL DECOMP/DISCECTOMY FUSION N/A 12/20/2022   Procedure: Cervical four-five Cervical five-six Cervical six-seven Anterior Cervical Decompression Fusion;  Surgeon: Barnett Abu, MD;  Location: [facility];  Service: Neurosurgery;  Laterality: N/A;   ANTERIOR FUSION CERVICAL SPINE N/A 12/20/2022   BACK SURGERY  2008   l 3 to l4 l4 to l5   BARTHOLIN GLAND CYST EXCISION Right 09/09/2019   Procedure: EXCISION OF VULVAR MASS TIMES TWO;  Surgeon: Jerene Bears, MD;  Location: [facility];  Service: Gynecology;  Laterality: Right;   CARDIAC CATHETERIZATION  04/07/2007   Noncritical coronary artery disease. Contiue medical therapy.   CARDIAC CATHETERIZATION  12/03/2007   Normal LV function. Mild angiographic mitral valve prolapse. Normal coronary arteries.   CARDIOVASCULAR STRESS TEST  11/09/2007   Moderate ischemia in Mid Anterior, Mid Anteroseptal, Apical Anterior, and Apical Septal regions. EKG negative for ischemia.   CATARACT EXTRACTION Right 04/25/2023   CESAREAN SECTION  1985, 1989   COLON RESECTION Left 01/03/2022   Procedure: COLON RESECTION;  Surgeon: Quentin Ore, MD;  Location: [facility];  Service: General;  Laterality: Left;   COLOSTOMY  01/03/2022   Procedure: COLOSTOMY;  Surgeon: Quentin Ore, MD;  Location: [facility];  Service: General;;   dental implants  11/2002,11/2011,11/2012   dental implants     x 6   DENTAL SURGERY     DILATION AND CURETTAGE OF UTERUS     d and e 1988 and 1990   JOINT REPLACEMENT  09/21/2011   bilateral knee   KNEE ARTHROSCOPY     bilateral   LAPAROTOMY N/A 01/03/2022   Procedure: EXPLORATORY LAPAROTOMY;  Surgeon: Quentin Ore, MD;  Location: [facility];  Service: General;  Laterality: N/A;   lumbar surgery fixation with disc replacement  09/2007   Left   NASAL SEPTOPLASTY W/ TURBINOPLASTY  05/2000   NOCTURNAL POLYSOMNOGRAM  12/31/2006   Severe obstructive sleep apnea. AHI-87/hr   REPLACEMENT  TOTAL KNEE  2012   bilateral   TONSILLECTOMY  1952   adenoids too   TOTAL HIP ARTHROPLASTY Left 10/21/2013   Procedure: LEFT TOTAL HIP ARTHROPLASTY ANTERIOR APPROACH;  Surgeon: Loanne Drilling, MD;  Location: [facility];  Service: Orthopedics;  Laterality: Left;   TOTAL HIP ARTHROPLASTY Right 07/16/2014   Procedure: RIGHT TOTAL HIP ARTHROPLASTY ANTERIOR APPROACH;  Surgeon: Loanne Drilling, MD;  Location: [facility];  Service: Orthopedics;  Laterality: Right;   TRANSTHORACIC ECHOCARDIOGRAM  11/09/2007   EF 58%, LV systolic function normal. Mild aortic root dilation.   WRIST SURGERY Left 04/03/2014   ORIF "distal radial head fracture"   XI ROBOTIC ASSISTED COLOSTOMY TAKEDOWN N/A 09/26/2022   Procedure: ROBOTIC OSTOMY REVERSAL;  Surgeon: Quentin Ore, MD;  Location: [facility];  Service: General;  Laterality: N/A;   Patient Active Problem List   Diagnosis Date Noted   Cervical myelopathy (HCC) 12/20/2022   History of creation of ostomy (HCC) 09/26/2022   Colostomy in place [facility]) 01/09/2022   IBS (irritable bowel syndrome) 01/05/2022   Physical deconditioning 01/05/2022   Perforation of colon s/p Hartmann colectomy/colostomy 01/03/2022 01/02/2022   Lumbar post-laminectomy syndrome 10/07/2021   Cervical spondylosis without myelopathy 10/07/2021   Primary osteoarthritis of both feet 06/18/2017   Status post bilateral total hip replacement 06/18/2017   Status post total knee replacement, bilateral 06/18/2017   Degenerative disc disease, lumbar status post fusion 06/18/2017   Degenerative disc disease, cervical 06/18/2017   History of cholelithiasis 06/18/2017   Other chronic pain 02/01/2017   Visit for preventive health examination 01/21/2015   Medicare annual wellness visit, subsequent 01/21/2015   BMI 40.0-44.9, adult (HCC) 01/21/2015   Hyperlipidemia LDL goal <70 01/12/2015   Morbid obesity (HCC) 01/12/2015   Hyperglycemia 01/10/2014   Mild CAD 01/06/2014   Medication monitoring  encounter 09/22/2012   Hx of cardiac catheterization    Adenomatous polyp of colon 06/14/2012   ROSACEA 04/26/2010   Obstructive sleep apnea 09/09/2009   Normocytic anemia 12/15/2008   LEUKOPENIA, CHRONIC 01/08/2008   Elevated lipids 08/17/2007   ALLERGIC RHINITIS 08/17/2007   GERD 08/17/2007   Primary osteoarthritis of both hands 08/17/2007    PCP: Madelin Headings, MD   REFERRING PROVIDER: Madelin Headings, MD   REFERRING DIAG: M54.10 (ICD-10-CM) - Back pain with left-sided radiculopathy  G89.29 (ICD-10-CM) - Other chronic pain M15.9 (ICD-10-CM) - Osteoarthritis of multiple joints, unspecified osteoarthritis type   Rationale for Evaluation and Treatment: Rehabilitation  THERAPY DIAG:  Muscle weakness (generalized) - Plan: PT plan of care cert/re-cert  Pain in left lumbar region of back - Plan: PT plan of care cert/re-cert  History of falling -  Plan: PT plan of care cert/re-cert  Lumbar post-laminectomy syndrome - Plan: PT plan of care cert/re-cert  Other abnormalities of gait and mobility - Plan: PT plan of care cert/re-cert  Other low back pain - Plan: PT plan of care cert/re-cert  Difficulty in walking, not elsewhere classified - Plan: PT plan of care cert/re-cert  ONSET DATE: 01/03/23  SUBJECTIVE:                                                                                                                                                                                           SUBJECTIVE STATEMENT: I am doing okay today. I had aquatic therapy yesterday and that went good.   PERTINENT HISTORY:  ACDF 12/20/22, Long history of medical and surgical diagnoses. Memory issues, LBP since surgery 2008, fibromyalgia, B hip and knee replacements, R achilles tear 2022  PAIN: 08/31/2023 Are you having pain? Yes: NPRS scale: 1/10 Pain location: LBP some sciatica B Pain description: ache Aggravating factors: sitting, bending Relieving factors: flexion  PRECAUTIONS: Other:  ACDF 12/20/22  RED FLAGS: None   WEIGHT BEARING RESTRICTIONS: No  FALLS:  Has patient fallen in last 6 months? Yes. Number of falls fell in Dr.'s office  3 months ago  LIVING ENVIRONMENT: Lives with: lives with their spouse Lives in: House/apartment Stairs: Yes: Internal: 16 steps; can reach both and External: 3-9 steps; can reach both Has following equipment at home: Single point cane, Walker - 2 wheeled, and Environmental consultant - 4 wheeled  OCCUPATION: retired physician  PLOF: Independent with household mobility with device  PATIENT GOALS: Would like to move around without grunting  NEXT MD VISIT: 6-12 months  OBJECTIVE:   DIAGNOSTIC FINDINGS:  N/A   COGNITION: Overall cognitive status:  reports memory issues      SENSATION: WFL  MUSCLE LENGTH:   POSTURE: rounded shoulders, forward head, and flexed trunk   PALPATION: TBD  LUMBAR ROM: *pain  AROM eval  Flexion To knees *  Extension Neutral *  Right lateral flexion 25%  Left lateral flexion 25%  Right rotation 75%  Left rotation 75%   (Blank rows = not tested)  LOWER EXTREMITY ROM:   ER limited B - unable to place foot on opp knee  Active  Right eval Left eval  Hip flexion    Hip extension    Hip abduction    Hip adduction    Hip internal rotation    Hip external rotation    Knee flexion    Knee extension    Ankle dorsiflexion    Ankle plantarflexion    Ankle inversion    Ankle eversion     (  Blank rows = not tested)  LOWER EXTREMITY MMT:    MMT Right eval Left eval  Hip flexion 5 4+  Hip extension    Hip abduction    Hip adduction    Hip internal rotation    Hip external rotation    Knee flexion 5 5  Knee extension 5 4+  Ankle dorsiflexion  5 in available range  Ankle plantarflexion    Ankle inversion    Ankle eversion     (Blank rows = not tested)  UPPER EXTREMITY MMT: Shoulder flex and ABD 4+/5, IR/ER 5/5   FUNCTIONAL TESTS:  Eval: 5 times sit to stand: 31 sec Timed up and go  (TUG): 15.13 SEC 3 minute walk test: 242 ft - reports increased pain in LB, R hip and L calf after Dynamic Gait Index: TBD  07/28/2023: 5 times sit to stand:  16.39 sec Timed up and go (TUG):  13.47 sec without assistive device 3 minute walk test:  3 minute ambulation: 259 ft with increased back and hip pain as a result (HR of 92 bpm and 97% O2 sats following)                       08/25/23 4 MWT: 334.59ft ; used single point cane for the last 3 minutes  08/31/2023 TUG 13.24 5STS: 16.52 no use of UE GAIT: Distance walked: 242 ft Assistive device utilized: Single point cane Level of assistance: Modified independence Comments: short step length B, heel strike decreases B as gait progresses  TODAY'S TREATMENT:                                                                                                                              08/31/23 Nustep level 3 x7 min with PT present to discuss status Seated lateral stretch with peanut stability ball 3 x 15sec each side Reassessment : TUG, 5STS, discussed POC and progress Standing hip series at barre: hip flexion, abduction, extension 2x10 Airex step ups and side steps x10 each way Mod UE support on barre Cone Weaving (In and Out) x4 Min- Mod UE support on barre Standing Lt stretch at barre    08/23/23 Pt seen for aquatic therapy today.  Treatment took place in water 3.5-4.75 ft in depth at the [facility] pool. Temp of water was 91.  Pt entered/exited the pool via stairs using step to pattern with hand rail.  *walking ue support barbell forward and backward x 4 widths; side ways x 2 *decompression on yellow noodle:cycling; UE support wall hip add/abd; skiing (difficult) *Standing 4.0  ft ue support on noodle: high knee marching; relaxed squat; df; pf; x 10-12; hip abd 2 x 5; high knee marching x 10; hamstring curls x 10 *STS from bench onto water step 2 x 5 ue support barbell.   *Seated on 3rd step: hip abd/add 2 sets 10 slow then  10 fast; flutter kicking 2 sets  10 slow then 10 fast *L stretch x 3  Pt requires the buoyancy and hydrostatic pressure of water for support, and to offload joints by unweighting joint load by at least 50 % in navel deep water and by at least 75-80% in chest to neck deep water.  Viscosity of the water is needed for resistance of strengthening. Water current perturbations provides challenge to standing balance requiring increased core activation.   08/25/23 Nustep level 3 x7 min with PT present to discuss status Seated Marching 2x10 Seated hip adduction with ball squeeze 2x10 Seated hip abduction with red tband 2x10 4 MWT 334.26ft ; used single point cane for the last 3 minutes Sit to Stand no UE 2x10  Step Taps 6 inch step x20 Standing L at [employer] Seated lateral stretch with peanut stability ball    PATIENT EDUCATION:  Education details: PT eval findings, anticipated POC, need for further assessment of LE flexibility, strength and balance, and initial HEP  Person educated: Patient Education method: Explanation, Demonstration, and Handouts Education comprehension: verbalized understanding and returned demonstration  HOME EXERCISE PROGRAM: Access Code: [facility] URL: [email or web address] Date: 08/25/2023 Prepared by: Claude Manges  Exercises - Sit to Stand with Arms Crossed  - 3 x daily - 3-4 x weekly - 1 sets - 10 reps - Heel Raises with Counter Support  - 1 x daily - 3-4 x weekly - 1-3 sets - 10 reps - Heel Toe Raises with Counter Support  - 1 x daily - 3-4 x weekly - 1-3 sets - 10 reps - Standing March with Counter Support  - 1 x daily - 3-4 x weekly - 1-3 sets - 10 reps - Standing Hip Abduction with Counter Support  - 1 x daily - 3-4 x weekly - 1-3 sets - 10 reps - Standing Hip Extension with Counter Support  - 1 x daily - 3-4 x weekly - 1-3 sets - 10 reps - Seated March  - 1 x daily - 7 x weekly - 2 sets - 10 reps - Seated Long Arc Quad  - 1 x daily - 7 x weekly - 2  sets - 10 reps - Seated Hip Adduction Isometrics with Ball  - 1 x daily - 7 x weekly - 2 sets - 10 reps - Seated Hip Abduction with Resistance  - 1 x daily - 7 x weekly - 2 sets - 10 reps - Standing 'L' Stretch at Counter  - 1 x daily - 7 x weekly - 2 sets - 10 reps  ASSESSMENT:  CLINICAL IMPRESSION: Re-assessed Terri Rodriguez progress today. She was able to complete 5 sit to stand without using UE support and she shaved a few microseconds off her TUG time without using her straight cane. While performing TUG her momentum slowed down while turning around the cone. Functionally, her shower tolerance has improved since getting a shower chair and grab bars; she is able to negotiate stairs easier, and she can load and empty the dishwasher with no problem. She still has trouble with walking around the kitchen to cook and clean, completing car transfers, and sit to stand transfer after sitting for longer than 30 minutes. Terri Rodriguez is still a fall risk based on her TUG time and occasionally looses her balance while doing single leg activities. Patient will benefit from continuing aquatic and land therapy. Patient will benefit from skilled PT to address the below impairments and improve overall function.     OBJECTIVE IMPAIRMENTS: Abnormal gait, decreased activity tolerance,  decreased balance, decreased ROM, decreased strength, increased muscle spasms, impaired flexibility, postural dysfunction, and pain.   ACTIVITY LIMITATIONS: bending, standing, stairs, and locomotion level  PARTICIPATION LIMITATIONS: driving, shopping, and community activity  PERSONAL FACTORS: Age, Behavior pattern, Time since onset of injury/illness/exacerbation, and 3+ comorbidities: ACDF 12/20/22, Long history of medical and surgical diagnoses. Memory issues, LBP since surgery 2008, fibromyalgia, B hip and knee replacements, R achilles tear 2022  are also affecting patient's functional outcome.   REHAB POTENTIAL: Good  CLINICAL  DECISION MAKING: Evolving/moderate complexity  EVALUATION COMPLEXITY: Moderate   GOALS: Goals reviewed with patient? Yes  SHORT TERM GOALS: Target date: 09/28/2023   Patient will be independent with initial HEP. Baseline:  Goal status: MET  2.  Patient will demonstrate 5XSTS in < = 10 sec demonstrating improved functional strength Baseline: 15.13 sec   08/31/2019 : 16.52 no use of UE Goal status: IN PROGRESS (see above)   LONG TERM GOALS: Target date: 10/26/2023   Patient will be independent with advanced/ongoing HEP to improve outcomes and carryover.  Baseline:  Goal status: IN PROGRESS  2.  Patient will be able to ambulate 600' with LRAD with good safety to access community.  Baseline: 334.38ft 08/25/23 Goal status: IN PROGRESS (see above)  3.  Patient will be able to step up/down curb safely with LRAD for safety with community ambulation.  Baseline:  Goal status: INITIAL   4.  Patient will demonstrate improved TUG score to <= 9 sec indicating decreased risk of falls Baseline: 13.25 sec 08/31/2023 Goal status: IN PROGRESS (see above)  5.  Patient will demonstrate at least 19/24 on DGI to improve gait stability and reduce risk for falls. Baseline: 3/24 Goal status: IN PROGRESS  6.  Patient will report improved general mobility by 50% or more to improve QOL. Baseline:  Goal status: INITIAL   PLAN:  PT FREQUENCY: 2x/week  PT DURATION: 12 weeks Patient will be on vacation until the week of 07/10/23  PLANNED INTERVENTIONS: Therapeutic exercises, Therapeutic activity, Neuromuscular re-education, Balance training, Gait training, Patient/Family education, Self Care, Joint mobilization, Stair training, Aquatic Therapy, Dry Needling, Electrical stimulation, Spinal mobilization, Cryotherapy, Moist heat, and Manual therapy.  PLAN FOR NEXT SESSION:  continue aquatic and land therapy.   Claude Manges, PT 08/31/23 5:11 PM  [facility] [address], [address] [place], [place] [address] Phone # [phone] Fax [phone]   PHYSICAL THERAPY DISCHARGE SUMMARY  Visits from Start of Care: 9  As of 01/04/24 Patient has not return to therapy. Patient will be d/c at this time.  Patient agrees to discharge. Patient goals were partially met. Patient is being discharged due to not returning since the last visit.  Claude Manges, PT 01/04/24 8:17 AM

## 2023-09-01 ENCOUNTER — Encounter: Payer: Medicare Other | Admitting: Rehabilitative and Restorative Service Providers"

## 2023-09-05 ENCOUNTER — Other Ambulatory Visit: Payer: Self-pay | Admitting: Cardiovascular Disease

## 2023-09-07 DIAGNOSIS — D485 Neoplasm of uncertain behavior of skin: Secondary | ICD-10-CM | POA: Diagnosis not present

## 2023-09-07 DIAGNOSIS — L309 Dermatitis, unspecified: Secondary | ICD-10-CM | POA: Diagnosis not present

## 2023-09-07 DIAGNOSIS — D0471 Carcinoma in situ of skin of right lower limb, including hip: Secondary | ICD-10-CM | POA: Diagnosis not present

## 2023-09-11 ENCOUNTER — Encounter: Payer: Self-pay | Admitting: Internal Medicine

## 2023-09-22 DIAGNOSIS — G4733 Obstructive sleep apnea (adult) (pediatric): Secondary | ICD-10-CM | POA: Diagnosis not present

## 2023-09-25 ENCOUNTER — Ambulatory Visit: Payer: Medicare Other | Admitting: Internal Medicine

## 2023-09-27 DIAGNOSIS — G4733 Obstructive sleep apnea (adult) (pediatric): Secondary | ICD-10-CM | POA: Diagnosis not present

## 2023-09-28 ENCOUNTER — Ambulatory Visit: Payer: Medicare Other | Admitting: Psychology

## 2023-09-28 ENCOUNTER — Other Ambulatory Visit: Payer: Self-pay

## 2023-09-28 DIAGNOSIS — F331 Major depressive disorder, recurrent, moderate: Secondary | ICD-10-CM | POA: Diagnosis not present

## 2023-09-28 DIAGNOSIS — F068 Other specified mental disorders due to known physiological condition: Secondary | ICD-10-CM

## 2023-09-28 DIAGNOSIS — F419 Anxiety disorder, unspecified: Secondary | ICD-10-CM | POA: Diagnosis not present

## 2023-09-28 MED ORDER — DULOXETINE HCL 60 MG PO CPEP: 60.0000 mg | ORAL_CAPSULE | Freq: Every day | ORAL | 0 refills | Status: DC

## 2023-09-28 NOTE — Progress Notes (Signed)
[facility] Initial Adult Intake  Name: Terri Rodriguez Date: 09/28/2023 MRN: [id number] DOB: 12-Jul-1946 PCP: Madelin Headings, MD  Time spent: 12:00 PM - 12:59 PM  Guardian/Payee:  Self    Paperwork requested: Yes   Today I met with [name] for in office face-to-face individual psychotherapy.  Reason for Visit /Presenting Problem:  Terri Rodriguez is an 77 y.o. MWW who had a medical crisis and "hasn't been the same since."  In 2023, she went to see a G/I, was misdiagnosed, her diverticuli ruptured and she became septic.  The most distressing sequelae has been the changes to her cognitive health.  In particular, it seems she is having difficulties retrieving words she should know.  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  She was an award Research officer, trade union, a gifted provider and now she can't even find the words she needs.  Terri Rodriguez also states that she has had many loses in her life.  She's had two miscarriages.    Terri Rodriguez has osteoarthritis and experiences chronic pain.  Her husband is tired of her "groaning and moaning."  Terri Rodriguez isn't able to get around independently in most situations, she is aided by a cane and a scooter if at the grocery store.      Mental Status Exam: Appearance:   Casual     Behavior:  Appropriate  Motor:  Normal  Speech/Language:   Clear and Coherent  Affect:  Appropriate  Mood:  anxious and depressed  Thought process:  normal  Thought content:    WNL  Sensory/Perceptual disturbances:    WNL  Orientation:  oriented to person, place, time/date, and situation  Attention:  Good  Concentration:  Good  Memory:  Undetermined memory loss - word retrival  Fund of knowledge:   Good  Insight:    Good  Judgment:   Good  Impulse Control:  Good    Reported Symptoms: sleep disruption, appetite disruption,   Risk Assessment: Danger to Self:  No Self-injurious Behavior: No Danger to Others: No Duty to Warn:no Physical  Aggression / Violence:No  Access to Firearms a concern: No   Substance Abuse History: Current substance abuse: No     Past Psychiatric History:   Previous psychological history is significant for anxiety and depression Outpatient Providers: She was in therapy for 5 yrs. When she began to have marital difficulties with Husband # 0. History of Psych Hospitalization: No  Psychological Testing:  n/a    Abuse History:  Victim of: Yes.  , emotional   Report needed: No. Victim of Neglect:No. Perpetrator of  n/a   Witness / Exposure to Domestic Violence: No   Protective Services Involvement: No  Witness to [employer] Violence:  No   Family History:  Family History  Problem Relation Age of Onset   Rheum arthritis Mother    Diabetes Mother    Heart failure Mother    Thrombocytopenia Mother    Sleep apnea Mother    Ulcers Mother        PUD   Deep vein thrombosis Father    Pulmonary embolism Father    Osteoarthritis Father    Atrial fibrillation Father    Dementia Father    Sleep apnea Father    Sleep apnea Brother    Obesity Brother    Sleep apnea Brother    Obesity Brother    Atrial fibrillation Brother    Colon cancer Maternal Aunt 52  Colon cancer Maternal Aunt 90   Congenital adrenal hyperplasia Grandchild    Pancreatic cancer Other    Uterine cancer Other    Leukemia Other    Inflammatory bowel disease Other        aunt   Esophageal cancer Neg Hx    Rectal cancer Neg Hx    Stomach cancer Neg Hx    Colon polyps Neg Hx     Living situation: the patient lives with their spouse  Sexual Orientation: Straight  Relationship Status: married  Name of spouse / other: Blakelie is married to Terri Rodriguez (71) they've been married for 43 years.   If a parent, number of children / ages: 1  They have one child together Terri Rodriguez (42) and two granddaughter (11 & 5) and is married to [employer] a Optometrist.  They live close by her in [place].    Support Systems: spouse friends Adult  child  Financial Stress:  No   Income/Employment/Disability: Dance movement psychotherapist and Occupational psychologist Service: No   Educational History: Education: post Engineer, maintenance (IT) work or Engineer, technical sales: unknown  Any cultural differences that may affect / interfere with treatment:  not applicable   Recreation/Hobbies: unknown  Stressors: Health problems    Strengths: Supportive Relationships, Family, Friends, Journalist, newspaper, and Able to Communicate Effectively  Barriers:  None   Legal History: Pending legal issue / charges: The patient has no significant history of legal issues. History of legal issue / charges:  n/a  Medical History/Surgical History: reviewed Past Medical History:  Diagnosis Date   ALLERGIC RHINITIS    Allergy    Anginal pain (HCC)    Blood transfusion without reported diagnosis    Cataract    small right   Cholelithiasis 06/09/2008   gallstones 2009 on ct scan   Complication of anesthesia 2012   went to ICU after bilateral knees , unstable vital signs oct 2012, has surgery since did ok   Coronary artery disease    30 % narrowing lad   Diverticulitis    EPICONDYLITIS, LATERAL    osteoarthritis, previous joint replacements   Fall 10/2018   GERD    Hepatic hemangioma 06/09/2008   Hiatal hernia 03/2000   Hx of adenomatous polyp of colon 06/05/2008   Hx of cardiac catheterization 2008   clean coronarys DrKelly    Hx of chest pain 2003   neg cath remot hx of narrowwing lad in 2003 nl 2008, none in many years   HYPERLIPIDEMIA    IBS (irritable bowel syndrome)    LEUKOPENIA, CHRONIC    Mononucleosis 1963   RAYNAUD'S SYNDROME, HX OF    improved after cardiac meds initiated   Rosacea    facial   Sleep apnea    SLEEP APNEA, OBSTRUCTIVE    cpap, settings "automatic" settings 11-12   Subacute thyroiditis    UNSPECIFIED ANEMIA     Past Surgical History:  Procedure Laterality Date   ABDOMINAL HYSTERECTOMY  1993   bso    ANTERIOR CERVICAL DECOMP/DISCECTOMY FUSION N/A 12/20/2022   Procedure: Cervical four-five Cervical five-six Cervical six-seven Anterior Cervical Decompression Fusion;  Surgeon: Barnett Abu, MD;  Location: [facility];  Service: Neurosurgery;  Laterality: N/A;   ANTERIOR FUSION CERVICAL SPINE N/A 12/20/2022   BACK SURGERY  2008   l 3 to l4 l4 to l5   BARTHOLIN GLAND CYST EXCISION Right 09/09/2019   Procedure: EXCISION OF VULVAR MASS TIMES TWO;  Surgeon: Jerene Bears, MD;  Location: [facility];  Service: Gynecology;  Laterality: Right;   CARDIAC CATHETERIZATION  04/07/2007   Noncritical coronary artery disease. Contiue medical therapy.   CARDIAC CATHETERIZATION  12/03/2007   Normal LV function. Mild angiographic mitral valve prolapse. Normal coronary arteries.   CARDIOVASCULAR STRESS TEST  11/09/2007   Moderate ischemia in Mid Anterior, Mid Anteroseptal, Apical Anterior, and Apical Septal regions. EKG negative for ischemia.   CATARACT EXTRACTION Right 04/25/2023   CESAREAN SECTION  1985, 1989   COLON RESECTION Left 01/03/2022   Procedure: COLON RESECTION;  Surgeon: Quentin Ore, MD;  Location: [facility];  Service: General;  Laterality: Left;   COLOSTOMY  01/03/2022   Procedure: COLOSTOMY;  Surgeon: Quentin Ore, MD;  Location: [facility];  Service: General;;   dental implants  11/2002,11/2011,11/2012   dental implants     x 6   DENTAL SURGERY     DILATION AND CURETTAGE OF UTERUS     d and e 1988 and 1990   JOINT REPLACEMENT  09/21/2011   bilateral knee   KNEE ARTHROSCOPY     bilateral   LAPAROTOMY N/A 01/03/2022   Procedure: EXPLORATORY LAPAROTOMY;  Surgeon: Quentin Ore, MD;  Location: [facility];  Service: General;  Laterality: N/A;   lumbar surgery fixation with disc replacement  09/2007   Left   NASAL SEPTOPLASTY W/ TURBINOPLASTY  05/2000   NOCTURNAL POLYSOMNOGRAM  12/31/2006   Severe obstructive sleep apnea. AHI-87/hr   REPLACEMENT TOTAL KNEE  2012    bilateral   TONSILLECTOMY  1952   adenoids too   TOTAL HIP ARTHROPLASTY Left 10/21/2013   Procedure: LEFT TOTAL HIP ARTHROPLASTY ANTERIOR APPROACH;  Surgeon: Loanne Drilling, MD;  Location: [facility];  Service: Orthopedics;  Laterality: Left;   TOTAL HIP ARTHROPLASTY Right 07/16/2014   Procedure: RIGHT TOTAL HIP ARTHROPLASTY ANTERIOR APPROACH;  Surgeon: Loanne Drilling, MD;  Location: [facility];  Service: Orthopedics;  Laterality: Right;   TRANSTHORACIC ECHOCARDIOGRAM  11/09/2007   EF 58%, LV systolic function normal. Mild aortic root dilation.   WRIST SURGERY Left 04/03/2014   ORIF "distal radial head fracture"   XI ROBOTIC ASSISTED COLOSTOMY TAKEDOWN N/A 09/26/2022   Procedure: ROBOTIC OSTOMY REVERSAL;  Surgeon: Quentin Ore, MD;  Location: [facility];  Service: General;  Laterality: N/A;    Medications: Current Outpatient Medications  Medication Sig Dispense Refill   acetaminophen (TYLENOL) 500 MG tablet Take 1 tablet (500 mg total) by mouth every 6 (six) hours as needed for fever or mild pain. (Patient taking differently: Take 500 mg by mouth every morning.) 30 tablet 0   amLODipine (NORVASC) 5 MG tablet TAKE 1 TABLET BY MOUTH EVERY DAY 90 tablet 3   amoxicillin (AMOXIL) 500 MG capsule Take 2,000 mg by mouth See admin instructions. Take 2000 mg by mouth 1 hour prior to dental procedures.     aspirin EC 81 MG tablet Take 81 mg by mouth daily.     atorvastatin (LIPITOR) 20 MG tablet Take 1 tablet (20 mg total) by mouth daily. 90 tablet 3   Azelaic Acid 15 % cream Apply 1 application topically 2 (two) times daily as needed (rosacea). After skin is thoroughly washed and patted dry, gently but thoroughly massage a thin film of azelaic acid cream into the affected area t daily,after shower.     calcium elemental as carbonate (TUMS ULTRA 1000) 400 MG chewable tablet Chew 1,000 mg by mouth daily as needed for heartburn.     carvedilol (COREG)  3.125 MG tablet TAKE 1 TABLET BY MOUTH 2 TIMES DAILY  WITH A MEAL 180 tablet 3   celecoxib (CELEBREX) 200 MG capsule Take 1 capsule (200 mg total) by mouth daily. May alternate with 100 mg 90 capsule 0   DULoxetine (CYMBALTA) 60 MG capsule TAKE 1 CAPSULE BY MOUTH EVERY DAY 90 capsule 0   fexofenadine (ALLEGRA) 180 MG tablet Take 180 mg by mouth daily with supper.     metroNIDAZOLE (METROGEL) 1 % gel Apply 1 Application topically daily as needed (rosacea).     Multiple Vitamin (MULTIVITAMIN WITH MINERALS) TABS tablet Take 1 tablet by mouth daily. Centrum Women's no Iron     nirmatrelvir/ritonavir (PAXLOVID) 20 x 150 MG & 10 x 100MG  TABS Patient GFR is 89  Take nirmatrelvir (150 mg) 2 tablet(s) twice daily for 5 days and ritonavir (100 mg) one tablet twice daily for 5 days.take  Medication together. 30 tablet 0   nitroGLYCERIN (NITROSTAT) 0.4 MG SL tablet PLACE 1 TABLET UNDER THE TONGUE EVERY 5 MINUTES AS NEEDED FOR CHEST PAIN 25 tablet 0   omeprazole (PRILOSEC) 20 MG capsule Take 40 mg by mouth every morning.     psyllium (REGULOID) 0.52 g capsule Take 0.52 g by mouth 2 (two) times daily as needed (bowel).     ramipril (ALTACE) 10 MG capsule TAKE 1 CAPSULE BY MOUTH EVERY DAY 90 capsule 3   valACYclovir (VALTREX) 1000 MG tablet TAKE 2 TABLET BY MOUTH Q12 hours X 2 DOSES WITH FEVER BLISTERS 30 tablet 1   No current facility-administered medications for this visit.    Allergies  Allergen Reactions   Codeine Nausea Only   Crab [Shellfish Allergy] Itching and Nausea And Vomiting    Palm and sole of feet itch- stone crab  Per pt- betadine ok    Diagnoses:   Major Depressive Disorder, recurrent, moderate Anxiety due to multiple medical problems  Plan of Care: Selah Mauri is an 77 y.o. MWW who had a medical crisis in 2023, and as a result has experienced cognitive changes, depression and anxiety.  In addition to these cognitive and mood issues, she is experiencing chronic pain due to osteoarthritis.  It is believed that patient would benefit from a  weekly individual psychotherapy which would focus on providing support around issues of related to loss, cognitive issues, anxiety and depression. Treatment would work to build positive coping strategies and skills which would allow patient to manage mood states more effectively as well as prevent relapse. Psychotherapy will also focus on past experiences with her medical crisis, resulting cognitive deficits (TBI?), and help patient garner the support she needs from her spouse and friends. Therapist will monitor need for medication.  I highly recommend a thorough neuropsychological evaluation and neurological study by a neurologist to determine the extent of her cognitive loses.  Hilma Favors, PhD

## 2023-10-03 ENCOUNTER — Other Ambulatory Visit: Payer: Self-pay | Admitting: Internal Medicine

## 2023-10-03 ENCOUNTER — Ambulatory Visit (INDEPENDENT_AMBULATORY_CARE_PROVIDER_SITE_OTHER): Payer: Medicare Other | Admitting: Internal Medicine

## 2023-10-03 ENCOUNTER — Encounter: Payer: Self-pay | Admitting: Internal Medicine

## 2023-10-03 VITALS — BP 100/60 | HR 69 | Temp 97.8°F | Ht 64.0 in | Wt 246.4 lb

## 2023-10-03 DIAGNOSIS — R5381 Other malaise: Secondary | ICD-10-CM

## 2023-10-03 DIAGNOSIS — G8929 Other chronic pain: Secondary | ICD-10-CM | POA: Diagnosis not present

## 2023-10-03 DIAGNOSIS — Z79899 Other long term (current) drug therapy: Secondary | ICD-10-CM

## 2023-10-03 DIAGNOSIS — M961 Postlaminectomy syndrome, not elsewhere classified: Secondary | ICD-10-CM

## 2023-10-03 DIAGNOSIS — D72819 Decreased white blood cell count, unspecified: Secondary | ICD-10-CM

## 2023-10-03 DIAGNOSIS — H9193 Unspecified hearing loss, bilateral: Secondary | ICD-10-CM

## 2023-10-03 DIAGNOSIS — M159 Polyosteoarthritis, unspecified: Secondary | ICD-10-CM

## 2023-10-03 DIAGNOSIS — R413 Other amnesia: Secondary | ICD-10-CM

## 2023-10-03 DIAGNOSIS — R739 Hyperglycemia, unspecified: Secondary | ICD-10-CM

## 2023-10-03 DIAGNOSIS — F32A Depression, unspecified: Secondary | ICD-10-CM

## 2023-10-03 MED ORDER — VALACYCLOVIR HCL 1 G PO TABS: ORAL_TABLET | ORAL | 3 refills | Status: AC

## 2023-10-03 NOTE — Progress Notes (Signed)
Chief Complaint  Patient presents with   Medical Management of Chronic Issues    HPI: Terri Rodriguez 77 y.o. come in for Chronic disease management  and issues  here with spouse .  PT had been helping exp water therapy uncertain if  can but would like to continue. Counseling   and advised to try a high dose vitamin supplement  had been taking  centrum silver type  vits    Has been referred to neuropsych  for evaluation [facility]  oxycodone helps  for acute but has only taken 1-2  previous had been in  care via NS dr Danielle Dess before had her  abd catastrophe .  Memory and  mood  seems to be getting worse   work finding worse after  hosp in 2023  Diet is balanced . Had  bx of r middle  toe lesion  and poss scca in situe  awaiting other review . Hearing is down  not wearing aids today .  ROS: See pertinent positives and negatives per HPI. Had righ si are pain on arising from chair without fall . A week ago . Getting better .  Past Medical History:  Diagnosis Date   ALLERGIC RHINITIS    Allergy    Anginal pain (HCC)    Blood transfusion without reported diagnosis    Cataract    small right   Cholelithiasis 06/09/2008   gallstones 2009 on ct scan   Complication of anesthesia 2012   went to ICU after bilateral knees , unstable vital signs oct 2012, has surgery since did ok   Coronary artery disease    30 % narrowing lad   Diverticulitis    EPICONDYLITIS, LATERAL    osteoarthritis, previous joint replacements   Fall 10/2018   GERD    Hepatic hemangioma 06/09/2008   Hiatal hernia 03/2000   Hx of adenomatous polyp of colon 06/05/2008   Hx of cardiac catheterization 2008   clean coronarys DrKelly    Hx of chest pain 2003   neg cath remot hx of narrowwing lad in 2003 nl 2008, none in many years   HYPERLIPIDEMIA    IBS (irritable bowel syndrome)    LEUKOPENIA, CHRONIC    Mononucleosis 1963   RAYNAUD'S SYNDROME, HX OF    improved after cardiac meds initiated    Rosacea    facial   Sleep apnea    SLEEP APNEA, OBSTRUCTIVE    cpap, settings "automatic" settings 11-12   Subacute thyroiditis    UNSPECIFIED ANEMIA     Family History  Problem Relation Age of Onset   Rheum arthritis Mother    Diabetes Mother    Heart failure Mother    Thrombocytopenia Mother    Sleep apnea Mother    Ulcers Mother        PUD   Deep vein thrombosis Father    Pulmonary embolism Father    Osteoarthritis Father    Atrial fibrillation Father    Dementia Father    Sleep apnea Father    Sleep apnea Brother    Obesity Brother    Sleep apnea Brother    Obesity Brother    Atrial fibrillation Brother    Colon cancer Maternal Aunt 59   Colon cancer Maternal Aunt 90   Congenital adrenal hyperplasia Grandchild    Pancreatic cancer Other    Uterine cancer Other    Leukemia Other    Inflammatory bowel disease Other  aunt   Esophageal cancer Neg Hx    Rectal cancer Neg Hx    Stomach cancer Neg Hx    Colon polyps Neg Hx     Social History   Socioeconomic History   Marital status: Married    Spouse name: Not on file   Number of children: 1   Years of education: Not on file   Highest education level: Professional school degree (e.g., MD, DDS, DVM, JD)  Occupational History   Occupation: Physician  Tobacco Use   Smoking status: Never   Smokeless tobacco: Never  Vaping Use   Vaping status: Never Used  Substance and Sexual Activity   Alcohol use: Yes    Alcohol/week: 7.0 standard drinks of alcohol    Types: 7 Glasses of wine per week   Drug use: No   Sexual activity: Yes    Partners: Male    Birth control/protection: Surgical    Comment: TAH/BSO  Other Topics Concern   Not on file  Social History Narrative   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   Social Determinants of Health   Financial Resource Strain: Low Risk  (09/27/2023)   Overall Financial Resource Strain  (CARDIA)    Difficulty of Paying Living Expenses: Not hard at all  Food Insecurity: No Food Insecurity (09/27/2023)   Hunger Vital Sign    Worried About Running Out of Food in the Last Year: Never true    Ran Out of Food in the Last Year: Never true  Transportation Needs: No Transportation Needs (09/27/2023)   PRAPARE - Administrator, Civil Service (Medical): No    Lack of Transportation (Non-Medical): No  Physical Activity: Inactive (09/27/2023)   Exercise Vital Sign    Days of Exercise per Week: 0 days    Minutes of Exercise per Session: 0 min  Stress: No Stress Concern Present (09/27/2023)   [employer] of Occupational Health - Occupational Stress Questionnaire    Feeling of Stress : Only a little  Social Connections: Socially Integrated (09/27/2023)   Social Connection and Isolation Panel [NHANES]    Frequency of Communication with Friends and Family: Twice a week    Frequency of Social Gatherings with Friends and Family: Once a week    Attends Religious Services: More than 4 times per year    Active Member of [employer] or Organizations: Yes    Attends Banker Meetings: 1 to 4 times per year    Marital Status: Married    Outpatient Medications Prior to Visit  Medication Sig Dispense Refill   acetaminophen (TYLENOL) 500 MG tablet Take 1 tablet (500 mg total) by mouth every 6 (six) hours as needed for fever or mild pain. (Patient taking differently: Take 500 mg by mouth every morning.) 30 tablet 0   amLODipine (NORVASC) 5 MG tablet TAKE 1 TABLET BY MOUTH EVERY DAY 90 tablet 3   amoxicillin (AMOXIL) 500 MG capsule Take 2,000 mg by mouth See admin instructions. Take 2000 mg by mouth 1 hour prior to dental procedures.     aspirin EC 81 MG tablet Take 81 mg by mouth daily.     atorvastatin (LIPITOR) 20 MG tablet Take 1 tablet (20 mg total) by mouth daily. 90 tablet 3   Azelaic Acid 15 % cream Apply 1 application topically 2 (two) times daily as needed  (rosacea). After skin is thoroughly  washed and patted dry, gently but thoroughly massage a thin film of azelaic acid cream into the affected area t daily,after shower.     calcium elemental as carbonate (TUMS ULTRA 1000) 400 MG chewable tablet Chew 1,000 mg by mouth daily as needed for heartburn.     carvedilol (COREG) 3.125 MG tablet TAKE 1 TABLET BY MOUTH 2 TIMES DAILY WITH A MEAL 180 tablet 3   celecoxib (CELEBREX) 200 MG capsule Take 1 capsule (200 mg total) by mouth daily. May alternate with 100 mg 90 capsule 0   DULoxetine (CYMBALTA) 60 MG capsule Take 1 capsule (60 mg total) by mouth daily. 90 capsule 0   fexofenadine (ALLEGRA) 180 MG tablet Take 180 mg by mouth daily with supper.     metroNIDAZOLE (METROGEL) 1 % gel Apply 1 Application topically daily as needed (rosacea).     Multiple Vitamin (MULTIVITAMIN WITH MINERALS) TABS tablet Take 1 tablet by mouth daily. Centrum Women's no Iron     nitroGLYCERIN (NITROSTAT) 0.4 MG SL tablet PLACE 1 TABLET UNDER THE TONGUE EVERY 5 MINUTES AS NEEDED FOR CHEST PAIN 25 tablet 0   omeprazole (PRILOSEC) 20 MG capsule Take 40 mg by mouth every morning.     psyllium (REGULOID) 0.52 g capsule Take 0.52 g by mouth 2 (two) times daily as needed (bowel).     ramipril (ALTACE) 10 MG capsule TAKE 1 CAPSULE BY MOUTH EVERY DAY 90 capsule 3   valACYclovir (VALTREX) 1000 MG tablet TAKE 2 TABLET BY MOUTH Q12 hours X 2 DOSES WITH FEVER BLISTERS 30 tablet 1   nirmatrelvir/ritonavir (PAXLOVID) 20 x 150 MG & 10 x 100MG  TABS Patient GFR is 89  Take nirmatrelvir (150 mg) 2 tablet(s) twice daily for 5 days and ritonavir (100 mg) one tablet twice daily for 5 days.take  Medication together. 30 tablet 0   No facility-administered medications prior to visit.     EXAM:  BP 100/60 (BP Location: Right Arm, Patient Position: Sitting, Cuff Size: Large)   Pulse 69   Temp 97.8 F (36.6 C) (Oral)   Ht 5\' 4"  (1.626 m)   Wt 246 lb 6.4 oz (111.8 kg)   SpO2 98%   BMI 42.29 kg/m    Body mass index is 42.29 kg/m.  GENERAL: vitals reviewed and listed above, alert, oriented, appears well hydrated and in no acute distress some work finding and dec hearing  HEENT: atraumatic, conjunctiva  clear, no obvious abnormalities on inspection of external nose and ears OP : no lesion edema or exudate  to have another tooth implant left upper NECK: no obvious masses on inspection palpation  LUNGS: clear to auscultation bilaterally, no wheezes, rales or rhonchi, good air movement CV: HRRR, no clubbing cyanosis or  peripheral edema nl cap refill  MS: moves all extremities without noticeable focal  abnormality walking  with cane and limping  toe wrapped  hard to remove  reviewed   pix phone from spouse Back  right si area mild tender  PSYCH: pleasant and cooperative, no obvious depression or anxiety Lab Results  Component Value Date   WBC 3.3 (L) 04/10/2023   HGB 12.3 04/10/2023   HCT 37.0 04/10/2023   PLT 228 04/10/2023   GLUCOSE 107 (H) 04/10/2023   CHOL 162 04/10/2023   TRIG 68 04/10/2023   HDL 85 04/10/2023   LDLCALC 64 04/10/2023   ALT 33 (H) 04/10/2023   AST 48 (H) 04/10/2023   NA 143 04/10/2023   K 5.0 04/10/2023  CL 106 04/10/2023   CREATININE 0.69 04/10/2023   BUN 18 04/10/2023   CO2 27 04/10/2023   TSH 1.58 08/04/2021   INR 1.0 04/13/2021   HGBA1C 5.3 06/01/2023   BP Readings from Last 3 Encounters:  10/03/23 100/60  06/01/23 106/66  04/18/23 130/64   Lab Results  Component Value Date   VITAMINB12 1,058 (H) 08/04/2021    ASSESSMENT AND PLAN:  Discussed the following assessment and plan:  Other chronic pain - Plan: Amb Referral to Clinical Pharmacist  Osteoarthritis of multiple joints, unspecified osteoarthritis type  Physical deconditioning  Lumbar post-laminectomy syndrome  Complaints of memory disturbance - with depressive sx  Medication management - Plan: Amb Referral to Clinical Pharmacist  Decreased hearing of both ears - wear you  aids to optimize cognition  Depression, unspecified depression type - Plan: Amb Referral to Clinical Pharmacist [facility]  need to be followed  Scca   as per derm  She may benefit from  gene testing  to help optimize  depression medication  will ask pharmacy consult and  order  for help  She is has had high level intelligence and now having word finding issue but hearing also not being supported . She relates deterioration  to hospitalization  but spouse says was  modestly decrease previous and then worse . Neuro psych eval would be quite helful  Consider  mri of head  .  Ok to take  vitamins presented  but as long as  not toxic  possible that poor absorption after bowel resection. Plan labs in 3+ months  to check  45 , minutes  review assess and planning all of above  -Patient advised to return or notify health care team  if  new concerns arise.  Patient Instructions  Good to see you today  Continue pt and or water  therapy . Ok to try  new vits  Will order  lab  in about 3 months and go from there Agree with  getting neuropsych evaluation. PT  can send  plan for reorder.  Neta Mends. Kloie Whiting M.D.

## 2023-10-03 NOTE — Patient Instructions (Addendum)
Good to see you today  Continue pt and or water  therapy . Ok to try  new vits  Will order  lab  in about 3 months and go from there Agree with  getting neuropsych evaluation. PT  can send  plan for reorder.

## 2023-10-03 NOTE — Progress Notes (Signed)
Future labs from 10/24 visit fu

## 2023-10-05 ENCOUNTER — Ambulatory Visit: Payer: Medicare Other | Admitting: Psychology

## 2023-10-05 DIAGNOSIS — F331 Major depressive disorder, recurrent, moderate: Secondary | ICD-10-CM

## 2023-10-05 DIAGNOSIS — F419 Anxiety disorder, unspecified: Secondary | ICD-10-CM

## 2023-10-05 NOTE — Addendum Note (Signed)
Addended by: Marin Olp ANN on: 10/05/2023 04:58 PM   Modules accepted: Level of Service

## 2023-10-05 NOTE — Progress Notes (Signed)
PROGRESS NOTE:  Treatment Planning  Name: Terri Rodriguez Date: 10/05/2023 MRN: [id number] DOB: November 05, 1946 PCP: Madelin Headings, MD  Time spent: 12:00 PM - 12:59 PM  Guardian/Payee:  Self    Paperwork requested: Yes   Today I met with [name] for in office face-to-face individual psychotherapy.  Reason for Visit /Presenting Problem:  Terri Rodriguez is an 77 y.o. MWW who had a medical crisis and "hasn't been the same since."  In 2023, she went to see a G/I, was misdiagnosed, her diverticuli ruptured and she became septic.  The most distressing sequelae has been the changes to her cognitive health.  In particular, it seems she is having difficulties retrieving words she should know.  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  She was an award Research officer, trade union, a gifted provider and now she can't even find the words she needs.  Zakyia also states that she has had many loses in her life.  She's had two miscarriages.    Manesha has osteoarthritis and experiences chronic pain.  Her husband is tired of her "groaning and moaning."  Rozann isn't able to get around independently in most situations, she is aided by a cane and a scooter if at the grocery store.      Mental Status Exam: Appearance:   Casual     Behavior:  Appropriate  Motor:  Normal  Speech/Language:   Clear and Coherent  Affect:  Appropriate  Mood:  anxious and depressed  Thought process:  normal  Thought content:    WNL  Sensory/Perceptual disturbances:    WNL  Orientation:  oriented to person, place, time/date, and situation  Attention:  Good  Concentration:  Good  Memory:  Undetermined memory loss - word retrival  Fund of knowledge:   Good  Insight:    Good  Judgment:   Good  Impulse Control:  Good    Reported Symptoms: sleep disruption, appetite disruption,    Family History:  Family History  Problem Relation Age of Onset   Rheum arthritis Mother    Diabetes Mother    Heart  failure Mother    Thrombocytopenia Mother    Sleep apnea Mother    Ulcers Mother        PUD   Deep vein thrombosis Father    Pulmonary embolism Father    Osteoarthritis Father    Atrial fibrillation Father    Dementia Father    Sleep apnea Father    Sleep apnea Brother    Obesity Brother    Sleep apnea Brother    Obesity Brother    Atrial fibrillation Brother    Colon cancer Maternal Aunt 59   Colon cancer Maternal Aunt 90   Congenital adrenal hyperplasia Grandchild    Pancreatic cancer Other    Uterine cancer Other    Leukemia Other    Inflammatory bowel disease Other        aunt   Esophageal cancer Neg Hx    Rectal cancer Neg Hx    Stomach cancer Neg Hx    Colon polyps Neg Hx      Medical History/Surgical History: reviewed Past Medical History:  Diagnosis Date   ALLERGIC RHINITIS    Allergy    Anginal pain (HCC)    Blood transfusion without reported diagnosis    Cataract    small right   Cholelithiasis 06/09/2008   gallstones 2009 on ct scan   Complication of anesthesia  2012   went to ICU after bilateral knees , unstable vital signs oct 2012, has surgery since did ok   Coronary artery disease    30 % narrowing lad   Diverticulitis    EPICONDYLITIS, LATERAL    osteoarthritis, previous joint replacements   Fall 10/2018   GERD    Hepatic hemangioma 06/09/2008   Hiatal hernia 03/2000   Hx of adenomatous polyp of colon 06/05/2008   Hx of cardiac catheterization 2008   clean coronarys DrKelly    Hx of chest pain 2003   neg cath remot hx of narrowwing lad in 2003 nl 2008, none in many years   HYPERLIPIDEMIA    IBS (irritable bowel syndrome)    LEUKOPENIA, CHRONIC    Mononucleosis 1963   RAYNAUD'S SYNDROME, HX OF    improved after cardiac meds initiated   Rosacea    facial   Sleep apnea    SLEEP APNEA, OBSTRUCTIVE    cpap, settings "automatic" settings 11-12   Subacute thyroiditis    UNSPECIFIED ANEMIA     Past Surgical History:  Procedure  Laterality Date   ABDOMINAL HYSTERECTOMY  1993   bso   ANTERIOR CERVICAL DECOMP/DISCECTOMY FUSION N/A 12/20/2022   Procedure: Cervical four-five Cervical five-six Cervical six-seven Anterior Cervical Decompression Fusion;  Surgeon: Barnett Abu, MD;  Location: [facility];  Service: Neurosurgery;  Laterality: N/A;   ANTERIOR FUSION CERVICAL SPINE N/A 12/20/2022   BACK SURGERY  2008   l 3 to l4 l4 to l5   BARTHOLIN GLAND CYST EXCISION Right 09/09/2019   Procedure: EXCISION OF VULVAR MASS TIMES TWO;  Surgeon: Jerene Bears, MD;  Location: [facility];  Service: Gynecology;  Laterality: Right;   CARDIAC CATHETERIZATION  04/07/2007   Noncritical coronary artery disease. Contiue medical therapy.   CARDIAC CATHETERIZATION  12/03/2007   Normal LV function. Mild angiographic mitral valve prolapse. Normal coronary arteries.   CARDIOVASCULAR STRESS TEST  11/09/2007   Moderate ischemia in Mid Anterior, Mid Anteroseptal, Apical Anterior, and Apical Septal regions. EKG negative for ischemia.   CATARACT EXTRACTION Right 04/25/2023   CESAREAN SECTION  1985, 1989   COLON RESECTION Left 01/03/2022   Procedure: COLON RESECTION;  Surgeon: Quentin Ore, MD;  Location: [facility];  Service: General;  Laterality: Left;   COLOSTOMY  01/03/2022   Procedure: COLOSTOMY;  Surgeon: Quentin Ore, MD;  Location: [facility];  Service: General;;   dental implants  11/2002,11/2011,11/2012   dental implants     x 6   DENTAL SURGERY     DILATION AND CURETTAGE OF UTERUS     d and e 1988 and 1990   JOINT REPLACEMENT  09/21/2011   bilateral knee   KNEE ARTHROSCOPY     bilateral   LAPAROTOMY N/A 01/03/2022   Procedure: EXPLORATORY LAPAROTOMY;  Surgeon: Quentin Ore, MD;  Location: [facility];  Service: General;  Laterality: N/A;   lumbar surgery fixation with disc replacement  09/2007   Left   NASAL SEPTOPLASTY W/ TURBINOPLASTY  05/2000   NOCTURNAL POLYSOMNOGRAM  12/31/2006   Severe obstructive  sleep apnea. AHI-87/hr   REPLACEMENT TOTAL KNEE  2012   bilateral   TONSILLECTOMY  1952   adenoids too   TOTAL HIP ARTHROPLASTY Left 10/21/2013   Procedure: LEFT TOTAL HIP ARTHROPLASTY ANTERIOR APPROACH;  Surgeon: Loanne Drilling, MD;  Location: [facility];  Service: Orthopedics;  Laterality: Left;   TOTAL HIP ARTHROPLASTY Right 07/16/2014   Procedure: RIGHT TOTAL HIP ARTHROPLASTY  ANTERIOR APPROACH;  Surgeon: Loanne Drilling, MD;  Location: [facility];  Service: Orthopedics;  Laterality: Right;   TRANSTHORACIC ECHOCARDIOGRAM  11/09/2007   EF 58%, LV systolic function normal. Mild aortic root dilation.   WRIST SURGERY Left 04/03/2014   ORIF "distal radial head fracture"   XI ROBOTIC ASSISTED COLOSTOMY TAKEDOWN N/A 09/26/2022   Procedure: ROBOTIC OSTOMY REVERSAL;  Surgeon: Quentin Ore, MD;  Location: [facility];  Service: General;  Laterality: N/A;    Medications: Current Outpatient Medications  Medication Sig Dispense Refill   acetaminophen (TYLENOL) 500 MG tablet Take 1 tablet (500 mg total) by mouth every 6 (six) hours as needed for fever or mild pain. (Patient taking differently: Take 500 mg by mouth every morning.) 30 tablet 0   amLODipine (NORVASC) 5 MG tablet TAKE 1 TABLET BY MOUTH EVERY DAY 90 tablet 3   amoxicillin (AMOXIL) 500 MG capsule Take 2,000 mg by mouth See admin instructions. Take 2000 mg by mouth 1 hour prior to dental procedures.     aspirin EC 81 MG tablet Take 81 mg by mouth daily.     atorvastatin (LIPITOR) 20 MG tablet Take 1 tablet (20 mg total) by mouth daily. 90 tablet 3   Azelaic Acid 15 % cream Apply 1 application topically 2 (two) times daily as needed (rosacea). After skin is thoroughly washed and patted dry, gently but thoroughly massage a thin film of azelaic acid cream into the affected area t daily,after shower.     calcium elemental as carbonate (TUMS ULTRA 1000) 400 MG chewable tablet Chew 1,000 mg by mouth daily as needed for heartburn.     carvedilol  (COREG) 3.125 MG tablet TAKE 1 TABLET BY MOUTH 2 TIMES DAILY WITH A MEAL 180 tablet 3   celecoxib (CELEBREX) 200 MG capsule Take 1 capsule (200 mg total) by mouth daily. May alternate with 100 mg 90 capsule 0   DULoxetine (CYMBALTA) 60 MG capsule TAKE 1 CAPSULE BY MOUTH ONCE DAILY 90 capsule 0   fexofenadine (ALLEGRA) 180 MG tablet Take 180 mg by mouth daily with supper.     metroNIDAZOLE (METROGEL) 1 % gel Apply 1 Application topically daily as needed (rosacea).     Multiple Vitamin (MULTIVITAMIN WITH MINERALS) TABS tablet Take 1 tablet by mouth daily. Centrum Women's no Iron     nitroGLYCERIN (NITROSTAT) 0.4 MG SL tablet PLACE 1 TABLET UNDER THE TONGUE EVERY 5 MINUTES AS NEEDED FOR CHEST PAIN 25 tablet 0   omeprazole (PRILOSEC) 20 MG capsule Take 40 mg by mouth every morning.     psyllium (REGULOID) 0.52 g capsule Take 0.52 g by mouth 2 (two) times daily as needed (bowel).     ramipril (ALTACE) 10 MG capsule TAKE 1 CAPSULE BY MOUTH EVERY DAY 90 capsule 3   valACYclovir (VALTREX) 1000 MG tablet TAKE 2 TABLET BY MOUTH Q12 hours X 2 DOSES WITH FEVER BLISTERS 30 tablet 3   No current facility-administered medications for this visit.    Allergies  Allergen Reactions   Codeine Nausea Only   Crab [Shellfish Allergy] Itching and Nausea And Vomiting    Palm and sole of feet itch- stone crab  Per pt- betadine ok     Individualized Treatment Plan         Strengths: intelligent, very loving, ambitious, resourceful  Supports: spouse   Goal/Needs for Treatment:  In order of importance to patient 1) Learn and implement strategies and coping skill to reduce depression 2) Adjusting to major life  changes due to her health 3) Learn and implement strategies and coping skill to reduce anxiety   Client Statement of Needs:  Pt would like to feel happier, adjust to the major changes she is experiencing due to her health, reduce anxiety   Treatment Level: Outpatient Individual Psychotherapy  Symptoms:    Client Treatment Preferences: Prefers in person   Healthcare consumer's goal for treatment:  Psychologist, Hilma Favors, Ph.D. will support the patient's ability to achieve the goals identified. Cognitive Behavioral Therapy, Dialectical Behavioral Therapy, Motivational Interviewing, Behavior Activation, and other evidenced-based practices will be used to promote progress towards healthy functioning.   Healthcare [name] consumer will: Actively participate in therapy, working towards healthy functioning.    *Justification for Continuation/Discontinuation of Goal: R=Revised, O=Ongoing, A=Achieved, D=Discontinued  Goal 1) Learn and implement strategies and coping skill to reduce depression  Likert rating baseline date : 10/05/2023 Target Date Goal Was reviewed Status Code Progress towards goal/Likert rating  10/04/2024                         O                        Goal 2) Adjusting to major life changes due to her health  Likert rating baseline date: 10/05/2023 Target Date Goal Was reviewed Status Code Progress towards goal/Likert rating  10/04/2024           O              Goal 3) Learn and implement strategies and coping skill to reduce anxiety  Likert rating baseline date: 10/05/2023 Target Date Goal Was reviewed Status Code Progress towards goal/Likert rating  10/04/2024           O                      This plan has been reviewed and created by the following participants:  This plan will be reviewed at least every 12 months. Date Behavioral Health Clinician Date Guardian/Patient   10/05/2023 Hilma Favors, Ph.D.  10/05/2023 [name]                   Diagnoses:   Major Depressive Disorder, recurrent, moderate Anxiety due to multiple medical problems  In session today, we created Kaytie's treatment plan.  We d/ her needs and set goals.  Dahja actively participated in the creation of her treatment plan and freely gave her consent.    Hilma Favors,  PhD

## 2023-10-06 ENCOUNTER — Other Ambulatory Visit: Payer: Self-pay | Admitting: Internal Medicine

## 2023-10-17 DIAGNOSIS — Z1231 Encounter for screening mammogram for malignant neoplasm of breast: Secondary | ICD-10-CM | POA: Diagnosis not present

## 2023-10-17 LAB — HM MAMMOGRAPHY

## 2023-10-18 ENCOUNTER — Ambulatory Visit: Payer: Medicare Other | Admitting: Psychology

## 2023-10-19 ENCOUNTER — Encounter: Payer: Self-pay | Admitting: Internal Medicine

## 2023-10-20 ENCOUNTER — Ambulatory Visit: Payer: Medicare Other

## 2023-10-20 DIAGNOSIS — G8929 Other chronic pain: Secondary | ICD-10-CM

## 2023-10-20 NOTE — Progress Notes (Signed)
   10/20/2023  Patient ID: Terri Rodriguez, female   DOB: 02/28/1946, 77 y.o.   MRN: [id number]  Patient presents in office for a [employer] buccal swab. Swab performed. Process explained to patient and she signed consent. Will notify patient once results are obtained or company will reach out to patient if there is a charge before testing.   Sherrill Raring, PharmD Clinical Pharmacist [phone]

## 2023-10-23 DIAGNOSIS — G4733 Obstructive sleep apnea (adult) (pediatric): Secondary | ICD-10-CM | POA: Diagnosis not present

## 2023-10-25 DIAGNOSIS — L309 Dermatitis, unspecified: Secondary | ICD-10-CM | POA: Diagnosis not present

## 2023-10-25 DIAGNOSIS — D485 Neoplasm of uncertain behavior of skin: Secondary | ICD-10-CM | POA: Diagnosis not present

## 2023-10-31 ENCOUNTER — Ambulatory Visit: Payer: Medicare Other | Admitting: Psychology

## 2023-10-31 DIAGNOSIS — F419 Anxiety disorder, unspecified: Secondary | ICD-10-CM | POA: Diagnosis not present

## 2023-10-31 DIAGNOSIS — F331 Major depressive disorder, recurrent, moderate: Secondary | ICD-10-CM

## 2023-10-31 NOTE — Progress Notes (Signed)
PROGRESS NOTE:  Name: Terri Rodriguez Date: 10/31/2023 MRN: [id number] DOB: 1946/07/29 PCP: Madelin Headings, MD  Time spent: 2:00 PM - 2:57 PM   Today I met with  Terri Rodriguez in remote video (WebEx) face-to-face individual psychotherapy.  Distance Site: Client's Home Orginating Site: Dr Odette Horns Remote Office Consent: Obtained verbal consent to transmit session remotely. Patient is aware of the inherent limitations in participating in virtual therapy.    Reason for Visit /Presenting Problem:  Terri Rodriguez is an 77 y.o. MWW who had a medical crisis and "hasn't been the same since."  In 2023, she went to see a G/I, was misdiagnosed, her diverticuli ruptured and she became septic.  The most distressing sequelae has been the changes to her cognitive health.  In particular, it seems she is having difficulties retrieving words she should know.  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  She was an award Research officer, trade union, a gifted provider and now she can't even find the words she needs.  Terri Rodriguez also states that she has had many loses in her life.  She's had two miscarriages.    Terri Rodriguez has osteoarthritis and experiences chronic pain.  Her husband is tired of her "groaning and moaning."  Terri Rodriguez isn't able to get around independently in most situations, she is aided by a cane and a scooter if at the grocery store.      Mental Status Exam: Appearance:   Casual     Behavior:  Appropriate  Motor:  Normal  Speech/Language:   Clear and Coherent  Affect:  Appropriate  Mood:  anxious and depressed  Thought process:  normal  Thought content:    WNL  Sensory/Perceptual disturbances:    WNL  Orientation:  oriented to person, place, time/date, and situation  Attention:  Good  Concentration:  Good  Memory:  Undetermined memory loss - word retrival  Fund of knowledge:   Good  Insight:    Good  Judgment:   Good  Impulse Control:  Good    Reported Symptoms: sleep  disruption, appetite disruption,    Family History:  Family History  Problem Relation Age of Onset   Rheum arthritis Mother    Diabetes Mother    Heart failure Mother    Thrombocytopenia Mother    Sleep apnea Mother    Ulcers Mother        PUD   Deep vein thrombosis Father    Pulmonary embolism Father    Osteoarthritis Father    Atrial fibrillation Father    Dementia Father    Sleep apnea Father    Sleep apnea Brother    Obesity Brother    Sleep apnea Brother    Obesity Brother    Atrial fibrillation Brother    Colon cancer Maternal Aunt 59   Colon cancer Maternal Aunt 90   Congenital adrenal hyperplasia Grandchild    Pancreatic cancer Other    Uterine cancer Other    Leukemia Other    Inflammatory bowel disease Other        aunt   Esophageal cancer Neg Hx    Rectal cancer Neg Hx    Stomach cancer Neg Hx    Colon polyps Neg Hx      Medical History/Surgical History: reviewed Past Medical History:  Diagnosis Date   ALLERGIC RHINITIS    Allergy    Anginal pain (HCC)    Blood transfusion without reported diagnosis  Cataract    small right   Cholelithiasis 06/09/2008   gallstones 2009 on ct scan   Complication of anesthesia 2012   went to ICU after bilateral knees , unstable vital signs oct 2012, has surgery since did ok   Coronary artery disease    30 % narrowing lad   Diverticulitis    EPICONDYLITIS, LATERAL    osteoarthritis, previous joint replacements   Fall 10/2018   GERD    Hepatic hemangioma 06/09/2008   Hiatal hernia 03/2000   Hx of adenomatous polyp of colon 06/05/2008   Hx of cardiac catheterization 2008   clean coronarys DrKelly    Hx of chest pain 2003   neg cath remot hx of narrowwing lad in 2003 nl 2008, none in many years   HYPERLIPIDEMIA    IBS (irritable bowel syndrome)    LEUKOPENIA, CHRONIC    Mononucleosis 1963   RAYNAUD'S SYNDROME, HX OF    improved after cardiac meds initiated   Rosacea    facial   Sleep apnea    SLEEP  APNEA, OBSTRUCTIVE    cpap, settings "automatic" settings 11-12   Subacute thyroiditis    UNSPECIFIED ANEMIA     Past Surgical History:  Procedure Laterality Date   ABDOMINAL HYSTERECTOMY  1993   bso   ANTERIOR CERVICAL DECOMP/DISCECTOMY FUSION N/A 12/20/2022   Procedure: Cervical four-five Cervical five-six Cervical six-seven Anterior Cervical Decompression Fusion;  Surgeon: Barnett Abu, MD;  Location: [facility];  Service: Neurosurgery;  Laterality: N/A;   ANTERIOR FUSION CERVICAL SPINE N/A 12/20/2022   BACK SURGERY  2008   l 3 to l4 l4 to l5   BARTHOLIN GLAND CYST EXCISION Right 09/09/2019   Procedure: EXCISION OF VULVAR MASS TIMES TWO;  Surgeon: Jerene Bears, MD;  Location: [facility];  Service: Gynecology;  Laterality: Right;   CARDIAC CATHETERIZATION  04/07/2007   Noncritical coronary artery disease. Contiue medical therapy.   CARDIAC CATHETERIZATION  12/03/2007   Normal LV function. Mild angiographic mitral valve prolapse. Normal coronary arteries.   CARDIOVASCULAR STRESS TEST  11/09/2007   Moderate ischemia in Mid Anterior, Mid Anteroseptal, Apical Anterior, and Apical Septal regions. EKG negative for ischemia.   CATARACT EXTRACTION Right 04/25/2023   CESAREAN SECTION  1985, 1989   COLON RESECTION Left 01/03/2022   Procedure: COLON RESECTION;  Surgeon: Quentin Ore, MD;  Location: [facility];  Service: General;  Laterality: Left;   COLOSTOMY  01/03/2022   Procedure: COLOSTOMY;  Surgeon: Quentin Ore, MD;  Location: [facility];  Service: General;;   dental implants  11/2002,11/2011,11/2012   dental implants     x 6   DENTAL SURGERY     DILATION AND CURETTAGE OF UTERUS     d and e 1988 and 1990   JOINT REPLACEMENT  09/21/2011   bilateral knee   KNEE ARTHROSCOPY     bilateral   LAPAROTOMY N/A 01/03/2022   Procedure: EXPLORATORY LAPAROTOMY;  Surgeon: Quentin Ore, MD;  Location: [facility];  Service: General;  Laterality: N/A;   lumbar surgery  fixation with disc replacement  09/2007   Left   NASAL SEPTOPLASTY W/ TURBINOPLASTY  05/2000   NOCTURNAL POLYSOMNOGRAM  12/31/2006   Severe obstructive sleep apnea. AHI-87/hr   REPLACEMENT TOTAL KNEE  2012   bilateral   TONSILLECTOMY  1952   adenoids too   TOTAL HIP ARTHROPLASTY Left 10/21/2013   Procedure: LEFT TOTAL HIP ARTHROPLASTY ANTERIOR APPROACH;  Surgeon: Loanne Drilling, MD;  Location:  [facility];  Service: Orthopedics;  Laterality: Left;   TOTAL HIP ARTHROPLASTY Right 07/16/2014   Procedure: RIGHT TOTAL HIP ARTHROPLASTY ANTERIOR APPROACH;  Surgeon: Loanne Drilling, MD;  Location: [facility];  Service: Orthopedics;  Laterality: Right;   TRANSTHORACIC ECHOCARDIOGRAM  11/09/2007   EF 58%, LV systolic function normal. Mild aortic root dilation.   WRIST SURGERY Left 04/03/2014   ORIF "distal radial head fracture"   XI ROBOTIC ASSISTED COLOSTOMY TAKEDOWN N/A 09/26/2022   Procedure: ROBOTIC OSTOMY REVERSAL;  Surgeon: Quentin Ore, MD;  Location: [facility];  Service: General;  Laterality: N/A;    Medications: Current Outpatient Medications  Medication Sig Dispense Refill   acetaminophen (TYLENOL) 500 MG tablet Take 1 tablet (500 mg total) by mouth every 6 (six) hours as needed for fever or mild pain. (Patient taking differently: Take 500 mg by mouth every morning.) 30 tablet 0   amLODipine (NORVASC) 5 MG tablet TAKE 1 TABLET BY MOUTH EVERY DAY 90 tablet 3   amoxicillin (AMOXIL) 500 MG capsule Take 2,000 mg by mouth See admin instructions. Take 2000 mg by mouth 1 hour prior to dental procedures.     aspirin EC 81 MG tablet Take 81 mg by mouth daily.     atorvastatin (LIPITOR) 20 MG tablet Take 1 tablet (20 mg total) by mouth daily. 90 tablet 3   Azelaic Acid 15 % cream Apply 1 application topically 2 (two) times daily as needed (rosacea). After skin is thoroughly washed and patted dry, gently but thoroughly massage a thin film of azelaic acid cream into the affected area t daily,after  shower.     calcium elemental as carbonate (TUMS ULTRA 1000) 400 MG chewable tablet Chew 1,000 mg by mouth daily as needed for heartburn.     carvedilol (COREG) 3.125 MG tablet TAKE 1 TABLET BY MOUTH 2 TIMES DAILY WITH A MEAL 180 tablet 3   celecoxib (CELEBREX) 200 MG capsule TAKE 1 CAPSULE BY MOUTH ONCE DAILY. MAY ALTERNATE WITH 100MG  90 capsule 0   DULoxetine (CYMBALTA) 60 MG capsule TAKE 1 CAPSULE BY MOUTH ONCE DAILY 90 capsule 0   fexofenadine (ALLEGRA) 180 MG tablet Take 180 mg by mouth daily with supper.     metroNIDAZOLE (METROGEL) 1 % gel Apply 1 Application topically daily as needed (rosacea).     Multiple Vitamin (MULTIVITAMIN WITH MINERALS) TABS tablet Take 1 tablet by mouth daily. Centrum Women's no Iron     nitroGLYCERIN (NITROSTAT) 0.4 MG SL tablet PLACE 1 TABLET UNDER THE TONGUE EVERY 5 MINUTES AS NEEDED FOR CHEST PAIN 25 tablet 0   omeprazole (PRILOSEC) 20 MG capsule Take 40 mg by mouth every morning.     psyllium (REGULOID) 0.52 g capsule Take 0.52 g by mouth 2 (two) times daily as needed (bowel).     ramipril (ALTACE) 10 MG capsule TAKE 1 CAPSULE BY MOUTH EVERY DAY 90 capsule 3   valACYclovir (VALTREX) 1000 MG tablet TAKE 2 TABLET BY MOUTH Q12 hours X 2 DOSES WITH FEVER BLISTERS 30 tablet 3   No current facility-administered medications for this visit.    Allergies  Allergen Reactions   Codeine Nausea Only   Crab [Shellfish Allergy] Itching and Nausea And Vomiting    Palm and sole of feet itch- stone crab  Per pt- betadine ok     Individualized Treatment Plan         Strengths: intelligent, very loving, ambitious, resourceful  Supports: spouse   Goal/Needs for Treatment:  In order of  importance to patient 1) Learn and implement strategies and coping skill to reduce depression 2) Adjusting to major life changes due to her health 3) Learn and implement strategies and coping skill to reduce anxiety   Client Statement of Needs:  Pt would like to feel happier, adjust  to the major changes she is experiencing due to her health, reduce anxiety   Treatment Level: Outpatient Individual Psychotherapy  Symptoms:   Client Treatment Preferences: Prefers in person   Healthcare consumer's goal for treatment:  Psychologist, Hilma Favors, Ph.D. will support the patient's ability to achieve the goals identified. Cognitive Behavioral Therapy, Dialectical Behavioral Therapy, Motivational Interviewing, Behavior Activation, and other evidenced-based practices will be used to promote progress towards healthy functioning.   Healthcare Terri Rodriguez consumer will: Actively participate in therapy, working towards healthy functioning.    *Justification for Continuation/Discontinuation of Goal: R=Revised, O=Ongoing, A=Achieved, D=Discontinued  Goal 1) Learn and implement strategies and coping skill to reduce depression  Likert rating baseline date : 10/05/2023 Target Date Goal Was reviewed Status Code Progress towards goal/Likert rating  10/04/2024                         O                        Goal 2) Adjusting to major life changes due to her health  Likert rating baseline date: 10/05/2023 Target Date Goal Was reviewed Status Code Progress towards goal/Likert rating  10/04/2024           O              Goal 3) Learn and implement strategies and coping skill to reduce anxiety  Likert rating baseline date: 10/05/2023 Target Date Goal Was reviewed Status Code Progress towards goal/Likert rating  10/04/2024           O                      This plan has been reviewed and created by the following participants:  This plan will be reviewed at least every 12 months. Date Behavioral Health Clinician Date Guardian/Patient   10/05/2023 Hilma Favors, Ph.D.  10/05/2023 Terri Rodriguez                   Diagnoses:   Major Depressive Disorder, recurrent, moderate Anxiety due to multiple medical problems  Terri Rodriguez states that she has an appointment with Dr. Roseanne Reno  set for an neuropsychological evaluation (11/15/23).  She reports that she is at the beach with her husband relaxing but will return to [facility] for the Thanksgiving holiday.  Terri Rodriguez shared a memory about learning to cook in [place].  I invited her to reminisce about this special time in her life. I was evident that she had a clear pice in her mind of what occurred, but at times struggled to retreive the names of things.  Similarly, I observed as she was sharing a story about her granddaughter, there was a similar pattern.  Terri Rodriguez says her "memory is bad" but it seems like it is more about memory retrieval.    Hilma Favors, PhD

## 2023-11-07 DIAGNOSIS — K080 Exfoliation of teeth due to systemic causes: Secondary | ICD-10-CM | POA: Diagnosis not present

## 2023-11-09 ENCOUNTER — Ambulatory Visit: Payer: Medicare Other | Admitting: Psychology

## 2023-11-09 DIAGNOSIS — F419 Anxiety disorder, unspecified: Secondary | ICD-10-CM

## 2023-11-09 DIAGNOSIS — F331 Major depressive disorder, recurrent, moderate: Secondary | ICD-10-CM

## 2023-11-09 NOTE — Progress Notes (Addendum)
PROGRESS NOTE:  Name: Terri Rodriguez Date: 11/09/2023 MRN: [id number] DOB: [birth year] PCP: Madelin Headings, MD  Time spent: 1:00 PM - 1:57 PM  Today I met in person with Terri Rodriguez for in office face-to-face individual psychotherapy.   Reason for Visit /Presenting Problem:  Terri Rodriguez is an 77 y.o. MWW who had a medical crisis and "hasn't been the same since."  In 2023, she went to see a G/I, was misdiagnosed, her diverticuli ruptured and she became septic.  The most distressing sequelae has been the changes to her cognitive health.  In particular, it seems she is having difficulties retrieving words she should know.  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  She was an award Research officer, trade union, a gifted provider and now she can't even find the words she needs.  Terri Rodriguez also states that she has had many loses in her life.  She's had two miscarriages.    Terri Rodriguez has osteoarthritis and experiences chronic pain.  Her husband is tired of her "groaning and moaning."  [name] isn't able to get around independently in most situations, she is aided by a cane and a scooter if at the grocery store.      Mental Status Exam: Appearance:   Casual     Behavior:  Appropriate  Motor:  Normal  Speech/Language:   Clear and Coherent  Affect:  Appropriate  Mood:  anxious and depressed  Thought process:  normal  Thought content:    WNL  Sensory/Perceptual disturbances:    WNL  Orientation:  oriented to person, place, time/date, and situation  Attention:  Good  Concentration:  Good  Memory:  Undetermined memory loss - word retrival  Fund of knowledge:   Good  Insight:    Good  Judgment:   Good  Impulse Control:  Good    Reported Symptoms: sleep disruption, appetite disruption,    Family History:  Family History  Problem Relation Age of Onset   Rheum arthritis Mother    Diabetes Mother    Heart failure Mother    Thrombocytopenia Mother    Sleep apnea Mother     Ulcers Mother        PUD   Deep vein thrombosis Father    Pulmonary embolism Father    Osteoarthritis Father    Atrial fibrillation Father    Dementia Father    Sleep apnea Father    Sleep apnea Brother    Obesity Brother    Sleep apnea Brother    Obesity Brother    Atrial fibrillation Brother    Colon cancer Maternal Aunt 59   Colon cancer Maternal Aunt 90   Congenital adrenal hyperplasia Grandchild    Pancreatic cancer Other    Uterine cancer Other    Leukemia Other    Inflammatory bowel disease Other        aunt   Esophageal cancer Neg Hx    Rectal cancer Neg Hx    Stomach cancer Neg Hx    Colon polyps Neg Hx      Medical History/Surgical History: reviewed Past Medical History:  Diagnosis Date   ALLERGIC RHINITIS    Allergy    Anginal pain (HCC)    Blood transfusion without reported diagnosis    Cataract    small right   Cholelithiasis 06/09/2008   gallstones 2009 on ct scan   Complication of anesthesia 2012   went to ICU after bilateral knees , unstable  vital signs oct 2012, has surgery since did ok   Coronary artery disease    30 % narrowing lad   Diverticulitis    EPICONDYLITIS, LATERAL    osteoarthritis, previous joint replacements   Fall 10/2018   GERD    Hepatic hemangioma 06/09/2008   Hiatal hernia 03/2000   Hx of adenomatous polyp of colon 06/05/2008   Hx of cardiac catheterization 2008   clean coronarys DrKelly    Hx of chest pain 2003   neg cath remot hx of narrowwing lad in 2003 nl 2008, none in many years   HYPERLIPIDEMIA    IBS (irritable bowel syndrome)    LEUKOPENIA, CHRONIC    Mononucleosis 1963   RAYNAUD'S SYNDROME, HX OF    improved after cardiac meds initiated   Rosacea    facial   Sleep apnea    SLEEP APNEA, OBSTRUCTIVE    cpap, settings "automatic" settings 11-12   Subacute thyroiditis    UNSPECIFIED ANEMIA     Past Surgical History:  Procedure Laterality Date   ABDOMINAL HYSTERECTOMY  1993   bso   ANTERIOR CERVICAL  DECOMP/DISCECTOMY FUSION N/A 12/20/2022   Procedure: Cervical four-five Cervical five-six Cervical six-seven Anterior Cervical Decompression Fusion;  Surgeon: Barnett Abu, MD;  Location: [facility];  Service: Neurosurgery;  Laterality: N/A;   ANTERIOR FUSION CERVICAL SPINE N/A 12/20/2022   BACK SURGERY  2008   l 3 to l4 l4 to l5   BARTHOLIN GLAND CYST EXCISION Right 09/09/2019   Procedure: EXCISION OF VULVAR MASS TIMES TWO;  Surgeon: Jerene Bears, MD;  Location: [facility];  Service: Gynecology;  Laterality: Right;   CARDIAC CATHETERIZATION  04/07/2007   Noncritical coronary artery disease. Contiue medical therapy.   CARDIAC CATHETERIZATION  12/03/2007   Normal LV function. Mild angiographic mitral valve prolapse. Normal coronary arteries.   CARDIOVASCULAR STRESS TEST  11/09/2007   Moderate ischemia in Mid Anterior, Mid Anteroseptal, Apical Anterior, and Apical Septal regions. EKG negative for ischemia.   CATARACT EXTRACTION Right 04/25/2023   CESAREAN SECTION  1985, 1989   COLON RESECTION Left 01/03/2022   Procedure: COLON RESECTION;  Surgeon: Quentin Ore, MD;  Location: [facility];  Service: General;  Laterality: Left;   COLOSTOMY  01/03/2022   Procedure: COLOSTOMY;  Surgeon: Quentin Ore, MD;  Location: [facility];  Service: General;;   dental implants  11/2002,11/2011,11/2012   dental implants     x 6   DENTAL SURGERY     DILATION AND CURETTAGE OF UTERUS     d and e 1988 and 1990   JOINT REPLACEMENT  09/21/2011   bilateral knee   KNEE ARTHROSCOPY     bilateral   LAPAROTOMY N/A 01/03/2022   Procedure: EXPLORATORY LAPAROTOMY;  Surgeon: Quentin Ore, MD;  Location: [facility];  Service: General;  Laterality: N/A;   lumbar surgery fixation with disc replacement  09/2007   Left   NASAL SEPTOPLASTY W/ TURBINOPLASTY  05/2000   NOCTURNAL POLYSOMNOGRAM  12/31/2006   Severe obstructive sleep apnea. AHI-87/hr   REPLACEMENT TOTAL KNEE  2012   bilateral    TONSILLECTOMY  1952   adenoids too   TOTAL HIP ARTHROPLASTY Left 10/21/2013   Procedure: LEFT TOTAL HIP ARTHROPLASTY ANTERIOR APPROACH;  Surgeon: Loanne Drilling, MD;  Location: [facility];  Service: Orthopedics;  Laterality: Left;   TOTAL HIP ARTHROPLASTY Right 07/16/2014   Procedure: RIGHT TOTAL HIP ARTHROPLASTY ANTERIOR APPROACH;  Surgeon: Loanne Drilling, MD;  Location: [facility];  Service: Orthopedics;  Laterality: Right;   TRANSTHORACIC ECHOCARDIOGRAM  11/09/2007   EF 58%, LV systolic function normal. Mild aortic root dilation.   WRIST SURGERY Left 04/03/2014   ORIF "distal radial head fracture"   XI ROBOTIC ASSISTED COLOSTOMY TAKEDOWN N/A 09/26/2022   Procedure: ROBOTIC OSTOMY REVERSAL;  Surgeon: Quentin Ore, MD;  Location: [facility];  Service: General;  Laterality: N/A;    Medications: Current Outpatient Medications  Medication Sig Dispense Refill   acetaminophen (TYLENOL) 500 MG tablet Take 1 tablet (500 mg total) by mouth every 6 (six) hours as needed for fever or mild pain. (Patient taking differently: Take 500 mg by mouth every morning.) 30 tablet 0   amLODipine (NORVASC) 5 MG tablet TAKE 1 TABLET BY MOUTH EVERY DAY 90 tablet 3   amoxicillin (AMOXIL) 500 MG capsule Take 2,000 mg by mouth See admin instructions. Take 2000 mg by mouth 1 hour prior to dental procedures.     aspirin EC 81 MG tablet Take 81 mg by mouth daily.     atorvastatin (LIPITOR) 20 MG tablet Take 1 tablet (20 mg total) by mouth daily. 90 tablet 3   Azelaic Acid 15 % cream Apply 1 application topically 2 (two) times daily as needed (rosacea). After skin is thoroughly washed and patted dry, gently but thoroughly massage a thin film of azelaic acid cream into the affected area t daily,after shower.     calcium elemental as carbonate (TUMS ULTRA 1000) 400 MG chewable tablet Chew 1,000 mg by mouth daily as needed for heartburn.     carvedilol (COREG) 3.125 MG tablet TAKE 1 TABLET BY MOUTH 2 TIMES DAILY WITH A MEAL 180  tablet 3   celecoxib (CELEBREX) 200 MG capsule TAKE 1 CAPSULE BY MOUTH ONCE DAILY. MAY ALTERNATE WITH 100MG  90 capsule 0   DULoxetine (CYMBALTA) 60 MG capsule TAKE 1 CAPSULE BY MOUTH ONCE DAILY 90 capsule 0   fexofenadine (ALLEGRA) 180 MG tablet Take 180 mg by mouth daily with supper.     metroNIDAZOLE (METROGEL) 1 % gel Apply 1 Application topically daily as needed (rosacea).     Multiple Vitamin (MULTIVITAMIN WITH MINERALS) TABS tablet Take 1 tablet by mouth daily. Centrum Women's no Iron     nitroGLYCERIN (NITROSTAT) 0.4 MG SL tablet PLACE 1 TABLET UNDER THE TONGUE EVERY 5 MINUTES AS NEEDED FOR CHEST PAIN 25 tablet 0   omeprazole (PRILOSEC) 20 MG capsule Take 40 mg by mouth every morning.     psyllium (REGULOID) 0.52 g capsule Take 0.52 g by mouth 2 (two) times daily as needed (bowel).     ramipril (ALTACE) 10 MG capsule TAKE 1 CAPSULE BY MOUTH EVERY DAY 90 capsule 3   valACYclovir (VALTREX) 1000 MG tablet TAKE 2 TABLET BY MOUTH Q12 hours X 2 DOSES WITH FEVER BLISTERS 30 tablet 3   No current facility-administered medications for this visit.    Allergies  Allergen Reactions   Codeine Nausea Only   Crab [Shellfish Allergy] Itching and Nausea And Vomiting    Palm and sole of feet itch- stone crab  Per pt- betadine ok     Individualized Treatment Plan         Strengths: intelligent, very loving, ambitious, resourceful  Supports: spouse   Goal/Needs for Treatment:  In order of importance to patient 1) Learn and implement strategies and coping skill to reduce depression 2) Adjusting to major life changes due to her health 3) Learn and implement strategies and coping skill to  reduce anxiety   Client Statement of Needs:  Pt would like to feel happier, adjust to the major changes she is experiencing due to her health, reduce anxiety   Treatment Level: Outpatient Individual Psychotherapy  Symptoms:   Client Treatment Preferences: Prefers in person   Healthcare consumer's goal for  treatment:  Psychologist, Hilma Favors, Ph.D. will support the patient's ability to achieve the goals identified. Cognitive Behavioral Therapy, Dialectical Behavioral Therapy, Motivational Interviewing, Behavior Activation, and other evidenced-based practices will be used to promote progress towards healthy functioning.   Healthcare Terri Rodriguez consumer will: Actively participate in therapy, working towards healthy functioning.    *Justification for Continuation/Discontinuation of Goal: R=Revised, O=Ongoing, A=Achieved, D=Discontinued  Goal 1) Learn and implement strategies and coping skills to reduce depression  Likert rating baseline date : 10/05/2023 Target Date Goal Was reviewed Status Code Progress towards goal/Likert rating  10/04/2024                         O                        Goal 2) Adjusting to major life changes due to her health  Likert rating baseline date: 10/05/2023 Target Date Goal Was reviewed Status Code Progress towards goal/Likert rating  10/04/2024           O              Goal 3) Learn and implement strategies and coping skills to reduce anxiety  Likert rating baseline date: 10/05/2023 Target Date Goal Was reviewed Status Code Progress towards goal/Likert rating  10/04/2024           O                      This plan has been reviewed and created by the following participants:  This plan will be reviewed at least every 12 months. Date Behavioral Health Clinician Date Guardian/Patient   10/05/2023 Hilma Favors, Ph.D.  10/05/2023 Terri Rodriguez                   Diagnoses:   Major Depressive Disorder, recurrent, moderate Anxiety due to multiple medical problems  Rogena reports that she has been busy with the family her daughter is helping to get on their feet.  In session, she continues to be self-deprecating when she struggles to find a word and was unsure how to answer when asked, "how are you doing."  I provided guidance on how to best  manage this frequently asked question w/o getting tangled up in what she can't remember.  I encouraged her to begin to log and assess how she is doing.  She elected to use a 5 point Likert scale on which to rate "how she is doing," in order to ascertain if her functioning is better at particular times during the day.  Having this information will allow her to tailor her activities to times when they are best suited.  I also noted the benefit of having this information to share when she meets with Dr. Lu Duffel.     Hilma Favors, PhD

## 2023-11-15 ENCOUNTER — Ambulatory Visit: Payer: Medicare Other | Admitting: Psychology

## 2023-11-15 DIAGNOSIS — G3184 Mild cognitive impairment, so stated: Secondary | ICD-10-CM | POA: Diagnosis not present

## 2023-11-23 ENCOUNTER — Ambulatory Visit: Payer: Medicare Other | Admitting: Psychology

## 2023-11-23 DIAGNOSIS — F331 Major depressive disorder, recurrent, moderate: Secondary | ICD-10-CM | POA: Diagnosis not present

## 2023-11-23 DIAGNOSIS — F419 Anxiety disorder, unspecified: Secondary | ICD-10-CM

## 2023-11-23 NOTE — Progress Notes (Signed)
PROGRESS NOTE:  Name: Terri Rodriguez Date: 11/23/2023 MRN: [id number] DOB: Mar 26, 1946 PCP: Madelin Headings, MD  Time spent: 12:00 PM - 12:57 PM  Today I met in person with [name] for in office face-to-face individual psychotherapy.   Reason for Visit /Presenting Problem:  Terri Rodriguez is an 77 y.o. MWW who had a medical crisis and "hasn't been the same since."  In 2023, she went to see a G/I, was misdiagnosed, her diverticuli ruptured and she became septic.  The most distressing sequelae has been the changes to her cognitive health.  In particular, it seems she is having difficulties retrieving words she should know.  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  She was an award Research officer, trade union, a gifted provider and now she can't even find the words she needs.  Raylinn also states that she has had many loses in her life.  She's had two miscarriages.    Cossette has osteoarthritis and experiences chronic pain.  Her husband is tired of her "groaning and moaning."  Latoia isn't able to get around independently in most situations, she is aided by a cane and a scooter if at the grocery store.      Mental Status Exam: Appearance:   Casual     Behavior:  Appropriate  Motor:  Normal  Speech/Language:   Clear and Coherent  Affect:  Appropriate  Mood:  anxious and depressed  Thought process:  normal  Thought content:    WNL  Sensory/Perceptual disturbances:    WNL  Orientation:  oriented to person, place, time/date, and situation  Attention:  Good  Concentration:  Good  Memory:  Undetermined memory loss - word retrival  Fund of knowledge:   Good  Insight:    Good  Judgment:   Good  Impulse Control:  Good    Reported Symptoms: sleep disruption, appetite disruption,    Family History:  Family History  Problem Relation Age of Onset   Rheum arthritis Mother    Diabetes Mother    Heart failure Mother    Thrombocytopenia Mother    Sleep apnea Mother     Ulcers Mother        PUD   Deep vein thrombosis Father    Pulmonary embolism Father    Osteoarthritis Father    Atrial fibrillation Father    Dementia Father    Sleep apnea Father    Sleep apnea Brother    Obesity Brother    Sleep apnea Brother    Obesity Brother    Atrial fibrillation Brother    Colon cancer Maternal Aunt 59   Colon cancer Maternal Aunt 90   Congenital adrenal hyperplasia Grandchild    Pancreatic cancer Other    Uterine cancer Other    Leukemia Other    Inflammatory bowel disease Other        aunt   Esophageal cancer Neg Hx    Rectal cancer Neg Hx    Stomach cancer Neg Hx    Colon polyps Neg Hx      Medical History/Surgical History: reviewed Past Medical History:  Diagnosis Date   ALLERGIC RHINITIS    Allergy    Anginal pain (HCC)    Blood transfusion without reported diagnosis    Cataract    small right   Cholelithiasis 06/09/2008   gallstones 2009 on ct scan   Complication of anesthesia 2012   went to ICU after bilateral  knees , unstable vital signs oct 2012, has surgery since did ok   Coronary artery disease    30 % narrowing lad   Diverticulitis    EPICONDYLITIS, LATERAL    osteoarthritis, previous joint replacements   Fall 10/2018   GERD    Hepatic hemangioma 06/09/2008   Hiatal hernia 03/2000   Hx of adenomatous polyp of colon 06/05/2008   Hx of cardiac catheterization 2008   clean coronarys DrKelly    Hx of chest pain 2003   neg cath remot hx of narrowwing lad in 2003 nl 2008, none in many years   HYPERLIPIDEMIA    IBS (irritable bowel syndrome)    LEUKOPENIA, CHRONIC    Mononucleosis 1963   RAYNAUD'S SYNDROME, HX OF    improved after cardiac meds initiated   Rosacea    facial   Sleep apnea    SLEEP APNEA, OBSTRUCTIVE    cpap, settings "automatic" settings 11-12   Subacute thyroiditis    UNSPECIFIED ANEMIA     Past Surgical History:  Procedure Laterality Date   ABDOMINAL HYSTERECTOMY  1993   bso   ANTERIOR CERVICAL  DECOMP/DISCECTOMY FUSION N/A 12/20/2022   Procedure: Cervical four-five Cervical five-six Cervical six-seven Anterior Cervical Decompression Fusion;  Surgeon: Barnett Abu, MD;  Location: [facility];  Service: Neurosurgery;  Laterality: N/A;   ANTERIOR FUSION CERVICAL SPINE N/A 12/20/2022   BACK SURGERY  2008   l 3 to l4 l4 to l5   BARTHOLIN GLAND CYST EXCISION Right 09/09/2019   Procedure: EXCISION OF VULVAR MASS TIMES TWO;  Surgeon: Jerene Bears, MD;  Location: [facility];  Service: Gynecology;  Laterality: Right;   CARDIAC CATHETERIZATION  04/07/2007   Noncritical coronary artery disease. Contiue medical therapy.   CARDIAC CATHETERIZATION  12/03/2007   Normal LV function. Mild angiographic mitral valve prolapse. Normal coronary arteries.   CARDIOVASCULAR STRESS TEST  11/09/2007   Moderate ischemia in Mid Anterior, Mid Anteroseptal, Apical Anterior, and Apical Septal regions. EKG negative for ischemia.   CATARACT EXTRACTION Right 04/25/2023   CESAREAN SECTION  1985, 1989   COLON RESECTION Left 01/03/2022   Procedure: COLON RESECTION;  Surgeon: Quentin Ore, MD;  Location: [facility];  Service: General;  Laterality: Left;   COLOSTOMY  01/03/2022   Procedure: COLOSTOMY;  Surgeon: Quentin Ore, MD;  Location: [facility];  Service: General;;   dental implants  11/2002,11/2011,11/2012   dental implants     x 6   DENTAL SURGERY     DILATION AND CURETTAGE OF UTERUS     d and e 1988 and 1990   JOINT REPLACEMENT  09/21/2011   bilateral knee   KNEE ARTHROSCOPY     bilateral   LAPAROTOMY N/A 01/03/2022   Procedure: EXPLORATORY LAPAROTOMY;  Surgeon: Quentin Ore, MD;  Location: [facility];  Service: General;  Laterality: N/A;   lumbar surgery fixation with disc replacement  09/2007   Left   NASAL SEPTOPLASTY W/ TURBINOPLASTY  05/2000   NOCTURNAL POLYSOMNOGRAM  12/31/2006   Severe obstructive sleep apnea. AHI-87/hr   REPLACEMENT TOTAL KNEE  2012   bilateral    TONSILLECTOMY  1952   adenoids too   TOTAL HIP ARTHROPLASTY Left 10/21/2013   Procedure: LEFT TOTAL HIP ARTHROPLASTY ANTERIOR APPROACH;  Surgeon: Loanne Drilling, MD;  Location: [facility];  Service: Orthopedics;  Laterality: Left;   TOTAL HIP ARTHROPLASTY Right 07/16/2014   Procedure: RIGHT TOTAL HIP ARTHROPLASTY ANTERIOR APPROACH;  Surgeon: Loanne Drilling, MD;  Location: [facility];  Service: Orthopedics;  Laterality: Right;   TRANSTHORACIC ECHOCARDIOGRAM  11/09/2007   EF 58%, LV systolic function normal. Mild aortic root dilation.   WRIST SURGERY Left 04/03/2014   ORIF "distal radial head fracture"   XI ROBOTIC ASSISTED COLOSTOMY TAKEDOWN N/A 09/26/2022   Procedure: ROBOTIC OSTOMY REVERSAL;  Surgeon: Quentin Ore, MD;  Location: [facility];  Service: General;  Laterality: N/A;    Medications: Current Outpatient Medications  Medication Sig Dispense Refill   acetaminophen (TYLENOL) 500 MG tablet Take 1 tablet (500 mg total) by mouth every 6 (six) hours as needed for fever or mild pain. (Patient taking differently: Take 500 mg by mouth every morning.) 30 tablet 0   amLODipine (NORVASC) 5 MG tablet TAKE 1 TABLET BY MOUTH EVERY DAY 90 tablet 3   amoxicillin (AMOXIL) 500 MG capsule Take 2,000 mg by mouth See admin instructions. Take 2000 mg by mouth 1 hour prior to dental procedures.     aspirin EC 81 MG tablet Take 81 mg by mouth daily.     atorvastatin (LIPITOR) 20 MG tablet Take 1 tablet (20 mg total) by mouth daily. 90 tablet 3   Azelaic Acid 15 % cream Apply 1 application topically 2 (two) times daily as needed (rosacea). After skin is thoroughly washed and patted dry, gently but thoroughly massage a thin film of azelaic acid cream into the affected area t daily,after shower.     calcium elemental as carbonate (TUMS ULTRA 1000) 400 MG chewable tablet Chew 1,000 mg by mouth daily as needed for heartburn.     carvedilol (COREG) 3.125 MG tablet TAKE 1 TABLET BY MOUTH 2 TIMES DAILY WITH A MEAL 180  tablet 3   celecoxib (CELEBREX) 200 MG capsule TAKE 1 CAPSULE BY MOUTH ONCE DAILY. MAY ALTERNATE WITH 100MG  90 capsule 0   DULoxetine (CYMBALTA) 60 MG capsule TAKE 1 CAPSULE BY MOUTH ONCE DAILY 90 capsule 0   fexofenadine (ALLEGRA) 180 MG tablet Take 180 mg by mouth daily with supper.     metroNIDAZOLE (METROGEL) 1 % gel Apply 1 Application topically daily as needed (rosacea).     Multiple Vitamin (MULTIVITAMIN WITH MINERALS) TABS tablet Take 1 tablet by mouth daily. Centrum Women's no Iron     nitroGLYCERIN (NITROSTAT) 0.4 MG SL tablet PLACE 1 TABLET UNDER THE TONGUE EVERY 5 MINUTES AS NEEDED FOR CHEST PAIN 25 tablet 0   omeprazole (PRILOSEC) 20 MG capsule Take 40 mg by mouth every morning.     psyllium (REGULOID) 0.52 g capsule Take 0.52 g by mouth 2 (two) times daily as needed (bowel).     ramipril (ALTACE) 10 MG capsule TAKE 1 CAPSULE BY MOUTH EVERY DAY 90 capsule 3   valACYclovir (VALTREX) 1000 MG tablet TAKE 2 TABLET BY MOUTH Q12 hours X 2 DOSES WITH FEVER BLISTERS 30 tablet 3   No current facility-administered medications for this visit.    Allergies  Allergen Reactions   Codeine Nausea Only   Crab [Shellfish Allergy] Itching and Nausea And Vomiting    Palm and sole of feet itch- stone crab  Per pt- betadine ok     Individualized Treatment Plan         Strengths: intelligent, very loving, ambitious, resourceful  Supports: spouse   Goal/Needs for Treatment:  In order of importance to patient 1) Learn and implement strategies and coping skill to reduce depression 2) Adjusting to major life changes due to her health 3) Learn and implement strategies and coping  skill to reduce anxiety   Client Statement of Needs:  Pt would like to feel happier, adjust to the major changes she is experiencing due to her health, reduce anxiety   Treatment Level: Outpatient Individual Psychotherapy  Symptoms:   Client Treatment Preferences: Prefers in person   Healthcare consumer's goal for  treatment:  Psychologist, Hilma Favors, Ph.D. will support the patient's ability to achieve the goals identified. Cognitive Behavioral Therapy, Dialectical Behavioral Therapy, Motivational Interviewing, Behavior Activation, and other evidenced-based practices will be used to promote progress towards healthy functioning.   Healthcare [name] consumer will: Actively participate in therapy, working towards healthy functioning.    *Justification for Continuation/Discontinuation of Goal: R=Revised, O=Ongoing, A=Achieved, D=Discontinued  Goal 1) Learn and implement strategies and coping skills to reduce depression  Likert rating baseline date : 10/05/2023 Target Date Goal Was reviewed Status Code Progress towards goal/Likert rating  10/04/2024                         O                        Goal 2) Adjusting to major life changes due to her health  Likert rating baseline date: 10/05/2023 Target Date Goal Was reviewed Status Code Progress towards goal/Likert rating  10/04/2024           O              Goal 3) Learn and implement strategies and coping skills to reduce anxiety  Likert rating baseline date: 10/05/2023 Target Date Goal Was reviewed Status Code Progress towards goal/Likert rating  10/04/2024           O                      This plan has been reviewed and created by the following participants:  This plan will be reviewed at least every 12 months. Date Behavioral Health Clinician Date Guardian/Patient   10/05/2023 Hilma Favors, Ph.D.  10/05/2023 [name]                   Diagnoses:   Major Depressive Disorder, recurrent, moderate Anxiety due to multiple medical problems  Estefanie reports that she met with Dr. Lu Duffel and is scheduled for their feedback session in January.  We d/p what the experience was like and how she responded to the assessment exercises.  We d/e/p her family Christmas traditions, what she enjoys the most, and what she misses.  It was  helpful to get her [place] talking about how to manage "well intended" family and friends, who interrupt her thinking process and make her feel worse.     Hilma Favors, PhD

## 2023-11-27 ENCOUNTER — Ambulatory Visit: Payer: Medicare Other

## 2023-11-27 DIAGNOSIS — G8929 Other chronic pain: Secondary | ICD-10-CM | POA: Diagnosis not present

## 2023-11-27 NOTE — Progress Notes (Signed)
   11/27/2023  Patient ID: Terri Rodriguez, female   DOB: May 23, 1946, 77 y.o.   MRN: [id number]  Met with patient in office to review results of [employer] test results, ordered by PCP last month.  Provided a print out of the results. Pending being scanned into chart.   Based on patient's results, omeprazole may be a less effective choice to treat ingestion/acid reflux issues and patient will switch to nexium (she purchases OTC).   No other medications that the patient is currently taking are impacted by the results.  [employer] panel tested for the patient's metabolic rate for the following and patient's metabolism rates:  CYP2C19: Rapid Metabolizer CYP2D6: Intermediate Metabolizer CYP3A5: Poor Metabolizer VKORC1: Low warfarin sensitivity  CYP2B6: Normal CYB2C9: Normal CYP3A4: Normal DPYD: Normal Factor II: Normal Factor V Leiden: Normal SLCO1B1: Normal  Results also include a card with summary listed above. Instructed patient to bring this to all of her doctor's appts and mention at each visit, as this may impact best medication therapy choices moving forward. Patient expressed understanding and will reach out if any further questions/concerns.  Follow Up: 3 weeks   Sherrill Raring, PharmD Clinical Pharmacist [phone]

## 2023-12-01 ENCOUNTER — Other Ambulatory Visit: Payer: Self-pay | Admitting: Family

## 2023-12-14 ENCOUNTER — Encounter: Payer: Self-pay | Admitting: Internal Medicine

## 2023-12-14 ENCOUNTER — Other Ambulatory Visit: Payer: Self-pay | Admitting: Family

## 2023-12-14 ENCOUNTER — Other Ambulatory Visit: Payer: Self-pay | Admitting: Cardiovascular Disease

## 2023-12-18 ENCOUNTER — Other Ambulatory Visit (INDEPENDENT_AMBULATORY_CARE_PROVIDER_SITE_OTHER): Payer: Self-pay

## 2023-12-18 DIAGNOSIS — Z79899 Other long term (current) drug therapy: Secondary | ICD-10-CM

## 2023-12-18 NOTE — Progress Notes (Signed)
   12/18/2023  Patient ID: Cy KANDICE [place], female   DOB: October 17, 1946, 78 y.o.   MRN: [id number]  Contacted patient via telephone to follow up on her recent change from OTC Prilosec to OTC Nexium.  Patient made the change after her [employer] report showed that she may respond better to Nexium therapy.  Patient confirms she does feel her reflux issues are better controlled since switching to Nexium.  Is interested in discussing potential depression/psych meds with PCP now that she has this report. Will make PCP aware. Per [employer] report, medications listed below are metabolized normally by patient and would be good first selections at PCP discretion.     Jon VEAR Lindau, PharmD Clinical Pharmacist [phone]

## 2023-12-28 ENCOUNTER — Other Ambulatory Visit: Payer: Self-pay

## 2023-12-28 MED ORDER — CELECOXIB 200 MG PO CAPS: ORAL_CAPSULE | ORAL | 0 refills | Status: DC

## 2023-12-30 DIAGNOSIS — G4733 Obstructive sleep apnea (adult) (pediatric): Secondary | ICD-10-CM | POA: Diagnosis not present

## 2024-01-02 DIAGNOSIS — K080 Exfoliation of teeth due to systemic causes: Secondary | ICD-10-CM | POA: Diagnosis not present

## 2024-01-03 ENCOUNTER — Encounter: Payer: Self-pay | Admitting: Internal Medicine

## 2024-01-03 ENCOUNTER — Ambulatory Visit (INDEPENDENT_AMBULATORY_CARE_PROVIDER_SITE_OTHER): Payer: Medicare Other | Admitting: Internal Medicine

## 2024-01-03 VITALS — BP 100/56 | HR 76 | Temp 97.6°F | Ht 64.0 in | Wt 245.8 lb

## 2024-01-03 DIAGNOSIS — Z79899 Other long term (current) drug therapy: Secondary | ICD-10-CM

## 2024-01-03 DIAGNOSIS — E785 Hyperlipidemia, unspecified: Secondary | ICD-10-CM

## 2024-01-03 DIAGNOSIS — F32A Depression, unspecified: Secondary | ICD-10-CM

## 2024-01-03 DIAGNOSIS — F0670 Mild neurocognitive disorder due to known physiological condition without behavioral disturbance: Secondary | ICD-10-CM

## 2024-01-03 NOTE — Progress Notes (Signed)
Chief Complaint  Patient presents with   Medical Management of Chronic Issues    Pt is here to follow up on genetic test lab.     HPI: Terri Rodriguez 78 y.o. come in for Chronic disease management here with spouse   Had [employer]ers testinmg    for pain and psychoactive meds  See report  Had neuro psych eval in interim   showing preserved high level of ability executive function  but depression ,interfering word generation  very low. And dec work recall.  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 PT options  Takig celebrex daily  ROS: See pertinent positives and negatives per HPI.  Past Medical History:  Diagnosis Date   ALLERGIC RHINITIS    Allergy    Anginal pain (HCC)    Blood transfusion without reported diagnosis    Cataract    small right   Cholelithiasis 06/09/2008   gallstones 2009 on ct scan   Complication of anesthesia 2012   went to ICU after bilateral knees , unstable vital signs oct 2012, has surgery since did ok   Coronary artery disease    30 % narrowing lad   Diverticulitis    EPICONDYLITIS, LATERAL    osteoarthritis, previous joint replacements   Fall 10/2018   GERD    Hepatic hemangioma 06/09/2008   Hiatal hernia 03/2000   Hx of adenomatous polyp of colon 06/05/2008   Hx of cardiac catheterization 2008   clean coronarys DrKelly    Hx of chest pain 2003   neg cath remot hx of narrowwing lad in 2003 nl 2008, none in many years   HYPERLIPIDEMIA    IBS (irritable bowel syndrome)    LEUKOPENIA, CHRONIC    Mononucleosis 1963   RAYNAUD'S SYNDROME, HX OF    improved after cardiac meds initiated   Rosacea    facial   Sleep apnea    SLEEP APNEA, OBSTRUCTIVE    cpap, settings "automatic" settings 11-12   Subacute thyroiditis    UNSPECIFIED ANEMIA     Family History  Problem Relation Age of Onset   Rheum arthritis Mother    Diabetes Mother    Heart failure Mother     Thrombocytopenia Mother    Sleep apnea Mother    Ulcers Mother        PUD   Deep vein thrombosis Father    Pulmonary embolism Father    Osteoarthritis Father    Atrial fibrillation Father    Dementia Father    Sleep apnea Father    Sleep apnea Brother    Obesity Brother    Sleep apnea Brother    Obesity Brother    Atrial fibrillation Brother    Colon cancer Maternal Aunt 59   Colon cancer Maternal Aunt 90   Congenital adrenal hyperplasia Grandchild    Pancreatic cancer Other    Uterine cancer Other    Leukemia Other    Inflammatory bowel disease Other        aunt   Esophageal cancer Neg Hx    Rectal cancer Neg Hx    Stomach cancer Neg Hx    Colon polyps Neg Hx     Social History   Socioeconomic History   Marital status: Married    Spouse name: Not on file   Number of children: 1   Years of education: Not on file   Highest education level: Professional  school degree (e.g., MD, DDS, DVM, JD)  Occupational History   Occupation: Physician  Tobacco Use   Smoking status: Never   Smokeless tobacco: Never  Vaping Use   Vaping status: Never Used  Substance and Sexual Activity   Alcohol use: Yes    Alcohol/week: 7.0 standard drinks of alcohol    Types: 7 Glasses of wine per week   Drug use: No   Sexual activity: Yes    Partners: Male    Birth control/protection: Surgical    Comment: TAH/BSO  Other Topics Concern   Not on file  Social History Narrative   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   Social Drivers of Corporate investment banker Strain: Low Risk  (11/26/2023)   Overall Financial Resource Strain (CARDIA)    Difficulty of Paying Living Expenses: Not hard at all  Food Insecurity: No Food Insecurity (11/26/2023)   Hunger Vital Sign    Worried About Running Out of Food in the Last Year: Never true    Ran Out of Food in the Last Year: Never true  Transportation Needs: No  Transportation Needs (11/26/2023)   PRAPARE - Administrator, Civil Service (Medical): No    Lack of Transportation (Non-Medical): No  Physical Activity: Inactive (11/26/2023)   Exercise Vital Sign    Days of Exercise per Week: 0 days    Minutes of Exercise per Session: 0 min  Stress: No Stress Concern Present (11/26/2023)   [employer] of Occupational Health - Occupational Stress Questionnaire    Feeling of Stress : Not at all  Social Connections: Socially Integrated (11/26/2023)   Social Connection and Isolation Panel [NHANES]    Frequency of Communication with Friends and Family: Twice a week    Frequency of Social Gatherings with Friends and Family: Twice a week    Attends Religious Services: More than 4 times per year    Active Member of [employer] or Organizations: Yes    Attends Banker Meetings: Never    Marital Status: Married    Outpatient Medications Prior to Visit  Medication Sig Dispense Refill   acetaminophen (TYLENOL) 500 MG tablet Take 1 tablet (500 mg total) by mouth every 6 (six) hours as needed for fever or mild pain. (Patient taking differently: Take 500 mg by mouth every morning.) 30 tablet 0   amLODipine (NORVASC) 5 MG tablet TAKE 1 TABLET BY MOUTH EVERY DAY 90 tablet 3   aspirin EC 81 MG tablet Take 81 mg by mouth daily.     atorvastatin (LIPITOR) 20 MG tablet Take 1 tablet (20 mg total) by mouth daily. 90 tablet 3   Azelaic Acid 15 % cream Apply 1 application topically 2 (two) times daily as needed (rosacea). After skin is thoroughly washed and patted dry, gently but thoroughly massage a thin film of azelaic acid cream into the affected area t daily,after shower.     calcium elemental as carbonate (TUMS ULTRA 1000) 400 MG chewable tablet Chew 1,000 mg by mouth daily as needed for heartburn.     carvedilol (COREG) 3.125 MG tablet TAKE 1 TABLET BY MOUTH 2 TIMES DAILY WITH A MEAL 180 tablet 3   DULoxetine (CYMBALTA) 60 MG capsule TAKE 1  CAPSULE BY MOUTH ONCE DAILY 90 capsule 0   esomeprazole (NEXIUM) 20 MG capsule Take 20 mg by mouth daily at 12 noon.  fexofenadine (ALLEGRA) 180 MG tablet Take 180 mg by mouth daily with supper.     metroNIDAZOLE (METROGEL) 1 % gel Apply 1 Application topically daily as needed (rosacea).     Multiple Vitamin (MULTIVITAMIN WITH MINERALS) TABS tablet Take 1 tablet by mouth daily. Centrum Women's no Iron     nitroGLYCERIN (NITROSTAT) 0.4 MG SL tablet PLACE 1 TABLET UNDER THE TONGUE EVERY 5 MINUTES AS NEEDED FOR CHEST PAIN 25 tablet 0   psyllium (REGULOID) 0.52 g capsule Take 0.52 g by mouth 2 (two) times daily as needed (bowel).     ramipril (ALTACE) 10 MG capsule TAKE 1 CAPSULE BY MOUTH EVERY DAY 90 capsule 1   valACYclovir (VALTREX) 1000 MG tablet TAKE 2 TABLET BY MOUTH Q12 hours X 2 DOSES WITH FEVER BLISTERS 30 tablet 3   celecoxib (CELEBREX) 200 MG capsule TAKE 1 CAPSULE BY MOUTH ONCE DAILY. MAY ALTERNATE WITH 100MG  90 capsule 0   amoxicillin (AMOXIL) 500 MG capsule Take 2,000 mg by mouth See admin instructions. Take 2000 mg by mouth 1 hour prior to dental procedures. (Patient not taking: Reported on 01/03/2024)     No facility-administered medications prior to visit.     EXAM:  BP (!) 100/56 (BP Location: Left Arm, Patient Position: Sitting, Cuff Size: Large)   Pulse 76   Temp 97.6 F (36.4 C) (Oral)   Ht 5\' 4"  (1.626 m)   Wt 245 lb 12.8 oz (111.5 kg)   SpO2 98%   BMI 42.19 kg/m   Body mass index is 42.19 kg/m.  GENERAL: vitals reviewed and listed above, alert, oriented, appears well hydrated and in no acute distress walk with stiff painful gait   HEENT: atraumatic, conjunctiva  clear, no obvious abnormalities on inspection of external nose and ears NECK: no obvious masses on inspection palpation  LUNGS: clear to auscultation bilaterally, no wheezes, rales or rhonchi, good air movement CV: HRRR, no clubbing cyanosis or  peripheral edema nl cap refill  MS: moves all extremities  without noticeable focal  abnormality PSYCH: pleasant and cooperative verbal  some hesitation at word finding  Lab Results  Component Value Date   WBC 3.0 (L) 01/05/2024   HGB 12.7 01/05/2024   HCT 37.2 01/05/2024   PLT 218.0 01/05/2024   GLUCOSE 105 (H) 01/05/2024   CHOL 146 01/05/2024   TRIG 80.0 01/05/2024   HDL 77.00 01/05/2024   LDLCALC 53 01/05/2024   ALT 29 01/05/2024   AST 40 (H) 01/05/2024   NA 142 01/05/2024   K 4.1 01/05/2024   CL 105 01/05/2024   CREATININE 0.72 01/05/2024   BUN 21 01/05/2024   CO2 29 01/05/2024   TSH 1.33 01/05/2024   INR 1.0 04/13/2021   HGBA1C 5.6 01/05/2024   BP Readings from Last 3 Encounters:  01/03/24 (!) 100/56  10/03/23 100/60  06/01/23 106/66   Reviewed neuropsych eval  to scan   ASSESSMENT AND PLAN:  Discussed the following assessment and plan:  Medication management  Depression, unspecified depression type  Mild neurocognitive disorder due to multiple etiologies - eval per dr Roseanne Reno neuropsych done 12 24  Elevated lipids - Plan: Lipid panel Gene testing   pristiq ,prozac sertraline and effexor is on nl metabolism list  as well as wellbutrin Depending on cost would consider chang to  pristiq  and or add Wellbutrin  Consideration of mri of brain although non focal no new findings .  Disc add resistance exercise. -Patient advised to return or notify health care team  if  new concerns arise.  Patient Instructions   Add program for   muscle exercising .  Updating counselor.  Consider changing  antidepressant .  To pristiq  or Effexor xr .  Not sure about cymbalta  but will have angela help with seeing if pristiq  covered and then make plan for   fu .  Make lab appt  future . Add on lipid panel.  Fu depending  on when we decide to change  med. Neta Mends. Makenzy Krist M.D.

## 2024-01-03 NOTE — Patient Instructions (Addendum)
  Add program for   muscle exercising .  Updating counselor.  Consider changing  antidepressant .  To pristiq  or Effexor xr .  Not sure about cymbalta  but will have angela help with seeing if pristiq  covered and then make plan for   fu .  Make lab appt  future . Add on lipid panel.  Fu depending  on when we decide to change  med.

## 2024-01-05 ENCOUNTER — Other Ambulatory Visit (INDEPENDENT_AMBULATORY_CARE_PROVIDER_SITE_OTHER): Payer: Medicare Other

## 2024-01-05 ENCOUNTER — Encounter: Payer: Self-pay | Admitting: Internal Medicine

## 2024-01-05 ENCOUNTER — Other Ambulatory Visit: Payer: Self-pay | Admitting: Internal Medicine

## 2024-01-05 DIAGNOSIS — Z79899 Other long term (current) drug therapy: Secondary | ICD-10-CM | POA: Diagnosis not present

## 2024-01-05 DIAGNOSIS — R739 Hyperglycemia, unspecified: Secondary | ICD-10-CM

## 2024-01-05 DIAGNOSIS — E785 Hyperlipidemia, unspecified: Secondary | ICD-10-CM | POA: Diagnosis not present

## 2024-01-05 DIAGNOSIS — R413 Other amnesia: Secondary | ICD-10-CM

## 2024-01-05 DIAGNOSIS — D72819 Decreased white blood cell count, unspecified: Secondary | ICD-10-CM

## 2024-01-05 DIAGNOSIS — M159 Polyosteoarthritis, unspecified: Secondary | ICD-10-CM | POA: Diagnosis not present

## 2024-01-05 DIAGNOSIS — G8929 Other chronic pain: Secondary | ICD-10-CM

## 2024-01-05 LAB — HEPATIC FUNCTION PANEL
ALT: 29 U/L (ref 0–35)
AST: 40 U/L — ABNORMAL HIGH (ref 0–37)
Albumin: 4.3 g/dL (ref 3.5–5.2)
Alkaline Phosphatase: 85 U/L (ref 39–117)
Bilirubin, Direct: 0.1 mg/dL (ref 0.0–0.3)
Total Bilirubin: 0.5 mg/dL (ref 0.2–1.2)
Total Protein: 6.5 g/dL (ref 6.0–8.3)

## 2024-01-05 LAB — TSH: TSH: 1.33 u[IU]/mL (ref 0.35–5.50)

## 2024-01-05 LAB — CBC WITH DIFFERENTIAL/PLATELET
Basophils Absolute: 0 10*3/uL (ref 0.0–0.1)
Basophils Relative: 1 % (ref 0.0–3.0)
Eosinophils Absolute: 0.1 10*3/uL (ref 0.0–0.7)
Eosinophils Relative: 4.7 % (ref 0.0–5.0)
HCT: 37.2 % (ref 36.0–46.0)
Hemoglobin: 12.7 g/dL (ref 12.0–15.0)
Lymphocytes Relative: 42.2 % (ref 12.0–46.0)
Lymphs Abs: 1.3 10*3/uL (ref 0.7–4.0)
MCHC: 34.2 g/dL (ref 30.0–36.0)
MCV: 96.4 fL (ref 78.0–100.0)
Monocytes Absolute: 0.2 10*3/uL (ref 0.1–1.0)
Monocytes Relative: 8.2 % (ref 3.0–12.0)
Neutro Abs: 1.3 10*3/uL — ABNORMAL LOW (ref 1.4–7.7)
Neutrophils Relative %: 43.9 % (ref 43.0–77.0)
Platelets: 218 10*3/uL (ref 150.0–400.0)
RBC: 3.85 Mil/uL — ABNORMAL LOW (ref 3.87–5.11)
RDW: 14 % (ref 11.5–15.5)
WBC: 3 10*3/uL — ABNORMAL LOW (ref 4.0–10.5)

## 2024-01-05 LAB — T4, FREE: Free T4: 0.84 ng/dL (ref 0.60–1.60)

## 2024-01-05 LAB — HEMOGLOBIN A1C: Hgb A1c MFr Bld: 5.6 % (ref 4.6–6.5)

## 2024-01-05 LAB — BASIC METABOLIC PANEL
BUN: 21 mg/dL (ref 6–23)
CO2: 29 meq/L (ref 19–32)
Calcium: 9.2 mg/dL (ref 8.4–10.5)
Chloride: 105 meq/L (ref 96–112)
Creatinine, Ser: 0.72 mg/dL (ref 0.40–1.20)
GFR: 79.8 mL/min (ref >60.00)
Glucose, Bld: 105 mg/dL — ABNORMAL HIGH (ref 70–99)
Potassium: 4.1 meq/L (ref 3.5–5.1)
Sodium: 142 meq/L (ref 135–145)

## 2024-01-05 LAB — VITAMIN B12: Vitamin B-12: >1537 pg/mL — ABNORMAL HIGH (ref 211–911)

## 2024-01-05 LAB — LIPID PANEL
Cholesterol: 146 mg/dL (ref 0–200)
HDL: 77 mg/dL (ref >39.00)
LDL Cholesterol: 53 mg/dL (ref 0–99)
NonHDL: 69.37
Total CHOL/HDL Ratio: 2
Triglycerides: 80 mg/dL (ref 0.0–149.0)
VLDL: 16 mg/dL (ref 0.0–40.0)

## 2024-01-05 NOTE — Progress Notes (Signed)
Results  improved  or in range  . No  new advice.

## 2024-01-08 ENCOUNTER — Encounter: Payer: Self-pay | Admitting: Internal Medicine

## 2024-01-08 ENCOUNTER — Other Ambulatory Visit: Payer: Self-pay | Admitting: Internal Medicine

## 2024-01-08 LAB — EXTRA SPECIMEN

## 2024-01-08 LAB — VITAMIN B6

## 2024-01-08 MED ORDER — DESVENLAFAXINE SUCCINATE ER 50 MG PO TB24: 50.0000 mg | ORAL_TABLET | Freq: Every day | ORAL | 3 refills | Status: DC

## 2024-01-08 MED ORDER — DULOXETINE HCL 30 MG PO CPEP: 30.0000 mg | ORAL_CAPSULE | Freq: Every day | ORAL | 0 refills | Status: DC

## 2024-01-08 NOTE — Progress Notes (Signed)
Wean cymbalta  alt 60 w 30 for 1-2 weeks then 30 mg for 1-2 weeks then stop and switch to pristiq 50 per day

## 2024-01-09 DIAGNOSIS — K080 Exfoliation of teeth due to systemic causes: Secondary | ICD-10-CM | POA: Diagnosis not present

## 2024-01-30 DIAGNOSIS — G4733 Obstructive sleep apnea (adult) (pediatric): Secondary | ICD-10-CM | POA: Diagnosis not present

## 2024-02-01 ENCOUNTER — Other Ambulatory Visit: Payer: Self-pay | Admitting: Family

## 2024-02-01 MED ORDER — DESVENLAFAXINE SUCCINATE ER 50 MG PO TB24: 50.0000 mg | ORAL_TABLET | Freq: Every day | ORAL | 3 refills | Status: DC

## 2024-02-15 DIAGNOSIS — D485 Neoplasm of uncertain behavior of skin: Secondary | ICD-10-CM | POA: Diagnosis not present

## 2024-02-15 DIAGNOSIS — L570 Actinic keratosis: Secondary | ICD-10-CM | POA: Diagnosis not present

## 2024-02-27 DIAGNOSIS — G4733 Obstructive sleep apnea (adult) (pediatric): Secondary | ICD-10-CM | POA: Diagnosis not present

## 2024-03-10 NOTE — Progress Notes (Signed)
 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  She is comfortable and sleeping well with CPAP. Breathing is comfortable without routine cough or wheeze. Difficult year after hospitalization for bowel perforation and sepsis complicating diverticulosis.  Now pending reanastomosis of colostomy.  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  Using CPAP nightly. Discussed the use of AI scribe software for clinical note transcription with the patient, who gave verbal consent to proceed.  History of Present Illness   The patient, with a history of sleep apnea and reflux, reports waking up at night to urinate and experiencing reflux symptoms. She has an adjustable bed to elevate her head, which helps with the reflux. Despite taking medication for reflux, she still experiences symptoms, particularly if she eats late in the evening. She uses a CPAP machine for sleep apnea and is comfortable with it, reporting that it helps with her snoring. She also mentions having a DNA test to determine the best medication for her reflux, which resulted in a change to a different medication.       Assessment and Plan:    Obstructive Sleep Apnea Effective CPAP use with settings 4-18 cm H2O, achieving acceptable apnea control. Therapy remains optimal. - Continue CPAP therapy with current settings.  Gastroesophageal Reflux Disease (GERD) Nocturnal reflux managed effectively with medication. No evidence of reflux-related lung issues. - Continue current reflux medication as prescribed by primary care physician. - Maintain use of adjustable bed to manage reflux symptoms.     Obesity -encourage efforts-diet/ exercise  ROS-see HPI + = positive Constitutional:   No-   weight loss, night sweats, fevers, chills, fatigue, lassitude. HEENT:   No-  headaches, difficulty swallowing, tooth/dental problems, sore throat,       No-  sneezing, itching, ear ache, nasal congestion, post nasal drip,  CV:  No-   chest pain, orthopnea, PND, swelling in lower extremities, anasarca,                                                 dizziness, palpitations Resp: No-   shortness of breath with exertion or at rest.              No-   productive cough,  No non-productive cough,  No- coughing up of blood.              No-   change in color of mucus.  No- wheezing.   Skin: No-   rash or lesions. GI:  + heartburn, indigestion, abdominal pain, nausea, vomiting,  GU:  MS:  +joint pain or swelling.  Neuro-     nothing unusual Psych:  No- change in mood or affect. No depression or anxiety.  No memory loss.  OBJ- Physical Exam General- Alert, Oriented, Affect-appropriate, Distress- none acute, +obese Skin- rash-none, lesions- none, excoriation- none Lymphadenopathy- none Head- atraumatic            Eyes- Gross vision intact, PERRLA, conjunctivae and secretions clear            Ears- Hearing, canals-normal            Nose- Clear, no-Septal dev, mucus, polyps, erosion, perforation             Throat- Mallampati III-IV , mucosa clear , drainage- none, tonsils- atrophic Neck- flexible , trachea midline, no  stridor , thyroid  nl, carotid no bruit Chest - symmetrical excursion , unlabored           Heart/CV- RRR , no murmur , no gallop  , no rub, nl s1 s2                           - JVD- none , edema- none, stasis changes- none, varices- none           Lung- clear to P&A, wheeze- none, cough- none , dullness-none, rub- none           Chest wall-  Abd-  Br/ Gen/ Rectal- Not done, not indicated Extrem- +cane Neuro- grossly intact to observation

## 2024-03-12 ENCOUNTER — Ambulatory Visit: Payer: Medicare Other | Admitting: Internal Medicine

## 2024-03-12 ENCOUNTER — Encounter: Payer: Self-pay | Admitting: Internal Medicine

## 2024-03-12 VITALS — BP 122/60 | HR 70 | Temp 97.8°F | Ht 65.0 in | Wt 242.0 lb

## 2024-03-12 DIAGNOSIS — E669 Obesity, unspecified: Secondary | ICD-10-CM | POA: Diagnosis not present

## 2024-03-12 DIAGNOSIS — G4733 Obstructive sleep apnea (adult) (pediatric): Secondary | ICD-10-CM

## 2024-03-12 DIAGNOSIS — Z6841 Body Mass Index (BMI) 40.0 and over, adult: Secondary | ICD-10-CM | POA: Diagnosis not present

## 2024-03-12 DIAGNOSIS — K219 Gastro-esophageal reflux disease without esophagitis: Secondary | ICD-10-CM

## 2024-03-12 NOTE — Patient Instructions (Signed)
 You are doing great!  We can continue CPAP auto 4-18  Please call if we can help

## 2024-03-15 ENCOUNTER — Other Ambulatory Visit: Payer: Self-pay | Admitting: Family

## 2024-04-08 NOTE — Progress Notes (Unsigned)
 No chief complaint on file.   HPI: Terri Rodriguez 78 y.o. come in for Chronic disease management   Changed cymbalta  to prostiq  in hopes could help with  mood   Depression  multiple medical problems limiting  mobility and now frustrating  with less than optima memory for her  See  Jan visit in record  ROS: See pertinent positives and negatives per HPI.  Past Medical History:  Diagnosis Date   ALLERGIC RHINITIS    Allergy    Anginal pain (HCC)    Blood transfusion without reported diagnosis    Cataract    small right   Cholelithiasis 06/09/2008   gallstones 2009 on ct scan   Complication of anesthesia 2012   went to ICU after bilateral knees , unstable vital signs oct 2012, has surgery since did ok   Coronary artery disease    30 % narrowing lad   Diverticulitis    EPICONDYLITIS, LATERAL    osteoarthritis, previous joint replacements   Fall 10/2018   GERD    Hepatic hemangioma 06/09/2008   Hiatal hernia 03/2000   Hx of adenomatous polyp of colon 06/05/2008   Hx of cardiac catheterization 2008   clean coronarys DrKelly    Hx of chest pain 2003   neg cath remot hx of narrowwing lad in 2003 nl 2008, none in many years   HYPERLIPIDEMIA    IBS (irritable bowel syndrome)    LEUKOPENIA, CHRONIC    Mononucleosis 1963   RAYNAUD'S SYNDROME, HX OF    improved after cardiac meds initiated   Rosacea    facial   Sleep apnea    SLEEP APNEA, OBSTRUCTIVE    cpap, settings "automatic" settings 11-12   Subacute thyroiditis    UNSPECIFIED ANEMIA     Family History  Problem Relation Age of Onset   Rheum arthritis Mother    Diabetes Mother    Heart failure Mother    Thrombocytopenia Mother    Sleep apnea Mother    Ulcers Mother        PUD   Deep vein thrombosis Father    Pulmonary embolism Father    Osteoarthritis Father    Atrial fibrillation Father    Dementia Father    Sleep apnea Father    Sleep apnea Brother    Obesity Brother    Sleep apnea Brother    Obesity  Brother    Atrial fibrillation Brother    Colon cancer Maternal Aunt 59   Colon cancer Maternal Aunt 90   Congenital adrenal hyperplasia Grandchild    Pancreatic cancer Other    Uterine cancer Other    Leukemia Other    Inflammatory bowel disease Other        aunt   Esophageal cancer Neg Hx    Rectal cancer Neg Hx    Stomach cancer Neg Hx    Colon polyps Neg Hx     Social History   Socioeconomic History   Marital status: Married    Spouse name: Not on file   Number of children: 1   Years of education: Not on file   Highest education level: Professional school degree (e.g., MD, DDS, DVM, JD)  Occupational History   Occupation: Physician  Tobacco Use   Smoking status: Never   Smokeless tobacco: Never  Vaping Use   Vaping status: Never Used  Substance and Sexual Activity   Alcohol use: Yes    Alcohol/week: 7.0 standard drinks of alcohol  Types: 7 Glasses of wine per week   Drug use: No   Sexual activity: Yes    Partners: Male    Birth control/protection: Surgical    Comment: TAH/BSO  Other Topics Concern   Not on file  Social History Narrative   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   Social Drivers of Corporate investment banker Strain: Low Risk  (11/26/2023)   Overall Financial Resource Strain (CARDIA)    Difficulty of Paying Living Expenses: Not hard at all  Food Insecurity: No Food Insecurity (11/26/2023)   Hunger Vital Sign    Worried About Running Out of Food in the Last Year: Never true    Ran Out of Food in the Last Year: Never true  Transportation Needs: No Transportation Needs (11/26/2023)   PRAPARE - Administrator, Civil Service (Medical): No    Lack of Transportation (Non-Medical): No  Physical Activity: Inactive (11/26/2023)   Exercise Vital Sign    Days of Exercise per Week: 0 days    Minutes of Exercise per Session: 0 min  Stress: No Stress Concern Present  (11/26/2023)   [employer] of Occupational Health - Occupational Stress Questionnaire    Feeling of Stress : Not at all  Social Connections: Socially Integrated (11/26/2023)   Social Connection and Isolation Panel [NHANES]    Frequency of Communication with Friends and Family: Twice a week    Frequency of Social Gatherings with Friends and Family: Twice a week    Attends Religious Services: More than 4 times per year    Active Member of [employer] or Organizations: Yes    Attends Banker Meetings: Never    Marital Status: Married    Outpatient Medications Prior to Visit  Medication Sig Dispense Refill   acetaminophen  (TYLENOL ) 500 MG tablet Take 1 tablet (500 mg total) by mouth every 6 (six) hours as needed for fever or mild pain. (Patient taking differently: Take 500 mg by mouth every morning.) 30 tablet 0   amLODipine  (NORVASC ) 5 MG tablet TAKE 1 TABLET BY MOUTH EVERY DAY 90 tablet 3   amoxicillin  (AMOXIL ) 500 MG capsule Take 2,000 mg by mouth See admin instructions. Take 2000 mg by mouth 1 hour prior to dental procedures. (Patient not taking: Reported on 01/03/2024)     aspirin  EC 81 MG tablet Take 81 mg by mouth daily.     atorvastatin  (LIPITOR) 20 MG tablet Take 1 tablet (20 mg total) by mouth daily. 90 tablet 3   Azelaic Acid  15 % cream Apply 1 application topically 2 (two) times daily as needed (rosacea). After skin is thoroughly washed and patted dry, gently but thoroughly massage a thin film of azelaic acid  cream into the affected area t daily,after shower.     calcium  elemental as carbonate (TUMS ULTRA 1000) 400 MG chewable tablet Chew 1,000 mg by mouth daily as needed for heartburn.     carvedilol  (COREG ) 3.125 MG tablet TAKE 1 TABLET BY MOUTH 2 TIMES DAILY WITH A MEAL 180 tablet 3   celecoxib  (CELEBREX ) 200 MG capsule TAKE 1 CAPSULE BY MOUTH ONCE DAILY MAY alternate WITH 100mg  90 capsule 0   desvenlafaxine  (PRISTIQ ) 50 MG 24 hr tablet Take 1 tablet (50 mg total) by  mouth daily. After weaning off cymbalta  30 tablet 3   esomeprazole (NEXIUM) 20 MG capsule Take 20 mg by mouth  daily at 12 noon.     fexofenadine (ALLEGRA) 180 MG tablet Take 180 mg by mouth daily with supper.     metroNIDAZOLE  (METROGEL ) 1 % gel Apply 1 Application topically daily as needed (rosacea).     Multiple Vitamin (MULTIVITAMIN WITH MINERALS) TABS tablet Take 1 tablet by mouth daily. Centrum Women's no Iron      nitroGLYCERIN  (NITROSTAT ) 0.4 MG SL tablet PLACE 1 TABLET UNDER THE TONGUE EVERY 5 MINUTES AS NEEDED FOR CHEST PAIN 25 tablet 0   psyllium (REGULOID) 0.52 g capsule Take 0.52 g by mouth 2 (two) times daily as needed (bowel).     ramipril  (ALTACE ) 10 MG capsule TAKE 1 CAPSULE BY MOUTH EVERY DAY 90 capsule 1   valACYclovir  (VALTREX ) 1000 MG tablet TAKE 2 TABLET BY MOUTH Q12 hours X 2 DOSES WITH FEVER BLISTERS 30 tablet 3   No facility-administered medications prior to visit.     EXAM:  There were no vitals taken for this visit.  There is no height or weight on file to calculate BMI.  GENERAL: vitals reviewed and listed above, alert, oriented, appears well hydrated and in no acute distress HEENT: atraumatic, conjunctiva  clear, no obvious abnormalities on inspection of external nose and ears OP : no lesion edema or exudate  NECK: no obvious masses on inspection palpation  LUNGS: clear to auscultation bilaterally, no wheezes, rales or rhonchi, good air movement CV: HRRR, no clubbing cyanosis or  peripheral edema nl cap refill  MS: moves all extremities without noticeable focal  abnormality PSYCH: pleasant and cooperative, no obvious depression or anxiety Lab Results  Component Value Date   WBC 3.0 (L) 01/05/2024   HGB 12.7 01/05/2024   HCT 37.2 01/05/2024   PLT 218.0 01/05/2024   GLUCOSE 105 (H) 01/05/2024   CHOL 146 01/05/2024   TRIG 80.0 01/05/2024   HDL 77.00 01/05/2024   LDLCALC 53 01/05/2024   ALT 29 01/05/2024   AST 40 (H) 01/05/2024   NA 142 01/05/2024   K  4.1 01/05/2024   CL 105 01/05/2024   CREATININE 0.72 01/05/2024   BUN 21 01/05/2024   CO2 29 01/05/2024   TSH 1.33 01/05/2024   INR 1.0 04/13/2021   HGBA1C 5.6 01/05/2024   BP Readings from Last 3 Encounters:  03/12/24 122/60  01/03/24 (!) 100/56  10/03/23 100/60    ASSESSMENT AND PLAN:  Discussed the following assessment and plan:  Depression, unspecified depression type  Mild neurocognitive disorder due to multiple etiologies  Medication management  Osteoarthritis of multiple joints, unspecified osteoarthritis type  -Patient advised to return or notify health care team  if  new concerns arise.  There are no Patient Instructions on file for this visit.   Daiveon Markman K. Diyan Dave M.D.

## 2024-04-09 ENCOUNTER — Encounter: Payer: Self-pay | Admitting: Internal Medicine

## 2024-04-09 ENCOUNTER — Ambulatory Visit (INDEPENDENT_AMBULATORY_CARE_PROVIDER_SITE_OTHER): Payer: Medicare Other | Admitting: Internal Medicine

## 2024-04-09 VITALS — BP 98/62 | HR 97 | Temp 98.1°F | Ht 60.0 in | Wt 240.0 lb

## 2024-04-09 DIAGNOSIS — Z79899 Other long term (current) drug therapy: Secondary | ICD-10-CM | POA: Diagnosis not present

## 2024-04-09 DIAGNOSIS — F32A Depression, unspecified: Secondary | ICD-10-CM | POA: Diagnosis not present

## 2024-04-09 DIAGNOSIS — K219 Gastro-esophageal reflux disease without esophagitis: Secondary | ICD-10-CM | POA: Diagnosis not present

## 2024-04-09 DIAGNOSIS — F0670 Mild neurocognitive disorder due to known physiological condition without behavioral disturbance: Secondary | ICD-10-CM

## 2024-04-09 DIAGNOSIS — G8929 Other chronic pain: Secondary | ICD-10-CM

## 2024-04-09 DIAGNOSIS — M159 Polyosteoarthritis, unspecified: Secondary | ICD-10-CM

## 2024-04-09 NOTE — Patient Instructions (Addendum)
 Consider adding   Wellbutrin  150 xr to add on   if needed for depression   For now  double dose of   Nexium  per day and life style changes  ie eating earlier etc .  Limit evening etoh etc   If  not helpful in 2-3 weeks send in message and we can consider new medicine for reflux.   HA could be from neck medications  other . BP on low side consider dec dose of amlodipine  to 2.5  if needed.  Still advise water  activity .   Ok to take  Celebrex   200 per day   for at least 2 weeks to be able to decide if helpful  . For function and pain .  Ok to see neurologist and fu .

## 2024-04-11 ENCOUNTER — Encounter: Payer: Self-pay | Admitting: Physician Assistant

## 2024-04-22 ENCOUNTER — Telehealth: Payer: Self-pay

## 2024-04-22 NOTE — Telephone Encounter (Signed)
 Contacted patient on preferred number listed in notes for scheduled AWV. Patient unable to complete visit today. Canceled and rescheduled.

## 2024-05-02 DIAGNOSIS — G4733 Obstructive sleep apnea (adult) (pediatric): Secondary | ICD-10-CM | POA: Diagnosis not present

## 2024-05-03 ENCOUNTER — Encounter: Payer: Self-pay | Admitting: Internal Medicine

## 2024-05-16 DIAGNOSIS — D2261 Melanocytic nevi of right upper limb, including shoulder: Secondary | ICD-10-CM | POA: Diagnosis not present

## 2024-05-16 DIAGNOSIS — L738 Other specified follicular disorders: Secondary | ICD-10-CM | POA: Diagnosis not present

## 2024-05-16 DIAGNOSIS — L821 Other seborrheic keratosis: Secondary | ICD-10-CM | POA: Diagnosis not present

## 2024-05-16 DIAGNOSIS — L578 Other skin changes due to chronic exposure to nonionizing radiation: Secondary | ICD-10-CM | POA: Diagnosis not present

## 2024-05-22 ENCOUNTER — Encounter: Payer: Self-pay | Admitting: Cardiovascular Disease

## 2024-05-22 ENCOUNTER — Ambulatory Visit: Attending: Cardiovascular Disease | Admitting: Cardiovascular Disease

## 2024-05-22 VITALS — BP 114/60 | HR 71 | Ht 67.0 in | Wt 237.4 lb

## 2024-05-22 DIAGNOSIS — I2510 Atherosclerotic heart disease of native coronary artery without angina pectoris: Secondary | ICD-10-CM | POA: Diagnosis not present

## 2024-05-22 DIAGNOSIS — E785 Hyperlipidemia, unspecified: Secondary | ICD-10-CM | POA: Diagnosis not present

## 2024-05-22 DIAGNOSIS — G4733 Obstructive sleep apnea (adult) (pediatric): Secondary | ICD-10-CM | POA: Diagnosis not present

## 2024-05-22 DIAGNOSIS — I10 Essential (primary) hypertension: Secondary | ICD-10-CM | POA: Diagnosis not present

## 2024-05-22 DIAGNOSIS — I7300 Raynaud's syndrome without gangrene: Secondary | ICD-10-CM

## 2024-05-22 NOTE — Progress Notes (Signed)
 Patient ID: Terri Rodriguez, female   DOB: 02/07/1946, 78 y.o.   MRN: [id number]      Primary M.D.: Dr. Daphine Eagle  HPI: Terri Rodriguez is a 78 y.o. female who presents to the office today for 29 month follow-up cardiology evaluation.  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 Cardiac catheterization demonstrated a 30% tubular narrowing of the proximal LAD. She has a history of probable Raynaud's with a remote history of digital ischemia/spasm. She has been aggressively treated with medical therapy both for potential coronary vasospasm as well as lipid-lowering therapy for treatment methods to optimize endothelial function and blood pressure. She does have significant arthritic issues with degenerative disc disease and has undergone surgery involving L3-L4, L4-L5 by Dr. Cipriano Creeks. She also is status post bilateral knee surgery as well as left hip replacement. She has a history of obstructive sleep apnea on CPAP therapy, history of osteoarthritis which has been progressive. There also is a history of rosacea.    She sustained a left radius fracture and required open reduction and internal fixation in April 2015.  In August 2015, she underwent right hip replacement.  Her arthritis has limited her activity.  She has a history of obstructive sleep apnea and admits to 100% CPAP use.  She is unaware of breakthrough snoring.  She denies excessive daytime sleepiness.  When I saw her in April 2019 she denied any recurrent episodes of chest tightness or pressure. She denied any Raynaud's phenomenon. She was unaware of any palpitations.  She continues to have neuropathy in nerve pain issues for which she is on Cymbalta  and recently was started on pregabalin  with improvement.  There is no shortness of breath, chest pain, or digital vasospasm on amlodipine  5 mg, ramipril  10 mg, and carvedilol  3.125 mg twice a day.  She is on atorvastatin  20 mg. Lipid studies  in  2018 showed a total cholesterol 165, HDL 76, LDLs 73, which had risen from 59 one year previously, and triglycerides are 108.    I saw her in July 2019 at which time she was remaining cardiac stable.  She is a grandmother of 2 girls who have congenital adrenal hypoplasia are doing well.  She continued to have arthritic issues with both hips and knees.  He was not successful with weight loss she has not been successful with weight loss.  She uses CPAP with 100% compliance with her DME company being [employer] [facility] [employer].   She was seen October 2020.  At that time she denied any chest pain or shortness of breath.  She denies any Raynaud's symptoms.  She has been stable on a medical regimen to reduce potential for vasospasm as well as improve endothelial function including amlodipine  5 mg, ramipril  10 mg and carvedilol  3.125 mg.  Her blood pressure has been stable.  She is continue to be on atorvastatin  20 mg for hyperlipidemia.  She continues to have significant pain from her chronic arthritic issues and was on Cymbalta  for nerve pain.  Recently she has been bothered by arthritis in her neck.  She has seen Dr. Nudelman.  He does have issues with both knees, both hips, and DJD of her back.  She had 100% compliance with CPAP.    I saw her on October 21, 2020.  Over the prior year she continued to do well.  She was having issues with arthritic pain leading to neuropathy for which she has been taking  gabapentin . She had seen Dr. Cipriano Creeks earlier this year but he since has retired.  She denies any chest pain or shortness of breath.  She continues to be on atorvastatin  20 mg for hyperlipidemia.  On medical therapy she has not had any Raynaud's symptoms.  She has been on amlodipine  5 mg, ramipril  10 mg and carvedilol  for hypertension and improvement in function..  She recently saw Dr. Ethel Henry who checked laboratory on October 15, 2020.  LDL cholesterol was excellent at 57.  She had minimal AST elevation at 47 and ALT  at 31.  Dr. Ethel Henry will be rechecking laboratory in February 2022 and discussed with her the possibility of ultrasound if they remain elevated.  I last saw her on December 13, 2021 at which time she denied any chest pain or shortness of breath.  She continued to have arthritic issues.  She denied any edema. She uses CPAP with 100% compliance which is followed by Dr. Linder Revere.  Download from December 8 through December 10, 2021 showed average usage over 10 hours per night with AHI 2.6.  Her 95th percentile pressure was 13.9 with maximum average pressure 15.1 cm of water.  She had laboratory in August 2022 which showed stable hemoglobin and hematocrit.  Renal function was stable with creatinine 0.77.  TSH was normal at 1.58.  Total cholesterol 152 triglycerides 61 HDL 72 LDL 67.  LFTs are normal.    Since I last saw her, she was hospitalized in February 2023 in the setting of sepsis secondary to acute ischemic colitis with perforation and she required [facility] left colectomy/colostomy.  In October 2023 she underwent robotic colostomy reversal.  In January 2020 for she underwent cervical decompression at C4-5, C5-6, and C6-7 arthrodesis with structural titanium implant allograft and anterior fixation with cervical plate at W0-9 by Dr. Ellery Guthrie.  Presently, she denies any chest pain or shortness of breath.  She does have some memory issues.  She continues to be on amlodipine  5 mg and ramipril  10 mg daily in addition to carvedilol  3.125 mg twice a day for blood pressure control.  She is on atorvastatin  20 mg for lipid management.  She takes Pristiq  for depression after being weaned off Cymbalta .  She continues to use CPAP.  I obtained a download today from May 19 through May 21, 2024.  Usage is excellent with 100% use and average use slightly over 10 hours per night.  AHI is 3.6.  Her pressure apparently has been set at 4-18 range and her 95th percentile pressure is 11.6 with maximum average pressure 13.0.  She had seen Dr.  Linder Revere several months ago who has followed her sleep and she inform me of his upcoming retirement most likely in several months.  She presents for evaluation.   Past Medical History:  Diagnosis Date   ALLERGIC RHINITIS    Allergy    Anginal pain (HCC)    Blood transfusion without reported diagnosis    Cataract    small right   Cholelithiasis 06/09/2008   gallstones 2009 on ct scan   Complication of anesthesia 2012   went to ICU after bilateral knees , unstable vital signs oct 2012, has surgery since did ok   Coronary artery disease    30 % narrowing lad   Diverticulitis    EPICONDYLITIS, LATERAL    osteoarthritis, previous joint replacements   Fall 10/2018   GERD    Hepatic hemangioma 06/09/2008   Hiatal hernia 03/2000   Hx of adenomatous polyp of  colon 06/05/2008   Hx of cardiac catheterization 2008   clean coronarys DrKelly    Hx of chest pain 2003   neg cath remot hx of narrowwing lad in 2003 nl 2008, none in many years   HYPERLIPIDEMIA    IBS (irritable bowel syndrome)    LEUKOPENIA, CHRONIC    Mononucleosis 1963   RAYNAUD'S SYNDROME, HX OF    improved after cardiac meds initiated   Rosacea    facial   Sleep apnea    SLEEP APNEA, OBSTRUCTIVE    cpap, settings automatic settings 11-12   Subacute thyroiditis    UNSPECIFIED ANEMIA     Past Surgical History:  Procedure Laterality Date   ABDOMINAL HYSTERECTOMY  1993   bso   ANTERIOR CERVICAL DECOMP/DISCECTOMY FUSION N/A 12/20/2022   Procedure: Cervical four-five Cervical five-six Cervical six-seven Anterior Cervical Decompression Fusion;  Surgeon: Elna Haggis, MD;  Location: [facility] OR;  Service: Neurosurgery;  Laterality: N/A;   ANTERIOR FUSION CERVICAL SPINE N/A 12/20/2022   BACK SURGERY  2008   l 3 to l4 l4 to l5   BARTHOLIN GLAND CYST EXCISION Right 09/09/2019   Procedure: EXCISION OF VULVAR MASS TIMES TWO;  Surgeon: Lillian Rein, MD;  Location: [facility];  Service: Gynecology;  Laterality:  Right;   CARDIAC CATHETERIZATION  04/07/2007   Noncritical coronary artery disease. Contiue medical therapy.   CARDIAC CATHETERIZATION  12/03/2007   Normal LV function. Mild angiographic mitral valve prolapse. Normal coronary arteries.   CARDIOVASCULAR STRESS TEST  11/09/2007   Moderate ischemia in Mid Anterior, Mid Anteroseptal, Apical Anterior, and Apical Septal regions. EKG negative for ischemia.   CATARACT EXTRACTION Right 04/25/2023   CESAREAN SECTION  1985, 1989   COLON RESECTION Left 01/03/2022   Procedure: COLON RESECTION;  Surgeon: Junie Olds, MD;  Location: [facility];  Service: General;  Laterality: Left;   COLOSTOMY  01/03/2022   Procedure: COLOSTOMY;  Surgeon: Junie Olds, MD;  Location: [facility];  Service: General;;   dental implants  11/2002,11/2011,11/2012   dental implants     x 6   DENTAL SURGERY     DILATION AND CURETTAGE OF UTERUS     d and e 1988 and 1990   JOINT REPLACEMENT  09/21/2011   bilateral knee   KNEE ARTHROSCOPY     bilateral   LAPAROTOMY N/A 01/03/2022   Procedure: EXPLORATORY LAPAROTOMY;  Surgeon: Junie Olds, MD;  Location: [facility];  Service: General;  Laterality: N/A;   lumbar surgery fixation with disc replacement  09/2007   Left   NASAL SEPTOPLASTY W/ TURBINOPLASTY  05/2000   NOCTURNAL POLYSOMNOGRAM  12/31/2006   Severe obstructive sleep apnea. AHI-87/hr   REPLACEMENT TOTAL KNEE  2012   bilateral   TONSILLECTOMY  1952   adenoids too   TOTAL HIP ARTHROPLASTY Left 10/21/2013   Procedure: LEFT TOTAL HIP ARTHROPLASTY ANTERIOR APPROACH;  Surgeon: Aurther Blue, MD;  Location: [facility];  Service: Orthopedics;  Laterality: Left;   TOTAL HIP ARTHROPLASTY Right 07/16/2014   Procedure: RIGHT TOTAL HIP ARTHROPLASTY ANTERIOR APPROACH;  Surgeon: Aurther Blue, MD;  Location: [facility];  Service: Orthopedics;  Laterality: Right;   TRANSTHORACIC ECHOCARDIOGRAM  11/09/2007   EF 58%, LV systolic function normal. Mild aortic root dilation.    WRIST SURGERY Left 04/03/2014   ORIF distal radial head fracture   XI ROBOTIC ASSISTED COLOSTOMY TAKEDOWN N/A 09/26/2022   Procedure: ROBOTIC OSTOMY REVERSAL;  Surgeon: Junie Olds, MD;  Location:  [facility];  Service: General;  Laterality: N/A;    Allergies  Allergen Reactions   Codeine Nausea Only   Crab [Shellfish Allergy] Itching and Nausea And Vomiting    Palm and sole of feet itch- stone crab  Per pt- betadine ok    Current Outpatient Medications  Medication Sig Dispense Refill   acetaminophen  (TYLENOL ) 500 MG tablet Take 1 tablet (500 mg total) by mouth every 6 (six) hours as needed for fever or mild pain. 30 tablet 0   amLODipine  (NORVASC ) 5 MG tablet TAKE 1 TABLET BY MOUTH EVERY DAY 90 tablet 3   aspirin  EC 81 MG tablet Take 81 mg by mouth daily.     atorvastatin  (LIPITOR) 20 MG tablet Take 1 tablet (20 mg total) by mouth daily. 90 tablet 3   Azelaic Acid  15 % cream Apply 1 application topically 2 (two) times daily as needed (rosacea). After skin is thoroughly washed and patted dry, gently but thoroughly massage a thin film of azelaic acid  cream into the affected area t daily,after shower.     calcium  elemental as carbonate (TUMS ULTRA 1000) 400 MG chewable tablet Chew 1,000 mg by mouth daily as needed for heartburn.     carvedilol  (COREG ) 3.125 MG tablet TAKE 1 TABLET BY MOUTH 2 TIMES DAILY WITH A MEAL 180 tablet 3   celecoxib  (CELEBREX ) 200 MG capsule TAKE 1 CAPSULE BY MOUTH ONCE DAILY MAY alternate WITH 100mg  90 capsule 0   desvenlafaxine  (PRISTIQ ) 50 MG 24 hr tablet Take 1 tablet (50 mg total) by mouth daily. After weaning off cymbalta  30 tablet 3   esomeprazole (NEXIUM) 20 MG capsule Take 20 mg by mouth daily at 12 noon.     fexofenadine (ALLEGRA) 180 MG tablet Take 180 mg by mouth daily with supper.     metroNIDAZOLE  (METROGEL ) 1 % gel Apply 1 Application topically daily as needed (rosacea).     Multiple Vitamin (MULTIVITAMIN WITH MINERALS) TABS tablet Take 1  tablet by mouth daily. Centrum Women's no Iron      nitroGLYCERIN  (NITROSTAT ) 0.4 MG SL tablet PLACE 1 TABLET UNDER THE TONGUE EVERY 5 MINUTES AS NEEDED FOR CHEST PAIN 25 tablet 0   psyllium (REGULOID) 0.52 g capsule Take 0.52 g by mouth 2 (two) times daily as needed (bowel).     ramipril  (ALTACE ) 10 MG capsule TAKE 1 CAPSULE BY MOUTH EVERY DAY 90 capsule 1   valACYclovir  (VALTREX ) 1000 MG tablet TAKE 2 TABLET BY MOUTH Q12 hours X 2 DOSES WITH FEVER BLISTERS 30 tablet 3   amoxicillin  (AMOXIL ) 500 MG capsule Take 2,000 mg by mouth See admin instructions. Take 2000 mg by mouth 1 hour prior to dental procedures. (Patient not taking: Reported on 05/22/2024)     No current facility-administered medications for this visit.    Social History   Socioeconomic History   Marital status: Married    Spouse name: Not on file   Number of children: 1   Years of education: Not on file   Highest education level: Professional school degree (e.g., MD, DDS, DVM, JD)  Occupational History   Occupation: Physician  Tobacco Use   Smoking status: Never   Smokeless tobacco: Never  Vaping Use   Vaping status: Never Used  Substance and Sexual Activity   Alcohol use: Yes    Alcohol/week: 7.0 standard drinks of alcohol    Types: 7 Glasses of wine per week   Drug use: No   Sexual activity: Yes    Partners: Male  Birth control/protection: Surgical    Comment: TAH/BSO  Other Topics Concern   Not on file  Social History Narrative   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   Social Drivers of Corporate investment banker Strain: Low Risk  (11/26/2023)   Overall Financial Resource Strain (CARDIA)    Difficulty of Paying Living Expenses: Not hard at all  Food Insecurity: No Food Insecurity (11/26/2023)   Hunger Vital Sign    Worried About Running Out of Food in the Last Year: Never true    Ran Out of Food in the Last Year: Never true   Transportation Needs: No Transportation Needs (11/26/2023)   PRAPARE - Administrator, Civil Service (Medical): No    Lack of Transportation (Non-Medical): No  Physical Activity: Unknown (11/26/2023)   Exercise Vital Sign    Days of Exercise per Week: 0 days    Minutes of Exercise per Session: Not on file  Recent Concern: Physical Activity - Inactive (11/26/2023)   Exercise Vital Sign    Days of Exercise per Week: 0 days    Minutes of Exercise per Session: 0 min  Stress: No Stress Concern Present (11/26/2023)   [employer] of Occupational Health - Occupational Stress Questionnaire    Feeling of Stress : Not at all  Social Connections: Socially Integrated (11/26/2023)   Social Connection and Isolation Panel    Frequency of Communication with Friends and Family: Twice a week    Frequency of Social Gatherings with Friends and Family: Twice a week    Attends Religious Services: More than 4 times per year    Active Member of [employer] or Organizations: Yes    Attends Banker Meetings: Never    Marital Status: Married  Catering manager Violence: Not At Risk (04/17/2023)   Humiliation, Afraid, Rape, and Kick questionnaire    Fear of Current or Ex-Partner: No    Emotionally Abused: No    Physically Abused: No    Sexually Abused: No    Family History  Problem Relation Age of Onset   Rheum arthritis Mother    Diabetes Mother    Heart failure Mother    Thrombocytopenia Mother    Sleep apnea Mother    Ulcers Mother        PUD   Deep vein thrombosis Father    Pulmonary embolism Father    Osteoarthritis Father    Atrial fibrillation Father    Dementia Father    Sleep apnea Father    Sleep apnea Brother    Obesity Brother    Sleep apnea Brother    Obesity Brother    Atrial fibrillation Brother    Colon cancer Maternal Aunt 59   Colon cancer Maternal Aunt 90   Congenital adrenal hyperplasia Grandchild    Pancreatic cancer Other    Uterine cancer Other     Leukemia Other    Inflammatory bowel disease Other        aunt   Esophageal cancer Neg Hx    Rectal cancer Neg Hx    Stomach cancer Neg Hx    Colon polyps Neg Hx     ROS General: Negative; No fevers, chills, or night sweats; positive for weight gain  HEENT: Negative; No changes in vision or hearing, sinus congestion, difficulty swallowing Pulmonary: Negative; No cough, wheezing, shortness of breath, hemoptysis Cardiovascular: Negative; No chest  pain, presyncope, syncope, palpitations GI: Positive for GERD No nausea, vomiting, diarrhea, or abdominal pain GU: Negative; No dysuria, hematuria, or difficulty voiding Musculoskeletal: Positive for lumbar disc surgery, bilateral knee replacements and bilateral hip replacements; now with neck discomfort from cervical disc disease Hematologic/Oncology: Negative; no easy bruising, bleeding Endocrine: Negative; no heat/cold intolerance; no diabetes Neuro: Negative; no changes in balance, headaches Skin: Negative; No rashes or skin lesions Psychiatric: Negative; No behavioral problems, depression Sleep: Positive for obstructive sleep apnea on CPAP therapy; No snoring, daytime sleepiness, hypersomnolence, bruxism, restless legs, hypnogognic hallucinations, no cataplexy Other comprehensive 14 point system review is negative.   PE BP 114/60   Pulse 71   Ht 5' 7 (1.702 m)   Wt 237 lb 6.4 oz (107.7 kg)   SpO2 96%   BMI 37.18 kg/m    Repeat blood pressure by me was 110/68  Wt Readings from Last 3 Encounters:  05/22/24 237 lb 6.4 oz (107.7 kg)  04/09/24 240 lb (108.9 kg)  03/12/24 242 lb (109.8 kg)   General: Alert, oriented, no distress.  Skin: normal turgor, no rashes, warm and dry HEENT: Normocephalic, atraumatic. Pupils equal round and reactive to light; sclera anicteric; extraocular muscles intact;  Nose without nasal septal hypertrophy Mouth/Parynx benign; Mallinpatti scale 3 Neck: No JVD, no carotid bruits; normal carotid  upstroke Lungs: clear to ausculatation and percussion; no wheezing or rales Chest wall: without tenderness to palpitation Heart: PMI not displaced, RRR, s1 s2 normal, 1/6 systolic murmur, no diastolic murmur, no rubs, gallops, thrills, or heaves Abdomen: soft, nontender; no hepatosplenomehaly, BS+; abdominal aorta nontender and not dilated by palpation. Back: no CVA tenderness Pulses 2+ Musculoskeletal: full range of motion, normal strength, no joint deformities Extremities: no clubbing cyanosis or edema, Homan's sign negative  Neurologic: grossly nonfocal; Cranial nerves grossly wnl Psychologic: Normal mood and affect  EKG Interpretation Date/Time:  Wednesday May 22 2024 12:00:24 EDT Ventricular Rate:  71 PR Interval:  152 QRS Duration:  94 QT Interval:  416 QTC Calculation: 452 R Axis:   -28  Text Interpretation: Normal sinus rhythm Incomplete right bundle branch block Nonspecific ST and T wave abnormality When compared with ECG of 05-Jan-2022 04:40, ST now depressed in Inferior leads Confirmed by Magnus Schuller ([id number]) on 05/22/2024 4:29:07 PM     December 13, 2021 ECG (independently read by me):  NSR at 64, IRBBB, T wave abnormality  October 21, 2020 ECG (independently read by me): Normal sinus rhythm at 72 bpm, incomplete right bundle branch block.  No ectopy.  Normal intervals.  October 2020 ECG (independently read by me): Normal sinus rhythm at 79 bpm.  Incomplete right bundle branch block.  No ectopy.  July 2019 ECG (independently read by me): Normal sinus rhythm at 70 bpm.  T wave abnormality, nonspecific V1 V2.  Normal intervals.  No ectopy.  April 2018 ECG (independently read by me): Normal sinus rhythm at 65 bpm.  Nonspecific T waves.  Normal intervals.  March 2017 ECG (independently read by me): Normal sinus rhythm at 81 bpm isolate a PVC.  Nonspecific T-wave changes with previously noted T-wave inversion in V1, V2.  QTc interval 487 ms.  February 2016 ECG (independently  read by me): Normal sinus rhythm at 81 bpm.  Previously noted T-wave inversion V1 and V2 which is old and has been documented for over 10 years.  Mild QTC increased at 469 ms  February 2015 ECG (independently read by me): Sinus rhythm at 76 beats per minute period. T  wave inversion in V1 V2 which is old.  LABS:    Latest Ref Rng & Units 01/05/2024    9:16 AM 04/10/2023   11:03 AM 12/20/2022    6:08 AM  BMP  Glucose 70 - 99 mg/dL 161  096  045   BUN 6 - 23 mg/dL 21  18  25    Creatinine 0.40 - 1.20 mg/dL 4.09  8.11  9.14   BUN/Creat Ratio 12 - 28  26    Sodium 135 - 145 mEq/L 142  143  138   Potassium 3.5 - 5.1 mEq/L 4.1  5.0  4.3   Chloride 96 - 112 mEq/L 105  106  106   CO2 19 - 32 mEq/L 29  27  23    Calcium  8.4 - 10.5 mg/dL 9.2  78.2  9.2       Latest Ref Rng & Units 01/05/2024    9:16 AM 04/10/2023   11:03 AM 01/04/2022    2:45 AM  Hepatic Function  Total Protein 6.0 - 8.3 g/dL 6.5  6.2  5.2   Albumin  3.5 - 5.2 g/dL 4.3  4.3  2.4   AST 0 - 37 U/L 40  48  31   ALT 0 - 35 U/L 29  33  21   Alk Phosphatase 39 - 117 U/L 85  98  86   Total Bilirubin 0.2 - 1.2 mg/dL 0.5  0.4  1.0   Bilirubin, Direct 0.0 - 0.3 mg/dL 0.1          Latest Ref Rng & Units 01/05/2024    9:16 AM 04/10/2023   11:03 AM 12/20/2022    6:08 AM  CBC  WBC 4.0 - 10.5 K/uL 3.0  3.3  6.0   Hemoglobin 12.0 - 15.0 g/dL 95.6  21.3  08.6   Hematocrit 36.0 - 46.0 % 37.2  37.0  37.1   Platelets 150.0 - 400.0 K/uL 218.0  228  222     Lab Results  Component Value Date   MCV 96.4 01/05/2024   MCV 96 04/10/2023   MCV 95.9 12/20/2022    Lab Results  Component Value Date   TSH 1.33 01/05/2024   Lab Results  Component Value Date   HGBA1C 5.6 01/05/2024   Lipid Panel     Component Value Date/Time   CHOL 146 01/05/2024 0916   CHOL 162 04/10/2023 1103   TRIG 80.0 01/05/2024 0916   HDL 77.00 01/05/2024 0916   HDL 85 04/10/2023 1103   CHOLHDL 2 01/05/2024 0916   VLDL 16.0 01/05/2024 0916   LDLCALC 53 01/05/2024  0916   LDLCALC 64 04/10/2023 1103   LDLCALC 57 10/15/2020 0922     RADIOLOGY: No results found.  IMPRESSION:  1. Coronary artery disease involving native coronary artery of native heart without angina pectoris   2. Essential hypertension   3. Hyperlipidemia LDL goal <70   4. OSA on CPAP   5. 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 She underwent repeat cardiac catheterization in 2008 prior to back surgery after a nuclear perfusion study suggested possible ischemia.  Coronary angiography showed normalization of coronary arteries without significant obstruction.  She has continued to well without recurrent anginal symptomatology.  She has a history of digital ischemia suggestive of Raynaud's symptomatology but this has completely stabilized  with her medical regimen now consisting of amlodipine  5 mg, ramipril  10 mg, carvedilol  3.125 mg.  She initially was started on this regimen and attempt to reduce vasospasm as well as improve endothelial function.  Presently, her blood pressure is stable and she continues to be on a regimen of ramipril  10 mg as well as amlodipine  5 mg.  She is no longer having any digital vasospasm suggesting appropriate vasodilation central endothelial function improvement.  She continues to be on carvedilol  3.125 mg twice a day.  Her blood pressure today is stable on her regimen.  I reviewed her hospitalization records from February 2023 when she developed acute ischemic colitis with perforation status post [facility] left colectomy/colostomy.  He subsequently had robotic colostomy reversal in October 2023 and underwent cervical decompression surgery involving C4-7 by Dr. Ellery Guthrie in January 2024.  Her ECG today is stable with previously noted incomplete right bundle branch block.  She has lost some weight  approximately 20 pounds since her last office visit with me.  I reviewed her CPAP download.  I suggested slight increase in her pressure range and instead of 4-18 suggested this be changed to 8 to 18 cm of water.  She is aware of my imminent retirement.  After Dr. Doretha Ganja, I have suggested sleep follow-up with Dr. Villa Greaser who is part of the pulmonary group.  I will transition her to the care of Dr. Janeece Mechanic at our [facility] office for follow-up cardiologic evaluation in 1 year or sooner as needed.   Millicent Ally, MD, [facility]  05/22/2024 4:45 PM

## 2024-05-22 NOTE — Patient Instructions (Signed)
 Medication Instructions:  NONE *If you need a refill on your cardiac medications before your next appointment, please call your pharmacy*  Lab Work: NONE If you have labs (blood work) drawn today and your tests are completely normal, you will receive your results only by: MyChart Message (if you have MyChart) OR A paper copy in the mail If you have any lab test that is abnormal or we need to change your treatment, we will call you to review the results.  Testing/Procedures: NONE  Follow-Up: At [facility], you and your health needs are our priority.  As part of our continuing mission to provide you with exceptional heart care, our providers are all part of one team.  This team includes your primary Cardiologist (physician) and Advanced Practice Providers or APPs (Physician Assistants and Nurse Practitioners) who all work together to provide you with the care you need, when you need it.  Your next appointment:   1 year(s)  Provider:   Sheryle Donning, MD  We recommend signing up for the patient portal called MyChart.  Sign up information is provided on this After Visit Summary.  MyChart is used to connect with patients for Virtual Visits (Telemedicine).  Patients are able to view lab/test results, encounter notes, upcoming appointments, etc.  Non-urgent messages can be sent to your provider as well.   To learn more about what you can do with MyChart, go to [email or web address].

## 2024-05-27 ENCOUNTER — Ambulatory Visit

## 2024-06-02 DIAGNOSIS — G4733 Obstructive sleep apnea (adult) (pediatric): Secondary | ICD-10-CM | POA: Diagnosis not present

## 2024-06-03 ENCOUNTER — Other Ambulatory Visit: Payer: Self-pay | Admitting: Internal Medicine

## 2024-06-03 ENCOUNTER — Other Ambulatory Visit: Payer: Self-pay | Admitting: Cardiovascular Disease

## 2024-06-04 ENCOUNTER — Other Ambulatory Visit: Payer: Self-pay

## 2024-06-04 MED ORDER — CELECOXIB 200 MG PO CAPS: ORAL_CAPSULE | ORAL | 0 refills | Status: DC

## 2024-06-12 DIAGNOSIS — M4726 Other spondylosis with radiculopathy, lumbar region: Secondary | ICD-10-CM | POA: Diagnosis not present

## 2024-06-12 DIAGNOSIS — Z6839 Body mass index (BMI) 39.0-39.9, adult: Secondary | ICD-10-CM | POA: Diagnosis not present

## 2024-06-12 DIAGNOSIS — M5416 Radiculopathy, lumbar region: Secondary | ICD-10-CM | POA: Diagnosis not present

## 2024-06-13 ENCOUNTER — Other Ambulatory Visit: Payer: Self-pay | Admitting: Cardiovascular Disease

## 2024-06-14 DIAGNOSIS — H26491 Other secondary cataract, right eye: Secondary | ICD-10-CM | POA: Diagnosis not present

## 2024-06-14 DIAGNOSIS — Z961 Presence of intraocular lens: Secondary | ICD-10-CM | POA: Diagnosis not present

## 2024-06-14 DIAGNOSIS — H16223 Keratoconjunctivitis sicca, not specified as Sjogren's, bilateral: Secondary | ICD-10-CM | POA: Diagnosis not present

## 2024-06-14 DIAGNOSIS — H353131 Nonexudative age-related macular degeneration, bilateral, early dry stage: Secondary | ICD-10-CM | POA: Diagnosis not present

## 2024-06-14 DIAGNOSIS — H25812 Combined forms of age-related cataract, left eye: Secondary | ICD-10-CM | POA: Diagnosis not present

## 2024-07-08 DIAGNOSIS — G4733 Obstructive sleep apnea (adult) (pediatric): Secondary | ICD-10-CM | POA: Diagnosis not present

## 2024-07-10 ENCOUNTER — Encounter: Payer: Self-pay | Admitting: Internal Medicine

## 2024-07-10 ENCOUNTER — Ambulatory Visit: Payer: Self-pay | Admitting: Internal Medicine

## 2024-07-10 ENCOUNTER — Ambulatory Visit: Admitting: Internal Medicine

## 2024-07-10 VITALS — BP 102/60 | HR 83 | Temp 97.6°F | Ht 67.0 in | Wt 234.0 lb

## 2024-07-10 DIAGNOSIS — M159 Polyosteoarthritis, unspecified: Secondary | ICD-10-CM | POA: Diagnosis not present

## 2024-07-10 DIAGNOSIS — F32A Depression, unspecified: Secondary | ICD-10-CM | POA: Diagnosis not present

## 2024-07-10 DIAGNOSIS — F0670 Mild neurocognitive disorder due to known physiological condition without behavioral disturbance: Secondary | ICD-10-CM

## 2024-07-10 DIAGNOSIS — Z6836 Body mass index (BMI) 36.0-36.9, adult: Secondary | ICD-10-CM | POA: Diagnosis not present

## 2024-07-10 DIAGNOSIS — Z79899 Other long term (current) drug therapy: Secondary | ICD-10-CM

## 2024-07-10 DIAGNOSIS — Z8719 Personal history of other diseases of the digestive system: Secondary | ICD-10-CM

## 2024-07-10 DIAGNOSIS — K219 Gastro-esophageal reflux disease without esophagitis: Secondary | ICD-10-CM

## 2024-07-10 DIAGNOSIS — Z9049 Acquired absence of other specified parts of digestive tract: Secondary | ICD-10-CM

## 2024-07-10 DIAGNOSIS — R7989 Other specified abnormal findings of blood chemistry: Secondary | ICD-10-CM

## 2024-07-10 LAB — BASIC METABOLIC PANEL WITH GFR
BUN: 25 mg/dL — ABNORMAL HIGH (ref 6–23)
CO2: 29 meq/L (ref 19–32)
Calcium: 9.6 mg/dL (ref 8.4–10.5)
Chloride: 105 meq/L (ref 96–112)
Creatinine, Ser: 0.74 mg/dL (ref 0.40–1.20)
GFR: 76.94 mL/min (ref >60.00)
Glucose, Bld: 108 mg/dL — ABNORMAL HIGH (ref 70–99)
Potassium: 4.3 meq/L (ref 3.5–5.1)
Sodium: 140 meq/L (ref 135–145)

## 2024-07-10 LAB — HEPATIC FUNCTION PANEL
ALT: 38 U/L — ABNORMAL HIGH (ref 0–35)
AST: 44 U/L — ABNORMAL HIGH (ref 0–37)
Albumin: 4.6 g/dL (ref 3.5–5.2)
Alkaline Phosphatase: 72 U/L (ref 39–117)
Bilirubin, Direct: 0.1 mg/dL (ref 0.0–0.3)
Total Bilirubin: 0.4 mg/dL (ref 0.2–1.2)
Total Protein: 6.5 g/dL (ref 6.0–8.3)

## 2024-07-10 NOTE — Progress Notes (Signed)
 Renal function is ok stable. Liver  slightly abnormal  still   Plan fu lfts in 3 months  and want   to get th team involved . May I do another  GI referral  about reflux and lfts and  advice about using glp1 tyle medication?

## 2024-07-10 NOTE — Patient Instructions (Addendum)
 Update  labs to day .  Celebrex  200 per day and tylenol  500 bid should be ok if helps function.  Can add extra 500 tylenol  f needed   If labs ok .   In future  when stable can do a trial off of atorvastatin  for a few weeks to see if helps pain  Restart and  see if effected by med.

## 2024-07-10 NOTE — Progress Notes (Signed)
 Chief Complaint  Patient presents with   Medical Management of Chronic Issues    3 month f/u.     HPI: Terri Rodriguez 78 y.o. come in for Chronic disease management  here with spouse   Mood and med:  MS pain  head neck on going  Sig arthritis   tylenol  500 bid   .  And celebrex  200 mg per day .   Not sure how much it helps  stiff  Now on pristiq   ? If adding wellbutrin would help see last notes   GI:   stable habits ocass constipation, but   reflux  pretty bad    difficult.  Need a new gi doc  Not success ful in getting weight loss  central obesity.  Concerns about memory  high functioning  baseline  so notices more and frustating  spouse says about the same maybe some worse . Has seen card s dr Burnard  and plan for 1 year fu after retires  see notes OSA  dr Neysa will retire and change to Dr Jude  They joined [facility]   working on  water exercise therapy mobility  back restricted  elsner offered  injections  as a possibility   ROS: See pertinent positives and negatives per HPI.  Past Medical History:  Diagnosis Date   ALLERGIC RHINITIS    Allergy    Anginal pain (HCC)    Blood transfusion without reported diagnosis    Cataract    small right   Cholelithiasis 06/09/2008   gallstones 2009 on ct scan   Complication of anesthesia 2012   went to ICU after bilateral knees , unstable vital signs oct 2012, has surgery since did ok   Coronary artery disease    30 % narrowing lad   Diverticulitis    EPICONDYLITIS, LATERAL    osteoarthritis, previous joint replacements   Fall 10/2018   GERD    Hepatic hemangioma 06/09/2008   Hiatal hernia 03/2000   Hx of adenomatous polyp of colon 06/05/2008   Hx of cardiac catheterization 2008   clean coronarys DrKelly    Hx of chest pain 2003   neg cath remot hx of narrowwing lad in 2003 nl 2008, none in many years   HYPERLIPIDEMIA    IBS (irritable bowel syndrome)    LEUKOPENIA, CHRONIC    Mononucleosis 1963   RAYNAUD'S SYNDROME, HX  OF    improved after cardiac meds initiated   Rosacea    facial   Sleep apnea    SLEEP APNEA, OBSTRUCTIVE    cpap, settings automatic settings 11-12   Subacute thyroiditis    UNSPECIFIED ANEMIA     Family History  Problem Relation Age of Onset   Rheum arthritis Mother    Diabetes Mother    Heart failure Mother    Thrombocytopenia Mother    Sleep apnea Mother    Ulcers Mother        PUD   Deep vein thrombosis Father    Pulmonary embolism Father    Osteoarthritis Father    Atrial fibrillation Father    Dementia Father    Sleep apnea Father    Sleep apnea Brother    Obesity Brother    Sleep apnea Brother    Obesity Brother    Atrial fibrillation Brother    Colon cancer Maternal Aunt 59   Colon cancer Maternal Aunt 90   Congenital adrenal hyperplasia Grandchild    Pancreatic cancer Other  Uterine cancer Other    Leukemia Other    Inflammatory bowel disease Other        aunt   Esophageal cancer Neg Hx    Rectal cancer Neg Hx    Stomach cancer Neg Hx    Colon polyps Neg Hx     Social History   Socioeconomic History   Marital status: Married    Spouse name: Not on file   Number of children: 1   Years of education: Not on file   Highest education level: Professional school degree (e.g., MD, DDS, DVM, JD)  Occupational History   Occupation: Physician  Tobacco Use   Smoking status: Never   Smokeless tobacco: Never  Vaping Use   Vaping status: Never Used  Substance and Sexual Activity   Alcohol use: Yes    Alcohol/week: 7.0 standard drinks of alcohol    Types: 7 Glasses of wine per week   Drug use: No   Sexual activity: Yes    Partners: Male    Birth control/protection: Surgical    Comment: TAH/BSO  Other Topics Concern   Not on file  Social History Narrative   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   Social Drivers of Corporate investment banker Strain: Low Risk  (07/09/2024)    Overall Financial Resource Strain (CARDIA)    Difficulty of Paying Living Expenses: Not hard at all  Food Insecurity: No Food Insecurity (07/09/2024)   Hunger Vital Sign    Worried About Running Out of Food in the Last Year: Never true    Ran Out of Food in the Last Year: Never true  Transportation Needs: No Transportation Needs (07/09/2024)   PRAPARE - Administrator, Civil Service (Medical): No    Lack of Transportation (Non-Medical): No  Physical Activity: Inactive (07/09/2024)   Exercise Vital Sign    Days of Exercise per Week: 0 days    Minutes of Exercise per Session: Not on file  Stress: No Stress Concern Present (07/09/2024)   [employer] of Occupational Health - Occupational Stress Questionnaire    Feeling of Stress: Only a little  Social Connections: Moderately Integrated (07/09/2024)   Social Connection and Isolation Panel    Frequency of Communication with Friends and Family: Once a week    Frequency of Social Gatherings with Friends and Family: Once a week    Attends Religious Services: More than 4 times per year    Active Member of [employer] or Organizations: Yes    Attends Engineer, structural: More than 4 times per year    Marital Status: Married    Outpatient Medications Prior to Visit  Medication Sig Dispense Refill   acetaminophen  (TYLENOL ) 500 MG tablet Take 1 tablet (500 mg total) by mouth every 6 (six) hours as needed for fever or mild pain. 30 tablet 0   amLODipine  (NORVASC ) 5 MG tablet TAKE 1 TABLET BY MOUTH EVERY DAY 90 tablet 3   aspirin  EC 81 MG tablet Take 81 mg by mouth daily.     atorvastatin  (LIPITOR) 20 MG tablet TAKE 1 TABLET BY MOUTH EVERY DAY 90 tablet 3   Azelaic Acid  15 % cream Apply 1 application topically 2 (two) times daily as needed (rosacea). After skin is thoroughly washed and patted dry, gently but thoroughly massage a thin film of azelaic acid  cream into the affected area t  daily,after shower.     calcium  elemental as  carbonate (TUMS ULTRA 1000) 400 MG chewable tablet Chew 1,000 mg by mouth daily as needed for heartburn.     carvedilol  (COREG ) 3.125 MG tablet TAKE 1 TABLET BY MOUTH TWICE DAILY WITH MEALS 180 tablet 3   celecoxib  (CELEBREX ) 200 MG capsule TAKE 1 CAPSULE BY MOUTH ONCE DAILY. MAY ALTERNATE WITH 100MG  90 capsule 0   desvenlafaxine  (PRISTIQ ) 50 MG 24 hr tablet TAKE 1 TABLET BY MOUTH ONCE DAILY - After weaning off cymbalta  90 tablet 3   esomeprazole (NEXIUM) 20 MG capsule Take 20 mg by mouth daily at 12 noon.     fexofenadine (ALLEGRA) 180 MG tablet Take 180 mg by mouth daily with supper.     metroNIDAZOLE  (METROGEL ) 1 % gel Apply 1 Application topically daily as needed (rosacea).     Multiple Vitamin (MULTIVITAMIN WITH MINERALS) TABS tablet Take 1 tablet by mouth daily. Centrum Women's no Iron      nitroGLYCERIN  (NITROSTAT ) 0.4 MG SL tablet PLACE 1 TABLET UNDER THE TONGUE EVERY 5 MINUTES AS NEEDED FOR CHEST PAIN 25 tablet 0   psyllium (REGULOID) 0.52 g capsule Take 0.52 g by mouth 2 (two) times daily as needed (bowel).     ramipril  (ALTACE ) 10 MG capsule TAKE 1 CAPSULE BY MOUTH EVERY DAY 90 capsule 3   valACYclovir  (VALTREX ) 1000 MG tablet TAKE 2 TABLET BY MOUTH Q12 hours X 2 DOSES WITH FEVER BLISTERS 30 tablet 3   amoxicillin  (AMOXIL ) 500 MG capsule Take 2,000 mg by mouth See admin instructions. Take 2000 mg by mouth 1 hour prior to dental procedures. (Patient not taking: Reported on 07/10/2024)     No facility-administered medications prior to visit.     EXAM:  BP 102/60 (BP Location: Left Arm, Patient Position: Sitting, Cuff Size: Large)   Pulse 83   Temp 97.6 F (36.4 C) (Oral)   Ht 5' 7 (1.702 m)   Wt 234 lb (106.1 kg)   SpO2 98%   BMI 36.65 kg/m   Body mass index is 36.65 kg/m.  GENERAL: vitals reviewed and listed above, alert, oriented, appears well hydrated and in no acute distress HEENT: atraumatic, conjunctiva  clear, no obvious abnormalities on inspection of external nose and  ears  NECK: no obvious masses on inspection palpation  LUNGS: clear to auscultation bilaterally, no wheezes, rales or rhonchi, good air movement CV: HRRR, no clubbing cyanosis or  peripheral edema nl cap refill  MS: sig hand deformity  gait walking stiff and  cautions  independent but with cane  PSYCH: pleasant and cooperative, no obvious depression or anxiety Lab Results  Component Value Date   WBC 3.0 (L) 01/05/2024   HGB 12.7 01/05/2024   HCT 37.2 01/05/2024   PLT 218.0 01/05/2024   GLUCOSE 108 (H) 07/10/2024   CHOL 146 01/05/2024   TRIG 80.0 01/05/2024   HDL 77.00 01/05/2024   LDLCALC 53 01/05/2024   ALT 38 (H) 07/10/2024   AST 44 (H) 07/10/2024   NA 140 07/10/2024   K 4.3 07/10/2024   CL 105 07/10/2024   CREATININE 0.74 07/10/2024   BUN 25 (H) 07/10/2024   CO2 29 07/10/2024   TSH 1.33 01/05/2024   INR 1.0 04/13/2021   HGBA1C 5.6 01/05/2024   BP Readings from Last 3 Encounters:  07/10/24 102/60  05/22/24 114/60  04/09/24 98/62    ASSESSMENT AND PLAN:  Discussed the following assessment and plan:  Osteoarthritis of multiple joints, unspecified osteoarthritis type -  Plan: Basic metabolic panel with GFR, Hepatic function panel  Medication management - Plan: Basic metabolic panel with GFR, Hepatic function panel  Body mass index (BMI) 36.0-36.9, adult  Depression, unspecified depression type  Gastroesophageal reflux disease, unspecified whether esophagitis present  Mild neurocognitive disorder due to multiple etiologies - at this time prefers to not fu with neurology  no fam hx alzheimer but later  father had some decline For now update  labs monitoring  Ok to take Celebrex  and tylenol  for now if helps   Also can do trial off  atorva to see if makes a difference  Continue to monitor  Risk benefit of medication discussed. Plan fu in 3 mos or so  Would hope outside  exposure and water activity would help pain management also . I would  consdier ordering glp1 but   since has sig relux sx of concern woul consider  if Gi team all in  or risk known and want to keep up muscle mass  I personally spent a total of 60 minutes in the care of the patient today including preparing to see the patient, getting/reviewing separately obtained history, performing a medically appropriate exam/evaluation, counseling and educating, placing orders, and documenting clinical information in the EHR.   -Patient advised to return or notify health care team  if  new concerns arise.  Patient Instructions  Update  labs to day .  Celebrex  200 per day and tylenol  500 bid should be ok if helps function.  Can add extra 500 tylenol  f needed   If labs ok .   In future  when stable can do a trial off of atorvastatin  for a few weeks to see if helps pain  Restart and  see if effected by med.  Maria Coin K. Moriya Mitchell M.D.

## 2024-07-11 NOTE — Progress Notes (Signed)
 Please do I referral  dx abnormal liver tests ,hx of diverticulitis and bowel resection.  Please refer to provider spouse sees .  If possible

## 2024-07-19 ENCOUNTER — Encounter

## 2024-07-19 ENCOUNTER — Encounter: Payer: Self-pay | Admitting: Physician Assistant

## 2024-07-23 DIAGNOSIS — K080 Exfoliation of teeth due to systemic causes: Secondary | ICD-10-CM | POA: Diagnosis not present

## 2024-08-14 DIAGNOSIS — L570 Actinic keratosis: Secondary | ICD-10-CM | POA: Diagnosis not present

## 2024-08-14 DIAGNOSIS — B351 Tinea unguium: Secondary | ICD-10-CM | POA: Diagnosis not present

## 2024-08-20 DIAGNOSIS — M5116 Intervertebral disc disorders with radiculopathy, lumbar region: Secondary | ICD-10-CM | POA: Diagnosis not present

## 2024-08-20 DIAGNOSIS — M5416 Radiculopathy, lumbar region: Secondary | ICD-10-CM | POA: Diagnosis not present

## 2024-08-20 DIAGNOSIS — M5117 Intervertebral disc disorders with radiculopathy, lumbosacral region: Secondary | ICD-10-CM | POA: Diagnosis not present

## 2024-08-23 ENCOUNTER — Telehealth: Payer: Self-pay | Admitting: Internal Medicine

## 2024-08-23 NOTE — Telephone Encounter (Signed)
 Copied from CRM #[id number]. Topic: General - Call Back - No Documentation >> Aug 22, 2024  4:55 PM Rea C wrote: Reason for CRM: Patient called in and stated that she did not hear from GI Referral from August. I did provide patient with the GI Office phone number. But, she also wanted Dr. Charlett to know that she did not hear from anyone since August.   Patient's contact:  [phone] (M)

## 2024-08-29 ENCOUNTER — Encounter: Payer: Self-pay | Admitting: Internal Medicine

## 2024-08-29 ENCOUNTER — Other Ambulatory Visit: Payer: Self-pay | Admitting: Internal Medicine

## 2024-08-29 DIAGNOSIS — R5381 Other malaise: Secondary | ICD-10-CM

## 2024-08-29 DIAGNOSIS — M2550 Pain in unspecified joint: Secondary | ICD-10-CM

## 2024-08-29 DIAGNOSIS — M159 Polyosteoarthritis, unspecified: Secondary | ICD-10-CM

## 2024-08-29 DIAGNOSIS — M51369 Other intervertebral disc degeneration, lumbar region without mention of lumbar back pain or lower extremity pain: Secondary | ICD-10-CM

## 2024-08-29 NOTE — Progress Notes (Signed)
 Spouse  give info about reerral for  water therapy that we disc at last visit .  Will place orders.

## 2024-09-10 ENCOUNTER — Other Ambulatory Visit: Payer: Self-pay | Admitting: Internal Medicine

## 2024-09-25 DIAGNOSIS — H353131 Nonexudative age-related macular degeneration, bilateral, early dry stage: Secondary | ICD-10-CM | POA: Diagnosis not present

## 2024-09-25 DIAGNOSIS — H40013 Open angle with borderline findings, low risk, bilateral: Secondary | ICD-10-CM | POA: Diagnosis not present

## 2024-09-25 DIAGNOSIS — H26491 Other secondary cataract, right eye: Secondary | ICD-10-CM | POA: Diagnosis not present

## 2024-09-30 ENCOUNTER — Ambulatory Visit

## 2024-10-08 DIAGNOSIS — H25812 Combined forms of age-related cataract, left eye: Secondary | ICD-10-CM | POA: Diagnosis not present

## 2024-10-15 ENCOUNTER — Ambulatory Visit (INDEPENDENT_AMBULATORY_CARE_PROVIDER_SITE_OTHER): Admitting: Internal Medicine

## 2024-10-15 VITALS — BP 108/60 | HR 62 | Temp 97.4°F | Ht 67.0 in | Wt 217.4 lb

## 2024-10-15 DIAGNOSIS — M2550 Pain in unspecified joint: Secondary | ICD-10-CM | POA: Diagnosis not present

## 2024-10-15 DIAGNOSIS — M159 Polyosteoarthritis, unspecified: Secondary | ICD-10-CM

## 2024-10-15 DIAGNOSIS — R198 Other specified symptoms and signs involving the digestive system and abdomen: Secondary | ICD-10-CM

## 2024-10-15 DIAGNOSIS — F324 Major depressive disorder, single episode, in partial remission: Secondary | ICD-10-CM | POA: Diagnosis not present

## 2024-10-15 DIAGNOSIS — R7989 Other specified abnormal findings of blood chemistry: Secondary | ICD-10-CM

## 2024-10-15 DIAGNOSIS — Z79899 Other long term (current) drug therapy: Secondary | ICD-10-CM

## 2024-10-15 DIAGNOSIS — Z9049 Acquired absence of other specified parts of digestive tract: Secondary | ICD-10-CM

## 2024-10-15 MED ORDER — BUPROPION HCL ER (XL) 150 MG PO TB24: 150.0000 mg | ORAL_TABLET | Freq: Every day | ORAL | 1 refills | Status: DC

## 2024-10-15 NOTE — Progress Notes (Signed)
 Chief Complaint  Patient presents with   Medical Management of Chronic Issues    80m f/u . Husband reports pt is doing pretty good, nothing new has GI appt in first wk of December.     HPI: Terri Rodriguez 78 y.o. come in for Chronic disease management  here with spouse   Can't see : to have  cataract  removal and also on  film help. Gi consult: schedules  for Dec 2 .  Nothing new yet .   Reflux  and  bowel irregulary .  Has some left over lizess  ? To try   Pain  once injection.  Some difference  1 clebrex and 1 tylenl in am and 1 hs . And extra if needed  recently has had some mid thoracic pain  Has upcoming consult with water therapy at [facility].  Mood on pristiq    spouse  brought up as we discussed adding on wellbutrin   to see if helps  Memory about the same  word  findings  Workingg on some weight loss control   ROS: See pertinent positives and negatives per HPI.  Past Medical History:  Diagnosis Date   ALLERGIC RHINITIS    Allergy    Anginal pain    Blood transfusion without reported diagnosis    Cataract    small right   Cholelithiasis 06/09/2008   gallstones 2009 on ct scan   Complication of anesthesia 2012   went to ICU after bilateral knees , unstable vital signs oct 2012, has surgery since did ok   Coronary artery disease    30 % narrowing lad   Diverticulitis    EPICONDYLITIS, LATERAL    osteoarthritis, previous joint replacements   Fall 10/2018   GERD    Hepatic hemangioma 06/09/2008   Hiatal hernia 03/2000   Hx of adenomatous polyp of colon 06/05/2008   Hx of cardiac catheterization 2008   clean coronarys DrKelly    Hx of chest pain 2003   neg cath remot hx of narrowwing lad in 2003 nl 2008, none in many years   HYPERLIPIDEMIA    IBS (irritable bowel syndrome)    LEUKOPENIA, CHRONIC    Mononucleosis 1963   RAYNAUD'S SYNDROME, HX OF    improved after cardiac meds initiated   Rosacea    facial   Sleep apnea    SLEEP APNEA, OBSTRUCTIVE    cpap,  settings automatic settings 11-12   Subacute thyroiditis    UNSPECIFIED ANEMIA     Family History  Problem Relation Age of Onset   Rheum arthritis Mother    Diabetes Mother    Heart failure Mother    Thrombocytopenia Mother    Sleep apnea Mother    Ulcers Mother        PUD   Deep vein thrombosis Father    Pulmonary embolism Father    Osteoarthritis Father    Atrial fibrillation Father    Dementia Father    Sleep apnea Father    Sleep apnea Brother    Obesity Brother    Sleep apnea Brother    Obesity Brother    Atrial fibrillation Brother    Colon cancer Maternal Aunt 59   Colon cancer Maternal Aunt 90   Congenital adrenal hyperplasia Grandchild    Pancreatic cancer Other    Uterine cancer Other    Leukemia Other    Inflammatory bowel disease Other        aunt   Esophageal  cancer Neg Hx    Rectal cancer Neg Hx    Stomach cancer Neg Hx    Colon polyps Neg Hx     Social History   Socioeconomic History   Marital status: Married    Spouse name: Not on file   Number of children: 1   Years of education: Not on file   Highest education level: Professional school degree (e.g., MD, DDS, DVM, JD)  Occupational History   Occupation: Physician  Tobacco Use   Smoking status: Never   Smokeless tobacco: Never  Vaping Use   Vaping status: Never Used  Substance and Sexual Activity   Alcohol use: Yes    Alcohol/week: 7.0 standard drinks of alcohol    Types: 7 Glasses of wine per week   Drug use: No   Sexual activity: Yes    Partners: Male    Birth control/protection: Surgical    Comment: TAH/BSO  Other Topics Concern   Not on file  Social History Narrative   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   Social Drivers of Corporate Investment Banker Strain: Low Risk  (10/15/2024)   Overall Financial Resource Strain (CARDIA)    Difficulty of Paying Living Expenses: Not hard at all  Food  Insecurity: No Food Insecurity (10/15/2024)   Hunger Vital Sign    Worried About Running Out of Food in the Last Year: Never true    Ran Out of Food in the Last Year: Never true  Transportation Needs: No Transportation Needs (10/15/2024)   PRAPARE - Administrator, Civil Service (Medical): No    Lack of Transportation (Non-Medical): No  Physical Activity: Inactive (10/15/2024)   Exercise Vital Sign    Days of Exercise per Week: 0 days    Minutes of Exercise per Session: Not on file  Stress: No Stress Concern Present (10/15/2024)   [employer] of Occupational Health - Occupational Stress Questionnaire    Feeling of Stress: Not at all  Social Connections: Unknown (10/15/2024)   Social Connection and Isolation Panel    Frequency of Communication with Friends and Family: Never    Frequency of Social Gatherings with Friends and Family: Once a week    Attends Religious Services: More than 4 times per year    Active Member of [employer] or Organizations: Not on file    Attends Banker Meetings: Not on file    Marital Status: Married    Outpatient Medications Prior to Visit  Medication Sig Dispense Refill   acetaminophen  (TYLENOL ) 500 MG tablet Take 1 tablet (500 mg total) by mouth every 6 (six) hours as needed for fever or mild pain. 30 tablet 0   amLODipine  (NORVASC ) 5 MG tablet TAKE 1 TABLET BY MOUTH EVERY DAY 90 tablet 3   aspirin  EC 81 MG tablet Take 81 mg by mouth daily.     atorvastatin  (LIPITOR) 20 MG tablet TAKE 1 TABLET BY MOUTH EVERY DAY 90 tablet 3   Azelaic Acid  15 % cream Apply 1 application topically 2 (two) times daily as needed (rosacea). After skin is thoroughly washed and patted dry, gently but thoroughly massage a thin film of azelaic acid  cream into the affected area t daily,after shower.     calcium  elemental as carbonate (TUMS ULTRA 1000) 400 MG chewable tablet Chew 1,000 mg by mouth daily as needed for heartburn.  carvedilol  (COREG ) 3.125  MG tablet TAKE 1 TABLET BY MOUTH TWICE DAILY WITH MEALS 180 tablet 3   celecoxib  (CELEBREX ) 200 MG capsule TAKE 1 CAPSULE BY MOUTH ONCE DAILY. MAY ALTERNATE WITH 100MG  90 capsule 0   desvenlafaxine  (PRISTIQ ) 50 MG 24 hr tablet TAKE 1 TABLET BY MOUTH ONCE DAILY - After weaning off cymbalta  90 tablet 3   esomeprazole (NEXIUM) 20 MG capsule Take 20 mg by mouth daily at 12 noon.     fexofenadine (ALLEGRA) 180 MG tablet Take 180 mg by mouth daily with supper.     metroNIDAZOLE  (METROGEL ) 1 % gel Apply 1 Application topically daily as needed (rosacea).     Multiple Vitamin (MULTIVITAMIN WITH MINERALS) TABS tablet Take 1 tablet by mouth daily. Centrum Women's no Iron      nitroGLYCERIN  (NITROSTAT ) 0.4 MG SL tablet PLACE 1 TABLET UNDER THE TONGUE EVERY 5 MINUTES AS NEEDED FOR CHEST PAIN 25 tablet 0   psyllium (REGULOID) 0.52 g capsule Take 0.52 g by mouth 2 (two) times daily as needed (bowel).     ramipril  (ALTACE ) 10 MG capsule TAKE 1 CAPSULE BY MOUTH EVERY DAY 90 capsule 3   valACYclovir  (VALTREX ) 1000 MG tablet TAKE 2 TABLET BY MOUTH Q12 hours X 2 DOSES WITH FEVER BLISTERS 30 tablet 3   amoxicillin  (AMOXIL ) 500 MG capsule Take 2,000 mg by mouth See admin instructions. Take 2000 mg by mouth 1 hour prior to dental procedures. (Patient not taking: Reported on 10/15/2024)     No facility-administered medications prior to visit.     EXAM:  BP 108/60 (BP Location: Left Arm, Patient Position: Sitting, Cuff Size: Large)   Pulse 62   Temp (!) 97.4 F (36.3 C) (Oral)   Ht 5' 7 (1.702 m)   Wt 217 lb 6.4 oz (98.6 kg)   SpO2 97%   BMI 34.05 kg/m   Body mass index is 34.05 kg/m.  GENERAL: vitals reviewed and listed above, alert, oriented, appears well hydrated and in no acute distress  HEENT: atraumatic, conjunctiva  clear, no obvious abnormalities on inspection of external nose and ears  NECK: no obvious masses on inspection palpation  Spine  no mid thorax point tenderness  CV: HRRR, no clubbing  cyanosisnl cap refill  MS: moves all extremities arthritis noted  PSYCH: pleasant and cooperative,brighter affect  today  Lab Results  Component Value Date   WBC 3.0 (L) 01/05/2024   HGB 12.7 01/05/2024   HCT 37.2 01/05/2024   PLT 218.0 01/05/2024   GLUCOSE 108 (H) 07/10/2024   CHOL 146 01/05/2024   TRIG 80.0 01/05/2024   HDL 77.00 01/05/2024   LDLCALC 53 01/05/2024   ALT 38 (H) 07/10/2024   AST 44 (H) 07/10/2024   NA 140 07/10/2024   K 4.3 07/10/2024   CL 105 07/10/2024   CREATININE 0.74 07/10/2024   BUN 25 (H) 07/10/2024   CO2 29 07/10/2024   TSH 1.33 01/05/2024   INR 1.0 04/13/2021   HGBA1C 5.6 01/05/2024   BP Readings from Last 3 Encounters:  10/15/24 108/60  07/10/24 102/60  05/22/24 114/60   Fibrosis 4 Score = 2.59 Score is based on outdated labs. ALT, AST, and platelets should all be measured within the last 6 months for an accurate FIB-4 Score  Fib-4 interpretation is not validated for people under 35 or over 13 years of age. However, scores under 2.0 are generally considered low risk.  ASSESSMENT AND PLAN:  Discussed the following assessment and plan:  Osteoarthritis of  multiple joints, unspecified osteoarthritis type  Medication management  Major depressive disorder in partial remission, unspecified whether recurrent - part improvment  Multiple joint pain  Abnormal liver function test  Irregular bowel habits  History of bowel resection Add on trial of Wellbutrin  and follow   seems improved at this visit   .   Agree with  plan of above issues  Would want to have the gi team address the gi s and also fu lfts   ( got interrupted after having  acute bowel perf and fu ) is on acetomenophen and Celebrex   at this time  wa sunder eval for poss masld  Plan fu  6 weeks virtual ok to assess  effect of add on wellbutrin  and update other  evaluations  Ok to try the samples of linzess   in interim  Hopefully water therapy will also help with pain management   -Patient advised to return or notify health care team  if  new concerns arise.  Patient Instructions  Ok to  try linzess   samples  left over to see if helps  before you see the Gi team.  Also plan  to have  Gi team .  Address . Liver and bowel function Add  augmentation  Wellbutrin       to medication to see If can help   Ask  Dr Colon dunker al about update imaging advice with new thoracic pain  area.     Nakeia Calvi K. Keymani Mclean M.D.

## 2024-10-15 NOTE — Patient Instructions (Addendum)
 Ok to  try linzess   samples  left over to see if helps  before you see the Gi team.  Also plan  to have  Gi team .  Address . Liver and bowel function Add  augmentation  Wellbutrin       to medication to see If can help   Ask  Dr Colon dunker al about update imaging advice with new thoracic pain  area.

## 2024-10-16 ENCOUNTER — Ambulatory Visit (HOSPITAL_BASED_OUTPATIENT_CLINIC_OR_DEPARTMENT_OTHER): Admitting: Physical Therapy

## 2024-10-16 DIAGNOSIS — Z6835 Body mass index (BMI) 35.0-35.9, adult: Secondary | ICD-10-CM | POA: Diagnosis not present

## 2024-10-16 DIAGNOSIS — M4726 Other spondylosis with radiculopathy, lumbar region: Secondary | ICD-10-CM | POA: Diagnosis not present

## 2024-10-18 DIAGNOSIS — H26491 Other secondary cataract, right eye: Secondary | ICD-10-CM | POA: Diagnosis not present

## 2024-10-22 DIAGNOSIS — Z1231 Encounter for screening mammogram for malignant neoplasm of breast: Secondary | ICD-10-CM | POA: Diagnosis not present

## 2024-10-22 LAB — HM MAMMOGRAPHY

## 2024-10-23 ENCOUNTER — Encounter: Payer: Self-pay | Admitting: Internal Medicine

## 2024-10-23 ENCOUNTER — Ambulatory Visit (HOSPITAL_BASED_OUTPATIENT_CLINIC_OR_DEPARTMENT_OTHER): Attending: Internal Medicine | Admitting: Physical Therapy

## 2024-10-23 ENCOUNTER — Other Ambulatory Visit: Payer: Self-pay

## 2024-10-23 ENCOUNTER — Encounter (HOSPITAL_BASED_OUTPATIENT_CLINIC_OR_DEPARTMENT_OTHER): Payer: Self-pay | Admitting: Physical Therapy

## 2024-10-23 DIAGNOSIS — M2550 Pain in unspecified joint: Secondary | ICD-10-CM | POA: Insufficient documentation

## 2024-10-23 DIAGNOSIS — M51369 Other intervertebral disc degeneration, lumbar region without mention of lumbar back pain or lower extremity pain: Secondary | ICD-10-CM | POA: Insufficient documentation

## 2024-10-23 DIAGNOSIS — M159 Polyosteoarthritis, unspecified: Secondary | ICD-10-CM | POA: Insufficient documentation

## 2024-10-23 DIAGNOSIS — M5459 Other low back pain: Secondary | ICD-10-CM | POA: Insufficient documentation

## 2024-10-23 DIAGNOSIS — R5381 Other malaise: Secondary | ICD-10-CM | POA: Insufficient documentation

## 2024-10-23 DIAGNOSIS — M6281 Muscle weakness (generalized): Secondary | ICD-10-CM | POA: Insufficient documentation

## 2024-10-23 NOTE — Therapy (Signed)
 OUTPATIENT PHYSICAL THERAPY THORACOLUMBAR EVALUATION   Patient Name: Terri Rodriguez MRN: [id number] DOB:Jun 07, 1946, 78 y.o., female Today's Date: 10/23/2024  END OF SESSION:  PT End of Session - 10/23/24 1442     Visit Number 1    Date for Recertification  01/03/25    Authorization Type [employer]    PT Start Time 1402    PT Stop Time 1444    PT Time Calculation (min) 42 min    Activity Tolerance Patient tolerated treatment well    Behavior During Therapy [facility] for tasks assessed/performed          Past Medical History:  Diagnosis Date   ALLERGIC RHINITIS    Allergy    Anginal pain    Blood transfusion without reported diagnosis    Cataract    small right   Cholelithiasis 06/09/2008   gallstones 2009 on ct scan   Complication of anesthesia 2012   went to ICU after bilateral knees , unstable vital signs oct 2012, has surgery since did ok   Coronary artery disease    30 % narrowing lad   Diverticulitis    EPICONDYLITIS, LATERAL    osteoarthritis, previous joint replacements   Fall 10/2018   GERD    Hepatic hemangioma 06/09/2008   Hiatal hernia 03/2000   Hx of adenomatous polyp of colon 06/05/2008   Hx of cardiac catheterization 2008   clean coronarys DrKelly    Hx of chest pain 2003   neg cath remot hx of narrowwing lad in 2003 nl 2008, none in many years   HYPERLIPIDEMIA    IBS (irritable bowel syndrome)    LEUKOPENIA, CHRONIC    Mononucleosis 1963   RAYNAUD'S SYNDROME, HX OF    improved after cardiac meds initiated   Rosacea    facial   Sleep apnea    SLEEP APNEA, OBSTRUCTIVE    cpap, settings automatic settings 11-12   Subacute thyroiditis    UNSPECIFIED ANEMIA    Past Surgical History:  Procedure Laterality Date   ABDOMINAL HYSTERECTOMY  1993   bso   ANTERIOR CERVICAL DECOMP/DISCECTOMY FUSION N/A 12/20/2022   Procedure: Cervical four-five Cervical five-six Cervical six-seven Anterior Cervical Decompression Fusion;  Surgeon: Colon Shove, MD;   Location: [facility] OR;  Service: Neurosurgery;  Laterality: N/A;   ANTERIOR FUSION CERVICAL SPINE N/A 12/20/2022   BACK SURGERY  2008   l 3 to l4 l4 to l5   BARTHOLIN GLAND CYST EXCISION Right 09/09/2019   Procedure: EXCISION OF VULVAR MASS TIMES TWO;  Surgeon: Cleotilde Ronal RAMAN, MD;  [facility];  Service: Gynecology;  Laterality: Right;   CARDIAC CATHETERIZATION  04/07/2007   Noncritical coronary artery disease. Contiue medical therapy.   CARDIAC CATHETERIZATION  12/03/2007   Normal LV function. Mild angiographic mitral valve prolapse. Normal coronary arteries.   CARDIOVASCULAR STRESS TEST  11/09/2007   Moderate ischemia in Mid Anterior, Mid Anteroseptal, Apical Anterior, and Apical Septal regions. EKG negative for ischemia.   CATARACT EXTRACTION Right 04/25/2023   CESAREAN SECTION  1985, 1989   COLON RESECTION Left 01/03/2022   Procedure: COLON RESECTION;  Surgeon: Lyndel Deward PARAS, MD;  Location: [facility] ORS;  Service: General;  Laterality: Left;   COLOSTOMY  01/03/2022   Procedure: COLOSTOMY;  Surgeon: Lyndel Deward PARAS, MD;  Location: [facility];  Service: General;;   dental implants  11/2002,11/2011,11/2012   dental implants     x 6   DENTAL SURGERY     DILATION AND CURETTAGE  OF UTERUS     d and e 1988 and 1990   JOINT REPLACEMENT  09/21/2011   bilateral knee   KNEE ARTHROSCOPY     bilateral   LAPAROTOMY N/A 01/03/2022   Procedure: EXPLORATORY LAPAROTOMY;  Surgeon: Lyndel Deward PARAS, MD;  Location: [facility];  Service: General;  Laterality: N/A;   lumbar surgery fixation with disc replacement  09/2007   Left   NASAL SEPTOPLASTY W/ TURBINOPLASTY  05/2000   NOCTURNAL POLYSOMNOGRAM  12/31/2006   Severe obstructive sleep apnea. AHI-87/hr   REPLACEMENT TOTAL KNEE  2012   bilateral   TONSILLECTOMY  1952   adenoids too   TOTAL HIP ARTHROPLASTY Left 10/21/2013   Procedure: LEFT TOTAL HIP ARTHROPLASTY ANTERIOR APPROACH;  Surgeon: Dempsey LULLA Moan, MD;  Location: [facility];  Service: Orthopedics;  Laterality: Left;   TOTAL HIP ARTHROPLASTY Right 07/16/2014   Procedure: RIGHT TOTAL HIP ARTHROPLASTY ANTERIOR APPROACH;  Surgeon: Dempsey Moan LULLA, MD;  Location: [facility];  Service: Orthopedics;  Laterality: Right;   TRANSTHORACIC ECHOCARDIOGRAM  11/09/2007   EF 58%, LV systolic function normal. Mild aortic root dilation.   WRIST SURGERY Left 04/03/2014   ORIF distal radial head fracture   XI ROBOTIC ASSISTED COLOSTOMY TAKEDOWN N/A 09/26/2022   Procedure: ROBOTIC OSTOMY REVERSAL;  Surgeon: Lyndel Deward PARAS, MD;  Location: [facility];  Service: General;  Laterality: N/A;   Patient Active Problem List   Diagnosis Date Noted   Cervical myelopathy (HCC) 12/20/2022   History of creation of ostomy (HCC) 09/26/2022   Colostomy in place (HCC) 01/09/2022   IBS (irritable bowel syndrome) 01/05/2022   Physical deconditioning 01/05/2022   Perforation of colon s/p Hartmann colectomy/colostomy 01/03/2022 01/02/2022   Lumbar post-laminectomy syndrome 10/07/2021   Cervical spondylosis without myelopathy 10/07/2021   Primary osteoarthritis of both feet 06/18/2017   Status post bilateral total hip replacement 06/18/2017   Status post total knee replacement, bilateral 06/18/2017   Degenerative disc disease, lumbar status post fusion 06/18/2017   Degenerative disc disease, cervical 06/18/2017   History of cholelithiasis 06/18/2017   Other chronic pain 02/01/2017   Visit for preventive health examination 01/21/2015   Medicare annual wellness visit, subsequent 01/21/2015   BMI 40.0-44.9, adult (HCC) 01/21/2015   Hyperlipidemia LDL goal <70 01/12/2015   Morbid obesity (HCC) 01/12/2015   Hyperglycemia 01/10/2014   Mild CAD 01/06/2014   Medication monitoring encounter 09/22/2012   Hx of cardiac catheterization    Adenomatous polyp of colon 06/14/2012   ROSACEA 04/26/2010   Obstructive sleep apnea 09/09/2009   Normocytic anemia 12/15/2008   LEUKOPENIA, CHRONIC 01/08/2008    Elevated lipids 08/17/2007   ALLERGIC RHINITIS 08/17/2007   GERD 08/17/2007   Primary osteoarthritis of both hands 08/17/2007    PCP: Apolinar Eastern MD  REFERRING PROVIDER: Apolinar Eastern MD  REFERRING DIAG:  M15.9 (ICD-10-CM) - Osteoarthritis of multiple joints, unspecified osteoarthritis type  R53.81 (ICD-10-CM) - Physical deconditioning  M51.369 (ICD-10-CM) - Degeneration of intervertebral disc of lumbar region, unspecified whether pain present  M25.50 (ICD-10-CM) - Multiple joint pain    Rationale for Evaluation and Treatment: Rehabilitation  THERAPY DIAG:  Other low back pain  Multiple joint pain  Muscle weakness (generalized)  ONSET DATE: chronic  SUBJECTIVE:  SUBJECTIVE STATEMENT: Got an injection by Dr Colon a few weeks ago in my left LB and it helped, stopped the pain down my left leg. There has been one thing after another.  My whole body hurts.  I am losing my strength.  I was coming up front at [facility] to exercises 1 x week.    PERTINENT HISTORY:  ACDF 12/20/22, Long history of medical and surgical diagnoses. Memory issues, LBP since surgery 2008, fibromyalgia, B hip and knee replacements, R achilles tear 2022   PAIN:  Are you having pain? Yes: NPRS scale: current 3-4/10  Pain location: LB, hips   Pain description: ache Aggravating factors: not having meds Relieving factors: resting, taking meds  PRECAUTIONS: Fall  RED FLAGS: None   WEIGHT BEARING RESTRICTIONS: No  FALLS:  Has patient fallen in last 6 months? No  LIVING ENVIRONMENT: Lives with: lives with their spouse Lives in: House/apartment Stairs: Yes: Internal: 16 steps; on right going up Has following equipment at home: Vannie - 4 wheeled  OCCUPATION: retired physician  PLOF: Requires assistive device for  independence and Needs assistance with ADLs  PATIENT GOALS: I don't know,  My husband wanted me to come, build up some ms strength  NEXT MD VISIT: as needed  OBJECTIVE:  Note: Objective measures were completed at Evaluation unless otherwise noted.  DIAGNOSTIC FINDINGS:  N/a  PATIENT SURVEYS:  ODI: 30/50=60%  COGNITION: Overall cognitive status: History of cognitive impairments - at baseline     SENSATION: WFL   POSTURE: rounded shoulders, forward head, and decreased lumbar lordosis  PALPATION: Slight TTP about lumbar paraspinals  LUMBAR ROM:   AROM eval  Flexion FT to below patella  Extension Neutral  Right lateral flexion 75% limited P!  Left lateral flexion 50% Limited P!  Right rotation   Left rotation    (Blank rows = not tested)  LOWER EXTREMITY Strength:     MMT Right eval Left eval  Hip flexion 4 4  Hip extension    Hip abduction 5 5  Hip adduction 5 5  Hip internal rotation    Hip external rotation    Knee flexion    Knee extension 4+ 4+  Ankle dorsiflexion 5 4-  Ankle plantarflexion    Ankle inversion    Ankle eversion     (Blank rows = not tested)  LOWER EXTREMITY ROM:    WFL  LUMBAR SPECIAL TESTS:  Slump test: Negative  FUNCTIONAL TESTS:  Item Test date: 10/23/24 Date:  Date:   Sitting to standing 3. able to stand independently using hands Insert SmartPhrase OPRCBERGREEVAL Insert SmartPhrase OPRCBERGREEVAL  2. Standing unsupported 4. able to stand safely for 2 minutes    3. Sitting with back unsupported, feet supported 4. able to sit safely and securely for 2 minutes    4. Standing to sitting 2. uses back of legs against chair to control descent    5. Pivot transfer  3. able to transfer safely with definite need of hands    6. Standing unsupported with eyes closed 3. able to stand 10 seconds with supervision    7. Standing unsupported with feet together 2. able to place feet together independently but unable to hold for 30 seconds     8. Reaching forward with outstretched arms while standing 2. can reach forward 5 cm (2 inches)    9. Pick up object from the floor from standing 3. able to pick up slipper but needs supervision    10. Turning  to look behind over left and right shoulders while standing 2. turns sideways but only maintains balance    11. Turn 360 degrees 0. needs assistance while turning    12. Place alternate foot on step or stool while standing unsupported 0. needs assistance to keep from falling/unable to try    13. Standing unsupported one foot in front 0. loses balance while stepping or standing    14. Standing on one leg 0. unable to try of needs assist to prevent fall      Total Score 28/56 Total Score:    Total Score:     TUG 34.95   GAIT: Distance walked: 400 ft Assistive device utilized: Walker - 4 wheeled Level of assistance: SBA Comments: using rollator: shortened step length, reduced cadence  TREATMENT  Eval Self care:Posture and optometrist instruction                                                                                                                               PATIENT EDUCATION:  Education details: Discussed eval findings, rehab rationale, aquatic program progression/POC and pools in area. Patient is in agreement  Person educated: Patient Education method: Explanation Education comprehension: verbalized understanding  HOME EXERCISE PROGRAM: TBA  ASSESSMENT:  CLINICAL IMPRESSION: Patient is a 78 y.o. f who was seen today for physical therapy evaluation and treatment for Oa multiple joints with LBP being most limiting. Pt is well known to this clinic as she has been seen for other episodes. She is a member here at [facility] and reports coming and using some exercise machines weekly. Patient presents with pain deficits in LB primarily but also hips and knees limiting her (lumbar) ROM, strength, endurance, activity tolerance, gait, balance, and functional mobility with  ADL's. She uses rollator at all times in and out of home. Has not had any recent falls. She does have mild cognitive dysfunction. She will benefit from an episode of skilled aquatic PT to improve all areas of deficits, objective impairments as listed below and manage chronic pain.   OBJECTIVE IMPAIRMENTS: Abnormal gait, decreased activity tolerance, decreased balance, decreased coordination, decreased mobility, difficulty walking, decreased ROM, decreased strength, postural dysfunction, and pain.   ACTIVITY LIMITATIONS: carrying, lifting, bending, sitting, standing, squatting, stairs, transfers, locomotion level, and caring for others  PARTICIPATION LIMITATIONS: meal prep, cleaning, laundry, driving, shopping, community activity, and yard work  PERSONAL FACTORS: Time since onset of injury/illness/exacerbation are also affecting patient's functional outcome.   REHAB POTENTIAL: Fair chronicity of condition  CLINICAL DECISION MAKING: Evolving/moderate complexity  EVALUATION COMPLEXITY: Moderate   GOALS: Goals reviewed with patient? Yes  SHORT TERM GOALS: Target date: 11/29/24  Pt will tolerate full aquatic sessions consistently without increase in pain and with improving function to demonstrate good toleration and effectiveness of intervention.  Baseline: Goal status: INITIAL  2.  Pt will tolerate walking to and from setting and engaging in aquatic therapy session without excessive fatigue or increase  in pain to demonstrate improved toleration to activity.  Baseline:  Goal status: INITIAL     LONG TERM GOALS: Target date: 01/03/25  Pt to improve on ODI by 13% to demonstrate statistically significant Improvement in function. (MCID 13-15%) Baseline: 30/50=60% Goal status: INITIAL  2.  Pt will improve strength in all areas to 5/5 to demonstrate improved overall physical function Baseline:  Goal status: INITIAL  3.  Pt will improve on Berg balance test to >/= 38/56 to demonstrate  a decrease in fall risk. (MDC 7) Baseline: 28/56 Goal status: INITIAL  4.  Pt will improve on Tug test to <or= 22s to demonstrate improvement in lower extremity function, mobility and decreased fall risk. (Geriatric rehab avg @ DC= 21.2) Baseline: 34.95 Goal status: INITIAL  5.  Pt will report decrease in pain by at least 50% for improved toleration to activity/quality of life and to demonstrate improved management of pain. Baseline: see chart Goal status: INITIAL   PLAN:  PT FREQUENCY: 1x/week  PT DURATION: 10 weeks  likely 8 visits  PLANNED INTERVENTIONS: [id number]- PT Re-evaluation, 97750- Physical Performance Testing, 97110-Therapeutic exercises, 97530- Therapeutic activity, [id number]- Neuromuscular re-education, 97535- Self Care, [id number]- Manual therapy, [phone]- Gait training, [phone]- Aquatic Therapy, [phone]- Ionotophoresis 4mg /ml Dexamethasone , 79439 (1-2 muscles), 20561 (3+ muscles)- Dry Needling, Patient/Family education, Balance training, Stair training, Taping, Joint mobilization, DME instructions, Cryotherapy, and Moist heat.  PLAN FOR NEXT SESSION: aquatic: general strengthening, ROM, stretching; gait, pain management, balance retraining   Ronal Foots) Kree Rafter MPT 10/23/24 5:50 PM [facility] [address] [place], [place], [address] Phone: [phone]   Fax:  [phone]

## 2024-10-29 DIAGNOSIS — I2510 Atherosclerotic heart disease of native coronary artery without angina pectoris: Secondary | ICD-10-CM | POA: Diagnosis not present

## 2024-10-29 DIAGNOSIS — H25812 Combined forms of age-related cataract, left eye: Secondary | ICD-10-CM | POA: Diagnosis not present

## 2024-10-29 DIAGNOSIS — I10 Essential (primary) hypertension: Secondary | ICD-10-CM | POA: Diagnosis not present

## 2024-10-29 DIAGNOSIS — E7849 Other hyperlipidemia: Secondary | ICD-10-CM | POA: Diagnosis not present

## 2024-10-29 DIAGNOSIS — H268 Other specified cataract: Secondary | ICD-10-CM | POA: Diagnosis not present

## 2024-10-30 ENCOUNTER — Ambulatory Visit (HOSPITAL_BASED_OUTPATIENT_CLINIC_OR_DEPARTMENT_OTHER): Admitting: Physical Therapy

## 2024-11-04 NOTE — Progress Notes (Unsigned)
 [facility] Initial Consultation   Referring Provider Panosh, Apolinar POUR, MD [address] [place],  [place] [address]  Primary Care Provider Panosh, Apolinar POUR, MD  Patient Profile: Terri Rodriguez is a 78 y.o. female who is seen in consultation in the [facility] at the request of Dr. Charlett for evaluation and management of the problem(s) noted below.  Problem List: GERD Hiatal hernia Status post left hemicolectomy and transverse colostomy 01/03/2022 for ischemic perforation related to medications, hypotension and ischemia; bowel reanastomosis 09/2022 Constipation History of colon polyps-tubular adenomas and sessile serrated polyps Family history of colorectal cancer-2 maternal aunts Elevated liver enzymes-possible cirrhosis-suspected ASH vs. MASH Cholelithiasis  History of Present Illness   Terri Rodriguez is a 78 y.o. female with a history of CAD, OSA, HLD,.  Discussed the use of AI scribe software for clinical note transcription with the patient, who gave verbal consent to proceed.  History of Present Illness Terri Rodriguez is a 78 year old female who presents for evaluation of liver function and constipation management. She was referred by her primary care doctor for evaluation of elevated liver enzymes.  Elevated liver enzymes -suspected ASH versus MASH - Elevated liver enzymes have been monitored over time. - Serologic workup in 2022 ruled out autoimmune hepatitis, hemochromatosis, and Wilson's disease. - Ultrasound with elastography was recommended in 2022 but not completed, possibly due to colon surgery and hospitalization in 2023.  Advised scheduling and patient is agreeable - History of hepatic hemangioma dating back to MRI from 2009 - Immune to hepatitis A and hepatitis B - Current BMI 35.61 - Fibrosis 4 Score = 2.46 -although not validated for individuals over the age of 20 - No family history of liver disease  Constipation following colorectal  surgery - Status post left hemicolectomy and transverse colostomy 01/03/2022 for ischemic perforation related to medications, hypotension and ischemia; bowel reanastomosis 09/2022 - Constipation persists post-surgery. - Linzess  290 mcg used for management; initially discontinued due to diarrhea but resumed when constipation persisted. - No diarrhea with current Linzess  regimen. - Uses fiber gummies and psyllium; struggles to find optimal balance. - Occasionally requires manual assistance for bowel movements due to hard stools. - Constipation worsens during periods of stress. - No documented history of pelvic floor dysfunction  Gastroesophageal reflux disease and hiatal hernia - GERD and hiatal hernia diagnosed in early twenties. - Esomeprazole  OTC 40 mg daily (takes two 20 mg tabs), timed before meals. - Reflux symptoms persist when lying down, requiring elevated bed. - Regurgitation and unpleasant taste in mouth at night. - No dysphagia or odynophagia  GI Review of Symptoms Significant for constipation, GERD. Otherwise negative.  General Review of Systems  Review of systems is significant for the pertinent positives and negatives as listed per the HPI.  Full ROS is otherwise negative.  Past Medical History   Past Medical History:  Diagnosis Date   ALLERGIC RHINITIS    Allergy    Anginal pain    Blood transfusion without reported diagnosis    Cataract    small right   Cataract Removal Left eye    Cholelithiasis 06/09/2008   gallstones 2009 on ct scan   Complication of anesthesia 2012   went to ICU after bilateral knees , unstable vital signs oct 2012, has surgery since did ok   Coronary artery disease    30 % narrowing lad   Diverticulitis    EPICONDYLITIS, LATERAL    osteoarthritis, previous joint replacements   Fall 10/2018  GERD    Hepatic hemangioma 06/09/2008   Hiatal hernia 03/2000   Hx of adenomatous polyp of colon 06/05/2008   Hx of cardiac catheterization  2008   clean coronarys DrKelly    Hx of chest pain 2003   neg cath remot hx of narrowwing lad in 2003 nl 2008, none in many years   HYPERLIPIDEMIA    IBS (irritable bowel syndrome)    LEUKOPENIA, CHRONIC    Mononucleosis 1963   RAYNAUD'S SYNDROME, HX OF    improved after cardiac meds initiated   Rosacea    facial   Sleep apnea    SLEEP APNEA, OBSTRUCTIVE    cpap, settings automatic settings 11-12   Subacute thyroiditis    UNSPECIFIED ANEMIA      Past Surgical History   Past Surgical History:  Procedure Laterality Date   ABDOMINAL HYSTERECTOMY  1993   bso   ANTERIOR CERVICAL DECOMP/DISCECTOMY FUSION N/A 12/20/2022   Procedure: Cervical four-five Cervical five-six Cervical six-seven Anterior Cervical Decompression Fusion;  Surgeon: Colon Shove, MD;  Location: [facility];  Service: Neurosurgery;  Laterality: N/A;   ANTERIOR FUSION CERVICAL SPINE N/A 12/20/2022   BACK SURGERY  2008   l 3 to l4 l4 to l5   BARTHOLIN GLAND CYST EXCISION Right 09/09/2019   Procedure: EXCISION OF VULVAR MASS TIMES TWO;  Surgeon: Cleotilde Ronal RAMAN, MD;  Location: [facility];  Service: Gynecology;  Laterality: Right;   CARDIAC CATHETERIZATION  04/07/2007   Noncritical coronary artery disease. Contiue medical therapy.   CARDIAC CATHETERIZATION  12/03/2007   Normal LV function. Mild angiographic mitral valve prolapse. Normal coronary arteries.   CARDIOVASCULAR STRESS TEST  11/09/2007   Moderate ischemia in Mid Anterior, Mid Anteroseptal, Apical Anterior, and Apical Septal regions. EKG negative for ischemia.   CATARACT EXTRACTION Right 04/25/2023   CESAREAN SECTION  1985, 1989   COLON RESECTION Left 01/03/2022   Procedure: COLON RESECTION;  Surgeon: Lyndel Deward PARAS, MD;  Location: [facility];  Service: General;  Laterality: Left;   COLOSTOMY  01/03/2022   Procedure: COLOSTOMY;  Surgeon: Lyndel Deward PARAS, MD;  Location: [facility];  Service: General;;   dental implants   11/2002,11/2011,11/2012   dental implants     x 6   DENTAL SURGERY     DILATION AND CURETTAGE OF UTERUS     d and e 1988 and 1990   JOINT REPLACEMENT  09/21/2011   bilateral knee   KNEE ARTHROSCOPY     bilateral   LAPAROTOMY N/A 01/03/2022   Procedure: EXPLORATORY LAPAROTOMY;  Surgeon: Lyndel Deward PARAS, MD;  Location: [facility];  Service: General;  Laterality: N/A;   lumbar surgery fixation with disc replacement  09/2007   Left   NASAL SEPTOPLASTY W/ TURBINOPLASTY  05/2000   NOCTURNAL POLYSOMNOGRAM  12/31/2006   Severe obstructive sleep apnea. AHI-87/hr   REPLACEMENT TOTAL KNEE  2012   bilateral   TONSILLECTOMY  1952   adenoids too   TOTAL HIP ARTHROPLASTY Left 10/21/2013   Procedure: LEFT TOTAL HIP ARTHROPLASTY ANTERIOR APPROACH;  Surgeon: Dempsey LULLA Moan, MD;  Location: [facility];  Service: Orthopedics;  Laterality: Left;   TOTAL HIP ARTHROPLASTY Right 07/16/2014   Procedure: RIGHT TOTAL HIP ARTHROPLASTY ANTERIOR APPROACH;  Surgeon: Dempsey Moan LULLA, MD;  Location: [facility];  Service: Orthopedics;  Laterality: Right;   TRANSTHORACIC ECHOCARDIOGRAM  11/09/2007   EF 58%, LV systolic function normal. Mild aortic root dilation.   WRIST SURGERY Left 04/03/2014   ORIF distal radial  head fracture   XI ROBOTIC ASSISTED COLOSTOMY TAKEDOWN N/A 09/26/2022   Procedure: ROBOTIC OSTOMY REVERSAL;  Surgeon: Lyndel Deward PARAS, MD;  Location: [facility];  Service: General;  Laterality: N/A;     Allergies and Medications   Allergies  Allergen Reactions   Codeine Nausea Only   Crab [Shellfish Allergy] Itching and Nausea And Vomiting    Palm and sole of feet itch- stone crab  Per pt- betadine ok    Current Meds  Medication Sig   acetaminophen  (TYLENOL ) 500 MG tablet Take 1 tablet (500 mg total) by mouth every 6 (six) hours as needed for fever or mild pain.   AMBULATORY NON FORMULARY MEDICATION Medication Name: Reflux Gourmet, take 1 package as needed.   amLODipine  (NORVASC ) 5 MG tablet TAKE  1 TABLET BY MOUTH EVERY DAY   amoxicillin  (AMOXIL ) 500 MG capsule Take 2,000 mg by mouth See admin instructions. Take 2000 mg by mouth 1 hour prior to dental procedures.   aspirin  EC 81 MG tablet Take 81 mg by mouth daily.   atorvastatin  (LIPITOR) 20 MG tablet TAKE 1 TABLET BY MOUTH EVERY DAY   Azelaic Acid  15 % cream Apply 1 application topically 2 (two) times daily as needed (rosacea). After skin is thoroughly washed and patted dry, gently but thoroughly massage a thin film of azelaic acid  cream into the affected area t daily,after shower.   buPROPion  (WELLBUTRIN  XL) 150 MG 24 hr tablet Take 1 tablet (150 mg total) by mouth daily.   calcium  elemental as carbonate (TUMS ULTRA 1000) 400 MG chewable tablet Chew 1,000 mg by mouth daily as needed for heartburn.   carvedilol  (COREG ) 3.125 MG tablet TAKE 1 TABLET BY MOUTH TWICE DAILY WITH MEALS   celecoxib  (CELEBREX ) 200 MG capsule TAKE 1 CAPSULE BY MOUTH ONCE DAILY. MAY ALTERNATE WITH 100MG    desvenlafaxine  (PRISTIQ ) 50 MG 24 hr tablet TAKE 1 TABLET BY MOUTH ONCE DAILY - After weaning off cymbalta    esomeprazole (NEXIUM) 20 MG capsule Take 20 mg by mouth daily at 12 noon.   famotidine  (PEPCID ) 20 MG tablet Take 1 tablet (20 mg total) by mouth at bedtime.   fexofenadine (ALLEGRA) 180 MG tablet Take 180 mg by mouth daily with supper.   metroNIDAZOLE  (METROGEL ) 1 % gel Apply 1 Application topically daily as needed (rosacea).   Multiple Vitamin (MULTIVITAMIN WITH MINERALS) TABS tablet Take 1 tablet by mouth daily. Centrum Women's no Iron    nitroGLYCERIN  (NITROSTAT ) 0.4 MG SL tablet PLACE 1 TABLET UNDER THE TONGUE EVERY 5 MINUTES AS NEEDED FOR CHEST PAIN   psyllium (REGULOID) 0.52 g capsule Take 0.52 g by mouth 2 (two) times daily as needed (bowel).   ramipril  (ALTACE ) 10 MG capsule TAKE 1 CAPSULE BY MOUTH EVERY DAY   valACYclovir  (VALTREX ) 1000 MG tablet TAKE 2 TABLET BY MOUTH Q12 hours X 2 DOSES WITH FEVER BLISTERS   [DISCONTINUED] famotidine  (PEPCID ) 40  MG tablet Take 1 tablet (40 mg total) by mouth daily.   [DISCONTINUED] linaclotide  (LINZESS ) 290 MCG CAPS capsule Take 290 mcg by mouth.    Family History   Family History  Problem Relation Age of Onset   Rheum arthritis Mother    Diabetes Mother    Heart failure Mother    Thrombocytopenia Mother    Sleep apnea Mother    Ulcers Mother        PUD   Deep vein thrombosis Father    Pulmonary embolism Father    Osteoarthritis Father    Atrial  fibrillation Father    Dementia Father    Sleep apnea Father    Sleep apnea Brother    Obesity Brother    Sleep apnea Brother    Obesity Brother    Atrial fibrillation Brother    Colon cancer Maternal Aunt 59   Colon cancer Maternal Aunt 90   Congenital adrenal hyperplasia Grandchild    Pancreatic cancer Other    Uterine cancer Other    Leukemia Other    Inflammatory bowel disease Other        aunt   Esophageal cancer Neg Hx    Rectal cancer Neg Hx    Stomach cancer Neg Hx    Colon polyps Neg Hx      Social History   Social History   Tobacco Use   Smoking status: Never   Smokeless tobacco: Never  Vaping Use   Vaping status: Never Used  Substance Use Topics   Alcohol use: Yes    Alcohol/week: 7.0 standard drinks of alcohol    Types: 7 Glasses of wine per week   Drug use: No   Terri Rodriguez reports that she has never smoked. She has never used smokeless tobacco. She reports current alcohol use of about 7.0 standard drinks of alcohol per week. She reports that she does not use drugs.  Vital Signs and Physical Examination   Vitals:   11/05/24 1019  BP: 110/74  Pulse: 78  SpO2: 98%   Body mass index is 35.61 kg/m. Weight: 214 lb (97.1 kg)  General: Well developed, well nourished, no acute distress Head: Normocephalic and atraumatic Eyes: Sclerae anicteric, EOMI Lungs: Clear throughout to auscultation Heart: Regular rate and rhythm; No murmurs, rubs or bruits Abdomen: Soft, non tender and non distended. No masses,  hepatosplenomegaly or hernias noted. Normal Bowel sounds Rectal: Deferred Musculoskeletal: Symmetrical with no gross deformities    Review of Data  The following data was reviewed at the time of this encounter:  Laboratory Studies      Latest Ref Rng & Units 11/05/2024   11:39 AM 01/05/2024    9:16 AM 04/10/2023   11:03 AM  CBC  WBC 4.0 - 10.5 K/uL 4.0  3.0  3.3   Hemoglobin 12.0 - 15.0 g/dL 87.6  87.2  87.6   Hematocrit 36.0 - 46.0 % 35.2  37.2  37.0   Platelets 150.0 - 400.0 K/uL 225.0  218.0  228     No results found for: LIPASE    Latest Ref Rng & Units 11/05/2024   11:39 AM 07/10/2024    3:05 PM 01/05/2024    9:16 AM  CMP  Glucose 70 - 99 mg/dL 897  891  894   BUN 6 - 23 mg/dL 28  25  21    Creatinine 0.40 - 1.20 mg/dL 9.25  9.25  9.27   Sodium 135 - 145 mEq/L 141  140  142   Potassium 3.5 - 5.1 mEq/L 5.1  4.3  4.1   Chloride 96 - 112 mEq/L 107  105  105   CO2 19 - 32 mEq/L 29  29  29    Calcium  8.4 - 10.5 mg/dL 9.7  9.6  9.2   Total Protein 6.0 - 8.3 g/dL 6.6  6.5  6.5   Total Bilirubin 0.2 - 1.2 mg/dL 0.5  0.4  0.5   Alkaline Phos 39 - 117 U/L 68  72  85   AST 0 - 37 U/L 46  44  40   ALT 0 -  35 U/L 43  38  29    2022: ANA, AMA, ASMA, IgG, Ceruloplasmin, iron  panel, A1AT level normal           HAV and HBV immune  Imaging Studies  CTAP 01/02/2022 1. Free air compatible with bowel perforation. Exact site of bowel perforation is not visualized, but is likely from the colon. There are nonspecific colitis findings involving the splenic flexure to the level of the sigmoid colon. No abscess. 2. Cholelithiasis and gallbladder sludge. 3.  Aortic Atherosclerosis (ICD10-I70.0).  Abdominal ultrasound 02/08/2021 Cholelithiasis. No sonographic signs of cholecystitis or biliary ductal dilatation.   Diffuse hepatocellular disease, and possible hepatic cirrhosis.   Several small hepatic cysts, without significant change compared to prior MRI. No hepatic mass visualized  sonographically.  GI Procedures and Studies  Prior colonoscopies in 1995, 2001, 2007, 2009, 2011, 2015, 2020, 2023  She has a history of a tubular adenoma and sessile serrated adenoma in 2007,  a tubular adenoma in 2009, 5 tubular adenomas 2020, a tubular adenoma and sessile serrated polyp 2023  Clinical Impression  It is my clinical impression that Terri Rodriguez is a 78 y.o. female with;  GERD Hiatal hernia Status post left hemicolectomy and transverse colostomy 01/03/2022 for ischemic perforation related to medications, hypotension and ischemia; bowel reanastomosis 09/2022 Constipation History of colon polyps-tubular adenomas and sessile serrated polyps Family history of colorectal cancer-2 maternal aunts Elevated liver enzymes-possible cirrhosis-suspected ASH vs. MASH Cholelithiasis  Terri Rodriguez returns to the gastroenterology office for follow-up of the gastrointestinal issues as outlined above.  At today's visit we discussed her history of elevated liver enzymes.  Per review of Dr. Eda notes there has been consideration of suspected ASH versus MASH.  Serologic evaluation for other forms of chronic hepatitides in 2022 was negative.  Previous MRI imaging disclosed hepatic hemangioma and cysts but no can concerning lesions.  The possibility of fibrosis and cirrhosis was raised in the past.  There was previously a plan to obtain an abdominal ultrasound with elastography, however, Terri Rodriguez was hospitalized with a spontaneous colon perforation necessitating colostomy and therefore hepatology evaluation was halted.  Laboratory studies at today's visit disclose FIB 4 2.46.  Advised updated laboratory studies and obtaining abdominal ultrasound with elastography as previously it recommended.  In 2023 she incurred a spontaneous colon perforation due to ischemia necessitating a transverse colostomy status post re-anastomosis.  Since her surgery she has encountered issues with constipation.  Symptoms  are currently well-controlled with Linzess  290 mcg p.o. daily and high-fiber diet.  Refill of medication provided today.  She also has a history of GERD and hiatal hernia.  She endorsed breakthrough symptoms despite esomeprazole 40 mg p.o. daily.  Discussed the addition of nighttime H2 blocker.  Other considerations would include twice daily dosing of PPI or the addition of low-dose baclofen.  She has had a history of colon polyps as well as a family history of colorectal cancer in multiple distant relatives.  Based upon age, colorectal polyp surveillance has been ceased.  Plan  Elevated liver enzymes-possible cirrhosis-suspected ASH versus MASH        - Labs today: CBC, CMP, INR, GGT        - Schedule abdominal ultrasound with elastography        - If ultrasound with elastography shows concern for cirrhosis, schedule upper endoscopy for esophageal varices              screening  Constipation        -  Continue Linzess  290 mcg p.o. daily; consider Amitiza, Ibsrela or Motegrity if Linzess  ineffective        - High-fiber diet and maintain adequate hydration        - If symptoms of pelvic floor dysfunction evolve in the future consider referral for pelvic floor physical therapy  GERD/hiatal hernia        - Continue OTC esomeprazole 40 mg p.o. every morning 20 to 30 minutes before meal        - Add Pepcid 20 mg p.o. nightly; can be further dose optimized to 40 mg if needed        - Consider twice daily PPI or low-dose baclofen in the future if symptoms are persistent             - GERD diet and lifestyle modification     History of colon polyps and family history of colorectal cancer        - No longer in surveillance due to age  Planned Follow Up 6 months  The patient or caregiver verbalized understanding of the material covered, with no barriers to understanding. All questions were answered. Patient or caregiver is agreeable with the plan outlined above.    It was a pleasure to see Terri Rodriguez.   If you have any questions or concerns regarding this evaluation, do not hesitate to contact me.  Inocente Hausen, MD [facility]   I spent total of 45 minutes in both face-to-face (25 minutes interview) and non-face-to-face (20 minutes chart review, care coordination, documentation)  activities, excluding procedures performed, for the visit on the date of this encounter.

## 2024-11-05 ENCOUNTER — Ambulatory Visit: Admitting: Pediatrics

## 2024-11-05 ENCOUNTER — Encounter: Payer: Self-pay | Admitting: Pediatrics

## 2024-11-05 ENCOUNTER — Other Ambulatory Visit (INDEPENDENT_AMBULATORY_CARE_PROVIDER_SITE_OTHER)

## 2024-11-05 VITALS — BP 110/74 | HR 78 | Ht 65.0 in | Wt 214.0 lb

## 2024-11-05 DIAGNOSIS — K7581 Nonalcoholic steatohepatitis (NASH): Secondary | ICD-10-CM

## 2024-11-05 DIAGNOSIS — K5900 Constipation, unspecified: Secondary | ICD-10-CM

## 2024-11-05 DIAGNOSIS — K219 Gastro-esophageal reflux disease without esophagitis: Secondary | ICD-10-CM

## 2024-11-05 LAB — CBC WITH DIFFERENTIAL/PLATELET
Basophils Absolute: 0 K/uL (ref 0.0–0.1)
Basophils Relative: 0.6 % (ref 0.0–3.0)
Eosinophils Absolute: 0.1 K/uL (ref 0.0–0.7)
Eosinophils Relative: 3.3 % (ref 0.0–5.0)
HCT: 35.2 % — ABNORMAL LOW (ref 36.0–46.0)
Hemoglobin: 12.3 g/dL (ref 12.0–15.0)
Lymphocytes Relative: 26.8 % (ref 12.0–46.0)
Lymphs Abs: 1.1 K/uL (ref 0.7–4.0)
MCHC: 35 g/dL (ref 30.0–36.0)
MCV: 95.6 fl (ref 78.0–100.0)
Monocytes Absolute: 0.3 K/uL (ref 0.1–1.0)
Monocytes Relative: 7 % (ref 3.0–12.0)
Neutro Abs: 2.5 K/uL (ref 1.4–7.7)
Neutrophils Relative %: 62.3 % (ref 43.0–77.0)
Platelets: 225 K/uL (ref 150.0–400.0)
RBC: 3.68 Mil/uL — ABNORMAL LOW (ref 3.87–5.11)
RDW: 14.4 % (ref 11.5–15.5)
WBC: 4 K/uL (ref 4.0–10.5)

## 2024-11-05 LAB — GAMMA GT: GGT: 15 U/L (ref 7–51)

## 2024-11-05 LAB — COMPREHENSIVE METABOLIC PANEL WITH GFR
ALT: 43 U/L — ABNORMAL HIGH (ref 0–35)
AST: 46 U/L — ABNORMAL HIGH (ref 0–37)
Albumin: 4.5 g/dL (ref 3.5–5.2)
Alkaline Phosphatase: 68 U/L (ref 39–117)
BUN: 28 mg/dL — ABNORMAL HIGH (ref 6–23)
CO2: 29 meq/L (ref 19–32)
Calcium: 9.7 mg/dL (ref 8.4–10.5)
Chloride: 107 meq/L (ref 96–112)
Creatinine, Ser: 0.74 mg/dL (ref 0.40–1.20)
GFR: 76.77 mL/min (ref >60.00)
Glucose, Bld: 102 mg/dL — ABNORMAL HIGH (ref 70–99)
Potassium: 5.1 meq/L (ref 3.5–5.1)
Sodium: 141 meq/L (ref 135–145)
Total Bilirubin: 0.5 mg/dL (ref 0.2–1.2)
Total Protein: 6.6 g/dL (ref 6.0–8.3)

## 2024-11-05 LAB — PROTIME-INR
INR: 1.1 ratio — ABNORMAL HIGH (ref 0.8–1.0)
Prothrombin Time: 11.9 s (ref 9.6–13.1)

## 2024-11-05 MED ORDER — FAMOTIDINE 20 MG PO TABS: 20.0000 mg | ORAL_TABLET | Freq: Every day | ORAL | 3 refills | Status: AC

## 2024-11-05 MED ORDER — AMBULATORY NON FORMULARY MEDICATION: Status: AC

## 2024-11-05 MED ORDER — LINACLOTIDE 290 MCG PO CAPS: 290.0000 ug | ORAL_CAPSULE | Freq: Every day | ORAL | 3 refills | Status: DC

## 2024-11-05 MED ORDER — FAMOTIDINE 40 MG PO TABS: 40.0000 mg | ORAL_TABLET | Freq: Every day | ORAL | 3 refills | Status: DC

## 2024-11-05 NOTE — Patient Instructions (Addendum)
 Your provider has requested that you go to the basement level for lab work before leaving today. Press B on the elevator. The lab is located at the first door on the left as you exit the elevator.  Due to recent changes in healthcare laws, you may see the results of your imaging and laboratory studies on MyChart before your provider has had a chance to review them.  We understand that in some cases there may be results that are confusing or concerning to you. Not all laboratory results come back in the same time frame and the provider may be waiting for multiple results in order to interpret others.  Please give us  48 hours in order for your provider to thoroughly review all the results before contacting the office for clarification of your results.   You have been scheduled for an abdominal ultrasound w/ elastography at [facility] (1st floor of hospital) on Tuesday, 11/12/24 at 8:00 am. Please arrive 15 minutes prior to your appointment for registration. Make certain not to have anything to eat or drink 6 hours prior to your appointment. Should you need to reschedule your appointment, please contact radiology at [phone]. This test typically takes about 30 minutes to perform.   We have sent the following medications to your pharmacy for you to pick up at your convenience: Linzess  290 mcg, take 1 tablet daily before meal.  Pepcid 20 mg, take 1 tablet at night.  Thank you for entrusting me with your care and for choosing [facility], Dr. Inocente Hausen  _______________________________________________________  If your blood pressure at your visit was 140/90 or greater, please contact your primary care physician to follow up on this.  _______________________________________________________  If you are age 79 or older, your body mass index should be between 23-30. Your Body mass index is 35.61 kg/m. If this is out of the aforementioned range listed, please consider follow up  with your Primary Care Provider.  If you are age 5 or younger, your body mass index should be between 19-25. Your Body mass index is 35.61 kg/m. If this is out of the aformentioned range listed, please consider follow up with your Primary Care Provider.   ________________________________________________________  The [facility] providers would like to encourage you to use MYCHART to communicate with providers for non-urgent requests or questions.  Due to long hold times on the telephone, sending your provider a message by [facility] may be a faster and more efficient way to get a response.  Please allow 48 business hours for a response.  Please remember that this is for non-urgent requests.  _______________________________________________________  [facility] is using a team-based approach to care.  Your team is made up of your doctor and two to three APPS. Our APPS (Nurse Practitioners and Physician Assistants) work with your physician to ensure care continuity for you. They are fully qualified to address your health concerns and develop a treatment plan. They communicate directly with your gastroenterologist to care for you. Seeing the Advanced Practice Practitioners on your physician's team can help you by facilitating care more promptly, often allowing for earlier appointments, access to diagnostic testing, procedures, and other specialty referrals.

## 2024-11-06 ENCOUNTER — Ambulatory Visit (HOSPITAL_BASED_OUTPATIENT_CLINIC_OR_DEPARTMENT_OTHER): Admitting: Physical Therapy

## 2024-11-06 ENCOUNTER — Encounter (HOSPITAL_BASED_OUTPATIENT_CLINIC_OR_DEPARTMENT_OTHER): Payer: Self-pay | Admitting: Physical Therapy

## 2024-11-06 DIAGNOSIS — M4726 Other spondylosis with radiculopathy, lumbar region: Secondary | ICD-10-CM | POA: Diagnosis not present

## 2024-11-06 DIAGNOSIS — M48061 Spinal stenosis, lumbar region without neurogenic claudication: Secondary | ICD-10-CM | POA: Diagnosis not present

## 2024-11-06 DIAGNOSIS — M6281 Muscle weakness (generalized): Secondary | ICD-10-CM | POA: Insufficient documentation

## 2024-11-06 DIAGNOSIS — M2550 Pain in unspecified joint: Secondary | ICD-10-CM | POA: Insufficient documentation

## 2024-11-06 DIAGNOSIS — M47816 Spondylosis without myelopathy or radiculopathy, lumbar region: Secondary | ICD-10-CM | POA: Diagnosis not present

## 2024-11-06 DIAGNOSIS — M5459 Other low back pain: Secondary | ICD-10-CM | POA: Diagnosis present

## 2024-11-06 NOTE — Therapy (Signed)
 OUTPATIENT PHYSICAL THERAPY THORACOLUMBAR TREATMENT   Patient Name: Terri Rodriguez MRN: [id number] DOB:10/26/1946, 78 y.o., female Today's Date: 11/06/2024  END OF SESSION:  PT End of Session - 11/06/24 1408     Visit Number 2    Date for Recertification  01/03/25    Authorization Type [employer]    PT Start Time 1400    PT Stop Time 1440    PT Time Calculation (min) 40 min    Activity Tolerance Patient tolerated treatment well    Behavior During Therapy [facility] for tasks assessed/performed           Past Medical History:  Diagnosis Date   ALLERGIC RHINITIS    Allergy    Anginal pain    Blood transfusion without reported diagnosis    Cataract    small right   Cataract Removal Left eye    Cholelithiasis 06/09/2008   gallstones 2009 on ct scan   Complication of anesthesia 2012   went to ICU after bilateral knees , unstable vital signs oct 2012, has surgery since did ok   Coronary artery disease    30 % narrowing lad   Diverticulitis    EPICONDYLITIS, LATERAL    osteoarthritis, previous joint replacements   Fall 10/2018   GERD    Hepatic hemangioma 06/09/2008   Hiatal hernia 03/2000   Hx of adenomatous polyp of colon 06/05/2008   Hx of cardiac catheterization 2008   clean coronarys DrKelly    Hx of chest pain 2003   neg cath remot hx of narrowwing lad in 2003 nl 2008, none in many years   HYPERLIPIDEMIA    IBS (irritable bowel syndrome)    LEUKOPENIA, CHRONIC    Mononucleosis 1963   RAYNAUD'S SYNDROME, HX OF    improved after cardiac meds initiated   Rosacea    facial   Sleep apnea    SLEEP APNEA, OBSTRUCTIVE    cpap, settings automatic settings 11-12   Subacute thyroiditis    UNSPECIFIED ANEMIA    Past Surgical History:  Procedure Laterality Date   ABDOMINAL HYSTERECTOMY  1993   bso   ANTERIOR CERVICAL DECOMP/DISCECTOMY FUSION N/A 12/20/2022   Procedure: Cervical four-five Cervical five-six Cervical six-seven Anterior Cervical Decompression Fusion;   Surgeon: Colon Shove, MD;  Location: [facility] OR;  Service: Neurosurgery;  Laterality: N/A;   ANTERIOR FUSION CERVICAL SPINE N/A 12/20/2022   BACK SURGERY  2008   l 3 to l4 l4 to l5   BARTHOLIN GLAND CYST EXCISION Right 09/09/2019   Procedure: EXCISION OF VULVAR MASS TIMES TWO;  Surgeon: Cleotilde Ronal RAMAN, MD;  Location: [facility];  Service: Gynecology;  Laterality: Right;   CARDIAC CATHETERIZATION  04/07/2007   Noncritical coronary artery disease. Contiue medical therapy.   CARDIAC CATHETERIZATION  12/03/2007   Normal LV function. Mild angiographic mitral valve prolapse. Normal coronary arteries.   CARDIOVASCULAR STRESS TEST  11/09/2007   Moderate ischemia in Mid Anterior, Mid Anteroseptal, Apical Anterior, and Apical Septal regions. EKG negative for ischemia.   CATARACT EXTRACTION Right 04/25/2023   CESAREAN SECTION  1985, 1989   COLON RESECTION Left 01/03/2022   Procedure: COLON RESECTION;  Surgeon: Lyndel Deward PARAS, MD;  Location: [facility];  Service: General;  Laterality: Left;   COLOSTOMY  01/03/2022   Procedure: COLOSTOMY;  Surgeon: Lyndel Deward PARAS, MD;  Location: [facility];  Service: General;;   dental implants  11/2002,11/2011,11/2012   dental implants     x 6   DENTAL  SURGERY     DILATION AND CURETTAGE OF UTERUS     d and e 1988 and 1990   JOINT REPLACEMENT  09/21/2011   bilateral knee   KNEE ARTHROSCOPY     bilateral   LAPAROTOMY N/A 01/03/2022   Procedure: EXPLORATORY LAPAROTOMY;  Surgeon: Lyndel Deward PARAS, MD;  Location: [facility];  Service: General;  Laterality: N/A;   lumbar surgery fixation with disc replacement  09/2007   Left   NASAL SEPTOPLASTY W/ TURBINOPLASTY  05/2000   NOCTURNAL POLYSOMNOGRAM  12/31/2006   Severe obstructive sleep apnea. AHI-87/hr   REPLACEMENT TOTAL KNEE  2012   bilateral   TONSILLECTOMY  1952   adenoids too   TOTAL HIP ARTHROPLASTY Left 10/21/2013   Procedure: LEFT TOTAL HIP ARTHROPLASTY ANTERIOR APPROACH;  Surgeon: Dempsey LULLA Moan, MD;  Location: [facility];  Service: Orthopedics;  Laterality: Left;   TOTAL HIP ARTHROPLASTY Right 07/16/2014   Procedure: RIGHT TOTAL HIP ARTHROPLASTY ANTERIOR APPROACH;  Surgeon: Dempsey Moan LULLA, MD;  Location: [facility];  Service: Orthopedics;  Laterality: Right;   TRANSTHORACIC ECHOCARDIOGRAM  11/09/2007   EF 58%, LV systolic function normal. Mild aortic root dilation.   WRIST SURGERY Left 04/03/2014   ORIF distal radial head fracture   XI ROBOTIC ASSISTED COLOSTOMY TAKEDOWN N/A 09/26/2022   Procedure: ROBOTIC OSTOMY REVERSAL;  Surgeon: Lyndel Deward PARAS, MD;  Location: [facility];  Service: General;  Laterality: N/A;   Patient Active Problem List   Diagnosis Date Noted   Cervical myelopathy (HCC) 12/20/2022   History of creation of ostomy (HCC) 09/26/2022   Colostomy in place (HCC) 01/09/2022   IBS (irritable bowel syndrome) 01/05/2022   Physical deconditioning 01/05/2022   Perforation of colon s/p Hartmann colectomy/colostomy 01/03/2022 01/02/2022   Lumbar post-laminectomy syndrome 10/07/2021   Cervical spondylosis without myelopathy 10/07/2021   Primary osteoarthritis of both feet 06/18/2017   Status post bilateral total hip replacement 06/18/2017   Status post total knee replacement, bilateral 06/18/2017   Degenerative disc disease, lumbar status post fusion 06/18/2017   Degenerative disc disease, cervical 06/18/2017   History of cholelithiasis 06/18/2017   Other chronic pain 02/01/2017   Visit for preventive health examination 01/21/2015   Medicare annual wellness visit, subsequent 01/21/2015   BMI 40.0-44.9, adult (HCC) 01/21/2015   Hyperlipidemia LDL goal <70 01/12/2015   Morbid obesity (HCC) 01/12/2015   Hyperglycemia 01/10/2014   Mild CAD 01/06/2014   Medication monitoring encounter 09/22/2012   Hx of cardiac catheterization    Adenomatous polyp of colon 06/14/2012   ROSACEA 04/26/2010   Obstructive sleep apnea 09/09/2009   Normocytic anemia 12/15/2008    LEUKOPENIA, CHRONIC 01/08/2008   Elevated lipids 08/17/2007   ALLERGIC RHINITIS 08/17/2007   GERD 08/17/2007   Primary osteoarthritis of both hands 08/17/2007    PCP: Apolinar Eastern MD  REFERRING PROVIDER: Apolinar Eastern MD  REFERRING DIAG:  M15.9 (ICD-10-CM) - Osteoarthritis of multiple joints, unspecified osteoarthritis type  R53.81 (ICD-10-CM) - Physical deconditioning  M51.369 (ICD-10-CM) - Degeneration of intervertebral disc of lumbar region, unspecified whether pain present  M25.50 (ICD-10-CM) - Multiple joint pain    Rationale for Evaluation and Treatment: Rehabilitation  THERAPY DIAG:  Other low back pain  Multiple joint pain  Muscle weakness (generalized)  ONSET DATE: chronic  SUBJECTIVE:  SUBJECTIVE STATEMENT: Pain slightly high had 2 MRIs today right now 3/10. Hurt 10/10 getting up off the table'   Initial Subjective Got an injection by Dr Colon a few weeks ago in my left LB and it helped, stopped the pain down my left leg. There has been one thing after another.  My whole body hurts.  I am losing my strength.  I was coming up front at [facility] to exercises 1 x week.    PERTINENT HISTORY:  ACDF 12/20/22, Long history of medical and surgical diagnoses. Memory issues, LBP since surgery 2008, fibromyalgia, B hip and knee replacements, R achilles tear 2022   PAIN:  Are you having pain? Yes: NPRS scale: current 3-4/10  Pain location: LB, hips   Pain description: ache Aggravating factors: not having meds Relieving factors: resting, taking meds  PRECAUTIONS: Fall  RED FLAGS: None   WEIGHT BEARING RESTRICTIONS: No  FALLS:  Has patient fallen in last 6 months? No  LIVING ENVIRONMENT: Lives with: lives with their spouse Lives in: House/apartment Stairs: Yes: Internal: 16  steps; on right going up Has following equipment at home: Vannie - 4 wheeled  OCCUPATION: retired physician  PLOF: Requires assistive device for independence and Needs assistance with ADLs  PATIENT GOALS: I don't know,  My husband wanted me to come, build up some ms strength  NEXT MD VISIT: as needed  OBJECTIVE:  Note: Objective measures were completed at Evaluation unless otherwise noted.  DIAGNOSTIC FINDINGS:  N/a  PATIENT SURVEYS:  ODI: 30/50=60%  COGNITION: Overall cognitive status: History of cognitive impairments - at baseline     SENSATION: WFL   POSTURE: rounded shoulders, forward head, and decreased lumbar lordosis  PALPATION: Slight TTP about lumbar paraspinals  LUMBAR ROM:   AROM eval  Flexion FT to below patella  Extension Neutral  Right lateral flexion 75% limited P!  Left lateral flexion 50% Limited P!  Right rotation   Left rotation    (Blank rows = not tested)  LOWER EXTREMITY Strength:     MMT Right eval Left eval  Hip flexion 4 4  Hip extension    Hip abduction 5 5  Hip adduction 5 5  Hip internal rotation    Hip external rotation    Knee flexion    Knee extension 4+ 4+  Ankle dorsiflexion 5 4-  Ankle plantarflexion    Ankle inversion    Ankle eversion     (Blank rows = not tested)  LOWER EXTREMITY ROM:    WFL  LUMBAR SPECIAL TESTS:  Slump test: Negative  FUNCTIONAL TESTS:  Item Test date: 10/23/24 Date:  Date:   Sitting to standing 3. able to stand independently using hands Insert SmartPhrase OPRCBERGREEVAL Insert SmartPhrase OPRCBERGREEVAL  2. Standing unsupported 4. able to stand safely for 2 minutes    3. Sitting with back unsupported, feet supported 4. able to sit safely and securely for 2 minutes    4. Standing to sitting 2. uses back of legs against chair to control descent    5. Pivot transfer  3. able to transfer safely with definite need of hands    6. Standing unsupported with eyes closed 3. able to stand 10  seconds with supervision    7. Standing unsupported with feet together 2. able to place feet together independently but unable to hold for 30 seconds    8. Reaching forward with outstretched arms while standing 2. can reach forward 5 cm (2 inches)    9. Pick  up object from the floor from standing 3. able to pick up slipper but needs supervision    10. Turning to look behind over left and right shoulders while standing 2. turns sideways but only maintains balance    11. Turn 360 degrees 0. needs assistance while turning    12. Place alternate foot on step or stool while standing unsupported 0. needs assistance to keep from falling/unable to try    13. Standing unsupported one foot in front 0. loses balance while stepping or standing    14. Standing on one leg 0. unable to try of needs assist to prevent fall      Total Score 28/56 Total Score:    Total Score:     TUG 34.95   GAIT: Distance walked: 400 ft Assistive device utilized: Walker - 4 wheeled Level of assistance: SBA Comments: using rollator: shortened step length, reduced cadence  TREATMENT  [facility] Adult PT Treatment:                                                DATE: 11/06/24 Pt seen for aquatic therapy today.  Treatment took place in water 3.5-4.75 ft in depth at the [facility] pool. Temp of water was 91.  Pt entered/exited the pool via stairs using sep to pattern and cga with hand rail.  *Intro to setting *walking forward, back and side stepping in 3.6-4 ft with ue support of barbell  - squatted rest period *L stretch *seated on lift: cycling; hip add/abd; LAQ  - seated rest period *Ue support on wall: toe raises; heel raises; hip add/abd; hip extension; relaxed squats 2 x 5  Pt requires the buoyancy and hydrostatic pressure of water for support, and to offload joints by unweighting joint load by at least 50 % in navel deep water and by at least 75-80% in chest to neck deep water.  Viscosity of the water is needed  for resistance of strengthening. Water current perturbations provides challenge to standing balance requiring increased core activation.                                                                                                                   PATIENT EDUCATION:  Education details: Discussed eval findings, rehab rationale, aquatic program progression/POC and pools in area. Patient is in agreement  Person educated: Patient Education method: Explanation Education comprehension: verbalized understanding  HOME EXERCISE PROGRAM: TBA  ASSESSMENT:  CLINICAL IMPRESSION: Pt demonstrates safety and independence in aquatic setting with therapist instructing from deck. She is confident in setting, moving throughout all depths easily.  Pt is directed through various movement patterns and trials in both sitting and standing positions.   Pt is provided VC and demonstration throughout session for execution of exercises while monitoring toleration.She requires multiple rest periods as her LBP slightly flared from MRI completed earlier.  She otherwise has  good toleration to setting.  Reduction of pain as session progresses.  Does need xtra time ascending stairs as gravity and weight of water/body weight increases back pain  Goals are ongoing.    Initial Impression Patient is a 78 y.o. f who was seen today for physical therapy evaluation and treatment for Oa multiple joints with LBP being most limiting. Pt is well known to this clinic as she has been seen for other episodes. She is a member here at [facility] and reports coming and using some exercise machines weekly. Patient presents with pain deficits in LB primarily but also hips and knees limiting her (lumbar) ROM, strength, endurance, activity tolerance, gait, balance, and functional mobility with ADL's. She uses rollator at all times in and out of home. Has not had any recent falls. She does have mild cognitive dysfunction. She will benefit from an  episode of skilled aquatic PT to improve all areas of deficits, objective impairments as listed below and manage chronic pain.   OBJECTIVE IMPAIRMENTS: Abnormal gait, decreased activity tolerance, decreased balance, decreased coordination, decreased mobility, difficulty walking, decreased ROM, decreased strength, postural dysfunction, and pain.   ACTIVITY LIMITATIONS: carrying, lifting, bending, sitting, standing, squatting, stairs, transfers, locomotion level, and caring for others  PARTICIPATION LIMITATIONS: meal prep, cleaning, laundry, driving, shopping, community activity, and yard work  PERSONAL FACTORS: Time since onset of injury/illness/exacerbation are also affecting patient's functional outcome.   REHAB POTENTIAL: Fair chronicity of condition  CLINICAL DECISION MAKING: Evolving/moderate complexity  EVALUATION COMPLEXITY: Moderate   GOALS: Goals reviewed with patient? Yes  SHORT TERM GOALS: Target date: 11/29/24  Pt will tolerate full aquatic sessions consistently without increase in pain and with improving function to demonstrate good toleration and effectiveness of intervention.  Baseline: Goal status: INITIAL  2.  Pt will tolerate walking to and from setting and engaging in aquatic therapy session without excessive fatigue or increase in pain to demonstrate improved toleration to activity.  Baseline:  Goal status: INITIAL     LONG TERM GOALS: Target date: 01/03/25  Pt to improve on ODI by 13% to demonstrate statistically significant Improvement in function. (MCID 13-15%) Baseline: 30/50=60% Goal status: INITIAL  2.  Pt will improve strength in all areas to 5/5 to demonstrate improved overall physical function Baseline:  Goal status: INITIAL  3.  Pt will improve on Berg balance test to >/= 38/56 to demonstrate a decrease in fall risk. (MDC 7) Baseline: 28/56 Goal status: INITIAL  4.  Pt will improve on Tug test to <or= 22s to demonstrate improvement in lower  extremity function, mobility and decreased fall risk. (Geriatric rehab avg @ DC= 21.2) Baseline: 34.95 Goal status: INITIAL  5.  Pt will report decrease in pain by at least 50% for improved toleration to activity/quality of life and to demonstrate improved management of pain. Baseline: see chart Goal status: INITIAL   PLAN:  PT FREQUENCY: 1x/week  PT DURATION: 10 weeks  likely 8 visits  PLANNED INTERVENTIONS: 97164- PT Re-evaluation, 97750- Physical Performance Testing, 97110-Therapeutic exercises, 97530- Therapeutic activity, 97112- Neuromuscular re-education, 97535- Self Care, 02859- Manual therapy, [phone]- Gait training, [phone]- Aquatic Therapy, [phone]- Ionotophoresis 4mg /ml Dexamethasone , 79439 (1-2 muscles), 20561 (3+ muscles)- Dry Needling, Patient/Family education, Balance training, Stair training, Taping, Joint mobilization, DME instructions, Cryotherapy, and Moist heat.  PLAN FOR NEXT SESSION: aquatic: general strengthening, ROM, stretching; gait, pain management, balance retraining   Ronal Foots) Tyquisha Sharps MPT 11/06/24 2:10 PM [facility] [address] [place], [place], [address]  Phone: [phone]   Fax:  [phone]

## 2024-11-07 ENCOUNTER — Ambulatory Visit: Payer: Self-pay | Admitting: Pediatrics

## 2024-11-12 ENCOUNTER — Ambulatory Visit (HOSPITAL_COMMUNITY)

## 2024-11-13 ENCOUNTER — Ambulatory Visit (HOSPITAL_BASED_OUTPATIENT_CLINIC_OR_DEPARTMENT_OTHER): Admitting: Physical Therapy

## 2024-11-19 ENCOUNTER — Encounter (HOSPITAL_BASED_OUTPATIENT_CLINIC_OR_DEPARTMENT_OTHER): Payer: Self-pay | Admitting: Physical Therapy

## 2024-11-19 ENCOUNTER — Ambulatory Visit (HOSPITAL_BASED_OUTPATIENT_CLINIC_OR_DEPARTMENT_OTHER): Admitting: Physical Therapy

## 2024-11-19 DIAGNOSIS — M5459 Other low back pain: Secondary | ICD-10-CM | POA: Diagnosis not present

## 2024-11-19 DIAGNOSIS — M6281 Muscle weakness (generalized): Secondary | ICD-10-CM

## 2024-11-19 DIAGNOSIS — M2550 Pain in unspecified joint: Secondary | ICD-10-CM

## 2024-11-19 NOTE — Therapy (Signed)
 OUTPATIENT PHYSICAL THERAPY THORACOLUMBAR TREATMENT   Patient Name: Terri Rodriguez MRN: [id number] DOB:October 06, 1946, 78 y.o., female Today's Date: 11/19/2024  END OF SESSION:  PT End of Session - 11/19/24 1438     Visit Number 3    Date for Recertification  01/03/25    Authorization Type [employer]    PT Start Time 1445    PT Stop Time 1525    PT Time Calculation (min) 40 min    Behavior During Therapy [facility] for tasks assessed/performed           Past Medical History:  Diagnosis Date   ALLERGIC RHINITIS    Allergy    Anginal pain    Blood transfusion without reported diagnosis    Cataract    small right   Cataract Removal Left eye    Cholelithiasis 06/09/2008   gallstones 2009 on ct scan   Complication of anesthesia 2012   went to ICU after bilateral knees , unstable vital signs oct 2012, has surgery since did ok   Coronary artery disease    30 % narrowing lad   Diverticulitis    EPICONDYLITIS, LATERAL    osteoarthritis, previous joint replacements   Fall 10/2018   GERD    Hepatic hemangioma 06/09/2008   Hiatal hernia 03/2000   Hx of adenomatous polyp of colon 06/05/2008   Hx of cardiac catheterization 2008   clean coronarys DrKelly    Hx of chest pain 2003   neg cath remot hx of narrowwing lad in 2003 nl 2008, none in many years   HYPERLIPIDEMIA    IBS (irritable bowel syndrome)    LEUKOPENIA, CHRONIC    Mononucleosis 1963   RAYNAUD'S SYNDROME, HX OF    improved after cardiac meds initiated   Rosacea    facial   Sleep apnea    SLEEP APNEA, OBSTRUCTIVE    cpap, settings automatic settings 11-12   Subacute thyroiditis    UNSPECIFIED ANEMIA    Past Surgical History:  Procedure Laterality Date   ABDOMINAL HYSTERECTOMY  1993   bso   ANTERIOR CERVICAL DECOMP/DISCECTOMY FUSION N/A 12/20/2022   Procedure: Cervical four-five Cervical five-six Cervical six-seven Anterior Cervical Decompression Fusion;  Surgeon: Colon Shove, MD;  Location: [facility];  Service:  Neurosurgery;  Laterality: N/A;   ANTERIOR FUSION CERVICAL SPINE N/A 12/20/2022   BACK SURGERY  2008   l 3 to l4 l4 to l5   BARTHOLIN GLAND CYST EXCISION Right 09/09/2019   Procedure: EXCISION OF VULVAR MASS TIMES TWO;  Surgeon: Cleotilde Ronal RAMAN, MD;  Location: [facility];  Service: Gynecology;  Laterality: Right;   CARDIAC CATHETERIZATION  04/07/2007   Noncritical coronary artery disease. Contiue medical therapy.   CARDIAC CATHETERIZATION  12/03/2007   Normal LV function. Mild angiographic mitral valve prolapse. Normal coronary arteries.   CARDIOVASCULAR STRESS TEST  11/09/2007   Moderate ischemia in Mid Anterior, Mid Anteroseptal, Apical Anterior, and Apical Septal regions. EKG negative for ischemia.   CATARACT EXTRACTION Right 04/25/2023   CESAREAN SECTION  1985, 1989   COLON RESECTION Left 01/03/2022   Procedure: COLON RESECTION;  Surgeon: Lyndel Deward PARAS, MD;  Location: [facility];  Service: General;  Laterality: Left;   COLOSTOMY  01/03/2022   Procedure: COLOSTOMY;  Surgeon: Lyndel Deward PARAS, MD;  Location: [facility];  Service: General;;   dental implants  11/2002,11/2011,11/2012   dental implants     x 6   DENTAL SURGERY     DILATION AND CURETTAGE OF  UTERUS     d and e 1988 and 1990   JOINT REPLACEMENT  09/21/2011   bilateral knee   KNEE ARTHROSCOPY     bilateral   LAPAROTOMY N/A 01/03/2022   Procedure: EXPLORATORY LAPAROTOMY;  Surgeon: Lyndel Deward PARAS, MD;  Location: [facility];  Service: General;  Laterality: N/A;   lumbar surgery fixation with disc replacement  09/2007   Left   NASAL SEPTOPLASTY W/ TURBINOPLASTY  05/2000   NOCTURNAL POLYSOMNOGRAM  12/31/2006   Severe obstructive sleep apnea. AHI-87/hr   REPLACEMENT TOTAL KNEE  2012   bilateral   TONSILLECTOMY  1952   adenoids too   TOTAL HIP ARTHROPLASTY Left 10/21/2013   Procedure: LEFT TOTAL HIP ARTHROPLASTY ANTERIOR APPROACH;  Surgeon: Dempsey LULLA Moan, MD;  Location: [facility];  Service: Orthopedics;   Laterality: Left;   TOTAL HIP ARTHROPLASTY Right 07/16/2014   Procedure: RIGHT TOTAL HIP ARTHROPLASTY ANTERIOR APPROACH;  Surgeon: Dempsey Moan LULLA, MD;  Location: [facility];  Service: Orthopedics;  Laterality: Right;   TRANSTHORACIC ECHOCARDIOGRAM  11/09/2007   EF 58%, LV systolic function normal. Mild aortic root dilation.   WRIST SURGERY Left 04/03/2014   ORIF distal radial head fracture   XI ROBOTIC ASSISTED COLOSTOMY TAKEDOWN N/A 09/26/2022   Procedure: ROBOTIC OSTOMY REVERSAL;  Surgeon: Lyndel Deward PARAS, MD;  Location: [facility];  Service: General;  Laterality: N/A;   Patient Active Problem List   Diagnosis Date Noted   Cervical myelopathy (HCC) 12/20/2022   History of creation of ostomy (HCC) 09/26/2022   Colostomy in place (HCC) 01/09/2022   IBS (irritable bowel syndrome) 01/05/2022   Physical deconditioning 01/05/2022   Perforation of colon s/p Hartmann colectomy/colostomy 01/03/2022 01/02/2022   Lumbar post-laminectomy syndrome 10/07/2021   Cervical spondylosis without myelopathy 10/07/2021   Primary osteoarthritis of both feet 06/18/2017   Status post bilateral total hip replacement 06/18/2017   Status post total knee replacement, bilateral 06/18/2017   Degenerative disc disease, lumbar status post fusion 06/18/2017   Degenerative disc disease, cervical 06/18/2017   History of cholelithiasis 06/18/2017   Other chronic pain 02/01/2017   Visit for preventive health examination 01/21/2015   Medicare annual wellness visit, subsequent 01/21/2015   BMI 40.0-44.9, adult (HCC) 01/21/2015   Hyperlipidemia LDL goal <70 01/12/2015   Morbid obesity (HCC) 01/12/2015   Hyperglycemia 01/10/2014   Mild CAD 01/06/2014   Medication monitoring encounter 09/22/2012   Hx of cardiac catheterization    Adenomatous polyp of colon 06/14/2012   ROSACEA 04/26/2010   Obstructive sleep apnea 09/09/2009   Normocytic anemia 12/15/2008   LEUKOPENIA, CHRONIC 01/08/2008   Elevated lipids 08/17/2007    ALLERGIC RHINITIS 08/17/2007   GERD 08/17/2007   Primary osteoarthritis of both hands 08/17/2007    PCP: Apolinar Eastern MD  REFERRING PROVIDER: Apolinar Eastern MD  REFERRING DIAG:  M15.9 (ICD-10-CM) - Osteoarthritis of multiple joints, unspecified osteoarthritis type  R53.81 (ICD-10-CM) - Physical deconditioning  M51.369 (ICD-10-CM) - Degeneration of intervertebral disc of lumbar region, unspecified whether pain present  M25.50 (ICD-10-CM) - Multiple joint pain    Rationale for Evaluation and Treatment: Rehabilitation  THERAPY DIAG:  Other low back pain  Multiple joint pain  Muscle weakness (generalized)  ONSET DATE: chronic  SUBJECTIVE:  SUBJECTIVE STATEMENT: Pt reports no new changes since last session.    Initial Subjective Got an injection by Dr Colon a few weeks ago in my left LB and it helped, stopped the pain down my left leg. There has been one thing after another.  My whole body hurts.  I am losing my strength.  I was coming up front at [facility] to exercises 1 x week.    PERTINENT HISTORY:  ACDF 12/20/22, Long history of medical and surgical diagnoses. Memory issues, LBP since surgery 2008, fibromyalgia, B hip and knee replacements, R achilles tear 2022   PAIN:  Are you having pain? Yes: NPRS scale: current 5/10 ok-ish  Pain location: LB, hips   Pain description: ache Aggravating factors: not having meds Relieving factors: resting, taking meds  PRECAUTIONS: Fall  RED FLAGS: None   WEIGHT BEARING RESTRICTIONS: No  FALLS:  Has patient fallen in last 6 months? No  LIVING ENVIRONMENT: Lives with: lives with their spouse Lives in: House/apartment Stairs: Yes: Internal: 16 steps; on right going up Has following equipment at home: Vannie - 4 wheeled  OCCUPATION: retired  physician (internal medicine)  PLOF: Requires assistive device for independence and Needs assistance with ADLs  PATIENT GOALS: I don't know,  My husband wanted me to come, build up some ms strength  NEXT MD VISIT: as needed  OBJECTIVE:  Note: Objective measures were completed at Evaluation unless otherwise noted.  DIAGNOSTIC FINDINGS:  N/a  PATIENT SURVEYS:  ODI: 30/50=60%  COGNITION: Overall cognitive status: History of cognitive impairments - at baseline     SENSATION: WFL   POSTURE: rounded shoulders, forward head, and decreased lumbar lordosis  PALPATION: Slight TTP about lumbar paraspinals  LUMBAR ROM:   AROM eval  Flexion FT to below patella  Extension Neutral  Right lateral flexion 75% limited P!  Left lateral flexion 50% Limited P!  Right rotation   Left rotation    (Blank rows = not tested)  LOWER EXTREMITY Strength:     MMT Right eval Left eval  Hip flexion 4 4  Hip extension    Hip abduction 5 5  Hip adduction 5 5  Hip internal rotation    Hip external rotation    Knee flexion    Knee extension 4+ 4+  Ankle dorsiflexion 5 4-  Ankle plantarflexion    Ankle inversion    Ankle eversion     (Blank rows = not tested)  LOWER EXTREMITY ROM:    WFL  LUMBAR SPECIAL TESTS:  Slump test: Negative  FUNCTIONAL TESTS:  Item Test date: 10/23/24 Date:  Date:   Sitting to standing 3. able to stand independently using hands Insert SmartPhrase OPRCBERGREEVAL Insert SmartPhrase OPRCBERGREEVAL  2. Standing unsupported 4. able to stand safely for 2 minutes    3. Sitting with back unsupported, feet supported 4. able to sit safely and securely for 2 minutes    4. Standing to sitting 2. uses back of legs against chair to control descent    5. Pivot transfer  3. able to transfer safely with definite need of hands    6. Standing unsupported with eyes closed 3. able to stand 10 seconds with supervision    7. Standing unsupported with feet together 2. able to  place feet together independently but unable to hold for 30 seconds    8. Reaching forward with outstretched arms while standing 2. can reach forward 5 cm (2 inches)    9. Pick up object from the floor  from standing 3. able to pick up slipper but needs supervision    10. Turning to look behind over left and right shoulders while standing 2. turns sideways but only maintains balance    11. Turn 360 degrees 0. needs assistance while turning    12. Place alternate foot on step or stool while standing unsupported 0. needs assistance to keep from falling/unable to try    13. Standing unsupported one foot in front 0. loses balance while stepping or standing    14. Standing on one leg 0. unable to try of needs assist to prevent fall      Total Score 28/56 Total Score:    Total Score:     TUG 34.95   GAIT: Distance walked: 400 ft Assistive device utilized: Walker - 4 wheeled Level of assistance: SBA Comments: using rollator: shortened step length, reduced cadence  TREATMENT  [facility] Adult PT Treatment:                                                DATE: 11/19/24 Pt seen for aquatic therapy today.  Treatment took place in water 3.5-4.75 ft in depth at the [facility] pool. Temp of water was 91.  Pt entered/exited the pool via stairs using step-to pattern with bil rail.   *ue support of barbell: walking forward, backward and side stepping in 4 ft * squatted rest period with UE on wall * UE support on wall: squats x 10; hip add/abd x 10; hip extension x 5 each * return to side stepping with barbell -> marching forward/ backward with barbell *yellow noodle under arms/ behind back; hands on corner: cycling; hip add/abd; hip ext/flexion * return to walking forward/backward with UE on noodle   Pt requires the buoyancy and hydrostatic pressure of water for support, and to offload joints by unweighting joint load by at least 50 % in navel deep water and by at least 75-80% in chest to neck deep  water.  Viscosity of the water is needed for resistance of strengthening. Water current perturbations provides challenge to standing balance requiring increased core activation.                                                                                                                   PATIENT EDUCATION:  Education details: Discussed eval findings, rehab rationale, aquatic program progression/POC and pools in area. Patient is in agreement  Person educated: Patient Education method: Explanation Education comprehension: verbalized understanding  HOME EXERCISE PROGRAM: TBA  ASSESSMENT:  CLINICAL IMPRESSION: Pt initially with limited tolerance for gait in water, regardless of direction, in 4 ft depth.  She reported the most relief with relaxed squat at wall and suspended LE motions (noodle under arms).  Pt reported reduction of pain to 1-2/10 by end of session.   Goals are ongoing.    Initial Impression Patient is  a 78 y.o. f who was seen today for physical therapy evaluation and treatment for Oa multiple joints with LBP being most limiting. Pt is well known to this clinic as she has been seen for other episodes. She is a member here at [facility] and reports coming and using some exercise machines weekly. Patient presents with pain deficits in LB primarily but also hips and knees limiting her (lumbar) ROM, strength, endurance, activity tolerance, gait, balance, and functional mobility with ADL's. She uses rollator at all times in and out of home. Has not had any recent falls. She does have mild cognitive dysfunction. She will benefit from an episode of skilled aquatic PT to improve all areas of deficits, objective impairments as listed below and manage chronic pain.   OBJECTIVE IMPAIRMENTS: Abnormal gait, decreased activity tolerance, decreased balance, decreased coordination, decreased mobility, difficulty walking, decreased ROM, decreased strength, postural dysfunction, and pain.    ACTIVITY LIMITATIONS: carrying, lifting, bending, sitting, standing, squatting, stairs, transfers, locomotion level, and caring for others  PARTICIPATION LIMITATIONS: meal prep, cleaning, laundry, driving, shopping, community activity, and yard work  PERSONAL FACTORS: Time since onset of injury/illness/exacerbation are also affecting patient's functional outcome.   REHAB POTENTIAL: Fair chronicity of condition  CLINICAL DECISION MAKING: Evolving/moderate complexity  EVALUATION COMPLEXITY: Moderate   GOALS: Goals reviewed with patient? Yes  SHORT TERM GOALS: Target date: 11/29/24  Pt will tolerate full aquatic sessions consistently without increase in pain and with improving function to demonstrate good toleration and effectiveness of intervention.  Baseline: Goal status: INITIAL  2.  Pt will tolerate walking to and from setting and engaging in aquatic therapy session without excessive fatigue or increase in pain to demonstrate improved toleration to activity.  Baseline:  Goal status: INITIAL     LONG TERM GOALS: Target date: 01/03/25  Pt to improve on ODI by 13% to demonstrate statistically significant Improvement in function. (MCID 13-15%) Baseline: 30/50=60% Goal status: INITIAL  2.  Pt will improve strength in all areas to 5/5 to demonstrate improved overall physical function Baseline:  Goal status: INITIAL  3.  Pt will improve on Berg balance test to >/= 38/56 to demonstrate a decrease in fall risk. (MDC 7) Baseline: 28/56 Goal status: INITIAL  4.  Pt will improve on Tug test to <or= 22s to demonstrate improvement in lower extremity function, mobility and decreased fall risk. (Geriatric rehab avg @ DC= 21.2) Baseline: 34.95 Goal status: INITIAL  5.  Pt will report decrease in pain by at least 50% for improved toleration to activity/quality of life and to demonstrate improved management of pain. Baseline: see chart Goal status: INITIAL   PLAN:  PT  FREQUENCY: 1x/week  PT DURATION: 10 weeks  likely 8 visits  PLANNED INTERVENTIONS: 97164- PT Re-evaluation, 97750- Physical Performance Testing, 97110-Therapeutic exercises, 97530- Therapeutic activity, 97112- Neuromuscular re-education, 97535- Self Care, 02859- Manual therapy, [phone]- Gait training, [phone]- Aquatic Therapy, [phone]- Ionotophoresis 4mg /ml Dexamethasone , 79439 (1-2 muscles), 20561 (3+ muscles)- Dry Needling, Patient/Family education, Balance training, Stair training, Taping, Joint mobilization, DME instructions, Cryotherapy, and Moist heat.  PLAN FOR NEXT SESSION: aquatic: general strengthening, ROM, stretching; gait, pain management, balance retraining  Delon Aquas, PTA 11/19/2024 4:19 PM [facility] [address] [place], [place], [address] Phone: [phone]   Fax:  [phone]

## 2024-11-21 ENCOUNTER — Ambulatory Visit (HOSPITAL_COMMUNITY): Admission: RE | Admit: 2024-11-21 | Discharge: 2024-11-21 | Attending: Pediatrics | Admitting: Pediatrics

## 2024-11-21 DIAGNOSIS — K7581 Nonalcoholic steatohepatitis (NASH): Secondary | ICD-10-CM | POA: Insufficient documentation

## 2024-11-21 DIAGNOSIS — K219 Gastro-esophageal reflux disease without esophagitis: Secondary | ICD-10-CM | POA: Insufficient documentation

## 2024-11-21 DIAGNOSIS — K5900 Constipation, unspecified: Secondary | ICD-10-CM | POA: Diagnosis present

## 2024-11-22 ENCOUNTER — Telehealth: Payer: Self-pay | Admitting: Pediatrics

## 2024-11-22 NOTE — Telephone Encounter (Signed)
 Attempted to reach patient.  Phone straight to VM.  Left message advising I would call back at 10:30 this morning. 2nd attempted at 10:40.  Sent patient mychart message

## 2024-11-22 NOTE — Telephone Encounter (Signed)
 Spoke with patient and husband who report  constipation for the past week. She has had house guests which have added stress to her daily life. Attempted an enema but it sounds like she was not able to hold it very long. Provided instructions for bowel purge ( sent through mychart) And  to resume Linzess  the next day.  Patient also had stopped fiber so recommended once she is cleaned out to add back fiber and 6-8 glasses of water per day. Patient voiced understanding and will call us  next week if things aren't back on track.

## 2024-11-22 NOTE — Telephone Encounter (Signed)
 Inbound call from patient stating that she is very constipated. She states she has used a enema  and changed diet and it has not helped. Patient is requesting a call back from the nurse to discuss recommendations. Please advise.

## 2024-11-25 ENCOUNTER — Other Ambulatory Visit: Payer: Self-pay | Admitting: Internal Medicine

## 2024-11-26 ENCOUNTER — Ambulatory Visit: Admitting: Internal Medicine

## 2024-11-29 ENCOUNTER — Encounter (HOSPITAL_BASED_OUTPATIENT_CLINIC_OR_DEPARTMENT_OTHER): Payer: Self-pay | Admitting: Physical Therapy

## 2024-11-29 ENCOUNTER — Ambulatory Visit (HOSPITAL_BASED_OUTPATIENT_CLINIC_OR_DEPARTMENT_OTHER): Admitting: Physical Therapy

## 2024-11-29 DIAGNOSIS — M2550 Pain in unspecified joint: Secondary | ICD-10-CM

## 2024-11-29 DIAGNOSIS — M6281 Muscle weakness (generalized): Secondary | ICD-10-CM

## 2024-11-29 DIAGNOSIS — M5459 Other low back pain: Secondary | ICD-10-CM | POA: Diagnosis not present

## 2024-11-29 NOTE — Therapy (Signed)
 " OUTPATIENT PHYSICAL THERAPY THORACOLUMBAR TREATMENT   Patient Name: Terri Rodriguez MRN: [id number] DOB:[birth year], 78 y.o., female Today's Date: 11/29/2024  END OF SESSION:  PT End of Session - 11/29/24 1100     Visit Number 4    Date for Recertification  01/03/25    Authorization Type [employer] [place]    PT Start Time 1100    PT Stop Time 1140    PT Time Calculation (min) 40 min    Behavior During Therapy [facility] for tasks assessed/performed           Past Medical History:  Diagnosis Date   ALLERGIC RHINITIS    Allergy    Anginal pain    Blood transfusion without reported diagnosis    Cataract    small right   Cataract Removal Left eye    Cholelithiasis 06/09/2008   gallstones 2009 on ct scan   Complication of anesthesia 2012   went to ICU after bilateral knees , unstable vital signs oct 2012, has surgery since did ok   Coronary artery disease    30 % narrowing lad   Diverticulitis    EPICONDYLITIS, LATERAL    osteoarthritis, previous joint replacements   Fall 10/2018   GERD    Hepatic hemangioma 06/09/2008   Hiatal hernia 03/2000   Hx of adenomatous polyp of colon 06/05/2008   Hx of cardiac catheterization 2008   clean coronarys DrKelly    Hx of chest pain 2003   neg cath remot hx of narrowwing lad in 2003 nl 2008, none in many years   HYPERLIPIDEMIA    IBS (irritable bowel syndrome)    LEUKOPENIA, CHRONIC    Mononucleosis 1963   RAYNAUD'S SYNDROME, HX OF    improved after cardiac meds initiated   Rosacea    facial   Sleep apnea    SLEEP APNEA, OBSTRUCTIVE    cpap, settings automatic settings 11-12   Subacute thyroiditis    UNSPECIFIED ANEMIA    Past Surgical History:  Procedure Laterality Date   ABDOMINAL HYSTERECTOMY  1993   bso   ANTERIOR CERVICAL DECOMP/DISCECTOMY FUSION N/A 12/20/2022   Procedure: Cervical four-five Cervical five-six Cervical six-seven Anterior Cervical Decompression Fusion;  Surgeon: Colon Shove, MD;  Location: [facility];  Service:  Neurosurgery;  Laterality: N/A;   ANTERIOR FUSION CERVICAL SPINE N/A 12/20/2022   BACK SURGERY  2008   l 3 to l4 l4 to l5   BARTHOLIN GLAND CYST EXCISION Right 09/09/2019   Procedure: EXCISION OF VULVAR MASS TIMES TWO;  Surgeon: Cleotilde Ronal RAMAN, MD;  Location: [facility];  Service: Gynecology;  Laterality: Right;   CARDIAC CATHETERIZATION  04/07/2007   Noncritical coronary artery disease. Contiue medical therapy.   CARDIAC CATHETERIZATION  12/03/2007   Normal LV function. Mild angiographic mitral valve prolapse. Normal coronary arteries.   CARDIOVASCULAR STRESS TEST  11/09/2007   Moderate ischemia in Mid Anterior, Mid Anteroseptal, Apical Anterior, and Apical Septal regions. EKG negative for ischemia.   CATARACT EXTRACTION Right 04/25/2023   CESAREAN SECTION  1985, 1989   COLON RESECTION Left 01/03/2022   Procedure: COLON RESECTION;  Surgeon: Lyndel Deward PARAS, MD;  Location: [facility];  Service: General;  Laterality: Left;   COLOSTOMY  01/03/2022   Procedure: COLOSTOMY;  Surgeon: Lyndel Deward PARAS, MD;  Location: [facility];  Service: General;;   dental implants  11/2002,11/2011,11/2012   dental implants     x 6   DENTAL SURGERY     DILATION AND CURETTAGE  OF UTERUS     d and e 1988 and 1990   JOINT REPLACEMENT  09/21/2011   bilateral knee   KNEE ARTHROSCOPY     bilateral   LAPAROTOMY N/A 01/03/2022   Procedure: EXPLORATORY LAPAROTOMY;  Surgeon: Lyndel Deward PARAS, MD;  Location: [facility];  Service: General;  Laterality: N/A;   lumbar surgery fixation with disc replacement  09/2007   Left   NASAL SEPTOPLASTY W/ TURBINOPLASTY  05/2000   NOCTURNAL POLYSOMNOGRAM  12/31/2006   Severe obstructive sleep apnea. AHI-87/hr   REPLACEMENT TOTAL KNEE  2012   bilateral   TONSILLECTOMY  1952   adenoids too   TOTAL HIP ARTHROPLASTY Left 10/21/2013   Procedure: LEFT TOTAL HIP ARTHROPLASTY ANTERIOR APPROACH;  Surgeon: Dempsey LULLA Moan, MD;  Location: [facility];  Service: Orthopedics;   Laterality: Left;   TOTAL HIP ARTHROPLASTY Right 07/16/2014   Procedure: RIGHT TOTAL HIP ARTHROPLASTY ANTERIOR APPROACH;  Surgeon: Dempsey Moan LULLA, MD;  Location: [facility];  Service: Orthopedics;  Laterality: Right;   TRANSTHORACIC ECHOCARDIOGRAM  11/09/2007   EF 58%, LV systolic function normal. Mild aortic root dilation.   WRIST SURGERY Left 04/03/2014   ORIF distal radial head fracture   XI ROBOTIC ASSISTED COLOSTOMY TAKEDOWN N/A 09/26/2022   Procedure: ROBOTIC OSTOMY REVERSAL;  Surgeon: Lyndel Deward PARAS, MD;  Location: [facility];  Service: General;  Laterality: N/A;   Patient Active Problem List   Diagnosis Date Noted   Cervical myelopathy (HCC) 12/20/2022   History of creation of ostomy (HCC) 09/26/2022   Colostomy in place (HCC) 01/09/2022   IBS (irritable bowel syndrome) 01/05/2022   Physical deconditioning 01/05/2022   Perforation of colon s/p Hartmann colectomy/colostomy 01/03/2022 01/02/2022   Lumbar post-laminectomy syndrome 10/07/2021   Cervical spondylosis without myelopathy 10/07/2021   Primary osteoarthritis of both feet 06/18/2017   Status post bilateral total hip replacement 06/18/2017   Status post total knee replacement, bilateral 06/18/2017   Degenerative disc disease, lumbar status post fusion 06/18/2017   Degenerative disc disease, cervical 06/18/2017   History of cholelithiasis 06/18/2017   Other chronic pain 02/01/2017   Visit for preventive health examination 01/21/2015   Medicare annual wellness visit, subsequent 01/21/2015   BMI 40.0-44.9, adult (HCC) 01/21/2015   Hyperlipidemia LDL goal <70 01/12/2015   Morbid obesity (HCC) 01/12/2015   Hyperglycemia 01/10/2014   Mild CAD 01/06/2014   Medication monitoring encounter 09/22/2012   Hx of cardiac catheterization    Adenomatous polyp of colon 06/14/2012   ROSACEA 04/26/2010   Obstructive sleep apnea 09/09/2009   Normocytic anemia 12/15/2008   LEUKOPENIA, CHRONIC 01/08/2008   Elevated lipids 08/17/2007    ALLERGIC RHINITIS 08/17/2007   GERD 08/17/2007   Primary osteoarthritis of both hands 08/17/2007    PCP: Apolinar Eastern MD  REFERRING PROVIDER: Apolinar Eastern MD  REFERRING DIAG:  M15.9 (ICD-10-CM) - Osteoarthritis of multiple joints, unspecified osteoarthritis type  R53.81 (ICD-10-CM) - Physical deconditioning  M51.369 (ICD-10-CM) - Degeneration of intervertebral disc of lumbar region, unspecified whether pain present  M25.50 (ICD-10-CM) - Multiple joint pain    Rationale for Evaluation and Treatment: Rehabilitation  THERAPY DIAG:  Other low back pain  Multiple joint pain  Muscle weakness (generalized)  ONSET DATE: chronic  SUBJECTIVE:  SUBJECTIVE STATEMENT: Pt reports feeling a good amount of relief after last session.     Initial Subjective Got an injection by Dr Colon a few weeks ago in my left LB and it helped, stopped the pain down my left leg. There has been one thing after another.  My whole body hurts.  I am losing my strength.  I was coming up front at [facility] to exercises 1 x week.    PERTINENT HISTORY:  ACDF 12/20/22, Long history of medical and surgical diagnoses. Memory issues, LBP since surgery 2008, fibromyalgia, B hip and knee replacements, R achilles tear 2022   PAIN:  Are you having pain? Yes: NPRS scale:  mild  Pain location: LB, hips   Pain description: ache Aggravating factors: not having meds Relieving factors: resting, taking meds  PRECAUTIONS: Fall  RED FLAGS: None   WEIGHT BEARING RESTRICTIONS: No  FALLS:  Has patient fallen in last 6 months? No  LIVING ENVIRONMENT: Lives with: lives with their spouse Lives in: House/apartment Stairs: Yes: Internal: 16 steps; on right going up Has following equipment at home: Vannie - 4 wheeled  OCCUPATION:  retired physician (internal medicine)  PLOF: Requires assistive device for independence and Needs assistance with ADLs  PATIENT GOALS: I don't know,  My husband wanted me to come, build up some ms strength  NEXT MD VISIT: as needed  OBJECTIVE:  Note: Objective measures were completed at Evaluation unless otherwise noted.  DIAGNOSTIC FINDINGS:  N/a  PATIENT SURVEYS:  ODI: 30/50=60%  COGNITION: Overall cognitive status: History of cognitive impairments - at baseline     SENSATION: WFL   POSTURE: rounded shoulders, forward head, and decreased lumbar lordosis  PALPATION: Slight TTP about lumbar paraspinals  LUMBAR ROM:   AROM eval  Flexion FT to below patella  Extension Neutral  Right lateral flexion 75% limited P!  Left lateral flexion 50% Limited P!  Right rotation   Left rotation    (Blank rows = not tested)  LOWER EXTREMITY Strength:     MMT Right eval Left eval  Hip flexion 4 4  Hip extension    Hip abduction 5 5  Hip adduction 5 5  Hip internal rotation    Hip external rotation    Knee flexion    Knee extension 4+ 4+  Ankle dorsiflexion 5 4-  Ankle plantarflexion    Ankle inversion    Ankle eversion     (Blank rows = not tested)  LOWER EXTREMITY ROM:    WFL  LUMBAR SPECIAL TESTS:  Slump test: Negative  FUNCTIONAL TESTS:  Item Test date: 10/23/24 Date:  Date:   Sitting to standing 3. able to stand independently using hands Insert SmartPhrase OPRCBERGREEVAL Insert SmartPhrase OPRCBERGREEVAL  2. Standing unsupported 4. able to stand safely for 2 minutes    3. Sitting with back unsupported, feet supported 4. able to sit safely and securely for 2 minutes    4. Standing to sitting 2. uses back of legs against chair to control descent    5. Pivot transfer  3. able to transfer safely with definite need of hands    6. Standing unsupported with eyes closed 3. able to stand 10 seconds with supervision    7. Standing unsupported with feet together 2.  able to place feet together independently but unable to hold for 30 seconds    8. Reaching forward with outstretched arms while standing 2. can reach forward 5 cm (2 inches)    9. Pick up object  from the floor from standing 3. able to pick up slipper but needs supervision    10. Turning to look behind over left and right shoulders while standing 2. turns sideways but only maintains balance    11. Turn 360 degrees 0. needs assistance while turning    12. Place alternate foot on step or stool while standing unsupported 0. needs assistance to keep from falling/unable to try    13. Standing unsupported one foot in front 0. loses balance while stepping or standing    14. Standing on one leg 0. unable to try of needs assist to prevent fall      Total Score 28/56 Total Score:    Total Score:     TUG 34.95   GAIT: Distance walked: 400 ft Assistive device utilized: Walker - 4 wheeled Level of assistance: SBA Comments: using rollator: shortened step length, reduced cadence  TREATMENT  [facility] Adult PT Treatment:                                                DATE: 11/29/24 Pt seen for aquatic therapy today.  Treatment took place in water 3.5-4.75 ft in depth at the [facility] pool. Temp of water was 91.  Pt entered/exited the pool via stairs using step-to pattern with bil rail.   *ue support of barbell: walking forward and backward, cues to not lean back- 4 laps; and side stepping in 4 ft -2laps * squatted rest period with UE on wall * UE support on wall:  hip add/abd x 12; hip extension x 10 each; heel raises x 10; squats x 10; *UE on barbell: high knee marching in place * squatted rest at wall *  return to side stepping  -> marching forward/ backward with barbell *yellow noodle under arms/ behind back; hands on corner: cycling (some coordination issues); hip add/abd; hip ext/flexion * return to walking forward/backward with UE on noodle -> without UE support  Pt requires the buoyancy  and hydrostatic pressure of water for support, and to offload joints by unweighting joint load by at least 50 % in navel deep water and by at least 75-80% in chest to neck deep water.  Viscosity of the water is needed for resistance of strengthening. Water current perturbations provides challenge to standing balance requiring increased core activation.                                                                                                               PATIENT EDUCATION:  Education details:intro to aquatic therapy Person educated: Patient Education method: Explanation Education comprehension: verbalized understanding  HOME EXERCISE PROGRAM: TBA  ASSESSMENT:  CLINICAL IMPRESSION: Pt improved tolerance for gait in water this visit. She continues to report the most relief with relaxed squat at wall and suspended LE motions (noodle under arms). She had some difficulty with motor planning for suspended cycling in corner.  Pt reported reduction of pain again at end of session.  Will plan to include more R hip stretches in upcoming sessions as this hip appears tighter than Lt.  Pt has met STG1.    Initial Impression Patient is a 78 y.o. f who was seen today for physical therapy evaluation and treatment for Oa multiple joints with LBP being most limiting. Pt is well known to this clinic as she has been seen for other episodes. She is a member here at [facility] and reports coming and using some exercise machines weekly. Patient presents with pain deficits in LB primarily but also hips and knees limiting her (lumbar) ROM, strength, endurance, activity tolerance, gait, balance, and functional mobility with ADL's. She uses rollator at all times in and out of home. Has not had any recent falls. She does have mild cognitive dysfunction. She will benefit from an episode of skilled aquatic PT to improve all areas of deficits, objective impairments as listed below and manage chronic pain.   OBJECTIVE  IMPAIRMENTS: Abnormal gait, decreased activity tolerance, decreased balance, decreased coordination, decreased mobility, difficulty walking, decreased ROM, decreased strength, postural dysfunction, and pain.   ACTIVITY LIMITATIONS: carrying, lifting, bending, sitting, standing, squatting, stairs, transfers, locomotion level, and caring for others  PARTICIPATION LIMITATIONS: meal prep, cleaning, laundry, driving, shopping, community activity, and yard work  PERSONAL FACTORS: Time since onset of injury/illness/exacerbation are also affecting patient's functional outcome.   REHAB POTENTIAL: Fair chronicity of condition  CLINICAL DECISION MAKING: Evolving/moderate complexity  EVALUATION COMPLEXITY: Moderate   GOALS: Goals reviewed with patient? Yes  SHORT TERM GOALS: Target date: 11/29/24  Pt will tolerate full aquatic sessions consistently without increase in pain and with improving function to demonstrate good toleration and effectiveness of intervention.  Baseline: Goal status: MET- 11/29/24  2.  Pt will tolerate walking to and from setting and engaging in aquatic therapy session without excessive fatigue or increase in pain to demonstrate improved toleration to activity.  Baseline:  Goal status: INITIAL     LONG TERM GOALS: Target date: 01/03/25  Pt to improve on ODI by 13% to demonstrate statistically significant Improvement in function. (MCID 13-15%) Baseline: 30/50=60% Goal status: INITIAL  2.  Pt will improve strength in all areas to 5/5 to demonstrate improved overall physical function Baseline:  Goal status: INITIAL  3.  Pt will improve on Berg balance test to >/= 38/56 to demonstrate a decrease in fall risk. (MDC 7) Baseline: 28/56 Goal status: INITIAL  4.  Pt will improve on Tug test to <or= 22s to demonstrate improvement in lower extremity function, mobility and decreased fall risk. (Geriatric rehab avg @ DC= 21.2) Baseline: 34.95 Goal status: INITIAL  5.  Pt  will report decrease in pain by at least 50% for improved toleration to activity/quality of life and to demonstrate improved management of pain. Baseline: see chart Goal status: INITIAL   PLAN:  PT FREQUENCY: 1x/week  PT DURATION: 10 weeks  likely 8 visits  PLANNED INTERVENTIONS: [id number]- PT Re-evaluation, 97750- Physical Performance Testing, 97110-Therapeutic exercises, 97530- Therapeutic activity, 97112- Neuromuscular re-education, 97535- Self Care, [id number]- Manual therapy, [phone]- Gait training, [phone]- Aquatic Therapy, [phone]- Ionotophoresis 4mg /ml Dexamethasone , 79439 (1-2 muscles), [phone] (3+ muscles)- Dry Needling, Patient/Family education, Balance training, Stair training, Taping, Joint mobilization, DME instructions, Cryotherapy, and Moist heat.  PLAN FOR NEXT SESSION: aquatic: general strengthening, ROM, stretching; gait, pain management, balance retraining   Delon Aquas, PTA 11/29/2024 11:42 AM [facility] [address] [place],  [place], [address] Phone: [phone]   Fax:  [phone]  "

## 2024-12-10 ENCOUNTER — Ambulatory Visit: Admitting: Internal Medicine

## 2024-12-11 ENCOUNTER — Ambulatory Visit (HOSPITAL_BASED_OUTPATIENT_CLINIC_OR_DEPARTMENT_OTHER): Admitting: Physical Therapy

## 2024-12-12 ENCOUNTER — Other Ambulatory Visit: Payer: Self-pay | Admitting: Internal Medicine

## 2024-12-12 ENCOUNTER — Telehealth: Admitting: Internal Medicine

## 2024-12-12 VITALS — Wt 206.0 lb

## 2024-12-12 DIAGNOSIS — Z79899 Other long term (current) drug therapy: Secondary | ICD-10-CM | POA: Diagnosis not present

## 2024-12-12 DIAGNOSIS — M159 Polyosteoarthritis, unspecified: Secondary | ICD-10-CM

## 2024-12-12 DIAGNOSIS — F324 Major depressive disorder, single episode, in partial remission: Secondary | ICD-10-CM

## 2024-12-12 NOTE — Progress Notes (Unsigned)
 " Virtual Visit via Video Note  I connected with Terri Rodriguez on 12/12/2024 at 11:30 AM EST by a video enabled telemedicine application and verified that I am speaking with the correct person using two identifiers. Location patient: home Location provider:work office Persons participating in the virtual visit: patient, provider   Patient aware  of the limitations of evaluation and management by telemedicine and  availability of in person appointments. and agreed to proceed.   HPI: Terri Rodriguez presents for video visit    ROS: See pertinent positives and negatives per HPI.  Past Medical History:  Diagnosis Date   ALLERGIC RHINITIS    Allergy    Anginal pain    Blood transfusion without reported diagnosis    Cataract    small right   Cataract Removal Left eye    Cholelithiasis 06/09/2008   gallstones 2009 on ct scan   Complication of anesthesia 2012   went to ICU after bilateral knees , unstable vital signs oct 2012, has surgery since did ok   Coronary artery disease    30 % narrowing lad   Diverticulitis    EPICONDYLITIS, LATERAL    osteoarthritis, previous joint replacements   Fall 10/2018   GERD    Hepatic hemangioma 06/09/2008   Hiatal hernia 03/2000   Hx of adenomatous polyp of colon 06/05/2008   Hx of cardiac catheterization 2008   clean coronarys DrKelly    Hx of chest pain 2003   neg cath remot hx of narrowwing lad in 2003 nl 2008, none in many years   HYPERLIPIDEMIA    IBS (irritable bowel syndrome)    LEUKOPENIA, CHRONIC    Mononucleosis 1963   RAYNAUD'S SYNDROME, HX OF    improved after cardiac meds initiated   Rosacea    facial   Sleep apnea    SLEEP APNEA, OBSTRUCTIVE    cpap, settings automatic settings 11-12   Subacute thyroiditis    UNSPECIFIED ANEMIA     Past Surgical History:  Procedure Laterality Date   ABDOMINAL HYSTERECTOMY  1993   bso   ANTERIOR CERVICAL DECOMP/DISCECTOMY FUSION N/A 12/20/2022   Procedure: Cervical four-five  Cervical five-six Cervical six-seven Anterior Cervical Decompression Fusion;  Surgeon: Colon Shove, MD;  Location: [facility];  Service: Neurosurgery;  Laterality: N/A;   ANTERIOR FUSION CERVICAL SPINE N/A 12/20/2022   BACK SURGERY  2008   l 3 to l4 l4 to l5   BARTHOLIN GLAND CYST EXCISION Right 09/09/2019   Procedure: EXCISION OF VULVAR MASS TIMES TWO;  Surgeon: Cleotilde Ronal RAMAN, MD;  Location: [facility];  Service: Gynecology;  Laterality: Right;   CARDIAC CATHETERIZATION  04/07/2007   Noncritical coronary artery disease. Contiue medical therapy.   CARDIAC CATHETERIZATION  12/03/2007   Normal LV function. Mild angiographic mitral valve prolapse. Normal coronary arteries.   CARDIOVASCULAR STRESS TEST  11/09/2007   Moderate ischemia in Mid Anterior, Mid Anteroseptal, Apical Anterior, and Apical Septal regions. EKG negative for ischemia.   CATARACT EXTRACTION Right 04/25/2023   CESAREAN SECTION  1985, 1989   COLON RESECTION Left 01/03/2022   Procedure: COLON RESECTION;  Surgeon: Lyndel Deward PARAS, MD;  Location: [facility];  Service: General;  Laterality: Left;   COLOSTOMY  01/03/2022   Procedure: COLOSTOMY;  Surgeon: Lyndel Deward PARAS, MD;  Location: [facility];  Service: General;;   dental implants  11/2002,11/2011,11/2012   dental implants     x 6   DENTAL SURGERY     DILATION AND  CURETTAGE OF UTERUS     d and e 1988 and 1990   JOINT REPLACEMENT  09/21/2011   bilateral knee   KNEE ARTHROSCOPY     bilateral   LAPAROTOMY N/A 01/03/2022   Procedure: EXPLORATORY LAPAROTOMY;  Surgeon: Lyndel Deward PARAS, MD;  Location: [facility];  Service: General;  Laterality: N/A;   lumbar surgery fixation with disc replacement  09/2007   Left   NASAL SEPTOPLASTY W/ TURBINOPLASTY  05/2000   NOCTURNAL POLYSOMNOGRAM  12/31/2006   Severe obstructive sleep apnea. AHI-87/hr   REPLACEMENT TOTAL KNEE  2012   bilateral   TONSILLECTOMY  1952   adenoids too   TOTAL HIP ARTHROPLASTY Left 10/21/2013    Procedure: LEFT TOTAL HIP ARTHROPLASTY ANTERIOR APPROACH;  Surgeon: Dempsey LULLA Moan, MD;  Location: [facility];  Service: Orthopedics;  Laterality: Left;   TOTAL HIP ARTHROPLASTY Right 07/16/2014   Procedure: RIGHT TOTAL HIP ARTHROPLASTY ANTERIOR APPROACH;  Surgeon: Dempsey Moan LULLA, MD;  Location: [facility];  Service: Orthopedics;  Laterality: Right;   TRANSTHORACIC ECHOCARDIOGRAM  11/09/2007   EF 58%, LV systolic function normal. Mild aortic root dilation.   WRIST SURGERY Left 04/03/2014   ORIF distal radial head fracture   XI ROBOTIC ASSISTED COLOSTOMY TAKEDOWN N/A 09/26/2022   Procedure: ROBOTIC OSTOMY REVERSAL;  Surgeon: Lyndel Deward PARAS, MD;  Location: [facility];  Service: General;  Laterality: N/A;    Family History  Problem Relation Age of Onset   Rheum arthritis Mother    Diabetes Mother    Heart failure Mother    Thrombocytopenia Mother    Sleep apnea Mother    Ulcers Mother        PUD   Deep vein thrombosis Father    Pulmonary embolism Father    Osteoarthritis Father    Atrial fibrillation Father    Dementia Father    Sleep apnea Father    Sleep apnea Brother    Obesity Brother    Sleep apnea Brother    Obesity Brother    Atrial fibrillation Brother    Colon cancer Maternal Aunt 59   Colon cancer Maternal Aunt 90   Congenital adrenal hyperplasia Grandchild    Pancreatic cancer Other    Uterine cancer Other    Leukemia Other    Inflammatory bowel disease Other        aunt   Esophageal cancer Neg Hx    Rectal cancer Neg Hx    Stomach cancer Neg Hx    Colon polyps Neg Hx     Social History[1]   Current Medications[2]  EXAM: BP Readings from Last 3 Encounters:  11/05/24 110/74  10/15/24 108/60  07/10/24 102/60    VITALS per patient if applicable:  GENERAL: alert, oriented, appears well and in no acute distress  HEENT: atraumatic, conjunttiva clear, no obvious abnormalities on inspection of external nose and ears  NECK: normal movements of the head  and neck  LUNGS: on inspection no signs of respiratory distress, breathing rate appears normal, no obvious gross SOB, gasping or wheezing  CV: no obvious cyanosis  MS: moves all visible extremities without noticeable abnormality  PSYCH/NEURO: pleasant and cooperative, no obvious depression or anxiety, speech and thought processing grossly intact Lab Results  Component Value Date   WBC 4.0 11/05/2024   HGB 12.3 11/05/2024   HCT 35.2 (L) 11/05/2024   PLT 225.0 11/05/2024   GLUCOSE 102 (H) 11/05/2024   CHOL 146 01/05/2024   TRIG 80.0 01/05/2024   HDL 77.00 01/05/2024  LDLCALC 53 01/05/2024   ALT 43 (H) 11/05/2024   AST 46 (H) 11/05/2024   NA 141 11/05/2024   K 5.1 11/05/2024   CL 107 11/05/2024   CREATININE 0.74 11/05/2024   BUN 28 (H) 11/05/2024   CO2 29 11/05/2024   TSH 1.33 01/05/2024   INR 1.1 (H) 11/05/2024   HGBA1C 5.6 01/05/2024    ASSESSMENT AND PLAN:  Discussed the following assessment and plan:    ICD-10-CM   1. Major depressive disorder in partial remission, unspecified whether recurrent  F32.4     2. Medication management  Z79.899       Counseled.   Expectant management and discussion of plan and treatment with opportunity to ask questions and all were answered. The patient agreed with the plan and demonstrated an understanding of the instructions.   Advised to call back or seek an in-person evaluation if worsening  or having  further concerns  in interim. No follow-ups on file.    Apolinar Eastern, MD      [1]  Social History Tobacco Use   Smoking status: Never   Smokeless tobacco: Never  Vaping Use   Vaping status: Never Used  Substance Use Topics   Alcohol use: Yes    Alcohol/week: 7.0 standard drinks of alcohol    Types: 7 Glasses of wine per week   Drug use: No  [2]  Current Outpatient Medications:    acetaminophen  (TYLENOL ) 500 MG tablet, Take 1 tablet (500 mg total) by mouth every 6 (six) hours as needed for fever or mild pain., Disp:  30 tablet, Rfl: 0   AMBULATORY NON FORMULARY MEDICATION, Medication Name: Reflux Gourmet, take 1 package as needed., Disp: , Rfl:    amLODipine  (NORVASC ) 5 MG tablet, TAKE 1 TABLET BY MOUTH EVERY DAY, Disp: 90 tablet, Rfl: 3   amoxicillin  (AMOXIL ) 500 MG capsule, Take 2,000 mg by mouth See admin instructions. Take 2000 mg by mouth 1 hour prior to dental procedures., Disp: , Rfl:    aspirin  EC 81 MG tablet, Take 81 mg by mouth daily., Disp: , Rfl:    atorvastatin  (LIPITOR) 20 MG tablet, TAKE 1 TABLET BY MOUTH EVERY DAY, Disp: 90 tablet, Rfl: 3   Azelaic Acid  15 % cream, Apply 1 application topically 2 (two) times daily as needed (rosacea). After skin is thoroughly washed and patted dry, gently but thoroughly massage a thin film of azelaic acid  cream into the affected area t daily,after shower., Disp: , Rfl:    buPROPion  (WELLBUTRIN  XL) 150 MG 24 hr tablet, TAKE 1 TABLET BY MOUTH EVERY DAY, Disp: 90 tablet, Rfl: 1   calcium  elemental as carbonate (TUMS ULTRA 1000) 400 MG chewable tablet, Chew 1,000 mg by mouth daily as needed for heartburn., Disp: , Rfl:    carvedilol  (COREG ) 3.125 MG tablet, TAKE 1 TABLET BY MOUTH TWICE DAILY WITH MEALS, Disp: 180 tablet, Rfl: 3   celecoxib  (CELEBREX ) 200 MG capsule, TAKE 1 CAPSULE BY MOUTH ONCE DAILY. MAY ALTERNATE WITH 100MG , Disp: 90 capsule, Rfl: 0   desvenlafaxine  (PRISTIQ ) 50 MG 24 hr tablet, TAKE 1 TABLET BY MOUTH ONCE DAILY - After weaning off cymbalta , Disp: 90 tablet, Rfl: 3   esomeprazole (NEXIUM) 20 MG capsule, Take 20 mg by mouth daily at 12 noon., Disp: , Rfl:    famotidine  (PEPCID ) 20 MG tablet, Take 1 tablet (20 mg total) by mouth at bedtime., Disp: 90 tablet, Rfl: 3   fexofenadine (ALLEGRA) 180 MG tablet, Take 180 mg by mouth  daily with supper., Disp: , Rfl:    linaclotide  (LINZESS ) 290 MCG CAPS capsule, Take 1 capsule (290 mcg total) by mouth daily before breakfast., Disp: 90 capsule, Rfl: 3   metroNIDAZOLE  (METROGEL ) 1 % gel, Apply 1 Application  topically daily as needed (rosacea)., Disp: , Rfl:    Multiple Vitamin (MULTIVITAMIN WITH MINERALS) TABS tablet, Take 1 tablet by mouth daily. Centrum Women's no Iron , Disp: , Rfl:    nitroGLYCERIN  (NITROSTAT ) 0.4 MG SL tablet, PLACE 1 TABLET UNDER THE TONGUE EVERY 5 MINUTES AS NEEDED FOR CHEST PAIN, Disp: 25 tablet, Rfl: 0   psyllium (REGULOID) 0.52 g capsule, Take 0.52 g by mouth 2 (two) times daily as needed (bowel)., Disp: , Rfl:    ramipril  (ALTACE ) 10 MG capsule, TAKE 1 CAPSULE BY MOUTH EVERY DAY, Disp: 90 capsule, Rfl: 3   valACYclovir  (VALTREX ) 1000 MG tablet, TAKE 2 TABLET BY MOUTH Q12 hours X 2 DOSES WITH FEVER BLISTERS, Disp: 30 tablet, Rfl: 3  "

## 2024-12-14 ENCOUNTER — Encounter: Payer: Self-pay | Admitting: Internal Medicine

## 2024-12-18 ENCOUNTER — Encounter (HOSPITAL_BASED_OUTPATIENT_CLINIC_OR_DEPARTMENT_OTHER): Payer: Self-pay | Admitting: Physical Therapy

## 2024-12-18 ENCOUNTER — Ambulatory Visit (HOSPITAL_BASED_OUTPATIENT_CLINIC_OR_DEPARTMENT_OTHER): Attending: Internal Medicine | Admitting: Physical Therapy

## 2024-12-18 DIAGNOSIS — M5459 Other low back pain: Secondary | ICD-10-CM | POA: Diagnosis present

## 2024-12-18 DIAGNOSIS — M6281 Muscle weakness (generalized): Secondary | ICD-10-CM | POA: Insufficient documentation

## 2024-12-18 DIAGNOSIS — M2550 Pain in unspecified joint: Secondary | ICD-10-CM | POA: Diagnosis present

## 2024-12-18 DIAGNOSIS — M545 Low back pain, unspecified: Secondary | ICD-10-CM | POA: Insufficient documentation

## 2024-12-18 NOTE — Therapy (Signed)
 " OUTPATIENT PHYSICAL THERAPY THORACOLUMBAR TREATMENT   Patient Name: Terri Rodriguez MRN: [id number] DOB:1946/11/14, 79 y.o., female Today's Date: 12/18/2024  END OF SESSION:  PT End of Session - 12/18/24 1325     Visit Number 5    Date for Recertification  01/03/25    Authorization Type [employer] Mc[employer]    PT Start Time 1315    PT Stop Time 1355    PT Time Calculation (min) 40 min    Activity Tolerance Patient tolerated treatment well    Behavior During Therapy [facility] for tasks assessed/performed            Past Medical History:  Diagnosis Date   ALLERGIC RHINITIS    Allergy    Anginal pain    Blood transfusion without reported diagnosis    Cataract    small right   Cataract Removal Left eye    Cholelithiasis 06/09/2008   gallstones 2009 on ct scan   Complication of anesthesia 2012   went to ICU after bilateral knees , unstable vital signs oct 2012, has surgery since did ok   Coronary artery disease    30 % narrowing lad   Diverticulitis    EPICONDYLITIS, LATERAL    osteoarthritis, previous joint replacements   Fall 10/2018   GERD    Hepatic hemangioma 06/09/2008   Hiatal hernia 03/2000   Hx of adenomatous polyp of colon 06/05/2008   Hx of cardiac catheterization 2008   clean coronarys DrKelly    Hx of chest pain 2003   neg cath remot hx of narrowwing lad in 2003 nl 2008, none in many years   HYPERLIPIDEMIA    IBS (irritable bowel syndrome)    LEUKOPENIA, CHRONIC    Mononucleosis 1963   RAYNAUD'S SYNDROME, HX OF    improved after cardiac meds initiated   Rosacea    facial   Sleep apnea    SLEEP APNEA, OBSTRUCTIVE    cpap, settings automatic settings 11-12   Subacute thyroiditis    UNSPECIFIED ANEMIA    Past Surgical History:  Procedure Laterality Date   ABDOMINAL HYSTERECTOMY  1993   bso   ANTERIOR CERVICAL DECOMP/DISCECTOMY FUSION N/A 12/20/2022   Procedure: Cervical four-five Cervical five-six Cervical six-seven Anterior Cervical Decompression Fusion;   Surgeon: Colon Shove, MD;  Location: [facility] OR;  Service: Neurosurgery;  Laterality: N/A;   ANTERIOR FUSION CERVICAL SPINE N/A 12/20/2022   BACK SURGERY  2008   l 3 to l4 l4 to l5   BARTHOLIN GLAND CYST EXCISION Right 09/09/2019   Procedure: EXCISION OF VULVAR MASS TIMES TWO;  Surgeon: Cleotilde Ronal RAMAN, MD;  Location: [facility];  Service: Gynecology;  Laterality: Right;   CARDIAC CATHETERIZATION  04/07/2007   Noncritical coronary artery disease. Contiue medical therapy.   CARDIAC CATHETERIZATION  12/03/2007   Normal LV function. Mild angiographic mitral valve prolapse. Normal coronary arteries.   CARDIOVASCULAR STRESS TEST  11/09/2007   Moderate ischemia in Mid Anterior, Mid Anteroseptal, Apical Anterior, and Apical Septal regions. EKG negative for ischemia.   CATARACT EXTRACTION Right 04/25/2023   CESAREAN SECTION  1985, 1989   COLON RESECTION Left 01/03/2022   Procedure: COLON RESECTION;  Surgeon: Lyndel Deward PARAS, MD;  Location: [facility];  Service: General;  Laterality: Left;   COLOSTOMY  01/03/2022   Procedure: COLOSTOMY;  Surgeon: Lyndel Deward PARAS, MD;  Location: [facility];  Service: General;;   dental implants  11/2002,11/2011,11/2012   dental implants     x 6  DENTAL SURGERY     DILATION AND CURETTAGE OF UTERUS     d and e 55 and 1990   JOINT REPLACEMENT  09/21/2011   bilateral knee   KNEE ARTHROSCOPY     bilateral   LAPAROTOMY N/A 01/03/2022   Procedure: EXPLORATORY LAPAROTOMY;  Surgeon: Lyndel Deward PARAS, MD;  Location: [facility];  Service: General;  Laterality: N/A;   lumbar surgery fixation with disc replacement  09/2007   Left   NASAL SEPTOPLASTY W/ TURBINOPLASTY  05/2000   NOCTURNAL POLYSOMNOGRAM  12/31/2006   Severe obstructive sleep apnea. AHI-87/hr   REPLACEMENT TOTAL KNEE  2012   bilateral   TONSILLECTOMY  1952   adenoids too   TOTAL HIP ARTHROPLASTY Left 10/21/2013   Procedure: LEFT TOTAL HIP ARTHROPLASTY ANTERIOR APPROACH;  Surgeon: Dempsey LULLA Moan, MD;  Location: [facility];  Service: Orthopedics;  Laterality: Left;   TOTAL HIP ARTHROPLASTY Right 07/16/2014   Procedure: RIGHT TOTAL HIP ARTHROPLASTY ANTERIOR APPROACH;  Surgeon: Dempsey Moan LULLA, MD;  Location: [facility];  Service: Orthopedics;  Laterality: Right;   TRANSTHORACIC ECHOCARDIOGRAM  11/09/2007   EF 58%, LV systolic function normal. Mild aortic root dilation.   WRIST SURGERY Left 04/03/2014   ORIF distal radial head fracture   XI ROBOTIC ASSISTED COLOSTOMY TAKEDOWN N/A 09/26/2022   Procedure: ROBOTIC OSTOMY REVERSAL;  Surgeon: Lyndel Deward PARAS, MD;  Location: [facility];  Service: General;  Laterality: N/A;   Patient Active Problem List   Diagnosis Date Noted   Cervical myelopathy (HCC) 12/20/2022   History of creation of ostomy (HCC) 09/26/2022   Colostomy in place (HCC) 01/09/2022   IBS (irritable bowel syndrome) 01/05/2022   Physical deconditioning 01/05/2022   Perforation of colon s/p Hartmann colectomy/colostomy 01/03/2022 01/02/2022   Lumbar post-laminectomy syndrome 10/07/2021   Cervical spondylosis without myelopathy 10/07/2021   Primary osteoarthritis of both feet 06/18/2017   Status post bilateral total hip replacement 06/18/2017   Status post total knee replacement, bilateral 06/18/2017   Degenerative disc disease, lumbar status post fusion 06/18/2017   Degenerative disc disease, cervical 06/18/2017   History of cholelithiasis 06/18/2017   Other chronic pain 02/01/2017   Visit for preventive health examination 01/21/2015   Medicare annual wellness visit, subsequent 01/21/2015   BMI 40.0-44.9, adult (HCC) 01/21/2015   Hyperlipidemia LDL goal <70 01/12/2015   Morbid obesity (HCC) 01/12/2015   Hyperglycemia 01/10/2014   Mild CAD 01/06/2014   Medication monitoring encounter 09/22/2012   Hx of cardiac catheterization    Adenomatous polyp of colon 06/14/2012   ROSACEA 04/26/2010   Obstructive sleep apnea 09/09/2009   Normocytic anemia 12/15/2008    LEUKOPENIA, CHRONIC 01/08/2008   Elevated lipids 08/17/2007   ALLERGIC RHINITIS 08/17/2007   GERD 08/17/2007   Primary osteoarthritis of both hands 08/17/2007    PCP: Apolinar Eastern MD  REFERRING PROVIDER: Apolinar Eastern MD  REFERRING DIAG:  M15.9 (ICD-10-CM) - Osteoarthritis of multiple joints, unspecified osteoarthritis type  R53.81 (ICD-10-CM) - Physical deconditioning  M51.369 (ICD-10-CM) - Degeneration of intervertebral disc of lumbar region, unspecified whether pain present  M25.50 (ICD-10-CM) - Multiple joint pain    Rationale for Evaluation and Treatment: Rehabilitation  THERAPY DIAG:  Other low back pain  Multiple joint pain  Muscle weakness (generalized)  Pain in left lumbar region of back  ONSET DATE: chronic  SUBJECTIVE:  SUBJECTIVE STATEMENT: Pt reports 5/10 LBP.     Initial Subjective Got an injection by Dr Colon a few weeks ago in my left LB and it helped, stopped the pain down my left leg. There has been one thing after another.  My whole body hurts.  I am losing my strength.  I was coming up front at [facility] to exercises 1 x week.    PERTINENT HISTORY:  ACDF 12/20/22, Long history of medical and surgical diagnoses. Memory issues, LBP since surgery 2008, fibromyalgia, B hip and knee replacements, R achilles tear 2022   PAIN:  Are you having pain? Yes: NPRS scale:  mild  Pain location: LB, hips   Pain description: ache Aggravating factors: not having meds Relieving factors: resting, taking meds  PRECAUTIONS: Fall  RED FLAGS: None   WEIGHT BEARING RESTRICTIONS: No  FALLS:  Has patient fallen in last 6 months? No  LIVING ENVIRONMENT: Lives with: lives with their spouse Lives in: House/apartment Stairs: Yes: Internal: 16 steps; on right going up Has following  equipment at home: Vannie - 4 wheeled  OCCUPATION: retired physician (internal medicine)  PLOF: Requires assistive device for independence and Needs assistance with ADLs  PATIENT GOALS: I don't know,  My husband wanted me to come, build up some ms strength  NEXT MD VISIT: as needed  OBJECTIVE:  Note: Objective measures were completed at Evaluation unless otherwise noted.  DIAGNOSTIC FINDINGS:  N/a  PATIENT SURVEYS:  ODI: 30/50=60%  COGNITION: Overall cognitive status: History of cognitive impairments - at baseline     SENSATION: WFL   POSTURE: rounded shoulders, forward head, and decreased lumbar lordosis  PALPATION: Slight TTP about lumbar paraspinals  LUMBAR ROM:   AROM eval  Flexion FT to below patella  Extension Neutral  Right lateral flexion 75% limited P!  Left lateral flexion 50% Limited P!  Right rotation   Left rotation    (Blank rows = not tested)  LOWER EXTREMITY Strength:     MMT Right eval Left eval  Hip flexion 4 4  Hip extension    Hip abduction 5 5  Hip adduction 5 5  Hip internal rotation    Hip external rotation    Knee flexion    Knee extension 4+ 4+  Ankle dorsiflexion 5 4-  Ankle plantarflexion    Ankle inversion    Ankle eversion     (Blank rows = not tested)  LOWER EXTREMITY ROM:    WFL  LUMBAR SPECIAL TESTS:  Slump test: Negative  FUNCTIONAL TESTS:  Item Test date: 10/23/24 Date:  Date:   Sitting to standing 3. able to stand independently using hands Insert SmartPhrase OPRCBERGREEVAL Insert SmartPhrase OPRCBERGREEVAL  2. Standing unsupported 4. able to stand safely for 2 minutes    3. Sitting with back unsupported, feet supported 4. able to sit safely and securely for 2 minutes    4. Standing to sitting 2. uses back of legs against chair to control descent    5. Pivot transfer  3. able to transfer safely with definite need of hands    6. Standing unsupported with eyes closed 3. able to stand 10 seconds with  supervision    7. Standing unsupported with feet together 2. able to place feet together independently but unable to hold for 30 seconds    8. Reaching forward with outstretched arms while standing 2. can reach forward 5 cm (2 inches)    9. Pick up object from the floor from standing 3. able  to pick up slipper but needs supervision    10. Turning to look behind over left and right shoulders while standing 2. turns sideways but only maintains balance    11. Turn 360 degrees 0. needs assistance while turning    12. Place alternate foot on step or stool while standing unsupported 0. needs assistance to keep from falling/unable to try    13. Standing unsupported one foot in front 0. loses balance while stepping or standing    14. Standing on one leg 0. unable to try of needs assist to prevent fall      Total Score 28/56 Total Score:    Total Score:     TUG 34.95   GAIT: Distance walked: 400 ft Assistive device utilized: Walker - 4 wheeled Level of assistance: SBA Comments: using rollator: shortened step length, reduced cadence  TREATMENT  [facility] Adult PT Treatment:                                                DATE: 12/18/24 Pt seen for aquatic therapy today.  Treatment took place in water 3.5-4.75 ft in depth at the [facility] pool. Temp of water was 91.  Pt entered/exited the pool via stairs using step-to pattern with bil rail.   *ue support of barbell: walking forward and backward and side stepping  * squatted rest period with UE on wall * UE support on wall:  hip add/abd 2 x 5; hip extension x 10 each; heel raises x 10; squats x 10; *squatted rest period *UE on barbell: high knee marching in place * squatted rest at wall *L stretch *marching forward/ backward with barbell *yellow noodle wrapped anteriorly hands on corner: cycling (improved coordination with positional change of noodle); hip add/abd; hip ext/flexion   Pt requires the buoyancy and hydrostatic pressure of  water for support, and to offload joints by unweighting joint load by at least 50 % in navel deep water and by at least 75-80% in chest to neck deep water.  Viscosity of the water is needed for resistance of strengthening. Water current perturbations provides challenge to standing balance requiring increased core activation.                                                                                                               PATIENT EDUCATION:  Education details:intro to aquatic therapy Person educated: Patient Education method: Explanation Education comprehension: verbalized understanding  HOME EXERCISE PROGRAM: TBA  ASSESSMENT:  CLINICAL IMPRESSION: Increased submersion to 4.2 ft for exercise and amb to reduce loading and pain in LB with fairly good response.  Changed position of noodle to anteriorly wrapped to complete decompression and cycling with improved coordination/execution of exercises in position.  She reports LBP relief with vertical suspension.  She does require multiple squatted rest periods throughout and maintains a positive  attitude despite challenges.  Goals ongoing *  Will plan to include more R hip stretches in upcoming sessions as this hip appears tighter than Lt. STG1.      Initial Impression Patient is a 79 y.o. f who was seen today for physical therapy evaluation and treatment for Oa multiple joints with LBP being most limiting. Pt is well known to this clinic as she has been seen for other episodes. She is a member here at [facility] and reports coming and using some exercise machines weekly. Patient presents with pain deficits in LB primarily but also hips and knees limiting her (lumbar) ROM, strength, endurance, activity tolerance, gait, balance, and functional mobility with ADL's. She uses rollator at all times in and out of home. Has not had any recent falls. She does have mild cognitive dysfunction. She will benefit from an episode of skilled aquatic PT to  improve all areas of deficits, objective impairments as listed below and manage chronic pain.   OBJECTIVE IMPAIRMENTS: Abnormal gait, decreased activity tolerance, decreased balance, decreased coordination, decreased mobility, difficulty walking, decreased ROM, decreased strength, postural dysfunction, and pain.   ACTIVITY LIMITATIONS: carrying, lifting, bending, sitting, standing, squatting, stairs, transfers, locomotion level, and caring for others  PARTICIPATION LIMITATIONS: meal prep, cleaning, laundry, driving, shopping, community activity, and yard work  PERSONAL FACTORS: Time since onset of injury/illness/exacerbation are also affecting patient's functional outcome.   REHAB POTENTIAL: Fair chronicity of condition  CLINICAL DECISION MAKING: Evolving/moderate complexity  EVALUATION COMPLEXITY: Moderate   GOALS: Goals reviewed with patient? Yes  SHORT TERM GOALS: Target date: 11/29/24  Pt will tolerate full aquatic sessions consistently without increase in pain and with improving function to demonstrate good toleration and effectiveness of intervention.  Baseline: Goal status: MET- 11/29/24  2.  Pt will tolerate walking to and from setting and engaging in aquatic therapy session without excessive fatigue or increase in pain to demonstrate improved toleration to activity.  Baseline:  Goal status: INITIAL     LONG TERM GOALS: Target date: 01/03/25  Pt to improve on ODI by 13% to demonstrate statistically significant Improvement in function. (MCID 13-15%) Baseline: 30/50=60% Goal status: INITIAL  2.  Pt will improve strength in all areas to 5/5 to demonstrate improved overall physical function Baseline:  Goal status: INITIAL  3.  Pt will improve on Berg balance test to >/= 38/56 to demonstrate a decrease in fall risk. (MDC 7) Baseline: 28/56 Goal status: INITIAL  4.  Pt will improve on Tug test to <or= 22s to demonstrate improvement in lower extremity function,  mobility and decreased fall risk. (Geriatric rehab avg @ DC= 21.2) Baseline: 34.95 Goal status: INITIAL  5.  Pt will report decrease in pain by at least 50% for improved toleration to activity/quality of life and to demonstrate improved management of pain. Baseline: see chart Goal status: INITIAL   PLAN:  PT FREQUENCY: 1x/week  PT DURATION: 10 weeks  likely 8 visits  PLANNED INTERVENTIONS: 97164- PT Re-evaluation, 97750- Physical Performance Testing, 97110-Therapeutic exercises, 97530- Therapeutic activity, 97112- Neuromuscular re-education, 97535- Self Care, 02859- Manual therapy, [phone]- Gait training, [phone]- Aquatic Therapy, [phone]- Ionotophoresis 4mg /ml Dexamethasone , 79439 (1-2 muscles), 20561 (3+ muscles)- Dry Needling, Patient/Family education, Balance training, Stair training, Taping, Joint mobilization, DME instructions, Cryotherapy, and Moist heat.  PLAN FOR NEXT SESSION: aquatic: general strengthening, ROM, stretching; gait, pain management, balance retraining   Ronal Foots) [place] MPT 12/18/2024 1:31 PM [facility] [address] [place], [place], [address] Phone: [phone]   Fax:  [phone]   "

## 2024-12-25 ENCOUNTER — Encounter (HOSPITAL_BASED_OUTPATIENT_CLINIC_OR_DEPARTMENT_OTHER): Payer: Self-pay | Admitting: Physical Therapy

## 2024-12-25 ENCOUNTER — Ambulatory Visit (HOSPITAL_BASED_OUTPATIENT_CLINIC_OR_DEPARTMENT_OTHER): Admitting: Physical Therapy

## 2024-12-25 DIAGNOSIS — M5459 Other low back pain: Secondary | ICD-10-CM | POA: Diagnosis not present

## 2024-12-25 DIAGNOSIS — M2550 Pain in unspecified joint: Secondary | ICD-10-CM

## 2024-12-25 DIAGNOSIS — M6281 Muscle weakness (generalized): Secondary | ICD-10-CM

## 2024-12-25 NOTE — Therapy (Addendum)
 " OUTPATIENT PHYSICAL THERAPY THORACOLUMBAR TREATMENT   Patient Name: Terri Rodriguez MRN: [id number] DOB:[birth year], 79 y.o., female Today's Date: 12/25/2024  END OF SESSION:  PT End of Session - 12/25/24 1326     Visit Number 6    Date for Recertification  01/03/25    Authorization Type [employer] Mc[employer]    PT Start Time 1315    PT Stop Time 1355    PT Time Calculation (min) 40 min    Activity Tolerance Patient tolerated treatment well    Behavior During Therapy [facility] for tasks assessed/performed            Past Medical History:  Diagnosis Date   ALLERGIC RHINITIS    Allergy    Anginal pain    Blood transfusion without reported diagnosis    Cataract    small right   Cataract Removal Left eye    Cholelithiasis 06/09/2008   gallstones 2009 on ct scan   Complication of anesthesia 2012   went to ICU after bilateral knees , unstable vital signs oct 2012, has surgery since did ok   Coronary artery disease    30 % narrowing lad   Diverticulitis    EPICONDYLITIS, LATERAL    osteoarthritis, previous joint replacements   Fall 10/2018   GERD    Hepatic hemangioma 06/09/2008   Hiatal hernia 03/2000   Hx of adenomatous polyp of colon 06/05/2008   Hx of cardiac catheterization 2008   clean coronarys DrKelly    Hx of chest pain 2003   neg cath remot hx of narrowwing lad in 2003 nl 2008, none in many years   HYPERLIPIDEMIA    IBS (irritable bowel syndrome)    LEUKOPENIA, CHRONIC    Mononucleosis 1963   RAYNAUD'S SYNDROME, HX OF    improved after cardiac meds initiated   Rosacea    facial   Sleep apnea    SLEEP APNEA, OBSTRUCTIVE    cpap, settings automatic settings 11-12   Subacute thyroiditis    UNSPECIFIED ANEMIA    Past Surgical History:  Procedure Laterality Date   ABDOMINAL HYSTERECTOMY  1993   bso   ANTERIOR CERVICAL DECOMP/DISCECTOMY FUSION N/A 12/20/2022   Procedure: Cervical four-five Cervical five-six Cervical six-seven Anterior Cervical Decompression Fusion;   Surgeon: Colon Shove, MD;  Location: [facility];  Service: Neurosurgery;  Laterality: N/A;   ANTERIOR FUSION CERVICAL SPINE N/A 12/20/2022   BACK SURGERY  2008   l 3 to l4 l4 to l5   BARTHOLIN GLAND CYST EXCISION Right 09/09/2019   Procedure: EXCISION OF VULVAR MASS TIMES TWO;  Surgeon: Cleotilde Ronal RAMAN, MD;  Location: [facility];  Service: Gynecology;  Laterality: Right;   CARDIAC CATHETERIZATION  04/07/2007   Noncritical coronary artery disease. Contiue medical therapy.   CARDIAC CATHETERIZATION  12/03/2007   Normal LV function. Mild angiographic mitral valve prolapse. Normal coronary arteries.   CARDIOVASCULAR STRESS TEST  11/09/2007   Moderate ischemia in Mid Anterior, Mid Anteroseptal, Apical Anterior, and Apical Septal regions. EKG negative for ischemia.   CATARACT EXTRACTION Right 04/25/2023   CESAREAN SECTION  1985, 1989   COLON RESECTION Left 01/03/2022   Procedure: COLON RESECTION;  Surgeon: Lyndel Deward PARAS, MD;  Location: [facility];  Service: General;  Laterality: Left;   COLOSTOMY  01/03/2022   Procedure: COLOSTOMY;  Surgeon: Lyndel Deward PARAS, MD;  Location: [facility];  Service: General;;   dental implants  11/2002,11/2011,11/2012   dental implants     x 6  DENTAL SURGERY     DILATION AND CURETTAGE OF UTERUS     d and e 66 and 1990   JOINT REPLACEMENT  09/21/2011   bilateral knee   KNEE ARTHROSCOPY     bilateral   LAPAROTOMY N/A 01/03/2022   Procedure: EXPLORATORY LAPAROTOMY;  Surgeon: Lyndel Deward PARAS, MD;  Location: [facility];  Service: General;  Laterality: N/A;   lumbar surgery fixation with disc replacement  09/2007   Left   NASAL SEPTOPLASTY W/ TURBINOPLASTY  05/2000   NOCTURNAL POLYSOMNOGRAM  12/31/2006   Severe obstructive sleep apnea. AHI-87/hr   REPLACEMENT TOTAL KNEE  2012   bilateral   TONSILLECTOMY  1952   adenoids too   TOTAL HIP ARTHROPLASTY Left 10/21/2013   Procedure: LEFT TOTAL HIP ARTHROPLASTY ANTERIOR APPROACH;  Surgeon: Dempsey LULLA Moan, MD;  Location: [facility];  Service: Orthopedics;  Laterality: Left;   TOTAL HIP ARTHROPLASTY Right 07/16/2014   Procedure: RIGHT TOTAL HIP ARTHROPLASTY ANTERIOR APPROACH;  Surgeon: Dempsey Moan LULLA, MD;  Location: [facility];  Service: Orthopedics;  Laterality: Right;   TRANSTHORACIC ECHOCARDIOGRAM  11/09/2007   EF 58%, LV systolic function normal. Mild aortic root dilation.   WRIST SURGERY Left 04/03/2014   ORIF distal radial head fracture   XI ROBOTIC ASSISTED COLOSTOMY TAKEDOWN N/A 09/26/2022   Procedure: ROBOTIC OSTOMY REVERSAL;  Surgeon: Lyndel Deward PARAS, MD;  Location: [facility];  Service: General;  Laterality: N/A;   Patient Active Problem List   Diagnosis Date Noted   Cervical myelopathy (HCC) 12/20/2022   History of creation of ostomy (HCC) 09/26/2022   Colostomy in place (HCC) 01/09/2022   IBS (irritable bowel syndrome) 01/05/2022   Physical deconditioning 01/05/2022   Perforation of colon s/p Hartmann colectomy/colostomy 01/03/2022 01/02/2022   Lumbar post-laminectomy syndrome 10/07/2021   Cervical spondylosis without myelopathy 10/07/2021   Primary osteoarthritis of both feet 06/18/2017   Status post bilateral total hip replacement 06/18/2017   Status post total knee replacement, bilateral 06/18/2017   Degenerative disc disease, lumbar status post fusion 06/18/2017   Degenerative disc disease, cervical 06/18/2017   History of cholelithiasis 06/18/2017   Other chronic pain 02/01/2017   Visit for preventive health examination 01/21/2015   Medicare annual wellness visit, subsequent 01/21/2015   BMI 40.0-44.9, adult (HCC) 01/21/2015   Hyperlipidemia LDL goal <70 01/12/2015   Morbid obesity (HCC) 01/12/2015   Hyperglycemia 01/10/2014   Mild CAD 01/06/2014   Medication monitoring encounter 09/22/2012   Hx of cardiac catheterization    Adenomatous polyp of colon 06/14/2012   ROSACEA 04/26/2010   Obstructive sleep apnea 09/09/2009   Normocytic anemia 12/15/2008    LEUKOPENIA, CHRONIC 01/08/2008   Elevated lipids 08/17/2007   ALLERGIC RHINITIS 08/17/2007   GERD 08/17/2007   Primary osteoarthritis of both hands 08/17/2007    PCP: Apolinar Eastern MD  REFERRING PROVIDER: Apolinar Eastern MD  REFERRING DIAG:  M15.9 (ICD-10-CM) - Osteoarthritis of multiple joints, unspecified osteoarthritis type  R53.81 (ICD-10-CM) - Physical deconditioning  M51.369 (ICD-10-CM) - Degeneration of intervertebral disc of lumbar region, unspecified whether pain present  M25.50 (ICD-10-CM) - Multiple joint pain    Rationale for Evaluation and Treatment: Rehabilitation  THERAPY DIAG:  Other low back pain  Multiple joint pain  Muscle weakness (generalized)  ONSET DATE: chronic  SUBJECTIVE:  SUBJECTIVE STATEMENT: Pt reports 5/10 LBP.  She reports good response to aquatic sessions thus far.  Has been amb using rollator to and from setting without any limitation or excessive fatigue   Initial Subjective Got an injection by Dr Colon a few weeks ago in my left LB and it helped, stopped the pain down my left leg. There has been one thing after another.  My whole body hurts.  I am losing my strength.  I was coming up front at [facility] to exercises 1 x week.    PERTINENT HISTORY:  ACDF 12/20/22, Long history of medical and surgical diagnoses. Memory issues, LBP since surgery 2008, fibromyalgia, B hip and knee replacements, R achilles tear 2022   PAIN:  Are you having pain? Yes: NPRS scale:  mild  Pain location: LB, hips   Pain description: ache Aggravating factors: not having meds Relieving factors: resting, taking meds  PRECAUTIONS: Fall  RED FLAGS: None   WEIGHT BEARING RESTRICTIONS: No  FALLS:  Has patient fallen in last 6 months? No  LIVING ENVIRONMENT: Lives with: lives  with their spouse Lives in: House/apartment Stairs: Yes: Internal: 16 steps; on right going up Has following equipment at home: Vannie - 4 wheeled  OCCUPATION: retired physician (internal medicine)  PLOF: Requires assistive device for independence and Needs assistance with ADLs  PATIENT GOALS: I don't know,  My husband wanted me to come, build up some ms strength  NEXT MD VISIT: as needed  OBJECTIVE:  Note: Objective measures were completed at Evaluation unless otherwise noted.  DIAGNOSTIC FINDINGS:  N/a  PATIENT SURVEYS:  ODI: 30/50=60%  COGNITION: Overall cognitive status: History of cognitive impairments - at baseline     SENSATION: WFL   POSTURE: rounded shoulders, forward head, and decreased lumbar lordosis  PALPATION: Slight TTP about lumbar paraspinals  LUMBAR ROM:   AROM eval  Flexion FT to below patella  Extension Neutral  Right lateral flexion 75% limited P!  Left lateral flexion 50% Limited P!  Right rotation   Left rotation    (Blank rows = not tested)  LOWER EXTREMITY Strength:     MMT Right eval Left eval  Hip flexion 4 4  Hip extension    Hip abduction 5 5  Hip adduction 5 5  Hip internal rotation    Hip external rotation    Knee flexion    Knee extension 4+ 4+  Ankle dorsiflexion 5 4-  Ankle plantarflexion    Ankle inversion    Ankle eversion     (Blank rows = not tested)  LOWER EXTREMITY ROM:    WFL  LUMBAR SPECIAL TESTS:  Slump test: Negative  FUNCTIONAL TESTS:  Item Test date: 10/23/24 Date:  Date:   Sitting to standing 3. able to stand independently using hands Insert SmartPhrase OPRCBERGREEVAL Insert SmartPhrase OPRCBERGREEVAL  2. Standing unsupported 4. able to stand safely for 2 minutes    3. Sitting with back unsupported, feet supported 4. able to sit safely and securely for 2 minutes    4. Standing to sitting 2. uses back of legs against chair to control descent    5. Pivot transfer  3. able to transfer safely  with definite need of hands    6. Standing unsupported with eyes closed 3. able to stand 10 seconds with supervision    7. Standing unsupported with feet together 2. able to place feet together independently but unable to hold for 30 seconds    8. Reaching forward with outstretched arms  while standing 2. can reach forward 5 cm (2 inches)    9. Pick up object from the floor from standing 3. able to pick up slipper but needs supervision    10. Turning to look behind over left and right shoulders while standing 2. turns sideways but only maintains balance    11. Turn 360 degrees 0. needs assistance while turning    12. Place alternate foot on step or stool while standing unsupported 0. needs assistance to keep from falling/unable to try    13. Standing unsupported one foot in front 0. loses balance while stepping or standing    14. Standing on one leg 0. unable to try of needs assist to prevent fall      Total Score 28/56 Total Score:    Total Score:     TUG 34.95   GAIT: Distance walked: 400 ft Assistive device utilized: Walker - 4 wheeled Level of assistance: SBA Comments: using rollator: shortened step length, reduced cadence  TREATMENT  [facility] Adult PT Treatment:                                                DATE: 12/25/24 Pt seen for aquatic therapy today.  Treatment took place in water 3.5-4.75 ft in depth at the [facility] pool. Temp of water was 91.  Pt entered/exited the pool via stairs using step-to pattern with bil rail.   *ue support of barbell: walking forward and backward and side stepping  * squatted rest period with UE on wall *L stretch *squatted rest period * UE support on wall: toe raises; heel raises; hip add/abd 2 x 5; hip extension x 10 each;  *seated on lift: cycling; hip add/abd; LAQ *HS and gastroc stretches on 2nd step *quad stretches against wall Squatted rest period *walking forward and back between exercises for recovery  Pt requires the  buoyancy and hydrostatic pressure of water for support, and to offload joints by unweighting joint load by at least 50 % in navel deep water and by at least 75-80% in chest to neck deep water.  Viscosity of the water is needed for resistance of strengthening. Water current perturbations provides challenge to standing balance requiring increased core activation.                                                                                                               PATIENT EDUCATION:  Education details:intro to aquatic therapy Person educated: Patient Education method: Explanation Education comprehension: verbalized understanding  HOME EXERCISE PROGRAM: TBA  ASSESSMENT:  CLINICAL IMPRESSION: Pt appears slightly more pain sensitive in left sided LB today with session requiring additional rest periods. Added right hip stretching as tolerated. Pt provided vc and demonstration throughout for engagement and stretching of focused muscle. STG #1 met .Goals ongoing       Initial Impression Patient is a 79 y.o. f who was  seen today for physical therapy evaluation and treatment for Oa multiple joints with LBP being most limiting. Pt is well known to this clinic as she has been seen for other episodes. She is a member here at [facility] and reports coming and using some exercise machines weekly. Patient presents with pain deficits in LB primarily but also hips and knees limiting her (lumbar) ROM, strength, endurance, activity tolerance, gait, balance, and functional mobility with ADL's. She uses rollator at all times in and out of home. Has not had any recent falls. She does have mild cognitive dysfunction. She will benefit from an episode of skilled aquatic PT to improve all areas of deficits, objective impairments as listed below and manage chronic pain.   OBJECTIVE IMPAIRMENTS: Abnormal gait, decreased activity tolerance, decreased balance, decreased coordination, decreased mobility, difficulty  walking, decreased ROM, decreased strength, postural dysfunction, and pain.   ACTIVITY LIMITATIONS: carrying, lifting, bending, sitting, standing, squatting, stairs, transfers, locomotion level, and caring for others  PARTICIPATION LIMITATIONS: meal prep, cleaning, laundry, driving, shopping, community activity, and yard work  PERSONAL FACTORS: Time since onset of injury/illness/exacerbation are also affecting patient's functional outcome.   REHAB POTENTIAL: Fair chronicity of condition  CLINICAL DECISION MAKING: Evolving/moderate complexity  EVALUATION COMPLEXITY: Moderate   GOALS: Goals reviewed with patient? Yes  SHORT TERM GOALS: Target date: 11/29/24  Pt will tolerate full aquatic sessions consistently without increase in pain and with improving function to demonstrate good toleration and effectiveness of intervention.  Baseline: Goal status: MET- 11/29/24  2.  Pt will tolerate walking to and from setting and engaging in aquatic therapy session without excessive fatigue or increase in pain to demonstrate improved toleration to activity.  Baseline:  Goal status: Met (consistently) 12/25/24     LONG TERM GOALS: Target date: 01/03/25  Pt to improve on ODI by 13% to demonstrate statistically significant Improvement in function. (MCID 13-15%) Baseline: 30/50=60% Goal status: INITIAL  2.  Pt will improve strength in all areas to 5/5 to demonstrate improved overall physical function Baseline:  Goal status: INITIAL  3.  Pt will improve on Berg balance test to >/= 38/56 to demonstrate a decrease in fall risk. (MDC 7) Baseline: 28/56 Goal status: INITIAL  4.  Pt will improve on Tug test to <or= 22s to demonstrate improvement in lower extremity function, mobility and decreased fall risk. (Geriatric rehab avg @ DC= 21.2) Baseline: 34.95 Goal status: INITIAL  5.  Pt will report decrease in pain by at least 50% for improved toleration to activity/quality of life and to  demonstrate improved management of pain. Baseline: see chart Goal status: INITIAL   PLAN:  PT FREQUENCY: 1x/week  PT DURATION: 10 weeks  likely 8 visits  PLANNED INTERVENTIONS: 97164- PT Re-evaluation, 97750- Physical Performance Testing, 97110-Therapeutic exercises, 97530- Therapeutic activity, 97112- Neuromuscular re-education, 97535- Self Care, 02859- Manual therapy, [phone]- Gait training, [phone]- Aquatic Therapy, [phone]- Ionotophoresis 4mg /ml Dexamethasone , 79439 (1-2 muscles), 20561 (3+ muscles)- Dry Needling, Patient/Family education, Balance training, Stair training, Taping, Joint mobilization, DME instructions, Cryotherapy, and Moist heat.  PLAN FOR NEXT SESSION: aquatic: general strengthening, ROM, stretching; gait, pain management, balance retraining   Ronal Foots) Carmello Cabiness MPT 12/25/24 1:27 PM [facility] [address] [place], [place], [address] Phone: [phone]   Fax:  [phone]  Addend Ronal Foots) Rashida Ladouceur MPT 01/01/25 1:29 PM [facility] [address] [place], [place], [address] Phone: [phone]   Fax:  [phone]   "

## 2024-12-31 ENCOUNTER — Encounter: Payer: Self-pay | Admitting: Pediatrics

## 2024-12-31 DIAGNOSIS — K5904 Chronic idiopathic constipation: Secondary | ICD-10-CM

## 2025-01-01 ENCOUNTER — Encounter (HOSPITAL_BASED_OUTPATIENT_CLINIC_OR_DEPARTMENT_OTHER): Payer: Self-pay | Admitting: Physical Therapy

## 2025-01-01 ENCOUNTER — Ambulatory Visit (HOSPITAL_BASED_OUTPATIENT_CLINIC_OR_DEPARTMENT_OTHER): Admitting: Physical Therapy

## 2025-01-01 DIAGNOSIS — M2550 Pain in unspecified joint: Secondary | ICD-10-CM

## 2025-01-01 DIAGNOSIS — M5459 Other low back pain: Secondary | ICD-10-CM

## 2025-01-01 DIAGNOSIS — M6281 Muscle weakness (generalized): Secondary | ICD-10-CM

## 2025-01-01 NOTE — Therapy (Signed)
 " OUTPATIENT PHYSICAL THERAPY THORACOLUMBAR TREATMENT  Progress Note / re-cert Reporting Period 10/23/24 to 01/01/25  See note below for Objective Data and Assessment of Progress/Goals.      Patient Name: Terri Rodriguez MRN: [id number] DOB:August 02, 1946, 79 y.o., female Today's Date: 01/01/2025  END OF SESSION:  PT End of Session - 01/01/25 1357     Visit Number 7    Date for Recertification  01/03/25    Authorization Type [employer] Mc[employer]    PT Start Time 1320    PT Stop Time 1400    PT Time Calculation (min) 40 min    Activity Tolerance Patient tolerated treatment well    Behavior During Therapy [facility] for tasks assessed/performed             Past Medical History:  Diagnosis Date   ALLERGIC RHINITIS    Allergy    Anginal pain    Blood transfusion without reported diagnosis    Cataract    small right   Cataract Removal Left eye    Cholelithiasis 06/09/2008   gallstones 2009 on ct scan   Complication of anesthesia 2012   went to ICU after bilateral knees , unstable vital signs oct 2012, has surgery since did ok   Coronary artery disease    30 % narrowing lad   Diverticulitis    EPICONDYLITIS, LATERAL    osteoarthritis, previous joint replacements   Fall 10/2018   GERD    Hepatic hemangioma 06/09/2008   Hiatal hernia 03/2000   Hx of adenomatous polyp of colon 06/05/2008   Hx of cardiac catheterization 2008   clean coronarys DrKelly    Hx of chest pain 2003   neg cath remot hx of narrowwing lad in 2003 nl 2008, none in many years   HYPERLIPIDEMIA    IBS (irritable bowel syndrome)    LEUKOPENIA, CHRONIC    Mononucleosis 1963   RAYNAUD'S SYNDROME, HX OF    improved after cardiac meds initiated   Rosacea    facial   Sleep apnea    SLEEP APNEA, OBSTRUCTIVE    cpap, settings automatic settings 11-12   Subacute thyroiditis    UNSPECIFIED ANEMIA    Past Surgical History:  Procedure Laterality Date   ABDOMINAL HYSTERECTOMY  1993   bso   ANTERIOR CERVICAL  DECOMP/DISCECTOMY FUSION N/A 12/20/2022   Procedure: Cervical four-five Cervical five-six Cervical six-seven Anterior Cervical Decompression Fusion;  Surgeon: Colon Shove, MD;  Location: [facility] OR;  Service: Neurosurgery;  Laterality: N/A;   ANTERIOR FUSION CERVICAL SPINE N/A 12/20/2022   BACK SURGERY  2008   l 3 to l4 l4 to l5   BARTHOLIN GLAND CYST EXCISION Right 09/09/2019   Procedure: EXCISION OF VULVAR MASS TIMES TWO;  Surgeon: Cleotilde Ronal RAMAN, MD;  Location: [facility];  Service: Gynecology;  Laterality: Right;   CARDIAC CATHETERIZATION  04/07/2007   Noncritical coronary artery disease. Contiue medical therapy.   CARDIAC CATHETERIZATION  12/03/2007   Normal LV function. Mild angiographic mitral valve prolapse. Normal coronary arteries.   CARDIOVASCULAR STRESS TEST  11/09/2007   Moderate ischemia in Mid Anterior, Mid Anteroseptal, Apical Anterior, and Apical Septal regions. EKG negative for ischemia.   CATARACT EXTRACTION Right 04/25/2023   CESAREAN SECTION  1985, 1989   COLON RESECTION Left 01/03/2022   Procedure: COLON RESECTION;  Surgeon: Lyndel Deward PARAS, MD;  Location: [facility];  Service: General;  Laterality: Left;   COLOSTOMY  01/03/2022   Procedure: COLOSTOMY;  Surgeon: Lyndel Deward PARAS,  MD;  Location: [facility];  Service: General;;   dental implants  11/2002,11/2011,11/2012   dental implants     x 6   DENTAL SURGERY     DILATION AND CURETTAGE OF UTERUS     d and e 1988 and 1990   JOINT REPLACEMENT  09/21/2011   bilateral knee   KNEE ARTHROSCOPY     bilateral   LAPAROTOMY N/A 01/03/2022   Procedure: EXPLORATORY LAPAROTOMY;  Surgeon: Lyndel Deward PARAS, MD;  Location: [facility];  Service: General;  Laterality: N/A;   lumbar surgery fixation with disc replacement  09/2007   Left   NASAL SEPTOPLASTY W/ TURBINOPLASTY  05/2000   NOCTURNAL POLYSOMNOGRAM  12/31/2006   Severe obstructive sleep apnea. AHI-87/hr   REPLACEMENT TOTAL KNEE  2012   bilateral    TONSILLECTOMY  1952   adenoids too   TOTAL HIP ARTHROPLASTY Left 10/21/2013   Procedure: LEFT TOTAL HIP ARTHROPLASTY ANTERIOR APPROACH;  Surgeon: Dempsey LULLA Moan, MD;  Location: [facility];  Service: Orthopedics;  Laterality: Left;   TOTAL HIP ARTHROPLASTY Right 07/16/2014   Procedure: RIGHT TOTAL HIP ARTHROPLASTY ANTERIOR APPROACH;  Surgeon: Dempsey Moan LULLA, MD;  Location: [facility];  Service: Orthopedics;  Laterality: Right;   TRANSTHORACIC ECHOCARDIOGRAM  11/09/2007   EF 58%, LV systolic function normal. Mild aortic root dilation.   WRIST SURGERY Left 04/03/2014   ORIF distal radial head fracture   XI ROBOTIC ASSISTED COLOSTOMY TAKEDOWN N/A 09/26/2022   Procedure: ROBOTIC OSTOMY REVERSAL;  Surgeon: Lyndel Deward PARAS, MD;  Location: [facility];  Service: General;  Laterality: N/A;   Patient Active Problem List   Diagnosis Date Noted   Cervical myelopathy (HCC) 12/20/2022   History of creation of ostomy (HCC) 09/26/2022   Colostomy in place (HCC) 01/09/2022   IBS (irritable bowel syndrome) 01/05/2022   Physical deconditioning 01/05/2022   Perforation of colon s/p Hartmann colectomy/colostomy 01/03/2022 01/02/2022   Lumbar post-laminectomy syndrome 10/07/2021   Cervical spondylosis without myelopathy 10/07/2021   Primary osteoarthritis of both feet 06/18/2017   Status post bilateral total hip replacement 06/18/2017   Status post total knee replacement, bilateral 06/18/2017   Degenerative disc disease, lumbar status post fusion 06/18/2017   Degenerative disc disease, cervical 06/18/2017   History of cholelithiasis 06/18/2017   Other chronic pain 02/01/2017   Visit for preventive health examination 01/21/2015   Medicare annual wellness visit, subsequent 01/21/2015   BMI 40.0-44.9, adult (HCC) 01/21/2015   Hyperlipidemia LDL goal <70 01/12/2015   Morbid obesity (HCC) 01/12/2015   Hyperglycemia 01/10/2014   Mild CAD 01/06/2014   Medication monitoring encounter 09/22/2012   Hx of cardiac  catheterization    Adenomatous polyp of colon 06/14/2012   ROSACEA 04/26/2010   Obstructive sleep apnea 09/09/2009   Normocytic anemia 12/15/2008   LEUKOPENIA, CHRONIC 01/08/2008   Elevated lipids 08/17/2007   ALLERGIC RHINITIS 08/17/2007   GERD 08/17/2007   Primary osteoarthritis of both hands 08/17/2007    PCP: Apolinar Eastern MD  REFERRING PROVIDER: Apolinar Eastern MD  REFERRING DIAG:  M15.9 (ICD-10-CM) - Osteoarthritis of multiple joints, unspecified osteoarthritis type  R53.81 (ICD-10-CM) - Physical deconditioning  M51.369 (ICD-10-CM) - Degeneration of intervertebral disc of lumbar region, unspecified whether pain present  M25.50 (ICD-10-CM) - Multiple joint pain    Rationale for Evaluation and Treatment: Rehabilitation  THERAPY DIAG:  Other low back pain  Multiple joint pain  Muscle weakness (generalized)  ONSET DATE: chronic  SUBJECTIVE:  SUBJECTIVE STATEMENT: Pt reports 1/10 LBP.  I do well after the sessions just a little tired  Husband reports pt has had some improvements with ability to get around and in balance.  He states he is intending on bringing her up here to [facility] during open swim on Tues and Thurs if he can get her motivated going forward   Initial Subjective Got an injection by Dr Colon a few weeks ago in my left LB and it helped, stopped the pain down my left leg. There has been one thing after another.  My whole body hurts.  I am losing my strength.  I was coming up front at [facility] to exercises 1 x week.    PERTINENT HISTORY:  ACDF 12/20/22, Long history of medical and surgical diagnoses. Memory issues, LBP since surgery 2008, fibromyalgia, B hip and knee replacements, R achilles tear 2022   PAIN:  Are you having pain? Yes: NPRS scale:  mild  Pain location: LB,  hips   Pain description: ache Aggravating factors: not having meds Relieving factors: resting, taking meds  PRECAUTIONS: Fall  RED FLAGS: None   WEIGHT BEARING RESTRICTIONS: No  FALLS:  Has patient fallen in last 6 months? No  LIVING ENVIRONMENT: Lives with: lives with their spouse Lives in: House/apartment Stairs: Yes: Internal: 16 steps; on right going up Has following equipment at home: Vannie - 4 wheeled  OCCUPATION: retired physician (internal medicine)  PLOF: Requires assistive device for independence and Needs assistance with ADLs  PATIENT GOALS: I don't know,  My husband wanted me to come, build up some ms strength  NEXT MD VISIT: as needed  OBJECTIVE:  Note: Objective measures were completed at Evaluation unless otherwise noted.  DIAGNOSTIC FINDINGS:  N/a  PATIENT SURVEYS:  ODI: 30/50=60%  COGNITION: Overall cognitive status: History of cognitive impairments - at baseline     SENSATION: WFL   POSTURE: rounded shoulders, forward head, and decreased lumbar lordosis  PALPATION: Slight TTP about lumbar paraspinals  LUMBAR ROM:   AROM eval  Flexion FT to below patella  Extension Neutral  Right lateral flexion 75% limited P!  Left lateral flexion 50% Limited P!  Right rotation   Left rotation    (Blank rows = not tested)  LOWER EXTREMITY Strength:     MMT Right eval Left eval R / L 01/01/25  Hip flexion 4 4 4  to 4+  Hip extension     Hip abduction 5 5   Hip adduction 5 5   Hip internal rotation     Hip external rotation     Knee flexion     Knee extension 4+ 4+ 4+  Ankle dorsiflexion 5 4-   Ankle plantarflexion     Ankle inversion     Ankle eversion      (Blank rows = not tested)  LOWER EXTREMITY ROM:    WFL  LUMBAR SPECIAL TESTS:  Slump test: Negative  FUNCTIONAL TESTS:  Item Test date: 10/23/24 Date: 01/01/25 Date:   Sitting to standing 3. able to stand independently using hands 3. able to stand independently using hands   4. able to stand safely for 2 minutes  4. able to sit safely and securely for 2 minutes   3. controls descent by using hands  3. able to transfer safely with definite need of hands  3. able to stand 10 seconds with supervision  2. able to place feet together independently but unable to hold for 30 seconds  3. can reach  forward 12 cm (5 inches)    3. able to pick up slipper but needs supervision  3. looks behind one side only, other side shows less weight shift    1. needs close supervision or verbal cuing  0. needs assistance to keep from falling/unable to try    0. loses balance while stepping or standing   0. unable to try of needs assist to prevent fall     Insert SmartPhrase OPRCBERGREEVAL  2. Standing unsupported 4. able to stand safely for 2 minutes    3. Sitting with back unsupported, feet supported 4. able to sit safely and securely for 2 minutes    4. Standing to sitting 2. uses back of legs against chair to control descent    5. Pivot transfer  3. able to transfer safely with definite need of hands    6. Standing unsupported with eyes closed 3. able to stand 10 seconds with supervision    7. Standing unsupported with feet together 2. able to place feet together independently but unable to hold for 30 seconds    8. Reaching forward with outstretched arms while standing 2. can reach forward 5 cm (2 inches)    9. Pick up object from the floor from standing 3. able to pick up slipper but needs supervision    10. Turning to look behind over left and right shoulders while standing 2. turns sideways but only maintains balance    11. Turn 360 degrees 0. needs assistance while turning    12. Place alternate foot on step or stool while standing unsupported 0. needs assistance to keep from falling/unable to try    13. Standing unsupported one foot in front 0. loses balance while stepping or standing    14. Standing on one leg 0. unable to try of needs assist to prevent fall       Total Score 28/56 Total Score:   32/56 Total Score:     TUG 34.95   GAIT: Distance walked: 400 ft Assistive device utilized: Walker - 4 wheeled Level of assistance: SBA Comments: using rollator: shortened step length, reduced cadence  TREATMENT  [facility] Adult PT Treatment:                                                DATE: 01/01/25 Pt seen for aquatic therapy today.  Treatment took place in water 3.5-4.75 ft in depth at the [facility] pool. Temp of water was 91.  Pt entered/exited the pool via stairs using step-to pattern with bil rail.   *ue support of barbell: walking forward and backward and side stepping  * squatted rest period with UE  *L stretch  - squatted rest period * UE support on barbell: toe raises; heel raises x 15; hip add/abd 2 x 5; hip flex x 10 each;  *yellow noodle wrapped anteriorly around chest, ue support corner wall: hip add/abd-> decompression *TrA engagement pulling 1/2 noodle down wide stance x 10 reps, then staggered x5  - squatted rest peroods *seated on lift: cycling; hip add/abd; LAQ *HS and gastroc stretches on 2nd step *quad stretches against wall Squatted rest period *walking forward and back between exercises for recovery  Pt requires the buoyancy and hydrostatic pressure of water for support, and to offload joints by unweighting joint load by at least 50 % in navel deep water  and by at least 75-80% in chest to neck deep water.  Viscosity of the water is needed for resistance of strengthening. Water current perturbations provides challenge to standing balance requiring increased core activation.                                                                                                               PATIENT EDUCATION:  Education details:intro to aquatic therapy Person educated: Patient Education method: Explanation Education comprehension: verbalized understanding  HOME EXERCISE PROGRAM: TBA  ASSESSMENT:  CLINICAL  IMPRESSION: PN /Re-cert: Pt has been seen x 7 sessions with some breaks in weeks due to holidays and inclement weather. She has demonstrates some improvements in hip strength and balance and indicated in above charts.  She reports minimal pain sensitivity but states it is something she has lived with for years. Husband/CG reports he has seen some improvements in home environment even with just 5-6 sessions in aquatics and has plans to try to get her motivated in future to come to [facility] during open swim for completion of HEP (TBA).  She requires VC and demonstration throughout sessions for execution of exercises and frequent redirection for time on task.  She will continue to benefit from skilled aquatic PT intervention to progress towards meeting all goals set.        Initial Impression Patient is a 79 y.o. f who was seen today for physical therapy evaluation and treatment for Oa multiple joints with LBP being most limiting. Pt is well known to this clinic as she has been seen for other episodes. She is a member here at [facility] and reports coming and using some exercise machines weekly. Patient presents with pain deficits in LB primarily but also hips and knees limiting her (lumbar) ROM, strength, endurance, activity tolerance, gait, balance, and functional mobility with ADL's. She uses rollator at all times in and out of home. Has not had any recent falls. She does have mild cognitive dysfunction. She will benefit from an episode of skilled aquatic PT to improve all areas of deficits, objective impairments as listed below and manage chronic pain.   OBJECTIVE IMPAIRMENTS: Abnormal gait, decreased activity tolerance, decreased balance, decreased coordination, decreased mobility, difficulty walking, decreased ROM, decreased strength, postural dysfunction, and pain.   ACTIVITY LIMITATIONS: carrying, lifting, bending, sitting, standing, squatting, stairs, transfers, locomotion level, and caring for  others  PARTICIPATION LIMITATIONS: meal prep, cleaning, laundry, driving, shopping, community activity, and yard work  PERSONAL FACTORS: Time since onset of injury/illness/exacerbation are also affecting patient's functional outcome.   REHAB POTENTIAL: Fair chronicity of condition  CLINICAL DECISION MAKING: Evolving/moderate complexity  EVALUATION COMPLEXITY: Moderate   GOALS: Goals reviewed with patient? Yes  SHORT TERM GOALS: Target date: 11/29/24  Pt will tolerate full aquatic sessions consistently without increase in pain and with improving function to demonstrate good toleration and effectiveness of intervention.  Baseline: Goal status: MET- 11/29/24  2.  Pt will tolerate walking to and from setting and engaging in aquatic therapy session without excessive fatigue or increase  in pain to demonstrate improved toleration to activity.  Baseline:  Goal status: Met (consistently) 12/25/24     LONG TERM GOALS: Target date: 03/14/25  Pt to improve on ODI by 13% to demonstrate statistically significant Improvement in function. (MCID 13-15%) Baseline: 30/50=60% Goal status: INITIAL  2.  Pt will improve strength in all areas to 5/5 to demonstrate improved overall physical function Baseline:  Goal status: In progress 01/01/25  3.  Pt will improve on Berg balance test to >/= 38/56 to demonstrate a decrease in fall risk. (MDC 7) Baseline: 28/56 Goal status: In progress 01/01/25  4.  Pt will improve on Tug test to <or= 22s to demonstrate improvement in lower extremity function, mobility and decreased fall risk. (Geriatric rehab avg @ DC= 21.2) Baseline: 34.95 Goal status: INITIAL  5.  Pt will report decrease in pain by at least 50% for improved toleration to activity/quality of life and to demonstrate improved management of pain. Baseline: see chart Goal status: In progress 01/01/25   PLAN:  PT FREQUENCY: 1x/week  PT DURATION: 10 weeks    PLANNED INTERVENTIONS: [id number]- PT  Re-evaluation, 97750- Physical Performance Testing, 97110-Therapeutic exercises, 97530- Therapeutic activity, 97112- Neuromuscular re-education, 97535- Self Care, 02859- Manual therapy, [phone]- Gait training, [phone]- Aquatic Therapy, [phone]- Ionotophoresis 4mg /ml Dexamethasone , 79439 (1-2 muscles), 20561 (3+ muscles)- Dry Needling, Patient/Family education, Balance training, Stair training, Taping, Joint mobilization, DME instructions, Cryotherapy, and Moist heat.  PLAN FOR NEXT SESSION: aquatic: general strengthening, ROM, stretching; gait, pain management, balance retraining   Ronal Foots) Ashraf Mesta MPT 01/01/25 2:11 PM [facility] [address] [place], [place], [address] Phone: [phone]   Fax:  [phone]   "

## 2025-01-02 ENCOUNTER — Other Ambulatory Visit: Payer: Self-pay

## 2025-01-02 MED ORDER — TRULANCE 3 MG PO TABS: 3.0000 mg | ORAL_TABLET | Freq: Every day | ORAL | Status: AC

## 2025-01-08 MED ORDER — TRULANCE 3 MG PO TABS: 3.0000 mg | ORAL_TABLET | Freq: Every day | ORAL | 3 refills | Status: AC

## 2025-01-08 NOTE — Addendum Note (Signed)
 Addended by: KOLEEN PERKINS F on: 01/08/2025 11:57 AM   Modules accepted: Orders

## 2025-01-10 ENCOUNTER — Ambulatory Visit (HOSPITAL_BASED_OUTPATIENT_CLINIC_OR_DEPARTMENT_OTHER): Payer: Self-pay | Attending: Internal Medicine | Admitting: Physical Therapy

## 2025-01-21 ENCOUNTER — Ambulatory Visit (HOSPITAL_BASED_OUTPATIENT_CLINIC_OR_DEPARTMENT_OTHER): Payer: Self-pay | Admitting: Physical Therapy

## 2025-02-04 ENCOUNTER — Ambulatory Visit (HOSPITAL_BASED_OUTPATIENT_CLINIC_OR_DEPARTMENT_OTHER): Payer: Self-pay | Admitting: Physical Therapy

## 2025-02-11 ENCOUNTER — Ambulatory Visit (HOSPITAL_BASED_OUTPATIENT_CLINIC_OR_DEPARTMENT_OTHER): Payer: Self-pay | Admitting: Physical Therapy

## 2025-02-18 ENCOUNTER — Ambulatory Visit (HOSPITAL_BASED_OUTPATIENT_CLINIC_OR_DEPARTMENT_OTHER): Payer: Self-pay | Admitting: Physical Therapy

## 2025-02-25 ENCOUNTER — Ambulatory Visit (HOSPITAL_BASED_OUTPATIENT_CLINIC_OR_DEPARTMENT_OTHER): Payer: Self-pay | Admitting: Physical Therapy

## 2025-03-04 ENCOUNTER — Ambulatory Visit (HOSPITAL_BASED_OUTPATIENT_CLINIC_OR_DEPARTMENT_OTHER): Payer: Self-pay | Admitting: Physical Therapy

## 2025-04-15 ENCOUNTER — Ambulatory Visit: Admitting: Internal Medicine

## 2025-04-17 ENCOUNTER — Encounter (HOSPITAL_BASED_OUTPATIENT_CLINIC_OR_DEPARTMENT_OTHER): Admitting: Pulmonary Disease
